# Patient Record
Sex: Female | Born: 1955 | Race: White | Hispanic: No | Marital: Married | Smoking: Former smoker
Health system: Southern US, Community
[De-identification: ages and names within clinical notes are randomized; demographics above are authoritative.]

## PROBLEM LIST (undated history)

## (undated) ENCOUNTER — Emergency Department (HOSPITAL_COMMUNITY): Disposition: A | Discharge status: Home / Self Care | Payer: BLUE CROSS/BLUE SHIELD

## (undated) DIAGNOSIS — E785 Hyperlipidemia, unspecified: Secondary | ICD-10-CM

## (undated) DIAGNOSIS — Z9889 Other specified postprocedural states: Secondary | ICD-10-CM

## (undated) DIAGNOSIS — I739 Peripheral vascular disease, unspecified: Secondary | ICD-10-CM

## (undated) DIAGNOSIS — T7840XA Allergy, unspecified, initial encounter: Secondary | ICD-10-CM

## (undated) DIAGNOSIS — K219 Gastro-esophageal reflux disease without esophagitis: Secondary | ICD-10-CM

## (undated) DIAGNOSIS — Z954 Presence of other heart-valve replacement: Secondary | ICD-10-CM

## (undated) DIAGNOSIS — Z8489 Family history of other specified conditions: Secondary | ICD-10-CM

## (undated) DIAGNOSIS — Z8701 Personal history of pneumonia (recurrent): Secondary | ICD-10-CM

## (undated) DIAGNOSIS — I350 Nonrheumatic aortic (valve) stenosis: Secondary | ICD-10-CM

## (undated) DIAGNOSIS — M1990 Unspecified osteoarthritis, unspecified site: Secondary | ICD-10-CM

## (undated) DIAGNOSIS — M6281 Muscle weakness (generalized): Secondary | ICD-10-CM

## (undated) DIAGNOSIS — I509 Heart failure, unspecified: Secondary | ICD-10-CM

## (undated) DIAGNOSIS — I10 Essential (primary) hypertension: Secondary | ICD-10-CM

## (undated) DIAGNOSIS — F419 Anxiety disorder, unspecified: Secondary | ICD-10-CM

## (undated) DIAGNOSIS — G4730 Sleep apnea, unspecified: Secondary | ICD-10-CM

## (undated) DIAGNOSIS — G4700 Insomnia, unspecified: Secondary | ICD-10-CM

## (undated) DIAGNOSIS — I351 Nonrheumatic aortic (valve) insufficiency: Secondary | ICD-10-CM

## (undated) DIAGNOSIS — Q230 Congenital stenosis of aortic valve: Secondary | ICD-10-CM

## (undated) DIAGNOSIS — I82409 Acute embolism and thrombosis of unspecified deep veins of unspecified lower extremity: Secondary | ICD-10-CM

## (undated) DIAGNOSIS — K559 Vascular disorder of intestine, unspecified: Secondary | ICD-10-CM

## (undated) HISTORY — DX: Muscle weakness (generalized): M62.81

## (undated) HISTORY — DX: Hyperlipidemia, unspecified: E78.5

## (undated) HISTORY — DX: Insomnia, unspecified: G47.00

## (undated) HISTORY — DX: Peripheral vascular disease, unspecified: I73.9

## (undated) HISTORY — DX: Essential (primary) hypertension: I10

## (undated) HISTORY — PX: CHOLECYSTECTOMY: SHX55

## (undated) HISTORY — DX: Gastro-esophageal reflux disease without esophagitis: K21.9

## (undated) HISTORY — DX: Acute embolism and thrombosis of unspecified deep veins of unspecified lower extremity: I82.409

## (undated) HISTORY — DX: Congenital stenosis of aortic valve: Q23.0

## (undated) HISTORY — DX: Nonrheumatic aortic (valve) stenosis: I35.0

## (undated) HISTORY — PX: CERVICAL FUSION: SHX112

## (undated) HISTORY — DX: Nonrheumatic aortic (valve) insufficiency: I35.1

## (undated) HISTORY — PX: ROTATOR CUFF REPAIR: SHX139

## (undated) HISTORY — DX: Heart failure, unspecified: I50.9

## (undated) HISTORY — DX: Allergy, unspecified, initial encounter: T78.40XA

---
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
## 2006-05-30 ENCOUNTER — Ambulatory Visit: Payer: Self-pay | Admitting: Vascular Surgery

## 2006-11-27 ENCOUNTER — Ambulatory Visit: Payer: Self-pay | Admitting: Vascular Surgery

## 2007-12-10 ENCOUNTER — Ambulatory Visit: Payer: Self-pay | Admitting: Vascular Surgery

## 2007-12-24 HISTORY — PX: ILIAC ARTERY STENT: SHX1786

## 2008-01-13 ENCOUNTER — Ambulatory Visit: Payer: Self-pay | Admitting: Vascular Surgery

## 2008-01-13 ENCOUNTER — Ambulatory Visit (HOSPITAL_COMMUNITY): Admission: RE | Admit: 2008-01-13 | Discharge: 2008-01-13 | Payer: Self-pay | Admitting: Vascular Surgery

## 2008-02-04 ENCOUNTER — Ambulatory Visit: Payer: Self-pay | Admitting: Vascular Surgery

## 2008-08-18 ENCOUNTER — Ambulatory Visit: Payer: Self-pay | Admitting: Vascular Surgery

## 2009-02-18 ENCOUNTER — Ambulatory Visit: Payer: Self-pay | Admitting: Vascular Surgery

## 2009-06-04 ENCOUNTER — Ambulatory Visit: Payer: Self-pay | Admitting: Cardiology

## 2009-06-07 ENCOUNTER — Encounter: Payer: Self-pay | Admitting: Cardiology

## 2009-09-09 ENCOUNTER — Ambulatory Visit: Payer: Self-pay | Admitting: Vascular Surgery

## 2009-10-20 ENCOUNTER — Ambulatory Visit (HOSPITAL_COMMUNITY): Admission: RE | Admit: 2009-10-20 | Discharge: 2009-10-20 | Payer: Self-pay | Admitting: General Surgery

## 2009-11-18 ENCOUNTER — Encounter (HOSPITAL_COMMUNITY): Admission: RE | Admit: 2009-11-18 | Discharge: 2009-12-18 | Payer: Self-pay | Admitting: General Surgery

## 2009-12-08 ENCOUNTER — Telehealth (INDEPENDENT_AMBULATORY_CARE_PROVIDER_SITE_OTHER): Payer: Self-pay

## 2009-12-08 ENCOUNTER — Encounter (INDEPENDENT_AMBULATORY_CARE_PROVIDER_SITE_OTHER): Payer: Self-pay

## 2009-12-09 ENCOUNTER — Telehealth (INDEPENDENT_AMBULATORY_CARE_PROVIDER_SITE_OTHER): Payer: Self-pay

## 2009-12-10 ENCOUNTER — Ambulatory Visit: Payer: Self-pay | Admitting: Internal Medicine

## 2009-12-10 ENCOUNTER — Ambulatory Visit (HOSPITAL_COMMUNITY): Admission: RE | Admit: 2009-12-10 | Discharge: 2009-12-10 | Payer: Self-pay | Admitting: Internal Medicine

## 2009-12-28 ENCOUNTER — Ambulatory Visit: Payer: Self-pay | Admitting: Internal Medicine

## 2009-12-28 ENCOUNTER — Encounter (INDEPENDENT_AMBULATORY_CARE_PROVIDER_SITE_OTHER): Payer: Self-pay

## 2009-12-28 DIAGNOSIS — K5900 Constipation, unspecified: Secondary | ICD-10-CM | POA: Insufficient documentation

## 2009-12-28 DIAGNOSIS — R112 Nausea with vomiting, unspecified: Secondary | ICD-10-CM | POA: Insufficient documentation

## 2009-12-28 DIAGNOSIS — R197 Diarrhea, unspecified: Secondary | ICD-10-CM | POA: Insufficient documentation

## 2009-12-29 ENCOUNTER — Encounter: Payer: Self-pay | Admitting: Internal Medicine

## 2009-12-29 ENCOUNTER — Telehealth (INDEPENDENT_AMBULATORY_CARE_PROVIDER_SITE_OTHER): Payer: Self-pay

## 2009-12-30 ENCOUNTER — Encounter: Payer: Self-pay | Admitting: Internal Medicine

## 2010-01-07 ENCOUNTER — Telehealth (INDEPENDENT_AMBULATORY_CARE_PROVIDER_SITE_OTHER): Payer: Self-pay

## 2010-01-14 LAB — CONVERTED CEMR LAB
ALT: 10 units/L (ref 0–35)
AST: 13 units/L (ref 0–37)
Albumin: 3.7 g/dL (ref 3.5–5.2)
Alkaline Phosphatase: 41 units/L (ref 39–117)
Basophils Relative: 1 % (ref 0–1)
HCT: 35.7 % — ABNORMAL LOW (ref 36.0–46.0)
Hemoglobin: 11.4 g/dL — ABNORMAL LOW (ref 12.0–15.0)
Lymphs Abs: 2.3 10*3/uL (ref 0.7–4.0)
MCHC: 31.9 g/dL (ref 30.0–36.0)
MCV: 85.6 fL (ref 78.0–100.0)
Monocytes Absolute: 0.7 10*3/uL (ref 0.1–1.0)
Neutrophils Relative %: 52 % (ref 43–77)
Platelets: 313 10*3/uL (ref 150–400)

## 2010-05-24 NOTE — Letter (Signed)
Summary: Out of Work Note  [facility]  [address]   [place], [place] [address]   Phone: [phone]  Fax: [phone]    12/28/2009  TO: WHOM IT MAY CONCERN  RE: Michelle Patton [address] [place] 18-Feb-1956       The above named individual had a procedure done on 8/19/2011and is currently under my care and will be out of work:    FROM: 12/28/2009   THROUGH: 12/29/2009    MAY RETURN ON:12/30/2009     If you have any further questions or need additional information, please call.     Sincerely,     [facility] R. Roetta Sessions, M.D.    Jonette Eva, M.D. Lorenza Burton, FNP-BC    Tana Coast, PA-C Phone: [phone]    Fax: [phone]

## 2010-05-24 NOTE — Miscellaneous (Signed)
Summary: Orders Update  Clinical Lists Changes  Problems: Added new problem of NAUSEA (ICD-787.02) Orders: Added new Test order of T-Hepatic Function [phone]) - Signed Added new Test order of T-CBC w/Diff [phone]) - Signed Added new Test order of T-Lipase [phone]) - Signed

## 2010-05-24 NOTE — Progress Notes (Signed)
Summary: instructions for procedure  ---- Converted from flag ---- ---- 12/09/2009 11:03 AM, Jonathon Bellows MD, Caleen Essex wrote: no solid food after 8 pm - night before; may have all the clear liquids she wants until 8 am , morning of procedure; then nothing until EGD  ---- 12/09/2009 9:52 AM, Hendricks Limes LPN wrote: this pt is scheduled for friday for egd at 130pm. pt stated she goes to bed around 830pm.If shes NPO from her normal bedtime untill procedure, that is 17 hours. Does she need to be NPO that long and  How many hours does she need to be NPO prior to egd?  thanks ------------------------------

## 2010-05-24 NOTE — Letter (Signed)
Summary: RADIOLOGY REPORT U/S GALLBLADDER  RADIOLOGY REPORT U/S GALLBLADDER   Imported By: Rexene Alberts 12/29/2009 11:30:24  _____________________________________________________________________  External Attachment:    Type:   Image     Comment:   External Document  Appended Document: RADIOLOGY REPORT U/S GALLBLADDER noted; if nausea persists; need lipase lfts and cbc done now  Appended Document: RADIOLOGY REPORT U/S GALLBLADDER Pt is still having nausea. She will go for the labs. Lab order being faxed to [facility]

## 2010-05-24 NOTE — Progress Notes (Signed)
Summary: aciphex refill  Phone Note Call from Patient Call back at Home Phone [phone]   Caller: Patient Summary of Call: pt wants refill on aciphex sent to [facility] Initial call taken by: Hendricks Limes LPN,  January 07, 2010 11:26 AM     Appended Document: aciphex refill    Prescriptions: ACIPHEX 20 MG TBEC (RABEPRAZOLE SODIUM) once daily  #30 x 11   Entered and Authorized by:   Leanna Battles. Dixon Boos   Signed by:   Leanna Battles Dixon Boos on 01/07/2010   Method used:   Electronically to        [facility] (retail)       [address]       [place], [place]  [address]       Ph: [phone]       Fax: [phone]   RxID:   [id number]

## 2010-05-24 NOTE — Miscellaneous (Signed)
Summary: HIDA scan, H&P, op note and labs from aph  Clinical Lists Changes NM Hepatobiliary Liver Function - STATUS: Final  IMAGE                                     Perform Date: 28Jul11 11:41  Ordered By: Carmelia Bake,          Ordered Date: 28Jul11 10:05  Facility: [facility]                               Department: NM  Service Report Text  [facility] Accession Number: [id number]      Clinical Data:  History given of previous laparoscopic   cholecystectomy.  Evaluation for possible biliary leakage.    NUCLEAR MEDICINE HEPATOBILIARY IMAGING    Technique:  Sequential images of the abdomen were obtained out to   60 minutes following intravenous administration of   radiopharmaceutical.    Radiopharmaceutical:  5.4 mCi Tc-43m Choletec    Comparison: None.    Findings: There is prompt visualization of hepatic activity.  There   was no accumulation of activity within the gallbladder fossa.   There is no evidence of biliary leakage.  Bile ducts were   demonstrated promptly.  Activity is seen within the intestine   indicating patency of the common bile duct.    IMPRESSION:   Status post cholecystectomy.  No evidence of biliary leak or   biliary obstruction.    Read By:  Crawford Givens,  M.D.   Released By:  Crawford Givens,  M.D.  Additional Information  HL7 RESULT STATUS : F  External image : [id number]  External IF Update Timestamp : 2009-11-18:12:19:51.000000     NAME:  Michelle Patton, Michelle Patton                 ACCOUNT NO.:  [id number]      MEDICAL RECORD NO.:  [id number]          PATIENT TYPE:  AMB      LOCATION:  DAY                           FACILITY:  [facility]      PHYSICIAN:  Dalia Heading, M.D.  DATE OF BIRTH:  [birth year]      DATE OF ADMISSION:   DATE OF DISCHARGE:  LH                                 HISTORY      CHIEF COMPLAINT:  Cholecystitis, cholelithiasis.      HISTORY OF PRESENT ILLNESS:  The patient is a 55 year old white female   who is referred for evaluation and  treatment of biliary colic secondary   to cholelithiasis.  She has been having intermittent right upper   quadrant abdominal pain with radiation to the flank, nausea, and   indigestion for the past few months.  It is made worse with fatty foods.   No fever, chills, or jaundice have been noted.      PAST MEDICAL HISTORY:  Hypertension.      PAST SURGICAL HISTORY:  CABG in 1968, knee repair in 1995, rotator cuff   repair bilaterally, cervical fusion in 2001.      CURRENT MEDICATIONS:  Crestor, hormone-replacement therapy, benazepril 1   tablet p.o. q.h.s.      ALLERGIES:  PENICILLIN.      REVIEW OF SYSTEMS:  The patient denies smoking.  She drinks alcohol   socially.      She denies any recent chest pain, MI, CVA, or diabetes mellitus.      PHYSICAL EXAMINATION:  The patient is a well-developed, well-nourished   white female in no acute distress.   HEENT: Examination reveals no scleral icterus.   LUNGS:  Clear to auscultation with equal breath sounds bilaterally.   HEART:  Examination reveals a regular rate and rhythm without S3, S4, or   murmurs.   ABDOMEN:  Soft and nondistended.  She is slightly tender in the right   upper quadrant to palpation.  No hepatosplenomegaly, masses, or hernias   are identified.      Ultrasound of the gallbladder reveals cholelithiasis with a normal   common bile duct.      IMPRESSION:  Cholecystitis, cholelithiasis.      PLAN:  The patient will be scheduled for a laparoscopic cholecystectomy   on October 20, 2009.  The risks and benefits of the procedure including   bleeding, infection, hepatobiliary, and the possibility of an open   procedure were fully explained to the patient, who gave informed   consent.               Dalia Heading, M.D.            MAJ/MEDQ  D:  10/19/2009  T:  10/19/2009  Job:  [id number]      cc:   [facility]   Fax: [phone]      Samuel Jester   Fax: [phone]      Electronically Signed by Franky Macho  M.D. on 10/20/2009 09:39:36 AM      NAME:  Michelle Patton                 ACCOUNT NO.:  [id number]      MEDICAL RECORD NO.:  [id number]          PATIENT TYPE:  AMB      LOCATION:  DAY                           FACILITY:  [facility]      PHYSICIAN:  Dalia Heading, M.D.  DATE OF BIRTH:  1955/11/14      DATE OF PROCEDURE:  10/20/2009   DATE OF DISCHARGE:                                 OPERATIVE REPORT     PREOPERATIVE DIAGNOSIS:  Cholecystitis, cholelithiasis.      POSTOPERATIVE DIAGNOSIS:  Cholecystitis, cholelithiasis.      PROCEDURE:  Laparoscopic cholecystectomy.      SURGEON:  Dalia Heading, MD      ANESTHESIA:  General endotracheal.      INDICATIONS:  The patient is a 55 year old white female who presents   with biliary colic secondary to cholelithiasis.  The risks and benefits   of the procedure including bleeding, infection, hepatobiliary, the   possibly of an open procedure were fully explained to the patient, gave   informed consent.      PROCEDURE NOTE:  The patient was placed in supine position.  After   induction of general endotracheal anesthesia, the  abdomen was prepped   and draped using the usual sterile technique with a DuraPrep.  Surgical   site confirmation was performed.      A supraumbilical incision was made down to the fascia.  A Veress needle   was introduced into the abdominal cavity and confirmation of placement   was done using the saline drop test.  The abdomen was then insufflated   to 16 mmHg pressure.  An 11-mm trocar was introduced into the abdominal   cavity under direct visualization without difficulty.  The patient was   placed in reversed Trendelenburg position.  Additional 11-mm trocar was   placed in the epigastric region and 5-mm trocars were placed in the   right upper quadrant, right flank regions.  Liver was inspected and   noted to be within normal limits.  The gallbladder was retracted   superior and laterally.  Dissection was begun  around the infundibulum of   the gallbladder.  The cystic duct was first identified.  The juncture to   the infundibulum fully identified.  Endoclips placed proximally and   distally on the cystic duct and cystic duct was divided.  This was   likewise done the cystic artery.  Gallbladder was then freed away from   the gallbladder fossa using Bovie electrocautery.  The gallbladder   delivered through the epigastric trocar site using EndoCatch bag.  The   gallbladder fossa was inspected and no abnormal bleeding or bile leakage   was noted.  Surgicel was placed in the gallbladder fossa.  All fluid and   air were then evacuated from the abdominal cavity prior to removal of   the trocars.      All wounds were irrigated with normal saline.  All wounds were injected   with 0.5% Sensorcaine.  The supraumbilical fascia was reapproximated   using an 0 Vicryl interrupted suture.  All skin incisions were closed   using staples.  Betadine ointment and dry sterile dressings were   applied.      All tape and needle counts correct at the end of the procedure.  The   patient was extubated in the operating room and went back to recovery   room awake in stable condition.      COMPLICATIONS:  None.      SPECIMEN:  Gallbladder.      BLOOD LOSS:  Minimal.               Dalia Heading, M.D.            MAJ/MEDQ  D:  10/20/2009  T:  10/20/2009  Job:  [id number]      cc:   [facility]   Fax: [phone]      Electronically Signed by Franky Macho M.D. on 10/26/2009 08:13:57 PM    L-Hepatic Function Panel (HFP / LFT) - STATUS: Final                                            Perform Date: 28Jun11 14:47  Ordered By: Carmelia Bake,          Ordered Date: [phone] 14:48                                       Last Updated  Date: 28Jun11 16:25  Facility: [facility]                               Department: GENL  Accession #: [id number]                   USN:       [id number]  Findings   Result Name                              Result     Abnl   Normal Range     Units      Perf. Loc.  Bilirubin, Total                               0.4               0.3-1.2          mg/dL  Bilirubin, Direct                              0.1               0.0-0.3          mg/dL  Indirect Bilirubin                            0.3               0.3-0.9          mg/dL  Alkaline Phosphatase                    38         l      39-117           U/L  SGOT (AST)                                 19                0-37             U/L  SGPT (ALT)                                 15                0-35             U/L  Total  Protein                                6.7               6.0-8.3          g/dL  Albumin-Blood                              4.1               3.5-5.2          g/dL  Additional Information  HL7  RESULT STATUS : F  External IF Update Timestamp : 2009-10-19:16:21:00.000000   L-BMP/BMET (Basic Metabolic Panel) - STATUS: Final                                            Perform Date: 28Jun11 14:47  Ordered By: Carmelia Bake,          Ordered Date: [phone] 14:48                                       Last Updated Date: 28Jun11 16:25  Facility: [facility]                               Department: GENL  Accession #: [id number]                   USN:       [id number]  Findings  Result Name                              Result     Abnl   Normal Range     Units      Perf. Loc.  Sodium (NA)                              138               135-145          mEq/L  Potassium (K)                            4.0               3.5-5.1          mEq/L  Chloride                                    104               96-112           mEq/L  CO2                                           27                19-32            mEq/L  Glucose                                     86                70-99            mg/dL  BUN  11                6-23             mg/dL   Creatinine                                 1.02              0.4-1.2          mg/dL  GFR, Est Non African American    57         l      >60              mL/min  GFR, Est African American         >60               >60              mL/min    Oversized comment, see footnote  1  Calcium                                       9.7               8.4-10.5         mg/dL  Footnotes  1. The eGFR has been calculated     using the MDRD equation.     This calculation has not been     validated in all clinical     situations.     eGFR's persistently     <60 mL/min signify     possible Chronic Kidney Disease.  Additional Information  HL7 RESULT STATUS : F  External IF Update Timestamp : 2009-10-19:16:21:00.000000    L-CBC-NO Differential - STATUS: Final                                            Perform Date: 28Jun11 14:47  Ordered By: Carmelia Bake,          Ordered Date: [phone] 14:48                                       Last Updated Date: 28Jun11 15:56  Facility: [facility]                               Department: GENL  Accession #: [id number]                   USN:       [id number]  Findings  Result Name                              Result     Abnl   Normal Range     Units      Perf. Loc.  WBC  9.8               4.0-10.5         K/uL  RBC                                           3.90              3.87-5.11        MIL/uL  Hemoglobin (HGB)                       12.0              12.0-15.0        g/dL  Hematocrit (HCT)                         35.5       l      36.0-46.0        %  MCV                                           90.9              78.0-100.0       fL  MCH -                                         30.8              26.0-34.0        pg  MCHC                                        33.9              30.0-36.0        g/dL  RDW                                         12.6              11.5-15.5        %  Platelet Count (PLT)                     299               150-400          K/uL  Additional Information  HL7 RESULT STATUS : F  External IF Update Timestamp : 2009-10-19:15:52:00.000000

## 2010-05-24 NOTE — Assessment & Plan Note (Signed)
Summary: fu on egd on 12-10-09/jbb   Visit Type:  Follow-up Visit Primary Care Provider:  Aram Beecham butler  Chief Complaint:  follow up from egd, still has burning, and belching and constipation.  History of Present Illness: 55 year old lady with progressive weight gain and reflux symptoms;  recent EGD by me demonstrated a Schatzki's ring which  was dilated. Failed other proton pump inhibitors in the past. Recently on  AcipHex 20 mg orally daily. Reflux some better; she continues to belch and had intermittent nausea and vomiting; She says she goes to the "cookout" and gets 4 vanilla milkshakes and eats them over 24 hours and tolerates them OK. Weigth 30 pounds up in several months.  May go up to a week or so wo a BM; states MiraLax 17 g orally daily does not help with bowel function.   7 years since  last had a colonoscopy. Dysphagia symptoms have improved. Got gallbladder out earlier this year - helped with biliary symptoms but not current problems.  Preventive Screening-Counseling & Management  Alcohol-Tobacco     Smoking Status: quit  Current Medications (verified): 1)  Aciphex 20 Mg Tbec (Rabeprazole Sodium) .... Once Daily 2)  Promethazine Hcl 25 Mg Tabs (Promethazine Hcl) .... As Needed 3)  Zocor 40 Mg Tabs (Simvastatin) .... Once Daily 4)  Ambien 10 Mg Tabs (Zolpidem Tartrate) .... At Bedtime 5)  Estratest .... Once Daily 6)  Benazepril Hcl 20 Mg Tabs (Benazepril Hcl) .... Once Daily  Allergies (verified): 1)  ! Pcn  Past History:  Past Medical History: Hyperlipidemia Hypertension schatchki's ring gerd dyspepsia hiatal hernia  Past Surgical History: open heart surgery knee spinal fusion rotator cuff x 2 endometrial ablasion gallbladder egd 12/10/2009  Family History: Father: alive- heart dz,MI Mother: alive- htn Siblings: 1 sister No FH of Colon Cancer:  Social History: Marital Status: no Children: no Occupation: [employer] Patient is a former smoker.    Alcohol Use - yes on occ. Smoking Status:  quit  Vital Signs:  Patient profile:   55 year old female Height:      66 inches Weight:      220 pounds BMI:     35.64 Temp:     97.5 degrees F oral Pulse rate:   76 / minute BP sitting:   134 / 92  (left arm) Cuff size:   regular  Vitals Entered By: Hendricks Limes LPN (December 28, 2009 4:37 PM)  Physical Exam  General:  alert conversant in no acute distress Abdomen:  obese positive bowel sounds soft nontender without mass or organomegaly  Impression & Recommendations: Impression: A 55 year old lady with significant weight gain multiple GI symptoms in a setting of progressive weight gain. She did have mild erosive  reflux esophagitis on EGD. Dysphagia has resolved status post dilation of a Schatzki's ring.  Cholecystectomy did help her biliary symptoms; need to sort out what symptoms, if any , due to constipation   Recommendations:  Golytely purge later today; telephone f/u as to how she feels post purge; further recommendations to follow.  Appended Document: Orders Update    Clinical Lists Changes  Problems: Added new problem of NAUSEA AND VOMITING (ICD-787.01) Added new problem of DIARRHEA (ICD-787.91) Added new problem of CONSTIPATION (ICD-564.00) Removed problem of NAUSEA (ICD-787.02) Orders: Added new Service order of Est. Patient Level IV (96295) - Signed

## 2010-05-24 NOTE — Progress Notes (Signed)
Summary: dysphagia  Phone Note Call from Patient Call back at [phone]   Caller: Patient Summary of Call: pt called- She had gallbladder surgery on 11/19/2009 by Dr. Lovell Sheehan. She is now complaining of dysphagia, sore throat, belching, burning in her esophagus and it feels like her stomach is on fire. pt has only had these symptoms since removal of gallbladder. She is taking Dexilant and it only seems to help some. pt has ov on Sep 6th. pt wants to be seen sooner or have egd done.  RMR spoke with Dr. Charm Barges about this pt. please advise. Initial call taken by: Hendricks Limes LPN,  December 08, 2009 9:37 AM     Appended Document: dysphagia as above patient needs upper endoscopic evaluation. To expedite her evaluation, lets go ahead and triage her to set up a diagnostic EGD plus minus dilation within the next week.  Appended Document: dysphagia pt scheduled for egd/poss ed on 12/10/09. pt aware

## 2010-05-24 NOTE — Progress Notes (Signed)
Summary: phone note/ results from Suprep for purge  Phone Note Call from Patient   Caller: Patient Summary of Call: Pt called and said she did 1/2 of the Suprep last night as instructed. Said she had nausea with vomiting and diarrhea until midnight, and her stools were running clear. Said she is sure she is cleaned out good. She is still nauseated this AM, but was supposed to do the remaining half of the Suprep starting at 9:30 this AM. I told her to hold off uintil we hear back from  Dr. Jena Gauss, who is at the hospital doing procedures. Initial call taken by: Cloria Spring LPN,  December 29, 2009 9:45 AM     Appended Document: phone note/ results from Suprep for purge Verbal from Dr. Jena Gauss, OK not to complete the Suprep, pt seems to be cleaned out per her information. I called pt to let her know that. She states she still has some nausea this AM, is afraid to eat just yet. I advised to just do clear liquids and gradually add some soft foods. She said she has gained about 30 lbs in the last 6 months from having the daily shakes. Soledad Gerlach is getting [place] report from [facility]).  Appended Document: phone note/ results from Suprep for purge need prior US ressults; Is nausea better now that she's purged and over prep?   Appended Document: phone note/ results from Suprep for purge [place] in EMR now. Pt still had a little nausea....see the other append.

## 2010-07-11 LAB — SURGICAL PCR SCREEN
MRSA, PCR: NEGATIVE
Staphylococcus aureus: NEGATIVE

## 2010-07-11 LAB — BASIC METABOLIC PANEL WITH GFR
BUN: 11 mg/dL (ref 6–23)
CO2: 27 meq/L (ref 19–32)
Calcium: 9.7 mg/dL (ref 8.4–10.5)
Chloride: 104 meq/L (ref 96–112)
Creatinine, Ser: 1.02 mg/dL (ref 0.4–1.2)
GFR calc non Af Amer: 57 mL/min — ABNORMAL LOW
Glucose, Bld: 86 mg/dL (ref 70–99)
Potassium: 4 meq/L (ref 3.5–5.1)
Sodium: 138 meq/L (ref 135–145)

## 2010-07-11 LAB — CBC
HCT: 35.5 % — ABNORMAL LOW (ref 36.0–46.0)
Hemoglobin: 12 g/dL (ref 12.0–15.0)
MCH: 30.8 pg (ref 26.0–34.0)
MCHC: 33.9 g/dL (ref 30.0–36.0)
MCV: 90.9 fL (ref 78.0–100.0)
Platelets: 299 K/uL (ref 150–400)
RBC: 3.9 MIL/uL (ref 3.87–5.11)
RDW: 12.6 % (ref 11.5–15.5)
WBC: 9.8 K/uL (ref 4.0–10.5)

## 2010-07-11 LAB — HEPATIC FUNCTION PANEL
ALT: 15 U/L (ref 0–35)
Albumin: 4.1 g/dL (ref 3.5–5.2)
Alkaline Phosphatase: 38 U/L — ABNORMAL LOW (ref 39–117)
Bilirubin, Direct: 0.1 mg/dL (ref 0.0–0.3)
Indirect Bilirubin: 0.3 mg/dL (ref 0.3–0.9)
Total Bilirubin: 0.4 mg/dL (ref 0.3–1.2)
Total Protein: 6.7 g/dL (ref 6.0–8.3)

## 2010-09-06 NOTE — Assessment & Plan Note (Signed)
OFFICE VISIT   Michelle Patton, Michelle Patton  DOB:  1955-12-24                                       09/09/2009  ZOXWR#:[id number]   I saw the patient in the office today for continued followup of her  peripheral vascular disease.  She had previously presented with left  lower extremity claudication.  She underwent PTA and stenting of the  left external iliac artery stenosis back in September of 2009.  Her  symptoms resolved and she had been doing well.  I last saw her in  October of 2009 and then she was lost to followup.  She states that her  symptoms in the left leg completely resolved after her stent and she was  not having any problems until approximately 2 weeks ago she began  developing some cramps in her legs and feet at night which was more  significant on the left side.  She did not describe any symptoms of  claudication in the thigh or calf.  She has had no history of nonhealing  wounds.  She wished to have her vascular status evaluated given her  previous stenting.   Of note, she has had some recent chest pain recently and apparently was  in the hospital where she underwent an extensive workup which was  unremarkable but she does describe occasional chest pressure and  indigestion and also occasional pain in the left arm.  She was seen by  Dr. Myrtis Ser in the hospital.  In addition, she has a history of  hypertension and hypercholesterolemia.  She had previous open heart  surgery in 1968.  She is unaware of any history of congestive heart  failure, diabetes or COPD.   SOCIAL HISTORY:  She is single.  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  She has had a long history of tobacco use but  quit in February of this year.   REVIEW OF SYSTEMS:  CARDIOVASCULAR:  She continues to have some  occasional chest pressure.  She also has occasional palpitations.  She  has had no significant orthopnea or dyspnea on exertion.  She has had no  history  of stroke, TIAs or amaurosis fugax.  She has had no history of  DVT or phlebitis.  PULMONARY:  She has an occasional productive cough.  She has had no  asthma or wheezing.  NEUROLOGICAL:  She has had no dizziness, blackouts, headaches or  seizures.   PHYSICAL EXAMINATION:  General:  This is a pleasant 55 year old woman  who appears her stated age.  Vital signs:  Blood pressure is 120/80,  temperature is 98, heart rate is 85.  HEENT:  Unremarkable.  Lungs:  Are  clear bilaterally to auscultation without rales or wheezing.  Cardiovascular:  I do not detect any carotid bruits.  She has a regular  rate and rhythm.  She has normal femoral pulses and palpable popliteal  and pedal pulses bilaterally.  Both feet are warm and well-perfused.  There is no evidence of atheroembolic disease.  She has no significant  lower extremity swelling.  Abdomen:  Soft and nontender with normal  pitched bowel sounds.  Musculoskeletal:  There are no major deformities  or cyanosis.  Neurologic:  She has no focal weakness or paresthesias.   I did independently interpret her arterial Doppler study today which  shows ABIs of 100% bilaterally with triphasic Doppler signals noted in  the posterior tibial and anterior tibial positions bilaterally.   I have reassured her that I do not think her leg symptoms are related to  her peripheral vascular disease.  She has palpable pulses and normal  Doppler signals with normal ABIs.  I have asked to see her back in 1  year with ABIs and a duplex of her left iliac artery stent.  I have  encouraged her to stay off the cigarettes and also to do as much walking  as possible.  I have also encouraged her to call follow up with Dr. Myrtis Ser  with respect to her continued chest discomfort at times.  She also has  some problems with indigestion and if her cardiac workup is completely  unremarkable perhaps a GI evaluation would be helpful.  I will see her  back in 1 year.  She knows to  call sooner if she has problems.     Di Kindle. Edilia Bo, M.D.  Electronically Signed   CSD/MEDQ  D:  09/09/2009  T:  09/10/2009  Job:  [id number]   cc:   Ewing Schlein, MD, [facility]

## 2010-09-06 NOTE — Assessment & Plan Note (Signed)
OFFICE VISIT   Michelle Patton, Michelle Patton  DOB:  September 28, 1955                                       11/27/2006  ZOXWR#:[id number]   I saw the patient in the office today for continued followup of her  peripheral vascular disease.  I had originally seen her in consultation  in August of 2007 with left lower extremity claudication.  Based on her  exam at that time, I thought she had evidence of iliac artery occlusive  disease on the left.  We discussed the importance of tobacco cessation  and a structured walking program, and she thought her symptoms were  tolerable.  We did not pursue arteriography.  She comes in today for a  followup visit.  Her main complaint is pain in her left calf, which  occurs at night.  She has stable claudication involving the left hip,  thigh, and calf, which occurs at approximately half a block.  This has  not changed over the last few years.  She denies any history of rest  pain in her foot.  She has had no history of non-healing wounds.   REVIEW OF SYSTEMS:  She has had no chest pain, chest pressure,  palpitations, or arrhythmias.  She has occasional heartburn.  She does admit to some dyspnea on exertion.   SOCIAL HISTORY:  She does continue to smoke a pack per day of  cigarettes.   PHYSICAL EXAMINATION:  Blood pressure is 138/81, heart rate is 75.  I do  not detect any carotid bruits.  Lungs are clear bilaterally to  auscultation.  On cardiac exam, she has a systolic murmur.  Abdomen is  soft and nontender.  She has a palpable right femoral pulse with a  slightly diminished left femoral pulse.  She has palpable right  popliteal dorsalis pedis and posterior tibial pulse.  She has a  diminished, but palpable, dorsalis pedis and posterior tibial pulse on  the left.  Both of these appear adequately perfused.   Doppler study in our office today shows an ABI of 100% on the right and  88% on the left, which is actually improved compared to the  study a year  ago.   I explained to her I did not think her calf pain was related to her  peripheral vascular disease.  Typically rest pain occurs in the foot.  It sounds like she is having some cramps in her calf at night, and we  discussed potentially using quinine.  Her peripheral vascular disease  remains stable, and we have again discussed the importance of tobacco  cessation.  I plan on seeing her back in 1 year with followup ABI.  She  knows to call sooner if she has problems.   Di Kindle. Edilia Bo, M.D.  Electronically Signed   CSD/MEDQ  D:  11/27/2006  T:  11/28/2006  Job:  [id number]

## 2010-09-06 NOTE — Assessment & Plan Note (Signed)
OFFICE VISIT   [name], [name]  DOB:  [birth year]                                       02/04/2008  [id number]#:[id number]   I saw the patient in the office today for followup after her recent left  external iliac artery angioplasty.  This a pleasant 55 year old woman I  have been following with left lower extremity claudication.  Symptoms  progressed and she wished to pursue arteriography and possible  angioplasty.  She underwent a PTA and stent of the left external iliac  artery using a [id number] stent.  This was a post-dilatation with an 8 mm x 2  cm balloon.  She comes in for routine followup visit.  She states her  claudication symptoms of the left leg have resolved.  She has been  ambulating without any problems.   I plan to leave her on Plavix for 6 months.  Unfortunately, she does  continue to smoke.   PHYSICAL EXAMINATION:  Blood pressure is 122/82, heart rate is 85.  She  has palpable femoral pulses.  She has palpable pedal pulses.   Doppler study in our office today shows biphasic Doppler signals in both  feet with ABIs 100% bilaterally.   I plan to leave her on Plavix for 6 months.  I have encouraged her to do  as much ambulating as possible.  We also discussed the importance of  tobacco cessation.  I plan on seeing her back in 6 months.  She knows to  call sooner if she has problems.   Michelle Patton. Michelle Patton, M.D.  Electronically Signed   CSD/MEDQ  D:  02/04/2008  T:  02/05/2008  Job:  [id number]

## 2010-09-06 NOTE — Assessment & Plan Note (Signed)
OFFICE VISIT   KIRSTI, MCALPINE  DOB:  Sep 01, 1955                                       12/10/2007  YQMVH#:[id number]   I saw the patient in the office today for continued followup of her left  lower extremity claudication.  I last saw her a year ago.  Since I saw  her a year ago her symptoms in the left leg have gradually progressed.  She experiences significant claudication symptoms involving the calf and  thigh and hip on the left side.  She has had no symptoms on the right  side.  She does describe some pain on the top of her foot at night  although I am not convinced that this is classic rest pain.  She has had  no history of nonhealing ulcers.  Her medical history has not changed  since I saw her last.   SOCIAL HISTORY:  She does continue to smoke a pack per day of  cigarettes.   REVIEW OF SYSTEMS:  She has had no recent chest pain, chest pressure,  palpitations or arrhythmias.  She has had no bronchitis, asthma or  wheezing.   PHYSICAL EXAMINATION:  General:  This is a pleasant 55 year old woman  who appears her stated age.  Vital signs:  Blood pressure is 136/90,  heart rate is 90.  Lungs:  Are clear bilaterally to auscultation.  Cardiac:  She has a regular rate and rhythm with a systolic murmur which  I believe is transmitted to her carotids.  Abdomen:  Soft and nontender.  She has palpable femoral pulses.  She has palpable dorsalis pedis and  posterior tibial pulses bilaterally.   She did have a Doppler study in our office today which showed an ABI of  100% on the right with biphasic Doppler signals in the dorsalis pedis  and posterior tibial position.  On the left side she is 90% with  biphasic posterior tibial and dorsalis pedis signals.  Of note, when she  did exercise in the office today her pressure on the left did drop  slightly.   I suspect she likely has some left iliac artery occlusive disease which  explains her claudication  symptoms.  I explained I did not think  ischemia could explain the symptoms she is having in her foot at night  as her resting ABI is 90% and she has palpable pulses.  However, this  has become more and more of a problem for her so I have therefore  recommended that we proceed with arteriography and also potential  angioplasty of the left iliac artery stenosis if this is identified.  We  have discussed the indications for arteriography and the potential  complications including but not limited to bleeding, arterial injury,  dye reaction and kidney failure.  In addition we have discussed the  potential complications of iliac angioplasty including the risk of  thrombosis, bleeding or dissection.  All of her questions were answered  and she is agreeable to proceed.  Her procedure has been scheduled for  12/23/2007.   Di Kindle. Edilia Bo, M.D.  Electronically Signed   CSD/MEDQ  D:  12/10/2007  T:  12/11/2007  Job:  [id number]

## 2010-09-06 NOTE — Op Note (Signed)
NAMECHEREESE, CILENTO                 ACCOUNT NO.:  [id number]   MEDICAL RECORD NO.:  [id number]          PATIENT TYPE:  AMB   LOCATION:  SDS                          FACILITY:  [facility]   PHYSICIAN:  Di Kindle. Edilia Bo, M.D.DATE OF BIRTH:  1955-12-16   DATE OF PROCEDURE:  01/13/2008  DATE OF DISCHARGE:  01/13/2008                               OPERATIVE REPORT   PREOPERATIVE DIAGNOSIS:  Left iliac artery stenosis.   POSTOPERATIVE DIAGNOSIS:  Left external iliac artery stenosis.   PROCEDURE:  1. Ultrasound-guided access to the left common femoral artery.  2. Aortogram with bilateral runoff.  3. Percutaneous transluminal angioplasty and stent of the left      external iliac artery with a PG 397 stent.  4. Post-stent dilatation with an 8 mm x 2 cm balloon.   INDICATIONS:  This is a 55 year old woman who I have been following with  left lower extremity claudication.  Ultimately, her symptoms became  disabling enough that she wished to proceed with arteriography and  possible revascularization.  She was brought in for an elective  arteriogram and possible left iliac stent.  The procedure and potential  complications had been discussed with the patient preoperatively.  All  of her questions were answered and she was agreeable to proceed.   TECHNIQUE:  The patient was taken to the PV lab at [facility] and sedated with  2 mg of Versed and 50 mcg of fentanyl.  Both groins were prepped and  draped in the usual sterile fashion.  Under ultrasound guidance and  after the skin was anesthetized with 1% lidocaine, the left common  femoral artery was cannulated and a guidewire introduced into the  infrarenal aorta under fluoroscopic control.  A 5-French sheath was  introduced over the wire.  Pigtail catheter was positioned at the L1  vertebral body and flush aortogram obtained.  The catheter was then  repositioned above the aortic bifurcation and oblique iliac projections  were obtained.  There was a  80-90% left external iliac artery stenosis,  which was identified.  Bilateral lower extremity runoff films had been  obtained and next the 5-French sheath was exchanged for a long 6-French  sheath.  The patient then received 3000 units of IV heparin.  The road  map was obtained with injection through the sheath on the left.  A PG  397 stent was positioned across the stenosis and deployed to 8  atmospheres for 30 seconds.  There was some mild residual stenosis at  the proximal stent.  I, therefore, did a post-stent dilatation with an 8  mm x 2 cm balloon within the stent.  At the completion, the sheath was  advanced through the stent using the dilator and then the sheath was  retracted to obtain a pullback pressure and there was no significant  gradient noted.  There were no immediate complications noted.  The  patient was transferred to recovery room in satisfactory condition.   FINDINGS:  There were single renal arteries bilaterally with no  significant renal artery stenosis identified.  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   Next, on the right side, the infrarenal aorta was widely patent with no  significant stenosis noted.  On the right side, the common iliac,  external iliac hypogastric, common femoral, superficial femoral, deep  femoral popliteal, and tibial vessels were all patent.  No significant  stenosis was identified.  On the left side, common femoral and  hypogastric arteries were patent.  There was an 80-90% external iliac  artery stenosis on the left, which was successfully ballooned and  stented.  The common femoral, superficial femoral, deep femoral,  popliteal and tibial vessels were all patent on the left.   FINDINGS:  1. 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   2. Large collaterals through the marginal artery of [place] from the      inferior mesenteric artery.  3. Successful ballooning stenting of left external iliac artery      stenosis.      Di Kindle. Edilia Bo, M.D.  Electronically Signed     CSD/MEDQ  D:  01/13/2008  T:  01/13/2008  Job:  [id number]

## 2010-09-21 ENCOUNTER — Other Ambulatory Visit: Payer: Self-pay

## 2011-01-23 LAB — POCT I-STAT, CHEM 8
Chloride: 107
Sodium: 139

## 2012-04-18 ENCOUNTER — Encounter: Payer: Self-pay | Admitting: Vascular Surgery

## 2012-05-09 ENCOUNTER — Telehealth: Payer: Self-pay | Admitting: Vascular Surgery

## 2012-05-09 ENCOUNTER — Telehealth: Payer: Self-pay

## 2012-05-09 NOTE — Telephone Encounter (Signed)
Pt. Called to get appt. For follow-up.  Stated she has been without insurance and hasn't been able to follow-up sooner.  C/o a sharp pain in left calf.  States the pain comes and goes, and occurs several times/night.  States this has been ongoing for several months.  States able to get up at night and "walk it out".   Reports that recently her "leg almost gave-out."  States that the discomfort in her calf is very similar to the symptoms she had prior to her stent placement in 2009.  Denies any swelling in the left leg.  States that the "calf feels like a rock at times."  Also reports the varicose veins in her left leg are getting worse.  Advised will call pt. to reschedule appt., if possible , sooner than 2/19.  Pt. Knows to call sooner, if symptoms worsen.

## 2012-05-09 NOTE — Telephone Encounter (Signed)
Left message for patient that is an appt. opens up prior to 06/12/12 we will call her.  Placed patient on wait list.

## 2012-05-31 ENCOUNTER — Other Ambulatory Visit: Payer: Self-pay | Admitting: *Deleted

## 2012-05-31 DIAGNOSIS — I739 Peripheral vascular disease, unspecified: Secondary | ICD-10-CM

## 2012-05-31 DIAGNOSIS — Z48812 Encounter for surgical aftercare following surgery on the circulatory system: Secondary | ICD-10-CM

## 2012-06-10 ENCOUNTER — Encounter: Payer: Self-pay | Admitting: Vascular Surgery

## 2012-06-11 ENCOUNTER — Encounter: Payer: Self-pay | Admitting: Vascular Surgery

## 2012-06-12 ENCOUNTER — Ambulatory Visit: Payer: Self-pay | Admitting: Vascular Surgery

## 2012-06-12 ENCOUNTER — Other Ambulatory Visit (INDEPENDENT_AMBULATORY_CARE_PROVIDER_SITE_OTHER): Payer: 59 | Admitting: *Deleted

## 2012-06-12 ENCOUNTER — Ambulatory Visit (INDEPENDENT_AMBULATORY_CARE_PROVIDER_SITE_OTHER): Payer: 59 | Admitting: Vascular Surgery

## 2012-06-12 ENCOUNTER — Encounter: Payer: Self-pay | Admitting: Vascular Surgery

## 2012-06-12 ENCOUNTER — Other Ambulatory Visit: Payer: Self-pay | Admitting: *Deleted

## 2012-06-12 ENCOUNTER — Ambulatory Visit: Payer: Self-pay | Admitting: Neurosurgery

## 2012-06-12 ENCOUNTER — Encounter (INDEPENDENT_AMBULATORY_CARE_PROVIDER_SITE_OTHER): Payer: 59 | Admitting: *Deleted

## 2012-06-12 VITALS — BP 150/98 | HR 95 | Ht 66.0 in | Wt 232.0 lb

## 2012-06-12 DIAGNOSIS — R0989 Other specified symptoms and signs involving the circulatory and respiratory systems: Secondary | ICD-10-CM

## 2012-06-12 DIAGNOSIS — I739 Peripheral vascular disease, unspecified: Secondary | ICD-10-CM

## 2012-06-12 DIAGNOSIS — Z48812 Encounter for surgical aftercare following surgery on the circulatory system: Secondary | ICD-10-CM

## 2012-06-12 NOTE — Progress Notes (Signed)
[facility]  Patient name: Michelle Patton MRN: [id number] DOB: August 09, 1955 Sex: female  REASON FOR VISIT: Left leg pain.  HPI: Michelle Patton is a 57 y.o. female who underwent previous left external iliac artery stenting in September of 2009. She was last seen in our office in May 2011 and had no symptoms with normal ABIs but laterally. She was lost to follow up after that. She states that for the last 2-1/2 years she has had left calf claudication is brought on by brisk walking. This occurs at one third of a mile. She also has some left hip pain but this occurs at rest and is likely related to arthritis. She has cramps in her legs at night but no rest pain. She denies any history of nonhealing ulcers.  She denies any previous history of stroke, TIAs, expressive or receptive aphasia, or amaurosis fugax.  Quit tobacco in 2011. Does have a family history of premature cardiovascular disease. Her other risk factors for peripheral vascular disease include hypertension and hypercholesterolemia.  Past Medical History  Diagnosis Date  . Hypertension   . Hyperlipidemia   . GERD (gastroesophageal reflux disease)   . Supravalvular aortic stenosis   . DVT (deep venous thrombosis)     Family History  Problem Relation Age of Onset  . Hypertension Mother   . Hypertension Father   . Heart disease Father     before age 39  . Other Father     varicose veins    SOCIAL HISTORY: History  Substance Use Topics  . Smoking status: Former Smoker    Quit date: 05/25/2009  . Smokeless tobacco: Never Used  . Alcohol Use: 2.4 oz/week    4 Glasses of wine per week     Comment: 4 drinks per week    Allergies  Allergen Reactions  . Penicillins Hives    Current Outpatient Prescriptions  Medication Sig Dispense Refill  . aspirin 81 MG tablet Take 81 mg by mouth daily.      . benazepril (LOTENSIN) 20 MG tablet Take 20 mg by mouth daily.      . Calcium Carbonate-Vitamin D  (CALCIUM + D PO) Take by mouth.      . estrogen-methylTESTOSTERone (ESTRATEST) 1.25-2.5 MG per tablet Take 1 tablet by mouth daily.      . Flaxseed, Linseed, (FLAXSEED OIL PO) Take by mouth.      . Multiple Vitamins-Minerals (MULTIVITAMIN WITH MINERALS) tablet Take 1 tablet by mouth daily.      . RABEprazole (ACIPHEX) 20 MG tablet Take 1 tablet by mouth daily.      [name] omeprazole (PRILOSEC) 20 MG capsule Take 20 mg by mouth 2 (two) times daily.      . pravastatin (PRAVACHOL) 40 MG tablet Take 40 mg by mouth daily.       No current facility-administered medications for this visit.    REVIEW OF SYSTEMS: Arly.Keller ] denotes positive finding; [  ] denotes negative finding  CARDIOVASCULAR:  [ ]  chest pain   [ ]  chest pressure   [ ]  palpitations   [ ]  orthopnea   [ ]  dyspnea on exertion   Arly.Keller ] claudication  Left calf [ ]  rest pain   Arly.Keller ] DVT   [ ]  phlebitis PULMONARY:   [ ]  productive cough   [ ]  asthma   [ ]  wheezing NEUROLOGIC:   [ ]  weakness  [ ]  paresthesias  [ ]  aphasia  [ ]  amaurosis  [ ]   dizziness HEMATOLOGIC:   [ ]  bleeding problems   [ ]  clotting disorders MUSCULOSKELETAL:  [ ]  joint pain   [ ]  joint swelling [ ]  leg swelling GASTROINTESTINAL: [ ]   blood in stool  [ ]   hematemesis GENITOURINARY:  [ ]   dysuria  [ ]   hematuria PSYCHIATRIC:  [ ]  history of major depression INTEGUMENTARY:  [ ]  rashes  [ ]  ulcers CONSTITUTIONAL:  [ ]  fever   [ ]  chills  PHYSICAL EXAM: Filed Vitals:   06/12/12 1045  BP: 150/98  Pulse: 95  Height: 5\' 6"  (1.676 m)  Weight: 232 lb (105.235 kg)  SpO2: 100%   Body mass index is 37.46 kg/(m^2). GENERAL: The patient is a well-nourished female, in no acute distress. The vital signs are documented above. CARDIOVASCULAR: There is a regular rate and rhythm. She has bilateral carotid bruits. She has palpable femoral pulses palpable popliteal pulses and palpable posterior tibial pulses bilaterally. She has a palpable left dorsalis pedis pulse. I cannot palpate a right  dorsalis pedis pulse. She has no significant lower extremity swelling. PULMONARY: There is good air exchange bilaterally without wheezing or rales. ABDOMEN: Soft and non-tender with normal pitched bowel sounds.  MUSCULOSKELETAL: There are no major deformities or cyanosis. NEUROLOGIC: No focal weakness or paresthesias are detected. SKIN: There are no ulcers or rashes noted. PSYCHIATRIC: The patient has a normal affect.  DATA:  I have independently interpreted her arterial Doppler study today which shows triphasic waveforms throughout her iliac arteries bilaterally. Shehas ABIs of 100% bilaterally. She does have elevated velocities in the proximal left external iliac artery with peak systolic velocity of 281 cm/s.  MEDICAL ISSUES:  Carotid artery bruit This patient has bilateral carotid artery bruits. She is asymptomatic. She comes in for her follow up visit after her arteriogram we will obtain a carotid duplex scan. I reviewed our records from our office and I do not see that she has had a previous carotid duplex scan. He is on aspirin.   Peripheral vascular disease, unspecified Although the patient has palpable pedal pulses at rest, I suspect that she has developed some stenosis in the proximal left external iliac artery which is explaining her symptoms in the left lower extremity. Her peak systolic velocity in the proximal left external iliac artery is 281 cm/s. I have recommended we proceed with arteriography.I have reviewed with the patient the indications for arteriography. In addition, I have reviewed the potential complications of arteriography including but not limited to: Bleeding, arterial injury, arterial thrombosis, dye action, renal insufficiency, or other unpredictable medical problems. I have explained to the patient that if we find disease amenable to angioplasty we could potentially address this at the same time. I have discussed the potential complications of angioplasty and  stenting, including but not limited to: Bleeding, arterial thrombosis, arterial injury, dissection, or the need for surgical intervention.     Michelle Patton S [facility] [place] Beeper: [phone]

## 2012-06-12 NOTE — Assessment & Plan Note (Signed)
Although the patient has palpable pedal pulses at rest, I suspect that she has developed some stenosis in the proximal left external iliac artery which is explaining her symptoms in the left lower extremity. Her peak systolic velocity in the proximal left external iliac artery is 281 cm/s. I have recommended we proceed with arteriography.I have reviewed with the patient the indications for arteriography. In addition, I have reviewed the potential complications of arteriography including but not limited to: Bleeding, arterial injury, arterial thrombosis, dye action, renal insufficiency, or other unpredictable medical problems. I have explained to the patient that if we find disease amenable to angioplasty we could potentially address this at the same time. I have discussed the potential complications of angioplasty and stenting, including but not limited to: Bleeding, arterial thrombosis, arterial injury, dissection, or the need for surgical intervention.

## 2012-06-12 NOTE — Assessment & Plan Note (Signed)
This patient has bilateral carotid artery bruits. She is asymptomatic. She comes in for her follow up visit after her arteriogram we will obtain a carotid duplex scan. I reviewed our records from our office and I do not see that she has had a previous carotid duplex scan. He is on aspirin.

## 2012-06-17 ENCOUNTER — Other Ambulatory Visit: Payer: Self-pay

## 2012-06-19 ENCOUNTER — Encounter (HOSPITAL_COMMUNITY): Payer: Self-pay | Admitting: Pharmacist

## 2012-06-24 ENCOUNTER — Telehealth: Payer: Self-pay | Admitting: Vascular Surgery

## 2012-06-24 ENCOUNTER — Other Ambulatory Visit: Payer: Self-pay | Admitting: *Deleted

## 2012-06-24 ENCOUNTER — Encounter (HOSPITAL_COMMUNITY)
Admission: RE | Disposition: A | Discharge status: Home / Self Care | Payer: Self-pay | Source: Ambulatory Visit | Attending: Vascular Surgery

## 2012-06-24 ENCOUNTER — Ambulatory Visit (HOSPITAL_COMMUNITY)
Admission: RE | Admit: 2012-06-24 | Discharge: 2012-06-24 | Disposition: A | Discharge status: Home / Self Care | Payer: 59 | Source: Ambulatory Visit | Attending: Vascular Surgery | Admitting: Vascular Surgery

## 2012-06-24 DIAGNOSIS — I774 Celiac artery compression syndrome: Secondary | ICD-10-CM | POA: Insufficient documentation

## 2012-06-24 DIAGNOSIS — M79609 Pain in unspecified limb: Secondary | ICD-10-CM

## 2012-06-24 DIAGNOSIS — I739 Peripheral vascular disease, unspecified: Secondary | ICD-10-CM

## 2012-06-24 DIAGNOSIS — I708 Atherosclerosis of other arteries: Secondary | ICD-10-CM | POA: Insufficient documentation

## 2012-06-24 HISTORY — PX: ABDOMINAL AORTAGRAM: SHX5706

## 2012-06-24 HISTORY — PX: ABDOMINAL AORTAGRAM: SHX5454

## 2012-06-24 LAB — POCT I-STAT, CHEM 8
BUN: 14 mg/dL (ref 6–23)
Calcium, Ion: 1.21 mmol/L (ref 1.12–1.23)
Chloride: 105 mEq/L (ref 96–112)
Creatinine, Ser: 1.1 mg/dL (ref 0.50–1.10)
Glucose, Bld: 92 mg/dL (ref 70–99)
HCT: 36 % (ref 36.0–46.0)
Hemoglobin: 12.2 g/dL (ref 12.0–15.0)
Potassium: 3.4 mEq/L — ABNORMAL LOW (ref 3.5–5.1)
Sodium: 142 mEq/L (ref 135–145)
TCO2: 27 mmol/L (ref 0–100)

## 2012-06-24 SURGERY — ABDOMINAL AORTAGRAM
Anesthesia: LOCAL

## 2012-06-24 MED ORDER — LIDOCAINE HCL (PF) 1 % IJ SOLN
INTRAMUSCULAR | Status: AC
  Filled 2012-06-24: qty 30

## 2012-06-24 MED ORDER — FENTANYL CITRATE 0.05 MG/ML IJ SOLN
INTRAMUSCULAR | Status: AC
  Filled 2012-06-24: qty 2

## 2012-06-24 MED ORDER — SODIUM CHLORIDE 0.9 % IV SOLN: 1.0000 mL/kg/h | INTRAVENOUS | Status: DC

## 2012-06-24 MED ORDER — SODIUM CHLORIDE 0.9 % IV SOLN
INTRAVENOUS | Status: DC
  Administered 2012-06-24: 06:00:00 via INTRAVENOUS

## 2012-06-24 MED ORDER — ACETAMINOPHEN 325 MG PO TABS: 650.0000 mg | ORAL_TABLET | ORAL | Status: DC | PRN

## 2012-06-24 MED ORDER — MIDAZOLAM HCL 2 MG/2ML IJ SOLN
INTRAMUSCULAR | Status: AC
  Filled 2012-06-24: qty 2

## 2012-06-24 MED ORDER — ONDANSETRON HCL 4 MG/2ML IJ SOLN: 4.0000 mg | Freq: Four times a day (QID) | INTRAMUSCULAR | Status: DC | PRN

## 2012-06-24 MED ORDER — HEPARIN (PORCINE) IN NACL 2-0.9 UNIT/ML-% IJ SOLN
INTRAMUSCULAR | Status: AC
  Filled 2012-06-24: qty 1000

## 2012-06-24 NOTE — Telephone Encounter (Addendum)
Message copied by Shari Prows on Mon Jun 24, 2012  3:12 PM ------      Message from: Melene Plan      Created: Mon Jun 24, 2012 10:19 AM      Regarding: FW: charge and f/u                   ----- Message -----         From: Chuck Hint, MD         Sent: 06/24/2012   8:18 AM           To: Reuel Derby, Melene Plan, RN      Subject: charge and f/u                                           PROCEDURE:       1. Ultrasound-guided access to the left common femoral artery      2. Aortogram with bilateral iliac arteriogram and bilateral lower extremity runoff      3. Perclose left common femoral artery.            SURGEON: Di Kindle. Edilia Bo, MD, FACS            She has a follow up appointment on 07/10/2012 which time she will get a carotid duplex scan. AFTER THAT SHE WILL NEED A 6 MONTH FOLLOW UP WITH ABIs and  a duplex of her left external iliac artery stent. Thank you.CD ------  I scheduled a follow up appt w/ the pt for 12/25/12 at 3pm. I also mailed her an appt letter.awt

## 2012-06-24 NOTE — Progress Notes (Signed)
PER DR DICKSON OK TO D/C AFTER 2HRS BEDREST

## 2012-06-24 NOTE — Op Note (Signed)
PATIENT: Michelle Patton   MRN: [id number] DOB: 1956/04/24    DATE OF PROCEDURE: 06/24/2012  INDICATIONS: [name] is a 57 y.o. female left leg pain status post left external iliac artery stent  PROCEDURE:  1. Ultrasound-guided access to the left common femoral artery 2. Aortogram with bilateral iliac arteriogram and bilateral lower extremity runoff 3. Perclose left common femoral artery.  SURGEON: Di Kindle. Edilia Bo, MD, FACS  ANESTHESIA: local with sedation   EBL: minimal  TECHNIQUE: The patient was brought to the peripheral vascular lab and received 2 mg of Versed and 50 mcg of fentanyl. Both groins were prepped and draped in usual sterile fashion. After the skin was infiltrated with 1% lidocaine, and under ultrasound guidance, the left common femoral artery was cannulated and a guidewire introduced into the infrarenal aorta under fluoroscopic control. A 5 French sheath was introduced over the wire. The pigtail catheter was positioned at the L1 vertebral body and flush aortogram obtained. Catheter was then advanced in a lateral projection was obtained. The catheter was then brought down above the aortic bifurcation and oblique iliac projections were obtained. Next bilateral lower extremity runoff films were obtained. At the completion the 5 French sheath was exchanged for a Perclose device. The artery was closed without complication noted. There appeared to be good hemostasis. Patient was transferred to short stay in stable condition.  FINDINGS:  1. There are single renal arteries bilaterally with no renal artery stenosis noted. 2. There is a 20% stenosis of the proximal celiac axis, a 90% stenosis of the proximal superior mesenteric artery, the IMA is widely patent and provides collaterals. 3. The infrarenal aorta, bilateral common iliac arteries, bilateral external iliac arteries, and bilateral hypogastric arteries are widely patent. The previously stented left external iliac artery is  widely patent with minimal stenosis of less than 20% noted. 4. There is no significant infrainguinal arterial occlusive disease bilaterally. The common femoral, deep femoral, superficial femoral, popliteal, anterior tibial, posterior tibial, and peroneal arteries are patent bilaterally.  Waverly Ferrari, MD, FACS [facility]  DATE OF DICTATION:   06/24/2012

## 2012-06-24 NOTE — H&P (View-Only) (Signed)
[facility] [place]  Patient name: Michelle Patton MRN: [id number] DOB: 10/02/1955 Sex: female  REASON FOR VISIT: Left leg pain.  HPI: Michelle Patton is a 56 y.o. female who underwent previous left external iliac artery stenting in September of 2009. She was last seen in our office in May 2011 and had no symptoms with normal ABIs but laterally. She was lost to follow up after that. She states that for the last 2-1/2 years she has had left calf claudication is brought on by brisk walking. This occurs at one third of a mile. She also has some left hip pain but this occurs at rest and is likely related to arthritis. She has cramps in her legs at night but no rest pain. She denies any history of nonhealing ulcers.  She denies any previous history of stroke, TIAs, expressive or receptive aphasia, or amaurosis fugax.  Quit tobacco in 2011. Does have a family history of premature cardiovascular disease. Her other risk factors for peripheral vascular disease include hypertension and hypercholesterolemia.  Past Medical History  Diagnosis Date  . Hypertension   . Hyperlipidemia   . GERD (gastroesophageal reflux disease)   . Supravalvular aortic stenosis   . DVT (deep venous thrombosis)     Family History  Problem Relation Age of Onset  . Hypertension Mother   . Hypertension Father   . Heart disease Father     before age 60  . Other Father     varicose veins    SOCIAL HISTORY: History  Substance Use Topics  . Smoking status: Former Smoker    Quit date: 05/25/2009  . Smokeless tobacco: Never Used  . Alcohol Use: 2.4 oz/week    4 Glasses of wine per week     Comment: 4 drinks per week    Allergies  Allergen Reactions  . Penicillins Hives    Current Outpatient Prescriptions  Medication Sig Dispense Refill  . aspirin 81 MG tablet Take 81 mg by mouth daily.      . benazepril (LOTENSIN) 20 MG tablet Take 20 mg by mouth daily.      . Calcium Carbonate-Vitamin D  (CALCIUM + D PO) Take by mouth.      . estrogen-methylTESTOSTERone (ESTRATEST) 1.25-2.5 MG per tablet Take 1 tablet by mouth daily.      . Flaxseed, Linseed, (FLAXSEED OIL PO) Take by mouth.      . Multiple Vitamins-Minerals (MULTIVITAMIN WITH MINERALS) tablet Take 1 tablet by mouth daily.      . RABEprazole (ACIPHEX) 20 MG tablet Take 1 tablet by mouth daily.      . omeprazole (PRILOSEC) 20 MG capsule Take 20 mg by mouth 2 (two) times daily.      . pravastatin (PRAVACHOL) 40 MG tablet Take 40 mg by mouth daily.       No current facility-administered medications for this visit.    REVIEW OF SYSTEMS: [X ] denotes positive finding; [  ] denotes negative finding  CARDIOVASCULAR:  [ ] chest pain   [ ] chest pressure   [ ] palpitations   [ ] orthopnea   [ ] dyspnea on exertion   [X ] claudication  Left calf [ ] rest pain   [X ] DVT   [ ] phlebitis PULMONARY:   [ ] productive cough   [ ] asthma   [ ] wheezing NEUROLOGIC:   [ ] weakness  [ ] paresthesias  [ ] aphasia  [ ] amaurosis  [ ]   dizziness HEMATOLOGIC:   [ ] bleeding problems   [ ] clotting disorders MUSCULOSKELETAL:  [ ] joint pain   [ ] joint swelling [ ] leg swelling GASTROINTESTINAL: [ ]  blood in stool  [ ]  hematemesis GENITOURINARY:  [ ]  dysuria  [ ]  hematuria PSYCHIATRIC:  [ ] history of major depression INTEGUMENTARY:  [ ] rashes  [ ] ulcers CONSTITUTIONAL:  [ ] fever   [ ] chills  PHYSICAL EXAM: Filed Vitals:   06/12/12 1045  BP: 150/98  Pulse: 95  Height: 5' 6" (1.676 m)  Weight: 232 lb (105.235 kg)  SpO2: 100%   Body mass index is 37.46 kg/(m^2). GENERAL: The patient is a well-nourished female, in no acute distress. The vital signs are documented above. CARDIOVASCULAR: There is a regular rate and rhythm. She has bilateral carotid bruits. She has palpable femoral pulses palpable popliteal pulses and palpable posterior tibial pulses bilaterally. She has a palpable left dorsalis pedis pulse. I cannot palpate a right  dorsalis pedis pulse. She has no significant lower extremity swelling. PULMONARY: There is good air exchange bilaterally without wheezing or rales. ABDOMEN: Soft and non-tender with normal pitched bowel sounds.  MUSCULOSKELETAL: There are no major deformities or cyanosis. NEUROLOGIC: No focal weakness or paresthesias are detected. SKIN: There are no ulcers or rashes noted. PSYCHIATRIC: The patient has a normal affect.  DATA:  I have independently interpreted her arterial Doppler study today which shows triphasic waveforms throughout her iliac arteries bilaterally. Shehas ABIs of 100% bilaterally. She does have elevated velocities in the proximal left external iliac artery with peak systolic velocity of 281 cm/s.  MEDICAL ISSUES:  Carotid artery bruit This patient has bilateral carotid artery bruits. She is asymptomatic. She comes in for her follow up visit after her arteriogram we will obtain a carotid duplex scan. I reviewed our records from our office and I do not see that she has had a previous carotid duplex scan. He is on aspirin.   Peripheral vascular disease, unspecified Although the patient has palpable pedal pulses at rest, I suspect that she has developed some stenosis in the proximal left external iliac artery which is explaining her symptoms in the left lower extremity. Her peak systolic velocity in the proximal left external iliac artery is 281 cm/s. I have recommended we proceed with arteriography.I have reviewed with the patient the indications for arteriography. In addition, I have reviewed the potential complications of arteriography including but not limited to: Bleeding, arterial injury, arterial thrombosis, dye action, renal insufficiency, or other unpredictable medical problems. I have explained to the patient that if we find disease amenable to angioplasty we could potentially address this at the same time. I have discussed the potential complications of angioplasty and  stenting, including but not limited to: Bleeding, arterial thrombosis, arterial injury, dissection, or the need for surgical intervention.     Tudor Chandley S [facility] [place] [facility]per: [phone]    

## 2012-06-24 NOTE — Progress Notes (Signed)
UP AND WALKED AND TOL WELL AND LEFT GROIN STABLE; NO BLEEDING OR HEMATOMA 

## 2012-06-24 NOTE — Interval H&P Note (Signed)
History and Physical Interval Note:  06/24/2012 7:26 AM  Michelle Patton  has presented today for surgery, with the diagnosis of PVD  The various methods of treatment have been discussed with the patient and family. After consideration of risks, benefits and other options for treatment, the patient has consented to  Procedure(s): ABDOMINAL AORTAGRAM (N/A) as a surgical intervention .  The patient's history has been reviewed, patient examined, no change in status, stable for surgery.  I have reviewed the patient's chart and labs.  Questions were answered to the patient's satisfaction.     Michelle Patton S

## 2012-06-25 ENCOUNTER — Telehealth: Payer: Self-pay

## 2012-06-25 NOTE — Telephone Encounter (Signed)
Per recommendation of Dr. Edilia Bo, pt. Needs to stay off work x 2 day post Aortogram of 06/24/12.  Pt. Made aware per phone

## 2012-06-25 NOTE — Telephone Encounter (Signed)
Message copied by Phillips Odor on Tue Jun 25, 2012 10:08 AM ------      Message from: Chuck Hint      Created: Tue Jun 25, 2012  9:29 AM      Regarding: RE: clarification       2 days       Thanks      Thayer Ohm      ----- Message -----         From: Erenest Blank, RN         Sent: 06/24/2012   5:11 PM           To: Chuck Hint, MD      Subject: clarification                                            S/p Aortogram, bilat iliac arteriogram w/ runoff today.  States that the discharge paperwork said return to work in 2 days, and your instructions said return to work after 1 day.  She is asking for clarification.        ------

## 2012-07-09 ENCOUNTER — Encounter: Payer: Self-pay | Admitting: Vascular Surgery

## 2012-07-10 ENCOUNTER — Ambulatory Visit (INDEPENDENT_AMBULATORY_CARE_PROVIDER_SITE_OTHER): Payer: 59 | Admitting: Vascular Surgery

## 2012-07-10 ENCOUNTER — Encounter: Payer: Self-pay | Admitting: Vascular Surgery

## 2012-07-10 ENCOUNTER — Other Ambulatory Visit (INDEPENDENT_AMBULATORY_CARE_PROVIDER_SITE_OTHER): Payer: 59 | Admitting: *Deleted

## 2012-07-10 DIAGNOSIS — I6529 Occlusion and stenosis of unspecified carotid artery: Secondary | ICD-10-CM

## 2012-07-10 NOTE — Progress Notes (Signed)
[facility] [place]  Patient name: Michelle Patton MRN: [id number] DOB: 05/13/1955 Sex: female  REASON FOR VISIT: follow up after arteriogram in follow up of carotid bruit.  HPI: Michelle Patton is a 57 y.o. female who had undergone previous left external iliac artery stent placement and had developed some elevated velocities near the stent. She underwent an arteriogram which showed no evidence of significant narrowing within the stent which was widely patent. Likewise she had no significant infrainguinal arterial occlusive disease. In addition she had carotid bruits detected on her last visit and therefore was set up for a carotid duplex scan. She is asymptomatic from that standpoint with no history of stroke, TIAs, expressive or receptive aphasia, or amaurosis fugax.   REVIEW OF SYSTEMS: Arly.Keller ] denotes positive finding; [  ] denotes negative finding  CARDIOVASCULAR:  [ ]  chest pain   [ ]  dyspnea on exertion    CONSTITUTIONAL:  [ ]  fever   [ ]  chills  PHYSICAL EXAM: Filed Vitals:   07/10/12 1623 07/10/12 1626  BP: 154/78 161/85  Pulse: 92 93  Resp: 16   Height: 5\' 6"  (1.676 m)   Weight: 233 lb (105.688 kg)   SpO2: 99% 100%   Body mass index is 37.63 kg/(m^2).  Carotid duplex scan shows less than 40% right carotid stenosis with no significant stenosis on the left. Both vertebral arteries are patent with antegrade flow.  MEDICAL ISSUES: This patient had no evidence of significant carotid disease and I do not think routine follow up duplex scans are indicated. We'll see her back for next routine scan of her stent in 6 months. She knows to call sooner if she has problems.  Chrisanna Mishra S [facility] [place] [facility]per: [phone]

## 2012-09-25 ENCOUNTER — Encounter: Payer: Self-pay | Admitting: Neurology

## 2012-09-26 ENCOUNTER — Encounter: Payer: Self-pay | Admitting: *Deleted

## 2012-09-26 ENCOUNTER — Other Ambulatory Visit: Payer: Self-pay | Admitting: Neurology

## 2012-09-26 ENCOUNTER — Ambulatory Visit (INDEPENDENT_AMBULATORY_CARE_PROVIDER_SITE_OTHER): Payer: 59 | Admitting: Neurology

## 2012-09-26 ENCOUNTER — Encounter: Payer: Self-pay | Admitting: Neurology

## 2012-09-26 VITALS — BP 103/58 | HR 67 | Ht 66.5 in | Wt 210.0 lb

## 2012-09-26 DIAGNOSIS — R252 Cramp and spasm: Secondary | ICD-10-CM | POA: Insufficient documentation

## 2012-09-26 MED ORDER — CLONAZEPAM 0.5 MG PO TABS: 0.5000 mg | ORAL_TABLET | Freq: Every day | ORAL | Status: DC

## 2012-09-26 NOTE — Progress Notes (Signed)
History of present illness:  [name] is a 57 years old right-handed Caucasian female, accompanied by her husband, referred by her primary care physician Dr. Charm Barges for evaluation of bilateral lower extremity muscle achy pain  She had a past medical history of hyperlipidemia, has been treated with different statin over the past 10 years, this including Lipitor, Crestor, Zocor, but over the past 10 years, she never had a period of time without any statin treatment, she also had a history of open heart surgery in 1968, she had a super valvular   aortic stenosis, she had aortic valve repair, is not taking any chronic anticoagulation treatment, she also had a history of left femoral artery stenosis, she presented with vascular claudication of her left lower extremity, she underwent angioplasty, and stent placement in 2010 by Dr. Durwin Nora, which has helped her left lower extremity symptoms,  She complains more than 10 years history of bilateral lower extremity muscle achy pain, mainly involving bilateral feet, anterior shin muscle, there was no clear trigger event, most bothersome time is at night time, gradually worsened over the past 10 years, with more frequency, more prolonged episode, currently she was awaken 4-5 times each night because of muscle cramping, it was so painful, she has to get out of the bed, walking to the kitchen, drinking some water, juice is helpful sometimes, episode can last 10-45 minutes, she has difficulty sleeping because of that  She is taking ambien, also Flexeril every night prn for symptomatic control  She complains of subjective bilateral lower extremity weakness, her legs felt heavy, but denies difficulty walking, she has chronic low back pain.  She denies family history of muscle disease, no bilateral upper extremity involvement, no sensory loss.   Review of Systems  Out of a complete 14 system review, the patient complains of only the following symptoms, and all other  reviewed systems are negative.   Constitutional:   N/A Cardiovascular:  N/A Ear/Nose/Throat:  N/A Skin: N/A Eyes: N/A Respiratory: N/A Gastroitestinal: N/A    Hematology/Lymphatic:  N/A Endocrine:  N/A Musculoskeletal: cramps Allergy/Immunology: N/A Neurological: N/A Psychiatric:    N/A  PHYSICAL EXAMINATOINS:  Generalized: In no acute distress  Neck: Supple, no carotid bruits   Cardiac: Regular rate rhythm  Pulmonary: Clear to auscultation bilaterally  Musculoskeletal: No deformity  Neurological examination  Mentation: Alert oriented to time, place, history taking, and causual conversation  Cranial nerve II-XII: Pupils were equal round reactive to light extraocular movements were full, visual field were full on confrontational test. facial sensation and strength were normal. hearing was intact to finger rubbing bilaterally. Uvula tongue midline.  head turning and shoulder shrug and were normal and symmetric.Tongue protrusion into cheek strength was normal.  Motor: normal tone, bulk and strength.  Sensory: Intact to fine touch, pinprick, preserved vibratory sensation, and proprioception at toes.  Coordination: Normal finger to nose, heel-to-shin bilaterally there was no truncal ataxia  Gait: Rising up from seated position without assistance, normal stance, without trunk ataxia, moderate stride, good arm swing, smooth turning, able to perform tiptoe, and heel walking without difficulty.   Romberg signs: Negative  Deep tendon reflexes: Brachioradialis 2/2, biceps 2/2, triceps 2/2, patellar 2/2, Achilles 2/2, plantar responses were flexor bilaterally.  Assessment and plan:   57 years old Caucasian female, with past medical history of peripheral artery disease, aortic stenosis, status post aortic valve repair, hyperlipidemia, on chronic statin treatment, she presented with frequent bilateral lower extremity muscle cramping, normal neurological examination, including muscle  strength.  1. Differentiation diagnosis of her complains of muscle cramping including electrolyte imbalance, thyroid dysfunction, statin-induced muscle cramping, intrinsic muscle disease, bilateral lumbar radiculopathy 2. Laboratory evaluation 3 EMG nerve conduction study 4 clonazepam 0.5 mg once every night

## 2012-09-27 LAB — COMPREHENSIVE METABOLIC PANEL
ALT: 15 IU/L (ref 0–32)
Albumin/Globulin Ratio: 1.8 (ref 1.1–2.5)
Albumin: 4.4 g/dL (ref 3.5–5.5)
BUN: 14 mg/dL (ref 6–24)
Calcium: 9.7 mg/dL (ref 8.7–10.2)
GFR calc Af Amer: 66 mL/min/{1.73_m2} (ref >59)
GFR calc non Af Amer: 57 mL/min/{1.73_m2} — ABNORMAL LOW (ref >59)
Glucose: 94 mg/dL (ref 65–99)
Total Bilirubin: 0.2 mg/dL (ref 0.0–1.2)
Total Protein: 6.9 g/dL (ref 6.0–8.5)

## 2012-09-27 LAB — LIPID PANEL
Cholesterol, Total: 181 mg/dL (ref 100–199)
HDL: 64 mg/dL (ref >39)
LDL Calculated: 88 mg/dL (ref 0–99)
VLDL Cholesterol Cal: 29 mg/dL (ref 5–40)

## 2012-09-27 LAB — TSH: TSH: 4.36 u[IU]/mL (ref 0.450–4.500)

## 2012-09-27 LAB — CK: Total CK: 305 U/L — ABNORMAL HIGH (ref 24–173)

## 2012-09-27 LAB — FOLATE: Folate: >19.9 ng/mL (ref >3.0)

## 2012-09-27 LAB — VITAMIN B12: Vitamin B-12: 869 pg/mL (ref 211–946)

## 2012-09-27 NOTE — Progress Notes (Signed)
Quick Note:  Noted mild elevated CPK will discuss on EMg/NCS. ______

## 2012-10-08 ENCOUNTER — Encounter: Payer: 59 | Admitting: Neurology

## 2012-10-10 ENCOUNTER — Ambulatory Visit (INDEPENDENT_AMBULATORY_CARE_PROVIDER_SITE_OTHER): Payer: 59 | Admitting: Neurology

## 2012-10-10 ENCOUNTER — Encounter (INDEPENDENT_AMBULATORY_CARE_PROVIDER_SITE_OTHER): Payer: 59

## 2012-10-10 DIAGNOSIS — Z0289 Encounter for other administrative examinations: Secondary | ICD-10-CM

## 2012-10-10 DIAGNOSIS — R252 Cramp and spasm: Secondary | ICD-10-CM

## 2012-10-10 DIAGNOSIS — I739 Peripheral vascular disease, unspecified: Secondary | ICD-10-CM

## 2012-10-10 NOTE — Procedures (Signed)
History of present illness: 57 years old Caucasian female, with a long-standing statin treatment, presenting with frequent muscle cramping, especially nighttime.  Nerve conduction studies: Bilateral peroneal sensory responses were normal. Bilateral tibial, and peroneal motor responses were normal. Bilateral tibial H. reflexes were normal and symmetric.  Electromyography: Selected needle examination was performed at right lower extremity muscles, and right lumbosacral paraspinal muscles.  Needle examination of right tibialis anterior, tibialis posterior, medial gastrocnemius, peroneal longus, vastus lateralis, biceps femoris short head was normal.  There was no spontaneous activity at right lumbosacral paraspinal muscles, right L4, 5, S1.   IN CONCLUSION: This is a normal study. There is no electrodiagnostic evidence of large fiber peripheral neuropathy, right lower extremity neuropathy, or right lumbosacral radiculopathy.

## 2012-12-11 ENCOUNTER — Ambulatory Visit: Payer: 59 | Admitting: Neurology

## 2012-12-25 ENCOUNTER — Ambulatory Visit: Payer: 59 | Admitting: Vascular Surgery

## 2013-01-23 ENCOUNTER — Other Ambulatory Visit: Payer: Self-pay | Admitting: *Deleted

## 2013-01-23 DIAGNOSIS — I739 Peripheral vascular disease, unspecified: Secondary | ICD-10-CM

## 2013-01-23 DIAGNOSIS — Z48812 Encounter for surgical aftercare following surgery on the circulatory system: Secondary | ICD-10-CM

## 2013-02-12 ENCOUNTER — Ambulatory Visit: Payer: 59 | Admitting: Vascular Surgery

## 2013-02-12 ENCOUNTER — Inpatient Hospital Stay (HOSPITAL_COMMUNITY)
Admission: RE | Admit: 2013-02-12 | Discharge: 2013-02-12 | Disposition: A | Discharge status: Home / Self Care | Payer: 59 | Source: Ambulatory Visit | Attending: Vascular Surgery | Admitting: Vascular Surgery

## 2013-02-12 ENCOUNTER — Encounter (HOSPITAL_COMMUNITY): Payer: 59

## 2013-02-12 DIAGNOSIS — I739 Peripheral vascular disease, unspecified: Secondary | ICD-10-CM

## 2013-02-12 DIAGNOSIS — Z48812 Encounter for surgical aftercare following surgery on the circulatory system: Secondary | ICD-10-CM

## 2013-09-17 ENCOUNTER — Emergency Department (HOSPITAL_COMMUNITY)
Admission: EM | Admit: 2013-09-17 | Discharge: 2013-09-17 | Disposition: A | Discharge status: Home / Self Care | Payer: BC Managed Care – PPO | Attending: Emergency Medicine | Admitting: Emergency Medicine

## 2013-09-17 ENCOUNTER — Encounter (HOSPITAL_COMMUNITY): Payer: Self-pay | Admitting: Emergency Medicine

## 2013-09-17 DIAGNOSIS — I10 Essential (primary) hypertension: Secondary | ICD-10-CM | POA: Insufficient documentation

## 2013-09-17 DIAGNOSIS — Z86718 Personal history of other venous thrombosis and embolism: Secondary | ICD-10-CM | POA: Insufficient documentation

## 2013-09-17 DIAGNOSIS — R197 Diarrhea, unspecified: Secondary | ICD-10-CM | POA: Insufficient documentation

## 2013-09-17 DIAGNOSIS — I2510 Atherosclerotic heart disease of native coronary artery without angina pectoris: Secondary | ICD-10-CM | POA: Insufficient documentation

## 2013-09-17 DIAGNOSIS — I739 Peripheral vascular disease, unspecified: Secondary | ICD-10-CM | POA: Insufficient documentation

## 2013-09-17 DIAGNOSIS — I359 Nonrheumatic aortic valve disorder, unspecified: Secondary | ICD-10-CM | POA: Insufficient documentation

## 2013-09-17 DIAGNOSIS — E785 Hyperlipidemia, unspecified: Secondary | ICD-10-CM | POA: Insufficient documentation

## 2013-09-17 DIAGNOSIS — Z87891 Personal history of nicotine dependence: Secondary | ICD-10-CM | POA: Insufficient documentation

## 2013-09-17 DIAGNOSIS — K219 Gastro-esophageal reflux disease without esophagitis: Secondary | ICD-10-CM | POA: Insufficient documentation

## 2013-09-17 DIAGNOSIS — R112 Nausea with vomiting, unspecified: Secondary | ICD-10-CM | POA: Insufficient documentation

## 2013-09-17 DIAGNOSIS — G4700 Insomnia, unspecified: Secondary | ICD-10-CM | POA: Insufficient documentation

## 2013-09-17 DIAGNOSIS — Z79899 Other long term (current) drug therapy: Secondary | ICD-10-CM | POA: Insufficient documentation

## 2013-09-17 DIAGNOSIS — Z954 Presence of other heart-valve replacement: Secondary | ICD-10-CM | POA: Insufficient documentation

## 2013-09-17 DIAGNOSIS — Z7982 Long term (current) use of aspirin: Secondary | ICD-10-CM | POA: Insufficient documentation

## 2013-09-17 DIAGNOSIS — Z880 Allergy status to penicillin: Secondary | ICD-10-CM | POA: Insufficient documentation

## 2013-09-17 LAB — CBC WITH DIFFERENTIAL/PLATELET
Basophils Absolute: 0 10*3/uL (ref 0.0–0.1)
Basophils Relative: 0 % (ref 0–1)
Eosinophils Absolute: 0 10*3/uL (ref 0.0–0.7)
Eosinophils Relative: 0 % (ref 0–5)
HCT: 39.3 % (ref 36.0–46.0)
HEMOGLOBIN: 13 g/dL (ref 12.0–15.0)
LYMPHS ABS: 0.2 10*3/uL — AB (ref 0.7–4.0)
Lymphocytes Relative: 2 % — ABNORMAL LOW (ref 12–46)
MCH: 28.3 pg (ref 26.0–34.0)
MCHC: 33.1 g/dL (ref 30.0–36.0)
MCV: 85.4 fL (ref 78.0–100.0)
MONOS PCT: 4 % (ref 3–12)
Monocytes Absolute: 0.4 10*3/uL (ref 0.1–1.0)
NEUTROS ABS: 9.4 10*3/uL — AB (ref 1.7–7.7)
NEUTROS PCT: 94 % — AB (ref 43–77)
Platelets: 256 10*3/uL (ref 150–400)
RBC: 4.6 MIL/uL (ref 3.87–5.11)
RDW: 13.4 % (ref 11.5–15.5)
WBC: 9.9 10*3/uL (ref 4.0–10.5)

## 2013-09-17 LAB — COMPREHENSIVE METABOLIC PANEL
ALT: 17 U/L (ref 0–35)
AST: 21 U/L (ref 0–37)
Albumin: 3.6 g/dL (ref 3.5–5.2)
Alkaline Phosphatase: 48 U/L (ref 39–117)
BUN: 24 mg/dL — ABNORMAL HIGH (ref 6–23)
CALCIUM: 8.5 mg/dL (ref 8.4–10.5)
CO2: 25 meq/L (ref 19–32)
CREATININE: 0.96 mg/dL (ref 0.50–1.10)
Chloride: 100 mEq/L (ref 96–112)
GFR, EST AFRICAN AMERICAN: 75 mL/min — AB (ref >90)
GFR, EST NON AFRICAN AMERICAN: 65 mL/min — AB (ref >90)
GLUCOSE: 155 mg/dL — AB (ref 70–99)
Potassium: 3.6 mEq/L — ABNORMAL LOW (ref 3.7–5.3)
Sodium: 139 mEq/L (ref 137–147)
Total Bilirubin: 0.6 mg/dL (ref 0.3–1.2)
Total Protein: 7 g/dL (ref 6.0–8.3)

## 2013-09-17 MED ORDER — SODIUM CHLORIDE 0.9 % IV BOLUS (SEPSIS)
1000.0000 mL | Freq: Once | INTRAVENOUS | Status: AC
  Administered 2013-09-17: 1000 mL via INTRAVENOUS

## 2013-09-17 MED ORDER — ONDANSETRON HCL 4 MG/2ML IJ SOLN
INTRAMUSCULAR | Status: AC
  Administered 2013-09-17: 4 mg
  Filled 2013-09-17: qty 2

## 2013-09-17 MED ORDER — ONDANSETRON 4 MG PO TBDP: 4.0000 mg | ORAL_TABLET | Freq: Three times a day (TID) | ORAL | Status: DC | PRN

## 2013-09-17 MED ORDER — PROMETHAZINE HCL 25 MG/ML IJ SOLN
25.0000 mg | Freq: Once | INTRAMUSCULAR | Status: AC
  Administered 2013-09-17: 25 mg via INTRAVENOUS
  Filled 2013-09-17: qty 1

## 2013-09-17 MED ORDER — PROMETHAZINE HCL 25 MG PO TABS: 25.0000 mg | ORAL_TABLET | Freq: Four times a day (QID) | ORAL | Status: DC | PRN

## 2013-09-17 MED ORDER — FENTANYL CITRATE 0.05 MG/ML IJ SOLN
50.0000 ug | Freq: Once | INTRAMUSCULAR | Status: AC
  Administered 2013-09-17: 50 ug via INTRAVENOUS
  Filled 2013-09-17: qty 2

## 2013-09-17 MED ORDER — ONDANSETRON HCL 4 MG/2ML IJ SOLN
INTRAMUSCULAR | Status: AC
  Filled 2013-09-17: qty 2

## 2013-09-17 MED ORDER — ONDANSETRON HCL 4 MG/2ML IJ SOLN
4.0000 mg | Freq: Once | INTRAMUSCULAR | Status: AC
  Administered 2013-09-17: 4 mg via INTRAVENOUS

## 2013-09-17 MED ORDER — PROMETHAZINE HCL 25 MG RE SUPP: 25.0000 mg | Freq: Four times a day (QID) | RECTAL | Status: DC | PRN

## 2013-09-17 NOTE — ED Notes (Signed)
Patient c/o abdominal pain, nausea, vomiting, diarrhea, hot & cold chills, and headache since yesterday.

## 2013-09-17 NOTE — Discharge Instructions (Signed)
Your tests are all normal - I suspect that you have gastroenteritis - this is usually a virus infection of your colon and stomach that give you nausea, vomiting and diarrhea - the treatment is hydration and time - please use the medicine called zofran for nausea - if this doesn't work, try phenergan (either oral or rectal suppository).  Drink clear fluids such as gatorade, juices or water.  The symptoms typically last 1-2 days in total and it is contagious to others so good hand hygiene is vital.  Please call your doctor for a followup appointment within 24-48 hours. When you talk to your doctor please let them know that you were seen in the emergency department and have them acquire all of your records so that they can discuss the findings with you and formulate a treatment plan to fully care for your new and ongoing problems.

## 2013-09-17 NOTE — ED Provider Notes (Signed)
CSN: [id number]     Arrival date & time 09/17/13  0029 History   First MD Initiated Contact with Patient 09/17/13 0308     Chief Complaint  Patient presents with  . Abdominal Pain     (Consider location/radiation/quality/duration/timing/severity/associated sxs/prior Treatment) HPI Comments: 58 year old female with a history of hypertension and hyperlipidemia who has also had aortic stenosis with a valve repair back in 1960s. She presents with a complaint of nausea vomiting and diarrhea. She states that she returned home from work at 4:00 PM at which time she started to feel nauseated and began to vomit. Her vomiting was nonbloody, nonbilious, multiple episodes. She has also had multiple episodes of watery diarrhea which does seem to have slowed down. This was also nonbloody. She does report having subjective chills as well. Symptoms are persistent, nothing makes this better or worse until she arrived and received IV fluids and Zofran which did seem to help.  Patient is a 58 y.o. female presenting with abdominal pain. The history is provided by the patient.  Abdominal Pain   Past Medical History  Diagnosis Date  . Hypertension   . Hyperlipidemia   . GERD (gastroesophageal reflux disease)   . Supravalvular aortic stenosis   . DVT (deep venous thrombosis)   . Insomnia, unspecified   . Muscle weakness (generalized)   . CAD (coronary artery disease)   . Peripheral artery disease    Past Surgical History  Procedure Laterality Date  . Iliac artery stent Left 12/2007  . Cardiac valve surgery  1968  . Cholecystectomy    . Cervical fusion    . Rotator cuff repair Bilateral   . Abdominal aortagram  06/24/12   Family History  Problem Relation Age of Onset  . Hypertension Mother   . Hypertension Father   . Heart disease Father     before age 62  . Other Father     varicose veins   History  Substance Use Topics  . Smoking status: Former Smoker    Quit date: 05/25/2009  . Smokeless  tobacco: Never Used  . Alcohol Use: 2.4 oz/week    4 Glasses of wine per week     Comment: 4 drinks per week   OB History   Grav Para Term Preterm Abortions TAB SAB Ect Mult Living                 Review of Systems  Gastrointestinal: Positive for abdominal pain.  All other systems reviewed and are negative.     Allergies  Penicillins and Sulfa antibiotics  Home Medications   Prior to Admission medications   Medication Sig Start Date End Date Taking? Authorizing Provider  aspirin 81 MG tablet Take 81 mg by mouth at bedtime.     Historical Provider, MD  benazepril (LOTENSIN) 20 MG tablet Take 40 mg by mouth at bedtime.     Historical Provider, MD  calcium-vitamin D (OSCAL WITH D) 500-200 MG-UNIT per tablet Take 1 tablet by mouth at bedtime.    Historical Provider, MD  clonazePAM (KLONOPIN) 0.5 MG tablet Take 1 tablet (0.5 mg total) by mouth at bedtime. 09/26/12   Marcial Pacas, MD  cyclobenzaprine (FLEXERIL) 10 MG tablet Take 10 mg by mouth daily.    Historical Provider, MD  Flaxseed, Linseed, (FLAXSEED OIL PO) Take 1 tablet by mouth at bedtime.     Historical Provider, MD  Multiple Vitamins-Minerals (MULTIVITAMIN WITH MINERALS) tablet Take 1 tablet by mouth at bedtime.  Historical Provider, MD  RABEprazole (ACIPHEX) 20 MG tablet Take 1 tablet by mouth at bedtime.  06/08/12   Historical Provider, MD  simvastatin (ZOCOR) 10 MG tablet Take 10 mg by mouth at bedtime.    Historical Provider, MD  zolpidem (AMBIEN) 10 MG tablet  07/05/12   Historical Provider, MD   BP 103/60  Pulse 100  Temp(Src) 98.4 F (36.9 C) (Oral)  Resp 20  Ht 5\' 6"  (1.676 m)  Wt 202 lb (91.627 kg)  BMI 32.62 kg/m2  SpO2 94% Physical Exam  Nursing note and vitals reviewed. Constitutional: She appears well-developed and well-nourished. No distress.  HENT:  Head: Normocephalic and atraumatic.  Mouth/Throat: No oropharyngeal exudate.  Dry mucous membranes  Eyes: Conjunctivae and EOM are normal. Pupils are  equal, round, and reactive to light. Right eye exhibits no discharge. Left eye exhibits no discharge. No scleral icterus.  Neck: Normal range of motion. Neck supple. No JVD present. No thyromegaly present.  Cardiovascular: Regular rhythm and intact distal pulses.  Exam reveals no gallop and no friction rub.   Murmur heard. Tachycardic, strong pulses, no JVD, loud systolic murmur  Pulmonary/Chest: Effort normal and breath sounds normal. No respiratory distress. She has no wheezes. She has no rales.  Abdominal: Soft. Bowel sounds are normal. She exhibits no distension and no mass. There is tenderness ( Mild epigastric tenderness, no right upper quadrant tenderness).  Musculoskeletal: Normal range of motion. She exhibits no edema and no tenderness.  Lymphadenopathy:    She has no cervical adenopathy.  Neurological: She is alert. Coordination normal.  Skin: Skin is warm and dry. No rash noted. No erythema.  Psychiatric: She has a normal mood and affect. Her behavior is normal.    ED Course  Procedures (including critical care time) Labs Review Labs Reviewed  COMPREHENSIVE METABOLIC PANEL - Abnormal; Notable for the following:    Potassium 3.6 (*)    Glucose, Bld 155 (*)    BUN 24 (*)    GFR calc non Af Amer 65 (*)    GFR calc Af Amer 75 (*)    All other components within normal limits  CBC WITH DIFFERENTIAL - Abnormal; Notable for the following:    Neutrophils Relative % 94 (*)    Neutro Abs 9.4 (*)    Lymphocytes Relative 2 (*)    Lymphs Abs 0.2 (*)    All other components within normal limits    Imaging Review No results found.    MDM   Final diagnoses:  Nausea vomiting and diarrhea    The patient appears to be dehydrated with dry mucous membranes and tachycardia. This is consistent with her history of nausea vomiting and diarrhea this afternoon. Her vital signs are unremarkable except for her pulse which is reasonable given her dehydrated status. IV fluids, antiemetics,  pain medication, basic labs to rule out electrolyte abnormalities. The patient is in agreement with the plan.  Multiple doses of anti emetics and fluids boluses X 2.5 with improvement but pt still has ongoing nausea - she has normal lab w/u and VS have improved - now with no tachycardia - no fever, no leukocytosis and no hypotension.  She will be d/c home with  meds as below to f/u as outpt.  Meds given in ED:  Medications  ondansetron (ZOFRAN) 4 MG/2ML injection (4 mg  Given 09/17/13 0247)  promethazine (PHENERGAN) injection 25 mg (25 mg Intravenous Given 09/17/13 0324)  fentaNYL (SUBLIMAZE) injection 50 mcg (50 mcg Intravenous Given 09/17/13  6659)  sodium chloride 0.9 % bolus 1,000 mL (0 mLs Intravenous Stopped 09/17/13 0358)  ondansetron (ZOFRAN) injection 4 mg (4 mg Intravenous Given 09/17/13 0506)  sodium chloride 0.9 % bolus 1,000 mL (0 mLs Intravenous Stopped 09/17/13 0645)  promethazine (PHENERGAN) injection 25 mg (25 mg Intravenous Given 09/17/13 0534)    New Prescriptions   ONDANSETRON (ZOFRAN ODT) 4 MG DISINTEGRATING TABLET    Take 1 tablet (4 mg total) by mouth every 8 (eight) hours as needed for nausea.   PROMETHAZINE (PHENERGAN) 25 MG SUPPOSITORY    Place 1 suppository (25 mg total) rectally every 6 (six) hours as needed for nausea or vomiting.   PROMETHAZINE (PHENERGAN) 25 MG TABLET    Take 1 tablet (25 mg total) by mouth every 6 (six) hours as needed for nausea or vomiting.        Johnna Acosta, MD 09/17/13 [phone]

## 2014-04-02 ENCOUNTER — Encounter (HOSPITAL_COMMUNITY): Payer: Self-pay | Admitting: Vascular Surgery

## 2014-05-29 ENCOUNTER — Ambulatory Visit (INDEPENDENT_AMBULATORY_CARE_PROVIDER_SITE_OTHER): Payer: BLUE CROSS/BLUE SHIELD | Admitting: Cardiology

## 2014-05-29 ENCOUNTER — Encounter: Payer: Self-pay | Admitting: Cardiology

## 2014-05-29 VITALS — BP 101/69 | HR 82 | Ht 66.0 in | Wt 201.0 lb

## 2014-05-29 DIAGNOSIS — I350 Nonrheumatic aortic (valve) stenosis: Secondary | ICD-10-CM

## 2014-05-29 DIAGNOSIS — R002 Palpitations: Secondary | ICD-10-CM

## 2014-05-29 DIAGNOSIS — R079 Chest pain, unspecified: Secondary | ICD-10-CM

## 2014-05-29 DIAGNOSIS — I10 Essential (primary) hypertension: Secondary | ICD-10-CM

## 2014-05-29 NOTE — Progress Notes (Addendum)
Clinical Summary [name] is a 59 y.o.female seen today as a new patient for the following medical problems  1. Aortic stenosis - she reports hx in 1968 at age of 70 of supravavlular aortic stenosis, corrective surgery with patching at that time.  - denies any chest pain, no syncope, no presyncope - last cardio visit 10 years ago.   2. Elevated bp - elevated 158/93 during health fair at work.  - notes increased stress primarily at work.  - benazepril increased to 40mg  daily by pcp  3. Chest pain  - occurs daily. Can occur at rest or activity. Sharp pain midchest, 2-3/10. + headaches, throbbing feeling in chest. Last few minutes. Can feel a thumping feeling. Can feel SOB. No positonal. Tends to occur at work, often triggered by stress. - drinks coffee 2-3 cups, drinks tea/green tea daily (stopped), no sodas, no energy, red wine nightly. - can have some DOE at times.   4. PAD - followed by vascular - hx of left external iliac stent    Past Medical History  Diagnosis Date  . Hypertension   . Hyperlipidemia   . GERD (gastroesophageal reflux disease)   . Supravalvular aortic stenosis   . DVT (deep venous thrombosis)   . Insomnia, unspecified   . Muscle weakness (generalized)   . CAD (coronary artery disease)   . Peripheral artery disease      Allergies  Allergen Reactions  . Penicillins Hives    Have taken Keflex before without any adverse reaction.   . Sulfa Antibiotics     Unknown reaction     Current Outpatient Prescriptions  Medication Sig Dispense Refill  . aspirin 81 MG tablet Take 81 mg by mouth at bedtime.     . benazepril (LOTENSIN) 20 MG tablet Take 40 mg by mouth at bedtime.     . calcium-vitamin D (OSCAL WITH D) 500-200 MG-UNIT per tablet Take 1 tablet by mouth at bedtime.    . clonazePAM (KLONOPIN) 0.5 MG tablet Take 1 tablet (0.5 mg total) by mouth at bedtime. 30 tablet 5  . cyclobenzaprine (FLEXERIL) 10 MG tablet Take 10 mg by mouth daily.      . Flaxseed, Linseed, (FLAXSEED OIL PO) Take 1 tablet by mouth at bedtime.     . Multiple Vitamins-Minerals (MULTIVITAMIN WITH MINERALS) tablet Take 1 tablet by mouth at bedtime.     . ondansetron (ZOFRAN ODT) 4 MG disintegrating tablet Take 1 tablet (4 mg total) by mouth every 8 (eight) hours as needed for nausea. 10 tablet 0  . promethazine (PHENERGAN) 25 MG suppository Place 1 suppository (25 mg total) rectally every 6 (six) hours as needed for nausea or vomiting. 12 each 0  . promethazine (PHENERGAN) 25 MG tablet Take 1 tablet (25 mg total) by mouth every 6 (six) hours as needed for nausea or vomiting. 12 tablet 0  . RABEprazole (ACIPHEX) 20 MG tablet Take 1 tablet by mouth at bedtime.     . simvastatin (ZOCOR) 10 MG tablet Take 10 mg by mouth at bedtime.    [name] zolpidem (AMBIEN) 10 MG tablet      No current facility-administered medications for this visit.     Past Surgical History  Procedure Laterality Date  . Iliac artery stent Left 12/2007  . Cardiac valve surgery  1968  . Cholecystectomy    . Cervical fusion    . Rotator cuff repair Bilateral   . Abdominal aortagram  06/24/12  . Abdominal aortagram N/A  06/24/2012    Procedure: ABDOMINAL Maxcine Ham;  Surgeon: Angelia Mould, MD;  Location: [facility] CATH LAB;  Service: Cardiovascular;  Laterality: N/A;     Allergies  Allergen Reactions  . Penicillins Hives    Have taken Keflex before without any adverse reaction.   . Sulfa Antibiotics     Unknown reaction      Family History  Problem Relation Age of Onset  . Hypertension Mother   . Hypertension Father   . Heart disease Father     before age 22  . Other Father     varicose veins     Social History [name] reports that she quit smoking about 5 years ago. She has never used smokeless tobacco. [name] reports that she drinks about 2.4 oz of alcohol per week.   Review of Systems CONSTITUTIONAL: No weight loss, fever, chills, weakness or fatigue.  HEENT: Eyes:  No visual loss, blurred vision, double vision or yellow sclerae.No hearing loss, sneezing, congestion, runny nose or sore throat.  SKIN: No rash or itching.  CARDIOVASCULAR: per HPI RESPIRATORY: No shortness of breath, cough or sputum.  GASTROINTESTINAL: No anorexia, nausea, vomiting or diarrhea. No abdominal pain or blood.  GENITOURINARY: No burning on urination, no polyuria NEUROLOGICAL: No headache, dizziness, syncope, paralysis, ataxia, numbness or tingling in the extremities. No change in bowel or bladder control.  MUSCULOSKELETAL: No muscle, back pain, joint pain or stiffness.  LYMPHATICS: No enlarged nodes. No history of splenectomy.  PSYCHIATRIC: No history of depression or anxiety.  ENDOCRINOLOGIC: No reports of sweating, cold or heat intolerance. No polyuria or polydipsia.  [name]   Physical Examination p 82 bp 101/69 Wt 201 lbs BMI 32 Gen: resting comfortably, no acute distress HEENT: no scleral icterus, pupils equal round and reactive, no palptable cervical adenopathy,  CV: RRR, 3/6 systolic murmur RUSB, no JVD, no carotid bruits Resp: Clear to auscultation bilaterally GI: abdomen is soft, non-tender, non-distended, normal bowel sounds, no hepatosplenomegaly MSK: extremities are warm, no edema.  Skin: warm, no rash Neuro:  no focal deficits Psych: appropriate affect     Assessment and Plan   1. Aortic stenosis - she reports history of supravalvular AS with corrective surgery at age 64 - loud systolic murmur, will obtain echo.  2. HTN - at goal, continue current meds  3. PAD - continue to follow with vascular  4. Chest pain/palpitations - symptoms consistent with symptomatic palpitations, typically occur at work and brought on by stress. Fairly high caffeine intake - discussed home monitor, she is in favor of cutting back caffeine and following symptoms at this time, is persist then consider monitor at that time.   F/u 1 month  Arnoldo Lenis,  M.D.    07/20/14 Addendum  TEE completed, evidence of severe AS. She has history of supravalvular aortic stenosis with patch repair roughly 50 years ago, several views taken of the aortic root and proximal ascending aorta without evidence of supravalvular obstruction, appears to be valvular stenosis. She reports 6 months of worsening SOB. Example walking 1 block from her work parking lot to building causes noticeable dyspnea. She was raking leaves recently and had to take several breaks due to SOB. Will refer her for left and right heart cath, will also ask for aortogram to further verify lack of supravalvular obstruction. Will need valve study as well at time of cath.  Pending results likely refer to CT surgery.   [name] MD

## 2014-05-29 NOTE — Patient Instructions (Signed)
Your physician recommends that you schedule a follow-up appointment in: 1 month with Dr. Harl Bowie  Your physician has requested that you have an echocardiogram. Echocardiography is a painless test that uses sound waves to create images of your heart. It provides your doctor with information about the size and shape of your heart and how well your heart's chambers and valves are working. This procedure takes approximately one hour. There are no restrictions for this procedure.  Your physician recommends that you continue on your current medications as directed. Please refer to the Current Medication list given to you today.  [place] TEST FROM [facility] 2011  Thank you for choosing [facility]!!

## 2014-06-10 ENCOUNTER — Other Ambulatory Visit: Payer: Self-pay

## 2014-06-10 ENCOUNTER — Other Ambulatory Visit (INDEPENDENT_AMBULATORY_CARE_PROVIDER_SITE_OTHER): Payer: BLUE CROSS/BLUE SHIELD

## 2014-06-10 DIAGNOSIS — R002 Palpitations: Secondary | ICD-10-CM | POA: Diagnosis not present

## 2014-06-10 DIAGNOSIS — I350 Nonrheumatic aortic (valve) stenosis: Secondary | ICD-10-CM

## 2014-06-10 DIAGNOSIS — R079 Chest pain, unspecified: Secondary | ICD-10-CM | POA: Diagnosis not present

## 2014-06-15 ENCOUNTER — Telehealth: Payer: Self-pay | Admitting: *Deleted

## 2014-06-15 NOTE — Telephone Encounter (Signed)
-----   Message from Arnoldo Lenis, MD sent at 06/15/2014  2:09 PM EST ----- Please let patient know that her echo shows that her aortic valve does not appear to be opening very well, we need to do a TEE to better visualize her aortic valve and also the area that was previously surgically repaired. Please arrange a TEE for one of my hospital days.   [name] MD

## 2014-06-15 NOTE — Telephone Encounter (Signed)
Will forward to schedulers and advise pt when scheduled with day and time.

## 2014-06-16 ENCOUNTER — Telehealth: Payer: Self-pay | Admitting: *Deleted

## 2014-06-16 ENCOUNTER — Encounter: Payer: Self-pay | Admitting: *Deleted

## 2014-06-16 NOTE — Telephone Encounter (Signed)
Pt made aware and is upset due to her starting work after next week after being on short term disability. Pt asked if she could call back after she thought about this. TEE is scheduled for 07/07/2014 at 9:30 with pt arriving at 8:30am. Will forward to Dr. Harl Bowie. Pt is on ASA 81 mg does she need to be anticoagulated prior to TEE?

## 2014-06-16 NOTE — Telephone Encounter (Signed)
-----   Message from Arnoldo Lenis, MD sent at 06/15/2014  2:09 PM EST ----- Please let patient know that her echo shows that her aortic valve does not appear to be opening very well, we need to do a TEE to better visualize her aortic valve and also the area that was previously surgically repaired. Please arrange a TEE for one of my hospital days.   Michelle Abts MD

## 2014-06-16 NOTE — Telephone Encounter (Signed)
No need for anticoag, she is to have just a TEE, does not need a cardioversion. She can see me in clinic if she wishes to discuss in further detail. Currently its the best way to sort out whats going on with her aortic valve   Michelle Abts MD

## 2014-06-17 ENCOUNTER — Encounter: Payer: Self-pay | Admitting: *Deleted

## 2014-06-17 NOTE — Telephone Encounter (Signed)
Pt will return to work as scheduled next week. Pt was concerned over taking off work so soon after starting back. Will FYI Dr. Harl Bowie

## 2014-06-17 NOTE — Telephone Encounter (Signed)
F/u with pt and explained in detail TEE. Also mailed pt letter of instruction. Pt was offered appt with Dr. Harl Bowie to discuss further, pt declined. Stated she was concerned because she was surprised as she was expecting [place] to tell her she was ok to return to work after echo. Pt thanked [place] for following up with her. Will forward to Dr. Harl Bowie as Juluis Rainier

## 2014-06-17 NOTE — Telephone Encounter (Signed)
Her ability to work is more based on any symptoms she may be having. From what we see there is no absolute reason she cannot go back to work if she is feeling up to it from a heart standpoint.  [name] MD

## 2014-07-06 ENCOUNTER — Other Ambulatory Visit: Payer: Self-pay | Admitting: Cardiology

## 2014-07-06 DIAGNOSIS — I350 Nonrheumatic aortic (valve) stenosis: Secondary | ICD-10-CM

## 2014-07-07 ENCOUNTER — Encounter (HOSPITAL_COMMUNITY)
Admission: RE | Disposition: A | Discharge status: Home / Self Care | Payer: Self-pay | Source: Ambulatory Visit | Attending: Cardiology

## 2014-07-07 ENCOUNTER — Ambulatory Visit (HOSPITAL_COMMUNITY)
Admission: RE | Admit: 2014-07-07 | Discharge: 2014-07-07 | Disposition: A | Discharge status: Home / Self Care | Payer: BLUE CROSS/BLUE SHIELD | Source: Ambulatory Visit | Attending: Cardiology | Admitting: Cardiology

## 2014-07-07 ENCOUNTER — Telehealth: Payer: Self-pay | Admitting: Cardiology

## 2014-07-07 ENCOUNTER — Encounter (HOSPITAL_COMMUNITY): Payer: Self-pay | Admitting: Anesthesiology

## 2014-07-07 ENCOUNTER — Encounter (HOSPITAL_COMMUNITY): Payer: Self-pay | Admitting: *Deleted

## 2014-07-07 DIAGNOSIS — E785 Hyperlipidemia, unspecified: Secondary | ICD-10-CM | POA: Diagnosis not present

## 2014-07-07 DIAGNOSIS — I10 Essential (primary) hypertension: Secondary | ICD-10-CM | POA: Insufficient documentation

## 2014-07-07 DIAGNOSIS — I739 Peripheral vascular disease, unspecified: Secondary | ICD-10-CM | POA: Diagnosis not present

## 2014-07-07 DIAGNOSIS — Z9049 Acquired absence of other specified parts of digestive tract: Secondary | ICD-10-CM | POA: Diagnosis not present

## 2014-07-07 DIAGNOSIS — G4700 Insomnia, unspecified: Secondary | ICD-10-CM | POA: Diagnosis not present

## 2014-07-07 DIAGNOSIS — Z882 Allergy status to sulfonamides status: Secondary | ICD-10-CM | POA: Diagnosis not present

## 2014-07-07 DIAGNOSIS — K219 Gastro-esophageal reflux disease without esophagitis: Secondary | ICD-10-CM | POA: Diagnosis not present

## 2014-07-07 DIAGNOSIS — Z538 Procedure and treatment not carried out for other reasons: Secondary | ICD-10-CM | POA: Diagnosis not present

## 2014-07-07 DIAGNOSIS — Z87891 Personal history of nicotine dependence: Secondary | ICD-10-CM | POA: Diagnosis not present

## 2014-07-07 DIAGNOSIS — Z7982 Long term (current) use of aspirin: Secondary | ICD-10-CM | POA: Diagnosis not present

## 2014-07-07 DIAGNOSIS — Z880 Allergy status to penicillin: Secondary | ICD-10-CM | POA: Diagnosis not present

## 2014-07-07 DIAGNOSIS — Z86718 Personal history of other venous thrombosis and embolism: Secondary | ICD-10-CM | POA: Insufficient documentation

## 2014-07-07 DIAGNOSIS — I2510 Atherosclerotic heart disease of native coronary artery without angina pectoris: Secondary | ICD-10-CM | POA: Insufficient documentation

## 2014-07-07 DIAGNOSIS — I350 Nonrheumatic aortic (valve) stenosis: Secondary | ICD-10-CM | POA: Diagnosis present

## 2014-07-07 HISTORY — PX: TEE WITHOUT CARDIOVERSION: SHX5443

## 2014-07-07 SURGERY — ECHOCARDIOGRAM, TRANSESOPHAGEAL
Anesthesia: Monitor Anesthesia Care

## 2014-07-07 MED ORDER — FENTANYL CITRATE 0.05 MG/ML IJ SOLN
INTRAMUSCULAR | Status: AC
  Filled 2014-07-07: qty 2

## 2014-07-07 MED ORDER — MIDAZOLAM HCL 5 MG/5ML IJ SOLN
INTRAMUSCULAR | Status: AC
  Filled 2014-07-07: qty 10

## 2014-07-07 MED ORDER — LIDOCAINE VISCOUS 2 % MT SOLN
OROMUCOSAL | Status: DC | PRN
  Administered 2014-07-07: 5 mL via OROMUCOSAL

## 2014-07-07 MED ORDER — MIDAZOLAM HCL 5 MG/5ML IJ SOLN
INTRAMUSCULAR | Status: DC | PRN
  Administered 2014-07-07 (×3): 0.5 mg via INTRAVENOUS
  Administered 2014-07-07 (×3): 1 mg via INTRAVENOUS
  Administered 2014-07-07: 0.5 mg via INTRAVENOUS

## 2014-07-07 MED ORDER — SODIUM CHLORIDE BACTERIOSTATIC 0.9 % IJ SOLN
INTRAMUSCULAR | Status: AC
  Filled 2014-07-07: qty 30

## 2014-07-07 MED ORDER — FENTANYL CITRATE 0.05 MG/ML IJ SOLN
INTRAMUSCULAR | Status: AC
  Filled 2014-07-07: qty 4

## 2014-07-07 MED ORDER — FENTANYL CITRATE 0.05 MG/ML IJ SOLN
INTRAMUSCULAR | Status: DC | PRN
  Administered 2014-07-07: 50 ug via INTRAVENOUS
  Administered 2014-07-07: 25 ug via INTRAVENOUS
  Administered 2014-07-07 (×2): 50 ug via INTRAVENOUS
  Administered 2014-07-07 (×2): 25 ug via INTRAVENOUS

## 2014-07-07 MED ORDER — SODIUM CHLORIDE 0.9 % IV SOLN
INTRAVENOUS | Status: DC
  Administered 2014-07-07: 09:00:00 via INTRAVENOUS

## 2014-07-07 MED ORDER — LIDOCAINE VISCOUS 2 % MT SOLN
OROMUCOSAL | Status: DC
Start: 2014-07-07 — End: 2014-07-07
  Filled 2014-07-07: qty 15

## 2014-07-07 MED ORDER — BUTAMBEN-TETRACAINE-BENZOCAINE 2-2-14 % EX AERO
INHALATION_SPRAY | CUTANEOUS | Status: DC | PRN
  Administered 2014-07-07: 2 via TOPICAL

## 2014-07-07 NOTE — H&P (Signed)
Procedure H&P  Please see recent clinic note below for full medical history. 59 yo female hx of supravalvular aortic stenosis s/p prior surgery with recent TTE showing high gradient across the aortic valve. From TTE pictures unable to discern if valvular or supravavular, will plan for TEE to further evaluate.    [name] MD     Clinical Summary Michelle Patton is a 59 y.o.female seen today as a new patient for the following medical problems  1. Aortic stenosis - she reports hx in 1968 at age of 59 of supravavlular aortic stenosis, corrective surgery with patching at that time.  - denies any chest pain, no syncope, no presyncope - last cardio visit 10 years ago.   2. Elevated bp - elevated 158/93 during health fair at work.  - notes increased stress primarily at work.  - benazepril increased to 40mg  daily by pcp  3. Chest pain  - occurs daily. Can occur at rest or activity. Sharp pain midchest, 2-3/10. + headaches, throbbing feeling in chest. Last few minutes. Can feel a thumping feeling. Can feel SOB. No positonal. Tends to occur at work, often triggered by stress. - drinks coffee 2-3 cups, drinks tea/green tea daily (stopped), no sodas, no energy, red wine nightly. - can have some DOE at times.   4. PAD - followed by vascular - hx of left external iliac stent    Past Medical History  Diagnosis Date  . Hypertension   . Hyperlipidemia   . GERD (gastroesophageal reflux disease)   . Supravalvular aortic stenosis   . DVT (deep venous thrombosis)   . Insomnia, unspecified   . Muscle weakness (generalized)   . CAD (coronary artery disease)   . Peripheral artery disease      Allergies  Allergen Reactions  . Penicillins Hives    Have taken Keflex before without any adverse reaction.   . Sulfa Antibiotics     Unknown reaction     Current Outpatient Prescriptions  Medication Sig Dispense Refill  . aspirin 81 MG  tablet Take 81 mg by mouth at bedtime.     . benazepril (LOTENSIN) 20 MG tablet Take 40 mg by mouth at bedtime.     . calcium-vitamin D (OSCAL WITH D) 500-200 MG-UNIT per tablet Take 1 tablet by mouth at bedtime.    . clonazePAM (KLONOPIN) 0.5 MG tablet Take 1 tablet (0.5 mg total) by mouth at bedtime. 30 tablet 5  . cyclobenzaprine (FLEXERIL) 10 MG tablet Take 10 mg by mouth daily.    . Flaxseed, Linseed, (FLAXSEED OIL PO) Take 1 tablet by mouth at bedtime.     . Multiple Vitamins-Minerals (MULTIVITAMIN WITH MINERALS) tablet Take 1 tablet by mouth at bedtime.     . ondansetron (ZOFRAN ODT) 4 MG disintegrating tablet Take 1 tablet (4 mg total) by mouth every 8 (eight) hours as needed for nausea. 10 tablet 0  . promethazine (PHENERGAN) 25 MG suppository Place 1 suppository (25 mg total) rectally every 6 (six) hours as needed for nausea or vomiting. 12 each 0  . promethazine (PHENERGAN) 25 MG tablet Take 1 tablet (25 mg total) by mouth every 6 (six) hours as needed for nausea or vomiting. 12 tablet 0  . RABEprazole (ACIPHEX) 20 MG tablet Take 1 tablet by mouth at bedtime.     . simvastatin (ZOCOR) 10 MG tablet Take 10 mg by mouth at bedtime.    [name] zolpidem (AMBIEN) 10 MG tablet      No current facility-administered medications  for this visit.     Past Surgical History  Procedure Laterality Date  . Iliac artery stent Left 12/2007  . Cardiac valve surgery  1968  . Cholecystectomy    . Cervical fusion    . Rotator cuff repair Bilateral   . Abdominal aortagram  06/24/12  . Abdominal aortagram N/A 06/24/2012    Procedure: ABDOMINAL Maxcine Ham; Surgeon: Angelia Mould, MD; Location: [facility] CATH LAB; Service: Cardiovascular; Laterality: N/A;     Allergies  Allergen Reactions  . Penicillins Hives    Have taken Keflex before without any adverse reaction.   . Sulfa Antibiotics      Unknown reaction      Family History  Problem Relation Age of Onset  . Hypertension Mother   . Hypertension Father   . Heart disease Father     before age 54  . Other Father     varicose veins     Social History Michelle Patton reports that she quit smoking about 5 years ago. She has never used smokeless tobacco. Michelle Patton reports that she drinks about 2.4 oz of alcohol per week.   Review of Systems CONSTITUTIONAL: No weight loss, fever, chills, weakness or fatigue.  HEENT: Eyes: No visual loss, blurred vision, double vision or yellow sclerae.No hearing loss, sneezing, congestion, runny nose or sore throat.  SKIN: No rash or itching.  CARDIOVASCULAR: per HPI RESPIRATORY: No shortness of breath, cough or sputum.  GASTROINTESTINAL: No anorexia, nausea, vomiting or diarrhea. No abdominal pain or blood.  GENITOURINARY: No burning on urination, no polyuria NEUROLOGICAL: No headache, dizziness, syncope, paralysis, ataxia, numbness or tingling in the extremities. No change in bowel or bladder control.  MUSCULOSKELETAL: No muscle, back pain, joint pain or stiffness.  LYMPHATICS: No enlarged nodes. No history of splenectomy.  PSYCHIATRIC: No history of depression or anxiety.  ENDOCRINOLOGIC: No reports of sweating, cold or heat intolerance. No polyuria or polydipsia.  [name]   Physical Examination p 82 bp 101/69 Wt 201 lbs BMI 32 Gen: resting comfortably, no acute distress HEENT: no scleral icterus, pupils equal round and reactive, no palptable cervical adenopathy,  CV: RRR, 3/6 systolic murmur RUSB, no JVD, no carotid bruits Resp: Clear to auscultation bilaterally GI: abdomen is soft, non-tender, non-distended, normal bowel sounds, no hepatosplenomegaly MSK: extremities are warm, no edema.  Skin: warm, no rash Neuro: no focal deficits Psych: appropriate affect     Assessment and Plan   1. Aortic stenosis - she reports history of  supravalvular AS with corrective surgery at age 59 - loud systolic murmur, will obtain echo.  2. HTN - at goal, continue current meds  3. PAD - continue to follow with vascular  4. Chest pain/palpitations - symptoms consistent with symptomatic palpitations, typically occur at work and brought on by stress. Fairly high caffeine intake - discussed home monitor, she is in favor of cutting back caffeine and following symptoms at this time, is persist then consider monitor at that time.   F/u 1 month  Arnoldo Lenis, M.D.

## 2014-07-07 NOTE — Discharge Instructions (Signed)
Transesophageal Echocardiogram Transesophageal echocardiography (TEE) is a picture test of your heart using sound waves. The pictures taken can give very detailed pictures of your heart. This can help your doctor see if there are problems with your heart. TEE can check:  If your heart has blood clots in it.  How well your heart valves are working.  If you have an infection on the inside of your heart.  Some of the major arteries of your heart.  If your heart valve is working after a Office manager.  Your heart before a procedure that uses a shock to your heart to get the rhythm back to normal. BEFORE THE PROCEDURE  Do not eat or drink for 6 hours before the procedure or as told by your doctor.  Make plans to have someone drive you home after the procedure. Do not drive yourself home.  An IV tube will be put in your arm. PROCEDURE  You will be given a medicine to help you relax (sedative). It will be given through the IV tube.  A numbing medicine will be sprayed or gargled in the back of your throat to help numb it.  The tip of the probe is placed into the back of your mouth. You will be asked to swallow. This helps to pass the probe into your esophagus.  Once the tip of the probe is in the right place, your doctor can take pictures of your heart.  You may feel pressure at the back of your throat. AFTER THE PROCEDURE  You will be taken to a recovery area so the sedative can wear off.  Your throat may be sore and scratchy. This will go away slowly over time.  You will go home when you are fully awake and able to swallow liquids.  You should have someone stay with you for the next 24 hours.  Do not drive or operate machinery for the next 24 hours.

## 2014-07-07 NOTE — Telephone Encounter (Signed)
Please let her know that i notified our scheduler earlier this morning and she will be in touch to get the procedure scheduled. We have to coordinate it with anethesiology's schedule also.  Can push back her f/u appt to late April   J Staci Dack MD

## 2014-07-07 NOTE — Procedures (Signed)
Procedure: TEE Physician: Dr Carlyle Dolly MD Indication: Aortic stenosis  The patient was brought to the endoscopy suite after appropriate consent was obtained. The oropharnyx was anesthesized with 2% viscous lidocaine spray and cetacaine spray. A bite block was placed and the patient was placed in the lateral decubitus position. A total of 5mg  of versed and 221mcg of fentanyl were given over the procedure to achieve moderate sedation. However, despite high doses of sedatives we were unable to get her comfortable to the point to allow [place] to pass the probe. Unable to intubate the probe due to patient resistance, discomfort, and movement. The procedure was cancelled and will be rescheduled with the assistance of anesthesia.   [name] MD

## 2014-07-07 NOTE — Telephone Encounter (Signed)
Pt made aware, rescheduled OV for May 9th earliest available in [place].

## 2014-07-07 NOTE — Telephone Encounter (Signed)
Patient was not able to have TEE done this morning she would like to know what is the next step as well as if upcoming appt is needed  Also would like to know if she will be billed for today

## 2014-07-07 NOTE — Telephone Encounter (Signed)
PT wanting to know when we will reschedule TEE with anesthesia. Will forward to Dr. Harl Bowie. We need to reschedule pts 07/13/14 appt. Pt would like to be seen in [facility] office.

## 2014-07-08 ENCOUNTER — Telehealth: Payer: Self-pay | Admitting: *Deleted

## 2014-07-08 NOTE — Telephone Encounter (Signed)
Pt wanting to schedule TEE for next Friday when she is already scheduled to be off work and not have to take another day off. Pt will need anesthesiologist per Dr. Harl Bowie. Pt is awaiting FMLA papers to be filed and asked to speak to scheduler. Transferred pt. Will forward to schedulers to see if TEE can be scheduled next Friday

## 2014-07-09 ENCOUNTER — Telehealth: Payer: Self-pay | Admitting: *Deleted

## 2014-07-09 ENCOUNTER — Encounter: Payer: Self-pay | Admitting: *Deleted

## 2014-07-09 ENCOUNTER — Encounter (HOSPITAL_COMMUNITY): Payer: Self-pay | Admitting: Cardiology

## 2014-07-09 NOTE — Telephone Encounter (Signed)
Pt made aware and mailed instruction letter with date and time.

## 2014-07-09 NOTE — Telephone Encounter (Signed)
-----   Message from Priscille Loveless sent at 07/08/2014  4:41 PM EDT ----- Regarding: FW: TEE I scheduled this appointment for 07/20/14 @ 9:00am and Lexine Baton is calling patient with date and time.Marina Gravel, Coralyn Mark  ----- Message -----    From: Arnoldo Lenis, MD    Sent: 07/07/2014  10:47 AM      To: Priscille Loveless Subject: RE: TEE                                        Coralyn Mark, can you reschedule this procedure with anesthesia. We were unable to get her sedated enough and will require there assistance   [name] MD ----- Message -----    From: Priscille Loveless    Sent: 06/16/2014  10:14 AM      To: Arnoldo Lenis, MD Subject: TEE                                            To be done Tuesday March 15th @ 0930 @ [facility].[name]  Thanks, [employer]

## 2014-07-10 ENCOUNTER — Other Ambulatory Visit (HOSPITAL_COMMUNITY): Payer: BLUE CROSS/BLUE SHIELD

## 2014-07-13 ENCOUNTER — Ambulatory Visit: Payer: Self-pay | Admitting: Cardiology

## 2014-07-16 ENCOUNTER — Other Ambulatory Visit (HOSPITAL_COMMUNITY): Payer: BLUE CROSS/BLUE SHIELD

## 2014-07-17 ENCOUNTER — Other Ambulatory Visit: Payer: Self-pay | Admitting: Cardiology

## 2014-07-17 DIAGNOSIS — I350 Nonrheumatic aortic (valve) stenosis: Secondary | ICD-10-CM

## 2014-07-20 ENCOUNTER — Encounter (HOSPITAL_COMMUNITY): Payer: Self-pay | Admitting: *Deleted

## 2014-07-20 ENCOUNTER — Ambulatory Visit (HOSPITAL_COMMUNITY)
Admission: RE | Admit: 2014-07-20 | Discharge: 2014-07-20 | Disposition: A | Discharge status: Home / Self Care | Payer: BLUE CROSS/BLUE SHIELD | Source: Ambulatory Visit | Attending: Cardiology | Admitting: Cardiology

## 2014-07-20 ENCOUNTER — Encounter (HOSPITAL_COMMUNITY)
Admission: RE | Disposition: A | Discharge status: Home / Self Care | Payer: Self-pay | Source: Ambulatory Visit | Attending: Cardiology

## 2014-07-20 ENCOUNTER — Other Ambulatory Visit (HOSPITAL_COMMUNITY): Payer: BLUE CROSS/BLUE SHIELD

## 2014-07-20 ENCOUNTER — Ambulatory Visit (HOSPITAL_COMMUNITY): Payer: BLUE CROSS/BLUE SHIELD | Admitting: Anesthesiology

## 2014-07-20 DIAGNOSIS — I350 Nonrheumatic aortic (valve) stenosis: Secondary | ICD-10-CM

## 2014-07-20 DIAGNOSIS — I351 Nonrheumatic aortic (valve) insufficiency: Secondary | ICD-10-CM

## 2014-07-20 DIAGNOSIS — Z87891 Personal history of nicotine dependence: Secondary | ICD-10-CM | POA: Diagnosis not present

## 2014-07-20 DIAGNOSIS — I739 Peripheral vascular disease, unspecified: Secondary | ICD-10-CM | POA: Insufficient documentation

## 2014-07-20 DIAGNOSIS — Z7982 Long term (current) use of aspirin: Secondary | ICD-10-CM | POA: Insufficient documentation

## 2014-07-20 DIAGNOSIS — I10 Essential (primary) hypertension: Secondary | ICD-10-CM | POA: Diagnosis not present

## 2014-07-20 DIAGNOSIS — Z79899 Other long term (current) drug therapy: Secondary | ICD-10-CM | POA: Diagnosis not present

## 2014-07-20 DIAGNOSIS — R079 Chest pain, unspecified: Secondary | ICD-10-CM | POA: Diagnosis not present

## 2014-07-20 DIAGNOSIS — R002 Palpitations: Secondary | ICD-10-CM | POA: Diagnosis not present

## 2014-07-20 HISTORY — DX: Nonrheumatic aortic (valve) insufficiency: I35.1

## 2014-07-20 HISTORY — DX: Nonrheumatic aortic (valve) stenosis: I35.0

## 2014-07-20 HISTORY — PX: TEE WITHOUT CARDIOVERSION: SHX5443

## 2014-07-20 LAB — POCT I-STAT 4, (NA,K, GLUC, HGB,HCT)
Glucose, Bld: 85 mg/dL (ref 70–99)
HCT: 41 % (ref 36.0–46.0)
Hemoglobin: 13.9 g/dL (ref 12.0–15.0)
Potassium: 4 mmol/L (ref 3.5–5.1)
SODIUM: 143 mmol/L (ref 135–145)

## 2014-07-20 SURGERY — ECHOCARDIOGRAM, TRANSESOPHAGEAL
Anesthesia: Monitor Anesthesia Care

## 2014-07-20 MED ORDER — LIDOCAINE VISCOUS 2 % MT SOLN
OROMUCOSAL | Status: AC
  Filled 2014-07-20: qty 15

## 2014-07-20 MED ORDER — FENTANYL CITRATE 0.05 MG/ML IJ SOLN
25.0000 ug | INTRAMUSCULAR | Status: AC
  Administered 2014-07-20 (×2): 25 ug via INTRAVENOUS

## 2014-07-20 MED ORDER — LACTATED RINGERS IV SOLN
INTRAVENOUS | Status: DC
  Administered 2014-07-20: 09:00:00 via INTRAVENOUS

## 2014-07-20 MED ORDER — ONDANSETRON HCL 4 MG/2ML IJ SOLN: 4.0000 mg | Freq: Once | INTRAMUSCULAR | Status: DC | PRN

## 2014-07-20 MED ORDER — PROPOFOL INFUSION 10 MG/ML OPTIME
INTRAVENOUS | Status: DC | PRN
  Administered 2014-07-20: 150 ug/kg/min via INTRAVENOUS

## 2014-07-20 MED ORDER — FENTANYL CITRATE 0.05 MG/ML IJ SOLN: 25.0000 ug | INTRAMUSCULAR | Status: DC | PRN

## 2014-07-20 MED ORDER — MIDAZOLAM HCL 2 MG/2ML IJ SOLN
1.0000 mg | INTRAMUSCULAR | Status: DC | PRN
  Administered 2014-07-20: 2 mg via INTRAVENOUS

## 2014-07-20 MED ORDER — MIDAZOLAM HCL 2 MG/2ML IJ SOLN
INTRAMUSCULAR | Status: AC
  Filled 2014-07-20: qty 2

## 2014-07-20 MED ORDER — FENTANYL CITRATE 0.05 MG/ML IJ SOLN
INTRAMUSCULAR | Status: AC
  Filled 2014-07-20: qty 2

## 2014-07-20 MED ORDER — ONDANSETRON HCL 4 MG/2ML IJ SOLN
4.0000 mg | Freq: Once | INTRAMUSCULAR | Status: AC
  Administered 2014-07-20: 4 mg via INTRAVENOUS

## 2014-07-20 MED ORDER — ONDANSETRON HCL 4 MG/2ML IJ SOLN
INTRAMUSCULAR | Status: AC
  Filled 2014-07-20: qty 2

## 2014-07-20 MED ORDER — PROPOFOL 10 MG/ML IV BOLUS
INTRAVENOUS | Status: AC
Start: 2014-07-20 — End: 2014-07-20
  Filled 2014-07-20: qty 20

## 2014-07-20 MED ORDER — MIDAZOLAM HCL 5 MG/5ML IJ SOLN
INTRAMUSCULAR | Status: DC | PRN
  Administered 2014-07-20: 1 mg via INTRAVENOUS

## 2014-07-20 MED ORDER — BUTAMBEN-TETRACAINE-BENZOCAINE 2-2-14 % EX AERO
INHALATION_SPRAY | CUTANEOUS | Status: AC
  Filled 2014-07-20: qty 56

## 2014-07-20 MED ORDER — LIDOCAINE VISCOUS 2 % MT SOLN
15.0000 mL | Freq: Once | OROMUCOSAL | Status: AC
  Administered 2014-07-20: 15 mL via OROMUCOSAL

## 2014-07-20 MED ORDER — LIDOCAINE HCL (PF) 1 % IJ SOLN
INTRAMUSCULAR | Status: AC
  Filled 2014-07-20: qty 5

## 2014-07-20 NOTE — Transfer of Care (Signed)
Immediate Anesthesia Transfer of Care Note  Patient: Michelle Patton  Procedure(s) Performed: Procedure(s): TRANSESOPHAGEAL ECHOCARDIOGRAM (TEE) WITH PROPOFOL (N/A)  Patient Location: PACU  Anesthesia Type:MAC  Level of Consciousness: awake  Airway & Oxygen Therapy: Patient Spontanous Breathing and Patient connected to face mask oxygen  Post-op Assessment: Report given to RN  Post vital signs: Reviewed and stable  Last Vitals:  Filed Vitals:   07/20/14 0910  BP: 108/54  Pulse:   Temp:   Resp: 37    Complications: No apparent anesthesia complications

## 2014-07-20 NOTE — Discharge Instructions (Signed)
°  Resume medications and diet Dr. Nelly Laurence office will notify you of follow up appointment     Transesophageal Echocardiogram Transesophageal echocardiography (TEE) is a picture test of your heart using sound waves. The pictures taken can give very detailed pictures of your heart. This can help your doctor see if there are problems with your heart. TEE can check:  If your heart has blood clots in it.  How well your heart valves are working.  If you have an infection on the inside of your heart.  Some of the major arteries of your heart.  If your heart valve is working after a Office manager.  Your heart before a procedure that uses a shock to your heart to get the rhythm back to normal. BEFORE THE PROCEDURE  Do not eat or drink for 6 hours before the procedure or as told by your doctor.  Make plans to have someone drive you home after the procedure. Do not drive yourself home.  An IV tube will be put in your arm. PROCEDURE  You will be given a medicine to help you relax (sedative). It will be given through the IV tube.  A numbing medicine will be sprayed or gargled in the back of your throat to help numb it.  The tip of the probe is placed into the back of your mouth. You will be asked to swallow. This helps to pass the probe into your esophagus.  Once the tip of the probe is in the right place, your doctor can take pictures of your heart.  You may feel pressure at the back of your throat. AFTER THE PROCEDURE  You will be taken to a recovery area so the sedative can wear off.  Your throat may be sore and scratchy. This will go away slowly over time.  You will go home when you are fully awake and able to swallow liquids.  You should have someone stay with you for the next 24 hours.  Do not drive or operate machinery for the next 24 hours.

## 2014-07-20 NOTE — Anesthesia Postprocedure Evaluation (Signed)
  Anesthesia Post-op Note  Patient: Michelle Patton  Procedure(s) Performed: Procedure(s): TRANSESOPHAGEAL ECHOCARDIOGRAM (TEE) WITH PROPOFOL (N/A)  Patient Location: PACU  Anesthesia Type:MAC  Level of Consciousness: awake and alert   Airway and Oxygen Therapy: Patient Spontanous Breathing  Post-op Pain: none  Post-op Assessment: Post-op Vital signs reviewed, Patient's Cardiovascular Status Stable, Respiratory Function Stable, Patent Airway and No signs of Nausea or vomiting  Post-op Vital Signs: Reviewed and stable  Last Vitals:  Filed Vitals:   07/20/14 0910  BP: 108/54  Pulse:   Temp:   Resp: 37    Complications: No apparent anesthesia complications

## 2014-07-20 NOTE — Progress Notes (Signed)
  Echocardiogram Echocardiogram Transesophageal has been performed.  Samuel Germany 07/20/2014, 10:32 AM

## 2014-07-20 NOTE — Anesthesia Preprocedure Evaluation (Signed)
Anesthesia Evaluation  Patient identified by MRN, date of birth, ID band Patient awake    Reviewed: Allergy & Precautions, NPO status , Patient's Chart, lab work & pertinent test results  Airway Mallampati: II  TM Distance: >3 FB     Dental  (+) Teeth Intact   Pulmonary Current Smoker,  breath sounds clear to auscultation        Cardiovascular hypertension, Pt. on medications + CAD and + Peripheral Vascular Disease CABG: iliac stent. + Valvular Problems/Murmurs AS Rhythm:Regular Rate:Normal     Neuro/Psych    GI/Hepatic GERD-  Medicated and Controlled,  Endo/Other    Renal/GU      Musculoskeletal   Abdominal   Peds  Hematology   Anesthesia Other Findings   Reproductive/Obstetrics                             Anesthesia Physical Anesthesia Plan  ASA: III  Anesthesia Plan: MAC   Post-op Pain Management:    Induction: Intravenous  Airway Management Planned: Simple Face Mask  Additional Equipment:   Intra-op Plan:   Post-operative Plan:   Informed Consent: I have reviewed the patients History and Physical, chart, labs and discussed the procedure including the risks, benefits and alternatives for the proposed anesthesia with the patient or authorized representative who has indicated his/her understanding and acceptance.     Plan Discussed with:   Anesthesia Plan Comments:         Anesthesia Quick Evaluation

## 2014-07-20 NOTE — Procedures (Signed)
Procedure: TEE Physican: Dr Carlyle Dolly MD Indication: Aortic stenosis   The patient was brought into the procedural suite after appropriate consent was obtained. The posterior oropharynx was anesthesized with 2% viscous lidocaine and cetacaine spray. Sedation was achieved with the assistance of anethesiology, please refer to there note for details. The patient has cardiopulomonary monitoring throughout the procedure.  The TEE probe was intubated into the esophagus without difficulty and several images were obtained. At the end of the procedure the TEE probe was removed and the patient was monitored until sedation wore off.  Please refer to TEE report for full findings. In general she has normal LV systolic function, apparent severe AS with peak velocity of 5 m/s, full report to include further gradients and areas.   [name] MD

## 2014-07-20 NOTE — H&P (Signed)
Procedure H&P  Please see recent clinic note below for full medical history. 59 yo female hx of supravalvular aortic stenosis s/p prior surgery with recent TTE showing high gradient across the aortic valve. Unclear from TTE images if gradient is valvular or supravalvular. Will plan for TEE today.  Michelle Abts MD    Clinical Summary [name] is a 59 y.o.female seen today as a new patient for the following medical problems  1. Aortic stenosis - she reports hx in 1968 at age of 71 of supravavlular aortic stenosis, corrective surgery with patching at that time.  - denies any chest pain, no syncope, no presyncope - last cardio visit 10 years ago.   2. Elevated bp - elevated 158/93 during health fair at work.  - notes increased stress primarily at work.  - benazepril increased to 40mg  daily by pcp  3. Chest pain  - occurs daily. Can occur at rest or activity. Sharp pain midchest, 2-3/10. + headaches, throbbing feeling in chest. Last few minutes. Can feel a thumping feeling. Can feel SOB. No positonal. Tends to occur at work, often triggered by stress. - drinks coffee 2-3 cups, drinks tea/green tea daily (stopped), no sodas, no energy, red wine nightly. - can have some DOE at times.   4. PAD - followed by vascular - hx of left external iliac stent    Past Medical History  Diagnosis Date  . Hypertension   . Hyperlipidemia   . GERD (gastroesophageal reflux disease)   . Supravalvular aortic stenosis   . DVT (deep venous thrombosis)   . Insomnia, unspecified   . Muscle weakness (generalized)   . CAD (coronary artery disease)   . Peripheral artery disease      Allergies  Allergen Reactions  . Penicillins Hives    Have taken Keflex before without any adverse reaction.   . Sulfa Antibiotics     Unknown reaction     Current Outpatient Prescriptions  Medication Sig Dispense Refill  . aspirin 81 MG tablet Take 81 mg  by mouth at bedtime.     . benazepril (LOTENSIN) 20 MG tablet Take 40 mg by mouth at bedtime.     . calcium-vitamin D (OSCAL WITH D) 500-200 MG-UNIT per tablet Take 1 tablet by mouth at bedtime.    . clonazePAM (KLONOPIN) 0.5 MG tablet Take 1 tablet (0.5 mg total) by mouth at bedtime. 30 tablet 5  . cyclobenzaprine (FLEXERIL) 10 MG tablet Take 10 mg by mouth daily.    . Flaxseed, Linseed, (FLAXSEED OIL PO) Take 1 tablet by mouth at bedtime.     . Multiple Vitamins-Minerals (MULTIVITAMIN WITH MINERALS) tablet Take 1 tablet by mouth at bedtime.     . ondansetron (ZOFRAN ODT) 4 MG disintegrating tablet Take 1 tablet (4 mg total) by mouth every 8 (eight) hours as needed for nausea. 10 tablet 0  . promethazine (PHENERGAN) 25 MG suppository Place 1 suppository (25 mg total) rectally every 6 (six) hours as needed for nausea or vomiting. 12 each 0  . promethazine (PHENERGAN) 25 MG tablet Take 1 tablet (25 mg total) by mouth every 6 (six) hours as needed for nausea or vomiting. 12 tablet 0  . RABEprazole (ACIPHEX) 20 MG tablet Take 1 tablet by mouth at bedtime.     . simvastatin (ZOCOR) 10 MG tablet Take 10 mg by mouth at bedtime.    [name] zolpidem (AMBIEN) 10 MG tablet      No current facility-administered medications for this visit.  Past Surgical History  Procedure Laterality Date  . Iliac artery stent Left 12/2007  . Cardiac valve surgery  1968  . Cholecystectomy    . Cervical fusion    . Rotator cuff repair Bilateral   . Abdominal aortagram  06/24/12  . Abdominal aortagram N/A 06/24/2012    Procedure: ABDOMINAL Maxcine Ham; Surgeon: Angelia Mould, MD; Location: [facility] CATH LAB; Service: Cardiovascular; Laterality: N/A;     Allergies  Allergen Reactions  . Penicillins Hives    Have taken Keflex before without any adverse reaction.   . Sulfa Antibiotics     Unknown  reaction      Family History  Problem Relation Age of Onset  . Hypertension Mother   . Hypertension Father   . Heart disease Father     before age 21  . Other Father     varicose veins     Social History [name] reports that she quit smoking about 5 years ago. She has never used smokeless tobacco. [name] reports that she drinks about 2.4 oz of alcohol per week.   Review of Systems CONSTITUTIONAL: No weight loss, fever, chills, weakness or fatigue.  HEENT: Eyes: No visual loss, blurred vision, double vision or yellow sclerae.No hearing loss, sneezing, congestion, runny nose or sore throat.  SKIN: No rash or itching.  CARDIOVASCULAR: per HPI RESPIRATORY: No shortness of breath, cough or sputum.  GASTROINTESTINAL: No anorexia, nausea, vomiting or diarrhea. No abdominal pain or blood.  GENITOURINARY: No burning on urination, no polyuria NEUROLOGICAL: No headache, dizziness, syncope, paralysis, ataxia, numbness or tingling in the extremities. No change in bowel or bladder control.  MUSCULOSKELETAL: No muscle, back pain, joint pain or stiffness.  LYMPHATICS: No enlarged nodes. No history of splenectomy.  PSYCHIATRIC: No history of depression or anxiety.  ENDOCRINOLOGIC: No reports of sweating, cold or heat intolerance. No polyuria or polydipsia.  [name]   Physical Examination p 82 bp 101/69 Wt 201 lbs BMI 32 Gen: resting comfortably, no acute distress HEENT: no scleral icterus, pupils equal round and reactive, no palptable cervical adenopathy,  CV: RRR, 3/6 systolic murmur RUSB, no JVD, no carotid bruits Resp: Clear to auscultation bilaterally GI: abdomen is soft, non-tender, non-distended, normal bowel sounds, no hepatosplenomegaly MSK: extremities are warm, no edema.  Skin: warm, no rash Neuro: no focal deficits Psych: appropriate affect     Assessment and Plan   1. Aortic stenosis - she reports history of supravalvular AS  with corrective surgery at age 34 - loud systolic murmur, will obtain echo.  2. HTN - at goal, continue current meds  3. PAD - continue to follow with vascular  4. Chest pain/palpitations - symptoms consistent with symptomatic palpitations, typically occur at work and brought on by stress. Fairly high caffeine intake - discussed home monitor, she is in favor of cutting back caffeine and following symptoms at this time, is persist then consider monitor at that time.   F/u 1 month  Arnoldo Lenis, M.D.

## 2014-07-21 ENCOUNTER — Encounter (HOSPITAL_COMMUNITY): Payer: Self-pay | Admitting: Cardiology

## 2014-07-22 ENCOUNTER — Encounter: Payer: Self-pay | Admitting: *Deleted

## 2014-07-22 ENCOUNTER — Telehealth: Payer: Self-pay | Admitting: *Deleted

## 2014-07-22 DIAGNOSIS — Z01811 Encounter for preprocedural respiratory examination: Secondary | ICD-10-CM

## 2014-07-22 NOTE — Telephone Encounter (Signed)
Pt made aware, scheduled heart cath for 08/24/14. Will forward to Dr. Melina Copa

## 2014-07-22 NOTE — Telephone Encounter (Signed)
-----   Message from Arnoldo Lenis, MD sent at 07/20/2014  1:11 PM EDT ----- Please set patient up for 1. Left heart cath with coronary angioraphy 2. Aortogram during left heart cath 3. Right heart cath  Diagnosis is aortic stenosis. Nikki I cc'd you as well, I know you had been working on some issues we ran into with this patient.   [name] MD

## 2014-07-22 NOTE — Telephone Encounter (Signed)
-----   Message from Arnoldo Lenis, MD sent at 07/20/2014  2:25 PM EDT ----- Please let patient know that TEE reviewed in detail. Measurements and calculations support aortic stenosis as we suspected from the initial data when we talked earlier today. We will work on arranging her heart cath  [name] MD

## 2014-07-22 NOTE — Telephone Encounter (Signed)
Per pt and ok'd by Dr. Harl Bowie pt scheduled for 08/24/14 with Dr. Ellyn Hack 1st case at 7:30am pt will arrive at 5:30 am. Will f/u with pt with instructions and date/time. Will forward to Dr. Harl Bowie as Juluis Rainier

## 2014-07-29 ENCOUNTER — Encounter: Payer: Self-pay | Admitting: *Deleted

## 2014-07-29 ENCOUNTER — Telehealth: Payer: Self-pay | Admitting: Cardiology

## 2014-07-29 ENCOUNTER — Telehealth: Payer: Self-pay | Admitting: *Deleted

## 2014-07-29 NOTE — Telephone Encounter (Signed)
Checking percert verification for heart cath set for 07-31-14  Patient has 2 BCBS

## 2014-07-29 NOTE — Telephone Encounter (Signed)
Pt calls to has left work due to CP is at Dr. Marisa Hua office now. With symptoms I rescheduled Cath for this Friday 07/31/14 with Dr. Angelena Form 7:30 AM. Pt made aware that with current symptoms she did not need to wait until May 2nd. Pt only wanted Dr. Angelena Form to perform cath. Will forward to Dr. Harl Bowie as Juluis Rainier. Pt agreed with plan and will call back after she leaves Dr. Rosanne Ashing office with update. FMLA notes printed to be sent by appropriate staff.

## 2014-07-30 ENCOUNTER — Other Ambulatory Visit: Payer: Self-pay | Admitting: Cardiology

## 2014-07-30 DIAGNOSIS — I350 Nonrheumatic aortic (valve) stenosis: Secondary | ICD-10-CM

## 2014-07-30 NOTE — Telephone Encounter (Signed)
Pt has 2 [employer] of [place].  No precert required.

## 2014-07-31 ENCOUNTER — Encounter (HOSPITAL_COMMUNITY): Payer: Self-pay | Admitting: Cardiovascular Disease

## 2014-07-31 ENCOUNTER — Encounter (HOSPITAL_COMMUNITY)
Admission: RE | Disposition: A | Discharge status: Home / Self Care | Payer: Self-pay | Source: Ambulatory Visit | Attending: Cardiology

## 2014-07-31 ENCOUNTER — Telehealth: Payer: Self-pay | Admitting: Cardiology

## 2014-07-31 ENCOUNTER — Ambulatory Visit (HOSPITAL_COMMUNITY)
Admission: RE | Admit: 2014-07-31 | Discharge: 2014-07-31 | Disposition: A | Discharge status: Home / Self Care | Payer: BLUE CROSS/BLUE SHIELD | Source: Ambulatory Visit | Attending: Cardiovascular Disease | Admitting: Cardiovascular Disease

## 2014-07-31 DIAGNOSIS — Z9049 Acquired absence of other specified parts of digestive tract: Secondary | ICD-10-CM | POA: Diagnosis not present

## 2014-07-31 DIAGNOSIS — I2510 Atherosclerotic heart disease of native coronary artery without angina pectoris: Secondary | ICD-10-CM | POA: Diagnosis not present

## 2014-07-31 DIAGNOSIS — K219 Gastro-esophageal reflux disease without esophagitis: Secondary | ICD-10-CM | POA: Insufficient documentation

## 2014-07-31 DIAGNOSIS — I739 Peripheral vascular disease, unspecified: Secondary | ICD-10-CM | POA: Insufficient documentation

## 2014-07-31 DIAGNOSIS — Z7982 Long term (current) use of aspirin: Secondary | ICD-10-CM | POA: Insufficient documentation

## 2014-07-31 DIAGNOSIS — Z87891 Personal history of nicotine dependence: Secondary | ICD-10-CM | POA: Insufficient documentation

## 2014-07-31 DIAGNOSIS — Z79899 Other long term (current) drug therapy: Secondary | ICD-10-CM | POA: Insufficient documentation

## 2014-07-31 DIAGNOSIS — I10 Essential (primary) hypertension: Secondary | ICD-10-CM | POA: Diagnosis not present

## 2014-07-31 DIAGNOSIS — E785 Hyperlipidemia, unspecified: Secondary | ICD-10-CM | POA: Insufficient documentation

## 2014-07-31 DIAGNOSIS — Z86718 Personal history of other venous thrombosis and embolism: Secondary | ICD-10-CM | POA: Diagnosis not present

## 2014-07-31 DIAGNOSIS — I350 Nonrheumatic aortic (valve) stenosis: Secondary | ICD-10-CM | POA: Insufficient documentation

## 2014-07-31 DIAGNOSIS — Z95820 Peripheral vascular angioplasty status with implants and grafts: Secondary | ICD-10-CM | POA: Insufficient documentation

## 2014-07-31 HISTORY — PX: LEFT AND RIGHT HEART CATHETERIZATION WITH CORONARY ANGIOGRAM: SHX5449

## 2014-07-31 LAB — POCT I-STAT 3, ART BLOOD GAS (G3+)
ACID-BASE DEFICIT: 3 mmol/L — AB (ref 0.0–2.0)
Bicarbonate: 21.5 mEq/L (ref 20.0–24.0)
O2 SAT: 96 %
PO2 ART: 80 mmHg (ref 80.0–100.0)
TCO2: 23 mmol/L (ref 0–100)
pCO2 arterial: 34.6 mmHg — ABNORMAL LOW (ref 35.0–45.0)
pH, Arterial: 7.402 (ref 7.350–7.450)

## 2014-07-31 LAB — BASIC METABOLIC PANEL
ANION GAP: 8 (ref 5–15)
BUN: 15 mg/dL (ref 6–23)
CALCIUM: 10.7 mg/dL — AB (ref 8.4–10.5)
CO2: 28 mmol/L (ref 19–32)
CREATININE: 1.12 mg/dL — AB (ref 0.50–1.10)
Chloride: 105 mmol/L (ref 96–112)
GFR calc Af Amer: 62 mL/min — ABNORMAL LOW (ref >90)
GFR calc non Af Amer: 53 mL/min — ABNORMAL LOW (ref >90)
GLUCOSE: 90 mg/dL (ref 70–99)
Potassium: 3.8 mmol/L (ref 3.5–5.1)
Sodium: 141 mmol/L (ref 135–145)

## 2014-07-31 LAB — PROTIME-INR
INR: 1.08 (ref 0.00–1.49)
Prothrombin Time: 14.1 seconds (ref 11.6–15.2)

## 2014-07-31 LAB — CBC
HCT: 40.1 % (ref 36.0–46.0)
Hemoglobin: 13.7 g/dL (ref 12.0–15.0)
MCH: 30.4 pg (ref 26.0–34.0)
MCHC: 34.2 g/dL (ref 30.0–36.0)
MCV: 88.9 fL (ref 78.0–100.0)
PLATELETS: 269 10*3/uL (ref 150–400)
RBC: 4.51 MIL/uL (ref 3.87–5.11)
RDW: 12.9 % (ref 11.5–15.5)
WBC: 8.5 10*3/uL (ref 4.0–10.5)

## 2014-07-31 LAB — POCT I-STAT 3, VENOUS BLOOD GAS (G3P V)
Acid-base deficit: 3 mmol/L — ABNORMAL HIGH (ref 0.0–2.0)
BICARBONATE: 22.4 meq/L (ref 20.0–24.0)
O2 SAT: 66 %
PO2 VEN: 36 mmHg (ref 30.0–45.0)
TCO2: 24 mmol/L (ref 0–100)
pCO2, Ven: 40.9 mmHg — ABNORMAL LOW (ref 45.0–50.0)
pH, Ven: 7.347 — ABNORMAL HIGH (ref 7.250–7.300)

## 2014-07-31 SURGERY — LEFT AND RIGHT HEART CATHETERIZATION WITH CORONARY ANGIOGRAM
Anesthesia: LOCAL

## 2014-07-31 MED ORDER — SODIUM CHLORIDE 0.9 % IV SOLN: INTRAVENOUS | Status: AC

## 2014-07-31 MED ORDER — ASPIRIN 81 MG PO CHEW
CHEWABLE_TABLET | ORAL | Status: AC
  Filled 2014-07-31: qty 1

## 2014-07-31 MED ORDER — NITROGLYCERIN 1 MG/10 ML FOR IR/CATH LAB
INTRA_ARTERIAL | Status: AC
  Filled 2014-07-31: qty 10

## 2014-07-31 MED ORDER — SODIUM CHLORIDE 0.9 % IJ SOLN: 3.0000 mL | INTRAMUSCULAR | Status: DC | PRN

## 2014-07-31 MED ORDER — HEPARIN SODIUM (PORCINE) 1000 UNIT/ML IJ SOLN
INTRAMUSCULAR | Status: AC
  Filled 2014-07-31: qty 1

## 2014-07-31 MED ORDER — SODIUM CHLORIDE 0.9 % IJ SOLN: 3.0000 mL | Freq: Two times a day (BID) | INTRAMUSCULAR | Status: DC

## 2014-07-31 MED ORDER — MIDAZOLAM HCL 2 MG/2ML IJ SOLN
INTRAMUSCULAR | Status: AC
Start: 2014-07-31 — End: 2014-07-31
  Filled 2014-07-31: qty 2

## 2014-07-31 MED ORDER — ASPIRIN 81 MG PO CHEW
81.0000 mg | CHEWABLE_TABLET | ORAL | Status: AC
  Administered 2014-07-31: 81 mg via ORAL

## 2014-07-31 MED ORDER — SODIUM CHLORIDE 0.9 % IV SOLN
INTRAVENOUS | Status: DC
  Administered 2014-07-31: 06:00:00 via INTRAVENOUS

## 2014-07-31 MED ORDER — SODIUM CHLORIDE 0.9 % IV SOLN: 250.0000 mL | INTRAVENOUS | Status: DC | PRN

## 2014-07-31 MED ORDER — FENTANYL CITRATE 0.05 MG/ML IJ SOLN
INTRAMUSCULAR | Status: AC
  Filled 2014-07-31: qty 2

## 2014-07-31 MED ORDER — MIDAZOLAM HCL 2 MG/2ML IJ SOLN
INTRAMUSCULAR | Status: AC
  Filled 2014-07-31: qty 2

## 2014-07-31 MED ORDER — LIDOCAINE HCL (PF) 1 % IJ SOLN
INTRAMUSCULAR | Status: AC
  Filled 2014-07-31: qty 30

## 2014-07-31 MED ORDER — HEPARIN (PORCINE) IN NACL 2-0.9 UNIT/ML-% IJ SOLN
INTRAMUSCULAR | Status: AC
  Filled 2014-07-31: qty 1000

## 2014-07-31 NOTE — Progress Notes (Signed)
Site area: Environmental health practitioner Prior to Removal:  Level 0 Pressure Applied For: 15 minutes Manual:  yes  Patient Status During Pull:  stable Post Pull Site:  Level  0 Post Pull Instructions Given:  yes Post Pull Pulses Present: yes Dressing Applied:  tegadem Bedrest begins @ 0930 Comments:  0 complications

## 2014-07-31 NOTE — CV Procedure (Signed)
Cardiac Catheterization Operative Report  Michelle Patton [id number] 4/8/20168:54 AM Octavio Graves, DO  Procedure Performed:  1. Left Heart Catheterization 2. Selective Coronary Angiography 3. Right Heart Catheterization 4. Aortic root angiogram  Operator: Lauree Chandler, MD  Indication:  59 yo female with history of supravalvular aortic stenosis as a child with repair at age 45, HTN and now with chest pain. She has been seen by Dr. Harl Bowie and had a TEE suggesting severe valvular AS. She is referred for right and left heart cath today.                             Procedure Details: The risks, benefits, complications, treatment options, and expected outcomes were discussed with the patient. The patient and/or family concurred with the proposed plan, giving informed consent. The patient was brought to the cath lab after IV hydration was begun and oral premedication was given. The patient was further sedated with Versed and Fentanyl. The right antecubital area had an IV present. This area was prepped and draped. A 5 French sheath was placed in the right antecubital vein. A balloon tipped catheter was used to perform a right heart catheterization. The right wrist was prepped and draped in a sterile fashion. 1% lidocaine used for local anesthesia. I then placed a 5 French sheath in the right radial artery. The left main was engaged with a JL3.5 catheter. The RCA was difficult to engage. I was able to get an image of the RCA with the [facility] catheter. A pigtail catheter was used to perform a left ventricular angiogram and an aortic root angiogram. There were no immediate complications. The patient was taken to the recovery area in stable condition.   Hemodynamic Findings: Ao:   107/55             LV: 190/12/15 RA:  1         RV:  41/2/6 PA:   26/6 (mean 15)     PCWP:  7 Fick Cardiac Output: 4.76 L/min Fick Cardiac Index: 2.38 L/min/m2 Central Aortic Saturation: 96% Pulmonary Artery  Saturation: 66%  Aortic valve data:  Peak to peak gradient 83 mm Hg Mean gradient 56 mmHg AVA 0.52 cm2  Angiographic Findings:  Left main: Normal caliber vessel with no obstructive disease.   Left Anterior Descending Artery: Large caliber vessel that courses to the apex. Moderate caliber diagonal branch. No obstructive disease.   Circumflex Artery: Large caliber vessel with moderate caliber obtuse marginal branch. No obstructive disease.   Right Coronary Artery: Large dominant vessel with no obstructive disease.   Left Ventricular Angiogram: LVEF=60%  Aortic root angiogram: The aortic root is not enlarged. There is supravalvular density.   Aortic valve calcification noted on plain fluoroscopy.   Impression: 1. No angiographic evidence of CAD 2. Normal LV systolic function 3. Severe gradient across the aortic valve into the aorta with density noted in the aortic root. Cannot exclude recurrent supravalvular stenosis along with aortic valve stenosis. 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  4. Normal filling pressures  Recommendations: I have reviewed the case in detail this am with my imaging partner. The TEE is also reviewed. It is difficult to tell if this is primarily aortic valve stenosis or if there is also involvement of the aortic root which is also clearly abnormal post repair as a child. Will need cardiac MRI/MRA of the thoracic aorta  to better define.  Will allow her to go home today after bedrest.        Complications:  None; patient tolerated the procedure well.

## 2014-07-31 NOTE — Telephone Encounter (Signed)
Just got home from having her cath done.. Was told to contact our office about having another test/procedure done.  Said that Dr that did her cath would sending report and orders for it.  Please contact patient

## 2014-07-31 NOTE — Interval H&P Note (Signed)
History and Physical Interval Note:  07/31/2014 7:41 AM  Michelle Patton  has presented today for cardiac cath with the diagnosis of severe aortic stenosis.  The various methods of treatment have been discussed with the patient and family. After consideration of risks, benefits and other options for treatment, the patient has consented to  Procedure(s): LEFT AND RIGHT HEART CATHETERIZATION WITH CORONARY ANGIOGRAM (N/A) as a surgical intervention .  The patient's history has been reviewed, patient examined, no change in status, stable for surgery.  I have reviewed the patient's chart and labs.  Questions were answered to the patient's satisfaction.    No PCI will be performed today.    MCALHANY,CHRISTOPHER

## 2014-07-31 NOTE — Discharge Instructions (Signed)
Radial Site Care °Refer to this sheet in the next few weeks. These instructions provide you with information on caring for yourself after your procedure. Your caregiver may also give you more specific instructions. Your treatment has been planned according to current medical practices, but problems sometimes occur. Call your caregiver if you have any problems or questions after your procedure. °HOME CARE INSTRUCTIONS °· You may shower the day after the procedure. Remove the bandage (dressing) and gently wash the site with plain soap and water. Gently pat the site dry. °· Do not apply powder or lotion to the site. °· Do not submerge the affected site in water for 3 to 5 days. °· Inspect the site at least twice daily. °· Do not flex or bend the affected arm for 24 hours. °· No lifting over 5 pounds (2.3 kg) for 5 days after your procedure. °· Do not drive home if you are discharged the same day of the procedure. Have someone else drive you. °· You may drive 24 hours after the procedure unless otherwise instructed by your caregiver. °· Do not operate machinery or power tools for 24 hours. °· A responsible adult should be with you for the first 24 hours after you arrive home. °What to expect: °· Any bruising will usually fade within 1 to 2 weeks. °· Blood that collects in the tissue (hematoma) may be painful to the touch. It should usually decrease in size and tenderness within 1 to 2 weeks. °SEEK IMMEDIATE MEDICAL CARE IF: °· You have unusual pain at the radial site. °· You have redness, warmth, swelling, or pain at the radial site. °· You have drainage (other than a small amount of blood on the dressing). °· You have chills. °· You have a fever or persistent symptoms for more than 72 hours. °· You have a fever and your symptoms suddenly get worse. °· Your arm becomes pale, cool, tingly, or numb. °· You have heavy bleeding from the site. Hold pressure on the site. °Document Released: 05/13/2010 Document Revised:  07/03/2011 Document Reviewed: 05/13/2010 °ExitCare® Patient Information ©2015 [employer]. This information is not intended to replace advice given to you by your health care provider. Make sure you discuss any questions you have with your health care provider. ° °

## 2014-07-31 NOTE — H&P (View-Only) (Signed)
Procedure H&P  Please see recent clinic note below for full medical history. 59 yo female hx of supravalvular aortic stenosis s/p prior surgery with recent TTE showing high gradient across the aortic valve. Unclear from TTE images if gradient is valvular or supravalvular. Will plan for TEE today.  [name] MD    Clinical Summary [name] is a 59 y.o.female seen today as a new patient for the following medical problems  1. Aortic stenosis - she reports hx in 1968 at age of 59 of supravavlular aortic stenosis, corrective surgery with patching at that time.  - denies any chest pain, no syncope, no presyncope - last cardio visit 10 years ago.   2. Elevated bp - elevated 158/93 during health fair at work.  - notes increased stress primarily at work.  - benazepril increased to 40mg  daily by pcp  3. Chest pain  - occurs daily. Can occur at rest or activity. Sharp pain midchest, 2-3/10. + headaches, throbbing feeling in chest. Last few minutes. Can feel a thumping feeling. Can feel SOB. No positonal. Tends to occur at work, often triggered by stress. - drinks coffee 2-3 cups, drinks tea/green tea daily (stopped), no sodas, no energy, red wine nightly. - can have some DOE at times.   4. PAD - followed by vascular - hx of left external iliac stent    Past Medical History  Diagnosis Date  . Hypertension   . Hyperlipidemia   . GERD (gastroesophageal reflux disease)   . Supravalvular aortic stenosis   . DVT (deep venous thrombosis)   . Insomnia, unspecified   . Muscle weakness (generalized)   . CAD (coronary artery disease)   . Peripheral artery disease      Allergies  Allergen Reactions  . Penicillins Hives    Have taken Keflex before without any adverse reaction.   . Sulfa Antibiotics     Unknown reaction     Current Outpatient Prescriptions  Medication Sig Dispense Refill  . aspirin 81 MG tablet Take 81 mg  by mouth at bedtime.     . benazepril (LOTENSIN) 20 MG tablet Take 40 mg by mouth at bedtime.     . calcium-vitamin D (OSCAL WITH D) 500-200 MG-UNIT per tablet Take 1 tablet by mouth at bedtime.    . clonazePAM (KLONOPIN) 0.5 MG tablet Take 1 tablet (0.5 mg total) by mouth at bedtime. 30 tablet 5  . cyclobenzaprine (FLEXERIL) 10 MG tablet Take 10 mg by mouth daily.    . Flaxseed, Linseed, (FLAXSEED OIL PO) Take 1 tablet by mouth at bedtime.     . Multiple Vitamins-Minerals (MULTIVITAMIN WITH MINERALS) tablet Take 1 tablet by mouth at bedtime.     . ondansetron (ZOFRAN ODT) 4 MG disintegrating tablet Take 1 tablet (4 mg total) by mouth every 8 (eight) hours as needed for nausea. 10 tablet 0  . promethazine (PHENERGAN) 25 MG suppository Place 1 suppository (25 mg total) rectally every 6 (six) hours as needed for nausea or vomiting. 12 each 0  . promethazine (PHENERGAN) 25 MG tablet Take 1 tablet (25 mg total) by mouth every 6 (six) hours as needed for nausea or vomiting. 12 tablet 0  . RABEprazole (ACIPHEX) 20 MG tablet Take 1 tablet by mouth at bedtime.     . simvastatin (ZOCOR) 10 MG tablet Take 10 mg by mouth at bedtime.    [name] zolpidem (AMBIEN) 10 MG tablet      No current facility-administered medications for this visit.  Past Surgical History  Procedure Laterality Date  . Iliac artery stent Left 12/2007  . Cardiac valve surgery  1968  . Cholecystectomy    . Cervical fusion    . Rotator cuff repair Bilateral   . Abdominal aortagram  06/24/12  . Abdominal aortagram N/A 06/24/2012    Procedure: ABDOMINAL Maxcine Ham; Surgeon: Angelia Mould, MD; Location: [facility] CATH LAB; Service: Cardiovascular; Laterality: N/A;     Allergies  Allergen Reactions  . Penicillins Hives    Have taken Keflex before without any adverse reaction.   . Sulfa Antibiotics     Unknown  reaction      Family History  Problem Relation Age of Onset  . Hypertension Mother   . Hypertension Father   . Heart disease Father     before age 11  . Other Father     varicose veins     Social History [name] reports that she quit smoking about 5 years ago. She has never used smokeless tobacco. [name] reports that she drinks about 2.4 oz of alcohol per week.   Review of Systems CONSTITUTIONAL: No weight loss, fever, chills, weakness or fatigue.  HEENT: Eyes: No visual loss, blurred vision, double vision or yellow sclerae.No hearing loss, sneezing, congestion, runny nose or sore throat.  SKIN: No rash or itching.  CARDIOVASCULAR: per HPI RESPIRATORY: No shortness of breath, cough or sputum.  GASTROINTESTINAL: No anorexia, nausea, vomiting or diarrhea. No abdominal pain or blood.  GENITOURINARY: No burning on urination, no polyuria NEUROLOGICAL: No headache, dizziness, syncope, paralysis, ataxia, numbness or tingling in the extremities. No change in bowel or bladder control.  MUSCULOSKELETAL: No muscle, back pain, joint pain or stiffness.  LYMPHATICS: No enlarged nodes. No history of splenectomy.  PSYCHIATRIC: No history of depression or anxiety.  ENDOCRINOLOGIC: No reports of sweating, cold or heat intolerance. No polyuria or polydipsia.  [name]   Physical Examination p 82 bp 101/69 Wt 201 lbs BMI 32 Gen: resting comfortably, no acute distress HEENT: no scleral icterus, pupils equal round and reactive, no palptable cervical adenopathy,  CV: RRR, 3/6 systolic murmur RUSB, no JVD, no carotid bruits Resp: Clear to auscultation bilaterally GI: abdomen is soft, non-tender, non-distended, normal bowel sounds, no hepatosplenomegaly MSK: extremities are warm, no edema.  Skin: warm, no rash Neuro: no focal deficits Psych: appropriate affect     Assessment and Plan   1. Aortic stenosis - she reports history of supravalvular AS  with corrective surgery at age 59 - loud systolic murmur, will obtain echo.  2. HTN - at goal, continue current meds  3. PAD - continue to follow with vascular  4. Chest pain/palpitations - symptoms consistent with symptomatic palpitations, typically occur at work and brought on by stress. Fairly high caffeine intake - discussed home monitor, she is in favor of cutting back caffeine and following symptoms at this time, is persist then consider monitor at that time.   F/u 1 month  Arnoldo Lenis, M.D.

## 2014-08-03 ENCOUNTER — Telehealth: Payer: Self-pay | Admitting: Cardiology

## 2014-08-03 NOTE — Telephone Encounter (Signed)
Per Dr. Harl Bowie, patient does not need any pre med treatments for any dental work.

## 2014-08-03 NOTE — Telephone Encounter (Signed)
Please call her cell number with the testing information.

## 2014-08-04 ENCOUNTER — Telehealth: Payer: Self-pay | Admitting: *Deleted

## 2014-08-04 DIAGNOSIS — I350 Nonrheumatic aortic (valve) stenosis: Secondary | ICD-10-CM

## 2014-08-04 NOTE — Telephone Encounter (Signed)
Orders in for Cardiac MRI and thoracic and abdominal MRA. Forwarded to [facility]. Will FYI Dr. Harl Bowie and pt when this is scheduled. Pt made aware of results and plan.

## 2014-08-04 NOTE — Telephone Encounter (Signed)
-----   Message from Arnoldo Lenis, MD sent at 07/31/2014 12:47 PM EDT ----- Please let patient know that I discussed her cath with Dr Angelena Form and one of our imaging specialists at [facility]. They have recommended a cardiac MRI as well as an MRI of the aorta. Once these studies are complete we will very likely need to refer her to a cardiac surgeon to consider fixing her valve.   Please order 1) Cardiac MRI, indication aortic stenosis 2) MRA of thoracic and abdominal aorta for aortic stenosis. Please put in comments history of supravalvular aortic stenosis, suspected bicuspid aortic valve.    [name] MD

## 2014-08-05 ENCOUNTER — Telehealth: Payer: Self-pay | Admitting: Cardiology

## 2014-08-05 NOTE — Telephone Encounter (Signed)
Called patient at her home. She will not be available until approximately one hour. Will call patient tomorrow to follow up with scheduling her test.

## 2014-08-05 NOTE — Telephone Encounter (Signed)
Received telephone call from [facility]. She will forward to scheduler. Our office will be notified as soon as we receive percerts and appointment times.

## 2014-08-05 NOTE — Telephone Encounter (Signed)
[name] needs to have Cardiac MRI , MRA Chest, MRA abdomen. Called [facility] [phone] North line to schedule Checking percert for these procedures.

## 2014-08-06 NOTE — Telephone Encounter (Signed)
Auth # [id number] exp 10/04/14 for all 3 tests (cardiac MRI, Chest MRA and MRA abdomen)  Will forward to [facility] for scheduling

## 2014-08-10 ENCOUNTER — Telehealth: Payer: Self-pay | Admitting: *Deleted

## 2014-08-10 ENCOUNTER — Encounter: Payer: Self-pay | Admitting: Cardiology

## 2014-08-10 NOTE — Telephone Encounter (Signed)
-----   Message from Chanda Busing sent at 08/10/2014  9:06 AM EDT ----- Regarding: FW: Testing Do you want to call the patient about her dates ?    ----- Message -----    From: Corinna Lines    Sent: 08/10/2014   8:59 AM      To: Chanda Busing Subject: Testing                                         Good Morning Vicky  The tests are scheduled for 08-27-14 at 8, 9 and 10 in the morning.  I have called and left a voice mail for the patient and also mailed a letter to her today.  Thanks [facility]  ----- Message -----    From: Chanda Busing    Sent: 08/10/2014   7:32 AM      To: Corinna Lines Subject: RE: TEST TO BE SCHEDULED                       [place]. Thank you so much.   ----- Message -----    From: Corinna Lines    Sent: 08/07/2014   4:05 PM      To: Chanda Busing Subject: RE: TEST TO BE SCHEDULED                        Hello Vicky  No I have not gotten a date yet will let you know on Monday hopefully.  [facility]  ----- Message -----    From: Chanda Busing    Sent: 08/07/2014   3:34 PM      To: Corinna Lines Subject: TEST TO BE SCHEDULED                           Hi [facility], Just checking to see if you by chance have a date for Mrs. Harper's Test. She called today so I am trying to do a follow up. Thank you.

## 2014-08-10 NOTE — Telephone Encounter (Signed)
Pt made aware of 08/27/14 MRI/MRA @ 8, 9, and 10 AM. Pt told she would be receiving at letter, pt will call with any questions

## 2014-08-12 ENCOUNTER — Telehealth: Payer: Self-pay | Admitting: *Deleted

## 2014-08-12 NOTE — Telephone Encounter (Signed)
Does this pt need to be seen 08/31/14? It was originally f/u from TEE.

## 2014-08-13 NOTE — Telephone Encounter (Signed)
No need to be seen 08/31/14. We are awaiting MRIs, once those are available we will refer her to CT surgery Dr Roxy Manns. She does not need to follow up with me before then  [name] MD

## 2014-08-13 NOTE — Telephone Encounter (Signed)
Pt made aware, cx appt with Dr. Harl Bowie. Will f/u after testing on 08/27/14

## 2014-08-21 ENCOUNTER — Telehealth: Payer: Self-pay | Admitting: Cardiology

## 2014-08-21 NOTE — Telephone Encounter (Addendum)
Patient called concerning her echo from February.  She states that she paid full price for her echo and doesn't understand since she has 2 blue cross coverages.  From what I can tell her [place] wasn't added until 07/29/14.  The [employer] that was filed was prior approved and [employer] [place] luckily would not need precert.  Is there a way that we can re-file this for her and she be refunded?  She is requesting a call back please.

## 2014-08-27 ENCOUNTER — Ambulatory Visit (HOSPITAL_COMMUNITY): Admission: RE | Admit: 2014-08-27 | Payer: BLUE CROSS/BLUE SHIELD | Source: Ambulatory Visit

## 2014-08-27 ENCOUNTER — Ambulatory Visit (HOSPITAL_COMMUNITY)
Admission: RE | Admit: 2014-08-27 | Discharge: 2014-08-27 | Disposition: A | Discharge status: Home / Self Care | Payer: BLUE CROSS/BLUE SHIELD | Source: Ambulatory Visit | Attending: Cardiology | Admitting: Cardiology

## 2014-08-27 DIAGNOSIS — Z48812 Encounter for surgical aftercare following surgery on the circulatory system: Secondary | ICD-10-CM | POA: Diagnosis present

## 2014-08-27 DIAGNOSIS — I350 Nonrheumatic aortic (valve) stenosis: Secondary | ICD-10-CM | POA: Diagnosis not present

## 2014-08-27 MED ORDER — GADOBENATE DIMEGLUMINE 529 MG/ML IV SOLN
30.0000 mL | Freq: Once | INTRAVENOUS | Status: AC | PRN
Start: 2014-08-27 — End: 2014-08-27

## 2014-08-27 MED ORDER — GADOBENATE DIMEGLUMINE 529 MG/ML IV SOLN
30.0000 mL | Freq: Once | INTRAVENOUS | Status: AC | PRN
  Administered 2014-08-27: 30 mL via INTRAVENOUS

## 2014-08-31 ENCOUNTER — Ambulatory Visit: Payer: BLUE CROSS/BLUE SHIELD | Admitting: Cardiology

## 2014-08-31 ENCOUNTER — Telehealth: Payer: Self-pay | Admitting: *Deleted

## 2014-08-31 DIAGNOSIS — R9389 Abnormal findings on diagnostic imaging of other specified body structures: Secondary | ICD-10-CM

## 2014-08-31 NOTE — Telephone Encounter (Signed)
Notes Recorded by Laurine Blazer, LPN on 06/28/3576 at 9:78 PM Patient notified. Referral order placed to Dr. Roxy Manns.

## 2014-08-31 NOTE — Telephone Encounter (Signed)
-----   Message from Arnoldo Lenis, MD sent at 08/28/2014 12:17 PM EDT ----- Please let patient know that MRI shows that her aortic valve and just above it are affecting the blood flow out of her heart. Please refer her to Dr Roxy Manns of cardiothoracic surgery to discuss possible surgery   Michelle Abts MD

## 2014-08-31 NOTE — Telephone Encounter (Signed)
Notes Recorded by Laurine Blazer, LPN on 07/30/8410 at 82:08 PM Left message to return call.

## 2014-09-03 ENCOUNTER — Encounter: Payer: Self-pay | Admitting: Thoracic Surgery (Cardiothoracic Vascular Surgery)

## 2014-09-03 ENCOUNTER — Telehealth: Payer: Self-pay | Admitting: Cardiology

## 2014-09-03 ENCOUNTER — Institutional Professional Consult (permissible substitution) (INDEPENDENT_AMBULATORY_CARE_PROVIDER_SITE_OTHER): Payer: BLUE CROSS/BLUE SHIELD | Admitting: Thoracic Surgery (Cardiothoracic Vascular Surgery)

## 2014-09-03 VITALS — BP 111/64 | HR 81 | Resp 16 | Ht 66.5 in | Wt 196.0 lb

## 2014-09-03 DIAGNOSIS — I351 Nonrheumatic aortic (valve) insufficiency: Secondary | ICD-10-CM | POA: Diagnosis not present

## 2014-09-03 DIAGNOSIS — Q230 Congenital stenosis of aortic valve: Secondary | ICD-10-CM

## 2014-09-03 DIAGNOSIS — I350 Nonrheumatic aortic (valve) stenosis: Secondary | ICD-10-CM | POA: Diagnosis not present

## 2014-09-03 DIAGNOSIS — E785 Hyperlipidemia, unspecified: Secondary | ICD-10-CM | POA: Insufficient documentation

## 2014-09-03 DIAGNOSIS — K219 Gastro-esophageal reflux disease without esophagitis: Secondary | ICD-10-CM | POA: Insufficient documentation

## 2014-09-03 DIAGNOSIS — I10 Essential (primary) hypertension: Secondary | ICD-10-CM | POA: Insufficient documentation

## 2014-09-03 NOTE — Patient Instructions (Signed)
Avoid all strenuous physical activity and do not donate blood or blood products until after your surgery has been done.   Endocarditis is a potentially serious infection of heart valves or inside lining of the heart.  It occurs more commonly in patients with diseased heart valves (such as patient's with aortic or mitral valve disease) and in patients who have undergone heart valve repair or replacement.  Certain surgical and dental procedures may put you at risk, such as dental cleaning, other dental procedures, or any surgery involving the respiratory, urinary, gastrointestinal tract, gallbladder or prostate gland.   To minimize your chances for develooping endocarditis, maintain good oral health and seek prompt medical attention for any infections involving the mouth, teeth, gums, skin or urinary tract.  Always notify your doctor or dentist about your underlying heart valve condition before having any invasive procedures. You will need to take antibiotics before certain procedures.

## 2014-09-03 NOTE — Telephone Encounter (Signed)
[name] called requesting to speak to [facility]. She needs to have today's visit with Dr. Roxy Manns faxed to [facility]  [phone]. She also requested to have FMLA updated to cover today's visit.

## 2014-09-03 NOTE — Progress Notes (Signed)
[address]       [place] [address]             [phone]     CARDIOTHORACIC SURGERY CONSULTATION REPORT  Referring Provider is Branch, Alphonse Guild, MD PCP is Octavio Graves, DO  Chief Complaint  Patient presents with  . 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  The patient states that she underwent open-heart surgery at age 23 for supravalvar aortic stenosis. At the time she lived in [place]. She was told that she had Teflon patch repair of her aorta and that her aortic valve did not need repair. To her recollection there was no mention of any other associated congenital heart defects including no ventricular septal defect nor coarctation of the aorta.  She was not allowed to participate in sports during childhood but she otherwise has lived a normal and relatively healthy life ever since. He has been followed periodically with serial echocardiograms, most recently in 2011. She states that she has never been told that there was any sign of developing valve related problems until recently. Over the past 6 months the patient has developed progressive symptoms of exertional shortness of breath and chest discomfort.  She underwent a follow-up transthoracic echocardiogram 06/10/2014 that revealed high velocity jet across the aortic valve measuring greater than 5 m/s suggestive of the presence of severe aortic stenosis or possible recurrence of supravalvar aortic stenosis. Left ventricular size and systolic function remained normal with ejection fraction  estimated 55-60%. The patient subsequently underwent transesophageal echocardiogram on 07/20/2014 which demonstrated the presence of severe aortic stenosis with a tricuspid aortic valve that had moderate thickening and sclerosis involving all 3 leaflets of the valve. Peak velocity across aortic valve measured 5.5 m/s corresponding to a mean transvalvular gradient of 64 mmHg. By planimetry the aortic valve area was measured 0.9 cm. The aortic root appeared normal in size and there was no definite sign of residual supravalvular obstruction, although views were somewhat limited. There was moderate aortic insufficiency.  Left and right heart catheterization was performed 07/31/2014. This confirmed the presence of severe aortic stenosis with peak-to-peak and mean transvalvular gradients measured 83 and 56 mmHg, respectively by catheterization. Aortic valve area was calculated at 0.52 cm.  Pulmonary artery pressures were normal and there was no significant coronary artery disease. The right coronary artery was never directly engaged during catheterization because of unusual angulation from the aortic root, but the vessel did not appear to have significant coronary artery disease.  A cardiac gated MRI was performed 08/27/2014 that revealed a trileaflet aortic valve that was abnormal in appearance with significant restriction to leaflet mobility. There was also concern about possible residual membrane or web in the aortic root consistent with residual supravalvular aortic stenosis.  The patient was referred for surgical consultation.  The patient is married and lives with her husband in [place]. 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 This does not require any sort of strenuous physical activity. She has been physically active and functional independent all of her life up until recently. The patient  states that she has gone down steadily over the past 6 months with progressive symptoms of  exertional shortness of breath and chest discomfort.  She states that she initially noted that she would get short of breath and fatigued with more strenuous activity such as walking at a brisk pain or going up a flight of stairs. Symptoms have progressed substantially such that the patient now gets short of breath with moderate activity, and this has caused her to have to cut back her daily activities significantly. 3 or 4 months ago she began to experience associated symptoms of exertional chest pain which she describes as a sharp achy pain that radiates across the left chest. Symptoms are always associated with stress or physical exertion and shortness of breath. Symptoms are usually relieved fairly quickly with resting, although she states that last week she had a prolonged episode of shortness of breath and chest discomfort after she had been vacuuming in her house. She has had some occasional mild dizzy spells without syncope. She denies any history of resting shortness of breath, PND, orthopnea. She had some mild lower extremity edema that has resolved since her blood pressure medication was changed recently.  Past Medical History  Diagnosis Date  . Hypertension   . Hyperlipidemia   . GERD (gastroesophageal reflux disease)   . DVT (deep venous thrombosis)   . Insomnia, unspecified   . Muscle weakness (generalized)   . Peripheral artery disease   . Aortic stenosis, severe 07/20/2014  . Aortic regurgitation 07/20/2014  . Supravalvular aortic stenosis, congenital - s/p repair during childhood     Past Surgical History  Procedure Laterality Date  . Iliac artery stent Left 12/2007  . Cardiac valve surgery  1968  . Cholecystectomy    . Cervical fusion    . Rotator cuff repair Bilateral   . Abdominal aortagram  06/24/12  . Abdominal aortagram N/A 06/24/2012    Procedure: ABDOMINAL Maxcine Ham;  Surgeon: Angelia Mould, MD;  Location: [facility] CATH LAB;  Service: Cardiovascular;  Laterality: N/A;    . Tee without cardioversion N/A 07/07/2014    Procedure: TRANSESOPHAGEAL ECHOCARDIOGRAM (TEE);  Surgeon: Arnoldo Lenis, MD;  Location: [facility] ENDO SUITE;  Service: Cardiology;  Laterality: N/A;  . Tee without cardioversion N/A 07/20/2014    Procedure: TRANSESOPHAGEAL ECHOCARDIOGRAM (TEE) WITH PROPOFOL;  Surgeon: Arnoldo Lenis, MD;  Location: [facility] ORS;  Service: Endoscopy;  Laterality: N/A;  . Left and right heart catheterization with coronary angiogram N/A 07/31/2014    Procedure: LEFT AND RIGHT HEART CATHETERIZATION WITH CORONARY ANGIOGRAM;  Surgeon: Burnell Blanks, MD;  Location: [facility] CATH LAB;  Service: Cardiovascular;  Laterality: N/A;    Family History  Problem Relation Age of Onset  . Hypertension Mother   . Hypertension Father   . Heart disease Father     before age 6  . Other Father     varicose veins    History   Social History  . Marital Status: Married    Spouse Name: N/A  . Number of Children: 0  . Years of Education: some coll   Occupational History  . call center     [employer]   Social History Main Topics  . Smoking status: Current Some Day Smoker -- 0.75 packs/day    Types: Cigarettes    Last Attempt to Quit: 05/25/2009  . Smokeless tobacco: Never Used     Comment: started back about 6 months ago   . Alcohol Use: 2.4 oz/week  4 Glasses of wine per week     Comment: 4 drinks per week  . Drug Use: No  . Sexual Activity: Not on file   Other Topics Concern  . Not on file   Social History Narrative   Patient is single and lives at home with her partner. Patient works at [employer] center. Some college education. Patient drinks coffee and tea.    Current Outpatient Prescriptions  Medication Sig Dispense Refill  . aspirin 81 MG tablet Take 81 mg by mouth at bedtime.     . calcium-vitamin D (OSCAL WITH D) 500-200 MG-UNIT per tablet Take 1 tablet by mouth at bedtime.    . clonazePAM (KLONOPIN) 0.5 MG tablet Take 1 tablet (0.5 mg total) by mouth at bedtime.  30 tablet 5  . cyclobenzaprine (FLEXERIL) 10 MG tablet Take 10 mg by mouth as needed for muscle spasms.     [name] estradiol (ESTRACE) 1 MG tablet Take 1 mg by mouth at bedtime.    . Flaxseed, Linseed, (FLAXSEED OIL PO) Take 1 tablet by mouth at bedtime.     [name] ibuprofen (ADVIL,MOTRIN) 200 MG tablet Take 400 mg by mouth every 6 (six) hours as needed (pain).    [name] lisinopril (PRINIVIL,ZESTRIL) 20 MG tablet Take 20 mg by mouth daily.    . medroxyPROGESTERone (PROVERA) 2.5 MG tablet Take 2.5 mg by mouth at bedtime.    . Multiple Vitamins-Minerals (MULTIVITAMIN WITH MINERALS) tablet Take 1 tablet by mouth at bedtime.     . RABEprazole (ACIPHEX) 20 MG tablet Take 1 tablet by mouth at bedtime.     [name] zolpidem (AMBIEN) 10 MG tablet Take 10 mg by mouth as needed for sleep.      No current facility-administered medications for this visit.    Allergies  Allergen Reactions  . Penicillins Hives    Have taken Keflex before without any adverse reaction.   . Sulfa Antibiotics     Unknown reaction      Review of Systems:   General:  decreased appetite, decreased energy, no weight gain, 10 lb weight loss, no fever  Cardiac:  + chest pain with exertion, no chest pain at rest, + SOB with exertion, no resting SOB, no PND, no orthopnea, no palpitations, no arrhythmia, no atrial fibrillation, + LE edema, + dizzy spells, no syncope  Respiratory:  + shortness of breath, no home oxygen, intermittent productive cough, no dry cough, no bronchitis, no wheezing, no hemoptysis, no asthma, no pain with inspiration or cough, no sleep apnea, no CPAP at night  GI:   no difficulty swallowing, no reflux, no frequent heartburn, no hiatal hernia, no abdominal pain, no constipation, no diarrhea, no hematochezia, no hematemesis, no melena  GU:   no dysuria,  no frequency, no urinary tract infection, no hematuria, no kidney stones, no kidney disease  Vascular:  no pain suggestive of claudication, no pain in feet, + leg cramps, no  varicose veins, no DVT, no non-healing foot ulcer  Neuro:   no stroke, no TIA's, no seizures, no headaches, no temporary blindness one eye,  no slurred speech, no peripheral neuropathy, no chronic pain, no instability of gait, no memory/cognitive dysfunction  Musculoskeletal: no arthritis, no joint swelling, no myalgias, no difficulty walking, normal mobility   Skin:   no rash, no itching, no skin infections, no pressure sores or ulcerations  Psych:   no anxiety, no depression, no nervousness, + unusual recent stress  Eyes:   no blurry vision, no floaters,  no recent vision changes, + wears glasses or contacts  ENT:   no hearing loss, no loose or painful teeth, no dentures, last saw dentist last month  Hematologic:  no easy bruising, no abnormal bleeding, no clotting disorder, no frequent epistaxis  Endocrine:  no diabetes, does not check CBG's at home     Physical Exam:   BP 111/64 mmHg  Pulse 81  Resp 16  Ht 5' 6.5" (1.689 m)  Wt 196 lb (88.905 kg)  BMI 31.16 kg/m2  SpO2 98%  General:  mildy obese but o/w  well-appearing  HEENT:  Unremarkable   Neck:   no JVD, no bruits, no adenopathy   Chest:   clear to auscultation, symmetrical breath sounds, no wheezes, no rhonchi   CV:   RRR, grade IV/VI crescendo/decrescendo systolic murmur best RUSB, no diastolic murmur  Abdomen:  soft, non-tender, no masses   Extremities:  warm, well-perfused, pulses diminished but palpable, no LE edema  Rectal/GU  Deferred  Neuro:   Grossly non-focal and symmetrical throughout  Skin:   Clean and dry, no rashes, no breakdown   Diagnostic Tests:  Transthoracic Echocardiography  Patient:  Michelle Patton, Michelle Patton MR #:    [id number] Study Date: 06/10/2014 Gender:   F Age:    18 Height:   167.6 cm Weight:   91.2 kg BSA:    2.09 m^2 Pt. Status: Room:  SONOGRAPHER [facility] ATTENDING  Kerry Hough, M.D. Berna Spare, M.D. REFERRING  Kerry Hough,  M.D. PERFORMING  [facility], [place]  cc:  ------------------------------------------------------------------- LV EF: 55% -  60%  ------------------------------------------------------------------- History:  PMH:  Chest pain. Palpitations and murmur. Coronary artery disease. Aortic valve disease. Risk factors: Current tobacco use. Hypertension. Dyslipidemia.  ------------------------------------------------------------------- Study Conclusions  - Left ventricle: The cavity size was normal. Wall thickness was increased in a pattern of mild LVH. Systolic function was normal. The estimated ejection fraction was in the range of 55% to 60%. Wall motion was normal; there were no regional wall motion abnormalities. - Aortic valve: There is thickening of the aortic valve leaflets. I can not be sure of the anatomy of the aortic valve. There is mild AI. There is a very high velocity jet in the area of the valve.. I suspect that it is not from the valve itself. - Aorta: Doppler in the ascending aorta is very abnormal. There is a high velocity jet greater than 57m/sec in the ascendding aorta. There is history of supra-aortic stenosis that was treated surgically (with a patch?) at age 33 in 26. I suspect that there is now some type of recurrent severe stenosis above the valve. The valve itselt is abnormal, but I suspect the stenosis is above the valve. Patient will need TEE or CT or MRI to furhter assess this anatomy. Prior echo is not available for my review. - Mitral valve: Mildly calcified annulus. - Left atrium: The atrium was mildly dilated. - Right ventricle: The cavity size was normal. Systolic function was mildly reduced. - Right atrium: The atrium was mildly dilated.  ------------------------------------------------------------------- Labs, prior tests, procedures, and surgery: Echocardiography (February 2011).  The aortic valve showed  mild regurgitation. Aortic valve: peak gradient of 63 mm Hg.  Valve surgery (1968).   Aortic valve repair. Supravalvular aortic stenosis corrective surgery with patching. Transthoracic echocardiography. M-mode, complete 2D, spectral Doppler, and color Doppler. Birthdate: Patient birthdate: 09-May-1955. Age: Patient is 59 yr old. Sex: Gender: female. BMI: 32.4 kg/m^2. Blood pressure:   101/69 Patient status:  Outpatient. Study date: Study date: 06/10/2014. Study time: 08:09 AM.  -------------------------------------------------------------------  ------------------------------------------------------------------- Left ventricle: The cavity size was normal. Wall thickness was increased in a pattern of mild LVH. Systolic function was normal. The estimated ejection fraction was in the range of 55% to 60%. Wall motion was normal; there were no regional wall motion abnormalities.  ------------------------------------------------------------------- Aortic valve: There is thickening of the aortic valve leaflets. I can not be sure of the anatomy of the aortic valve. There is mild AI. There is a very high velocity jet in the area of the valve.. I suspect that it is not from the valve itself. Doppler:   Valve area (VTI): 0.44 cm^2. Indexed valve area (VTI): 0.21 cm^2/m^2. Valve area (Vmax): 0.5 cm^2. Indexed valve area (Vmax): 0.24 cm^2/m^2.  Peak gradient (S): 137 mm Hg.  ------------------------------------------------------------------- Aorta: Doppler in the ascending aorta is very abnormal. There is a high velocity jet greater than 62m/sec in the ascendding aorta. There is history of supra-aortic stenosis that was treated surgically (with a patch?) at age 40 in 44. I suspect that there is now some type of recurrent severe stenosis above the valve. The valve itselt is abnormal, but I suspect the stenosis is above the valve. Patient will need TEE or CT or MRI to  furhter assess this anatomy.  ------------------------------------------------------------------- Mitral valve:  Mildly calcified annulus. Doppler: There was no significant regurgitation.  Peak gradient (D): 3 mm Hg.  ------------------------------------------------------------------- Left atrium: The atrium was mildly dilated.  ------------------------------------------------------------------- Right ventricle: The cavity size was normal. Systolic function was mildly reduced.  ------------------------------------------------------------------- Pulmonic valve:  The valve appears to be grossly normal. Doppler: There was no significant regurgitation.  ------------------------------------------------------------------- Tricuspid valve:  Structurally normal valve.  Leaflet separation was normal. Doppler: Transvalvular velocity was within the normal range. There was no regurgitation.  ------------------------------------------------------------------- Right atrium: The atrium was mildly dilated.  ------------------------------------------------------------------- Pericardium: There was no pericardial effusion.  ------------------------------------------------------------------- Post procedure conclusions Ascending Aorta:  - Doppler in the ascending aorta is very abnormal. There is a high velocity jet greater than 86m/sec in the ascendding aorta. There is history of supra-aortic stenosis that was treated surgically (with a patch?) at age 7 in 57. I suspect that there is now some type of recurrent severe stenosis above the valve. The valve itselt is abnormal, but I suspect the stenosis is above the valve. Patient will need TEE or CT or MRI to furhter assess this anatomy.  ------------------------------------------------------------------- Measurements  Left ventricle              Value     Reference LV ID, ED, PLAX chordal           49  mm    43 - 52 LV ID, ES, PLAX chordal          37  mm    23 - 38 LV fx shortening, PLAX chordal  (L)   24  %    >=29 LV PW thickness, ED            16.4 mm    --------- IVS/LV PW ratio, ED            0.58      <=1.3 Stroke volume, 2D             66  ml    --------- Stroke volume/bsa, 2D           32  ml/m^2  --------- LV ejection fraction, 1-p A4C  53  %    --------- LV end-diastolic volume, 2-p       110  ml    --------- LV end-systolic volume, 2-p        50  ml    --------- LV ejection fraction, 2-p         54  %    --------- Stroke volume, 2-p            60  ml    --------- LV end-diastolic volume/bsa, 2-p     53  ml/m^2  --------- LV end-systolic volume/bsa, 2-p      24  ml/m^2  --------- Stroke volume/bsa, 2-p          28.5 ml/m^2  --------- LV e&', lateral              7.93 cm/s   --------- LV E/e&', lateral             11.73     --------- LV e&', medial               7.16 cm/s   --------- LV E/e&', medial              12.99     --------- LV e&', average              7.55 cm/s   --------- LV E/e&', average             12.33     ---------  Ventricular septum            Value     Reference IVS thickness, ED             9.56 mm    ---------  LVOT                   Value     Reference LVOT ID, S                20  mm    --------- LVOT area                 3.14 cm^2   --------- LVOT ID                  20  mm    --------- LVOT peak velocity, S           92.3 cm/s   --------- LVOT mean velocity, S            64.5 cm/s   --------- LVOT VTI, S                20.9 cm    --------- Stroke volume (SV), LVOT DP        65.7 ml    --------- Stroke index (SV/bsa), LVOT DP      31.4 ml/m^2  ---------  Aortic valve               Value     Reference Aortic peak gradient, S          137  mm Hg  --------- Aortic valve area, VTI          0.44 cm^2   --------- Aortic valve area/bsa, VTI        0.21 cm^2/m^2 --------- Aortic valve area, peak velocity     0.5  cm^2   --------- Aortic valve area/bsa, peak        0.24 cm^2/m^2 --------- velocity Aortic regurg pressure half-time  547  ms    ---------  Aorta                   Value     Reference Aortic root ID, ED            35  mm    ---------  Left atrium                Value     Reference LA ID, A-P, ES              39  mm    --------- LA ID/bsa, A-P              1.86 cm/m^2  <=2.2 LA volume, S               82.6 ml    --------- LA volume/bsa, S             39.5 ml/m^2  --------- LA volume, ES, 1-p A4C          86  ml    --------- LA volume/bsa, ES, 1-p A4C        41.1 ml/m^2  --------- LA volume, ES, 1-p A2C          74.1 ml    --------- LA volume/bsa, ES, 1-p A2C        35.4 ml/m^2  ---------  Mitral valve               Value     Reference Mitral E-wave peak velocity        93  cm/s   --------- Mitral A-wave peak velocity        90.6 cm/s   --------- Mitral deceleration time         190  ms    150 - 230 Mitral peak gradient, D          3   mm Hg  --------- Mitral E/A ratio, peak          1       ---------  Systemic veins               Value     Reference Estimated CVP               3   mm Hg  ---------  Right ventricle              Value     Reference RV s&', lateral, S             8.8  cm/s   ---------  Legend: (L) and (H) mark values outside specified reference range.  ------------------------------------------------------------------- Prepared and Electronically Authenticated by  Dola Argyle, MD 2016-02-18T11:06:39    Transesophageal Echocardiography  Patient:  Taylour, Lietzke MR #:    [id number] Study Date: 07/20/2014 Gender:   F Age:    26 Height:   167.6 cm Weight:   91.4 kg BSA:    2.1 m^2 Pt. Status: Room:    AP[facility]  ATTENDING  Kate Sable, MD ADMITTING  Kerry Hough, M.D. Berna Spare, M.D. REFERRING  Kerry Hough, M.D. PERFORMING  [facility], [place] SONOGRAPHER Titus Mould, RCS  cc:  ------------------------------------------------------------------- LV EF: 60% -  65%  ------------------------------------------------------------------- Indications:   Aortic stenosis 424.1.  ------------------------------------------------------------------- Study Conclusions  - Left ventricle: The cavity size was normal. Wall thickness was normal. Systolic function was normal. The estimated ejection fraction was in the range of 60%  to 65%. Wall motion was normal; there were no regional wall motion abnormalities. Doppler parameters are consistent with abnormal left ventricular relaxation (grade 1 diastolic dysfunction). - Aortic valve: Mildly calcified annulus. Trileaflet; moderately thickened leaflets. There appears to be partial prolapse of the aortic leaflets. There was severe stenosis. There was mild to moderate regurgitation. The AI VC is 0.3 cm. Peak velocity (S): 550.85 cm/s. Mean gradient (S): 64 mm Hg. Valve area (VTI): 0.75 cm^2. Valve  area (Vmax): 0.66 cm^2. AVA by planimetry 0.9 cm squared. - Aortic root: The aortic root was normal in size. No evidence of supravalvular obstruction from available views. - Left atrium: No evidence of thrombus in the atrial cavity or appendage. No evidence of thrombus in the appendage. - Right atrium: No evidence of thrombus in the atrial cavity or appendage. - Atrial septum: No defect or patent foramen ovale was identified.  Impressions:  - Severe aortic stenosis. Patient with history of supravalvular aortic stenosis with patch repair roughly 50 years ago. Several views of the aortic root and proximal ascending aorta taken without evidence of supravalvular obstruction, appears to be valvular stenosis.  Diagnostic transesophageal echocardiography. 2D and color Doppler. Birthdate: Patient birthdate: Dec 23, 1955. Age: Patient is 59 yr old. Sex: Gender: female.  BMI: 32.5 kg/m^2. Blood pressure: 128/86 Patient status: Outpatient. Study date: Study date: 07/20/2014. Study time: 09:15 AM. Location: PACU.  -------------------------------------------------------------------  ------------------------------------------------------------------- Left ventricle: The cavity size was normal. Wall thickness was normal. Systolic function was normal. The estimated ejection fraction was in the range of 60% to 65%. Wall motion was normal; there were no regional wall motion abnormalities. Doppler parameters are consistent with abnormal left ventricular relaxation (grade 1 diastolic dysfunction).  ------------------------------------------------------------------- Aortic valve:  Mildly calcified annulus. Trileaflet; moderately thickened leaflets. There appears to be partial prolapse of the aortic leaflets. AVA by planimetry 0.9 cm squared. Doppler: There was severe stenosis.  There was mild to moderate regurgitation. The AI VC is 0.3 cm.  VTI ratio of LVOT to  aortic valve: 0.24. Valve area (VTI): 0.75 cm^2. Indexed valve area (VTI): 0.36 cm^2/m^2. Valve area (Vmax): 0.66 cm^2. Indexed valve area (Vmax): 0.31 cm^2/m^2.  Mean gradient (S): 64 mm Hg. Peak gradient (S): 121 mm Hg.  ------------------------------------------------------------------- Aorta: Aortic root: The aortic root was normal in size. Sinotubular junction 24 mm. Proximal ascending aorta 24 mm. The visualized portions of the descending aorta are normal. No evidence of supravalvular obstruction from available views.  ------------------------------------------------------------------- Mitral valve:  Normal thickness leaflets . Doppler:  There was no evidence for stenosis.  There was no significant regurgitation.  Mean gradient (D): 1 mm Hg.  ------------------------------------------------------------------- Left atrium: The atrium was normal in size. No evidence of thrombus in the atrial cavity or appendage. No evidence of thrombus in the appendage. The appendage was of normal size. Emptying velocity was normal at 74 cm/s.  ------------------------------------------------------------------- Atrial septum: No defect or patent foramen ovale was identified.  ------------------------------------------------------------------- Right ventricle: The cavity size was normal. Wall thickness was normal. Systolic function was normal.  ------------------------------------------------------------------- Pulmonic valve:  Not well visualized. Doppler:  There was no evidence for stenosis.  There was no significant regurgitation.  ------------------------------------------------------------------- Tricuspid valve:  Normal thickness leaflets. Doppler:  There was no evidence for stenosis.  There was trivial regurgitation.  ------------------------------------------------------------------- Right atrium: There is a prominent eustachian valve noted. The atrium was  normal in size. No evidence of thrombus in the atrial cavity or appendage.  ------------------------------------------------------------------- Pericardium: There was no pericardial effusion.  -------------------------------------------------------------------  Measurements  LVOT                   Value LVOT ID, S                20   mm LVOT area                 3.14  cm^2 LVOT peak velocity, S           115.7 cm/s LVOT VTI, S                31.43 cm LVOT peak gradient, S           5   mm Hg  Aortic valve               Value Aortic valve peak velocity, S       550.85 cm/s Aortic valve mean velocity, S       373.79 cm/s Aortic valve VTI, S            131.61 cm Aortic mean gradient, S          64   mm Hg Aortic peak gradient, S          121  mm Hg VTI ratio, LVOT/AV            0.24 Aortic valve area, VTI          0.75  cm^2 Aortic valve area/bsa, VTI        0.36  cm^2/m^2 Aortic valve area, peak velocity     0.66  cm^2 Aortic valve area/bsa, peak velocity   0.31  cm^2/m^2  Aorta                   Value Aortic root ID              27   mm  Mitral valve               Value Mitral mean velocity, D          52.08 cm/s Mitral mean gradient, D          1   mm Hg  Legend: (L) and (H) mark values outside specified reference range.  ------------------------------------------------------------------- Prepared and Electronically Authenticated by  Kerry Hough, M.D. 2016-03-28T13:06:15    Cardiac Catheterization Operative Report  Lonia Mad [id number] 4/8/20168:54 AM Octavio Graves, DO  Procedure Performed:  1. Left Heart Catheterization 2. Selective Coronary  Angiography 3. Right Heart Catheterization 4. 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 She has been seen by Dr. Harl Bowie and had a TEE suggesting severe valvular AS. She is referred for right and left heart cath today.   Procedure Details: The risks, benefits, complications, treatment options, and expected outcomes were discussed with the patient. The patient and/or family concurred with the proposed plan, giving informed consent. The patient was brought to the cath lab after IV hydration was begun and oral premedication was given. The patient was further sedated with Versed and Fentanyl. The right antecubital area had an IV present. This area was prepped and draped. A 5 French sheath was placed in the right antecubital vein. A balloon tipped catheter was used to perform a right heart catheterization. The right wrist was prepped and draped in a sterile fashion. 1% lidocaine used for  local anesthesia. I then placed a 5 French sheath in the right radial artery. The left main was engaged with a JL3.5 catheter. The RCA was difficult to engage. I was able to get an image of the RCA with the [facility] catheter. A pigtail catheter was used to perform a left ventricular angiogram and an aortic root angiogram. There were no immediate complications. The patient was taken to the recovery area in stable condition.   Hemodynamic Findings: Ao: 107/55  LV: 190/12/15 RA: 1  RV: 41/2/6 PA: 26/6 (mean 15)  PCWP: 7 Fick Cardiac Output: 4.76 L/min Fick Cardiac Index: 2.38 L/min/m2 Central Aortic Saturation: 96% Pulmonary Artery Saturation: 66%  Aortic valve data:  Peak to peak gradient 83 mm Hg Mean gradient 56 mmHg AVA 0.52 cm2  Angiographic Findings:  Left main: Normal caliber vessel with no obstructive  disease.   Left Anterior Descending Artery: Large caliber vessel that courses to the apex. Moderate caliber diagonal branch. No obstructive disease.   Circumflex Artery: Large caliber vessel with moderate caliber obtuse marginal branch. No obstructive disease.   Right Coronary Artery: Large dominant vessel with no obstructive disease.   Left Ventricular Angiogram: LVEF=60%  Aortic root angiogram: The aortic root is not enlarged. There is supravalvular density.   Aortic valve calcification noted on plain fluoroscopy.   Impression: 1. No angiographic evidence of CAD 2. Normal LV systolic function 3. Severe gradient across the aortic valve into the aorta with density noted in the aortic root. Cannot exclude recurrent supravalvular stenosis along with aortic valve stenosis. 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  4. Normal filling pressures  Recommendations: I have reviewed the case in detail this am with my imaging partner. The TEE is also reviewed. It is difficult to tell if this is primarily aortic valve stenosis or if there is also involvement of the aortic root which is also clearly abnormal post repair as a child. Will need cardiac MRI/MRA of the thoracic aorta to better define.  Will allow her to go home today after bedrest.    Complications: None; patient tolerated the procedure well.       CARDIAC MRI  TECHNIQUE: The patient was scanned on a 1.5 Tesla GE magnet. A dedicated cardiac coil was used. Functional imaging was done using Fiesta sequences. 2,3, and 4 chamber views were done to assess for RWMA's. Modified Simpson's rule using a short axis stack was used to calculate an ejection fraction on a dedicated work Conservation officer, nature. The patient received 30 cc of Multihance. MR angiography was done. After 10 minutes inversion recovery sequences were used to assess for infiltration and scar  tissue.  CONTRAST: 30 cc Multihance  FINDINGS: No gross abnormalities on limited images of the lung fields.  The left ventricle was normal in size with mild to moderate LV hypertrophy (concentric). EF 59% with normal wall motion. Normal right ventricular size and systolic function. Normal atrial sizes. No significant mitral regurgitation. The aortic valve was trileaflet but misshapen and thickened. One leaflet appears to have some prolapse. There was restriction to aortic valve opening with a degree of stenosis. Just superior to the aortic valve, I am concerned about possible residual membrane or web creating additional obstruction. This is difficult to visualize.  On delayed enhancement imaging, there was no myocardial late gadolinium enhancement.  On MR angiography, the thoracic aorta was normal in caliber with normal arch vessel origins. The pulmonary veins drain normally to the left atrium.  MEASUREMENTS: LV EDV 185 cc  LV SV 110 cc  LV EF 59%  IMPRESSION: 1. Normal LV size with mild to moderate LV hypertrophy, EF 59%.  2. Normal RV size and systolic function.  3. Misshapen and thickened but trileaflet aortic valve. One leaflet has some degree of prolapse. There was restriction to aortic valve opening with a degree of stenosis. Just superior to the aortic valve, I am concerned about possible residual membrane or web creating additional obstruction. This is difficult to visualize.  Dalton Mclean   Electronically Signed  By: Loralie Champagne M.D.  On: 08/27/2014 19:26    Impression:  Patient has stage D severe symptomatic aortic stenosis with normal left ventricular systolic function. She reportedly underwent patch repair of her aortic root for supravalvular aortic stenosis at age 21.  I have personally reviewed the patient's recent echocardiograms, catheterization, and cardiac MRI. The patient's aortic valve is tricuspid but all 3 leaflets are  abnormal with significant thickening and restricted leaflet mobility. There is mild prolapse of one of the leaflets. There is severe aortic stenosis with peak velocity across the aortic valve measured greater than 5 m/s. There is mild to moderate aortic insufficiency. There may be some degree of residual supravalvular aortic stenosis with a partial residual membrane that can be visualized on some images of the patient's cardiac MRI. 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 I agree that the patient needs aortic valve replacement, and under the circumstances she probably will need aortic root replacement.   Plan:  The patient and her husband were counseled at length regarding treatment alternatives for management of severe aortic stenosis including continued medical therapy versus proceeding with aortic valve replacement in the near future.  The natural history of aortic stenosis was reviewed, as was long term prognosis with medical therapy alone.  Surgical options were discussed at length including conventional surgical aortic valve replacement using either a mechanical prosthesis or a bioprosthetic tissue valve through either a conventional median sternotomy or using minimally invasive techniques.  Other alternatives including the Ross autograft procedure, homograft aortic root replacement, stentless bioprosthetic tissue valve replacement, valve repair, and transcatheter aortic valve replacement were discussed.  Discussion was held comparing the relative risks of mechanical valve replacement with need for lifelong anticoagulation versus use of a bioprosthetic tissue valve and the associated potential for late structural valve deterioration in failure.  The implications regarding the patient's  previous surgery in the distant past and the likelihood that aortic root replacement will be necessary at the time of surgery has been explained in detail.  I feel that cardiac gated CT angiogram might be helpful to further characterize the anatomy of the aortic root, with particular reference to the origin of the coronary arteries.  We plan to proceed with cardiac gated CT angiogram as soon as practical. The patient is contemplating going to [facility] for a second opinion. She will call and schedule a follow-up appointment with [place] in the future as desired. I have suggested that the patient should avoid strenuous physical exertion of any type and be careful not to allow herself to get dehydrated as the hot weather of summer approaches.  All of their questions been addressed.    I spent in excess of 90 minutes during the conduct of this office consultation  and >50% of this time involved direct face-to-face encounter with the patient for counseling and/or coordination of their care.   Valentina Gu. Roxy Manns, MD 09/03/2014 12:04 PM

## 2014-09-07 ENCOUNTER — Other Ambulatory Visit: Payer: Self-pay | Admitting: *Deleted

## 2014-09-07 DIAGNOSIS — I350 Nonrheumatic aortic (valve) stenosis: Secondary | ICD-10-CM

## 2014-09-09 ENCOUNTER — Emergency Department (HOSPITAL_COMMUNITY): Payer: BLUE CROSS/BLUE SHIELD

## 2014-09-09 ENCOUNTER — Telehealth: Payer: Self-pay | Admitting: Cardiology

## 2014-09-09 ENCOUNTER — Emergency Department (HOSPITAL_COMMUNITY)
Admission: EM | Admit: 2014-09-09 | Discharge: 2014-09-09 | Disposition: A | Discharge status: Home / Self Care | Payer: BLUE CROSS/BLUE SHIELD | Attending: Emergency Medicine | Admitting: Emergency Medicine

## 2014-09-09 ENCOUNTER — Encounter (HOSPITAL_COMMUNITY): Payer: Self-pay | Admitting: Family Medicine

## 2014-09-09 DIAGNOSIS — R011 Cardiac murmur, unspecified: Secondary | ICD-10-CM | POA: Diagnosis not present

## 2014-09-09 DIAGNOSIS — R0602 Shortness of breath: Secondary | ICD-10-CM | POA: Diagnosis not present

## 2014-09-09 DIAGNOSIS — K219 Gastro-esophageal reflux disease without esophagitis: Secondary | ICD-10-CM | POA: Diagnosis present

## 2014-09-09 DIAGNOSIS — Z880 Allergy status to penicillin: Secondary | ICD-10-CM | POA: Insufficient documentation

## 2014-09-09 DIAGNOSIS — R079 Chest pain, unspecified: Secondary | ICD-10-CM | POA: Insufficient documentation

## 2014-09-09 DIAGNOSIS — Z79899 Other long term (current) drug therapy: Secondary | ICD-10-CM | POA: Insufficient documentation

## 2014-09-09 DIAGNOSIS — Z8774 Personal history of (corrected) congenital malformations of heart and circulatory system: Secondary | ICD-10-CM | POA: Diagnosis not present

## 2014-09-09 DIAGNOSIS — I350 Nonrheumatic aortic (valve) stenosis: Secondary | ICD-10-CM

## 2014-09-09 DIAGNOSIS — R42 Dizziness and giddiness: Secondary | ICD-10-CM | POA: Insufficient documentation

## 2014-09-09 DIAGNOSIS — G4700 Insomnia, unspecified: Secondary | ICD-10-CM | POA: Insufficient documentation

## 2014-09-09 DIAGNOSIS — Z720 Tobacco use: Secondary | ICD-10-CM | POA: Diagnosis not present

## 2014-09-09 DIAGNOSIS — R110 Nausea: Secondary | ICD-10-CM | POA: Insufficient documentation

## 2014-09-09 DIAGNOSIS — Z86718 Personal history of other venous thrombosis and embolism: Secondary | ICD-10-CM | POA: Diagnosis not present

## 2014-09-09 DIAGNOSIS — I10 Essential (primary) hypertension: Secondary | ICD-10-CM | POA: Diagnosis not present

## 2014-09-09 DIAGNOSIS — Q230 Congenital stenosis of aortic valve: Secondary | ICD-10-CM

## 2014-09-09 DIAGNOSIS — Z7982 Long term (current) use of aspirin: Secondary | ICD-10-CM | POA: Diagnosis not present

## 2014-09-09 DIAGNOSIS — I351 Nonrheumatic aortic (valve) insufficiency: Secondary | ICD-10-CM | POA: Diagnosis present

## 2014-09-09 DIAGNOSIS — E785 Hyperlipidemia, unspecified: Secondary | ICD-10-CM | POA: Diagnosis present

## 2014-09-09 LAB — BASIC METABOLIC PANEL
ANION GAP: 8 (ref 5–15)
BUN: 10 mg/dL (ref 6–20)
CALCIUM: 9.5 mg/dL (ref 8.9–10.3)
CO2: 27 mmol/L (ref 22–32)
Chloride: 106 mmol/L (ref 101–111)
Creatinine, Ser: 1.1 mg/dL — ABNORMAL HIGH (ref 0.44–1.00)
GFR calc non Af Amer: 55 mL/min — ABNORMAL LOW (ref >60)
Glucose, Bld: 99 mg/dL (ref 65–99)
Potassium: 3.7 mmol/L (ref 3.5–5.1)
SODIUM: 141 mmol/L (ref 135–145)

## 2014-09-09 LAB — I-STAT TROPONIN, ED: Troponin i, poc: 0.02 ng/mL (ref 0.00–0.08)

## 2014-09-09 LAB — CBC
HCT: 42.7 % (ref 36.0–46.0)
Hemoglobin: 14.4 g/dL (ref 12.0–15.0)
MCH: 30.2 pg (ref 26.0–34.0)
MCHC: 33.7 g/dL (ref 30.0–36.0)
MCV: 89.5 fL (ref 78.0–100.0)
PLATELETS: 217 10*3/uL (ref 150–400)
RBC: 4.77 MIL/uL (ref 3.87–5.11)
RDW: 12.2 % (ref 11.5–15.5)
WBC: 7.9 10*3/uL (ref 4.0–10.5)

## 2014-09-09 LAB — BRAIN NATRIURETIC PEPTIDE: B Natriuretic Peptide: 228.5 pg/mL — ABNORMAL HIGH (ref 0.0–100.0)

## 2014-09-09 NOTE — Telephone Encounter (Signed)
ER notes reviewed, unclear cause of her symptoms. Her valve may cause some chest pain, SOB, or dizziness but would not cause nausea or feeling flushed. BP of 140/100 is still within a fairly normal range, and more than likely was increased because she was feeling bad as opposed to be what caused her to feel bad. Would suggest f/u with pcp if symptoms continue and continue to progress with CT surgery for surgery planning   [name] MD

## 2014-09-09 NOTE — Telephone Encounter (Signed)
[name] called from the nurses office at her work stating that her BP was 140/100. Patient is feeling dizzy, flushed with nausea. The nurse is wanting Her to go to the ER at [facility] for evaluation. Will send to  [place].

## 2014-09-09 NOTE — ED Notes (Signed)
MD at bedside. 

## 2014-09-09 NOTE — ED Notes (Signed)
Pt here for chest pain, dizziness, SOB and not feeling well. sts fatigue. Pt scheduled for surgery in a few weeks.

## 2014-09-09 NOTE — ED Notes (Signed)
Cards at bedside

## 2014-09-09 NOTE — ED Provider Notes (Signed)
CSN: [id number]     Arrival date & time 09/09/14  0849 History   First MD Initiated Contact with Patient 09/09/14 0930     Chief Complaint  Patient presents with  . Chest Pain  . Dizziness     (Consider location/radiation/quality/duration/timing/severity/associated sxs/prior Treatment) Patient is a 59 y.o. female presenting with dizziness. The history is provided by the patient. No language interpreter was used.  Dizziness Quality:  Lightheadedness Severity:  Moderate Onset quality:  Sudden Timing:  Intermittent Progression:  Waxing and waning Chronicity:  New Context comment:  At rest Relieved by:  Nothing Worsened by:  Movement Ineffective treatments:  Being still Associated symptoms: chest pain, nausea and shortness of breath   Associated symptoms: no blood in stool, no diarrhea, no headaches, no hearing loss, no palpitations, no syncope, no tinnitus, no vision changes, no vomiting and no weakness   Chest pain:    Quality: sharp     Severity:  Moderate   Timing:  Intermittent   Progression:  Waxing and waning   Chronicity:  Chronic Shortness of breath:    Severity:  Moderate   Onset quality:  Unable to specify   Timing:  Intermittent Risk factors comment:  Known severe aortic stenosis   Past Medical History  Diagnosis Date  . Hypertension   . Hyperlipidemia   . GERD (gastroesophageal reflux disease)   . DVT (deep venous thrombosis)   . Insomnia, unspecified   . Muscle weakness (generalized)   . Peripheral artery disease   . Aortic stenosis, severe 07/20/2014  . Aortic regurgitation 07/20/2014  . Supravalvular aortic stenosis, congenital - s/p repair during childhood    Past Surgical History  Procedure Laterality Date  . Iliac artery stent Left 12/2007  . Cardiac valve surgery  1968  . Cholecystectomy    . Cervical fusion    . Rotator cuff repair Bilateral   . Abdominal aortagram  06/24/12  . Abdominal aortagram N/A 06/24/2012    Procedure: ABDOMINAL Maxcine Ham;   Surgeon: Angelia Mould, MD;  Location: [facility] CATH LAB;  Service: Cardiovascular;  Laterality: N/A;  . Tee without cardioversion N/A 07/07/2014    Procedure: TRANSESOPHAGEAL ECHOCARDIOGRAM (TEE);  Surgeon: Arnoldo Lenis, MD;  Location: [facility] ENDO SUITE;  Service: Cardiology;  Laterality: N/A;  . Tee without cardioversion N/A 07/20/2014    Procedure: TRANSESOPHAGEAL ECHOCARDIOGRAM (TEE) WITH PROPOFOL;  Surgeon: Arnoldo Lenis, MD;  Location: [facility] ORS;  Service: Endoscopy;  Laterality: N/A;  . Left and right heart catheterization with coronary angiogram N/A 07/31/2014    Procedure: LEFT AND RIGHT HEART CATHETERIZATION WITH CORONARY ANGIOGRAM;  Surgeon: Burnell Blanks, MD;  Location: [facility] CATH LAB;  Service: Cardiovascular;  Laterality: N/A;   Family History  Problem Relation Age of Onset  . Hypertension Mother   . Hypertension Father   . Heart disease Father     before age 30  . Other Father     varicose veins   History  Substance Use Topics  . Smoking status: Current Some Day Smoker -- 0.75 packs/day    Types: Cigarettes    Last Attempt to Quit: 05/25/2009  . Smokeless tobacco: Never Used     Comment: started back about 6 months ago   . Alcohol Use: 2.4 oz/week    4 Glasses of wine per week     Comment: 4 drinks per week   OB History    No data available     Review of Systems  Constitutional: Negative for fever,  chills, diaphoresis, activity change, appetite change and fatigue.  HENT: Negative for congestion, facial swelling, hearing loss, rhinorrhea, sore throat and tinnitus.   Eyes: Negative for photophobia and discharge.  Respiratory: Positive for shortness of breath. Negative for cough and chest tightness.   Cardiovascular: Positive for chest pain. Negative for palpitations, leg swelling and syncope.  Gastrointestinal: Positive for nausea. Negative for vomiting, abdominal pain, diarrhea and blood in stool.  Endocrine: Negative for polydipsia and polyuria.   Genitourinary: Negative for dysuria, frequency, difficulty urinating and pelvic pain.  Musculoskeletal: Negative for back pain, arthralgias, neck pain and neck stiffness.  Skin: Negative for color change and wound.  Allergic/Immunologic: Negative for immunocompromised state.  Neurological: Positive for dizziness. Negative for facial asymmetry, weakness, numbness and headaches.  Hematological: Does not bruise/bleed easily.  Psychiatric/Behavioral: Negative for confusion and agitation.      Allergies  Penicillins and Sulfa antibiotics  Home Medications   Prior to Admission medications   Medication Sig Start Date End Date Taking? Authorizing Provider  ALPRAZolam (XANAX) 0.25 MG tablet Take 0.25 mg by mouth daily as needed. 06/09/14  Yes Historical Provider, MD  aspirin 81 MG tablet Take 81 mg by mouth at bedtime.    Yes Historical Provider, MD  calcium-vitamin D (OSCAL WITH D) 500-200 MG-UNIT per tablet Take 1 tablet by mouth at bedtime.   Yes Historical Provider, MD  clonazePAM (KLONOPIN) 0.5 MG tablet Take 1 tablet (0.5 mg total) by mouth at bedtime. 09/26/12  Yes Marcial Pacas, MD  CRANBERRY PO Take 1 tablet by mouth daily.   Yes Historical Provider, MD  cyclobenzaprine (FLEXERIL) 10 MG tablet Take 10 mg by mouth daily as needed for muscle spasms.    Yes Historical Provider, MD  estradiol (ESTRACE) 1 MG tablet Take 1 mg by mouth at bedtime.   Yes Historical Provider, MD  Flaxseed, Linseed, (FLAXSEED OIL PO) Take 1 tablet by mouth at bedtime.    Yes Historical Provider, MD  ibuprofen (ADVIL,MOTRIN) 200 MG tablet Take 400 mg by mouth 4 (four) times daily as needed (pain).    Yes Historical Provider, MD  lisinopril (PRINIVIL,ZESTRIL) 20 MG tablet Take 20 mg by mouth daily.   Yes Historical Provider, MD  MAGNESIUM PO Take 1 tablet by mouth daily.   Yes Historical Provider, MD  medroxyPROGESTERone (PROVERA) 2.5 MG tablet Take 2.5 mg by mouth at bedtime.   Yes Historical Provider, MD  Multiple  Vitamins-Minerals (MULTIVITAMIN WITH MINERALS) tablet Take 1 tablet by mouth at bedtime.    Yes Historical Provider, MD  POTASSIUM PO Take 1 capsule by mouth daily.   Yes Historical Provider, MD  RABEprazole (ACIPHEX) 20 MG tablet Take 1 tablet by mouth at bedtime.  06/08/12  Yes Historical Provider, MD  zolpidem (AMBIEN) 10 MG tablet Take 5 mg by mouth at bedtime as needed for sleep. Takes half 07/05/12  Yes Historical Provider, MD   BP 142/52 mmHg  Pulse 76  Temp(Src) 98.2 F (36.8 C) (Oral)  Resp 11  SpO2 100% Physical Exam  Constitutional: She is oriented to person, place, and time. She appears well-developed and well-nourished. No distress.  HENT:  Head: Normocephalic and atraumatic.  Mouth/Throat: No oropharyngeal exudate.  Eyes: Pupils are equal, round, and reactive to light.  Neck: Normal range of motion. Neck supple.  Cardiovascular: Normal rate and regular rhythm.  Exam reveals no gallop and no friction rub.   Murmur heard.  Systolic murmur is present with a grade of 4/6  Pulmonary/Chest: Effort normal and  breath sounds normal. No respiratory distress. She has no wheezes. She has no rales.  Abdominal: Soft. Bowel sounds are normal. She exhibits no distension and no mass. There is no tenderness. There is no rebound and no guarding.  Musculoskeletal: Normal range of motion. She exhibits no edema or tenderness.  Neurological: She is alert and oriented to person, place, and time.  Skin: Skin is warm and dry.  Psychiatric: She has a normal mood and affect.    ED Course  Procedures (including critical care time) Labs Review Labs Reviewed  BASIC METABOLIC PANEL - Abnormal; Notable for the following:    Creatinine, Ser 1.10 (*)    GFR calc non Af Amer 55 (*)    All other components within normal limits  BRAIN NATRIURETIC PEPTIDE - Abnormal; Notable for the following:    B Natriuretic Peptide 228.5 (*)    All other components within normal limits  CBC  I-STAT TROPOININ, ED     Imaging Review Dg Chest 2 View  09/09/2014   CLINICAL DATA:  Hypertension, shortness of breath and chest pain.  EXAM: CHEST  2 VIEW  COMPARISON:  CT chest and chest radiograph 06/06/2009.  FINDINGS: Trachea is midline. Heart size is at the upper limits of normal. Linear subsegmental atelectasis or scarring in the lingula. Calcified granuloma in the right upper lobe. Lungs are hyperinflated but otherwise clear. No pleural fluid.  IMPRESSION: No acute findings.   Electronically Signed   By: Lorin Picket M.D.   On: 09/09/2014 10:47     EKG Interpretation   Date/Time:  Wednesday Sep 09 2014 09:08:53 EDT Ventricular Rate:  67 PR Interval:  152 QRS Duration: 100 QT Interval:  456 QTC Calculation: 481 R Axis:   89 Text Interpretation:  Normal sinus rhythm Possible Left atrial enlargement  Nonspecific ST and T wave abnormality Prolonged QT Abnormal ECG TWI in I,  II, aVL resolved. Prolonged QT new Confirmed by DOCHERTY  MD, [place] [phone])  on 09/09/2014 9:49:08 AM      MDM   Final diagnoses:  Supravalvular aortic stenosis, congenital - s/p repair during childhood    Pt is a 59 y.o. female with Pmhx as above who presents with lightheadedness, flushed sensation, nausea and generalized malaise today. She has known severe aortic stenosis for which she has seen CT surgery who is recommending aortic valve replacement and possibly aortic root replacement.  She states that she for some time has been having sharp chest pains which are worse with exertion but also sometimes occur at rest.  She also has dyspnea on exertion.  She's had no recent lower extremity edema.  She has had no syncopal episodes.  She also reports that her blood pressure is much more elevated today than her baseline, which is normally around 793 systolic.  On physical exam, vital signs are stable.  She appears anxious but in no acute distress.  She has a loud blowing systolic murmur.  Lungs fields are clear.  No lower extremity  edema. .   Dr. Acie Fredrickson with cardiology has seen patient.  Recommends continued follow-up with CT surgery who recommends plan for upcoming aortic valve/root replacement.  No changes in medications recommended at this time.  Patient is to stay out of work for next several days.  She will have a CT chest tomorrow  NATTALY YEBRA evaluation in the Emergency Department is complete. It has been determined that no acute conditions requiring further emergency intervention are present at this time. The patient/guardian  have been advised of the diagnosis and plan. We have discussed signs and symptoms that warrant return to the ED, such as changes or worsening in symptoms, worsening chest pain, worsening shortness of breath, syncope.       Ernestina Patches, MD 09/09/14 [phone]

## 2014-09-09 NOTE — Discharge Instructions (Signed)
Aortic Valve Replacement  Aortic valve replacement is a procedure to replace an aortic valve that cannot be repaired. An artificial (prosthetic) valve is used to do this. Three types of prosthetic valves are available:   Mechanical valves made entirely from man-made materials.   Donor valves made from human donors. These are only used in special situations.   Biological valves made from animal tissues.  The type of prosthetic valve used will be determined based on various factors, including your age, your lifestyle, and other medical conditions you have.  LET [facility] CARE PROVIDER KNOW ABOUT:  Any allergies you have.  All medicines you are taking, including vitamins, herbs, eye drops, creams, and over-the-counter medicines.  Previous problems you or members of your family have had with the use of anesthetics.  Any blood disorders you have.  Previous surgeries you have had.  Medical conditions you have. RISKS AND COMPLICATIONS Generally, this is a safe procedure. However, as with any procedure, problems can occur. Possible problems include:   Blood clotting caused by the new valve. Replacement with a mechanical valve requires lifelong treatment with medicine to prevent blood clots.   Infection in the new valve.   Valve failure.   Bleeding.   Reaction to anesthetics.  BEFORE THE PROCEDURE  Ask your health care provider about:  Changing or stopping your regular medicines. This is especially important if you are taking diabetes medicines or blood thinners.  Taking medicines such as aspirin and ibuprofen. These medicines can thin your blood. Do not take these medicines before your procedure if your health care provider asks you not to.  Do not eat or drink anything after midnight on the night before the procedure or as directed by your health care provider. PROCEDURE  The surgeon may use either an open technique or a minimally invasive technique for this surgery:    Traditional open surgery  You will be given a medicine that makes you fall asleep (general anesthetic).  You will then be placed on a heart-lung bypass machine. This machine provides oxygen to your blood while the heart is undergoing surgery.  During surgery, the surgeon will make a large cut (incision) in the chest.  The heart will be cooled to slow or stop the heartbeat.  The damaged aortic valve will be removed and replaced with a prosthetic heart valve.  The incision will then be closed with staples or stitches. Minimally invasive surgery This is done through a smaller incision. This still requires general anesthetic and the heart-lung bypass machine. Your heart will be cooled to slow or stop the heartbeat, allowing the damaged valve to be removed and replaced with the new valve. The smaller incision will then be closed. If your condition allows for this procedure, there is often less blood loss, less pain, and faster recovery compared to traditional open surgery.  AFTER THE PROCEDURE You will be monitored closely in a recovery area. From there, you will likely go to an intensive care unit.  Document Released: 08/30/2004 Document Revised: 08/25/2013 Document Reviewed: 09/17/2012 [facility] Patient Information 2015 [place], [place]. This information is not intended to replace advice given to you by your health care provider. Make sure you discuss any questions you have with your health care provider.  Aortic Stenosis Aortic stenosis is a narrowing of the aortic valve. The aortic valve is a gatelike structure that is located between the lower left chamber of the heart (left ventricle) and the blood vessel that leads away from the heart (aorta).  When the aortic valve is narrowed, it does not open all the way. This makes it hard for the heart to pump blood into the aorta and causes the heart to work harder. The extra work can weaken the heart over time and lead to heart failure. CAUSES  Causes  of aortic stenosis include:  Calcium deposits on the aortic valve that have made the valve stiff. This condition generally affects those over the age of 84. It is the most common cause of aortic stenosis.  A birth defect.  Rheumatic fever. This is a problem that may occur after a strep throat infection that was not treated adequately. Rheumatic fever can cause permanent damage to heart valves. SIGNS AND SYMPTOMS  People with aortic stenosis usually have no symptoms until the condition becomes severe. It may take 10-20 years for mild or moderate aortic stenosis to become severe. Symptoms may include:   Shortness of breath, especially with physical activity.   Feeling weak and tired (fatigued) or getting tired easily.  Chest discomfort (angina). This may occur with minimal activity if the aortic stenosis is severe.  An irregular or faster-than-normal heartbeat.  Dizziness or fainting that happens with exertion or after taking certain heart medicines (such as nitroglycerin). DIAGNOSIS  Aortic stenosis is usually diagnosed with a physical exam and with a type of imaging test called echocardiography. During echocardiography, sound waves are used to evaluate how blood flows through the heart. If your health care provider suspects aortic stenosis but the test does not clearly show it, a procedure called cardiac catheterization may be done to diagnose the condition. Tests may also be done to evaluate heart function. They may include:  Electrocardiography. During this test, the electrical impulses of the heart are recorded while you are lying down and sticky patches are placed on your chest, arms, and legs.  Stress tests. There is more than one type of stress test. If a stress test is needed, ask your health care provider about which type is best for you.  Blood tests. TREATMENT  Treatment depends on how severe the aortic stenosis is, your symptoms, and the problems it is causing.    Observation. If the aortic stenosis is mild, no treatment may be needed. However, you will need to have the condition checked regularly to make sure it is not getting worse or causing serious problems.  Surgery. Surgery to repair or replace the aortic valve is the most common treatment for aortic stenosis. Several types of surgeries are available. The most common are open-heart surgery and transcutaneous aortic valve replacement (TAVR). TAVR does not require that the chest be opened. It is usually performed on elderly patients and those who are not able to have open-heart surgery.  Medicines. Medicines may be given to keep symptoms from getting worse. Medicines cannot reverse aortic stenosis. HOME CARE INSTRUCTIONS   You may need to avoid certain types of physical activity. If your aortic stenosis is mild, you may need to avoid only strenuous activity. The more severe your aortic stenosis, the more activities you will need to avoid. Talk with your health care provider about the types of activity you should avoid.  Take medicines only as directed by your health care provider.  If you are a woman with aortic valve stenosis and want to get pregnant, talk to your health care provider before you become pregnant.  If you are a woman with aortic valve stenosis and are pregnant, keep all follow-up visits with all recommended health care providers.  Keep all follow-up visits for tests, exams, and treatments as directed by your health care provider. SEEK IMMEDIATE MEDICAL CARE IF:  You develop chest pain or tightness.   You develop shortness of breath or difficulty breathing.   You develop light-headedness or faint.   It feels like your heartbeat is irregular or faster than normal.  You have a fever. Document Released: 01/07/2003 Document Revised: 08/25/2013 Document Reviewed: 04/04/2012 [facility] Patient Information 2015 [place], [place]. This information is not intended to replace advice  given to you by your health care provider. Make sure you discuss any questions you have with your health care provider.

## 2014-09-09 NOTE — Consult Note (Signed)
CARDIOLOGY CONSULT NOTE   Patient ID: Michelle Patton MRN: [id number] DOB/AGE: [birth year] 59 y.o.  Admit date: 09/09/2014  Primary Physician   Octavio Graves, DO Primary Cardiologist   Dr. Harl Bowie  Reason for Consultation  Severe AS with dizziness  HPI: Michelle Patton is a 59 y.o. 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   The patient states that she underwent open-heart surgery at age 39 for supravalvar aortic stenosis. At the time she lived in [place]. She was told that she had Teflon patch repair of her aorta and that her aortic valve did not need repair. To her recollection there was no mention of any other associated congenital heart defects including no ventricular septal defect nor coarctation of the aorta. She was not allowed to participate in sports during childhood but she otherwise has lived a normal and relatively healthy life ever since. He has been followed periodically with serial echocardiograms, most recently in 2011. She states that she has never been told that there was any sign of developing valve related problems until recently. Over the past 6 months the patient has developed progressive symptoms of exertional shortness of breath and chest discomfort. She underwent a follow-up transthoracic echocardiogram 06/10/2014 that revealed high velocity jet across the aortic valve measuring greater than 5 m/s suggestive of the presence of severe aortic stenosis or possible recurrence of supravalvar aortic stenosis. Left ventricular size and systolic function remained normal with ejection fraction estimated 55-60%. The patient subsequently underwent transesophageal echocardiogram on 07/20/2014 which demonstrated the presence of severe aortic stenosis with a tricuspid aortic valve that had  moderate thickening and sclerosis involving all 3 leaflets of the valve. Peak velocity across aortic valve measured 5.5 m/s corresponding to a mean transvalvular gradient of 64 mmHg. By planimetry the aortic valve area was measured 0.9 cm. The aortic root appeared normal in size and there was no definite sign of residual supravalvular obstruction, although views were somewhat limited. There was moderate aortic insufficiency. Left and right heart catheterization was performed 07/31/2014. This confirmed the presence of severe aortic stenosis with peak-to-peak and mean transvalvular gradients measured 83 and 56 mmHg, respectively by catheterization. Aortic valve area was calculated at 0.52 cm. Pulmonary artery pressures were normal and there was no significant coronary artery disease. The right coronary artery was never directly engaged during catheterization because of unusual angulation from the aortic root, but the vessel did not appear to have significant coronary artery disease. A cardiac gated MRI was performed 08/27/2014 that revealed a trileaflet aortic valve that was abnormal in appearance with significant restriction to leaflet mobility. There was also concern about possible residual membrane or web in the aortic root consistent with residual supravalvular aortic stenosis. The patient was referred for surgical consultation and seen by Dr Roxy Manns on 09/03/14.  The patient is married and lives with her husband in [place]. 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 This does not require any sort of strenuous physical activity. She has been physically active and functional independent all of her life up until recently. The patient states that she has gone down steadily over the past 6 months with progressive symptoms of exertional shortness of breath and chest discomfort. She states that she initially noted that she would get short of breath and fatigued with  more strenuous activity  such as walking at a brisk pain or going up a flight of stairs. Symptoms have progressed substantially such that the patient now gets short of breath with moderate activity, and this has caused her to have to cut back her daily activities significantly. 3 or 4 months ago she began to experience associated symptoms of exertional chest pain which she describes as a sharp achy pain that radiates across the left chest. Symptoms are always associated with stress or physical exertion and shortness of breath. Symptoms are usually relieved fairly quickly with resting, although she states that last week she had a prolonged episode of shortness of breath and chest discomfort after she had been vacuuming in her house. She has had some occasional mild dizzy spells without syncope. She denies any history of resting shortness of breath, PND, orthopnea.   Dr. Roxy Manns saw her on 09/03/14 and discussed her options for surgery. He felt that she had stage D severe symptomatic aortic stenosis with normal left ventricular systolic function. There is severe aortic stenosis with peak velocity across the aortic valve measured greater than 5 m/s. There is mild to moderate aortic insufficiency. There may be some degree of residual supravalvular aortic stenosis with a partial residual membrane that can be visualized on some images of the patient's cardiac MRI. 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 He felt that the patient needs aortic valve replacement, and under the circumstances she probably will need aortic root replacement. He ordered a CTA that is scheduled for tomorrow at 11am at [facility]. She is also looking to get a second opinion at [place] while driving she became nauseated, flushing, dizziness and just generally does  not feeling well. She has had some chest pain and SOB that has not been different than her previous symptoms related to her symptomatic AS. Her BP was noted to be a little elevated at 140/100. She called Dr. Nelly Laurence office and the nurse told her to present to the ED. She is currently feeling much better although she remains fatigued. Her BP is now well controlled. Her tele reveals NSR. All of her lab work looks okay. Her BNP is mildly elevated ~200 but CXR clear and no s/s CHF.     Past Medical History  Diagnosis Date  . Hypertension   . Hyperlipidemia   . GERD (gastroesophageal reflux disease)   . DVT (deep venous thrombosis)   . Insomnia, unspecified   . Muscle weakness (generalized)   . Peripheral artery disease   . Aortic stenosis, severe 07/20/2014  . Aortic regurgitation 07/20/2014  . Supravalvular aortic stenosis, congenital - s/p repair during childhood      Past Surgical History  Procedure Laterality Date  . Iliac artery stent Left 12/2007  . Cardiac valve surgery  1968  . Cholecystectomy    . Cervical fusion    . Rotator cuff repair Bilateral   . Abdominal aortagram  06/24/12  . Abdominal aortagram N/A 06/24/2012    Procedure: ABDOMINAL Maxcine Ham;  Surgeon: Angelia Mould, MD;  Location: [facility] CATH LAB;  Service: Cardiovascular;  Laterality: N/A;  . Tee without cardioversion N/A 07/07/2014    Procedure: TRANSESOPHAGEAL ECHOCARDIOGRAM (TEE);  Surgeon: Arnoldo Lenis, MD;  Location: [facility] ENDO SUITE;  Service: Cardiology;  Laterality: N/A;  . Tee without  cardioversion N/A 07/20/2014    Procedure: TRANSESOPHAGEAL ECHOCARDIOGRAM (TEE) WITH PROPOFOL;  Surgeon: Arnoldo Lenis, MD;  Location: [facility] ORS;  Service: Endoscopy;  Laterality: N/A;  . Left and right heart catheterization with coronary angiogram N/A 07/31/2014    Procedure: LEFT AND RIGHT HEART CATHETERIZATION WITH CORONARY ANGIOGRAM;  Surgeon: Burnell Blanks, MD;  Location: [facility] CATH LAB;  Service: Cardiovascular;   Laterality: N/A;    Allergies  Allergen Reactions  . Penicillins Hives    Have taken Keflex before without any adverse reaction.   . Sulfa Antibiotics     Unknown reaction    I have reviewed the patient's current medications     Prior to Admission medications   Medication Sig Start Date End Date Taking? Authorizing Provider  ALPRAZolam (XANAX) 0.25 MG tablet Take 0.25 mg by mouth daily as needed. 06/09/14  Yes Historical Provider, MD  aspirin 81 MG tablet Take 81 mg by mouth at bedtime.    Yes Historical Provider, MD  calcium-vitamin D (OSCAL WITH D) 500-200 MG-UNIT per tablet Take 1 tablet by mouth at bedtime.   Yes Historical Provider, MD  clonazePAM (KLONOPIN) 0.5 MG tablet Take 1 tablet (0.5 mg total) by mouth at bedtime. 09/26/12  Yes Marcial Pacas, MD  CRANBERRY PO Take 1 tablet by mouth daily.   Yes Historical Provider, MD  cyclobenzaprine (FLEXERIL) 10 MG tablet Take 10 mg by mouth daily as needed for muscle spasms.    Yes Historical Provider, MD  estradiol (ESTRACE) 1 MG tablet Take 1 mg by mouth at bedtime.   Yes Historical Provider, MD  Flaxseed, Linseed, (FLAXSEED OIL PO) Take 1 tablet by mouth at bedtime.    Yes Historical Provider, MD  ibuprofen (ADVIL,MOTRIN) 200 MG tablet Take 400 mg by mouth 4 (four) times daily as needed (pain).    Yes Historical Provider, MD  lisinopril (PRINIVIL,ZESTRIL) 20 MG tablet Take 20 mg by mouth daily.   Yes Historical Provider, MD  MAGNESIUM PO Take 1 tablet by mouth daily.   Yes Historical Provider, MD  medroxyPROGESTERone (PROVERA) 2.5 MG tablet Take 2.5 mg by mouth at bedtime.   Yes Historical Provider, MD  Multiple Vitamins-Minerals (MULTIVITAMIN WITH MINERALS) tablet Take 1 tablet by mouth at bedtime.    Yes Historical Provider, MD  POTASSIUM PO Take 1 capsule by mouth daily.   Yes Historical Provider, MD  RABEprazole (ACIPHEX) 20 MG tablet Take 1 tablet by mouth at bedtime.  06/08/12  Yes Historical Provider, MD  zolpidem (AMBIEN) 10 MG  tablet Take 5 mg by mouth at bedtime as needed for sleep. Takes half 07/05/12  Yes Historical Provider, MD     History   Social History  . Marital Status: Married    Spouse Name: N/A  . Number of Children: 0  . Years of Education: some coll   Occupational History  . call center     [employer]   Social History Main Topics  . Smoking status: Current Some Day Smoker -- 0.75 packs/day    Types: Cigarettes    Last Attempt to Quit: 05/25/2009  . Smokeless tobacco: Never Used     Comment: started back about 6 months ago   . Alcohol Use: 2.4 oz/week    4 Glasses of wine per week     Comment: 4 drinks per week  . Drug Use: No  . Sexual Activity: Not on file   Other Topics Concern  . Not on file   Social History Narrative  Patient is single and lives at home with her partner. Patient works at [employer] center. Some college education. Patient drinks coffee and tea.    Family Status  Relation Status Death Age  . Mother Alive   . Father Alive    Family History  Problem Relation Age of Onset  . Hypertension Mother   . Hypertension Father   . Heart disease Father     before age 26  . Other Father     varicose veins     ROS:  Full 14 point review of systems complete and found to be negative unless listed above.  Physical Exam: Blood pressure 139/60, pulse 62, temperature 98.2 F (36.8 C), temperature source Oral, resp. rate 16, SpO2 98 %.  General: Well developed, well nourished, female in no acute distress Head: Eyes PERRLA, No xanthomas.   Normocephalic and atraumatic, oropharynx without edema or exudate.   Lungs: CTAB Heart: HRRR S1 S2, no rub/gallop, Heart irregular rate and rhythm with S1, S2  murmur. pulses are 2+ extrem.   Neck: No carotid bruits. No lymphadenopathy. No JVD. Abdomen: Bowel sounds present, abdomen soft and non-tender without masses or hernias noted. Msk:  No spine or cva tenderness. No weakness, no joint deformities or effusions. Extremities: No clubbing or  cyanosis.  No edema.  Neuro: Alert and oriented X 3. No focal deficits noted. Psych:  Good affect, responds appropriately Skin: No rashes or lesions noted.  Labs:   Lab Results  Component Value Date   WBC 7.9 09/09/2014   HGB 14.4 09/09/2014   HCT 42.7 09/09/2014   MCV 89.5 09/09/2014   PLT 217 09/09/2014   No results for input(s): INR in the last 72 hours.   Recent Labs Lab 09/09/14 0932  NA 141  K 3.7  CL 106  CO2 27  BUN 10  CREATININE 1.10*  CALCIUM 9.5  GLUCOSE 99    Recent Labs  09/09/14 0949  TROPIPOC 0.02    Radiology:  Dg Chest 2 View  09/09/2014   CLINICAL DATA:  Hypertension, shortness of breath and chest pain.  EXAM: CHEST  2 VIEW  COMPARISON:  CT chest and chest radiograph 06/06/2009.  FINDINGS: Trachea is midline. Heart size is at the upper limits of normal. Linear subsegmental atelectasis or scarring in the lingula. Calcified granuloma in the right upper lobe. Lungs are hyperinflated but otherwise clear. No pleural fluid.  IMPRESSION: No acute findings.   Electronically Signed   By: Lorin Picket M.D.   On: 09/09/2014 10:47    ASSESSMENT AND PLAN:    Active Problems:   Aortic stenosis, severe   Aortic regurgitation   Essential hypertension   Hyperlipidemia   GERD (gastroesophageal reflux disease)   Michelle Patton is a 59 y.o. 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  Dr. Roxy Manns saw her on 09/03/14 and discussed her options for surgery. He felt that she had stage D severe symptomatic aortic stenosis with normal left ventricular systolic function. There is severe aortic stenosis with peak velocity across the aortic valve measured greater than 5 m/s. There is mild to moderate aortic insufficiency. There may be some degree of residual supravalvular aortic stenosis with a partial  residual membrane that can be visualized on some images of the patient's cardiac MRI. 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 He felt that the patient needs aortic valve replacement, and under the circumstances she probably will need aortic root replacement. He ordered a CTA to further characterize the anatomy of the aortic root, with particular reference to the origin of the coronary arteries that is scheduled for tomorrow at 11am at [facility].   Today while driving she became nauseated, flushing, dizziness and just generally does not feeling well. She has had some chest pain and SOB that has not been different than her previous symptoms related to her symptomatic AS. Her BP was noted to be a little elevated at 140/100. She called Dr. Nelly Laurence office and the nurse told her to present to the ED. She is currently feeling much better although she remains fatigued. Her BP is now well controlled. Her tele reveals NSR. All of her lab work looks okay. Her BNP is mildly elevated ~200 but CXR clear and no s/s CHF.   PLAN- These sx could be related to her AS. No acute problems currently. We feel she is stable to send home today. No changes in medications. She will plan to stay hydrated and rest. Continue to progress with CT surgery for surgery planning. CT scan tomorrow. Encouraged close follow up and surgery as soon as possible.    SignedCrista Luria 09/09/2014 12:02 PM  Pager [phone]  Co-Sign MD  Attending Note:   The patient was seen and examined.  Agree with assessment and plan as noted above.  Changes made to the above note as needed.  I agree that the symptoms do not sound necessarily due to her AS but they may be.  She is not in any acute distress at this time. She is to have  her CT scan tomorrow.   I've encouraged her to go ahead and plan on having the surgery with Dr. Roxy Manns soon instead of waiting for a consult at [facility] - especially if her symptoms contineu I've encouraged her to stay hydrated.  Call us for further questions  Keep appt with Dr Harl Bowie    Thayer Headings, Brooke Bonito., MD, [facility] 09/09/2014, 12:27 PM [address]  [place] Pager [phone]

## 2014-09-10 ENCOUNTER — Ambulatory Visit (HOSPITAL_COMMUNITY)
Admission: RE | Admit: 2014-09-10 | Discharge: 2014-09-10 | Disposition: A | Discharge status: Home / Self Care | Payer: BLUE CROSS/BLUE SHIELD | Source: Ambulatory Visit | Attending: Thoracic Surgery (Cardiothoracic Vascular Surgery) | Admitting: Thoracic Surgery (Cardiothoracic Vascular Surgery)

## 2014-09-10 ENCOUNTER — Ambulatory Visit (HOSPITAL_COMMUNITY): Admission: RE | Admit: 2014-09-10 | Payer: BLUE CROSS/BLUE SHIELD | Source: Ambulatory Visit

## 2014-09-10 DIAGNOSIS — I350 Nonrheumatic aortic (valve) stenosis: Secondary | ICD-10-CM | POA: Insufficient documentation

## 2014-09-10 MED ORDER — METOPROLOL TARTRATE 1 MG/ML IV SOLN
INTRAVENOUS | Status: AC
  Filled 2014-09-10: qty 5

## 2014-09-10 MED ORDER — METOPROLOL TARTRATE 1 MG/ML IV SOLN
5.0000 mg | Freq: Once | INTRAVENOUS | Status: AC
  Administered 2014-09-10: 5 mg via INTRAVENOUS

## 2014-09-10 MED ORDER — NITROGLYCERIN 0.4 MG SL SUBL: 0.4000 mg | SUBLINGUAL_TABLET | SUBLINGUAL | Status: DC | PRN

## 2014-09-10 MED ORDER — NITROGLYCERIN 0.4 MG SL SUBL
SUBLINGUAL_TABLET | SUBLINGUAL | Status: AC
  Administered 2014-09-10: 0.4 mg
  Filled 2014-09-10: qty 1

## 2014-09-11 ENCOUNTER — Other Ambulatory Visit: Payer: Self-pay | Admitting: *Deleted

## 2014-09-11 DIAGNOSIS — I350 Nonrheumatic aortic (valve) stenosis: Secondary | ICD-10-CM

## 2014-09-16 ENCOUNTER — Encounter (HOSPITAL_COMMUNITY): Payer: Self-pay

## 2014-09-16 ENCOUNTER — Telehealth: Payer: Self-pay | Admitting: Cardiology

## 2014-09-16 ENCOUNTER — Ambulatory Visit (HOSPITAL_COMMUNITY)
Admission: RE | Admit: 2014-09-16 | Discharge: 2014-09-16 | Disposition: A | Discharge status: Home / Self Care | Payer: BLUE CROSS/BLUE SHIELD | Source: Ambulatory Visit | Attending: Thoracic Surgery (Cardiothoracic Vascular Surgery) | Admitting: Thoracic Surgery (Cardiothoracic Vascular Surgery)

## 2014-09-16 DIAGNOSIS — I350 Nonrheumatic aortic (valve) stenosis: Secondary | ICD-10-CM | POA: Diagnosis not present

## 2014-09-16 DIAGNOSIS — M47894 Other spondylosis, thoracic region: Secondary | ICD-10-CM | POA: Diagnosis not present

## 2014-09-16 DIAGNOSIS — R918 Other nonspecific abnormal finding of lung field: Secondary | ICD-10-CM | POA: Diagnosis not present

## 2014-09-16 MED ORDER — IOHEXOL 350 MG/ML SOLN
80.0000 mL | Freq: Once | INTRAVENOUS | Status: AC | PRN
  Administered 2014-09-16: 80 mL via INTRAVENOUS

## 2014-09-16 MED ORDER — METOPROLOL TARTRATE 1 MG/ML IV SOLN
INTRAVENOUS | Status: AC
  Administered 2014-09-16: 2.5 mg via INTRAVENOUS
  Filled 2014-09-16: qty 5

## 2014-09-16 MED ORDER — METOPROLOL TARTRATE 1 MG/ML IV SOLN
2.5000 mg | Freq: Once | INTRAVENOUS | Status: AC
  Administered 2014-09-16: 2.5 mg via INTRAVENOUS
  Filled 2014-09-16: qty 5

## 2014-09-16 NOTE — Telephone Encounter (Signed)
Patient walked into office to follow up on message that was left this morning. Patient did see her PCP today and her BP was 149/78. Nurse confirmed that she is taking lisinopril 20 mg daily for her BP. Nurse advised patient that she should continue to monitor her BP and let our office know if it continues to run a little high.

## 2014-09-16 NOTE — Telephone Encounter (Signed)
Michelle Patton called stating that her BP was elevated. States that she was told by a nurse to see cardiologist today. Dr. Harl Bowie is out of the  Office until tomorrow. Offered her an appointment tomorrow with Dr. Harl Bowie. States that she is going to see Dr. Melina Copa for a bug bite And she will check with her.

## 2014-09-22 ENCOUNTER — Encounter: Payer: Self-pay | Admitting: Thoracic Surgery (Cardiothoracic Vascular Surgery)

## 2014-09-22 ENCOUNTER — Ambulatory Visit (INDEPENDENT_AMBULATORY_CARE_PROVIDER_SITE_OTHER): Payer: BLUE CROSS/BLUE SHIELD | Admitting: Thoracic Surgery (Cardiothoracic Vascular Surgery)

## 2014-09-22 VITALS — BP 129/79 | HR 80 | Resp 20 | Ht 66.5 in | Wt 195.0 lb

## 2014-09-22 DIAGNOSIS — I351 Nonrheumatic aortic (valve) insufficiency: Secondary | ICD-10-CM

## 2014-09-22 DIAGNOSIS — Q230 Congenital stenosis of aortic valve: Secondary | ICD-10-CM

## 2014-09-22 DIAGNOSIS — I350 Nonrheumatic aortic (valve) stenosis: Secondary | ICD-10-CM | POA: Diagnosis not present

## 2014-09-22 NOTE — Patient Instructions (Signed)
Patient has been instructed to stop taking aspirin  Patient should continue taking all other medications without change through the day before surgery.  Patient should have nothing to eat or drink after midnight the night before surgery.  On the morning of surgery patient should take only Aciphex with a sip of water.

## 2014-09-22 NOTE — Progress Notes (Signed)
[address]       [place] [address]             [phone]     CARDIOTHORACIC SURGERY OFFICE NOTE  Referring Provider is Branch, Alphonse Guild, MD PCP is Octavio Graves, DO   HPI:  Patient returns for follow-up of stage D severe symptomatic aortic stenosis with history of previous patch repair of supravalvular aortic stenosis in the remote past. She was originally seen in consultation on 09/03/2014. Since then she underwent cardiac gated CT angiogram of the heart to further characterize the anatomy of the aortic root including the origin of the left main and right coronary arteries. She returns to the office today to discuss the results of this test and potentially make plans for surgery. Over the past 2 weeks she was seen transiently in the emergency department on 09/09/2014 with an acute exacerbation of chest tightness and lightheadedness. She was evaluated briefly in the emergency department and discharged home without any complicating events.  She otherwise reports no new problems or complaints.  She specifically denies any fevers chills or productive cough. She denies any prolonged episodes of chest pain or chest tightness on relieved by rest. She has apparently been under increased stress at work and she admits that she has also been exerting herself physically more strenuously then she probably should be. She has not had any severe dizzy spells nor syncope. She denies resting shortness of breath.   Current Outpatient Prescriptions  Medication Sig Dispense Refill  . aspirin 81 MG tablet Take 81 mg by mouth at bedtime.     . calcium-vitamin D (OSCAL WITH D) 500-200 MG-UNIT per tablet Take 1 tablet by mouth at bedtime.    . clonazePAM (KLONOPIN) 0.5 MG tablet Take 1 tablet (0.5 mg total) by mouth at bedtime. 30 tablet 5  . CRANBERRY PO Take 1 tablet by mouth daily.    . cyclobenzaprine (FLEXERIL) 10 MG tablet Take 10 mg by mouth daily as needed for muscle spasms.      [name] estradiol (ESTRACE) 1 MG tablet Take 1 mg by mouth at bedtime.    . Flaxseed, Linseed, (FLAXSEED OIL PO) Take 1 tablet by mouth at bedtime.     [name] ibuprofen (ADVIL,MOTRIN) 200 MG tablet Take 400 mg by mouth 4 (four) times daily as needed (pain).     [name] lisinopril (PRINIVIL,ZESTRIL) 20 MG tablet Take 20 mg by mouth daily.    [name] MAGNESIUM PO Take 1 tablet by mouth daily.    . medroxyPROGESTERone (PROVERA) 2.5 MG tablet Take 2.5 mg by mouth at bedtime.    . Multiple Vitamins-Minerals (MULTIVITAMIN WITH MINERALS) tablet Take 1 tablet by mouth at bedtime.     [name] POTASSIUM PO Take 1 capsule by mouth daily.    . RABEprazole (ACIPHEX) 20 MG tablet Take 1 tablet by mouth at bedtime.     [name] zolpidem (AMBIEN) 10 MG tablet Take 5 mg by mouth at bedtime as needed for sleep. Takes half     No current facility-administered medications for this visit.      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 Echocardiogram showed elevated gradients across the aortic valve consistent with severe aortic stenosis and no definite sign of residual supravalvular obstruction. Cardiac MRI shows possible residual membrane or web in the aortic root suspicious for residual supravalvular aortic stenosis.  TECHNIQUE: The patient was scanned on a Philips 256 scanner. A 120 kV retrospective scan was triggered in the descending thoracic aorta at 111 HU's. Gantry  rotation speed was 270 msecs and collimation was .9 mm. 2.5 of iv Metoprolol and no nitro were given. The 3D data set was reconstructed in 5% intervals of the R-R cycle. Systolic and diastolic phases were analyzed on a dedicated work station using MPR, MIP and VRT modes. The patient received 80 cc of contrast.  FINDINGS: Aortic Valve: Trileaflet, severely thickened, with moderate calcifications predominantly of the non-coronary and left leaflet, no subvalvular calcifications. There is severely restricted leaflet opening in systole.  There is no membrane seen above the aortic valve and no re-stenosis is seen above the valve. A surgical patch is seen from the ascending aorta (at the level of pulmonary artery) extending into the coronary sinuses.  There is an unusual appearance of the right and left coronary cusps, they both appears narrowed with reduced communication between the cusp and aorta itself. Right cusp has very limited communication with the aorta with a small orifice measuring 3.5 x 3.5 mm. This narrowing is most probably a result of patch/scar strictures. This is possibly limiting blood flow into the RCA.  Aorta: Normal caliber, minimal calcifications, no coarctation or dissection.  Sinotubular Junction (repaired): 22 x 21 mm  Ascending Thoracic Aorta: 25 x 25 mm  Aortic Arch: 22 x 21 mm  Descending Thoracic Aorta: 20 x 20 mm  Sinus of Valsalva Measurements:  Non-coronary: 25 mm  Right -coronary: 24 mm  Left -coronary: 26 mm  Coronary Arteries: Normal origin. Right dominance. Minimal non-obstructive CAD.  Other findings:  Normal pulmonary vein drainage (2 on the right and 2 on the left).  No other congenital anomalies were found such as ASD, VSD or aortic coarctation.  There are no signs of prior sternotomy, surgical wires are seen. There is a good distance between anterior portions of the heart and no adhesions to the sternum  are seen.  IMPRESSION: 1. Trileaflet, severely thickened, moderately calcified aortic valve with significantly limited leaflet opening. No supravalvular membrane of supravalvular re-stenosis is seen.  2. Right and left coronary cusp are narrowed with limited communication between the cusps and aorta itself with possible limited blood supply to RCA. This narrowing is most probably a result of patch/scar strictures.  3. Normal coronary origin, right dominance, minimal non-obstructive CAD.  4. No other congenital abnormalities were identified.  [name]   Electronically Signed  By: [name]  On: 09/17/2014 14:30      Addendum by Provider Default, MD on Fri Sep 18, 2014 7:09 PM    ADDENDUM REPORT: 09/18/2014 19:06  ADDENDUM: Coronary Artery Height above Annulus:  Left Main: 9 mm  Right Coronary: 15 mm  Virtual Basal Annulus Measurements:  Maximum/Minimum Diameter: 26 x 20 mm  Perimeter: 89 mm  Area: 411 mm2  [name]   Electronically Signed  By: Houston Siren  Nelson  On: 09/18/2014 19:06    OVER-READ INTERPRETATION CT CHEST  The following report is an over-read performed by radiologist Dr. Norlene Duel [facility] on 09/16/2014. This over-read does not include interpretation of cardiac or coronary anatomy or pathology. The coronary calcium score/coronary CTA interpretation by the cardiologist is attached.  COMPARISON: None.  FINDINGS: Mediastinum: The trachea is patent and appears midline. The visualized portions of the esophagus are unremarkable. There is a a right paratracheal lymph node which measures 9 mm, image 20 of series 514.  Lungs/Pleura: There is no pleural effusion identified. Dependent changes are noted within the posterior lung bases. Scar versus platelike atelectasis noted within the lingula. Within the left upper lobe there is a 4 mm nodule, image number 6 of series  512. 6 mm nodule within the right upper lobe is identified, image number 2 of series 512.  Upper Abdomen: There is no suspicious liver abnormality. The visualized portions of the spleen are normal.  Musculoskeletal: Mild spondylosis noted within the thoracic spine.  IMPRESSION: 1. No acute pulmonary abnormalities noted. 2. Small nodules are identified in the upper lobes. The largest measures 6 mm. If the patient is at high risk for bronchogenic carcinoma, follow-up chest CT at 6-12 months is recommended. If the patient is at low risk for bronchogenic carcinoma, follow-up chest CT at 12 months is recommended. This recommendation follows the consensus statement: Guidelines for Management of Small Pulmonary Nodules Detected on CT Scans: A Statement from the [employer] as published in Radiology 2005;237:395-400.  Electronically Signed: By: Kerby Moors M.D. On: 09/16/2014 10:16   Impression:  Patient has stage D severe symptomatic aortic stenosis with normal left ventricular systolic function. She reportedly underwent patch repair of her aortic root for supravalvular aortic stenosis at age 67. I have personally reviewed the patient's recent echocardiograms, catheterization, cardiac MRI and cardiac-gated CT angiogram of the heart. The patient's aortic valve is tricuspid but all 3 leaflets are abnormal with significant thickening and restricted leaflet mobility. There is mild prolapse of one of the leaflets. There is severe aortic stenosis with peak velocity across the aortic valve measured greater than 5 m/s. There is mild to moderate aortic insufficiency. I do not think there is significant residual supravalvular aortic stenosis. The original patch repair appears to be a single patch repair involving only the non-coronary sinus of Valsalva. The patient has a left dominant coronary circulation with no significant coronary artery disease, although the origin of the right coronary  artery is severely angulated and appears to be immediately below the patch used for the patient's original surgery.  This angulation could be associated with some hemodynamic compromise of flow into the right coronary artery, although this is probably unlikely. I agree that the patient needs aortic valve replacement, and under the circumstances she probably will need aortic root replacement.  Transcatheter aortic valve replacement might technically be feasible and associated with considerably less operative risk, but long-term durability might be very problematic in this relatively young patient.    Plan:  The patient and her husband were again counseled at length regarding treatment alternatives for management of severe aortic stenosis including continued medical therapy versus proceeding with aortic valve replacement in the near future.  The natural history of aortic stenosis was reviewed, as was long term prognosis with medical therapy alone.  Surgical options were discussed at length including conventional surgical aortic valve or root replacement using either a mechanical prosthesis or a bioprosthetic tissue valve.  Other alternatives  including human allograft (homograft) aortic root replacement, stentless porcine aortic root replacement, and transcatheter aortic valve replacement were discussed.  Discussion was held comparing the relative risks of mechanical valve replacement with need for lifelong anticoagulation versus use of a bioprosthetic tissue valve and the associated potential for late structural valve deterioration and failure.  This discussion was placed in the context of the patient's particular circumstances, and as a result the patient specifically requests that their valve be replaced using a tissue valve.  The patient understands and accepts all potential associated risks of surgery including but not limited to risk of death, stroke, myocardial infarction, congestive heart failure,  respiratory failure, renal failure, pneumonia, bleeding requiring blood transfusion and or reexploration, arrhythmia, heart block or bradycardia requiring permanent pacemaker, aortic dissection or other major vascular complication, pleural effusions or other delayed complications related to continued congestive heart failure, and other late complications related to valve replacement including structural valve deterioration and failure, thrombosis, endocarditis, or paravalvular leak.  We plan to proceed with surgery on Wednesday, 10/07/2014. The patient has been instructed to stop smoking completely immediately. She has also been instructed to stop taking aspirin between now and the time of surgery. All of her questions have been addressed.   I spent in excess of 30 minutes during the conduct of this office consultation and >50% of this time involved direct face-to-face encounter with the patient for counseling and/or coordination of their care.  Valentina Gu. Roxy Manns, MD 09/22/2014 3:22 PM

## 2014-09-23 ENCOUNTER — Other Ambulatory Visit: Payer: Self-pay | Admitting: *Deleted

## 2014-09-23 DIAGNOSIS — I350 Nonrheumatic aortic (valve) stenosis: Secondary | ICD-10-CM

## 2014-09-24 DIAGNOSIS — Z736 Limitation of activities due to disability: Secondary | ICD-10-CM

## 2014-10-03 NOTE — Pre-Procedure Instructions (Signed)
SONNIE BIAS  10/03/2014      Your procedure is scheduled on June 15  Report to [facility] Admitting at 6:30 A.M.  Call this number if you have problems the morning of surgery:  [phone]   Remember:  Do not eat food or drink liquids after midnight.  Take these medicines the morning of surgery with A SIP OF WATER None   STOP Aspirin, Calcium, Vitamin D, Cranberry, Flaxseed, Ibuprofen, Magnesium,  Multiple Vitamin starting today   STOP/ Do not take Aspirin, Aleve, Naproxen, Advil, Ibuprofen, Motrin, Vitamins, Herbs, or Supplements starting today   Do not wear jewelry, make-up or nail polish.  Do not wear lotions, powders, or perfumes.  You may wear deodorant.  Do not shave 48 hours prior to surgery.  Men may shave face and neck.  Do not bring valuables to the hospital.  [facility] is not responsible for any belongings or valuables.  Contacts, dentures or bridgework may not be worn into surgery.  Leave your suitcase in the car.  After surgery it may be brought to your room.  For patients admitted to the hospital, discharge time will be determined by your treatment team.  Patients discharged the day of surgery will not be allowed to drive home.   [facility] - Preparing for Surgery  Before surgery, you can play an important role.  Because skin is not sterile, your skin needs to be as free of germs as possible.  You can reduce the number of germs on you skin by washing with CHG (chlorahexidine gluconate) soap before surgery.  CHG is an antiseptic cleaner which kills germs and bonds with the skin to continue killing germs even after washing.  Please DO NOT use if you have an allergy to CHG or antibacterial soaps.  If your skin becomes reddened/irritated stop using the CHG and inform your nurse when you arrive at Short Stay.  Do not shave (including legs and underarms) for at least 48 hours prior to the first CHG shower.  You may shave your face.  Please follow  these instructions carefully:   1.  Shower with CHG Soap the night before surgery and the morning of Surgery.  2.  If you choose to wash your hair, wash your hair first as usual with your normal shampoo.  3.  After you shampoo, rinse your hair and body thoroughly to remove the shampoo.  4.  Use CHG as you would any other liquid soap.  You can apply CHG directly to the skin and wash gently with scrungie or a clean washcloth.  5.  Apply the CHG Soap to your body ONLY FROM THE NECK DOWN.  Do not use on open wounds or open sores.  Avoid contact with your eyes, ears, mouth and genitals (private parts).  Wash genitals (private parts) with your normal soap.  6.  Wash thoroughly, paying special attention to the area where your surgery will be performed.  7.  Thoroughly rinse your body with warm water from the neck down.  8.  DO NOT shower/wash with your normal soap after using and rinsing off the CHG Soap.  9.  Pat yourself dry with a clean towel.            10.  Wear clean pajamas.            11.  Place clean sheets on your bed the night of your first shower and do not sleep with pets.  Day  of Surgery  Do not apply any lotions the morning of surgery.  Please wear clean clothes to the hospital/surgery center.  Please read over the following fact sheets that you were given. Pain Booklet, Coughing and Deep Breathing, Blood Transfusion Information, Open Heart Packet and Surgical Site Infection Prevention

## 2014-10-05 ENCOUNTER — Encounter (HOSPITAL_COMMUNITY): Payer: Self-pay

## 2014-10-05 ENCOUNTER — Ambulatory Visit (HOSPITAL_COMMUNITY)
Admission: RE | Admit: 2014-10-05 | Discharge: 2014-10-05 | Disposition: A | Discharge status: Home / Self Care | Payer: BLUE CROSS/BLUE SHIELD | Source: Ambulatory Visit | Attending: Thoracic Surgery (Cardiothoracic Vascular Surgery) | Admitting: Thoracic Surgery (Cardiothoracic Vascular Surgery)

## 2014-10-05 ENCOUNTER — Encounter (HOSPITAL_COMMUNITY)
Admission: RE | Admit: 2014-10-05 | Discharge: 2014-10-05 | Disposition: A | Discharge status: Home / Self Care | Payer: BLUE CROSS/BLUE SHIELD | Source: Ambulatory Visit | Attending: Thoracic Surgery (Cardiothoracic Vascular Surgery) | Admitting: Thoracic Surgery (Cardiothoracic Vascular Surgery)

## 2014-10-05 VITALS — BP 103/63 | HR 60 | Temp 97.8°F | Resp 20 | Ht 66.0 in | Wt 198.8 lb

## 2014-10-05 DIAGNOSIS — R9431 Abnormal electrocardiogram [ECG] [EKG]: Secondary | ICD-10-CM

## 2014-10-05 DIAGNOSIS — K219 Gastro-esophageal reflux disease without esophagitis: Secondary | ICD-10-CM

## 2014-10-05 DIAGNOSIS — E785 Hyperlipidemia, unspecified: Secondary | ICD-10-CM | POA: Insufficient documentation

## 2014-10-05 DIAGNOSIS — R42 Dizziness and giddiness: Secondary | ICD-10-CM

## 2014-10-05 DIAGNOSIS — Z0183 Encounter for blood typing: Secondary | ICD-10-CM | POA: Insufficient documentation

## 2014-10-05 DIAGNOSIS — Z01818 Encounter for other preprocedural examination: Secondary | ICD-10-CM | POA: Insufficient documentation

## 2014-10-05 DIAGNOSIS — G4733 Obstructive sleep apnea (adult) (pediatric): Secondary | ICD-10-CM | POA: Insufficient documentation

## 2014-10-05 DIAGNOSIS — I350 Nonrheumatic aortic (valve) stenosis: Secondary | ICD-10-CM

## 2014-10-05 DIAGNOSIS — Z01812 Encounter for preprocedural laboratory examination: Secondary | ICD-10-CM

## 2014-10-05 DIAGNOSIS — I10 Essential (primary) hypertension: Secondary | ICD-10-CM

## 2014-10-05 HISTORY — DX: Personal history of pneumonia (recurrent): Z87.01

## 2014-10-05 HISTORY — DX: Sleep apnea, unspecified: G47.30

## 2014-10-05 HISTORY — DX: Family history of other specified conditions: Z84.89

## 2014-10-05 LAB — COMPREHENSIVE METABOLIC PANEL
ALT: 21 U/L (ref 14–54)
ANION GAP: 9 (ref 5–15)
AST: 24 U/L (ref 15–41)
Albumin: 4.2 g/dL (ref 3.5–5.0)
Alkaline Phosphatase: 48 U/L (ref 38–126)
BUN: 11 mg/dL (ref 6–20)
CALCIUM: 9.7 mg/dL (ref 8.9–10.3)
CHLORIDE: 109 mmol/L (ref 101–111)
CO2: 21 mmol/L — ABNORMAL LOW (ref 22–32)
CREATININE: 0.99 mg/dL (ref 0.44–1.00)
GFR calc Af Amer: >60 mL/min (ref >60)
GLUCOSE: 100 mg/dL — AB (ref 65–99)
Potassium: 3.9 mmol/L (ref 3.5–5.1)
Sodium: 139 mmol/L (ref 135–145)
Total Bilirubin: 0.6 mg/dL (ref 0.3–1.2)
Total Protein: 7 g/dL (ref 6.5–8.1)

## 2014-10-05 LAB — BLOOD GAS, ARTERIAL
Acid-base deficit: 1.9 mmol/L (ref 0.0–2.0)
Bicarbonate: 21.6 mEq/L (ref 20.0–24.0)
Drawn by: 421801
FIO2: 0.21 %
O2 SAT: 98.7 %
Patient temperature: 98.6
TCO2: 22.6 mmol/L (ref 0–100)
pCO2 arterial: 31.9 mmHg — ABNORMAL LOW (ref 35.0–45.0)
pH, Arterial: 7.446 (ref 7.350–7.450)
pO2, Arterial: 103 mmHg — ABNORMAL HIGH (ref 80.0–100.0)

## 2014-10-05 LAB — URINALYSIS, ROUTINE W REFLEX MICROSCOPIC
Bilirubin Urine: NEGATIVE
Glucose, UA: NEGATIVE mg/dL
Hgb urine dipstick: NEGATIVE
KETONES UR: NEGATIVE mg/dL
LEUKOCYTES UA: NEGATIVE
Nitrite: NEGATIVE
PROTEIN: NEGATIVE mg/dL
Specific Gravity, Urine: 1.009 (ref 1.005–1.030)
Urobilinogen, UA: 0.2 mg/dL (ref 0.0–1.0)
pH: 7 (ref 5.0–8.0)

## 2014-10-05 LAB — PULMONARY FUNCTION TEST
DL/VA % pred: 65 %
DL/VA: 3.28 ml/min/mmHg/L
DLCO cor % pred: 74 %
DLCO cor: 20.11 ml/min/mmHg
DLCO unc % pred: 76 %
DLCO unc: 20.7 ml/min/mmHg
FEF 25-75 Post: 2.77 L/sec
FEF 25-75 Pre: 2.34 L/sec
FEF2575-%CHANGE-POST: 18 %
FEF2575-%PRED-POST: 107 %
FEF2575-%Pred-Pre: 91 %
FEV1-%CHANGE-POST: 3 %
FEV1-%PRED-POST: 108 %
FEV1-%Pred-Pre: 105 %
FEV1-POST: 3.06 L
FEV1-PRE: 2.95 L
FEV1FVC-%Change-Post: 3 %
FEV1FVC-%PRED-PRE: 95 %
FEV6-%Change-Post: 2 %
FEV6-%Pred-Post: 111 %
FEV6-%Pred-Pre: 108 %
FEV6-POST: 3.89 L
FEV6-Pre: 3.8 L
FEV6FVC-%Change-Post: 1 %
FEV6FVC-%Pred-Post: 101 %
FEV6FVC-%Pred-Pre: 99 %
FVC-%Change-Post: 0 %
FVC-%Pred-Post: 109 %
FVC-%Pred-Pre: 108 %
FVC-Post: 3.95 L
FVC-Pre: 3.93 L
POST FEV1/FVC RATIO: 78 %
PRE FEV6/FVC RATIO: 97 %
Post FEV6/FVC ratio: 98 %
Pre FEV1/FVC ratio: 75 %
RV % PRED: 148 %
RV: 3.02 L
TLC % PRED: 132 %
TLC: 7.07 L

## 2014-10-05 LAB — CBC
HCT: 42 % (ref 36.0–46.0)
Hemoglobin: 14.5 g/dL (ref 12.0–15.0)
MCH: 30.3 pg (ref 26.0–34.0)
MCHC: 34.5 g/dL (ref 30.0–36.0)
MCV: 87.7 fL (ref 78.0–100.0)
Platelets: 166 10*3/uL (ref 150–400)
RBC: 4.79 MIL/uL (ref 3.87–5.11)
RDW: 12 % (ref 11.5–15.5)
WBC: 8.3 10*3/uL (ref 4.0–10.5)

## 2014-10-05 LAB — SURGICAL PCR SCREEN
MRSA, PCR: NEGATIVE
Staphylococcus aureus: POSITIVE — AB

## 2014-10-05 LAB — PROTIME-INR
INR: 1.11 (ref 0.00–1.49)
Prothrombin Time: 14.5 seconds (ref 11.6–15.2)

## 2014-10-05 LAB — APTT: aPTT: 31 seconds (ref 24–37)

## 2014-10-05 LAB — ABO/RH: ABO/RH(D): A POS

## 2014-10-05 MED ORDER — ALBUTEROL SULFATE (2.5 MG/3ML) 0.083% IN NEBU
2.5000 mg | INHALATION_SOLUTION | Freq: Once | RESPIRATORY_TRACT | Status: AC
  Administered 2014-10-05: 2.5 mg via RESPIRATORY_TRACT

## 2014-10-05 NOTE — Progress Notes (Signed)
Patient reports during first TEE attempt the procedure had to be cancelled d/t not being able to get her sedated. Ebony Hail, [place] notified. Requested patient notify anestheologist DOS of same

## 2014-10-05 NOTE — Progress Notes (Signed)
Anesthesia Chart Review:  Patient is a 59 year old female scheduled for redo AVR, possible ascending aortic root replacement, possible CABG, TEE on 10/07/14 by Dr. Roxy Manns. OR room is booked for approximately 12 hours.   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 For anesthesia concerns, she stated that sedation with midazolam 5 mg and fentanyl 225 mg was ineffective for TEE on 07/07/14.  She later underwent TEE with anesthesia on 07/20/14 using midazolam and propofol.   PCP is Dr. Octavio Graves.  Cardiologist is Dr. Harl Bowie.    07/20/14 TEE: Normal LV systolic function, EF 37-85%. Normal wall motion with no regional wall motion abnormalities. Grade 1 diastolic dysfunction. Severe aortic stenosis. Patient with history of supravalvular aortic stenosis with patch repair roughly 50 years ago. Severalviews of the aortic root and proximal ascending aorta takenwithout evidence of supravalvular obstruction, appears to besevere valvular stenosis. Mild tomoderate regurgitation. The AI VC is 0.3 cm. Peak velocity (S):550.85 cm/s. Mean gradient (S): 64 mm Hg. Valve area (VTI): 0.75cm^2. Valve area (Vmax): 0.66 cm^2. AVA by planimetry 0.9 cmsquared.  07/31/14 Cardiac Cath: Hemodynamic Findings: Ao: 107/55  LV: 190/12/15 RA: 1  RV: 41/2/6 PA: 26/6 (mean 15)  PCWP: 7 Fick Cardiac Output: 4.76 L/min Fick Cardiac Index: 2.38 L/min/m2 Central Aortic Saturation: 96% Pulmonary Artery Saturation: 66% Aortic valve data:  Peak to peak gradient 83 mm Hg Mean gradient 56 mmHg AVA 0.52 cm2 Angiographic Findings: Left main: Normal caliber vessel with no obstructive disease.  Left Anterior Descending Artery: Large caliber vessel that courses to the apex. Moderate caliber diagonal branch. No obstructive disease.  Circumflex Artery: Large  caliber vessel with moderate caliber obtuse marginal branch. No obstructive disease.  Right Coronary Artery: Large dominant vessel with no obstructive disease.  Left Ventricular Angiogram: LVEF=60% Aortic root angiogram: The aortic root is not enlarged. There is supravalvular density. Aortic valve calcification noted on plain fluoroscopy.  Impression: 1. No angiographic evidence of CAD 2. Normal LV systolic function 3. Severe gradient across the aortic valve into the aorta with density noted in the aortic root. Cannot exclude recurrent supravalvular stenosis along with aortic valve stenosis. 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  4. Normal filling pressures. Recommendations: I have reviewed the case in detail this am with my imaging partner. The TEE is also reviewed. It is difficult to tell if this is primarily aortic valve stenosis or if there is also involvement of the aortic root which is also clearly abnormal post repair as a child. Will need cardiac MRI/MRA of the thoracic aorta to better define. (Dr. Lauree Chandler)  08/27/14 Cardiac MRI: IMPRESSION: 1. Normal LV size with mild to moderate LV hypertrophy, EF 59%. 2. Normal RV size and systolic function. 3. Misshapen and thickened but trileaflet aortic valve. One leaflet has some degree of prolapse. There was restriction to aortic valve opening with a degree of stenosis. Just superior to the aortic valve, I am concerned about possible residual membrane or web creating additional obstruction. This is difficult to visualize.  09/16/14 Cardiac Gated CTA Heart: IMPRESSION: 1. Trileaflet, severely thickened, moderately calcified aortic valve with significantly limited leaflet opening. No supravalvular membrane of supravalvular re-stenosis is seen. 2. Right and left coronary cusp are narrowed with limited communication between the cusps and aorta itself with possible limited blood supply to  RCA. This narrowing is  most probably a result of patch/scar strictures. 3. Normal coronary origin, right dominance, minimal non-obstructive CAD. 4. No other congenital abnormalities were identified.  10/05/14 EKG: NSR, anterior infarct (age undetermined).  Preoperative CXR and PFTs noted. Pre-CABG doppler results are still pending.  Preoperative labs noted.    Patient reported dizzy spells X 1 week.  TCTS already notified. If no acute changes then I would anticipate that she could proceed as planned.   George Hugh [facility] Short Stay Center/Anesthesiology Phone [phone] 10/05/2014 5:38 PM

## 2014-10-05 NOTE — H&P (Signed)
[address]       [place], [address]             [phone]          CARDIOTHORACIC SURGERY HISTORY AND PHYSICAL EXAM  Referring Provider is Branch, Alphonse Guild, MD PCP is Octavio Graves, DO  Chief Complaint  Patient presents with  . 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 The patient states that she underwent open-heart surgery at age 29 for supravalvar aortic stenosis. At the time she lived in [place]. She was told that she had Teflon patch repair of her aorta and that her aortic valve did not need repair. To her recollection there was no mention of any other associated congenital heart defects including no ventricular septal defect nor coarctation of the aorta. She was not allowed to participate in sports during childhood but she otherwise has lived a normal and relatively healthy life ever since. He has been followed periodically with serial echocardiograms, most recently in 2011. She states that she has never been told that there was any sign of developing valve related problems until recently. Over the past 6 months the patient has developed progressive symptoms of exertional shortness of breath and chest discomfort. She underwent a follow-up transthoracic echocardiogram 06/10/2014 that revealed high velocity jet across the aortic valve measuring greater than 5 m/s suggestive of the presence of severe aortic stenosis or possible recurrence of supravalvar aortic stenosis. Left ventricular size and systolic function remained normal with  ejection fraction estimated 55-60%. The patient subsequently underwent transesophageal echocardiogram on 07/20/2014 which demonstrated the presence of severe aortic stenosis with a tricuspid aortic valve that had moderate thickening and sclerosis involving all 3 leaflets of the valve. Peak velocity across aortic valve measured 5.5 m/s corresponding to a mean transvalvular gradient of 64 mmHg. By planimetry the aortic valve area was measured 0.9 cm. The aortic root appeared normal in size and there was no definite sign of residual supravalvular obstruction, although views were somewhat limited. There was moderate aortic insufficiency. Left and right heart catheterization was performed 07/31/2014. This confirmed the presence of severe aortic stenosis with peak-to-peak and mean transvalvular gradients measured 83 and 56 mmHg, respectively by catheterization. Aortic valve area was calculated at 0.52 cm. Pulmonary artery pressures were normal and there was no significant coronary artery disease. The right coronary artery was never directly engaged during catheterization because of unusual angulation from the aortic root, but the vessel did not appear to have significant coronary artery disease. A cardiac gated MRI was performed 08/27/2014 that revealed a trileaflet aortic valve that was abnormal in appearance with significant restriction to leaflet mobility. There was also concern about possible residual membrane or web in the aortic root consistent with residual supravalvular aortic stenosis. The patient was referred for surgical consultation.  The patient is married and lives with her husband in [place]. 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 This does not require any sort of strenuous physical activity. She has been physically active and functional independent all of her life up until recently. The patient  states that she has gone down steadily over the past 6 months with  progressive symptoms of exertional shortness of breath and chest discomfort. She states that she initially noted that she would get short of breath and fatigued with more strenuous activity such as walking at a brisk pain or going up a flight of stairs. Symptoms have progressed substantially such that the patient now gets short of breath with moderate activity, and this has caused her to have to cut back her daily activities significantly. 3 or 4 months ago she began to experience associated symptoms of exertional chest pain which she describes as a sharp achy pain that radiates across the left chest. Symptoms are always associated with stress or physical exertion and shortness of breath. Symptoms are usually relieved fairly quickly with resting, although she states that last week she had a prolonged episode of shortness of breath and chest discomfort after she had been vacuuming in her house. She has had some occasional mild dizzy spells without syncope. She denies any history of resting shortness of breath, PND, orthopnea. She had some mild lower extremity edema that has resolved since her blood pressure medication was changed recently.  Patient returns for follow-up of stage D severe symptomatic aortic stenosis with history of previous patch repair of supravalvular aortic stenosis in the remote past. She was originally seen in consultation on 09/03/2014. Since then she underwent cardiac gated CT angiogram of the heart to further characterize the anatomy of the aortic root including the origin of the left main and right coronary arteries. She returns to the office today to discuss the results of this test and potentially make plans for surgery. Over the past 2 weeks she was seen transiently in the emergency department on 09/09/2014 with an acute exacerbation of chest tightness and lightheadedness. She was evaluated briefly in the emergency department and discharged home without any complicating events. She  otherwise reports no new problems or complaints. She specifically denies any fevers chills or productive cough. She denies any prolonged episodes of chest pain or chest tightness on relieved by rest. She has apparently been under increased stress at work and she admits that she has also been exerting herself physically more strenuously then she probably should be. She has not had any severe dizzy spells nor syncope. She denies resting shortness of breath.           Past Medical History  Diagnosis Date  . Hypertension   . Hyperlipidemia   . GERD (gastroesophageal reflux disease)   . DVT (deep venous thrombosis)   . Insomnia, unspecified   . Muscle weakness (generalized)   . Peripheral artery disease   . Aortic stenosis, severe 07/20/2014  . Aortic regurgitation 07/20/2014  . Supravalvular aortic stenosis, congenital - s/p repair during childhood     Past Surgical History  Procedure Laterality Date  . Iliac artery stent Left 12/2007  . Cardiac valve surgery  1968  . Cholecystectomy    . Cervical fusion    . Rotator cuff repair Bilateral   . Abdominal aortagram  06/24/12  . Abdominal aortagram N/A 06/24/2012    Procedure: ABDOMINAL Maxcine Ham;  Surgeon: Angelia Mould, MD;  Location: [facility] CATH LAB;  Service: Cardiovascular;  Laterality: N/A;  . Tee without cardioversion N/A 07/07/2014    Procedure: TRANSESOPHAGEAL ECHOCARDIOGRAM (TEE);  Surgeon: Arnoldo Lenis, MD;  Location: [facility] ENDO SUITE;  Service: Cardiology;  Laterality: N/A;  . Tee without cardioversion N/A 07/20/2014    Procedure: TRANSESOPHAGEAL ECHOCARDIOGRAM (  TEE) WITH PROPOFOL;  Surgeon: Arnoldo Lenis, MD;  Location: [facility] ORS;  Service: Endoscopy;  Laterality: N/A;  . Left and right heart catheterization with coronary angiogram N/A 07/31/2014    Procedure: LEFT AND RIGHT HEART CATHETERIZATION WITH CORONARY ANGIOGRAM;  Surgeon: Burnell Blanks, MD;  Location: [facility] CATH LAB;  Service: Cardiovascular;  Laterality: N/A;      Family History  Problem Relation Age of Onset  . Hypertension Mother   . Hypertension Father   . Heart disease Father     before age 48  . Other Father     varicose veins    Social History History  Substance Use Topics  . Smoking status: Current Some Day Smoker -- 0.75 packs/day    Types: Cigarettes    Last Attempt to Quit: 05/25/2009  . Smokeless tobacco: Never Used     Comment: started back about 6 months ago   . Alcohol Use: 2.4 oz/week    4 Glasses of wine per week     Comment: 4 drinks per week    Prior to Admission medications   Medication Sig Start Date End Date Taking? Authorizing Provider  aspirin 81 MG tablet Take 81 mg by mouth at bedtime.     Historical Provider, MD  calcium-vitamin D (OSCAL WITH D) 500-200 MG-UNIT per tablet Take 1 tablet by mouth at bedtime.    Historical Provider, MD  clonazePAM (KLONOPIN) 0.5 MG tablet Take 1 tablet (0.5 mg total) by mouth at bedtime. 09/26/12   Marcial Pacas, MD  CRANBERRY PO Take 1 tablet by mouth daily.    Historical Provider, MD  cyclobenzaprine (FLEXERIL) 10 MG tablet Take 10 mg by mouth daily as needed for muscle spasms.     Historical Provider, MD  estradiol (ESTRACE) 1 MG tablet Take 1 mg by mouth at bedtime.    Historical Provider, MD  Flaxseed, Linseed, (FLAXSEED OIL PO) Take 1 tablet by mouth at bedtime.     Historical Provider, MD  ibuprofen (ADVIL,MOTRIN) 200 MG tablet Take 400 mg by mouth 4 (four) times daily as needed (pain).     Historical Provider, MD  lisinopril (PRINIVIL,ZESTRIL) 20 MG tablet Take 20 mg by mouth daily.    Historical Provider, MD  MAGNESIUM PO Take 1 tablet by mouth daily.    Historical Provider, MD  medroxyPROGESTERone (PROVERA) 2.5 MG tablet Take 2.5 mg by mouth at bedtime.    Historical Provider, MD  Multiple Vitamins-Minerals (MULTIVITAMIN WITH MINERALS) tablet Take 1 tablet by mouth at bedtime.     Historical Provider, MD  POTASSIUM PO Take 1 capsule by mouth daily.    Historical Provider,  MD  RABEprazole (ACIPHEX) 20 MG tablet Take 1 tablet by mouth at bedtime.  06/08/12   Historical Provider, MD  zolpidem (AMBIEN) 10 MG tablet Take 5 mg by mouth at bedtime as needed for sleep. Takes half 07/05/12   Historical Provider, MD    Allergies  Allergen Reactions  . Penicillins Hives    Have taken Keflex before without any adverse reaction.   . Sulfa Antibiotics     Unknown reaction     Review of Systems:  General:decreased appetite, decreased energy, no weight gain, 10 lb weight loss, no fever Cardiac:+ chest pain with exertion, no chest pain at rest, + SOB with exertion, no resting SOB, no PND, no orthopnea, no palpitations, no arrhythmia, no atrial fibrillation, + LE edema, + dizzy spells, no syncope Respiratory:+ shortness of breath, no home oxygen, intermittent productive cough,  no dry cough, no bronchitis, no wheezing, no hemoptysis, no asthma, no pain with inspiration or cough, no sleep apnea, no CPAP at night GI:no difficulty swallowing, no reflux, no frequent heartburn, no hiatal hernia, no abdominal pain, no constipation, no diarrhea, no hematochezia, no hematemesis, no melena GU:no dysuria, no frequency, no urinary tract infection, no hematuria, no kidney stones, no kidney disease Vascular:no pain suggestive of claudication, no pain in feet, + leg cramps, no varicose veins, no DVT, no non-healing foot ulcer Neuro:no stroke, no TIA's, no seizures, no headaches, no temporary blindness one eye, no slurred speech, no peripheral neuropathy, no chronic pain, no instability of gait, no memory/cognitive dysfunction Musculoskeletal:no arthritis, no joint swelling, no myalgias, no difficulty walking, normal mobility   Skin:no rash, no itching, no skin infections, no pressure sores or ulcerations Psych:no anxiety, no depression, no nervousness, + unusual recent stress Eyes:no blurry vision, no floaters, no recent vision changes, + wears glasses or contacts ENT:no hearing loss, no loose or painful teeth, no dentures, last saw dentist last month Hematologic:no easy bruising, no abnormal bleeding, no clotting disorder, no frequent epistaxis Endocrine:no diabetes, does not check CBG's at home   Physical Exam:  BP 111/64 mmHg  Pulse 81  Resp 16  Ht 5' 6.5" (1.689 m)  Wt 196 lb (88.905 kg)  BMI 31.16 kg/m2  SpO2 98% General:mildy obese but o/w well-appearing HEENT:Unremarkable  Neck:no JVD, no bruits, no adenopathy  Chest:clear to auscultation, symmetrical breath sounds, no wheezes, no rhonchi  CV:RRR, grade IV/VI crescendo/decrescendo systolic murmur best RUSB, no diastolic murmur Abdomen:soft, non-tender, no masses  Extremities:warm, well-perfused, pulses diminished but palpable, no LE edema Rectal/GUDeferred Neuro:Grossly non-focal and symmetrical throughout Skin:Clean and dry, no rashes, no breakdown     Diagnostic Tests:  Transthoracic Echocardiography  Patient:  Cylee, Dattilo MR #:    [id number] Study Date: 06/10/2014 Gender:   F Age:    27 Height:   167.6 cm Weight:   91.2  kg BSA:    2.09 m^2 Pt. Status: Room:  SONOGRAPHER [facility] ATTENDING  Kerry Hough, M.D. Berna Spare, M.D. REFERRING  Kerry Hough, M.D. PERFORMING  [facility], [place]  cc:  ------------------------------------------------------------------- LV EF: 55% -  60%  ------------------------------------------------------------------- History:  PMH:  Chest pain. Palpitations and murmur. Coronary artery disease. Aortic valve disease. Risk factors: Current tobacco use. Hypertension. Dyslipidemia.  ------------------------------------------------------------------- Study Conclusions  - Left ventricle: The cavity size was normal. Wall thickness was increased in a pattern of mild LVH. Systolic function was normal. The estimated ejection fraction was in the range of 55% to 60%. Wall motion was normal; there were no regional wall motion abnormalities. - Aortic valve: There is thickening of the aortic valve leaflets. I can not be sure of the anatomy of the aortic valve. There is mild AI. There is a very high velocity jet in the area of the valve.. I suspect that it is not from the valve itself. - Aorta: Doppler in the ascending aorta is very abnormal. There is a high velocity jet greater than 23m/sec in the ascendding aorta. There is history of supra-aortic stenosis that was treated surgically (with a patch?) at age 46 in 43. I suspect that there is now some type of recurrent severe stenosis above the valve. The valve itselt is abnormal, but I suspect the stenosis is above the valve. Patient will need TEE or CT or MRI to furhter assess this anatomy. Prior echo is not available for my review. - Mitral valve:  Mildly calcified annulus. - Left atrium: The atrium was mildly dilated. - Right ventricle: The cavity size was normal. Systolic function was mildly reduced. - Right atrium: The atrium was mildly  dilated.  ------------------------------------------------------------------- Labs, prior tests, procedures, and surgery: Echocardiography (February 2011).  The aortic valve showed mild regurgitation. Aortic valve: peak gradient of 63 mm Hg.  Valve surgery (1968).   Aortic valve repair. Supravalvular aortic stenosis corrective surgery with patching. Transthoracic echocardiography. M-mode, complete 2D, spectral Doppler, and color Doppler. Birthdate: Patient birthdate: 1956/02/08. Age: Patient is 59 yr old. Sex: Gender: female. BMI: 32.4 kg/m^2. Blood pressure:   101/69 Patient status: Outpatient. Study date: Study date: 06/10/2014. Study time: 08:09 AM.  -------------------------------------------------------------------  ------------------------------------------------------------------- Left ventricle: The cavity size was normal. Wall thickness was increased in a pattern of mild LVH. Systolic function was normal. The estimated ejection fraction was in the range of 55% to 60%. Wall motion was normal; there were no regional wall motion abnormalities.  ------------------------------------------------------------------- Aortic valve: There is thickening of the aortic valve leaflets. I can not be sure of the anatomy of the aortic valve. There is mild AI. There is a very high velocity jet in the area of the valve.. I suspect that it is not from the valve itself. Doppler:   Valve area (VTI): 0.44 cm^2. Indexed valve area (VTI): 0.21 cm^2/m^2. Valve area (Vmax): 0.5 cm^2. Indexed valve area (Vmax): 0.24 cm^2/m^2.  Peak gradient (S): 137 mm Hg.  ------------------------------------------------------------------- Aorta: Doppler in the ascending aorta is very abnormal. There is a high velocity jet greater than 65m/sec in the ascendding aorta. There is history of supra-aortic stenosis that was treated surgically (with a patch?) at age 18 in 55. I suspect that  there is now some type of recurrent severe stenosis above the valve. The valve itselt is abnormal, but I suspect the stenosis is above the valve. Patient will need TEE or CT or MRI to furhter assess this anatomy.  ------------------------------------------------------------------- Mitral valve:  Mildly calcified annulus. Doppler: There was no significant regurgitation.  Peak gradient (D): 3 mm Hg.  ------------------------------------------------------------------- Left atrium: The atrium was mildly dilated.  ------------------------------------------------------------------- Right ventricle: The cavity size was normal. Systolic function was mildly reduced.  ------------------------------------------------------------------- Pulmonic valve:  The valve appears to be grossly normal. Doppler: There was no significant regurgitation.  ------------------------------------------------------------------- Tricuspid valve:  Structurally normal valve.  Leaflet separation was normal. Doppler: Transvalvular velocity was within the normal range. There was no regurgitation.  ------------------------------------------------------------------- Right atrium: The atrium was mildly dilated.  ------------------------------------------------------------------- Pericardium: There was no pericardial effusion.  ------------------------------------------------------------------- Post procedure conclusions Ascending Aorta:  - Doppler in the ascending aorta is very abnormal. There is a high velocity jet greater than 53m/sec in the ascendding aorta. There is history of supra-aortic stenosis that was treated surgically (with a patch?) at age 60 in 44. I suspect that there is now some type of recurrent severe stenosis above the valve. The valve itselt is abnormal, but I suspect the stenosis is above the valve. Patient will need TEE or CT or MRI to furhter assess  this anatomy.  ------------------------------------------------------------------- Measurements  Left ventricle              Value     Reference LV ID, ED, PLAX chordal          49  mm    43 - 52 LV ID, ES, PLAX chordal          37  mm    23 - 38  LV fx shortening, PLAX chordal  (L)   24  %    >=29 LV PW thickness, ED            16.4 mm    --------- IVS/LV PW ratio, ED            0.58      <=1.3 Stroke volume, 2D             66  ml    --------- Stroke volume/bsa, 2D           32  ml/m^2  --------- LV ejection fraction, 1-p A4C       53  %    --------- LV end-diastolic volume, 2-p       110  ml    --------- LV end-systolic volume, 2-p        50  ml    --------- LV ejection fraction, 2-p         54  %    --------- Stroke volume, 2-p            60  ml    --------- LV end-diastolic volume/bsa, 2-p     53  ml/m^2  --------- LV end-systolic volume/bsa, 2-p      24  ml/m^2  --------- Stroke volume/bsa, 2-p          28.5 ml/m^2  --------- LV e&', lateral              7.93 cm/s   --------- LV E/e&', lateral             11.73     --------- LV e&', medial               7.16 cm/s   --------- LV E/e&', medial              12.99     --------- LV e&', average              7.55 cm/s   --------- LV E/e&', average             12.33     ---------  Ventricular septum            Value     Reference IVS thickness, ED             9.56 mm    ---------  LVOT                   Value     Reference LVOT ID, S                20  mm    --------- LVOT area                 3.14  cm^2   --------- LVOT ID                  20  mm    --------- LVOT peak velocity, S           92.3 cm/s   --------- LVOT mean velocity, S           64.5 cm/s   --------- LVOT VTI, S                20.9 cm    --------- Stroke volume (SV), LVOT DP        65.7 ml    --------- Stroke index (SV/bsa), LVOT DP      31.4 ml/m^2  ---------  Aortic valve               Value     Reference Aortic peak gradient, S          137  mm Hg  --------- Aortic valve area, VTI          0.44 cm^2   --------- Aortic valve area/bsa, VTI        0.21 cm^2/m^2 --------- Aortic valve area, peak velocity     0.5  cm^2   --------- Aortic valve area/bsa, peak        0.24 cm^2/m^2 --------- velocity Aortic regurg pressure half-time     547  ms    ---------  Aorta                   Value     Reference Aortic root ID, ED            35  mm    ---------  Left atrium                Value     Reference LA ID, A-P, ES              39  mm    --------- LA ID/bsa, A-P              1.86 cm/m^2  <=2.2 LA volume, S               82.6 ml    --------- LA volume/bsa, S             39.5 ml/m^2  --------- LA volume, ES, 1-p A4C          86  ml    --------- LA volume/bsa, ES, 1-p A4C        41.1 ml/m^2  --------- LA volume, ES, 1-p A2C          74.1 ml    --------- LA volume/bsa, ES, 1-p A2C        35.4 ml/m^2  ---------  Mitral valve               Value     Reference Mitral E-wave peak velocity        93  cm/s   --------- Mitral A-wave peak velocity        90.6 cm/s   --------- Mitral deceleration time         190   ms    150 - 230 Mitral peak gradient, D          3   mm Hg  --------- Mitral E/A ratio, peak          1       ---------  Systemic veins              Value     Reference Estimated CVP               3   mm Hg  ---------  Right ventricle              Value     Reference RV s&', lateral, S             8.8  cm/s   ---------  Legend: (L) and (H) mark values outside specified reference range.  ------------------------------------------------------------------- Prepared and Electronically Authenticated by  Dola Argyle, MD 2016-02-18T11:06:39    Transesophageal Echocardiography  Patient:  Niamya, Vittitow MR #:    [id number] Study Date: 07/20/2014 Gender:  F Age:    66 Height:   167.6 cm Weight:   91.4 kg BSA:    2.1 m^2 Pt. Status: Room:    APPO  ATTENDING  Kate Sable, MD ADMITTING  Kerry Hough, M.D. Berna Spare, M.D. REFERRING  Kerry Hough, M.D. PERFORMING  [facility], [place] SONOGRAPHER Titus Mould, RCS  cc:  ------------------------------------------------------------------- LV EF: 60% -  65%  ------------------------------------------------------------------- Indications:   Aortic stenosis 424.1.  ------------------------------------------------------------------- Study Conclusions  - Left ventricle: The cavity size was normal. Wall thickness was normal. Systolic function was normal. The estimated ejection fraction was in the range of 60% to 65%. Wall motion was normal; there were no regional wall motion abnormalities. Doppler parameters are consistent with abnormal left ventricular relaxation (grade 1 diastolic dysfunction). - Aortic valve: Mildly calcified annulus. Trileaflet; moderately thickened leaflets. There appears to be partial prolapse of the aortic  leaflets. There was severe stenosis. There was mild to moderate regurgitation. The AI VC is 0.3 cm. Peak velocity (S): 550.85 cm/s. Mean gradient (S): 64 mm Hg. Valve area (VTI): 0.75 cm^2. Valve area (Vmax): 0.66 cm^2. AVA by planimetry 0.9 cm squared. - Aortic root: The aortic root was normal in size. No evidence of supravalvular obstruction from available views. - Left atrium: No evidence of thrombus in the atrial cavity or appendage. No evidence of thrombus in the appendage. - Right atrium: No evidence of thrombus in the atrial cavity or appendage. - Atrial septum: No defect or patent foramen ovale was identified.  Impressions:  - Severe aortic stenosis. Patient with history of supravalvular aortic stenosis with patch repair roughly 50 years ago. Several views of the aortic root and proximal ascending aorta taken without evidence of supravalvular obstruction, appears to be valvular stenosis.  Diagnostic transesophageal echocardiography. 2D and color Doppler. Birthdate: Patient birthdate: 02/02/1956. Age: Patient is 59 yr old. Sex: Gender: female.  BMI: 32.5 kg/m^2. Blood pressure: 128/86 Patient status: Outpatient. Study date: Study date: 07/20/2014. Study time: 09:15 AM. Location: PACU.  -------------------------------------------------------------------  ------------------------------------------------------------------- Left ventricle: The cavity size was normal. Wall thickness was normal. Systolic function was normal. The estimated ejection fraction was in the range of 60% to 65%. Wall motion was normal; there were no regional wall motion abnormalities. Doppler parameters are consistent with abnormal left ventricular relaxation (grade 1 diastolic dysfunction).  ------------------------------------------------------------------- Aortic valve:  Mildly calcified annulus. Trileaflet; moderately thickened leaflets. There appears to  be partial prolapse of the aortic leaflets. AVA by planimetry 0.9 cm squared. Doppler: There was severe stenosis.  There was mild to moderate regurgitation. The AI VC is 0.3 cm.  VTI ratio of LVOT to aortic valve: 0.24. Valve area (VTI): 0.75 cm^2. Indexed valve area (VTI): 0.36 cm^2/m^2. Valve area (Vmax): 0.66 cm^2. Indexed valve area (Vmax): 0.31 cm^2/m^2.  Mean gradient (S): 64 mm Hg. Peak gradient (S): 121 mm Hg.  ------------------------------------------------------------------- Aorta: Aortic root: The aortic root was normal in size. Sinotubular junction 24 mm. Proximal ascending aorta 24 mm. The visualized portions of the descending aorta are normal. No evidence of supravalvular obstruction from available views.  ------------------------------------------------------------------- Mitral valve:  Normal thickness leaflets . Doppler:  There was no evidence for stenosis.  There was no significant regurgitation.  Mean gradient (D): 1 mm Hg.  ------------------------------------------------------------------- Left atrium: The atrium was normal in size. No evidence of thrombus in the atrial cavity or appendage. No evidence of thrombus in the appendage. The appendage was of normal size. Emptying velocity was normal at 74 cm/s.  -------------------------------------------------------------------  Atrial septum: No defect or patent foramen ovale was identified.  ------------------------------------------------------------------- Right ventricle: The cavity size was normal. Wall thickness was normal. Systolic function was normal.  ------------------------------------------------------------------- Pulmonic valve:  Not well visualized. Doppler:  There was no evidence for stenosis.  There was no significant regurgitation.  ------------------------------------------------------------------- Tricuspid valve:  Normal thickness leaflets. Doppler:  There was no  evidence for stenosis.  There was trivial regurgitation.  ------------------------------------------------------------------- Right atrium: There is a prominent eustachian valve noted. The atrium was normal in size. No evidence of thrombus in the atrial cavity or appendage.  ------------------------------------------------------------------- Pericardium: There was no pericardial effusion.  ------------------------------------------------------------------- Measurements  LVOT                   Value LVOT ID, S                20   mm LVOT area                 3.14  cm^2 LVOT peak velocity, S           115.7 cm/s LVOT VTI, S                31.43 cm LVOT peak gradient, S           5   mm Hg  Aortic valve               Value Aortic valve peak velocity, S       550.85 cm/s Aortic valve mean velocity, S       373.79 cm/s Aortic valve VTI, S            131.61 cm Aortic mean gradient, S          64   mm Hg Aortic peak gradient, S          121  mm Hg VTI ratio, LVOT/AV            0.24 Aortic valve area, VTI          0.75  cm^2 Aortic valve area/bsa, VTI        0.36  cm^2/m^2 Aortic valve area, peak velocity     0.66  cm^2 Aortic valve area/bsa, peak velocity   0.31  cm^2/m^2  Aorta                   Value Aortic root ID              27   mm  Mitral valve               Value Mitral mean velocity, D          52.08 cm/s Mitral mean gradient, D          1   mm Hg  Legend: (L) and (H) mark values outside specified reference range.  ------------------------------------------------------------------- Prepared and Electronically Authenticated by  Kerry Hough,  M.D. 2016-03-28T13:06:15    Cardiac Catheterization Operative Report  Lonia Mad [id number] 4/8/20168:54 AM Octavio Graves, DO  Procedure Performed:  1. Left Heart Catheterization 2. Selective Coronary Angiography 3. Right Heart Catheterization 4. 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 She has been seen by Dr. Harl Bowie and had a TEE suggesting severe valvular AS. She is referred for right and left heart cath today.  Procedure Details: The risks, benefits, complications, treatment options, and expected outcomes were discussed with the patient. The patient and/or family concurred with the proposed plan, giving informed consent. The patient was brought to the cath lab after IV hydration was begun and oral premedication was given. The patient was further sedated with Versed and Fentanyl. The right antecubital area had an IV present. This area was prepped and draped. A 5 French sheath was placed in the right antecubital vein. A balloon tipped catheter was used to perform a right heart catheterization. The right wrist was prepped and draped in a sterile fashion. 1% lidocaine used for local anesthesia. I then placed a 5 French sheath in the right radial artery. The left main was engaged with a JL3.5 catheter. The RCA was difficult to engage. I was able to get an image of the RCA with the [facility] catheter. A pigtail catheter was used to perform a left ventricular angiogram and an aortic root angiogram. There were no immediate complications. The patient was taken to the recovery area in stable condition.   Hemodynamic Findings: Ao: 107/55  LV: 190/12/15 RA: 1  RV: 41/2/6 PA: 26/6 (mean 15)  PCWP: 7 Fick Cardiac Output: 4.76 L/min Fick Cardiac Index: 2.38 L/min/m2 Central Aortic  Saturation: 96% Pulmonary Artery Saturation: 66%  Aortic valve data:  Peak to peak gradient 83 mm Hg Mean gradient 56 mmHg AVA 0.52 cm2  Angiographic Findings:  Left main: Normal caliber vessel with no obstructive disease.   Left Anterior Descending Artery: Large caliber vessel that courses to the apex. Moderate caliber diagonal branch. No obstructive disease.   Circumflex Artery: Large caliber vessel with moderate caliber obtuse marginal branch. No obstructive disease.   Right Coronary Artery: Large dominant vessel with no obstructive disease.   Left Ventricular Angiogram: LVEF=60%  Aortic root angiogram: The aortic root is not enlarged. There is supravalvular density.   Aortic valve calcification noted on plain fluoroscopy.   Impression: 1. No angiographic evidence of CAD 2. Normal LV systolic function 3. Severe gradient across the aortic valve into the aorta with density noted in the aortic root. Cannot exclude recurrent supravalvular stenosis along with aortic valve stenosis. 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  4. Normal filling pressures  Recommendations: I have reviewed the case in detail this am with my imaging partner. The TEE is also reviewed. It is difficult to tell if this is primarily aortic valve stenosis or if there is also involvement of the aortic root which is also clearly abnormal post repair as a child. Will need cardiac MRI/MRA of the thoracic aorta to better define.  Will allow her to go home today after bedrest.    Complications: None; patient tolerated the procedure well.       CARDIAC MRI  TECHNIQUE: The patient was scanned on a 1.5 Tesla GE magnet. A dedicated cardiac coil was used. Functional imaging was done using Fiesta sequences. 2,3, and 4 chamber views were done to assess for RWMA's. Modified Simpson's rule using a short axis stack was used to calculate an ejection  fraction on a dedicated work Conservation officer, nature. The patient received 30 cc of Multihance. MR angiography was done. After 10 minutes inversion recovery sequences were used to assess for infiltration and scar tissue.  CONTRAST: 30 cc Multihance  FINDINGS: No gross abnormalities on limited images of the lung fields.  The left ventricle was normal in size with mild to moderate LV hypertrophy (concentric). EF  59% with normal wall motion. Normal right ventricular size and systolic function. Normal atrial sizes. No significant mitral regurgitation. The aortic valve was trileaflet but misshapen and thickened. One leaflet appears to have some prolapse. There was restriction to aortic valve opening with a degree of stenosis. Just superior to the aortic valve, I am concerned about possible residual membrane or web creating additional obstruction. This is difficult to visualize.  On delayed enhancement imaging, there was no myocardial late gadolinium enhancement.  On MR angiography, the thoracic aorta was normal in caliber with normal arch vessel origins. The pulmonary veins drain normally to the left atrium.  MEASUREMENTS: LV EDV 185 cc  LV SV 110 cc  LV EF 59%  IMPRESSION: 1. Normal LV size with mild to moderate LV hypertrophy, EF 59%.  2. Normal RV size and systolic function.  3. Misshapen and thickened but trileaflet aortic valve. One leaflet has some degree of prolapse. There was restriction to aortic valve opening with a degree of stenosis. Just superior to the aortic valve, I am concerned about possible residual membrane or web creating additional obstruction. This is difficult to visualize.  Dalton Mclean   Electronically Signed  By: Loralie Champagne M.D.  On: 08/27/2014 19:26   CARDIAC GATED CT ANGIOGRAM HEART  CLINICAL DATA: 59 year old female with h/o congenital supravalvular aortic stenosis repaired at age of 63 with a Teflon  patch repair of her aorta, her aortic valve was not repaired. Echocardiogram showed elevated gradients across the aortic valve consistent with severe aortic stenosis and no definite sign of residual supravalvular obstruction. Cardiac MRI shows possible residual membrane or web in the aortic root suspicious for residual supravalvular aortic stenosis.  TECHNIQUE: The patient was scanned on a Philips 256 scanner. A 120 kV retrospective scan was triggered in the descending thoracic aorta at 111 HU's. Gantry rotation speed was 270 msecs and collimation was .9 mm. 2.5 of iv Metoprolol and no nitro were given. The 3D data set was reconstructed in 5% intervals of the R-R cycle. Systolic and diastolic phases were analyzed on a dedicated work station using MPR, MIP and VRT modes. The patient received 80 cc of contrast.  FINDINGS: Aortic Valve: Trileaflet, severely thickened, with moderate calcifications predominantly of the non-coronary and left leaflet, no subvalvular calcifications. There is severely restricted leaflet opening in systole.  There is no membrane seen above the aortic valve and no re-stenosis is seen above the valve. A surgical patch is seen from the ascending aorta (at the level of pulmonary artery) extending into the coronary sinuses.  There is an unusual appearance of the right and left coronary cusps, they both appears narrowed with reduced communication between the cusp and aorta itself. Right cusp has very limited communication with the aorta with a small orifice measuring 3.5 x 3.5 mm. This narrowing is most probably a result of patch/scar strictures. This is possibly limiting blood flow into the RCA.  Aorta: Normal caliber, minimal calcifications, no coarctation or dissection.  Sinotubular Junction (repaired): 22 x 21 mm  Ascending Thoracic Aorta: 25 x 25 mm  Aortic Arch: 22 x 21 mm  Descending Thoracic Aorta: 20 x 20 mm  Sinus of Valsalva  Measurements:  Non-coronary: 25 mm  Right -coronary: 24 mm  Left -coronary: 26 mm  Coronary Arteries: Normal origin. Right dominance. Minimal non-obstructive CAD.  Other findings:  Normal pulmonary vein drainage (2 on the right and 2 on the left).  No other congenital anomalies were found such as ASD, VSD or  aortic coarctation.  There are no signs of prior sternotomy, surgical wires are seen. There is a good distance between anterior portions of the heart and no adhesions to the sternum are seen.  IMPRESSION: 1. Trileaflet, severely thickened, moderately calcified aortic valve with significantly limited leaflet opening. No supravalvular membrane of supravalvular re-stenosis is seen.  2. Right and left coronary cusp are narrowed with limited communication between the cusps and aorta itself with possible limited blood supply to RCA. This narrowing is most probably a result of patch/scar strictures.  3. Normal coronary origin, right dominance, minimal non-obstructive CAD.  4. No other congenital abnormalities were identified.  [name]   Electronically Signed  By: [name]  On: 09/17/2014 14:30      Addendum by Provider Default, MD on Fri Sep 18, 2014 7:09 PM    ADDENDUM REPORT: 09/18/2014 19:06  ADDENDUM: Coronary Artery Height above Annulus:  Left Main: 9 mm  Right Coronary: 15 mm  Virtual Basal Annulus Measurements:  Maximum/Minimum Diameter: 26 x 20 mm  Perimeter: 89 mm  Area: 411 mm2  [name]   Electronically Signed  By: [name]  On: 09/18/2014 19:06    OVER-READ INTERPRETATION CT CHEST  The following report is an over-read performed by radiologist Dr. Norlene Duel [facility] on 09/16/2014. This over-read does not include interpretation of cardiac or coronary anatomy or pathology. The coronary calcium score/coronary CTA interpretation by  the cardiologist is attached.  COMPARISON: None.  FINDINGS: Mediastinum: The trachea is patent and appears midline. The visualized portions of the esophagus are unremarkable. There is a a right paratracheal lymph node which measures 9 mm, image 20 of series 514.  Lungs/Pleura: There is no pleural effusion identified. Dependent changes are noted within the posterior lung bases. Scar versus platelike atelectasis noted within the lingula. Within the left upper lobe there is a 4 mm nodule, image number 6 of series 512. 6 mm nodule within the right upper lobe is identified, image number 2 of series 512.  Upper Abdomen: There is no suspicious liver abnormality. The visualized portions of the spleen are normal.  Musculoskeletal: Mild spondylosis noted within the thoracic spine.  IMPRESSION: 1. No acute pulmonary abnormalities noted. 2. Small nodules are identified in the upper lobes. The largest measures 6 mm. If the patient is at high risk for bronchogenic carcinoma, follow-up chest CT at 6-12 months is recommended. If the patient is at low risk for bronchogenic carcinoma, follow-up chest CT at 12 months is recommended. This recommendation follows the consensus statement: Guidelines for Management of Small Pulmonary Nodules Detected on CT Scans: A Statement from the [place] as published in Radiology 2005;237:395-400.  Electronically Signed: By: Kerby Moors M.D. On: 09/16/2014 10:16         Impression:  Patient has stage D severe symptomatic aortic stenosis with normal left ventricular systolic function. She reportedly underwent patch repair of her aortic root for supravalvular aortic stenosis at age 13. I have personally reviewed the patient's recent echocardiograms, catheterization, cardiac MRI and cardiac-gated CT angiogram of the heart. The patient's aortic valve is tricuspid but all 3 leaflets are abnormal with significant thickening and restricted  leaflet mobility. There is mild prolapse of one of the leaflets. There is severe aortic stenosis with peak velocity across the aortic valve measured greater than 5 m/s. There is mild to moderate aortic insufficiency. I do not think there is significant residual supravalvular aortic stenosis. The original patch repair appears to be a single patch  repair involving only the non-coronary sinus of Valsalva. 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 This angulation could be associated with some hemodynamic compromise of flow into the right coronary artery, although this is probably unlikely. I agree that the patient needs aortic valve replacement, and under the circumstances she probably will need aortic root replacement. Transcatheter aortic valve replacement might technically be feasible and associated with considerably less operative risk, but long-term durability might be very problematic in this relatively young patient.    Plan:  The patient and her husband were again counseled at length regarding treatment alternatives for management of severe aortic stenosis including continued medical therapy versus proceeding with aortic valve replacement in the near future. The natural history of aortic stenosis was reviewed, as was long term prognosis with medical therapy alone. Surgical options were discussed at length including conventional surgical aortic valve or root replacement using either a mechanical prosthesis or a bioprosthetic tissue valve. Other alternatives including human allograft (homograft) aortic root replacement, stentless porcine aortic root replacement, and transcatheter aortic valve replacement were discussed. Discussion was held comparing the relative risks of mechanical valve  replacement with need for lifelong anticoagulation versus use of a bioprosthetic tissue valve and the associated potential for late structural valve deterioration and failure. This discussion was placed in the context of the patient's particular circumstances, and as a result the patient specifically requests that their valve be replaced using a tissue valve. The patient understands and accepts all potential associated risks of surgery including but not limited to risk of death, stroke, myocardial infarction, congestive heart failure, respiratory failure, renal failure, pneumonia, bleeding requiring blood transfusion and or reexploration, arrhythmia, heart block or bradycardia requiring permanent pacemaker, aortic dissection or other major vascular complication, pleural effusions or other delayed complications related to continued congestive heart failure, and other late complications related to valve replacement including structural valve deterioration and failure, thrombosis, endocarditis, or paravalvular leak. We plan to proceed with surgery on Wednesday, 10/07/2014. The patient has been instructed to stop smoking completely immediately. She has also been instructed to stop taking aspirin between now and the time of surgery. All of her questions have been addressed.    Valentina Gu. Roxy Manns, MD 09/22/2014 3:22 PM

## 2014-10-05 NOTE — Progress Notes (Signed)
Patient reports that she has been having dizzy spells for approximately 1 week and that when she goes the nurse at work they report that her heart is skipping a beat. Will repeat EKG. Thurmond Butts, RN at [employer]

## 2014-10-05 NOTE — Progress Notes (Signed)
Pre-op Cardiac Surgery  Carotid Findings:  Bilateral:  1-39% ICA stenosis.  Vertebral artery flow is antegrade.      Upper Extremity Right Left  Brachial Pressures 127 138  Radial Waveforms Tri Tri  Ulnar Waveforms Tri Tri  Palmar Arch (Allen's Test) Decreases >50% with radial compression, obliterates with ulnar compression Obliterates with radial compression, normal with ulnar compression     Landry Mellow, RDMS, RVT  10/05/2014

## 2014-10-06 LAB — HEMOGLOBIN A1C
Hgb A1c MFr Bld: 5.5 % (ref 4.8–5.6)
Mean Plasma Glucose: 111 mg/dL

## 2014-10-06 MED ORDER — SODIUM CHLORIDE 0.9 % IV SOLN
INTRAVENOUS | Status: DC
  Filled 2014-10-06: qty 30

## 2014-10-06 MED ORDER — PHENYLEPHRINE HCL 10 MG/ML IJ SOLN
30.0000 ug/min | INTRAVENOUS | Status: AC
  Administered 2014-10-07: 25 ug/min via INTRAVENOUS
  Filled 2014-10-06: qty 2

## 2014-10-06 MED ORDER — AMINOCAPROIC ACID 250 MG/ML IV SOLN
INTRAVENOUS | Status: AC
  Administered 2014-10-07: 68.9 mL/h via INTRAVENOUS
  Filled 2014-10-06: qty 40

## 2014-10-06 MED ORDER — VANCOMYCIN HCL 1000 MG IV SOLR
INTRAVENOUS | Status: AC
  Administered 2014-10-07: 1000 mL
  Filled 2014-10-06: qty 1000

## 2014-10-06 MED ORDER — LEVOFLOXACIN IN D5W 500 MG/100ML IV SOLN
500.0000 mg | INTRAVENOUS | Status: AC
  Administered 2014-10-07: 500 mg via INTRAVENOUS
  Filled 2014-10-06: qty 100

## 2014-10-06 MED ORDER — CHLORHEXIDINE GLUCONATE 4 % EX LIQD: 30.0000 mL | CUTANEOUS | Status: DC

## 2014-10-06 MED ORDER — DOPAMINE-DEXTROSE 3.2-5 MG/ML-% IV SOLN
0.0000 ug/kg/min | INTRAVENOUS | Status: DC
  Filled 2014-10-06: qty 250

## 2014-10-06 MED ORDER — DEXMEDETOMIDINE HCL IN NACL 400 MCG/100ML IV SOLN
0.1000 ug/kg/h | INTRAVENOUS | Status: AC
  Administered 2014-10-07: .2 ug/kg/h via INTRAVENOUS
  Filled 2014-10-06: qty 100

## 2014-10-06 MED ORDER — EPINEPHRINE HCL 1 MG/ML IJ SOLN
0.0000 ug/min | INTRAVENOUS | Status: DC
  Filled 2014-10-06: qty 4

## 2014-10-06 MED ORDER — SODIUM CHLORIDE 0.9 % IV SOLN
INTRAVENOUS | Status: AC
  Administered 2014-10-07: .8 [IU]/h via INTRAVENOUS
  Filled 2014-10-06: qty 2.5

## 2014-10-06 MED ORDER — PLASMA-LYTE 148 IV SOLN
INTRAVENOUS | Status: AC
  Administered 2014-10-07: 500 mL
  Filled 2014-10-06: qty 2.5

## 2014-10-06 MED ORDER — POTASSIUM CHLORIDE 2 MEQ/ML IV SOLN
80.0000 meq | INTRAVENOUS | Status: DC
  Filled 2014-10-06: qty 40

## 2014-10-06 MED ORDER — NITROGLYCERIN IN D5W 200-5 MCG/ML-% IV SOLN
2.0000 ug/min | INTRAVENOUS | Status: AC
  Administered 2014-10-07: 10 ug/min via INTRAVENOUS
  Filled 2014-10-06: qty 250

## 2014-10-06 MED ORDER — METOPROLOL TARTRATE 12.5 MG HALF TABLET
12.5000 mg | ORAL_TABLET | Freq: Once | ORAL | Status: AC
  Administered 2014-10-07: 12.5 mg via ORAL
  Filled 2014-10-06: qty 1

## 2014-10-06 MED ORDER — VANCOMYCIN HCL 10 G IV SOLR
1250.0000 mg | INTRAVENOUS | Status: AC
  Administered 2014-10-07: 1250 mg via INTRAVENOUS
  Filled 2014-10-06: qty 1250

## 2014-10-06 MED ORDER — MAGNESIUM SULFATE 50 % IJ SOLN
40.0000 meq | INTRAMUSCULAR | Status: DC
  Filled 2014-10-06: qty 10

## 2014-10-07 ENCOUNTER — Encounter (HOSPITAL_COMMUNITY)
Admission: RE | Disposition: A | Discharge status: Home / Self Care | Payer: BLUE CROSS/BLUE SHIELD | Source: Ambulatory Visit | Attending: Thoracic Surgery (Cardiothoracic Vascular Surgery)

## 2014-10-07 ENCOUNTER — Inpatient Hospital Stay (HOSPITAL_COMMUNITY): Payer: BLUE CROSS/BLUE SHIELD | Admitting: Vascular Surgery

## 2014-10-07 ENCOUNTER — Inpatient Hospital Stay (HOSPITAL_COMMUNITY): Payer: BLUE CROSS/BLUE SHIELD

## 2014-10-07 ENCOUNTER — Inpatient Hospital Stay (HOSPITAL_COMMUNITY): Payer: BLUE CROSS/BLUE SHIELD | Admitting: Anesthesiology

## 2014-10-07 ENCOUNTER — Encounter (HOSPITAL_COMMUNITY): Payer: Self-pay | Admitting: *Deleted

## 2014-10-07 ENCOUNTER — Inpatient Hospital Stay (HOSPITAL_COMMUNITY)
Admission: RE | Admit: 2014-10-07 | Discharge: 2014-10-12 | DRG: 253 | Disposition: A | Discharge status: Home / Self Care | Payer: BLUE CROSS/BLUE SHIELD | Source: Ambulatory Visit | Attending: Thoracic Surgery (Cardiothoracic Vascular Surgery) | Admitting: Thoracic Surgery (Cardiothoracic Vascular Surgery)

## 2014-10-07 DIAGNOSIS — I739 Peripheral vascular disease, unspecified: Secondary | ICD-10-CM | POA: Diagnosis present

## 2014-10-07 DIAGNOSIS — Z01812 Encounter for preprocedural laboratory examination: Secondary | ICD-10-CM

## 2014-10-07 DIAGNOSIS — E8770 Fluid overload, unspecified: Secondary | ICD-10-CM | POA: Diagnosis not present

## 2014-10-07 DIAGNOSIS — I10 Essential (primary) hypertension: Secondary | ICD-10-CM | POA: Diagnosis present

## 2014-10-07 DIAGNOSIS — I352 Nonrheumatic aortic (valve) stenosis with insufficiency: Secondary | ICD-10-CM | POA: Diagnosis present

## 2014-10-07 DIAGNOSIS — D62 Acute posthemorrhagic anemia: Secondary | ICD-10-CM | POA: Diagnosis not present

## 2014-10-07 DIAGNOSIS — F17210 Nicotine dependence, cigarettes, uncomplicated: Secondary | ICD-10-CM | POA: Diagnosis present

## 2014-10-07 DIAGNOSIS — J9811 Atelectasis: Secondary | ICD-10-CM | POA: Diagnosis not present

## 2014-10-07 DIAGNOSIS — Z881 Allergy status to other antibiotic agents status: Secondary | ICD-10-CM

## 2014-10-07 DIAGNOSIS — I350 Nonrheumatic aortic (valve) stenosis: Secondary | ICD-10-CM

## 2014-10-07 DIAGNOSIS — Z959 Presence of cardiac and vascular implant and graft, unspecified: Secondary | ICD-10-CM | POA: Diagnosis not present

## 2014-10-07 DIAGNOSIS — Z6831 Body mass index (BMI) 31.0-31.9, adult: Secondary | ICD-10-CM | POA: Diagnosis not present

## 2014-10-07 DIAGNOSIS — Z01810 Encounter for preprocedural cardiovascular examination: Secondary | ICD-10-CM

## 2014-10-07 DIAGNOSIS — Z882 Allergy status to sulfonamides status: Secondary | ICD-10-CM | POA: Diagnosis not present

## 2014-10-07 DIAGNOSIS — Q230 Congenital stenosis of aortic valve: Secondary | ICD-10-CM

## 2014-10-07 DIAGNOSIS — I351 Nonrheumatic aortic (valve) insufficiency: Secondary | ICD-10-CM | POA: Diagnosis present

## 2014-10-07 DIAGNOSIS — Z793 Long term (current) use of hormonal contraceptives: Secondary | ICD-10-CM | POA: Diagnosis not present

## 2014-10-07 DIAGNOSIS — E669 Obesity, unspecified: Secondary | ICD-10-CM | POA: Diagnosis present

## 2014-10-07 DIAGNOSIS — K219 Gastro-esophageal reflux disease without esophagitis: Secondary | ICD-10-CM | POA: Diagnosis present

## 2014-10-07 DIAGNOSIS — Z8249 Family history of ischemic heart disease and other diseases of the circulatory system: Secondary | ICD-10-CM | POA: Diagnosis not present

## 2014-10-07 DIAGNOSIS — E785 Hyperlipidemia, unspecified: Secondary | ICD-10-CM | POA: Diagnosis present

## 2014-10-07 DIAGNOSIS — Z7982 Long term (current) use of aspirin: Secondary | ICD-10-CM | POA: Diagnosis not present

## 2014-10-07 DIAGNOSIS — Q254 Other congenital malformations of aorta: Secondary | ICD-10-CM | POA: Diagnosis not present

## 2014-10-07 DIAGNOSIS — G4730 Sleep apnea, unspecified: Secondary | ICD-10-CM | POA: Diagnosis present

## 2014-10-07 DIAGNOSIS — Z954 Presence of other heart-valve replacement: Secondary | ICD-10-CM

## 2014-10-07 HISTORY — DX: Presence of other heart-valve replacement: Z95.4

## 2014-10-07 HISTORY — PX: ASCENDING AORTIC ROOT REPLACEMENT: SHX5729

## 2014-10-07 HISTORY — PX: TEE WITHOUT CARDIOVERSION: SHX5443

## 2014-10-07 HISTORY — PX: AORTIC VALVE REPLACEMENT: SHX41

## 2014-10-07 LAB — POCT I-STAT, CHEM 8
BUN: 15 mg/dL (ref 6–20)
BUN: 14 mg/dL (ref 6–20)
BUN: 12 mg/dL (ref 6–20)
BUN: 13 mg/dL (ref 6–20)
BUN: 15 mg/dL (ref 6–20)
BUN: 16 mg/dL (ref 6–20)
BUN: 18 mg/dL (ref 6–20)
CHLORIDE: 107 mmol/L (ref 101–111)
CHLORIDE: 104 mmol/L (ref 101–111)
CHLORIDE: 102 mmol/L (ref 101–111)
CREATININE: 0.9 mg/dL (ref 0.44–1.00)
CREATININE: 0.9 mg/dL (ref 0.44–1.00)
Calcium, Ion: 1.24 mmol/L — ABNORMAL HIGH (ref 1.12–1.23)
Calcium, Ion: 1.23 mmol/L (ref 1.12–1.23)
Calcium, Ion: 1 mmol/L — ABNORMAL LOW (ref 1.12–1.23)
Calcium, Ion: 1.01 mmol/L — ABNORMAL LOW (ref 1.12–1.23)
Calcium, Ion: 1.04 mmol/L — ABNORMAL LOW (ref 1.12–1.23)
Calcium, Ion: 1.07 mmol/L — ABNORMAL LOW (ref 1.12–1.23)
Calcium, Ion: 1.14 mmol/L (ref 1.12–1.23)
Chloride: 107 mmol/L (ref 101–111)
Chloride: 100 mmol/L — ABNORMAL LOW (ref 101–111)
Chloride: 104 mmol/L (ref 101–111)
Chloride: 107 mmol/L (ref 101–111)
Creatinine, Ser: 0.8 mg/dL (ref 0.44–1.00)
Creatinine, Ser: 0.8 mg/dL (ref 0.44–1.00)
Creatinine, Ser: 0.7 mg/dL (ref 0.44–1.00)
Creatinine, Ser: 0.9 mg/dL (ref 0.44–1.00)
Creatinine, Ser: 1 mg/dL (ref 0.44–1.00)
GLUCOSE: 102 mg/dL — AB (ref 65–99)
GLUCOSE: 89 mg/dL (ref 65–99)
GLUCOSE: 150 mg/dL — AB (ref 65–99)
Glucose, Bld: 107 mg/dL — ABNORMAL HIGH (ref 65–99)
Glucose, Bld: 235 mg/dL — ABNORMAL HIGH (ref 65–99)
Glucose, Bld: 219 mg/dL — ABNORMAL HIGH (ref 65–99)
Glucose, Bld: 232 mg/dL — ABNORMAL HIGH (ref 65–99)
HCT: 37 % (ref 36.0–46.0)
HCT: 29 % — ABNORMAL LOW (ref 36.0–46.0)
HCT: 35 % — ABNORMAL LOW (ref 36.0–46.0)
HEMATOCRIT: 36 % (ref 36.0–46.0)
HEMATOCRIT: 28 % — AB (ref 36.0–46.0)
HEMATOCRIT: 26 % — AB (ref 36.0–46.0)
HEMATOCRIT: 31 % — AB (ref 36.0–46.0)
HEMOGLOBIN: 9.5 g/dL — AB (ref 12.0–15.0)
HEMOGLOBIN: 10.5 g/dL — AB (ref 12.0–15.0)
Hemoglobin: 12.6 g/dL (ref 12.0–15.0)
Hemoglobin: 12.2 g/dL (ref 12.0–15.0)
Hemoglobin: 8.8 g/dL — ABNORMAL LOW (ref 12.0–15.0)
Hemoglobin: 9.9 g/dL — ABNORMAL LOW (ref 12.0–15.0)
Hemoglobin: 11.9 g/dL — ABNORMAL LOW (ref 12.0–15.0)
POTASSIUM: 4.1 mmol/L (ref 3.5–5.1)
POTASSIUM: 4.2 mmol/L (ref 3.5–5.1)
POTASSIUM: 5.5 mmol/L — AB (ref 3.5–5.1)
Potassium: 4.1 mmol/L (ref 3.5–5.1)
Potassium: 4.9 mmol/L (ref 3.5–5.1)
Potassium: 4.1 mmol/L (ref 3.5–5.1)
Potassium: 4 mmol/L (ref 3.5–5.1)
SODIUM: 141 mmol/L (ref 135–145)
SODIUM: 129 mmol/L — AB (ref 135–145)
SODIUM: 133 mmol/L — AB (ref 135–145)
SODIUM: 137 mmol/L (ref 135–145)
Sodium: 140 mmol/L (ref 135–145)
Sodium: 140 mmol/L (ref 135–145)
Sodium: 144 mmol/L (ref 135–145)
TCO2: 23 mmol/L (ref 0–100)
TCO2: 22 mmol/L (ref 0–100)
TCO2: 21 mmol/L (ref 0–100)
TCO2: 20 mmol/L (ref 0–100)
TCO2: 20 mmol/L (ref 0–100)
TCO2: 19 mmol/L (ref 0–100)
TCO2: 20 mmol/L (ref 0–100)

## 2014-10-07 LAB — CBC
HEMATOCRIT: 35.4 % — AB (ref 36.0–46.0)
HEMATOCRIT: 33.1 % — AB (ref 36.0–46.0)
HEMOGLOBIN: 11.5 g/dL — AB (ref 12.0–15.0)
Hemoglobin: 12.2 g/dL (ref 12.0–15.0)
MCH: 31 pg (ref 26.0–34.0)
MCH: 30.7 pg (ref 26.0–34.0)
MCHC: 34.5 g/dL (ref 30.0–36.0)
MCHC: 34.7 g/dL (ref 30.0–36.0)
MCV: 89.8 fL (ref 78.0–100.0)
MCV: 88.5 fL (ref 78.0–100.0)
PLATELETS: 135 10*3/uL — AB (ref 150–400)
Platelets: 106 10*3/uL — ABNORMAL LOW (ref 150–400)
RBC: 3.94 MIL/uL (ref 3.87–5.11)
RBC: 3.74 MIL/uL — ABNORMAL LOW (ref 3.87–5.11)
RDW: 12.2 % (ref 11.5–15.5)
RDW: 12.3 % (ref 11.5–15.5)
WBC: 21.1 10*3/uL — AB (ref 4.0–10.5)
WBC: 15.3 10*3/uL — ABNORMAL HIGH (ref 4.0–10.5)

## 2014-10-07 LAB — POCT I-STAT 3, ART BLOOD GAS (G3+)
ACID-BASE DEFICIT: 5 mmol/L — AB (ref 0.0–2.0)
ACID-BASE DEFICIT: 2 mmol/L (ref 0.0–2.0)
BICARBONATE: 23.9 meq/L (ref 20.0–24.0)
Bicarbonate: 22.4 mEq/L (ref 20.0–24.0)
O2 Saturation: 96 %
O2 Saturation: 97 %
PO2 ART: 96 mmHg (ref 80.0–100.0)
Patient temperature: 36.2
TCO2: 24 mmol/L (ref 0–100)
TCO2: 25 mmol/L (ref 0–100)
pCO2 arterial: 49.9 mmHg — ABNORMAL HIGH (ref 35.0–45.0)
pCO2 arterial: 44.6 mmHg (ref 35.0–45.0)
pH, Arterial: 7.257 — ABNORMAL LOW (ref 7.350–7.450)
pH, Arterial: 7.333 — ABNORMAL LOW (ref 7.350–7.450)
pO2, Arterial: 94 mmHg (ref 80.0–100.0)

## 2014-10-07 LAB — CREATININE, SERUM
CREATININE: 0.92 mg/dL (ref 0.44–1.00)
GFR calc Af Amer: >60 mL/min (ref >60)
GFR calc non Af Amer: >60 mL/min (ref >60)

## 2014-10-07 LAB — PROTIME-INR
INR: 1.65 — AB (ref 0.00–1.49)
Prothrombin Time: 19.5 seconds — ABNORMAL HIGH (ref 11.6–15.2)

## 2014-10-07 LAB — GLUCOSE, CAPILLARY
GLUCOSE-CAPILLARY: 129 mg/dL — AB (ref 65–99)
GLUCOSE-CAPILLARY: 107 mg/dL — AB (ref 65–99)
Glucose-Capillary: 65 mg/dL (ref 65–99)
Glucose-Capillary: 109 mg/dL — ABNORMAL HIGH (ref 65–99)
Glucose-Capillary: 126 mg/dL — ABNORMAL HIGH (ref 65–99)

## 2014-10-07 LAB — POCT I-STAT 4, (NA,K, GLUC, HGB,HCT)
Glucose, Bld: 72 mg/dL (ref 65–99)
HCT: 36 % (ref 36.0–46.0)
HEMOGLOBIN: 12.2 g/dL (ref 12.0–15.0)
Potassium: 3.6 mmol/L (ref 3.5–5.1)
SODIUM: 143 mmol/L (ref 135–145)

## 2014-10-07 LAB — PLATELET COUNT: Platelets: 153 10*3/uL (ref 150–400)

## 2014-10-07 LAB — APTT: APTT: 36 s (ref 24–37)

## 2014-10-07 LAB — HEMOGLOBIN AND HEMATOCRIT, BLOOD
HCT: 29.8 % — ABNORMAL LOW (ref 36.0–46.0)
HEMOGLOBIN: 10.2 g/dL — AB (ref 12.0–15.0)

## 2014-10-07 LAB — MAGNESIUM: MAGNESIUM: 2.9 mg/dL — AB (ref 1.7–2.4)

## 2014-10-07 SURGERY — REDO AORTIC VALVE REPLACEMENT (AVR)
Anesthesia: General | Site: Chest

## 2014-10-07 MED ORDER — PROPOFOL 10 MG/ML IV BOLUS
INTRAVENOUS | Status: AC
  Filled 2014-10-07: qty 20

## 2014-10-07 MED ORDER — EPHEDRINE SULFATE 50 MG/ML IJ SOLN
INTRAMUSCULAR | Status: AC
  Filled 2014-10-07: qty 1

## 2014-10-07 MED ORDER — FENTANYL CITRATE (PF) 250 MCG/5ML IJ SOLN
INTRAMUSCULAR | Status: AC
  Filled 2014-10-07: qty 5

## 2014-10-07 MED ORDER — FAMOTIDINE IN NACL 20-0.9 MG/50ML-% IV SOLN
20.0000 mg | Freq: Two times a day (BID) | INTRAVENOUS | Status: DC
  Administered 2014-10-07: 20 mg via INTRAVENOUS

## 2014-10-07 MED ORDER — ROCURONIUM BROMIDE 50 MG/5ML IV SOLN
INTRAVENOUS | Status: AC
  Filled 2014-10-07: qty 1

## 2014-10-07 MED ORDER — NITROGLYCERIN IN D5W 200-5 MCG/ML-% IV SOLN
0.0000 ug/min | INTRAVENOUS | Status: DC
Start: 2014-10-07 — End: 2014-10-08

## 2014-10-07 MED ORDER — ARTIFICIAL TEARS OP OINT
TOPICAL_OINTMENT | OPHTHALMIC | Status: AC
  Filled 2014-10-07: qty 3.5

## 2014-10-07 MED ORDER — CHLORHEXIDINE GLUCONATE 0.12 % MT SOLN
15.0000 mL | Freq: Two times a day (BID) | OROMUCOSAL | Status: DC
  Administered 2014-10-07 – 2014-10-11 (×6): 15 mL via OROMUCOSAL
  Filled 2014-10-07 (×12): qty 15

## 2014-10-07 MED ORDER — SODIUM CHLORIDE 0.9 % IV SOLN
INTRAVENOUS | Status: DC
  Administered 2014-10-07: 15:00:00 via INTRAVENOUS

## 2014-10-07 MED ORDER — INSULIN REGULAR BOLUS VIA INFUSION
0.0000 [IU] | Freq: Three times a day (TID) | INTRAVENOUS | Status: DC
  Filled 2014-10-07: qty 10

## 2014-10-07 MED ORDER — ALBUMIN HUMAN 5 % IV SOLN
250.0000 mL | INTRAVENOUS | Status: AC | PRN
  Administered 2014-10-07 (×3): 250 mL via INTRAVENOUS
  Filled 2014-10-07: qty 250

## 2014-10-07 MED ORDER — DOCUSATE SODIUM 100 MG PO CAPS
200.0000 mg | ORAL_CAPSULE | Freq: Every day | ORAL | Status: DC
  Administered 2014-10-08 – 2014-10-09 (×2): 200 mg via ORAL
  Filled 2014-10-07 (×4): qty 2

## 2014-10-07 MED ORDER — VANCOMYCIN HCL IN DEXTROSE 1-5 GM/200ML-% IV SOLN
1000.0000 mg | Freq: Once | INTRAVENOUS | Status: AC
  Administered 2014-10-07: 1000 mg via INTRAVENOUS
  Filled 2014-10-07: qty 200

## 2014-10-07 MED ORDER — MAGNESIUM SULFATE 4 GM/100ML IV SOLN
4.0000 g | Freq: Once | INTRAVENOUS | Status: AC
  Administered 2014-10-07: 4 g via INTRAVENOUS
  Filled 2014-10-07: qty 100

## 2014-10-07 MED ORDER — SODIUM CHLORIDE 0.9 % IJ SOLN
3.0000 mL | INTRAMUSCULAR | Status: DC | PRN
Start: 2014-10-08 — End: 2014-10-09

## 2014-10-07 MED ORDER — MORPHINE SULFATE 2 MG/ML IJ SOLN: 1.0000 mg | INTRAMUSCULAR | Status: DC | PRN

## 2014-10-07 MED ORDER — MIDAZOLAM HCL 5 MG/5ML IJ SOLN
INTRAMUSCULAR | Status: DC | PRN
  Administered 2014-10-07: 1 mg via INTRAVENOUS
  Administered 2014-10-07: 4 mg via INTRAVENOUS
  Administered 2014-10-07: 3 mg via INTRAVENOUS

## 2014-10-07 MED ORDER — DEXTROSE 50 % IV SOLN
INTRAVENOUS | Status: AC
  Filled 2014-10-07: qty 50

## 2014-10-07 MED ORDER — METOPROLOL TARTRATE 1 MG/ML IV SOLN: 2.5000 mg | INTRAVENOUS | Status: DC | PRN

## 2014-10-07 MED ORDER — HEPARIN SODIUM (PORCINE) 1000 UNIT/ML IJ SOLN
INTRAMUSCULAR | Status: AC
  Filled 2014-10-07: qty 1

## 2014-10-07 MED ORDER — ROCURONIUM BROMIDE 100 MG/10ML IV SOLN
INTRAVENOUS | Status: DC | PRN
Start: 2014-10-07 — End: 2014-10-07
  Administered 2014-10-07 (×2): 50 mg via INTRAVENOUS

## 2014-10-07 MED ORDER — AMIODARONE IV BOLUS ONLY 150 MG/100ML
INTRAVENOUS | Status: DC | PRN
  Administered 2014-10-07: 150 mg via INTRAVENOUS

## 2014-10-07 MED ORDER — SODIUM CHLORIDE 0.9 % IJ SOLN
OROMUCOSAL | Status: DC | PRN
  Administered 2014-10-07 (×3): 1 mL via TOPICAL

## 2014-10-07 MED ORDER — MUPIROCIN 2 % EX OINT
1.0000 "application " | TOPICAL_OINTMENT | Freq: Two times a day (BID) | CUTANEOUS | Status: AC
  Administered 2014-10-07 – 2014-10-10 (×6): 1 via TOPICAL

## 2014-10-07 MED ORDER — DEXMEDETOMIDINE HCL IN NACL 200 MCG/50ML IV SOLN
0.0000 ug/kg/h | INTRAVENOUS | Status: DC
  Administered 2014-10-07: 0.4 ug/kg/h via INTRAVENOUS
  Filled 2014-10-07: qty 50

## 2014-10-07 MED ORDER — ASPIRIN 81 MG PO CHEW: 324.0000 mg | CHEWABLE_TABLET | Freq: Every day | ORAL | Status: DC

## 2014-10-07 MED ORDER — AMIODARONE IV BOLUS ONLY 150 MG/100ML
150.0000 mg | Freq: Once | INTRAVENOUS | Status: DC
  Filled 2014-10-07: qty 100

## 2014-10-07 MED ORDER — LIDOCAINE HCL (CARDIAC) 20 MG/ML IV SOLN
INTRAVENOUS | Status: DC | PRN
  Administered 2014-10-07: 100 mg via INTRAVENOUS

## 2014-10-07 MED ORDER — PROPOFOL 10 MG/ML IV BOLUS
INTRAVENOUS | Status: DC | PRN
Start: 2014-10-07 — End: 2014-10-07
  Administered 2014-10-07: 50 mg via INTRAVENOUS

## 2014-10-07 MED ORDER — SODIUM CHLORIDE 0.9 % IJ SOLN
INTRAMUSCULAR | Status: AC
  Filled 2014-10-07: qty 20

## 2014-10-07 MED ORDER — ROCURONIUM BROMIDE 50 MG/5ML IV SOLN
INTRAVENOUS | Status: AC
  Filled 2014-10-07: qty 2

## 2014-10-07 MED ORDER — PHENYLEPHRINE 40 MCG/ML (10ML) SYRINGE FOR IV PUSH (FOR BLOOD PRESSURE SUPPORT)
PREFILLED_SYRINGE | INTRAVENOUS | Status: AC
  Filled 2014-10-07: qty 30

## 2014-10-07 MED ORDER — LACTATED RINGERS IV SOLN: INTRAVENOUS | Status: DC

## 2014-10-07 MED ORDER — LACTATED RINGERS IV SOLN
INTRAVENOUS | Status: DC | PRN
  Administered 2014-10-07: 08:00:00 via INTRAVENOUS

## 2014-10-07 MED ORDER — SODIUM CHLORIDE 0.45 % IV SOLN
INTRAVENOUS | Status: DC | PRN
  Administered 2014-10-07: 15:00:00 via INTRAVENOUS

## 2014-10-07 MED ORDER — MIDAZOLAM HCL 10 MG/2ML IJ SOLN
INTRAMUSCULAR | Status: AC
  Filled 2014-10-07: qty 2

## 2014-10-07 MED ORDER — ACETAMINOPHEN 160 MG/5ML PO SOLN: 650.0000 mg | Freq: Once | ORAL | Status: DC

## 2014-10-07 MED ORDER — LACTATED RINGERS IV SOLN
INTRAVENOUS | Status: DC
  Administered 2014-10-07: 15:00:00 via INTRAVENOUS

## 2014-10-07 MED ORDER — BISACODYL 10 MG RE SUPP: 10.0000 mg | Freq: Every day | RECTAL | Status: DC

## 2014-10-07 MED ORDER — SODIUM BICARBONATE 8.4 % IV SOLN
50.0000 meq | Freq: Once | INTRAVENOUS | Status: AC
  Administered 2014-10-07: 50 meq via INTRAVENOUS
  Filled 2014-10-07: qty 50

## 2014-10-07 MED ORDER — METOPROLOL TARTRATE 12.5 MG HALF TABLET
12.5000 mg | ORAL_TABLET | Freq: Two times a day (BID) | ORAL | Status: DC
  Administered 2014-10-08 – 2014-10-09 (×2): 12.5 mg via ORAL
  Filled 2014-10-07 (×7): qty 1

## 2014-10-07 MED ORDER — VECURONIUM BROMIDE 10 MG IV SOLR
INTRAVENOUS | Status: DC | PRN
  Administered 2014-10-07 (×3): 5 mg via INTRAVENOUS

## 2014-10-07 MED ORDER — SODIUM CHLORIDE 0.9 % IV SOLN
INTRAVENOUS | Status: DC | PRN
  Administered 2014-10-07: 14:00:00 via INTRAVENOUS

## 2014-10-07 MED ORDER — BISACODYL 5 MG PO TBEC
10.0000 mg | DELAYED_RELEASE_TABLET | Freq: Every day | ORAL | Status: DC
  Administered 2014-10-08 – 2014-10-09 (×2): 10 mg via ORAL
  Filled 2014-10-07 (×2): qty 2

## 2014-10-07 MED ORDER — PHENYLEPHRINE HCL 10 MG/ML IJ SOLN
INTRAMUSCULAR | Status: AC
  Filled 2014-10-07: qty 1

## 2014-10-07 MED ORDER — PROTAMINE SULFATE 10 MG/ML IV SOLN
INTRAVENOUS | Status: AC
  Filled 2014-10-07: qty 25

## 2014-10-07 MED ORDER — TRAMADOL HCL 50 MG PO TABS
50.0000 mg | ORAL_TABLET | ORAL | Status: DC | PRN
  Administered 2014-10-08 – 2014-10-09 (×2): 100 mg via ORAL
  Filled 2014-10-07 (×2): qty 2

## 2014-10-07 MED ORDER — FENTANYL CITRATE (PF) 100 MCG/2ML IJ SOLN
INTRAMUSCULAR | Status: DC | PRN
  Administered 2014-10-07 (×2): 250 ug via INTRAVENOUS
  Administered 2014-10-07: 150 ug via INTRAVENOUS
  Administered 2014-10-07: 50 ug via INTRAVENOUS
  Administered 2014-10-07: 500 ug via INTRAVENOUS
  Administered 2014-10-07: 250 ug via INTRAVENOUS
  Administered 2014-10-07: 50 ug via INTRAVENOUS

## 2014-10-07 MED ORDER — LEVOFLOXACIN IN D5W 750 MG/150ML IV SOLN
750.0000 mg | INTRAVENOUS | Status: AC
  Administered 2014-10-08: 750 mg via INTRAVENOUS
  Filled 2014-10-07: qty 150

## 2014-10-07 MED ORDER — SODIUM CHLORIDE 0.9 % IV SOLN
5.0000 g | Freq: Once | INTRAVENOUS | Status: DC
  Filled 2014-10-07: qty 20

## 2014-10-07 MED ORDER — 0.9 % SODIUM CHLORIDE (POUR BTL) OPTIME
TOPICAL | Status: DC | PRN
  Administered 2014-10-07: 1000 mL

## 2014-10-07 MED ORDER — LACTATED RINGERS IV SOLN
INTRAVENOUS | Status: DC | PRN
  Administered 2014-10-07 (×2): via INTRAVENOUS

## 2014-10-07 MED ORDER — ACETAMINOPHEN 650 MG RE SUPP: 650.0000 mg | Freq: Once | RECTAL | Status: DC

## 2014-10-07 MED ORDER — PANTOPRAZOLE SODIUM 40 MG PO TBEC
40.0000 mg | DELAYED_RELEASE_TABLET | Freq: Every day | ORAL | Status: DC
  Administered 2014-10-09 – 2014-10-12 (×4): 40 mg via ORAL
  Filled 2014-10-07 (×4): qty 1

## 2014-10-07 MED ORDER — ACETAMINOPHEN 160 MG/5ML PO SOLN
1000.0000 mg | Freq: Four times a day (QID) | ORAL | Status: DC
  Administered 2014-10-08: 1000 mg

## 2014-10-07 MED ORDER — DEXTROSE 50 % IV SOLN
14.0000 mL | Freq: Once | INTRAVENOUS | Status: AC
  Administered 2014-10-07: 14 mL via INTRAVENOUS
  Filled 2014-10-07: qty 50

## 2014-10-07 MED ORDER — DEXTROSE 5 % IV SOLN
0.0000 ug/min | INTRAVENOUS | Status: DC
  Filled 2014-10-07: qty 2

## 2014-10-07 MED ORDER — POTASSIUM CHLORIDE 10 MEQ/50ML IV SOLN
10.0000 meq | Freq: Once | INTRAVENOUS | Status: AC
  Administered 2014-10-07: 10 meq via INTRAVENOUS

## 2014-10-07 MED ORDER — MIDAZOLAM HCL 2 MG/2ML IJ SOLN: 2.0000 mg | INTRAMUSCULAR | Status: DC | PRN

## 2014-10-07 MED ORDER — ASPIRIN EC 325 MG PO TBEC
325.0000 mg | DELAYED_RELEASE_TABLET | Freq: Every day | ORAL | Status: DC
  Administered 2014-10-09 – 2014-10-12 (×4): 325 mg via ORAL
  Filled 2014-10-07 (×5): qty 1

## 2014-10-07 MED ORDER — ONDANSETRON HCL 4 MG/2ML IJ SOLN
4.0000 mg | Freq: Four times a day (QID) | INTRAMUSCULAR | Status: DC | PRN
Start: 2014-10-07 — End: 2014-10-12
  Administered 2014-10-08 – 2014-10-12 (×7): 4 mg via INTRAVENOUS
  Filled 2014-10-07 (×7): qty 2

## 2014-10-07 MED ORDER — LACTATED RINGERS IV SOLN: 500.0000 mL | Freq: Once | INTRAVENOUS | Status: AC | PRN

## 2014-10-07 MED ORDER — METOPROLOL TARTRATE 25 MG/10 ML ORAL SUSPENSION
12.5000 mg | Freq: Two times a day (BID) | ORAL | Status: DC
Start: 2014-10-07 — End: 2014-10-08
  Filled 2014-10-07 (×3): qty 5

## 2014-10-07 MED ORDER — MORPHINE SULFATE 2 MG/ML IJ SOLN
2.0000 mg | INTRAMUSCULAR | Status: DC | PRN
  Administered 2014-10-07 – 2014-10-08 (×4): 2 mg via INTRAVENOUS
  Filled 2014-10-07 (×4): qty 1

## 2014-10-07 MED ORDER — SODIUM CHLORIDE 0.9 % IJ SOLN
3.0000 mL | Freq: Two times a day (BID) | INTRAMUSCULAR | Status: DC
  Administered 2014-10-08 (×2): 3 mL via INTRAVENOUS

## 2014-10-07 MED ORDER — ALBUMIN HUMAN 5 % IV SOLN
INTRAVENOUS | Status: DC | PRN
Start: 2014-10-07 — End: 2014-10-07
  Administered 2014-10-07 (×3): via INTRAVENOUS

## 2014-10-07 MED ORDER — SODIUM CHLORIDE 0.9 % IV SOLN: 250.0000 mL | INTRAVENOUS | Status: DC

## 2014-10-07 MED ORDER — SODIUM CHLORIDE 0.9 % IV SOLN: INTRAVENOUS | Status: DC

## 2014-10-07 MED ORDER — DOPAMINE-DEXTROSE 3.2-5 MG/ML-% IV SOLN: 1.0000 ug/kg/min | INTRAVENOUS | Status: DC

## 2014-10-07 MED ORDER — OXYCODONE HCL 5 MG PO TABS
5.0000 mg | ORAL_TABLET | ORAL | Status: DC | PRN
  Administered 2014-10-08 (×3): 10 mg via ORAL
  Administered 2014-10-11 (×3): 5 mg via ORAL
  Filled 2014-10-07 (×2): qty 2
  Filled 2014-10-07 (×2): qty 1
  Filled 2014-10-07: qty 2
  Filled 2014-10-07: qty 1

## 2014-10-07 MED ORDER — SODIUM CHLORIDE 0.9 % IV SOLN
INTRAVENOUS | Status: DC
  Filled 2014-10-07: qty 2.5

## 2014-10-07 MED ORDER — CETYLPYRIDINIUM CHLORIDE 0.05 % MT LIQD
7.0000 mL | Freq: Four times a day (QID) | OROMUCOSAL | Status: DC
  Administered 2014-10-08 – 2014-10-09 (×7): 7 mL via OROMUCOSAL

## 2014-10-07 MED ORDER — ACETAMINOPHEN 500 MG PO TABS
1000.0000 mg | ORAL_TABLET | Freq: Four times a day (QID) | ORAL | Status: DC
  Administered 2014-10-08 – 2014-10-12 (×8): 1000 mg via ORAL
  Filled 2014-10-07 (×19): qty 2

## 2014-10-07 MED ORDER — HEMOSTATIC AGENTS (NO CHARGE) OPTIME
TOPICAL | Status: DC | PRN
  Administered 2014-10-07: 1 via TOPICAL

## 2014-10-07 MED ORDER — POTASSIUM CHLORIDE 10 MEQ/50ML IV SOLN
10.0000 meq | INTRAVENOUS | Status: AC
  Administered 2014-10-07 (×3): 10 meq via INTRAVENOUS

## 2014-10-07 MED ORDER — SUCCINYLCHOLINE CHLORIDE 20 MG/ML IJ SOLN
INTRAMUSCULAR | Status: AC
  Filled 2014-10-07: qty 1

## 2014-10-07 MED ORDER — PROTAMINE SULFATE 10 MG/ML IV SOLN
INTRAVENOUS | Status: DC | PRN
  Administered 2014-10-07 (×5): 50 mg via INTRAVENOUS

## 2014-10-07 MED ORDER — HEPARIN SODIUM (PORCINE) 1000 UNIT/ML IJ SOLN
INTRAMUSCULAR | Status: DC | PRN
  Administered 2014-10-07: 26000 [IU] via INTRAVENOUS

## 2014-10-07 MED ORDER — PROTAMINE SULFATE 10 MG/ML IV SOLN
INTRAVENOUS | Status: AC
  Filled 2014-10-07: qty 5

## 2014-10-07 MED ORDER — LIDOCAINE HCL (CARDIAC) 20 MG/ML IV SOLN
INTRAVENOUS | Status: AC
  Filled 2014-10-07: qty 5

## 2014-10-07 MED ORDER — SODIUM CHLORIDE 0.9 % IR SOLN
Status: DC | PRN
  Administered 2014-10-07: 3000 mL

## 2014-10-07 MED FILL — Lidocaine HCl IV Inj 20 MG/ML: INTRAVENOUS | Qty: 10 | Status: AC

## 2014-10-07 MED FILL — Sodium Bicarbonate IV Soln 8.4%: INTRAVENOUS | Qty: 100 | Status: AC

## 2014-10-07 MED FILL — Electrolyte-R (PH 7.4) Solution: INTRAVENOUS | Qty: 4000 | Status: AC

## 2014-10-07 MED FILL — Heparin Sodium (Porcine) Inj 1000 Unit/ML: INTRAMUSCULAR | Qty: 10 | Status: AC

## 2014-10-07 MED FILL — Mannitol IV Soln 20%: INTRAVENOUS | Qty: 500 | Status: AC

## 2014-10-07 MED FILL — Sodium Chloride IV Soln 0.9%: INTRAVENOUS | Qty: 2000 | Status: AC

## 2014-10-07 SURGICAL SUPPLY — 160 items
ADAPTER CARDIO PERF ANTE/RETRO (ADAPTER) ×3 IMPLANT
ADH SRG 12 PREFL SYR 3 SPRDR (MISCELLANEOUS)
ADPR PRFSN 84XANTGRD RTRGD (ADAPTER) ×2
APL SRG 7X2 LUM MLBL SLNT (VASCULAR PRODUCTS) ×4
APPLICATOR TIP BIOGLUE STANDRD (MISCELLANEOUS) IMPLANT
APPLICATOR TIP COSEAL (VASCULAR PRODUCTS) ×2 IMPLANT
ATTRACTOMAT 16X20 MAGNETIC DRP (DRAPES) ×3 IMPLANT
BAG DECANTER FOR FLEXI CONT (MISCELLANEOUS) ×6 IMPLANT
BANDAGE ELASTIC 4 VELCRO ST LF (GAUZE/BANDAGES/DRESSINGS) ×3 IMPLANT
BANDAGE ELASTIC 6 VELCRO ST LF (GAUZE/BANDAGES/DRESSINGS) ×3 IMPLANT
BASKET HEART (ORDER IN 25'S) (MISCELLANEOUS) ×1
BASKET HEART (ORDER IN 25S) (MISCELLANEOUS) ×2 IMPLANT
BLADE CORE FAN STRYKER (BLADE) ×6 IMPLANT
BLADE MIC 41X13 (BLADE) ×1 IMPLANT
BLADE OSCILLATING /SAGITTAL (BLADE) ×3 IMPLANT
BLADE SAW SAG 29X58X.64 (BLADE) IMPLANT
BLADE STERNUM SYSTEM 6 (BLADE) ×3 IMPLANT
BLADE SURG 11 STRL SS (BLADE) ×3 IMPLANT
BLADE SURG 15 STRL LF DISP TIS (BLADE) ×2 IMPLANT
BLADE SURG 15 STRL SS (BLADE) ×3
BLADE SURG ROTATE 9660 (MISCELLANEOUS) IMPLANT
BNDG GAUZE ELAST 4 BULKY (GAUZE/BANDAGES/DRESSINGS) ×3 IMPLANT
CANISTER SUCTION 2500CC (MISCELLANEOUS) ×3 IMPLANT
CANNULA AORTIC ROOT 20012 (MISCELLANEOUS) IMPLANT
CANNULA EZ GLIDE AORTIC 21FR (CANNULA) ×5 IMPLANT
CANNULA FEM VENOUS REMOTE 22FR (CANNULA) ×1 IMPLANT
CANNULA GUNDRY RCSP 15FR (MISCELLANEOUS) ×3 IMPLANT
CANNULA SUMP PERICARDIAL (CANNULA) ×1 IMPLANT
CANNULA VENNOUS METAL TIP 20FR (CANNULA) ×1 IMPLANT
CARDIOBLATE CARDIAC ABLATION (MISCELLANEOUS)
CATH CPB KIT OWEN (MISCELLANEOUS) ×3 IMPLANT
CATH HEART VENT LEFT (CATHETERS) ×2 IMPLANT
CATH ROBINSON RED A/P 18FR (CATHETERS) ×6 IMPLANT
CATH THORACIC 36FR (CATHETERS) ×3 IMPLANT
CATH THORACIC 36FR RT ANG (CATHETERS) ×2 IMPLANT
CLIP FOGARTY SPRING 6M (CLIP) IMPLANT
CLIP TI MEDIUM 24 (CLIP) IMPLANT
CLIP TI WIDE RED SMALL 24 (CLIP) IMPLANT
CONN 1/2X1/2X1/2  BEN (MISCELLANEOUS)
CONN 1/2X1/2X1/2 BEN (MISCELLANEOUS) ×2 IMPLANT
CONN 3/8X1/2 ST GISH (MISCELLANEOUS) ×4 IMPLANT
CONN ST 1/4X3/8  BEN (MISCELLANEOUS) ×2
CONN ST 1/4X3/8 BEN (MISCELLANEOUS) IMPLANT
CONN Y 3/8X3/8X3/8  BEN (MISCELLANEOUS)
CONN Y 3/8X3/8X3/8 BEN (MISCELLANEOUS) IMPLANT
CONNECTOR 1/2X3/8X1/2 3 WAY (MISCELLANEOUS) ×1
CONNECTOR 1/2X3/8X1/2 3WAY (MISCELLANEOUS) IMPLANT
CONT SPEC 4OZ CLIKSEAL STRL BL (MISCELLANEOUS) ×2 IMPLANT
COVER PROBE W GEL 5X96 (DRAPES) ×1 IMPLANT
COVER SURGICAL LIGHT HANDLE (MISCELLANEOUS) ×5 IMPLANT
CRADLE DONUT ADULT HEAD (MISCELLANEOUS) ×3 IMPLANT
DEVICE CARDIOBLATE CARDIAC ABL (MISCELLANEOUS) IMPLANT
DRAIN CHANNEL 32F RND 10.7 FF (WOUND CARE) ×8 IMPLANT
DRAPE CARDIOVASCULAR INCISE (DRAPES) ×3
DRAPE INCISE IOBAN 66X45 STRL (DRAPES) ×3 IMPLANT
DRAPE SLUSH/WARMER DISC (DRAPES) ×3 IMPLANT
DRAPE SRG 135X102X78XABS (DRAPES) ×2 IMPLANT
DRSG AQUACEL AG ADV 3.5X14 (GAUZE/BANDAGES/DRESSINGS) ×1 IMPLANT
DRSG COVADERM 4X14 (GAUZE/BANDAGES/DRESSINGS) ×2 IMPLANT
ELECT REM PT RETURN 9FT ADLT (ELECTROSURGICAL) ×6
ELECTRODE REM PT RTRN 9FT ADLT (ELECTROSURGICAL) ×4 IMPLANT
GAUZE SPONGE 4X4 12PLY STRL (GAUZE/BANDAGES/DRESSINGS) ×6 IMPLANT
GLOVE BIO SURGEON STRL SZ 6 (GLOVE) IMPLANT
GLOVE BIO SURGEON STRL SZ 6.5 (GLOVE) IMPLANT
GLOVE BIO SURGEON STRL SZ7 (GLOVE) IMPLANT
GLOVE BIO SURGEON STRL SZ7.5 (GLOVE) IMPLANT
GLOVE BIOGEL M 6.5 STRL (GLOVE) ×3 IMPLANT
GLOVE BIOGEL PI IND STRL 6 (GLOVE) IMPLANT
GLOVE BIOGEL PI IND STRL 6.5 (GLOVE) IMPLANT
GLOVE BIOGEL PI IND STRL 7.0 (GLOVE) IMPLANT
GLOVE BIOGEL PI INDICATOR 6 (GLOVE) ×2
GLOVE BIOGEL PI INDICATOR 6.5 (GLOVE) ×3
GLOVE BIOGEL PI INDICATOR 7.0 (GLOVE) ×3
GLOVE ORTHO TXT STRL SZ7.5 (GLOVE) ×9 IMPLANT
GOWN STRL REUS W/ TWL LRG LVL3 (GOWN DISPOSABLE) ×8 IMPLANT
GOWN STRL REUS W/TWL LRG LVL3 (GOWN DISPOSABLE) ×24
HEMOSTAT POWDER SURGIFOAM 1G (HEMOSTASIS) ×9 IMPLANT
INSERT FOGARTY XLG (MISCELLANEOUS) ×3 IMPLANT
KIT BASIN OR (CUSTOM PROCEDURE TRAY) ×3 IMPLANT
KIT DILATOR VASC 18G NDL (KITS) ×1 IMPLANT
KIT ROOM TURNOVER OR (KITS) ×3 IMPLANT
KIT SUCTION CATH 14FR (SUCTIONS) ×11 IMPLANT
KIT VASOVIEW W/TROCAR VH 2000 (KITS) ×2 IMPLANT
LEAD PACING MYOCARDI (MISCELLANEOUS) ×3 IMPLANT
LOOP VESSEL SUPERMAXI WHITE (MISCELLANEOUS) ×3 IMPLANT
MARKER GRAFT CORONARY BYPASS (MISCELLANEOUS) ×6 IMPLANT
NDL SUT 4 .5 CRC FRENCH EYE (NEEDLE) IMPLANT
NEEDLE FRENCH EYE (NEEDLE) ×3
NS IRRIG 1000ML POUR BTL (IV SOLUTION) ×15 IMPLANT
PACK OPEN HEART (CUSTOM PROCEDURE TRAY) ×3 IMPLANT
PAD ARMBOARD 7.5X6 YLW CONV (MISCELLANEOUS) ×6 IMPLANT
PAD DEFIB R2 (MISCELLANEOUS) IMPLANT
PAD ELECT DEFIB RADIOL ZOLL (MISCELLANEOUS) ×5 IMPLANT
PENCIL BUTTON HOLSTER BLD 10FT (ELECTRODE) ×3 IMPLANT
PUNCH AORTIC ROTATE 4.0MM (MISCELLANEOUS) IMPLANT
PUNCH AORTIC ROTATE 4.5MM 8IN (MISCELLANEOUS) IMPLANT
PUNCH AORTIC ROTATE 5MM 8IN (MISCELLANEOUS) IMPLANT
SEALANT SURG COSEAL 8ML (VASCULAR PRODUCTS) ×1 IMPLANT
SET BERKELEY SUCTION TUBING (SUCTIONS) ×1 IMPLANT
SET IRRIG TUBING LAPAROSCOPIC (IRRIGATION / IRRIGATOR) ×3 IMPLANT
SOLUTION ANTI FOG 6CC (MISCELLANEOUS) IMPLANT
SPONGE GAUZE 4X4 12PLY STER LF (GAUZE/BANDAGES/DRESSINGS) ×1 IMPLANT
SPONGE LAP 18X18 X RAY DECT (DISPOSABLE) IMPLANT
SPONGE LAP 4X18 X RAY DECT (DISPOSABLE) IMPLANT
SUCKER INTRACARDIAC WEIGHTED (SUCKER) ×3 IMPLANT
SUT BONE WAX W31G (SUTURE) ×3 IMPLANT
SUT ETHIBON 2 0 V 52N 30 (SUTURE) IMPLANT
SUT ETHIBON EXCEL 2-0 V-5 (SUTURE) IMPLANT
SUT ETHIBOND 2 0 SH (SUTURE) ×9 IMPLANT
SUT ETHIBOND 2 0 SH 36X2 (SUTURE) ×4 IMPLANT
SUT ETHIBOND 2 0 V4 (SUTURE) IMPLANT
SUT ETHIBOND 2 0V4 GREEN (SUTURE) IMPLANT
SUT ETHIBOND 4 0 RB 1 (SUTURE) ×4 IMPLANT
SUT ETHIBOND V-5 VALVE (SUTURE) IMPLANT
SUT ETHIBOND X763 2 0 SH 1 (SUTURE) ×10 IMPLANT
SUT MNCRL AB 3-0 PS2 18 (SUTURE) ×6 IMPLANT
SUT MNCRL AB 4-0 PS2 18 (SUTURE) IMPLANT
SUT PDS AB 1 CTX 36 (SUTURE) ×6 IMPLANT
SUT PROLENE 2 0 SH DA (SUTURE) IMPLANT
SUT PROLENE 3 0 SH DA (SUTURE) ×3 IMPLANT
SUT PROLENE 3 0 SH1 36 (SUTURE) ×6 IMPLANT
SUT PROLENE 4 0 RB 1 (SUTURE) ×33
SUT PROLENE 4 0 SH DA (SUTURE) IMPLANT
SUT PROLENE 4-0 RB1 .5 CRCL 36 (SUTURE) ×4 IMPLANT
SUT PROLENE 5 0 C 1 36 (SUTURE) ×4 IMPLANT
SUT PROLENE 6 0 C 1 30 (SUTURE) ×1 IMPLANT
SUT PROLENE 7.0 RB 3 (SUTURE) ×9 IMPLANT
SUT PROLENE 8 0 BV175 6 (SUTURE) IMPLANT
SUT PROLENE BLUE 7 0 (SUTURE) ×3 IMPLANT
SUT PROLENE POLY MONO (SUTURE) IMPLANT
SUT SILK  1 MH (SUTURE) ×6
SUT SILK 1 MH (SUTURE) ×6 IMPLANT
SUT SILK 2 0 SH CR/8 (SUTURE) IMPLANT
SUT SILK 3 0 SH CR/8 (SUTURE) IMPLANT
SUT STEEL 6MS V (SUTURE) IMPLANT
SUT STEEL STERNAL CCS#1 18IN (SUTURE) IMPLANT
SUT STEEL SZ 6 DBL 3X14 BALL (SUTURE) IMPLANT
SUT VIC AB 1 CTX 36 (SUTURE)
SUT VIC AB 1 CTX36XBRD ANBCTR (SUTURE) IMPLANT
SUT VIC AB 2-0 CT1 27 (SUTURE)
SUT VIC AB 2-0 CT1 TAPERPNT 27 (SUTURE) IMPLANT
SUT VIC AB 2-0 CTX 27 (SUTURE) ×3 IMPLANT
SUT VIC AB 3-0 SH 27 (SUTURE)
SUT VIC AB 3-0 SH 27X BRD (SUTURE) IMPLANT
SUT VIC AB 3-0 X1 27 (SUTURE) IMPLANT
SUT VICRYL 4-0 PS2 18IN ABS (SUTURE) IMPLANT
SUTURE E-PAK OPEN HEART (SUTURE) ×3 IMPLANT
SYR 10ML KIT SKIN ADHESIVE (MISCELLANEOUS) IMPLANT
SYSTEM SAHARA CHEST DRAIN ATS (WOUND CARE) ×4 IMPLANT
TAPE CLOTH SURG 4X10 WHT LF (GAUZE/BANDAGES/DRESSINGS) ×1 IMPLANT
TOWEL OR 17X24 6PK STRL BLUE (TOWEL DISPOSABLE) ×6 IMPLANT
TOWEL OR 17X26 10 PK STRL BLUE (TOWEL DISPOSABLE) ×6 IMPLANT
TRAY FOLEY IC TEMP SENS 14FR (CATHETERS) ×2 IMPLANT
TRAY FOLEY IC TEMP SENS 16FR (CATHETERS) ×3 IMPLANT
TUBING INSUFFLATION (TUBING) ×2 IMPLANT
UNDERPAD 30X30 INCONTINENT (UNDERPADS AND DIAPERS) ×3 IMPLANT
VALVE AORTIC SZ 21 (Prosthesis & Implant Heart) ×1 IMPLANT
VENT LEFT HEART 12002 (CATHETERS) ×3
WATER STERILE IRR 1000ML POUR (IV SOLUTION) ×6 IMPLANT
YANKAUER SUCT BULB TIP NO VENT (SUCTIONS) ×1 IMPLANT

## 2014-10-07 NOTE — OR Nursing (Signed)
SICU Calls:  90 minutes call @ 1331, 45 minutes call @ 1355, and 20 minutes call @ 1425

## 2014-10-07 NOTE — Progress Notes (Signed)
Patient ID: Michelle Patton, female   DOB: 1956/01/11, 59 y.o.   MRN: [id number]   SICU Evening Rounds:   Hemodynamically stable  CI = 2.2  Has started to wake up on vent.  Urine output good  CT output low  CBC    Component Value Date/Time   WBC 21.1* 10/07/2014 1520   RBC 3.94 10/07/2014 1520   HGB 12.2 10/07/2014 1523   HCT 36.0 10/07/2014 1523   PLT 135* 10/07/2014 1520   MCV 89.8 10/07/2014 1520   MCH 31.0 10/07/2014 1520   MCHC 34.5 10/07/2014 1520   RDW 12.2 10/07/2014 1520   LYMPHSABS 0.2* 09/17/2013 0301   MONOABS 0.4 09/17/2013 0301   EOSABS 0.0 09/17/2013 0301   BASOSABS 0.0 09/17/2013 0301     BMET    Component Value Date/Time   NA 143 10/07/2014 1523   NA 139 09/26/2012 0954   K 3.6 10/07/2014 1523   CL 104 10/07/2014 1351   CO2 21* 10/05/2014 1441   GLUCOSE 72 10/07/2014 1523   GLUCOSE 94 09/26/2012 0954   BUN 16 10/07/2014 1351   BUN 14 09/26/2012 0954   CREATININE 1.00 10/07/2014 1351   CALCIUM 9.7 10/05/2014 1441   GFRNONAA >60 10/05/2014 1441   GFRAA >60 10/05/2014 1441     A/P:  Stable postop course. Continue current plans

## 2014-10-07 NOTE — Anesthesia Procedure Notes (Addendum)
Procedure Name: Intubation Date/Time: 10/07/2014 8:37 AM Performed by: Neldon Newport Pre-anesthesia Checklist: Patient being monitored, Suction available, Emergency Drugs available, Patient identified and Timeout performed Patient Re-evaluated:Patient Re-evaluated prior to inductionOxygen Delivery Method: Circle system utilized Preoxygenation: Pre-oxygenation with 100% oxygen Intubation Type: IV induction Ventilation: Mask ventilation without difficulty Laryngoscope Size: Mac and 2 Grade View: Grade I Tube type: Subglottic suction tube Tube size: 8.0 mm Number of attempts: 1 Placement Confirmation: positive ETCO2,  ETT inserted through vocal cords under direct vision and breath sounds checked- equal and bilateral Secured at: 22 cm Tube secured with: Tape Dental Injury: Teeth and Oropharynx as per pre-operative assessment

## 2014-10-07 NOTE — Anesthesia Preprocedure Evaluation (Addendum)
Anesthesia Evaluation  Patient identified by MRN, date of birth, ID band Patient awake    Reviewed: Allergy & Precautions, NPO status , Patient's Chart, lab work & pertinent test results  Airway Mallampati: II  TM Distance: >3 FB Neck ROM: Full    Dental   Pulmonary sleep apnea , Current Smoker,  breath sounds clear to auscultation        Cardiovascular hypertension, Pt. on medications and Pt. on home beta blockers + Peripheral Vascular Disease + Valvular Problems/Murmurs AS Rhythm:Regular Rate:Normal     Neuro/Psych negative neurological ROS     GI/Hepatic Neg liver ROS, GERD-  ,  Endo/Other  Morbid obesity  Renal/GU negative Renal ROS     Musculoskeletal   Abdominal   Peds  Hematology negative hematology ROS (+)   Anesthesia Other Findings   Reproductive/Obstetrics                            Anesthesia Physical Anesthesia Plan  ASA: IV  Anesthesia Plan: General   Post-op Pain Management:    Induction: Intravenous  Airway Management Planned: Oral ETT  Additional Equipment: Arterial line, CVP, PA Cath, Ultrasound Guidance Line Placement and TEE  Intra-op Plan:   Post-operative Plan: Post-operative intubation/ventilation  Informed Consent: I have reviewed the patients History and Physical, chart, labs and discussed the procedure including the risks, benefits and alternatives for the proposed anesthesia with the patient or authorized representative who has indicated his/her understanding and acceptance.   Dental advisory given  Plan Discussed with: CRNA  Anesthesia Plan Comments:        Anesthesia Quick Evaluation

## 2014-10-07 NOTE — Interval H&P Note (Signed)
History and Physical Interval Note:  10/07/2014 6:58 AM  Michelle Patton  has presented today for surgery, with the diagnosis of AS  The various methods of treatment have been discussed with the patient and family. After consideration of risks, benefits and other options for treatment, the patient has consented to  Procedure(s) with comments: REDO AORTIC VALVE REPLACEMENT (AVR) (N/A) POSSIBLE ASCENDING AORTIC ROOT REPLACEMENT (N/A) - POSSIBLE HOMOGRAFT POSSIBLE CORONARY ARTERY BYPASS GRAFTING (CABG) (N/A) TRANSESOPHAGEAL ECHOCARDIOGRAM (TEE) (N/A) as a surgical intervention .  The patient's history has been reviewed, patient examined, no change in status, stable for surgery.  I have reviewed the patient's chart and labs.  Questions were answered to the patient's satisfaction.     Rexene Alberts

## 2014-10-07 NOTE — Brief Op Note (Addendum)
10/07/2014      [address]       [place] [address]             [phone]   10/07/2014  1:24 PM  PATIENT:  Michelle Patton  59 y.o. 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  Aortic Stenosis:  Yes. Smallest Aortic Valve Area: 0.9cm2; Highest Mean Gradient: 57mmHg.  Etiology (Choose at least one and up to  5 etiologies):  Congenital (other than bicuspid) and Degenerative - Calcified   Aortic Valve  Procedure Performed:  Replacement: Yes.  Autograft. Implant Model Number:995, Size:21, Unique Device Identifier:[id number]  Repair/Reconstruction: No.   Aortic Annular Enlargement: Yes.  COMPLICATIONS: None  BASELINE WEIGHT: 89 kg  PATIENT DISPOSITION:   TO SICU IN STABLE CONDITION  Rexene Alberts 10/07/2014 2:31 PM

## 2014-10-07 NOTE — Op Note (Signed)
CARDIOTHORACIC SURGERY OPERATIVE NOTE  Date of Procedure:  10/07/2014  Preoperative Diagnosis:   Severe Aortic Stenosis   S/P Patch Repair for Congenital Supravalvular Aortic Stenosis  Postoperative Diagnosis: Same   Procedure:    Redo Sternotomy for Aortic Root Replacement  Enlargement of hypoplastic aortic root  Medtronic Freestyle stentless porcine aortic root graft (size 21 mm, model # 502, serial # [id number])   Reimplantation of left main and right coronary arteries   Surgeon: Valentina Gu. 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 The patient states that she underwent open-heart surgery at age 55 for supravalvar aortic stenosis. At the time she lived in [place]. She was told that she had Teflon patch repair of her aorta and that her aortic valve did not need repair. To her recollection there was no mention of any other associated congenital heart defects including no ventricular septal defect nor coarctation of the aorta. She was not allowed to participate in sports during childhood but she otherwise has lived a normal and relatively healthy life ever since. He has been followed periodically with serial echocardiograms, most recently in 2011. She states that she has never been told that there was any  sign of developing valve related problems until recently. Over the past 6 months the patient has developed progressive symptoms of exertional shortness of breath and chest discomfort. She underwent a follow-up transthoracic echocardiogram 06/10/2014 that revealed high velocity jet across the aortic valve measuring greater than 5 m/s suggestive of the presence of severe aortic stenosis or possible recurrence of supravalvar aortic stenosis. Left ventricular size and systolic function remained normal with ejection fraction estimated 55-60%. The patient subsequently underwent transesophageal echocardiogram on 07/20/2014 which demonstrated the presence of severe aortic stenosis with a tricuspid aortic valve that had moderate thickening and sclerosis involving all 3 leaflets of the valve. Peak velocity across aortic valve measured 5.5 m/s corresponding to a mean transvalvular gradient of 64 mmHg. By planimetry the aortic valve area was measured 0.9 cm. The aortic root appeared normal in size and there was no definite sign of residual supravalvular obstruction, although views were somewhat limited. There was moderate aortic insufficiency. Left and right heart catheterization was performed 07/31/2014. This confirmed the presence of severe aortic stenosis with peak-to-peak and mean transvalvular gradients measured 83 and 56 mmHg, respectively by catheterization. Aortic valve area was calculated at 0.52 cm. Pulmonary artery pressures were normal and there was no significant coronary artery disease. The right coronary artery was never directly engaged during catheterization because of unusual angulation from the aortic root, but the vessel did not appear to have significant coronary artery disease. A cardiac gated MRI was performed 08/27/2014 that revealed a trileaflet aortic valve that was abnormal in appearance with significant restriction to leaflet mobility. There was also concern about possible residual membrane or  web in the aortic root consistent  with residual supravalvular aortic stenosis. The patient was referred for surgical consultation. The patient has been seen in consultation and counseled at length regarding the indications, risks and potential benefits of surgery.  All questions have been answered, and the patient provides full informed consent for the operation as described.    DETAILS OF THE OPERATIVE PROCEDURE  Preparation:  The patient is brought to the operating room on the above mentioned date and central monitoring was established by the anesthesia team including placement of Swan-Ganz catheter and radial arterial line. The patient is placed in the supine position on the operating table.  Intravenous antibiotics are administered. General endotracheal anesthesia is induced uneventfully. A Foley catheter is placed.  Baseline transesophageal echocardiogram was performed.  Findings were notable for severe aortic stenosis and moderate aortic insufficiency.  There was normal LV systolic function and mild to moderate LV hypertrophy.  The patient's chest, abdomen, both groins, and both lower extremities are prepared and draped in a sterile manner. A time out procedure is performed.   Surgical Approach:  A redo median sternotomy incision was performed.  The sternum is divided with an oscillating saw. Sternal reentry was uneventful. Dissection is performed to free up the inferior wall of the sternum from the underlying structures of the mediastinum. Dissection is continued until both the left and right pleural spaces were entered. A retractor was placed. Sharp dissection and electrocautery is utilized to dissect the structures of the anterior mediastinum. The ascending aorta is identified and notably small caliber but otherwise normal in appearance.   Extracorporeal Cardiopulmonary Bypass and Myocardial Protection:  The right common femoral vein is cannulated percutaneously using the Seldinger  technique. A guidewire is advanced through the right atrium into the superior vena cava using TEE guidance. The patient is heparinized systemically. The right common femoral vein is cannulated using a 15 French long femoral venous cannula with TEE guidance to position the tip of the cannula in the superior vena cava.  The ascending aorta was cannulated for cardiopulmonary bypass.  Adequate heparinization is verified.  The entire pre-bypass portion of the operation was notable for stable hemodynamics.  Cardiopulmonary bypass is begun. Dissection is continued to separate the right atrium from adjacent structures. The ascending aorta is dissected away from the pulmonary artery.  A retrograde cardioplegia cannula is placed through the right atrium into the coronary sinus.  The operative field was continuously flooded with carbon dioxide gas.  A cardioplegia cannula is placed in the ascending aorta.  A left ventricular vent is placed through the right superior pulmonary vein.  The patient is cooled to 32C systemic temperature.  The aortic cross clamp is applied and cold blood cardioplegia is delivered initially in an antegrade fashion through the aortic root.   Supplemental cardioplegia is given retrograde through the coronary sinus catheter.  Iced saline slush is applied for topical hypothermia.  The initial cardioplegic arrest is rapid with early diastolic arrest.  Repeat doses of cardioplegia are administered intermittently throughout the entire cross clamp portion of the operation through the coronary sinus catheter in order to maintain completely flat electrocardiogram.  Myocardial protection was felt to be excellent.   Aortic Root Replacement:  The proximal aorta and aortic root is dissected circumferentially around its external surface. The old graft utilized to patch the aortic root is identified along the right lateral surface of the aorta, extending down into the non-coronary sinus of Valsalva. The  aorta is transected at the level of the sinotubular junction. The aortic  root is examined.  The aortic root itself is hypoplastic. The patch repair from previous surgery extends partway down the non-coronary sinus of Valsalva but ends well above the aortic annulus. The left main coronary ostium is in normal anatomical position. There remains a shelf of aortic tissue extending across the aortic root immediately above the ostium of the right coronary artery. The aortic valve itself is tricuspid. The non-coronary leaflet of the aortic valve is congenitally malformed, severely thickened and fibrotic.  The other 2 leaflets are moderately thickened. The aortic valve leaflets are all excised sharply. The non-coronary sinus of Valsalva tissue is excised. The left main and the right coronary arteries are each mobilized on separate buttons of aortic tissue. The congenital membrane across the aortic root above the right coronary artery was excised. The aortic annulus was decalcified. Decalcification was straightforward and did not require extensive debridement. The aortic annulus is notably quite small, too small to accept a 19 mm stented bioprosthetic tissue valve sizer. The aortic annulus measures 20-21 mm in diameter.  After further debridement to enlarge the root it will accept a 21 mm stentless porcine aortic root graft sizer. The aortic root is irrigated with copious saline solution.  A Medtronic Freestyle stentless porcine aortic root graft (size 21 mm, model # 995, serial # [id number]) was rinsed in saline and prepared for implantation per manufacturer protocol. The proximal suture line was constructed using interrupted simple 4-0 Ethibond sutures. The left main and right coronary arteries were each reimplanted onto their corresponding sinus of Valsalva portion of the aortic root graft using running 5-0 proline suture.  The distal end of the aortic root graft and the corresponding proximal end of the ascending thoracic  aorta were trimmed and beveled to an appropriate length. The distal suture line was constructed using running 4-0 proline suture.   Procedure Completion:  One final dose of warm retrograde "hot shot" cardioplegia was administered retrograde through the coronary sinus catheter while all air was evacuated through the aortic root.  The aortic cross clamp was removed after a total cross clamp time of 123 minutes.  Epicardial pacing wires are fixed to the right ventricular outflow tract and to the right atrial appendage. The patient is rewarmed to 37C temperature. The aortic and left ventricular vents are removed.  With initial attempt to wean from cardia point bypass there is a fairly large pocket of air in the left ventricular chamber. The patient subsequently develops acute EKG changes followed by ventricular fibrillation. Cardiopulmonary bypass is resumed. A left ventricular vent is replaced. Aortic root vent is replaced. The patient was rested on cardiopulmonary bypass and all air evacuated.  The patient is weaned and disconnected from cardiopulmonary bypass.  The patient's rhythm at separation from bypass was sinus.  The patient was weaned from cardioplegic bypass without any inotropic support. Total cardiopulmonary bypass time for the operation was 186 minutes.  Followup transesophageal echocardiogram performed after separation from bypass revealed a normal functioning aortic valve with no aortic insufficiency.  Left ventricular function was unchanged from preoperatively.  The aortic and venous cannula were removed uneventfully. Protamine was administered to reverse the anticoagulation. The mediastinum and pleural space were inspected for hemostasis and irrigated with saline solution. The mediastinum and both pleural spaces were drained using 4 chest tubes placed through separate stab incisions inferiorly.  The soft tissues anterior to the aorta were reapproximated loosely. The sternum is closed with  double strength sternal wire. The soft tissues anterior to the sternum were  closed in multiple layers and the skin is closed with a running subcuticular skin closure.  The post-bypass portion of the operation was notable for stable rhythm and hemodynamics.  No blood products were administered during the operation.   Disposition:  The patient tolerated the procedure well and is transported to the surgical intensive care in stable condition. There are no intraoperative complications. All sponge instrument and needle counts are verified correct at completion of the operation.    Valentina Gu. Roxy Manns MD 10/07/2014 2:36 PM

## 2014-10-07 NOTE — Progress Notes (Signed)
Echocardiogram Echocardiogram Transesophageal has been performed.  Michelle Patton 10/07/2014, 9:40 AM

## 2014-10-07 NOTE — Progress Notes (Signed)
RT did a recruitment maneuver for 2 minutes.  Pt's BP increased to 175/58 at the end of recruitment but then went back down. Placed patient back in SIMV and changed rate to 16 per Physician's orders

## 2014-10-07 NOTE — Anesthesia Postprocedure Evaluation (Signed)
  Anesthesia Post-op Note  Patient: Michelle Patton  Procedure(s) Performed: Procedure(s): REDO AORTIC VALVE REPLACEMENT (AVR) (N/A) TRANSESOPHAGEAL ECHOCARDIOGRAM (TEE) (N/A) ASCENDING AORTIC ROOT REPLACEMENT (N/A) REDO STERNOTOMY (N/A)  Patient Location: ICU  Anesthesia Type:General  Level of Consciousness: Patient remains intubated per anesthesia plan  Airway and Oxygen Therapy: Patient remains intubated per anesthesia plan  Post-op Pain: none  Post-op Assessment: Post-op Vital signs reviewed              Post-op Vital Signs: Reviewed  Last Vitals:  Filed Vitals:   10/07/14 1600  BP: 119/61  Pulse: 90  Temp: 36.4 C  Resp: 16    Complications: No apparent anesthesia complications

## 2014-10-07 NOTE — Transfer of Care (Signed)
Immediate Anesthesia Transfer of Care Note  Patient: Michelle Patton  Procedure(s) Performed: Procedure(s): REDO AORTIC VALVE REPLACEMENT (AVR) (N/A) TRANSESOPHAGEAL ECHOCARDIOGRAM (TEE) (N/A) ASCENDING AORTIC ROOT REPLACEMENT (N/A) REDO STERNOTOMY (N/A)  Patient Location: SICU  Anesthesia Type:General  Level of Consciousness: Patient remains intubated per anesthesia plan  Airway & Oxygen Therapy: Patient placed on Ventilator (see vital sign flow sheet for setting)  Post-op Assessment: Report given to RN  Post vital signs: Reviewed and stable  Last Vitals:  Filed Vitals:   10/07/14 0653  BP: 113/52  Pulse: 69  Temp: 79.7 C    Complications: No apparent anesthesia complications

## 2014-10-08 ENCOUNTER — Inpatient Hospital Stay (HOSPITAL_COMMUNITY): Payer: BLUE CROSS/BLUE SHIELD

## 2014-10-08 LAB — BASIC METABOLIC PANEL
Anion gap: 9 (ref 5–15)
BUN: 17 mg/dL (ref 6–20)
CO2: 24 mmol/L (ref 22–32)
Calcium: 7.5 mg/dL — ABNORMAL LOW (ref 8.9–10.3)
Chloride: 110 mmol/L (ref 101–111)
Creatinine, Ser: 0.91 mg/dL (ref 0.44–1.00)
GFR calc Af Amer: >60 mL/min (ref >60)
Glucose, Bld: 139 mg/dL — ABNORMAL HIGH (ref 65–99)
Potassium: 3.7 mmol/L (ref 3.5–5.1)
Sodium: 143 mmol/L (ref 135–145)

## 2014-10-08 LAB — GLUCOSE, CAPILLARY
GLUCOSE-CAPILLARY: 106 mg/dL — AB (ref 65–99)
GLUCOSE-CAPILLARY: 109 mg/dL — AB (ref 65–99)
GLUCOSE-CAPILLARY: 114 mg/dL — AB (ref 65–99)
GLUCOSE-CAPILLARY: 130 mg/dL — AB (ref 65–99)
GLUCOSE-CAPILLARY: 144 mg/dL — AB (ref 65–99)
GLUCOSE-CAPILLARY: 152 mg/dL — AB (ref 65–99)
Glucose-Capillary: 136 mg/dL — ABNORMAL HIGH (ref 65–99)
Glucose-Capillary: 97 mg/dL (ref 65–99)
Glucose-Capillary: 106 mg/dL — ABNORMAL HIGH (ref 65–99)
Glucose-Capillary: 130 mg/dL — ABNORMAL HIGH (ref 65–99)
Glucose-Capillary: 132 mg/dL — ABNORMAL HIGH (ref 65–99)
Glucose-Capillary: 118 mg/dL — ABNORMAL HIGH (ref 65–99)

## 2014-10-08 LAB — POCT I-STAT, CHEM 8
BUN: 20 mg/dL (ref 6–20)
CALCIUM ION: 1.14 mmol/L (ref 1.12–1.23)
Chloride: 105 mmol/L (ref 101–111)
Creatinine, Ser: 1 mg/dL (ref 0.44–1.00)
Glucose, Bld: 119 mg/dL — ABNORMAL HIGH (ref 65–99)
HEMATOCRIT: 30 % — AB (ref 36.0–46.0)
HEMOGLOBIN: 10.2 g/dL — AB (ref 12.0–15.0)
Potassium: 4.2 mmol/L (ref 3.5–5.1)
Sodium: 139 mmol/L (ref 135–145)
TCO2: 24 mmol/L (ref 0–100)

## 2014-10-08 LAB — CBC
HCT: 30 % — ABNORMAL LOW (ref 36.0–46.0)
HEMATOCRIT: 29.6 % — AB (ref 36.0–46.0)
HEMOGLOBIN: 10.1 g/dL — AB (ref 12.0–15.0)
Hemoglobin: 10.4 g/dL — ABNORMAL LOW (ref 12.0–15.0)
MCH: 30.4 pg (ref 26.0–34.0)
MCH: 31.6 pg (ref 26.0–34.0)
MCHC: 34.1 g/dL (ref 30.0–36.0)
MCHC: 34.7 g/dL (ref 30.0–36.0)
MCV: 89.2 fL (ref 78.0–100.0)
MCV: 91.2 fL (ref 78.0–100.0)
PLATELETS: 106 10*3/uL — AB (ref 150–400)
Platelets: 117 10*3/uL — ABNORMAL LOW (ref 150–400)
RBC: 3.32 MIL/uL — AB (ref 3.87–5.11)
RBC: 3.29 MIL/uL — ABNORMAL LOW (ref 3.87–5.11)
RDW: 12.3 % (ref 11.5–15.5)
RDW: 12.6 % (ref 11.5–15.5)
WBC: 14.3 10*3/uL — ABNORMAL HIGH (ref 4.0–10.5)
WBC: 15.3 10*3/uL — AB (ref 4.0–10.5)

## 2014-10-08 LAB — POCT I-STAT 3, ART BLOOD GAS (G3+)
ACID-BASE DEFICIT: 3 mmol/L — AB (ref 0.0–2.0)
Acid-base deficit: 3 mmol/L — ABNORMAL HIGH (ref 0.0–2.0)
Bicarbonate: 23.2 mEq/L (ref 20.0–24.0)
Bicarbonate: 22.4 mEq/L (ref 20.0–24.0)
O2 Saturation: 97 %
O2 Saturation: 98 %
PO2 ART: 106 mmHg — AB (ref 80.0–100.0)
Patient temperature: 36.9
TCO2: 24 mmol/L (ref 0–100)
TCO2: 24 mmol/L (ref 0–100)
pCO2 arterial: 42.9 mmHg (ref 35.0–45.0)
pCO2 arterial: 43.3 mmHg (ref 35.0–45.0)
pH, Arterial: 7.34 — ABNORMAL LOW (ref 7.350–7.450)
pH, Arterial: 7.322 — ABNORMAL LOW (ref 7.350–7.450)
pO2, Arterial: 92 mmHg (ref 80.0–100.0)

## 2014-10-08 LAB — CREATININE, SERUM
Creatinine, Ser: 1 mg/dL (ref 0.44–1.00)
GFR calc Af Amer: >60 mL/min (ref >60)
GFR calc non Af Amer: >60 mL/min (ref >60)

## 2014-10-08 LAB — BLOOD PRODUCT ORDER (VERBAL) VERIFICATION

## 2014-10-08 LAB — MAGNESIUM
Magnesium: 2.2 mg/dL (ref 1.7–2.4)
Magnesium: 2.1 mg/dL (ref 1.7–2.4)

## 2014-10-08 MED ORDER — POTASSIUM CHLORIDE 10 MEQ/50ML IV SOLN
10.0000 meq | INTRAVENOUS | Status: AC
  Administered 2014-10-08 (×2): 10 meq via INTRAVENOUS
  Filled 2014-10-08 (×2): qty 50

## 2014-10-08 MED ORDER — ENOXAPARIN SODIUM 30 MG/0.3ML ~~LOC~~ SOLN
30.0000 mg | SUBCUTANEOUS | Status: DC
  Administered 2014-10-09 – 2014-10-11 (×3): 30 mg via SUBCUTANEOUS
  Filled 2014-10-08 (×4): qty 0.3

## 2014-10-08 MED ORDER — MIDAZOLAM HCL 2 MG/2ML IJ SOLN
2.0000 mg | Freq: Once | INTRAMUSCULAR | Status: DC
  Filled 2014-10-08: qty 2

## 2014-10-08 MED ORDER — POTASSIUM CHLORIDE 10 MEQ/50ML IV SOLN
10.0000 meq | INTRAVENOUS | Status: DC
  Administered 2014-10-08 (×3): 10 meq via INTRAVENOUS
  Filled 2014-10-08: qty 50

## 2014-10-08 MED ORDER — KETOROLAC TROMETHAMINE 15 MG/ML IJ SOLN
15.0000 mg | Freq: Four times a day (QID) | INTRAMUSCULAR | Status: AC
  Administered 2014-10-08 – 2014-10-09 (×5): 15 mg via INTRAVENOUS
  Filled 2014-10-08 (×5): qty 1

## 2014-10-08 MED ORDER — INSULIN ASPART 100 UNIT/ML ~~LOC~~ SOLN
0.0000 [IU] | SUBCUTANEOUS | Status: DC
  Administered 2014-10-08: 2 [IU] via SUBCUTANEOUS

## 2014-10-08 MED ORDER — CHLORHEXIDINE GLUCONATE CLOTH 2 % EX PADS
6.0000 | MEDICATED_PAD | Freq: Every day | CUTANEOUS | Status: DC
  Administered 2014-10-09 – 2014-10-12 (×4): 6 via TOPICAL

## 2014-10-08 MED ORDER — FUROSEMIDE 10 MG/ML IJ SOLN
20.0000 mg | Freq: Four times a day (QID) | INTRAMUSCULAR | Status: AC
  Administered 2014-10-08 (×3): 20 mg via INTRAVENOUS
  Filled 2014-10-08 (×3): qty 2

## 2014-10-08 MED ORDER — LACTATED RINGERS IV SOLN: INTRAVENOUS | Status: DC

## 2014-10-08 MED ORDER — MORPHINE SULFATE 2 MG/ML IJ SOLN
2.0000 mg | INTRAMUSCULAR | Status: DC | PRN
  Administered 2014-10-08 (×2): 2 mg via INTRAVENOUS
  Filled 2014-10-08 (×2): qty 1

## 2014-10-08 MED FILL — Potassium Chloride Inj 2 mEq/ML: INTRAVENOUS | Qty: 40 | Status: AC

## 2014-10-08 MED FILL — Magnesium Sulfate Inj 50%: INTRAMUSCULAR | Qty: 10 | Status: AC

## 2014-10-08 MED FILL — Heparin Sodium (Porcine) Inj 1000 Unit/ML: INTRAMUSCULAR | Qty: 30 | Status: AC

## 2014-10-08 NOTE — Progress Notes (Signed)
      [address]       [place] [address]             [phone]      POD # 1   Resting comfortably  BP 110/65 mmHg  Pulse 75  Temp(Src) 99 F (37.2 C) (Oral)  Resp 13  Ht 5\' 6"  (1.676 m)  Wt 209 lb 3.5 oz (94.9 kg)  BMI 33.78 kg/m2  SpO2 96%   Intake/Output Summary (Last 24 hours) at 10/08/14 1912 Last data filed at 10/08/14 1800  Gross per 24 hour  Intake 903.25 ml  Output   4230 ml  Net -3326.75 ml    Diuresing well  Continue current care  Glennis Montenegro C. Roxan Hockey, MD [facility] [phone]

## 2014-10-08 NOTE — Progress Notes (Signed)
NIF -20cmH20, and VC 0.9L (>10cc/kg).

## 2014-10-08 NOTE — Procedures (Signed)
Extubation Procedure Note  Patient Details:   Name: Michelle Patton DOB: March 01, 1956 MRN: [id number]   Airway Documentation:     Evaluation  O2 sats: stable throughout Complications: No apparent complications Patient did tolerate procedure well. Bilateral Breath Sounds: Diminished Suctioning: Airway Yes   Extubated pt to 4lpm [name]. Cuff leak present. No stridor detected. Pt able say her name. VS WNL. No complications noted.   [place] 10/08/2014, 1:59 AM

## 2014-10-08 NOTE — Progress Notes (Signed)
      [address]       [place] [address]             [phone]        CARDIOTHORACIC SURGERY PROGRESS NOTE   R1 Day Post-Op Procedure(s) (LRB): REDO AORTIC VALVE REPLACEMENT (AVR) (N/A) TRANSESOPHAGEAL ECHOCARDIOGRAM (TEE) (N/A) ASCENDING AORTIC ROOT REPLACEMENT (N/A) REDO STERNOTOMY (N/A)  Subjective: Feels sore in chest.  No other specific complaints.  Objective: Vital signs: BP Readings from Last 1 Encounters:  10/08/14 102/60   Pulse Readings from Last 1 Encounters:  10/08/14 90   Resp Readings from Last 1 Encounters:  10/08/14 13   Temp Readings from Last 1 Encounters:  10/08/14 98.2 F (36.8 C)     Hemodynamics: PAP: (24-48)/(11-25) 36/18 mmHg CO:  [3.5 L/min-7.1 L/min] 7.1 L/min CI:  [1.8 L/min/m2-3.6 L/min/m2] 3.6 L/min/m2  Physical Exam:  Rhythm:   sinus  Breath sounds: clear  Heart sounds:  RRR  Incisions:  Dressing dry, intact  Abdomen:  Soft, non-distended, non-tender  Extremities:  Warm, well-perfused  Chest tubes:  Low volume thin serosanguinous output, no air leak    Intake/Output from previous day: 06/15 0701 - 06/16 0700 In: 8338.2 [I.V.:3399.2; Blood:550; NG/GT:60; IV Piggyback:1990] Out: 5053 [Urine:4980; Blood:1400; Chest Tube:610] Intake/Output this shift:    Lab Results:  CBC: Recent Labs  10/07/14 2100 10/08/14 0420  WBC 15.3* 14.3*  HGB 11.5* 10.1*  HCT 33.1* 29.6*  PLT 106* 117*    BMET:  Recent Labs  10/05/14 1441  10/07/14 2057 10/07/14 2100 10/08/14 0420  NA 139  < > 144  --  143  K 3.9  < > 4.0  --  3.7  CL 109  < > 107  --  110  CO2 21*  --   --   --  24  GLUCOSE 100*  < > 150*  --  139*  BUN 11  < > 18  --  17  CREATININE 0.99  < > 0.90 0.92 0.91  CALCIUM 9.7  --   --   --  7.5*  < > = values in this interval not displayed.   PT/INR:   Recent Labs  10/07/14 1520  LABPROT 19.5*  INR 1.65*    CBG (last 3)   Recent Labs  10/08/14 0100 10/08/14 0146 10/08/14 0323    GLUCAP 97 106* 130*    ABG    Component Value Date/Time   PHART 7.322* 10/08/2014 0302   PCO2ART 43.3 10/08/2014 0302   PO2ART 106.0* 10/08/2014 0302   HCO3 22.4 10/08/2014 0302   TCO2 24 10/08/2014 0302   ACIDBASEDEF 3.0* 10/08/2014 0302   O2SAT 98.0 10/08/2014 0302    CXR: Clear w/ mild bibasilar atelectasis  Assessment/Plan: S/P Procedure(s) (LRB): REDO AORTIC VALVE REPLACEMENT (AVR) (N/A) TRANSESOPHAGEAL ECHOCARDIOGRAM (TEE) (N/A) ASCENDING AORTIC ROOT REPLACEMENT (N/A) REDO STERNOTOMY (N/A)  Overall doing quite well POD1 Maintaining NSR w/ stable hemodynamics, no drips Expected post op acute blood loss anemia, mild Expected post op volume excess, mild Expected post op atelectasis, mild   Mobilize  Diuresis  D/C lines  Add low dose toradol for pain   Rexene Alberts 10/08/2014 8:05 AM

## 2014-10-09 ENCOUNTER — Inpatient Hospital Stay (HOSPITAL_COMMUNITY): Payer: BLUE CROSS/BLUE SHIELD

## 2014-10-09 ENCOUNTER — Encounter (HOSPITAL_COMMUNITY): Payer: Self-pay | Admitting: Thoracic Surgery (Cardiothoracic Vascular Surgery)

## 2014-10-09 LAB — CBC
HEMATOCRIT: 28.4 % — AB (ref 36.0–46.0)
HEMOGLOBIN: 9.6 g/dL — AB (ref 12.0–15.0)
MCH: 31 pg (ref 26.0–34.0)
MCHC: 33.8 g/dL (ref 30.0–36.0)
MCV: 91.6 fL (ref 78.0–100.0)
Platelets: 100 10*3/uL — ABNORMAL LOW (ref 150–400)
RBC: 3.1 MIL/uL — ABNORMAL LOW (ref 3.87–5.11)
RDW: 12.7 % (ref 11.5–15.5)
WBC: 12.5 10*3/uL — ABNORMAL HIGH (ref 4.0–10.5)

## 2014-10-09 LAB — BASIC METABOLIC PANEL
ANION GAP: 6 (ref 5–15)
BUN: 17 mg/dL (ref 6–20)
CHLORIDE: 102 mmol/L (ref 101–111)
CO2: 29 mmol/L (ref 22–32)
Calcium: 8.1 mg/dL — ABNORMAL LOW (ref 8.9–10.3)
Creatinine, Ser: 0.96 mg/dL (ref 0.44–1.00)
Glucose, Bld: 112 mg/dL — ABNORMAL HIGH (ref 65–99)
Potassium: 3.8 mmol/L (ref 3.5–5.1)
Sodium: 137 mmol/L (ref 135–145)

## 2014-10-09 LAB — GLUCOSE, CAPILLARY
GLUCOSE-CAPILLARY: 100 mg/dL — AB (ref 65–99)
Glucose-Capillary: 115 mg/dL — ABNORMAL HIGH (ref 65–99)

## 2014-10-09 MED ORDER — SODIUM CHLORIDE 0.9 % IJ SOLN: 3.0000 mL | INTRAMUSCULAR | Status: DC | PRN

## 2014-10-09 MED ORDER — CLONAZEPAM 0.5 MG PO TABS
0.5000 mg | ORAL_TABLET | Freq: Every day | ORAL | Status: DC
  Administered 2014-10-09 – 2014-10-11 (×3): 0.5 mg via ORAL
  Filled 2014-10-09 (×3): qty 1

## 2014-10-09 MED ORDER — LISINOPRIL 10 MG PO TABS
10.0000 mg | ORAL_TABLET | Freq: Every day | ORAL | Status: DC
  Administered 2014-10-09 – 2014-10-12 (×4): 10 mg via ORAL
  Filled 2014-10-09 (×4): qty 1

## 2014-10-09 MED ORDER — SODIUM CHLORIDE 0.9 % IV SOLN: 250.0000 mL | INTRAVENOUS | Status: DC | PRN

## 2014-10-09 MED ORDER — POTASSIUM CHLORIDE 10 MEQ/50ML IV SOLN
10.0000 meq | INTRAVENOUS | Status: AC
  Administered 2014-10-09 (×3): 10 meq via INTRAVENOUS
  Filled 2014-10-09 (×3): qty 50

## 2014-10-09 MED ORDER — SODIUM CHLORIDE 0.9 % IJ SOLN
3.0000 mL | Freq: Two times a day (BID) | INTRAMUSCULAR | Status: DC
  Administered 2014-10-09: 10 mL via INTRAVENOUS
  Administered 2014-10-09 – 2014-10-11 (×5): 3 mL via INTRAVENOUS

## 2014-10-09 MED ORDER — POTASSIUM CHLORIDE CRYS ER 20 MEQ PO TBCR
20.0000 meq | EXTENDED_RELEASE_TABLET | Freq: Two times a day (BID) | ORAL | Status: DC
  Administered 2014-10-10 (×2): 20 meq via ORAL
  Filled 2014-10-09 (×4): qty 1

## 2014-10-09 MED ORDER — MOVING RIGHT ALONG BOOK
Freq: Once | Status: AC
  Administered 2014-10-09: 07:00:00
  Filled 2014-10-09: qty 1

## 2014-10-09 MED ORDER — FUROSEMIDE 40 MG PO TABS
40.0000 mg | ORAL_TABLET | Freq: Two times a day (BID) | ORAL | Status: DC
  Administered 2014-10-09 – 2014-10-10 (×3): 40 mg via ORAL
  Filled 2014-10-09 (×7): qty 1

## 2014-10-09 NOTE — Discharge Instructions (Signed)
Aortic Valve Replacement, Care After °Refer to this sheet in the next few weeks. These instructions provide you with information on caring for yourself after your procedure. Your health care provider may also give you specific instructions. Your treatment has been planned according to current medical practices, but problems sometimes occur. Call your health care provider if you have any problems or questions after your procedure. °HOME CARE INSTRUCTIONS  °· Take medicines only as directed by your health care provider. °· If your health care provider has prescribed elastic stockings, wear them as directed. °· Take frequent naps or rest often throughout the day. °· Avoid lifting over 10 lbs (4.5 kg) or pushing or pulling things with your arms for 6-8 weeks or as directed by your health care provider. °· Avoid driving or airplane travel for 4-6 weeks after surgery or as directed by your health care provider. If you are riding in a car for an extended period, stop every 1-2 hours to stretch your legs. Keep a record of your medicines and medical history with you when traveling. °· Do not drive or operate heavy machinery while taking pain medicine. (narcotics). °· Do not cross your legs. °· Do not use any tobacco products including cigarettes, chewing tobacco, or electronic cigarettes. If you need help quitting, ask your health care provider. °· Do not take baths, swim, or use a hot tub until your health care provider approves. Take showers once your health care provider approves. Pat incisions dry. Do not rub incisions with a washcloth or towel. °· Avoid climbing stairs and using the handrail to pull yourself up for the first 2-3 weeks after surgery. °· Return to work as directed by your health care provider. °· Drink enough fluid to keep your urine clear or pale yellow. °· Do not strain to have a bowel movement. Eat high-fiber foods if you become constipated. You may also take a medicine to help you have a bowel  movement (laxative) as directed by your health care provider. °· Resume sexual activity as directed by your health care provider. Men should not use medicines for erectile dysfunction until their doctor says it is okay. °· If you had a certain type of heart condition in the past, you may need to take antibiotic medicine before having dental work or surgery. Let your dentist and health care providers know if you had one or more of the following: °¨ Previous endocarditis. °¨ An artificial (prosthetic) heart valve. °¨ Congenital heart disease. °SEEK MEDICAL CARE IF: °· You develop a skin rash.   °· You experience sudden changes in your weight. °· You have a fever. °SEEK IMMEDIATE MEDICAL CARE IF:  °· You develop chest pain that is not coming from your incision. °· You have drainage (pus), redness, swelling, or pain at your incision site.   °· You develop shortness of breath or have difficulty breathing.   °· You have increased bleeding from your incision site.   °· You develop light-headedness.   °MAKE SURE YOU:  °· Understand these directions. °· Will watch your condition. °· Will get help right away if you are not doing well or get worse. °Document Released: 10/27/2004 Document Revised: 08/25/2013 Document Reviewed: 01/23/2012 °[employer]® Patient Information ©2015 [employer]. This information is not intended to replace advice given to you by your health care provider. Make sure you discuss any questions you have with your health care provider. ° °

## 2014-10-09 NOTE — Progress Notes (Signed)
1430 Came to see pt to walk. Pt hesitant to answer, frowning. Tried to get pt to agree to walk but declined.  Checked  sats on RA at 92%. Encouraged pt to walk with staff later. Discussed with RN. Graylon Good RN BSN 10/09/2014 2:34 PM

## 2014-10-09 NOTE — Progress Notes (Addendum)
      [address]       [place] [address]             [phone]        CARDIOTHORACIC SURGERY PROGRESS NOTE   R2 Days Post-Op Procedure(s) (LRB): REDO AORTIC VALVE REPLACEMENT (AVR) (N/A) TRANSESOPHAGEAL ECHOCARDIOGRAM (TEE) (N/A) ASCENDING AORTIC ROOT REPLACEMENT (N/A) REDO STERNOTOMY (N/A)  Subjective: Looks good.  Very mild soreness in chest.  No SOB.  Poor appetite.  Didn't sleep much last night.  No specific complaints  Objective: Vital signs: BP Readings from Last 1 Encounters:  10/09/14 144/62   Pulse Readings from Last 1 Encounters:  10/09/14 75   Resp Readings from Last 1 Encounters:  10/09/14 16   Temp Readings from Last 1 Encounters:  10/09/14 98.2 F (36.8 C) Oral    Hemodynamics: PAP: (37-51)/(13-22) 51/22 mmHg  Physical Exam:  Rhythm:   sinus  Breath sounds: clear  Heart sounds:  RRR  Incisions:  Dressing dry, intact  Abdomen:  Soft, non-distended, non-tender  Extremities:  Warm, well-perfused    Intake/Output from previous day: 06/16 0701 - 06/17 0700 In: 543.8 [P.O.:60; I.V.:183.8; IV Piggyback:300] Out: 9702 [Urine:3065; Chest Tube:290] Intake/Output this shift:    Lab Results:  CBC: Recent Labs  10/08/14 1715 10/08/14 1718 10/09/14 0440  WBC 15.3*  --  12.5*  HGB 10.4* 10.2* 9.6*  HCT 30.0* 30.0* 28.4*  PLT 106*  --  100*    BMET:  Recent Labs  10/08/14 0420  10/08/14 1718 10/09/14 0440  NA 143  --  139 137  K 3.7  --  4.2 3.8  CL 110  --  105 102  CO2 24  --   --  29  GLUCOSE 139*  --  119* 112*  BUN 17  --  20 17  CREATININE 0.91  < > 1.00 0.96  CALCIUM 7.5*  --   --  8.1*  < > = values in this interval not displayed.   PT/INR:   Recent Labs  10/07/14 1520  LABPROT 19.5*  INR 1.65*    CBG (last 3)   Recent Labs  10/08/14 2030 10/08/14 2359 10/09/14 0349  GLUCAP 109* 115* 100*    ABG    Component Value Date/Time   PHART 7.322* 10/08/2014 0302   PCO2ART 43.3 10/08/2014 0302    PO2ART 106.0* 10/08/2014 0302   HCO3 22.4 10/08/2014 0302   TCO2 24 10/08/2014 1718   ACIDBASEDEF 3.0* 10/08/2014 0302   O2SAT 98.0 10/08/2014 0302    CXR: Clear w/ mild bibasilar atelectasis  Assessment/Plan: S/P Procedure(s) (LRB): REDO AORTIC VALVE REPLACEMENT (AVR) (N/A) TRANSESOPHAGEAL ECHOCARDIOGRAM (TEE) (N/A) ASCENDING AORTIC ROOT REPLACEMENT (N/A) REDO STERNOTOMY (N/A)  Doing well POD2 Maintaining NSR w/ stable BP Expected post op acute blood loss anemia, mild, stable Expected post op volume excess, mild, diuresing Expected post op atelectasis, mild   Mobilize  Diuresis  Continue routine care  Restart ACE-I at reduced dose  Lovenox for DVT prophylaxis  Transfer step down   Rexene Alberts 10/09/2014 7:03 AM

## 2014-10-09 NOTE — Discharge Summary (Signed)
Physician Discharge Summary  Patient ID: Michelle Patton MRN: [id number] DOB/AGE: 10/01/1955 59 y.o.  Admit date: 10/07/2014 Discharge date: 10/11/2014  Admission Diagnoses:  Patient Active Problem List   Diagnosis Date Noted  . 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 Supravalvular aortic stenosis, congenital - s/p repair during childhood   . Essential hypertension   . Hyperlipidemia   . GERD (gastroesophageal reflux disease)   . Aortic stenosis, severe 07/20/2014  . Aortic regurgitation 07/20/2014  . Leg cramps 09/26/2012  . Occlusion and stenosis of carotid artery without mention of cerebral infarction 07/10/2012  . Peripheral vascular disease, unspecified 06/12/2012  . Carotid artery bruit 06/12/2012  . CONSTIPATION 12/28/2009  . NAUSEA AND VOMITING 12/28/2009  . DIARRHEA 12/28/2009   Discharge Diagnoses:   Patient Active Problem List   Diagnosis Date Noted  . 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 Supravalvular aortic stenosis, congenital - s/p repair during childhood   . Essential hypertension   . Hyperlipidemia   . GERD (gastroesophageal reflux disease)   . Aortic stenosis, severe 07/20/2014  . Aortic regurgitation 07/20/2014  . Leg cramps 09/26/2012  . Occlusion and stenosis of carotid artery without mention of cerebral infarction 07/10/2012  . Peripheral vascular disease, unspecified 06/12/2012  . Carotid artery bruit 06/12/2012  . CONSTIPATION 12/28/2009  . NAUSEA AND VOMITING 12/28/2009  . DIARRHEA 12/28/2009   Discharged Condition: good  History of Present Illness:  [name] is a 59 year old female with history of congenital supravalvular aortic stenosis S/P surgical repair in 1968 with past medical history otherwise notable for history of hypertension, hyperlipidemia, and peripheral vascular disease who has been referred for surgical consultation to discuss  treatment options for management of severe aortic stenosis. The patient states that she underwent open-heart surgery at age 59 for supravalvar aortic stenosis. At the time she lived in [place]. She was told that she had Teflon patch repair of her aorta and that her aortic valve did not need repair. To her recollection there was no mention of any other associated congenital heart defects including no ventricular septal defect nor coarctation of the aorta. She was not allowed to participate in sports during childhood but she otherwise has lived a normal and relatively healthy life ever since. He has been followed periodically with serial echocardiograms, most recently in 2011. She states that she has never been told that there was any sign of developing valve related problems until recently. Over the past 6 months the patient has developed progressive symptoms of exertional shortness of breath and chest discomfort. She underwent a follow-up transthoracic echocardiogram 06/10/2014 that revealed high velocity jet across the aortic valve measuring greater than 5 m/s suggestive of the presence of severe aortic stenosis or possible recurrence of supravalvar aortic stenosis. Left ventricular size and systolic function remained normal with ejection fraction estimated 55-60%. The patient subsequently underwent transesophageal echocardiogram on 07/20/2014 which demonstrated the presence of severe aortic stenosis with a tricuspid aortic valve that had moderate thickening and sclerosis involving all 3 leaflets of the valve. Peak velocity across aortic valve measured 5.5 m/s corresponding to a mean transvalvular gradient of 64 mmHg. By planimetry the aortic valve area was measured 0.9 cm. The aortic root appeared normal in size and there was no definite sign of residual supravalvular obstruction, although views were somewhat limited. There was moderate aortic insufficiency. Left and right heart catheterization  was performed 07/31/2014.  This confirmed the presence of severe aortic stenosis with peak-to-peak and mean transvalvular gradients measured 83 and 56 mmHg, respectively by catheterization. Aortic valve area was calculated at 0.52 cm. Pulmonary artery pressures were normal and there was no significant coronary artery disease. The right coronary artery was never directly engaged during catheterization because of unusual angulation from the aortic root, but the vessel did not appear to have significant coronary artery disease. A cardiac gated MRI was performed 08/27/2014 that revealed a trileaflet aortic valve that was abnormal in appearance with significant restriction to leaflet mobility. There was also concern about possible residual membrane or web in the aortic root consistent with residual supravalvular aortic stenosis. The patient was referred for surgical consultation.  During evaluation the patient stated she has gone down steadily over the past 6 months with progressive symptoms of exertional shortness of breath and chest discomfort. She states that she initially noted that she would get short of breath and fatigued with more strenuous activity such as walking at a brisk pain or going up a flight of stairs. Symptoms have progressed substantially such that the patient now gets short of breath with moderate activity, and this has caused her to have to cut back her daily activities significantly. 3 or 4 months ago she began to experience associated symptoms of exertional chest pain which she describes as a sharp achy pain that radiates across the left chest. Symptoms are always associated with stress or physical exertion and shortness of breath. Symptoms are usually relieved fairly quickly with resting, although she states that last week she had a prolonged episode of shortness of breath and chest discomfort after she had been vacuuming in her house. She has had some occasional mild dizzy spells without  syncope. She denies any history of resting shortness of breath, PND, orthopnea. She had some mild lower extremity edema that has resolved since her blood pressure medication was changed recently.  After review of her imaging studies it was felt she should undergo surgical intervention.  The risks and benefits of the procedure were explained to the patient and she was agreeable to proceed.  Prior to surgery she underwent CTA to assess for Aortic Root dilatation and this was found to be WNL.  She did get hospitalized on 2 separate occasions for exacerbations of chest tightness.  Hospital Course:   She presented to [facility] on 10/07/2014.  She underwent Aortic Valve Replacement with Root Replacement.  This was done utilizing a 21 mm Freestyle bioprosthetic porcine valve/aortic root graft.  She tolerated the procedure well and was taken to the SICU in stable condition.  She was extubated the evening of surgery. Furing her stay in the SICU the patient progressed without much difficulty.  She remained hemodynamically stable without the use of drips.  She was maintaining NSR.  Her chest tubes and arterial lines were removed without difficulty.  She was ambulating in the SICU and was transferred to the step down unit in stable condition.    The patient continued to make progress.  She continues to maintain NSR and her pacing wires were removed without difficulty.  She continues to be hemodynamically stable and is tolerating an ACE for BP control.  She continues to ambulate without difficulty.  She is tolerating heart healthy diet.  She is felt surgically stable for discharge today.       Significant Diagnostic Studies: cardiac graphics:   Echocardiogram:   - Left ventricle: The cavity size was normal. Wall thickness  was normal. Systolic function was normal. The estimated ejection fraction was in the range of 60% to 65%. Wall motion was normal; there were no regional wall motion abnormalities.  Doppler parameters are consistent with abnormal left ventricular relaxation (grade 1 diastolic dysfunction). - Aortic valve: Mildly calcified annulus. Trileaflet; moderately thickened leaflets. There appears to be partial prolapse of the aortic leaflets. There was severe stenosis. There was mild to moderate regurgitation. The AI VC is 0.3 cm. Peak velocity (S): 550.85 cm/s. Mean gradient (S): 64 mm Hg. Valve area (VTI): 0.75 cm^2. Valve area (Vmax): 0.66 cm^2. AVA by planimetry 0.9 cm squared. - Aortic root: The aortic root was normal in size. No evidence of supravalvular obstruction from available views. - Left atrium: No evidence of thrombus in the atrial cavity or appendage. No evidence of thrombus in the appendage. - Right atrium: No evidence of thrombus in the atrial cavity or appendage. - Atrial septum: No defect or patent foramen ovale was identified.  Impressions:  - Severe aortic stenosis. Patient with history of supravalvular aortic stenosis with patch repair roughly 50 years ago. Several views of the aortic root and proximal ascending aorta taken without evidence of supravalvular obstruction, appears to be valvular stenosis.  Treatments: surgery:    Redo Sternotomy for Aortic Root Replacement Enlargement of hypoplastic aortic root Medtronic Freestyle stentless porcine aortic root graft (size 21 mm, model # 841, serial # [id number])  Reimplantation of left main and right coronary arteries  Disposition: 01-Home or Self Care   Discharge medications:   Medication List    STOP taking these medications        cyclobenzaprine 10 MG tablet  Commonly known as:  FLEXERIL     ibuprofen 200 MG tablet  Commonly known as:  ADVIL,MOTRIN      TAKE these medications        aspirin 325 MG EC tablet  Take 1 tablet (325 mg total) by mouth daily.     CALCIUM 600+D3 600-800 MG-UNIT Tabs  Generic drug:  Calcium  Carb-Cholecalciferol  Take 1 tablet by mouth at bedtime.     clonazePAM 0.5 MG tablet  Commonly known as:  KLONOPIN  Take 1 tablet (0.5 mg total) by mouth at bedtime.     CRANBERRY PLUS VITAMIN C 4200-20-3 MG-MG-UNIT Caps  Generic drug:  Cranberry-Vitamin C-Vitamin E  Take 1 capsule by mouth at bedtime.     estradiol 1 MG tablet  Commonly known as:  ESTRACE  Take 1 mg by mouth at bedtime.     FLAXSEED OIL PO  Take 1 tablet by mouth at bedtime.     lisinopril 20 MG tablet  Commonly known as:  PRINIVIL,ZESTRIL  Take 20 mg by mouth at bedtime.     Magnesium 250 MG Tabs  Take 250 mg by mouth at bedtime.     medroxyPROGESTERone 2.5 MG tablet  Commonly known as:  PROVERA  Take 2.5 mg by mouth at bedtime.     metoprolol tartrate 25 MG tablet  Commonly known as:  LOPRESSOR  Take 1 tablet (25 mg total) by mouth 2 (two) times daily.     multivitamin with minerals tablet  Take 1 tablet by mouth at bedtime.     oxyCODONE 5 MG immediate release tablet  Commonly known as:  Oxy IR/ROXICODONE  Take 1-2 tablets (5-10 mg total) by mouth every 3 (three) hours as needed for severe pain.     Potassium 99 MG Tabs  Take 99 mg by mouth at  bedtime. OTC     RABEprazole 20 MG tablet  Commonly known as:  ACIPHEX  Take 20 mg by mouth at bedtime.     Vitamin D3 5000 UNITS Tabs  Take 5,000 Units by mouth at bedtime.     zolpidem 10 MG tablet  Commonly known as:  AMBIEN  Take 5 mg by mouth at bedtime as needed for sleep.       The patient has been discharged on:   1.Beta Blocker:  Yes [ x  ]                              No   [   ]                              If No, reason:  2.Ace Inhibitor/ARB: Yes [ x  ]                                     No  [    ]                                     If No, reason:  3.Statin:   Yes [   ]                  No  [ x  ]                  If No, reason: NO CAD  4.Shela CommonsVelta Addison  [  X ]                  No   [   ]                  If No,  reason:         Discharge Instructions    Amb Referral to Cardiac Rehabilitation    Complete by:  As directed   Congestive Heart Failure: If diagnosis is Heart Failure, patient MUST meet each of the CMS criteria: 1. Left Ventricular Ejection Fraction </= 35% 2. NYHA class II-IV symptoms despite being on optimal heart failure therapy for at least 6 weeks. 3. Stable = have not had a recent (<6 weeks) or planned (<6 months) major cardiovascular hospitalization or procedure  Program Details: - Physician supervised classes - 1-3 classes per week over a 12-18 week period, generally for a total of 36 sessions  Physician Certification: I certify that the above Cardiac Rehabilitation treatment is medically necessary and is medically approved by me for treatment of this patient. The patient is willing and cooperative, able to ambulate and medically stable to participate in exercise rehabilitation. The participant's progress and Individualized Treatment Plan will be reviewed by the Medical Director, Cardiac Rehab staff and as indicated by the Referring/Ordering Physician.  Diagnosis:  Valve Replacement/Repair           Follow-up Information    Follow up with Rexene Alberts, MD On 11/09/2014.   Specialty:  Cardiothoracic Surgery   Why:  Appointment is at 4:30   Contact information:   [address] [place] [address] [phone]       Follow up with  [facility] On 11/09/2014.   Why:  Please get CXR at 4:00   Contact information:   [facility]       Follow up with Carlyle Dolly, MD.   Specialty:  Cardiology   Why:  Call for a follow up for 2 weeks   Contact information:   [place] [address] [phone]       Signed: Nani Skillern PA-C 10/11/2014, 11:11 AM

## 2014-10-10 ENCOUNTER — Inpatient Hospital Stay (HOSPITAL_COMMUNITY): Payer: BLUE CROSS/BLUE SHIELD

## 2014-10-10 LAB — BASIC METABOLIC PANEL
Anion gap: 8 (ref 5–15)
BUN: 17 mg/dL (ref 6–20)
CALCIUM: 8.4 mg/dL — AB (ref 8.9–10.3)
CHLORIDE: 101 mmol/L (ref 101–111)
CO2: 28 mmol/L (ref 22–32)
CREATININE: 0.94 mg/dL (ref 0.44–1.00)
GFR calc Af Amer: >60 mL/min (ref >60)
GFR calc non Af Amer: >60 mL/min (ref >60)
Glucose, Bld: 99 mg/dL (ref 65–99)
POTASSIUM: 3.6 mmol/L (ref 3.5–5.1)
Sodium: 137 mmol/L (ref 135–145)

## 2014-10-10 LAB — CBC
HEMATOCRIT: 28.8 % — AB (ref 36.0–46.0)
Hemoglobin: 9.7 g/dL — ABNORMAL LOW (ref 12.0–15.0)
MCH: 29.8 pg (ref 26.0–34.0)
MCHC: 33.7 g/dL (ref 30.0–36.0)
MCV: 88.6 fL (ref 78.0–100.0)
Platelets: 156 10*3/uL (ref 150–400)
RBC: 3.25 MIL/uL — ABNORMAL LOW (ref 3.87–5.11)
RDW: 12 % (ref 11.5–15.5)
WBC: 12.6 10*3/uL — ABNORMAL HIGH (ref 4.0–10.5)

## 2014-10-10 MED ORDER — POTASSIUM CHLORIDE CRYS ER 20 MEQ PO TBCR
20.0000 meq | EXTENDED_RELEASE_TABLET | Freq: Once | ORAL | Status: AC
  Administered 2014-10-10: 20 meq via ORAL
  Filled 2014-10-10: qty 1

## 2014-10-10 MED ORDER — METOPROLOL TARTRATE 25 MG PO TABS
25.0000 mg | ORAL_TABLET | Freq: Two times a day (BID) | ORAL | Status: DC
  Administered 2014-10-10 – 2014-10-12 (×5): 25 mg via ORAL
  Filled 2014-10-10 (×6): qty 1

## 2014-10-10 NOTE — Progress Notes (Signed)
CARDIAC REHAB PHASE I   PRE:  Rate/Rhythm: 84 SR  BP:  Supine: 150/76  Sitting:   Standing:    SaO2: 90%RA  MODE:  Ambulation: 300 ft   POST:  Rate/Rhythm: 83  BP:  Supine: 167/80  Sitting:   Standing:    SaO2: 92%RA 0850-0911 Pt walked 300 ft on RA with hand held asst and tolerated well. Needed encouragement to walk. Encouraged walks with staff. Discussed CRP 2 and pt gave permission to refer to [facility]. Discussed sternal precautions.  To bed after walk.    Graylon Good, RN BSN  10/10/2014 9:35 AM

## 2014-10-10 NOTE — Progress Notes (Addendum)
      [address]       [place] [address]             [phone]        3 Days Post-Op Procedure(s) (LRB): REDO AORTIC VALVE REPLACEMENT (AVR) (N/A) TRANSESOPHAGEAL ECHOCARDIOGRAM (TEE) (N/A) ASCENDING AORTIC ROOT REPLACEMENT (N/A) REDO STERNOTOMY (N/A)  Subjective: She has a little bit of nausea this am. She states she is not taking narcotics and its from the Tylenol.  Objective: Vital signs in last 24 hours: Temp:  [97.5 F (36.4 C)-98.9 F (37.2 C)] 98.2 F (36.8 C) (06/18 0439) Pulse Rate:  [62-88] 83 (06/18 0439) Cardiac Rhythm:  [-] Normal sinus rhythm (06/17 2020) Resp:  [11-24] 18 (06/18 0439) BP: (137-175)/(50-77) 153/73 mmHg (06/18 0439) SpO2:  [85 %-98 %] 85 % (06/18 0439) Weight:  [202 lb 6.4 oz (91.808 kg)] 202 lb 6.4 oz (91.808 kg) (06/18 0439)  Pre op weight 89 kg Current Weight  10/10/14 202 lb 6.4 oz (91.808 kg)      Intake/Output from previous day: 06/17 0701 - 06/18 0700 In: 300 [P.O.:200; IV Piggyback:100] Out: 700 [Urine:700]   Physical Exam:  Cardiovascular: RRR, no murmur Pulmonary: Diminished at bases bilaterally; no rales, wheezes, or rhonchi. Abdomen: Soft, non tender, bowel sounds present. Extremities: Trace bilateral lower extremity edema. Wound: Aquacel intact. To be removed today.  Lab Results: CBC: Recent Labs  10/09/14 0440 10/10/14 0353  WBC 12.5* 12.6*  HGB 9.6* 9.7*  HCT 28.4* 28.8*  PLT 100* 156   BMET:  Recent Labs  10/09/14 0440 10/10/14 0353  NA 137 137  K 3.8 3.6  CL 102 101  CO2 29 28  GLUCOSE 112* 99  BUN 17 17  CREATININE 0.96 0.94  CALCIUM 8.1* 8.4*    PT/INR:  Lab Results  Component Value Date   INR 1.65* 10/07/2014   INR 1.11 10/05/2014   INR 1.08 07/31/2014   ABG:  INR: Will add last result for INR, ABG once components are confirmed Will add last 4 CBG results once components are confirmed  Assessment/Plan:  1. CV - SR in the 80-90's. On Lopressor 12.5 mg bid,  Lisinopril 10 mg daily. Hypertensive. Will increase Lopressor to 25 bid. May need to increase Lisinopril if BP not better controlled. 2.  Pulmonary - On room air. CXR appears to show no pneumothorax, low lung volumes, bilateral pleural effusions R>L, and atelectasis. Encourage incentive spirometer and flutter valve 3. Volume Overload - On Lasix 40 mg bid. Will decrease to daily in am 4.  Acute blood loss anemia - H and H stable at 9.7 and 28.8 5. Thrombocytopenia resolved as platelets up to 156,000 6. Supplement potassium 7. Remove EPW in am  ZIMMERMAN,DONIELLE MPA-C 10/10/2014,7:48 AM  patient examined and medical record reviewed,agree with above note. Tharon Aquas Trigt III 10/10/2014

## 2014-10-10 NOTE — Progress Notes (Signed)
10/10/2014 6:37 AM Pt has refused pain medicines the whole night and refuses to use oxygen through the canula.  She said before they made her feel sick. Michelle Patton

## 2014-10-10 NOTE — Progress Notes (Signed)
Pt has little motivation. She is refusing pain med as she is afraid that it will cause nausea. She has eaten little as every thing smells bad to her. I have encouraged her to increase her activity. She wants to lie in bed under covers, but I was finally able to convince her to get up to chair for lunch. She does not want any more diuretic today and she says that she will refuse it. I have explained the importance of being up, out of bed, using her IS and flutter valve and taking her medications as ordered in order to prevent post-op complications.

## 2014-10-10 NOTE — Progress Notes (Signed)
10/10/2014 12:05 AM Pt says she voided earlier this evening but no one witnessed it.  A bladder scan was done to make sure.  Bladder scan showed 92 mL in bladder.  Will continue to monitor the patient. Lupita Dawn, RN

## 2014-10-11 MED ORDER — ENOXAPARIN SODIUM 40 MG/0.4ML ~~LOC~~ SOLN
40.0000 mg | SUBCUTANEOUS | Status: DC
  Filled 2014-10-11: qty 0.4

## 2014-10-11 MED ORDER — FUROSEMIDE 40 MG PO TABS
40.0000 mg | ORAL_TABLET | Freq: Every day | ORAL | Status: DC
  Administered 2014-10-11: 40 mg via ORAL
  Filled 2014-10-11: qty 1

## 2014-10-11 MED ORDER — ENOXAPARIN SODIUM 30 MG/0.3ML ~~LOC~~ SOLN: 30.0000 mg | SUBCUTANEOUS | Status: DC

## 2014-10-11 MED ORDER — POTASSIUM CHLORIDE CRYS ER 20 MEQ PO TBCR
20.0000 meq | EXTENDED_RELEASE_TABLET | Freq: Every day | ORAL | Status: DC
  Administered 2014-10-11: 20 meq via ORAL
  Filled 2014-10-11 (×2): qty 1

## 2014-10-11 NOTE — Progress Notes (Addendum)
      [address]       [place] [address]             [phone]        4 Days Post-Op Procedure(s) (LRB): REDO AORTIC VALVE REPLACEMENT (AVR) (N/A) TRANSESOPHAGEAL ECHOCARDIOGRAM (TEE) (N/A) ASCENDING AORTIC ROOT REPLACEMENT (N/A) REDO STERNOTOMY (N/A)  Subjective: She is refusing Lasix as she "has peed enough". She asked that stool softeners be stopped.  Objective: Vital signs in last 24 hours: Temp:  [98.7 F (37.1 C)-99.1 F (37.3 C)] 98.8 F (37.1 C) (06/19 0352) Pulse Rate:  [67-82] 67 (06/19 0352) Cardiac Rhythm:  [-] Normal sinus rhythm (06/19 0758) Resp:  [18] 18 (06/19 0352) BP: (134-152)/(54-74) 145/68 mmHg (06/19 0352) SpO2:  [95 %-97 %] 97 % (06/19 0352) Weight:  [196 lb 12.8 oz (89.268 kg)] 196 lb 12.8 oz (89.268 kg) (06/19 0352)  Pre op weight 89 kg Current Weight  10/11/14 196 lb 12.8 oz (89.268 kg)      Intake/Output from previous day: 06/18 0701 - 06/19 0700 In: 56 [P.O.:580] Out: 2153 [Urine:2150; Stool:3]   Physical Exam:  Cardiovascular: RRR, no murmur Pulmonary: Diminished at bases bilaterally; no rales, wheezes, or rhonchi. Abdomen: Soft, non tender, bowel sounds present. Extremities: Trace bilateral lower extremity edema. Wound: Clean and dry. No signs of infection.  Lab Results: CBC:  Recent Labs  10/09/14 0440 10/10/14 0353  WBC 12.5* 12.6*  HGB 9.6* 9.7*  HCT 28.4* 28.8*  PLT 100* 156   BMET:   Recent Labs  10/09/14 0440 10/10/14 0353  NA 137 137  K 3.8 3.6  CL 102 101  CO2 29 28  GLUCOSE 112* 99  BUN 17 17  CREATININE 0.96 0.94  CALCIUM 8.1* 8.4*    PT/INR:  Lab Results  Component Value Date   INR 1.65* 10/07/2014   INR 1.11 10/05/2014   INR 1.08 07/31/2014   ABG:  INR: Will add last result for INR, ABG once components are confirmed Will add last 4 CBG results once components are confirmed  Assessment/Plan:  1. CV - SR in the 70's. On Lopressor 25 mg bid, Lisinopril 10 mg  daily.  2.  Pulmonary - On room air.  Encourage incentive spirometer and flutter valve 3. Volume Overload - Previously given Lasix 40 mg bid. Now on Lasix 40 mg daily, although patient has been refusing. 4.  Acute blood loss anemia - H and H stable at 9.7 and 28.8 5. Thrombocytopenia resolved as platelets up to 156,000 6. Remove EPW  7. Stop stool softeners 8. Possible discharge HOME in am  ZIMMERMAN,DONIELLE MPA-C 10/11/2014,8:31 AM  Incision clean No murmur, nsr patient examined and medical record reviewed,agree with above note. Tharon Aquas Trigt III 10/11/2014

## 2014-10-11 NOTE — Progress Notes (Signed)
Pt agreed during the night to try some pain medicine and she is in better spirits today, more interactive, although she is still refusing Lasix- "point blank no more pee pills". I have explained the benefits of this medication. She has no peripheral edema. She does have diminished breath sounds in the RLL. No shortness of breath. IS and flutter valve encouraged.

## 2014-10-11 NOTE — Progress Notes (Signed)
Epicardial pacing wires removed per protocol. Pt tolerated well. VS and rhythm stable. Pt on bedrest for one hour with monitoring of VS.

## 2014-10-11 NOTE — Progress Notes (Signed)
Pt ambulated with standby, 350 feet in hall. She tolerated the walk well. Back to bed for Epicardial wire removal.

## 2014-10-12 MED ORDER — OXYCODONE HCL 5 MG PO TABS: 5.0000 mg | ORAL_TABLET | ORAL | Status: DC | PRN

## 2014-10-12 MED ORDER — METOPROLOL TARTRATE 25 MG PO TABS: 25.0000 mg | ORAL_TABLET | Freq: Two times a day (BID) | ORAL | Status: DC

## 2014-10-12 MED ORDER — ASPIRIN 325 MG PO TBEC: 325.0000 mg | DELAYED_RELEASE_TABLET | Freq: Every day | ORAL | Status: DC

## 2014-10-12 NOTE — Progress Notes (Signed)
Utilization review completed.  

## 2014-10-12 NOTE — Progress Notes (Signed)
Discharge Med Rec Note: Carnelia, Oscar  The following medications were initiated, discontinued, or adjusted during Ms. Harper's stay for bioprosthetic aortic valve replacement/aortic root graft on 10/07/2014.  Initiated: Metoprolol 25 mg PO BID. This medication was initiated for better blood pressure control during Ms. Harper's postoperative stay.   Oxycodone 5 mg PO prn for severe pain. Ms. Jodi Mourning was discharged with a prescription for this medication for as needed use of severe postoperative pain.   Adjusted: Aspirin 81 mg PO daily increased to 325 mg daily. In the setting of a bioprosthetic AVR and aortic root graft, this medication was increased for a reduction in the risk of thromboembolic events including stroke.  Discontinued: Ibuprofen 400 mg PO QID PRN pain. Ms. Jodi Mourning reported taking ibuprofen for pain prior to admission. In the setting of concomitant aspirin use increasing the risk for bleeding and potential cardiovascular risk, she was advised NOT to take this medication any further. She was informed to take Tylenol for aches and pains after her opiate prescription runs out.   Meds were reconciled in Epic and patient was counseled on date of discharge.  Ruta Hinds. Velva Harman, PharmD, [place] Clinical Pharmacist - Resident Pager: [phone] Pharmacy: [phone] 10/12/2014 8:55 AM

## 2014-10-12 NOTE — Progress Notes (Signed)
Pt anxious to d/c. Ed completed/reviewed. Voiced understanding. Already signed up for [facility] in [place] but might need to wait till she can drive to start. [place], ACSM 9:23 AM 10/12/2014

## 2014-10-12 NOTE — Progress Notes (Signed)
10/12/2014 10:48 AM D/c avs form, medications already taken today and those due this evening given and explained to patient. Follow up appointments and when to call MD reviewed. Incision site care, activity restrictions and when to call MD reviewed. RX reviewed. D/c iv. D/c tele. D/c home per orders. CTS d/c per telephone order [facility]. Benzoin and steri strips applied per orders. Pt. Tolerated well.  Kacee Koren, Arville Lime

## 2014-10-12 NOTE — Progress Notes (Signed)
      [address]       [place] [address]             [phone]      5 Days Post-Op Procedure(s) (LRB): REDO AORTIC VALVE REPLACEMENT (AVR) (N/A) TRANSESOPHAGEAL ECHOCARDIOGRAM (TEE) (N/A) ASCENDING AORTIC ROOT REPLACEMENT (N/A) REDO STERNOTOMY (N/A) Subjective: Doing well  Objective: Vital signs in last 24 hours: Temp:  [97.9 F (36.6 C)-98.8 F (37.1 C)] 97.9 F (36.6 C) (06/19 2015) Pulse Rate:  [62-75] 65 (06/19 2015) Cardiac Rhythm:  [-] Normal sinus rhythm (06/19 2000) Resp:  [18] 18 (06/19 2015) BP: (117-144)/(51-76) 129/51 mmHg (06/19 2015) SpO2:  [95 %-98 %] 95 % (06/19 2015)  Hemodynamic parameters for last 24 hours:    Intake/Output from previous day: 06/19 0701 - 06/20 0700 In: 720 [P.O.:720] Out: 1900 [Urine:1900] Intake/Output this shift:    General appearance: alert, cooperative and no distress Heart: regular rate and rhythm and soft systolic murmur Lungs: mildly dim in bases Abdomen: benign Extremities: no edema Wound: incis healing well  Lab Results:  Recent Labs  10/10/14 0353  WBC 12.6*  HGB 9.7*  HCT 28.8*  PLT 156   BMET:  Recent Labs  10/10/14 0353  NA 137  K 3.6  CL 101  CO2 28  GLUCOSE 99  BUN 17  CREATININE 0.94  CALCIUM 8.4*    PT/INR: No results for input(s): LABPROT, INR in the last 72 hours. ABG    Component Value Date/Time   PHART 7.322* 10/08/2014 0302   HCO3 22.4 10/08/2014 0302   TCO2 24 10/08/2014 1718   ACIDBASEDEF 3.0* 10/08/2014 0302   O2SAT 98.0 10/08/2014 0302   CBG (last 3)  No results for input(s): GLUCAP in the last 72 hours.  Meds Scheduled Meds: . acetaminophen (TYLENOL) oral liquid 160 mg/5 mL  650 mg Per Tube Once  . acetaminophen  1,000 mg Oral 4 times per day  . antiseptic oral rinse  7 mL Mouth Rinse QID  . aspirin EC  325 mg Oral Daily  . chlorhexidine  15 mL Mouth Rinse BID  . Chlorhexidine Gluconate Cloth  6 each Topical Q0600  . clonazePAM  0.5 mg Oral QHS   . enoxaparin (LOVENOX) injection  40 mg Subcutaneous Q24H  . furosemide  40 mg Oral Daily  . lisinopril  10 mg Oral Daily  . metoprolol tartrate  25 mg Oral BID  . pantoprazole  40 mg Oral Daily  . potassium chloride  20 mEq Oral Daily  . sodium chloride  3 mL Intravenous Q12H   Continuous Infusions:  PRN Meds:.sodium chloride, metoprolol, ondansetron (ZOFRAN) IV, oxyCODONE, sodium chloride, traMADol  Xrays No results found.  Assessment/Plan: S/P Procedure(s) (LRB): REDO AORTIC VALVE REPLACEMENT (AVR) (N/A) TRANSESOPHAGEAL ECHOCARDIOGRAM (TEE) (N/A) ASCENDING AORTIC ROOT REPLACEMENT (N/A) REDO STERNOTOMY (N/A) Plan for discharge: see discharge orders   LOS: 5 days    Michelle Patton 10/12/2014

## 2014-10-13 LAB — TYPE AND SCREEN
ABO/RH(D): A POS
Antibody Screen: NEGATIVE
UNIT DIVISION: 0
Unit division: 0

## 2014-10-14 ENCOUNTER — Telehealth: Payer: Self-pay

## 2014-10-14 MED ORDER — PROMETHAZINE HCL 12.5 MG PO TABS: 12.5000 mg | ORAL_TABLET | Freq: Four times a day (QID) | ORAL | Status: DC | PRN

## 2014-10-14 NOTE — Telephone Encounter (Signed)
RX for Promethazine 12.5 mg 1 tab every 6 hours prn for nausea called to [facility] in [place]

## 2014-10-19 ENCOUNTER — Other Ambulatory Visit: Payer: Self-pay

## 2014-10-19 DIAGNOSIS — G8918 Other acute postprocedural pain: Secondary | ICD-10-CM

## 2014-10-19 MED ORDER — TRAMADOL HCL 50 MG PO TABS: 50.0000 mg | ORAL_TABLET | Freq: Four times a day (QID) | ORAL | Status: DC | PRN

## 2014-10-19 NOTE — Telephone Encounter (Signed)
RX for Tramadol fax to [facility]

## 2014-10-20 ENCOUNTER — Other Ambulatory Visit: Payer: Self-pay

## 2014-10-20 DIAGNOSIS — G8918 Other acute postprocedural pain: Secondary | ICD-10-CM

## 2014-10-20 MED ORDER — TRAMADOL HCL 50 MG PO TABS: 50.0000 mg | ORAL_TABLET | Freq: Four times a day (QID) | ORAL | Status: DC | PRN

## 2014-10-20 NOTE — Telephone Encounter (Signed)
RE FAXed Tramadol RX to [facility] at [phone]

## 2014-10-28 ENCOUNTER — Ambulatory Visit (HOSPITAL_COMMUNITY)
Admission: RE | Admit: 2014-10-28 | Discharge: 2014-10-28 | Disposition: A | Discharge status: Home / Self Care | Payer: BLUE CROSS/BLUE SHIELD | Source: Ambulatory Visit | Attending: Cardiology | Admitting: Cardiology

## 2014-10-28 ENCOUNTER — Ambulatory Visit (INDEPENDENT_AMBULATORY_CARE_PROVIDER_SITE_OTHER): Payer: BLUE CROSS/BLUE SHIELD | Admitting: Cardiology

## 2014-10-28 VITALS — BP 118/73 | HR 61 | Ht 66.0 in | Wt 195.0 lb

## 2014-10-28 DIAGNOSIS — R05 Cough: Secondary | ICD-10-CM | POA: Insufficient documentation

## 2014-10-28 DIAGNOSIS — R059 Cough, unspecified: Secondary | ICD-10-CM

## 2014-10-28 DIAGNOSIS — R0781 Pleurodynia: Secondary | ICD-10-CM

## 2014-10-28 DIAGNOSIS — I350 Nonrheumatic aortic (valve) stenosis: Secondary | ICD-10-CM | POA: Diagnosis not present

## 2014-10-28 DIAGNOSIS — R079 Chest pain, unspecified: Secondary | ICD-10-CM | POA: Diagnosis not present

## 2014-10-28 DIAGNOSIS — I351 Nonrheumatic aortic (valve) insufficiency: Secondary | ICD-10-CM | POA: Diagnosis not present

## 2014-10-28 DIAGNOSIS — Z952 Presence of prosthetic heart valve: Secondary | ICD-10-CM | POA: Diagnosis not present

## 2014-10-28 MED ORDER — AZITHROMYCIN 250 MG PO TABS: ORAL_TABLET | ORAL | Status: DC

## 2014-10-28 NOTE — Progress Notes (Signed)
Clinical Summary [name] is a 59 y.o.female seen today for follow up of the following medical problems.   1. Aortic stenosis - she reports hx in 1968 at age of 16 of supravavlular aortic stenosis, corrective surgery with patching at that time.  - recently found to have developed severe aortic valvular stenosis with possible recurrence of supravalvular stenosis as well based on imaging. 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  - Medtronic Freestyle stentless porcine valve/aortic root graft (size 21 mm, model # 995, serial # [id number])  - since discharge denies any significant SOB/DOE or LE edema.    2. PAD - followed by vascular - hx of left external iliac stent  3. Cough - yellowish sputum, mild subjective fevers. Ongoing 7-10 days.  4. Left rib pain - worst with movement. Worst in AM, tends to loosen up in the day. Better with prn tramadol. Ongoing 7 days.  Past Medical History  Diagnosis Date  . Hypertension   . Hyperlipidemia   . GERD (gastroesophageal reflux disease)   . DVT (deep venous thrombosis)   . Insomnia, unspecified   . Muscle weakness (generalized)   . Peripheral artery disease   . Aortic stenosis, severe 07/20/2014  . Aortic regurgitation 07/20/2014  . Supravalvular aortic stenosis, congenital - s/p repair during childhood   . Family history of adverse reaction to anesthesia     Reports father deliurm in his 71's with CABG  . History of pneumonia   . Sleep apnea     diagnosed multiple [facility] at [facility]  . 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 Penicillins Hives    Have taken Keflex before without any adverse reaction.   . Sulfa  Antibiotics Nausea And Vomiting     Current Outpatient Prescriptions  Medication Sig Dispense Refill  . aspirin EC 325 MG EC tablet Take 1 tablet (325 mg total) by mouth daily. 30 tablet 0  . Calcium Carb-Cholecalciferol (CALCIUM 600+D3) 600-800 MG-UNIT TABS Take 1 tablet by mouth at bedtime.    . Cholecalciferol (VITAMIN D3) 5000 UNITS TABS Take 5,000 Units by mouth at bedtime.    . clonazePAM (KLONOPIN) 0.5 MG tablet Take 1 tablet (0.5 mg total) by mouth at bedtime. 30 tablet 5  . Cranberry-Vitamin C-Vitamin E (CRANBERRY PLUS VITAMIN C) 4200-20-3 MG-MG-UNIT CAPS Take 1 capsule by mouth at bedtime.    [name] estradiol (ESTRACE) 1 MG tablet Take 1 mg by mouth at bedtime.    . Flaxseed, Linseed, (FLAXSEED OIL PO) Take 1 tablet by mouth at bedtime.     [name] lisinopril (PRINIVIL,ZESTRIL) 20 MG tablet Take 20 mg by mouth at bedtime.     . Magnesium 250 MG TABS Take 250 mg by mouth at bedtime.    . medroxyPROGESTERone (PROVERA) 2.5 MG tablet Take 2.5 mg by mouth at bedtime.    . metoprolol tartrate (LOPRESSOR) 25 MG tablet Take 1 tablet (25 mg total) by mouth 2 (two) times daily. 60 tablet 1  . Multiple Vitamins-Minerals (MULTIVITAMIN WITH MINERALS) tablet Take 1 tablet by mouth at bedtime.     [name] oxyCODONE (OXY IR/ROXICODONE) 5 MG immediate release tablet Take 1-2 tablets (5-10 mg total)  by mouth every 3 (three) hours as needed for severe pain. 50 tablet 0  . Potassium 99 MG TABS Take 99 mg by mouth at bedtime. OTC    . promethazine (PHENERGAN) 12.5 MG tablet Take 1 tablet (12.5 mg total) by mouth every 6 (six) hours as needed for nausea. 30 tablet 0  . RABEprazole (ACIPHEX) 20 MG tablet Take 20 mg by mouth at bedtime.     . traMADol (ULTRAM) 50 MG tablet Take 1 tablet (50 mg total) by mouth every 6 (six) hours as needed for severe pain. 40 tablet 0  . zolpidem (AMBIEN) 10 MG tablet Take 5 mg by mouth at bedtime as needed for sleep.      No current facility-administered medications for this visit.      Past Surgical History  Procedure Laterality Date  . Iliac artery stent Left 12/2007  . Cardiac valve surgery  1968  . Cholecystectomy    . Cervical fusion    . Rotator cuff repair Bilateral   . Abdominal aortagram  06/24/12  . Abdominal aortagram N/A 06/24/2012    Procedure: ABDOMINAL Maxcine Ham;  Surgeon: Angelia Mould, MD;  Location: [facility] CATH LAB;  Service: Cardiovascular;  Laterality: N/A;  . Tee without cardioversion N/A 07/07/2014    Procedure: TRANSESOPHAGEAL ECHOCARDIOGRAM (TEE);  Surgeon: Arnoldo Lenis, MD;  Location: [facility] ENDO SUITE;  Service: Cardiology;  Laterality: N/A;  . Tee without cardioversion N/A 07/20/2014    Procedure: TRANSESOPHAGEAL ECHOCARDIOGRAM (TEE) WITH PROPOFOL;  Surgeon: Arnoldo Lenis, MD;  Location: [facility] ORS;  Service: Endoscopy;  Laterality: N/A;  . Left and right heart catheterization with coronary angiogram N/A 07/31/2014    Procedure: LEFT AND RIGHT HEART CATHETERIZATION WITH CORONARY ANGIOGRAM;  Surgeon: Burnell Blanks, MD;  Location: [facility] CATH LAB;  Service: Cardiovascular;  Laterality: N/A;  . Aortic valve replacement N/A 10/07/2014    Procedure: REDO AORTIC VALVE REPLACEMENT (AVR);  Surgeon: Rexene Alberts, MD;  Location: [place];  Service: Open Heart Surgery;  Laterality: N/A;  . Tee without cardioversion N/A 10/07/2014    Procedure: TRANSESOPHAGEAL ECHOCARDIOGRAM (TEE);  Surgeon: Rexene Alberts, MD;  Location: [place];  Service: Open Heart Surgery;  Laterality: N/A;  . Ascending aortic root replacement N/A 10/07/2014    Procedure: ASCENDING AORTIC ROOT REPLACEMENT;  Surgeon: Rexene Alberts, MD;  Location: [place];  Service: Open Heart Surgery;  Laterality: N/A;     Allergies  Allergen Reactions  . Penicillins Hives    Have taken Keflex before without any adverse reaction.   . Sulfa Antibiotics Nausea And Vomiting      Family History  Problem Relation Age of Onset  . Hypertension Mother   . Hypertension Father   . Heart disease  Father     before age 68  . Other Father     varicose veins     Social History [name] reports that she has been smoking Cigarettes.  She has been smoking about 0.75 packs per day. She has never used smokeless tobacco. [name] reports that she drinks about 2.4 oz of alcohol per week.   Review of Systems CONSTITUTIONAL: No weight loss, fever, chills, weakness or fatigue.  HEENT: Eyes: No visual loss, blurred vision, double vision or yellow sclerae.No hearing loss, sneezing, congestion, runny nose or sore throat.  SKIN: No rash or itching.  CARDIOVASCULAR: per HPI RESPIRATORY: No shortness of breath, cough or sputum.  GASTROINTESTINAL: No anorexia, nausea, vomiting or diarrhea. No abdominal pain or  blood.  GENITOURINARY: No burning on urination, no polyuria NEUROLOGICAL: No headache, dizziness, syncope, paralysis, ataxia, numbness or tingling in the extremities. No change in bowel or bladder control.  MUSCULOSKELETAL: left rib pain  LYMPHATICS: No enlarged nodes. No history of splenectomy.  PSYCHIATRIC: No history of depression or anxiety.  ENDOCRINOLOGIC: No reports of sweating, cold or heat intolerance. No polyuria or polydipsia.  [name]   Physical Examination Filed Vitals:   10/28/14 1501  BP: 118/73  Pulse: 61   Filed Vitals:   10/28/14 1501  Height: 5\' 6"  (1.676 m)  Weight: 195 lb (88.451 kg)    Gen: resting comfortably, no acute distress HEENT: no scleral icterus, pupils equal round and reactive, no palptable cervical adenopathy,  CV: RRR, 2/6 systolic murmur RUSB, no JVD, no carotid bruits Resp: Clear to auscultation bilaterally GI: abdomen is soft, non-tender, non-distended, normal bowel sounds, no hepatosplenomegaly MSK: extremities are warm, no edema.  Skin: warm, no rash Neuro:  no focal deficits Psych: appropriate affect     Assessment and Plan  1. Aortic stenosis - s/p tissue AVR and aortic root graft placement - recovering well, will be starting  cardaic rehab soon - continue to follow clincally.   2. PAD - continue to follow with vascular  3. Cough - symptoms suggestive of bacterial URI, will treat with 5 day course of azithromycin  4. Left rib pain - likely related to recent CABG, potential inflammation - will obtain CXR - continue prn tramadol  Arnoldo Lenis, M.D.

## 2014-10-28 NOTE — Patient Instructions (Signed)
   Begin Z-pack - sent to pharmacy today. Continue all other medications.   Chest x-ray  Office will contact with results via phone or letter.   Follow up in  3 months

## 2014-10-29 ENCOUNTER — Encounter: Payer: Self-pay | Admitting: Cardiology

## 2014-10-30 ENCOUNTER — Telehealth: Payer: Self-pay | Admitting: *Deleted

## 2014-10-30 NOTE — Telephone Encounter (Signed)
-----   Message from Arnoldo Lenis, MD sent at 10/29/2014 10:59 AM EDT ----- Overall chest xray looks ok, changes to be expected from her recent surgery. Continue to follow her rib pain symptoms on antibiotic and prn tramadol  [name] MD

## 2014-10-30 NOTE — Telephone Encounter (Signed)
Notes Recorded by Laurine Blazer, LPN on 08/28/8976 at 4:78 PM Patient notified and verbalized understanding. Result fwd to pmd.

## 2014-11-02 ENCOUNTER — Other Ambulatory Visit: Payer: Self-pay | Admitting: *Deleted

## 2014-11-02 DIAGNOSIS — G8918 Other acute postprocedural pain: Secondary | ICD-10-CM

## 2014-11-02 MED ORDER — TRAMADOL HCL 50 MG PO TABS: 50.0000 mg | ORAL_TABLET | Freq: Four times a day (QID) | ORAL | Status: DC | PRN

## 2014-11-02 NOTE — Telephone Encounter (Signed)
[name] has called for a refill for Tramadol s/p cardiac surgery 10/07/14. A new signed script was faxed to her pharmacy and she is aware.

## 2014-11-03 ENCOUNTER — Encounter (HOSPITAL_COMMUNITY): Payer: BLUE CROSS/BLUE SHIELD

## 2014-11-04 ENCOUNTER — Encounter (HOSPITAL_COMMUNITY)
Admission: RE | Admit: 2014-11-04 | Discharge: 2014-11-04 | Disposition: A | Discharge status: Home / Self Care | Payer: BLUE CROSS/BLUE SHIELD | Source: Ambulatory Visit | Attending: Cardiology | Admitting: Cardiology

## 2014-11-04 VITALS — BP 98/58 | HR 66 | Ht 65.0 in | Wt 192.4 lb

## 2014-11-04 DIAGNOSIS — Z952 Presence of prosthetic heart valve: Secondary | ICD-10-CM

## 2014-11-04 DIAGNOSIS — I350 Nonrheumatic aortic (valve) stenosis: Secondary | ICD-10-CM | POA: Insufficient documentation

## 2014-11-04 DIAGNOSIS — Q230 Congenital stenosis of aortic valve: Secondary | ICD-10-CM

## 2014-11-04 NOTE — Progress Notes (Signed)
Cardiac/Pulmonary Rehab Medication Review by a Pharmacist  Does the patient  feel that his/her medications are working for him/her?  yes  Has the patient been experiencing any side effects to the medications prescribed?  yes  Does the patient measure his/her own blood pressure or blood glucose at home?  yes   Does the patient have any problems obtaining medications due to transportation or finances?   no  Understanding of regimen: good Understanding of indications: good Potential of compliance: good  Questions asked to Determine Patient Understanding of Medication Regimen:  1. What is the name of the medication?  2. What is the medication used for?  3. When should it be taken?  4. How much should be taken?  5. How will you take it?  6. What side effects should you report?  Understanding Defined as: Excellent: All questions above are correct Good: Questions 1-4 are correct Fair: Questions 1-2 are correct  Poor: 1 or none of the above questions are correct   Pharmacist comments: Pt states she has some trouble sleeping at night.  Trying to minimize taking Ambien but can't sleep without it.  Pt doing OK with Klonopin which she takes for leg cramps.  She states she feels a bit tired and sluggish and relates that to the Lopressor.  We discussed asking the MD to talk about using Toprol once daily at next appointment.  Hart Robinsons A 11/04/2014 3:38 PM

## 2014-11-04 NOTE — Progress Notes (Signed)
Patient arrived at 2:30pm for 1st visit/orientation/education. Patient was referred by Dr. Harl Bowie post Aortic Valve Replacement Z95.4 due to Aortic stenosis (congenital) Q23.0. During orientation/education advised patient on arrival and appointment times what to wear, what to do before, during and after exercise. Reviewed attendance and class policy. Talked about inclement weather and class consultation policy. Pt is scheduled to return to Cardiac Rehab on 11/16/14 at 8:15. Pt was advised to come to class 5 minutes before class starts. He was also given instructions on meeting with the dietician and attending the Family Structure classes. Pt is eager to get started. Patient was able to complete 6 minute walk test. Patient was measured for the equipment. Discussed equipment safety. Pre body measurements were taken. Patient finished 1st visit at 4:45pm

## 2014-11-06 ENCOUNTER — Other Ambulatory Visit: Payer: Self-pay | Admitting: Thoracic Surgery (Cardiothoracic Vascular Surgery)

## 2014-11-06 DIAGNOSIS — Z952 Presence of prosthetic heart valve: Secondary | ICD-10-CM

## 2014-11-09 ENCOUNTER — Ambulatory Visit: Payer: Self-pay | Admitting: Thoracic Surgery (Cardiothoracic Vascular Surgery)

## 2014-11-11 ENCOUNTER — Ambulatory Visit (INDEPENDENT_AMBULATORY_CARE_PROVIDER_SITE_OTHER): Payer: Self-pay | Admitting: Thoracic Surgery (Cardiothoracic Vascular Surgery)

## 2014-11-11 ENCOUNTER — Encounter: Payer: Self-pay | Admitting: Thoracic Surgery (Cardiothoracic Vascular Surgery)

## 2014-11-11 ENCOUNTER — Ambulatory Visit
Admission: RE | Admit: 2014-11-11 | Discharge: 2014-11-11 | Disposition: A | Discharge status: Home / Self Care | Payer: BLUE CROSS/BLUE SHIELD | Source: Ambulatory Visit | Attending: Thoracic Surgery (Cardiothoracic Vascular Surgery) | Admitting: Thoracic Surgery (Cardiothoracic Vascular Surgery)

## 2014-11-11 VITALS — BP 97/66 | HR 61 | Resp 20 | Ht 65.0 in | Wt 192.0 lb

## 2014-11-11 DIAGNOSIS — Z952 Presence of prosthetic heart valve: Secondary | ICD-10-CM

## 2014-11-11 DIAGNOSIS — G8918 Other acute postprocedural pain: Secondary | ICD-10-CM

## 2014-11-11 DIAGNOSIS — I350 Nonrheumatic aortic (valve) stenosis: Secondary | ICD-10-CM

## 2014-11-11 DIAGNOSIS — Q230 Congenital stenosis of aortic valve: Secondary | ICD-10-CM

## 2014-11-11 DIAGNOSIS — Z954 Presence of other heart-valve replacement: Secondary | ICD-10-CM

## 2014-11-11 DIAGNOSIS — I351 Nonrheumatic aortic (valve) insufficiency: Secondary | ICD-10-CM

## 2014-11-11 MED ORDER — TRAMADOL HCL 50 MG PO TABS: 50.0000 mg | ORAL_TABLET | Freq: Two times a day (BID) | ORAL | Status: DC | PRN

## 2014-11-11 NOTE — Progress Notes (Signed)
[address]       [place] [address]             [phone]     CARDIOTHORACIC SURGERY OFFICE NOTE  Referring Provider is Branch, Alphonse Guild, MD PCP is Octavio Graves, DO   HPI:  Patient returns for routine follow-up status post redo sternotomy for aortic root replacement using a Medtronic free style stentless porcine aortic root graft on 10/07/2014 for severe aortic stenosis status post patch repair for congenital supravalvular aortic stenosis in the remote past. The patient's early postoperative recovery was entirely uncomplicated and she was discharged from the hospital on the fifth postoperative day. Since hospital discharge the patient has continued to do very well clinically.  She has been seen in follow-up by Dr. Harl Bowie on 10/28/2014 and she returns to our office for routine follow-up today. The patient complains of continued soreness across her chest, although she admits that this has gradually improved since hospital discharge. She is using tramadol 2 or 3 times a day as needed for pain. She has not used any over-the-counter pain relievers. She otherwise admits that she is doing remarkably well. She states that her breathing is already much better than it was prior to surgery. She no longer "hits the wall" when she is trying to do something physical. She is walking frequently and denies any exertional shortness of breath. Her appetite is gradually improved.  She has no other specific complaints.   Current Outpatient Prescriptions  Medication Sig Dispense Refill  . aspirin EC 325 MG EC tablet Take 1 tablet (325 mg total) by mouth daily. 30 tablet 0  . Calcium Carb-Cholecalciferol (CALCIUM 600+D3) 600-800 MG-UNIT TABS Take 1 tablet by mouth at bedtime.    . Cholecalciferol (VITAMIN D3) 5000 UNITS TABS Take 5,000 Units by mouth at bedtime.    . clonazePAM (KLONOPIN) 0.5 MG tablet Take 1 tablet (0.5 mg total) by mouth at bedtime. (Patient taking differently:  Take 0.5 mg by mouth 2 (two) times daily. ) 30 tablet 5  . Cranberry-Vitamin C-Vitamin E (CRANBERRY PLUS VITAMIN C) 4200-20-3 MG-MG-UNIT CAPS Take 1 capsule by mouth at bedtime.    [name] estradiol (ESTRACE) 1 MG tablet Take 1 mg by mouth at bedtime.    [name] lisinopril (PRINIVIL,ZESTRIL) 20 MG tablet Take 20 mg by mouth at bedtime.     . Magnesium 250 MG TABS Take 250 mg by mouth at bedtime.    . medroxyPROGESTERone (PROVERA) 2.5 MG tablet Take 2.5 mg by mouth at bedtime.    . metoprolol tartrate (LOPRESSOR) 25 MG tablet Take 1 tablet (25 mg total) by mouth 2 (two) times daily. 60 tablet 1  . Multiple Vitamins-Minerals (MULTIVITAMIN WITH MINERALS) tablet Take 1 tablet by mouth at bedtime.     . Potassium 99 MG TABS Take 99 mg by mouth at bedtime. OTC    . RABEprazole (ACIPHEX) 20 MG tablet Take 20 mg by mouth at bedtime.     . traMADol (ULTRAM) 50 MG tablet Take 1 tablet (50 mg total) by mouth every 12 (twelve) hours as needed for severe pain. 40 tablet 0  . zolpidem (AMBIEN) 10 MG tablet Take 5 mg by mouth at bedtime as needed for sleep.      No current facility-administered medications for this visit.      Physical Exam:   BP 97/66 mmHg  Pulse 61  Resp 20  Ht 5\' 5"  (1.651 m)  Wt 192 lb (87.091 kg)  BMI 31.95 kg/m2  SpO2 98%  General:   well-appearing  Chest:   clear  CV:   Regular rate and rhythm without murmur  Incisions:  Healing nicely, sternum is stable  Abdomen:  Soft and nontender  Extremities:  Warm and well-perfused  Diagnostic Tests:  CHEST 2 VIEW  COMPARISON: PA and lateral chest x-ray of October 28, 2014.  FINDINGS: The right lung is clear. The the pleural effusions have resolved. The left lung exhibits only minimal remaining subsegmental atelectasis. The cardiac silhouette is top-normal in size. The pulmonary vascularity is normal. The mediastinum is normal in width. There are 8 intact sternal wires. The retrosternal soft tissues are normal. There is mild multilevel  degenerative disc space narrowing.  IMPRESSION: Continued improvement in the appearance of the chest with interval clearing of the right lung and decreased subsegmental atelectasis on the left. The pleural effusions have resolved.   Electronically Signed  By: David Martinique M.D.  On: 11/11/2014 11:28   Impression:  Patient is doing very well approximately one month following redo aortic root replacement   Plan:  I have encouraged patient to continue to gradually increase her physical activity as tolerated with her primary limitation remaining that she refrain from any sort of heavy lifting or strenuous use of her arms or shoulders for least another 2 months. This means that she should not be riding a motorcycle.  I have encouraged her to participate in outpatient cardiac rehabilitation program. Once she is no longer using oral narcotic pain relievers she may resume driving an automobile. We have not recommended any changes in her medications at this time.  The patient will return in 3 months for routine follow-up.   Valentina Gu. Roxy Manns, MD 11/11/2014 12:55 PM

## 2014-11-11 NOTE — Patient Instructions (Signed)
The patient should continue to avoid any heavy lifting or strenuous use of arms or shoulders for at least a total of three months from the time of surgery.  The patient may return to driving an automobile as long as they are no longer requiring oral narcotic pain relievers during the daytime.  It would be wise to start driving only short distances during the daylight and gradually increase from there as they feel comfortable.  The patient is encouraged to enroll and participate in the outpatient cardiac rehab program beginning as soon as practical.  The patient should continue all previous medications without changes at this time

## 2014-11-13 ENCOUNTER — Ambulatory Visit: Payer: Self-pay | Admitting: Thoracic Surgery (Cardiothoracic Vascular Surgery)

## 2014-11-16 ENCOUNTER — Encounter (HOSPITAL_COMMUNITY): Payer: BLUE CROSS/BLUE SHIELD

## 2014-11-18 ENCOUNTER — Encounter (HOSPITAL_COMMUNITY)
Admission: RE | Admit: 2014-11-18 | Discharge: 2014-11-18 | Disposition: A | Discharge status: Home / Self Care | Payer: BLUE CROSS/BLUE SHIELD | Source: Ambulatory Visit | Attending: Cardiology | Admitting: Cardiology

## 2014-11-18 ENCOUNTER — Other Ambulatory Visit: Payer: Self-pay

## 2014-11-18 DIAGNOSIS — I350 Nonrheumatic aortic (valve) stenosis: Secondary | ICD-10-CM | POA: Diagnosis not present

## 2014-11-18 MED ORDER — METOPROLOL TARTRATE 25 MG PO TABS: 25.0000 mg | ORAL_TABLET | Freq: Two times a day (BID) | ORAL | Status: DC

## 2014-11-20 ENCOUNTER — Encounter (HOSPITAL_COMMUNITY)
Admission: RE | Admit: 2014-11-20 | Discharge: 2014-11-20 | Disposition: A | Discharge status: Home / Self Care | Payer: BLUE CROSS/BLUE SHIELD | Source: Ambulatory Visit | Attending: Cardiology | Admitting: Cardiology

## 2014-11-20 DIAGNOSIS — I350 Nonrheumatic aortic (valve) stenosis: Secondary | ICD-10-CM | POA: Diagnosis not present

## 2014-11-20 NOTE — Progress Notes (Signed)
Cardiac Rehabilitation Program Outcomes Report   Orientation:  11/04/14 Graduate Date:  tbd Discharge Date:  tbd # of sessions completed: 3  Cardiologist: Branch Family MD:  Memory Argue Time:  0815  A.  Exercise Program:  Tolerates exercise at 2.00 mets for 15 minutes.  Pre walk test results: 2.98 mets  B.  Mental Health:  Significant family stress. Patient states she has no support. Patient encouraged in Rehab classes.  C.  Education/Instruction/Skills  Accurately checks own pulse.  Rest:  58  Exercise:  81  Uses Perceived Exertion Scale and/or Dyspnea Scale  D.  Nutrition/Weight Control/Body Composition:  Adherence to prescribed nutrition program: fair    E.  Blood Lipids    Lab Results  Component Value Date   CHOL 181 09/26/2012   HDL 64 09/26/2012   LDLCALC 88 09/26/2012   TRIG 146 09/26/2012   CHOLHDL 2.8 09/26/2012    F.  Lifestyle Changes:  Not smoking:  Quit 10/04/14  G.  Symptoms noted with exercise:  Asymptomatic  Report Completed By:  Stevphen [place] RN   Comments:  This is the patients first week progress note for [facility].

## 2014-11-23 ENCOUNTER — Encounter (HOSPITAL_COMMUNITY)
Admission: RE | Admit: 2014-11-23 | Discharge: 2014-11-23 | Disposition: A | Discharge status: Home / Self Care | Payer: BLUE CROSS/BLUE SHIELD | Source: Ambulatory Visit | Attending: Cardiology | Admitting: Cardiology

## 2014-11-23 ENCOUNTER — Telehealth: Payer: Self-pay | Admitting: *Deleted

## 2014-11-23 DIAGNOSIS — Z952 Presence of prosthetic heart valve: Secondary | ICD-10-CM | POA: Diagnosis not present

## 2014-11-23 DIAGNOSIS — I350 Nonrheumatic aortic (valve) stenosis: Secondary | ICD-10-CM | POA: Insufficient documentation

## 2014-11-23 NOTE — Telephone Encounter (Signed)
Pt c/o swelling in hands and feet after waking in morning, lasting for an hour X 1 week. Pt says "I just don't feel well". Pt is due to return to work September 8th and wants to know if she needs f/u appt. Before returning to work. Will forward to Dr. Harl Bowie

## 2014-11-23 NOTE — Telephone Encounter (Signed)
Pt denies CP/SOB/weight gain. Pt is scheduled for cardiac rehab at [facility] Wednesday and Friday AM would you want to see her in [place]?

## 2014-11-23 NOTE — Telephone Encounter (Signed)
Scheduled pt for 8/3 at [facility]. Pt has rehab at [facility] so will be able to make 1140 office visit. Will forward to Dr. Harl Bowie as Juluis Rainier

## 2014-11-23 NOTE — Telephone Encounter (Signed)
Please add her on to my [facility] schedule for next week. Is she having any chest pain or SOB? Any weight gain? Swelling can occur in the legs due to hear tissues but would not involve the hands.    [name] MD

## 2014-11-23 NOTE — Telephone Encounter (Signed)
Im off Fri, Wed would work. There is an 1140, I'm not sure what time her rehab is  [name] MD

## 2014-11-25 ENCOUNTER — Encounter (HOSPITAL_COMMUNITY)
Admission: RE | Admit: 2014-11-25 | Discharge: 2014-11-25 | Disposition: A | Discharge status: Home / Self Care | Payer: BLUE CROSS/BLUE SHIELD | Source: Ambulatory Visit | Attending: Cardiology | Admitting: Cardiology

## 2014-11-25 ENCOUNTER — Telehealth: Payer: Self-pay | Admitting: Cardiology

## 2014-11-25 ENCOUNTER — Other Ambulatory Visit: Payer: Self-pay | Admitting: *Deleted

## 2014-11-25 ENCOUNTER — Encounter: Payer: Self-pay | Admitting: Cardiology

## 2014-11-25 ENCOUNTER — Ambulatory Visit (INDEPENDENT_AMBULATORY_CARE_PROVIDER_SITE_OTHER): Payer: BLUE CROSS/BLUE SHIELD | Admitting: Cardiology

## 2014-11-25 VITALS — BP 110/64 | HR 67 | Ht 66.0 in | Wt 195.6 lb

## 2014-11-25 DIAGNOSIS — R05 Cough: Secondary | ICD-10-CM | POA: Diagnosis not present

## 2014-11-25 DIAGNOSIS — I350 Nonrheumatic aortic (valve) stenosis: Secondary | ICD-10-CM | POA: Diagnosis not present

## 2014-11-25 DIAGNOSIS — R059 Cough, unspecified: Secondary | ICD-10-CM

## 2014-11-25 MED ORDER — FUROSEMIDE 20 MG PO TABS: ORAL_TABLET | ORAL | Status: DC

## 2014-11-25 MED ORDER — LOSARTAN POTASSIUM 50 MG PO TABS: 50.0000 mg | ORAL_TABLET | Freq: Every day | ORAL | Status: DC

## 2014-11-25 NOTE — Patient Instructions (Signed)
Your physician recommends that you schedule a follow-up appointment in: In [place] office in October     STOP Lisinopril   START Losartan 50 mg daily   Take Lasix 20 mg daily as needed for swelling     Thank you for choosing [facility] !

## 2014-11-25 NOTE — Progress Notes (Signed)
Patient ID: Michelle Patton, female   DOB: 1956/03/18, 59 y.o.   MRN: [id number]     Clinical Summary [name] is a 59 y.o.female seen today for follow up of the following medical problems. This is an add on visit for recent troubles with swelling,for more detailed medical history please refer to older clinic notes.   1. Aortic stenosis - she reports hx in 1968 at age of 41 of supravavlular aortic stenosis, corrective surgery with patching at that time.  - recently found to have developed severe aortic valvular stenosis with possible recurrence of supravalvular stenosis as well based on imaging. 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  - Medtronic Freestyle stentless porcine valve/aortic root graft (size 21 mm, model # 995, serial # [id number])  - denies any recent SOB or DOE. She has just started cardiac rehab.  - notes some swelling in her feet and hands, primarily first thing in the morning. Not significantly painful. Tends to resolve later in the day.   2. Cough - continued cough despite course of azithromycin last visit. CXRs have shown no infection, does have some lingering atelectasis that is improving.       Past Medical History  Diagnosis Date  . Hypertension   . Hyperlipidemia   . GERD (gastroesophageal reflux disease)   . DVT (deep venous thrombosis)   . Insomnia, unspecified   . Muscle weakness (generalized)   . Peripheral artery disease   . Aortic stenosis, severe 07/20/2014  . Aortic regurgitation 07/20/2014  . Supravalvular aortic stenosis, congenital - s/p repair during childhood   . Family history of adverse reaction to anesthesia     Reports father deliurm in his 58's with CABG  . History of pneumonia   . Sleep apnea     diagnosed multiple years ago at [facility]  . 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 Penicillins Hives    Have taken Keflex before without any adverse reaction.   . Sulfa Antibiotics Nausea And Vomiting     Current Outpatient Prescriptions  Medication Sig Dispense Refill  . aspirin EC 325 MG EC tablet Take 1 tablet (325 mg total) by mouth daily. 30 tablet 0  . Calcium Carb-Cholecalciferol (CALCIUM 600+D3) 600-800 MG-UNIT TABS Take 1 tablet by mouth at bedtime.    . Cholecalciferol (VITAMIN D3) 5000 UNITS TABS Take 5,000 Units by mouth at bedtime.    . clonazePAM (KLONOPIN) 0.5 MG tablet Take 1 tablet (0.5 mg total) by mouth at bedtime. (Patient taking differently: Take 0.5 mg by mouth 2 (two) times daily. ) 30 tablet 5  . Cranberry-Vitamin C-Vitamin E (CRANBERRY PLUS VITAMIN C) 4200-20-3 MG-MG-UNIT CAPS Take 1 capsule by mouth at bedtime.    [name] estradiol (ESTRACE) 1 MG tablet Take 1 mg by mouth at bedtime.    [name] lisinopril (PRINIVIL,ZESTRIL) 20 MG tablet Take 20 mg by mouth at bedtime.     . Magnesium 250 MG TABS Take 250 mg by mouth at bedtime.    . medroxyPROGESTERone (PROVERA) 2.5 MG tablet Take 2.5 mg by mouth at bedtime.    . metoprolol tartrate (LOPRESSOR) 25 MG tablet Take 1 tablet (25 mg total) by mouth 2 (  two) times daily. 90 tablet 3  . Multiple Vitamins-Minerals (MULTIVITAMIN WITH MINERALS) tablet Take 1 tablet by mouth at bedtime.     . Potassium 99 MG TABS Take 99 mg by mouth at bedtime. OTC    . RABEprazole (ACIPHEX) 20 MG tablet Take 20 mg by mouth at bedtime.     . traMADol (ULTRAM) 50 MG tablet Take 1 tablet (50 mg total) by mouth every 12 (twelve) hours as needed for severe pain. 40 tablet 0  . zolpidem (AMBIEN) 10 MG tablet Take 5 mg by mouth at bedtime as needed for sleep.      No current facility-administered medications for this visit.     Past Surgical History  Procedure Laterality  Date  . Iliac artery stent Left 12/2007  . Cardiac valve surgery  1968  . Cholecystectomy    . Cervical fusion    . Rotator cuff repair Bilateral   . Abdominal aortagram  06/24/12  . Abdominal aortagram N/A 06/24/2012    Procedure: ABDOMINAL Maxcine Ham;  Surgeon: Angelia Mould, MD;  Location: [facility] CATH LAB;  Service: Cardiovascular;  Laterality: N/A;  . Tee without cardioversion N/A 07/07/2014    Procedure: TRANSESOPHAGEAL ECHOCARDIOGRAM (TEE);  Surgeon: Arnoldo Lenis, MD;  [facility] ENDO SUITE;  Service: Cardiology;  Laterality: N/A;  . Tee without cardioversion N/A 07/20/2014    Procedure: TRANSESOPHAGEAL ECHOCARDIOGRAM (TEE) WITH PROPOFOL;  Surgeon: Arnoldo Lenis, MD;  Location: [facility] ORS;  Service: Endoscopy;  Laterality: N/A;  . Left and right heart catheterization with coronary angiogram N/A 07/31/2014    Procedure: LEFT AND RIGHT HEART CATHETERIZATION WITH CORONARY ANGIOGRAM;  Surgeon: Burnell Blanks, MD;  Location: [facility] CATH LAB;  Service: Cardiovascular;  Laterality: N/A;  . Aortic valve replacement N/A 10/07/2014    Procedure: REDO AORTIC VALVE REPLACEMENT (AVR);  Surgeon: Rexene Alberts, MD;  Location: [facility];  Service: Open Heart Surgery;  Laterality: N/A;  . Tee without cardioversion N/A 10/07/2014    Procedure: TRANSESOPHAGEAL ECHOCARDIOGRAM (TEE);  Surgeon: Rexene Alberts, MD;  Location: [facility];  Service: Open Heart Surgery;  Laterality: N/A;  . Ascending aortic root replacement N/A 10/07/2014    Procedure: ASCENDING AORTIC ROOT REPLACEMENT;  Surgeon: Rexene Alberts, MD;  Location: [facility];  Service: Open Heart Surgery;  Laterality: N/A;     Allergies  Allergen Reactions  . Penicillins Hives    Have taken Keflex before without any adverse reaction.   . Sulfa Antibiotics Nausea And Vomiting      Family History  Problem Relation Age of Onset  . Hypertension Mother   . Hypertension Father   . Heart disease Father     before age 33  . Other Father      varicose veins     Social History [name] reports that she has been smoking Cigarettes.  She has been smoking about 0.75 packs per day. She has never used smokeless tobacco. [name] reports that she drinks about 2.4 oz of alcohol per week.   Review of Systems CONSTITUTIONAL: No weight loss, fever, chills, weakness or fatigue.  HEENT: Eyes: No visual loss, blurred vision, double vision or yellow sclerae.No hearing loss, sneezing, congestion, runny nose or sore throat.  SKIN: No rash or itching.  CARDIOVASCULAR: per HPI RESPIRATORY: No shortness of breath, cough or sputum.  GASTROINTESTINAL: No anorexia, nausea, vomiting or diarrhea. No abdominal pain or blood.  GENITOURINARY: No burning on urination, no polyuria NEUROLOGICAL: No headache, dizziness, syncope, paralysis, ataxia,  numbness or tingling in the extremities. No change in bowel or bladder control.  MUSCULOSKELETAL: No muscle, back pain, joint pain or stiffness.  LYMPHATICS: No enlarged nodes. No history of splenectomy.  PSYCHIATRIC: No history of depression or anxiety.  ENDOCRINOLOGIC: No reports of sweating, cold or heat intolerance. No polyuria or polydipsia.  [name]   Physical Examination Filed Vitals:   11/25/14 1134  BP: 110/64  Pulse: 67   Filed Vitals:   11/25/14 1134  Height: 5\' 6"  (1.676 m)  Weight: 195 lb 9.6 oz (88.724 kg)    Gen: resting comfortably, no acute distress HEENT: no scleral icterus, pupils equal round and reactive, no palptable cervical adenopathy,  CV: RRR, no m/r/g, no JVD Resp: Clear to auscultation bilaterally GI: abdomen is soft, non-tender, non-distended, normal bowel sounds, no hepatosplenomegaly MSK: extremities are warm, no edema.  Skin: warm, no rash Neuro:  no focal deficits Psych: appropriate affect   Diagnostic Studies 11/11/14 CXR FINDINGS: The right lung is clear. The the pleural effusions have resolved. The left lung exhibits only minimal remaining  subsegmental atelectasis. The cardiac silhouette is top-normal in size. The pulmonary vascularity is normal. The mediastinum is normal in width. There are 8 intact sternal wires. The retrosternal soft tissues are normal. There is mild multilevel degenerative disc space narrowing.  IMPRESSION: Continued improvement in the appearance of the chest with interval clearing of the right lung and decreased subsegmental atelectasis on the left. The pleural effusions have resolved.    Assessment and Plan  1. Aortic stenosis - s/p tissue AVR and aortic root graft placement - recovering well - fairly nonspecific swelling of hands and feet at times, no significant SOB or DOE. Unlikely to be cardiac given it includes the hands. We will try prn lasix to see if helps at all.    2.Cough - continued cough, we will try changing her ACE to ARB.     Arnoldo Lenis, M.D.

## 2014-11-25 NOTE — Telephone Encounter (Signed)
Patient states her meds were sent in to [facility] and need to be sent to different pharmacy. / tg

## 2014-11-27 ENCOUNTER — Encounter (HOSPITAL_COMMUNITY)
Admission: RE | Admit: 2014-11-27 | Discharge: 2014-11-27 | Disposition: A | Discharge status: Home / Self Care | Payer: BLUE CROSS/BLUE SHIELD | Source: Ambulatory Visit | Attending: Cardiology | Admitting: Cardiology

## 2014-11-27 DIAGNOSIS — I350 Nonrheumatic aortic (valve) stenosis: Secondary | ICD-10-CM | POA: Diagnosis not present

## 2014-11-30 ENCOUNTER — Encounter (HOSPITAL_COMMUNITY)
Admission: RE | Admit: 2014-11-30 | Discharge: 2014-11-30 | Disposition: A | Discharge status: Home / Self Care | Payer: BLUE CROSS/BLUE SHIELD | Source: Ambulatory Visit | Attending: Cardiology | Admitting: Cardiology

## 2014-11-30 DIAGNOSIS — I350 Nonrheumatic aortic (valve) stenosis: Secondary | ICD-10-CM | POA: Diagnosis not present

## 2014-12-02 ENCOUNTER — Encounter (HOSPITAL_COMMUNITY)
Admission: RE | Admit: 2014-12-02 | Discharge: 2014-12-02 | Disposition: A | Discharge status: Home / Self Care | Payer: BLUE CROSS/BLUE SHIELD | Source: Ambulatory Visit | Attending: Cardiology | Admitting: Cardiology

## 2014-12-02 DIAGNOSIS — I350 Nonrheumatic aortic (valve) stenosis: Secondary | ICD-10-CM | POA: Diagnosis not present

## 2014-12-03 NOTE — Progress Notes (Signed)
Patient was given individual home exercise plan. Handout was reviewed and discussed. Patient verbalized an understanding. 

## 2014-12-04 ENCOUNTER — Encounter (HOSPITAL_COMMUNITY)
Admission: RE | Admit: 2014-12-04 | Discharge: 2014-12-04 | Disposition: A | Discharge status: Home / Self Care | Payer: BLUE CROSS/BLUE SHIELD | Source: Ambulatory Visit | Attending: Cardiology | Admitting: Cardiology

## 2014-12-04 DIAGNOSIS — I350 Nonrheumatic aortic (valve) stenosis: Secondary | ICD-10-CM | POA: Diagnosis not present

## 2014-12-07 ENCOUNTER — Encounter (HOSPITAL_COMMUNITY)
Admission: RE | Admit: 2014-12-07 | Discharge: 2014-12-07 | Disposition: A | Discharge status: Home / Self Care | Payer: BLUE CROSS/BLUE SHIELD | Source: Ambulatory Visit | Attending: Cardiology | Admitting: Cardiology

## 2014-12-07 DIAGNOSIS — I350 Nonrheumatic aortic (valve) stenosis: Secondary | ICD-10-CM | POA: Diagnosis not present

## 2014-12-09 ENCOUNTER — Encounter (HOSPITAL_COMMUNITY)
Admission: RE | Admit: 2014-12-09 | Discharge: 2014-12-09 | Disposition: A | Discharge status: Home / Self Care | Payer: BLUE CROSS/BLUE SHIELD | Source: Ambulatory Visit | Attending: Cardiology | Admitting: Cardiology

## 2014-12-09 DIAGNOSIS — I350 Nonrheumatic aortic (valve) stenosis: Secondary | ICD-10-CM | POA: Diagnosis not present

## 2014-12-11 ENCOUNTER — Encounter (HOSPITAL_COMMUNITY)
Admission: RE | Admit: 2014-12-11 | Discharge: 2014-12-11 | Disposition: A | Discharge status: Home / Self Care | Payer: BLUE CROSS/BLUE SHIELD | Source: Ambulatory Visit | Attending: Cardiology | Admitting: Cardiology

## 2014-12-11 DIAGNOSIS — I350 Nonrheumatic aortic (valve) stenosis: Secondary | ICD-10-CM | POA: Diagnosis not present

## 2014-12-14 ENCOUNTER — Encounter (HOSPITAL_COMMUNITY)
Admission: RE | Admit: 2014-12-14 | Discharge: 2014-12-14 | Disposition: A | Discharge status: Home / Self Care | Payer: BLUE CROSS/BLUE SHIELD | Source: Ambulatory Visit | Attending: Cardiology | Admitting: Cardiology

## 2014-12-14 DIAGNOSIS — I350 Nonrheumatic aortic (valve) stenosis: Secondary | ICD-10-CM | POA: Diagnosis not present

## 2014-12-16 ENCOUNTER — Encounter (HOSPITAL_COMMUNITY): Payer: BLUE CROSS/BLUE SHIELD

## 2014-12-18 ENCOUNTER — Encounter (HOSPITAL_COMMUNITY)
Admission: RE | Admit: 2014-12-18 | Discharge: 2014-12-18 | Disposition: A | Discharge status: Home / Self Care | Payer: BLUE CROSS/BLUE SHIELD | Source: Ambulatory Visit | Attending: Cardiology | Admitting: Cardiology

## 2014-12-18 DIAGNOSIS — I350 Nonrheumatic aortic (valve) stenosis: Secondary | ICD-10-CM | POA: Diagnosis not present

## 2014-12-21 ENCOUNTER — Encounter (HOSPITAL_COMMUNITY)
Admission: RE | Admit: 2014-12-21 | Discharge: 2014-12-21 | Disposition: A | Discharge status: Home / Self Care | Payer: BLUE CROSS/BLUE SHIELD | Source: Ambulatory Visit | Attending: Cardiology | Admitting: Cardiology

## 2014-12-21 DIAGNOSIS — I350 Nonrheumatic aortic (valve) stenosis: Secondary | ICD-10-CM | POA: Diagnosis not present

## 2014-12-22 ENCOUNTER — Encounter (HOSPITAL_COMMUNITY): Payer: Self-pay | Admitting: Emergency Medicine

## 2014-12-22 ENCOUNTER — Emergency Department (HOSPITAL_COMMUNITY)
Admission: EM | Admit: 2014-12-22 | Discharge: 2014-12-22 | Disposition: A | Discharge status: Home / Self Care | Payer: BLUE CROSS/BLUE SHIELD | Attending: Emergency Medicine | Admitting: Emergency Medicine

## 2014-12-22 ENCOUNTER — Telehealth: Payer: Self-pay | Admitting: *Deleted

## 2014-12-22 ENCOUNTER — Emergency Department (HOSPITAL_COMMUNITY): Payer: BLUE CROSS/BLUE SHIELD

## 2014-12-22 DIAGNOSIS — Z720 Tobacco use: Secondary | ICD-10-CM | POA: Insufficient documentation

## 2014-12-22 DIAGNOSIS — Z9889 Other specified postprocedural states: Secondary | ICD-10-CM | POA: Diagnosis not present

## 2014-12-22 DIAGNOSIS — M7989 Other specified soft tissue disorders: Secondary | ICD-10-CM | POA: Diagnosis not present

## 2014-12-22 DIAGNOSIS — Z880 Allergy status to penicillin: Secondary | ICD-10-CM | POA: Diagnosis not present

## 2014-12-22 DIAGNOSIS — Z86718 Personal history of other venous thrombosis and embolism: Secondary | ICD-10-CM | POA: Diagnosis not present

## 2014-12-22 DIAGNOSIS — R079 Chest pain, unspecified: Secondary | ICD-10-CM | POA: Diagnosis present

## 2014-12-22 DIAGNOSIS — J029 Acute pharyngitis, unspecified: Secondary | ICD-10-CM | POA: Insufficient documentation

## 2014-12-22 DIAGNOSIS — Z7982 Long term (current) use of aspirin: Secondary | ICD-10-CM | POA: Diagnosis not present

## 2014-12-22 DIAGNOSIS — Z8639 Personal history of other endocrine, nutritional and metabolic disease: Secondary | ICD-10-CM | POA: Diagnosis not present

## 2014-12-22 DIAGNOSIS — I159 Secondary hypertension, unspecified: Secondary | ICD-10-CM | POA: Insufficient documentation

## 2014-12-22 DIAGNOSIS — Z79899 Other long term (current) drug therapy: Secondary | ICD-10-CM | POA: Insufficient documentation

## 2014-12-22 DIAGNOSIS — R0789 Other chest pain: Secondary | ICD-10-CM | POA: Insufficient documentation

## 2014-12-22 DIAGNOSIS — K219 Gastro-esophageal reflux disease without esophagitis: Secondary | ICD-10-CM | POA: Diagnosis not present

## 2014-12-22 DIAGNOSIS — Z8701 Personal history of pneumonia (recurrent): Secondary | ICD-10-CM | POA: Insufficient documentation

## 2014-12-22 DIAGNOSIS — G4700 Insomnia, unspecified: Secondary | ICD-10-CM | POA: Diagnosis not present

## 2014-12-22 LAB — CBC WITH DIFFERENTIAL/PLATELET
Basophils Absolute: 0.1 10*3/uL (ref 0.0–0.1)
Basophils Relative: 1 % (ref 0–1)
Eosinophils Absolute: 0.4 10*3/uL (ref 0.0–0.7)
Eosinophils Relative: 3 % (ref 0–5)
HEMATOCRIT: 40.2 % (ref 36.0–46.0)
HEMOGLOBIN: 13.2 g/dL (ref 12.0–15.0)
LYMPHS ABS: 1.7 10*3/uL (ref 0.7–4.0)
Lymphocytes Relative: 16 % (ref 12–46)
MCH: 28 pg (ref 26.0–34.0)
MCHC: 32.8 g/dL (ref 30.0–36.0)
MCV: 85.4 fL (ref 78.0–100.0)
MONOS PCT: 6 % (ref 3–12)
Monocytes Absolute: 0.7 10*3/uL (ref 0.1–1.0)
NEUTROS ABS: 7.9 10*3/uL — AB (ref 1.7–7.7)
NEUTROS PCT: 74 % (ref 43–77)
Platelets: 320 10*3/uL (ref 150–400)
RBC: 4.71 MIL/uL (ref 3.87–5.11)
RDW: 13 % (ref 11.5–15.5)
WBC: 10.7 10*3/uL — ABNORMAL HIGH (ref 4.0–10.5)

## 2014-12-22 LAB — COMPREHENSIVE METABOLIC PANEL
ALBUMIN: 4.3 g/dL (ref 3.5–5.0)
ALK PHOS: 58 U/L (ref 38–126)
ALT: 18 U/L (ref 14–54)
AST: 20 U/L (ref 15–41)
Anion gap: 7 (ref 5–15)
BILIRUBIN TOTAL: 0.6 mg/dL (ref 0.3–1.2)
BUN: 19 mg/dL (ref 6–20)
CHLORIDE: 106 mmol/L (ref 101–111)
CO2: 25 mmol/L (ref 22–32)
Calcium: 9.1 mg/dL (ref 8.9–10.3)
Creatinine, Ser: 1.04 mg/dL — ABNORMAL HIGH (ref 0.44–1.00)
GFR calc Af Amer: >60 mL/min (ref >60)
GFR, EST NON AFRICAN AMERICAN: 58 mL/min — AB (ref >60)
Glucose, Bld: 111 mg/dL — ABNORMAL HIGH (ref 65–99)
POTASSIUM: 3.8 mmol/L (ref 3.5–5.1)
Sodium: 138 mmol/L (ref 135–145)
Total Protein: 7.8 g/dL (ref 6.5–8.1)

## 2014-12-22 LAB — TROPONIN I

## 2014-12-22 NOTE — Discharge Instructions (Signed)

## 2014-12-22 NOTE — ED Notes (Signed)
MD at bedside. 

## 2014-12-22 NOTE — ED Notes (Signed)
Xray at beside

## 2014-12-22 NOTE — ED Notes (Signed)
Pt was called by her employer is am and was fired from her job.  Chest pain started following telephone call.  At time of call, pt rated pain 7/10 and currently pain is 4/10.  Pain is ot mid chest and radiates to neck.  Pt has history of open heart surgery in June 2016.

## 2014-12-22 NOTE — ED Provider Notes (Signed)
CSN: [id number]     Arrival date & time 12/22/14  1135 History  This chart was scribed for Daleen Bo, MD by Irene Pap, ED Scribe. This patient was seen in room APA06/APA06 and patient care was started at 12:23 PM.   Chief Complaint  Patient presents with  . Chest Pain   The history is provided by the patient. No language interpreter was used.  HPI Comments: Michelle Patton is a 59 y.o. Female with hx of HTN, DVT, peripheral artery disease, aortic stenosis and regurgitation, congenital supravalvular aortic stenosis repair, and open heart surgery in June 2016 who presents to the Emergency Department complaining of gradually improving, radiating, sudden, chest pain onset 2 hours ago. Pt states that she was called at 70 AM and was told her employment was terminated; states that this was a very unexpected phone call. She states that she sat down and began to have chest pressure, currently 2/10, 6/10 at the worst. Per triage note, she states that the pain radiates to neck. Says her BP was 94/91, called Dr. Nelly Laurence office and her BP jumped to 171/101. She reports associated throat tightness. Pt states that she has noticed swelling in her bilateral lower legs. She denies SOB, diaphoresis, fever, abdominal pain, nausea, or vomiting. Reports taking her daily medication today. States that she is on disability. Denies being on blood thinners, but takes aspirin. Denies hx of recent heart failure. Pt states that she does cardiac rehab MWF.   Past Medical History  Diagnosis Date  . Hypertension   . Hyperlipidemia   . GERD (gastroesophageal reflux disease)   . DVT (deep venous thrombosis)   . Insomnia, unspecified   . Muscle weakness (generalized)   . Peripheral artery disease   . Aortic stenosis, severe 07/20/2014  . Aortic regurgitation 07/20/2014  . Supravalvular aortic stenosis, congenital - s/p repair during childhood   . Family history of adverse reaction to anesthesia     Reports father deliurm  in his 82's with CABG  . History of pneumonia   . Sleep apnea     diagnosed multiple years ago at [facility]  . 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 Iliac artery stent Left 12/2007  . Cardiac valve surgery  1968  . Cholecystectomy    . Cervical fusion    . Rotator cuff repair Bilateral   . Abdominal aortagram  06/24/12  . Abdominal aortagram N/A 06/24/2012    Procedure: ABDOMINAL Maxcine Ham;  Surgeon: Angelia Mould, MD;  Location: [facility] CATH LAB;  Service: Cardiovascular;  Laterality: N/A;  . Tee without cardioversion N/A 07/07/2014    Procedure: TRANSESOPHAGEAL ECHOCARDIOGRAM (TEE);  Surgeon: Arnoldo Lenis, MD;  [facility] ENDO [facility];  Service: Cardiology;  Laterality: N/A;  . Tee without cardioversion N/A 07/20/2014    Procedure: TRANSESOPHAGEAL ECHOCARDIOGRAM (TEE) WITH PROPOFOL;  Surgeon: Arnoldo Lenis, MD;  Location: [facility] ORS;  Service: Endoscopy;  Laterality: N/A;  . Left and right heart catheterization with coronary angiogram N/A 07/31/2014    Procedure: LEFT AND RIGHT HEART CATHETERIZATION WITH CORONARY ANGIOGRAM;  Surgeon: Burnell Blanks, MD;  Location: [facility] CATH LAB;  Service: Cardiovascular;  Laterality: N/A;  . Aortic valve replacement N/A 10/07/2014    Procedure: REDO AORTIC VALVE REPLACEMENT (AVR);  Surgeon: Braulio Conte  Keturah Barre, MD;  Location: [facility];  Service: Open Heart Surgery;  Laterality: N/A;  . Tee without cardioversion N/A 10/07/2014    Procedure: TRANSESOPHAGEAL ECHOCARDIOGRAM (TEE);  Surgeon: Rexene Alberts, MD;  Location: [facility];  Service: Open Heart Surgery;  Laterality: N/A;  . Ascending aortic root replacement N/A 10/07/2014    Procedure: ASCENDING AORTIC ROOT REPLACEMENT;  Surgeon: Rexene Alberts, MD;  Location: [facility];  Service:  Open Heart Surgery;  Laterality: N/A;   Family History  Problem Relation Age of Onset  . Hypertension Mother   . Hypertension Father   . Heart disease Father     before age 19  . Other Father     varicose veins   Social History  Substance Use Topics  . Smoking status: Current Some Day Smoker -- 0.75 packs/day    Types: Cigarettes    Last Attempt to Quit: 05/25/2009  . Smokeless tobacco: Never Used     Comment: started back about 6 months ago   . Alcohol Use: 2.4 oz/week    4 Glasses of wine per week     Comment: 4 drinks per week   OB History    No data available     Review of Systems  Constitutional: Negative for fever and diaphoresis.  HENT: Positive for sore throat.   Respiratory: Positive for chest tightness. Negative for shortness of breath.   Cardiovascular: Positive for chest pain and leg swelling.  All other systems reviewed and are negative.  Allergies  Penicillins and Sulfa antibiotics  Home Medications   Prior to Admission medications   Medication Sig Start Date End Date Taking? Authorizing Provider  aspirin EC 325 MG EC tablet Take 1 tablet (325 mg total) by mouth daily. 10/12/14   Wayne E Gold, PA-C  Calcium Carb-Cholecalciferol (CALCIUM 600+D3) 600-800 MG-UNIT TABS Take 1 tablet by mouth at bedtime.    Historical Provider, MD  Cholecalciferol (VITAMIN D3) 5000 UNITS TABS Take 5,000 Units by mouth at bedtime.    Historical Provider, MD  clonazePAM (KLONOPIN) 0.5 MG tablet Take 1 tablet (0.5 mg total) by mouth at bedtime. Patient taking differently: Take 0.5 mg by mouth 2 (two) times daily.  09/26/12   Marcial Pacas, MD  Cranberry-Vitamin C-Vitamin E (CRANBERRY PLUS VITAMIN C) 4200-20-3 MG-MG-UNIT CAPS Take 1 capsule by mouth at bedtime.    Historical Provider, MD  estradiol (ESTRACE) 1 MG tablet Take 1 mg by mouth at bedtime.    Historical Provider, MD  furosemide (LASIX) 20 MG tablet Take 20 mg daily as needed for swelling 11/25/14   Arnoldo Lenis, MD   losartan (COZAAR) 50 MG tablet Take 1 tablet (50 mg total) by mouth daily. 11/25/14   Arnoldo Lenis, MD  Magnesium 250 MG TABS Take 250 mg by mouth at bedtime.    Historical Provider, MD  medroxyPROGESTERone (PROVERA) 2.5 MG tablet Take 2.5 mg by mouth at bedtime.    Historical Provider, MD  metoprolol tartrate (LOPRESSOR) 25 MG tablet Take 1 tablet (25 mg total) by mouth 2 (two) times daily. 11/18/14   Arnoldo Lenis, MD  Multiple Vitamins-Minerals (MULTIVITAMIN WITH MINERALS) tablet Take 1 tablet by mouth at bedtime.     Historical Provider, MD  Potassium 99 MG TABS Take 99 mg by mouth at bedtime. OTC    Historical Provider, MD  RABEprazole (ACIPHEX) 20 MG tablet Take 20 mg by mouth at bedtime.  06/08/12   Historical Provider, MD  traMADol (ULTRAM) 50 MG tablet  Take 1 tablet (50 mg total) by mouth every 12 (twelve) hours as needed for severe pain. 11/11/14   Rexene Alberts, MD  zolpidem (AMBIEN) 10 MG tablet Take 5 mg by mouth at bedtime as needed for sleep.  07/05/12   Historical Provider, MD   BP 156/91 mmHg  Pulse 74  Temp(Src) 98.7 F (37.1 C) (Oral)  Resp 19  Ht 5\' 6"  (1.676 m)  Wt 202 lb (91.627 kg)  BMI 32.62 kg/m2  SpO2 100%  Physical Exam  Constitutional: She is oriented to person, place, and time. She appears well-developed and well-nourished.  HENT:  Head: Normocephalic and atraumatic.  Eyes: Conjunctivae and EOM are normal. Pupils are equal, round, and reactive to light.  Neck: Normal range of motion and phonation normal. Neck supple.  Cardiovascular: Normal rate and regular rhythm.   Pulmonary/Chest: Effort normal and breath sounds normal. She exhibits no tenderness.  Chest wall tenderness is mild  Abdominal: Soft. She exhibits no distension. There is no tenderness. There is no guarding.  Musculoskeletal: Normal range of motion.  Neurological: She is alert and oriented to person, place, and time. She exhibits normal muscle tone.  Skin: Skin is warm and dry.   Psychiatric: She has a normal mood and affect. Her behavior is normal. Judgment and thought content normal.  Nursing note and vitals reviewed.   ED Course  Procedures (including critical care time) DIAGNOSTIC STUDIES: Oxygen Saturation is 100% on RA, normal by my interpretation.    COORDINATION OF CARE: 12:30 PM-Discussed treatment plan which include labs and EKG with pt at bedside and pt agreed to plan.   Medications - No data to display  No data found.   12:52 PM Reevaluation with update and discussion. After initial assessment and treatment, an updated evaluation reveals blood pressure 154/78. She is comfortable and states that her discomfort has improved. Findings discussed with the patient, all questions were answered.Daleen Bo L    Labs Review Labs Reviewed  CBC WITH DIFFERENTIAL/PLATELET - Abnormal; Notable for the following:    WBC 10.7 (*)    Neutro Abs 7.9 (*)    All other components within normal limits  COMPREHENSIVE METABOLIC PANEL - Abnormal; Notable for the following:    Glucose, Bld 111 (*)    Creatinine, Ser 1.04 (*)    GFR calc non Af Amer 58 (*)    All other components within normal limits  TROPONIN I    Imaging Review Dg Chest Portable 1 View  12/22/2014   CLINICAL DATA:  Central chest pain, neck pain since this morning. Recent aortic root and valve repair.  EXAM: PORTABLE CHEST - 1 VIEW  COMPARISON:  11/11/2014  FINDINGS: Prior median sternotomy. Mild cardiomegaly. Lungs are clear. No effusions or edema. No acute bony abnormality.  IMPRESSION: Mild cardiomegaly.  No active disease.   Electronically Signed   By: Rolm Baptise M.D.   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 Patient had cardiac  catheterization April 2016, with normal coronary arteries. She is at very low risk for acute coronary syndrome. Initial testing is negative. There is no indication for repeat testing or further evaluation in the ED or hospital.  Nursing Notes Reviewed/ Care Coordinated Applicable Imaging Reviewed Interpretation of Laboratory Data incorporated into ED treatment  The patient appears reasonably screened and/or stabilized for discharge and I doubt any other medical condition or other [facility] requiring further screening, evaluation, or treatment in the ED at this time prior to discharge.  Plan: Home Medications- usual; Home Treatments- rest; return here if the recommended treatment, does not improve the symptoms; Recommended follow up- PCP 1 week   I personally performed the services described in this documentation, which was scribed in my presence. The recorded information has been reviewed and is accurate.     Daleen Bo, MD 12/23/14 [phone]

## 2014-12-22 NOTE — Telephone Encounter (Signed)
Patient called the office stating that she was having active chest pain. Patient described the chest as pressure that feels heavy and its also in her throat. Patient rated the chest pain #6 on a scale of 1-10 (10 being the greatest). Patient said her BP was 191/94 & HR 79. No c/o dizziness or sob. Patient informed nurse that she was fired from her job this morning. Nurse advised patient to go to the ED for an evaluation. Patient verbalized understanding of plan.

## 2014-12-22 NOTE — ED Notes (Signed)
Urine sample collected

## 2014-12-23 ENCOUNTER — Encounter (HOSPITAL_COMMUNITY)
Admission: RE | Admit: 2014-12-23 | Discharge: 2014-12-23 | Disposition: A | Discharge status: Home / Self Care | Payer: BLUE CROSS/BLUE SHIELD | Source: Ambulatory Visit | Attending: Cardiology | Admitting: Cardiology

## 2014-12-23 DIAGNOSIS — I350 Nonrheumatic aortic (valve) stenosis: Secondary | ICD-10-CM | POA: Diagnosis not present

## 2014-12-25 ENCOUNTER — Encounter (HOSPITAL_COMMUNITY)
Admission: RE | Admit: 2014-12-25 | Discharge: 2014-12-25 | Disposition: A | Discharge status: Home / Self Care | Payer: BLUE CROSS/BLUE SHIELD | Source: Ambulatory Visit | Attending: Cardiology | Admitting: Cardiology

## 2014-12-25 DIAGNOSIS — I350 Nonrheumatic aortic (valve) stenosis: Secondary | ICD-10-CM | POA: Diagnosis not present

## 2014-12-25 DIAGNOSIS — Z952 Presence of prosthetic heart valve: Secondary | ICD-10-CM | POA: Insufficient documentation

## 2014-12-28 ENCOUNTER — Encounter (HOSPITAL_COMMUNITY): Payer: BLUE CROSS/BLUE SHIELD

## 2014-12-30 ENCOUNTER — Encounter (HOSPITAL_COMMUNITY)
Admission: RE | Admit: 2014-12-30 | Discharge: 2014-12-30 | Disposition: A | Discharge status: Home / Self Care | Payer: BLUE CROSS/BLUE SHIELD | Source: Ambulatory Visit | Attending: Cardiology | Admitting: Cardiology

## 2014-12-30 DIAGNOSIS — I350 Nonrheumatic aortic (valve) stenosis: Secondary | ICD-10-CM | POA: Diagnosis not present

## 2014-12-30 NOTE — Progress Notes (Signed)
Cardiac Rehabilitation Program Outcomes Report   Orientation:  11/04/14 Graduate Date:  tbd Discharge Date:  tbd # of sessions completed: 18  Cardiologist: Jefm Bryant MD:  Memory Argue Time:  0815  A.  Exercise Program:  Tolerates exercise @ 3.62 METS for 15 minutes  B.  Mental Health:  Good mental attitude and Significant family stress  C.  Education/Instruction/Skills  Accurately checks own pulse.  Rest:  65  Exercise:  93  Uses Perceived Exertion Scale and/or Dyspnea Scale  D.  Nutrition/Weight Control/Body Composition:  Adherence to prescribed nutrition program: fair    E.  Blood Lipids    Lab Results  Component Value Date   CHOL 181 09/26/2012   HDL 64 09/26/2012   LDLCALC 88 09/26/2012   TRIG 146 09/26/2012   CHOLHDL 2.8 09/26/2012    F.  Lifestyle Changes:  Making positive lifestyle changes  G.  Symptoms noted with exercise:  Asymptomatic  Report Completed By:  Stevphen Rochester RN   Comments:  This is patients halfway progress note.  Patient started out negative about program but is more engaged now in class and at home with exercise.

## 2015-01-01 ENCOUNTER — Encounter (HOSPITAL_COMMUNITY)
Admission: RE | Admit: 2015-01-01 | Discharge: 2015-01-01 | Disposition: A | Discharge status: Home / Self Care | Payer: BLUE CROSS/BLUE SHIELD | Source: Ambulatory Visit | Attending: Cardiology | Admitting: Cardiology

## 2015-01-01 DIAGNOSIS — I350 Nonrheumatic aortic (valve) stenosis: Secondary | ICD-10-CM | POA: Diagnosis not present

## 2015-01-04 ENCOUNTER — Encounter (HOSPITAL_COMMUNITY)
Admission: RE | Admit: 2015-01-04 | Discharge: 2015-01-04 | Disposition: A | Discharge status: Home / Self Care | Payer: BLUE CROSS/BLUE SHIELD | Source: Ambulatory Visit | Attending: Cardiology | Admitting: Cardiology

## 2015-01-04 DIAGNOSIS — I350 Nonrheumatic aortic (valve) stenosis: Secondary | ICD-10-CM | POA: Diagnosis not present

## 2015-01-06 ENCOUNTER — Encounter (HOSPITAL_COMMUNITY)
Admission: RE | Admit: 2015-01-06 | Discharge: 2015-01-06 | Disposition: A | Discharge status: Home / Self Care | Payer: BLUE CROSS/BLUE SHIELD | Source: Ambulatory Visit | Attending: Cardiology | Admitting: Cardiology

## 2015-01-06 DIAGNOSIS — I350 Nonrheumatic aortic (valve) stenosis: Secondary | ICD-10-CM | POA: Diagnosis not present

## 2015-01-08 ENCOUNTER — Encounter (HOSPITAL_COMMUNITY): Payer: BLUE CROSS/BLUE SHIELD

## 2015-01-11 ENCOUNTER — Encounter (HOSPITAL_COMMUNITY)
Admission: RE | Admit: 2015-01-11 | Discharge: 2015-01-11 | Disposition: A | Discharge status: Home / Self Care | Payer: BLUE CROSS/BLUE SHIELD | Source: Ambulatory Visit | Attending: Cardiology | Admitting: Cardiology

## 2015-01-11 DIAGNOSIS — I350 Nonrheumatic aortic (valve) stenosis: Secondary | ICD-10-CM | POA: Diagnosis not present

## 2015-01-13 ENCOUNTER — Other Ambulatory Visit: Payer: Self-pay | Admitting: *Deleted

## 2015-01-13 ENCOUNTER — Encounter (HOSPITAL_COMMUNITY)
Admission: RE | Admit: 2015-01-13 | Discharge: 2015-01-13 | Disposition: A | Discharge status: Home / Self Care | Payer: BLUE CROSS/BLUE SHIELD | Source: Ambulatory Visit | Attending: Cardiology | Admitting: Cardiology

## 2015-01-13 DIAGNOSIS — I350 Nonrheumatic aortic (valve) stenosis: Secondary | ICD-10-CM | POA: Diagnosis not present

## 2015-01-13 MED ORDER — LOSARTAN POTASSIUM 50 MG PO TABS: 50.0000 mg | ORAL_TABLET | Freq: Every day | ORAL | Status: DC

## 2015-01-13 MED ORDER — METOPROLOL TARTRATE 25 MG PO TABS: 25.0000 mg | ORAL_TABLET | Freq: Two times a day (BID) | ORAL | Status: DC

## 2015-01-13 NOTE — Telephone Encounter (Signed)
Pt requesting 90 day supply of Losartan and Metoprolol sent to [facility] since insurance is running out end of this month and pt is no longer employed. Pt husband does have insurance and will update [place] with info when added to policy. Medication sent to pharmacy.

## 2015-01-15 ENCOUNTER — Encounter (HOSPITAL_COMMUNITY)
Admission: RE | Admit: 2015-01-15 | Discharge: 2015-01-15 | Disposition: A | Discharge status: Home / Self Care | Payer: BLUE CROSS/BLUE SHIELD | Source: Ambulatory Visit | Attending: Cardiology | Admitting: Cardiology

## 2015-01-15 DIAGNOSIS — I350 Nonrheumatic aortic (valve) stenosis: Secondary | ICD-10-CM | POA: Diagnosis not present

## 2015-01-18 ENCOUNTER — Encounter (HOSPITAL_COMMUNITY)
Admission: RE | Admit: 2015-01-18 | Discharge: 2015-01-18 | Disposition: A | Discharge status: Home / Self Care | Payer: BLUE CROSS/BLUE SHIELD | Source: Ambulatory Visit | Attending: Cardiology | Admitting: Cardiology

## 2015-01-18 DIAGNOSIS — I350 Nonrheumatic aortic (valve) stenosis: Secondary | ICD-10-CM | POA: Diagnosis not present

## 2015-01-20 ENCOUNTER — Encounter (HOSPITAL_COMMUNITY)
Admission: RE | Admit: 2015-01-20 | Discharge: 2015-01-20 | Disposition: A | Discharge status: Home / Self Care | Payer: BLUE CROSS/BLUE SHIELD | Source: Ambulatory Visit | Attending: Cardiology | Admitting: Cardiology

## 2015-01-20 DIAGNOSIS — I350 Nonrheumatic aortic (valve) stenosis: Secondary | ICD-10-CM | POA: Diagnosis not present

## 2015-01-22 ENCOUNTER — Encounter (HOSPITAL_COMMUNITY)
Admission: RE | Admit: 2015-01-22 | Discharge: 2015-01-22 | Disposition: A | Discharge status: Home / Self Care | Payer: BLUE CROSS/BLUE SHIELD | Source: Ambulatory Visit | Attending: Cardiology | Admitting: Cardiology

## 2015-01-22 DIAGNOSIS — I350 Nonrheumatic aortic (valve) stenosis: Secondary | ICD-10-CM | POA: Diagnosis not present

## 2015-01-25 ENCOUNTER — Encounter (HOSPITAL_COMMUNITY)
Admission: RE | Admit: 2015-01-25 | Discharge: 2015-01-25 | Disposition: A | Discharge status: Home / Self Care | Payer: BLUE CROSS/BLUE SHIELD | Source: Ambulatory Visit | Attending: Cardiology | Admitting: Cardiology

## 2015-01-25 DIAGNOSIS — Z952 Presence of prosthetic heart valve: Secondary | ICD-10-CM | POA: Diagnosis not present

## 2015-01-25 DIAGNOSIS — I350 Nonrheumatic aortic (valve) stenosis: Secondary | ICD-10-CM | POA: Insufficient documentation

## 2015-01-27 ENCOUNTER — Ambulatory Visit (INDEPENDENT_AMBULATORY_CARE_PROVIDER_SITE_OTHER): Payer: BLUE CROSS/BLUE SHIELD | Admitting: Cardiology

## 2015-01-27 ENCOUNTER — Encounter: Payer: Self-pay | Admitting: Cardiology

## 2015-01-27 ENCOUNTER — Encounter (HOSPITAL_COMMUNITY)
Admission: RE | Admit: 2015-01-27 | Discharge: 2015-01-27 | Disposition: A | Discharge status: Home / Self Care | Payer: BLUE CROSS/BLUE SHIELD | Source: Ambulatory Visit | Attending: Cardiology | Admitting: Cardiology

## 2015-01-27 VITALS — BP 123/77 | HR 57 | Ht 66.0 in | Wt 208.0 lb

## 2015-01-27 DIAGNOSIS — I350 Nonrheumatic aortic (valve) stenosis: Secondary | ICD-10-CM

## 2015-01-27 DIAGNOSIS — I10 Essential (primary) hypertension: Secondary | ICD-10-CM | POA: Diagnosis not present

## 2015-01-27 NOTE — Patient Instructions (Signed)
Your physician recommends that you schedule a follow-up appointment in: 3 months with Dr. Harl Bowie  Your physician has recommended you make the following change in your medication:   [place]  Thank you for choosing [facility]!!

## 2015-01-27 NOTE — Progress Notes (Signed)
Patient ID: Michelle Patton, female   DOB: 1955-09-17, 59 y.o.   MRN: [id number]     Clinical Summary [name] is a 59 y.o.female seen today for follow up of the following medical problems.   1. Aortic stenosis - she reports hx in 1968 at age of 27 of supravavlular aortic stenosis, corrective surgery with patching at that time.  - recently found to have developed severe aortic valvular stenosis with possible recurrence of supravalvular stenosis as well based on imaging. 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  - Medtronic Freestyle stentless porcine valve/aortic root graft (size 21 mm, model # 995, serial # [id number])   - rehab 3 times a week, walking 2-3 miles daily without any significant symptoms.   2. PAD - followed by vascular, history of left external iliac stent.   3. Chest pain - seen in ER 12/22/14 with chest pain. Symptoms started after being fired from her job. Negative workup in ER, discharged home.   4. HTN - we recently changed her ACE-I to losartan due to cough. Since being on losartan she has noted significant insomnia she associates with losartan. She stopped taking it on her own about 2 weeks ago. - bp log off of losartan shows bps 110s/60s  Past Medical History  Diagnosis Date  . Hypertension   . Hyperlipidemia   . GERD (gastroesophageal reflux disease)   . DVT (deep venous thrombosis)   . Insomnia, unspecified   . Muscle weakness (generalized)   . Peripheral artery disease   . Aortic stenosis, severe 07/20/2014  . Aortic regurgitation 07/20/2014  . Supravalvular aortic stenosis, congenital - s/p repair during childhood   . Family history of adverse reaction to anesthesia     Reports father deliurm in his 8's with CABG  . History of pneumonia   . Sleep apnea     diagnosed multiple years ago at [facility]  . 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 Penicillins Hives    Have taken Keflex before without any adverse reaction.   . Sulfa Antibiotics Nausea And Vomiting     Current Outpatient Prescriptions  Medication Sig Dispense Refill  . aspirin EC 325 MG EC tablet Take 1 tablet (325 mg total) by mouth daily. 30 tablet 0  . Calcium Carb-Cholecalciferol (CALCIUM 600+D3) 600-800 MG-UNIT TABS Take 1 tablet by mouth at bedtime.    . Cholecalciferol (VITAMIN D3) 5000 UNITS TABS Take 5,000 Units by mouth at bedtime.    . clonazePAM (KLONOPIN) 0.5 MG tablet Take 1 tablet (0.5 mg total) by mouth at bedtime. (Patient taking differently: Take 0.5 mg by mouth 2 (two) times daily. ) 30 tablet 5  . Cranberry-Vitamin C-Vitamin E (CRANBERRY PLUS VITAMIN C) 4200-20-3 MG-MG-UNIT CAPS Take 1 capsule by mouth at bedtime.    [name] estradiol (ESTRACE) 1 MG tablet Take 1 mg by mouth at bedtime.    . furosemide (LASIX) 20 MG tablet Take 20 mg daily as needed for swelling 30 tablet 0  . losartan (COZAAR) 50 MG tablet Take 1 tablet (50 mg total) by mouth daily. 90 tablet 3  . Magnesium 250 MG TABS Take 250 mg by mouth at bedtime.    [name]  medroxyPROGESTERone (PROVERA) 2.5 MG tablet Take 2.5 mg by mouth at bedtime.    . metoprolol tartrate (LOPRESSOR) 25 MG tablet Take 1 tablet (25 mg total) by mouth 2 (two) times daily. 180 tablet 3  . Multiple Vitamins-Minerals (MULTIVITAMIN WITH MINERALS) tablet Take 1 tablet by mouth at bedtime.     . Potassium 99 MG TABS Take 99 mg by mouth at bedtime. OTC    . RABEprazole (ACIPHEX) 20 MG tablet Take 20 mg by mouth at bedtime.     . traMADol (ULTRAM) 50 MG tablet Take 1 tablet (50 mg total) by mouth every 12 (twelve) hours as needed for severe pain. 40 tablet 0  . zolpidem (AMBIEN) 10 MG tablet Take 5 mg by mouth at bedtime  as needed for sleep.      No current facility-administered medications for this visit.     Past Surgical History  Procedure Laterality Date  . Iliac artery stent Left 12/2007  . Cardiac valve surgery  1968  . Cholecystectomy    . Cervical fusion    . Rotator cuff repair Bilateral   . Abdominal aortagram  06/24/12  . Abdominal aortagram N/A 06/24/2012    Procedure: ABDOMINAL Maxcine Ham;  Surgeon: Angelia Mould, MD;  Location: [facility] CATH LAB;  Service: Cardiovascular;  Laterality: N/A;  . Tee without cardioversion N/A 07/07/2014    Procedure: TRANSESOPHAGEAL ECHOCARDIOGRAM (TEE);  Surgeon: Arnoldo Lenis, MD;  Location: [facility] ENDO SUITE;  Service: Cardiology;  Laterality: N/A;  . Tee without cardioversion N/A 07/20/2014    Procedure: TRANSESOPHAGEAL ECHOCARDIOGRAM (TEE) WITH PROPOFOL;  Surgeon: Arnoldo Lenis, MD;  Location: [facility] ORS;  Service: Endoscopy;  Laterality: N/A;  . Left and right heart catheterization with coronary angiogram N/A 07/31/2014    Procedure: LEFT AND RIGHT HEART CATHETERIZATION WITH CORONARY ANGIOGRAM;  Surgeon: Burnell Blanks, MD;  Location: [facility] CATH LAB;  Service: Cardiovascular;  Laterality: N/A;  . Aortic valve replacement N/A 10/07/2014    Procedure: REDO AORTIC VALVE REPLACEMENT (AVR);  Surgeon: Rexene Alberts, MD;  Location: [place];  Service: Open Heart Surgery;  Laterality: N/A;  . Tee without cardioversion N/A 10/07/2014    Procedure: TRANSESOPHAGEAL ECHOCARDIOGRAM (TEE);  Surgeon: Rexene Alberts, MD;  Location: [place];  Service: Open Heart Surgery;  Laterality: N/A;  . Ascending aortic root replacement N/A 10/07/2014    Procedure: ASCENDING AORTIC ROOT REPLACEMENT;  Surgeon: Rexene Alberts, MD;  Location: [place];  Service: Open Heart Surgery;  Laterality: N/A;     Allergies  Allergen Reactions  . Penicillins Hives    Have taken Keflex before without any adverse reaction.   . Sulfa Antibiotics Nausea And Vomiting      Family History  Problem  Relation Age of Onset  . Hypertension Mother   . Hypertension Father   . Heart disease Father     before age 32  . Other Father     varicose veins     Social History [name] reports that she has been smoking Cigarettes.  She has been smoking about 0.75 packs per day. She has never used smokeless tobacco. [name] reports that she drinks about 2.4 oz of alcohol per week.   Review of Systems CONSTITUTIONAL: No weight loss, fever, chills, weakness or fatigue.  HEENT: Eyes: No visual loss, blurred vision, double vision or yellow sclerae.No hearing loss, sneezing, congestion, runny nose or sore throat.  SKIN: No rash or itching.  CARDIOVASCULAR: per HPI RESPIRATORY: No shortness of breath,  cough or sputum.  GASTROINTESTINAL: No anorexia, nausea, vomiting or diarrhea. No abdominal pain or blood.  GENITOURINARY: No burning on urination, no polyuria NEUROLOGICAL: No headache, dizziness, syncope, paralysis, ataxia, numbness or tingling in the extremities. No change in bowel or bladder control.  MUSCULOSKELETAL: No muscle, back pain, joint pain or stiffness.  LYMPHATICS: No enlarged nodes. No history of splenectomy.  PSYCHIATRIC: No history of depression or anxiety.  ENDOCRINOLOGIC: No reports of sweating, cold or heat intolerance. No polyuria or polydipsia.  [name]   Physical Examination Filed Vitals:   01/27/15 1003  BP: 123/77  Pulse: 57   Filed Vitals:   01/27/15 1003  Height: 5\' 6"  (1.676 m)  Weight: 208 lb (94.348 kg)    Gen: resting comfortably, no acute distress HEENT: no scleral icterus, pupils equal round and reactive, no palptable cervical adenopathy,  CV: RRR, 2/6 systolic murmur RUSB, no JVD Resp: Clear to auscultation bilaterally GI: abdomen is soft, non-tender, non-distended, normal bowel sounds, no hepatosplenomegaly MSK: extremities are warm, no edema.  Skin: warm, no rash Neuro:  no focal deficits Psych: appropriate affect      Assessment and Plan  1.  Aortic stenosis - s/p tissue AVR and aortic root graft placement - recovering well, remains very physically active at rehab and on her own without symptoms - continue to follow clinically  2. HTN - she stopped losartan on her own, felt it was causing insomnia. Off of it her bp's remain at goal, will not restart at this time and continue to follow her symptoms.     F/u 3 months  Arnoldo Lenis, M.D.

## 2015-01-29 ENCOUNTER — Encounter (HOSPITAL_COMMUNITY)
Admission: RE | Admit: 2015-01-29 | Discharge: 2015-01-29 | Disposition: A | Discharge status: Home / Self Care | Payer: BLUE CROSS/BLUE SHIELD | Source: Ambulatory Visit | Attending: Cardiology | Admitting: Cardiology

## 2015-01-29 DIAGNOSIS — I350 Nonrheumatic aortic (valve) stenosis: Secondary | ICD-10-CM | POA: Diagnosis not present

## 2015-02-01 ENCOUNTER — Encounter (HOSPITAL_COMMUNITY): Payer: BLUE CROSS/BLUE SHIELD

## 2015-02-03 ENCOUNTER — Encounter (HOSPITAL_COMMUNITY)
Admission: RE | Admit: 2015-02-03 | Discharge: 2015-02-03 | Disposition: A | Discharge status: Home / Self Care | Payer: BLUE CROSS/BLUE SHIELD | Source: Ambulatory Visit | Attending: Cardiology | Admitting: Cardiology

## 2015-02-03 DIAGNOSIS — I350 Nonrheumatic aortic (valve) stenosis: Secondary | ICD-10-CM | POA: Diagnosis not present

## 2015-02-05 ENCOUNTER — Encounter (HOSPITAL_COMMUNITY)
Admission: RE | Admit: 2015-02-05 | Discharge: 2015-02-05 | Disposition: A | Discharge status: Home / Self Care | Payer: BLUE CROSS/BLUE SHIELD | Source: Ambulatory Visit | Attending: Cardiology | Admitting: Cardiology

## 2015-02-05 DIAGNOSIS — I350 Nonrheumatic aortic (valve) stenosis: Secondary | ICD-10-CM | POA: Diagnosis not present

## 2015-02-08 ENCOUNTER — Encounter (HOSPITAL_COMMUNITY)
Admission: RE | Admit: 2015-02-08 | Discharge: 2015-02-08 | Disposition: A | Discharge status: Home / Self Care | Payer: BLUE CROSS/BLUE SHIELD | Source: Ambulatory Visit | Attending: Cardiology | Admitting: Cardiology

## 2015-02-08 ENCOUNTER — Ambulatory Visit (INDEPENDENT_AMBULATORY_CARE_PROVIDER_SITE_OTHER): Payer: BLUE CROSS/BLUE SHIELD | Admitting: Thoracic Surgery (Cardiothoracic Vascular Surgery)

## 2015-02-08 ENCOUNTER — Encounter: Payer: Self-pay | Admitting: Thoracic Surgery (Cardiothoracic Vascular Surgery)

## 2015-02-08 VITALS — BP 156/80 | HR 75 | Resp 16 | Ht 66.0 in | Wt 209.0 lb

## 2015-02-08 DIAGNOSIS — Z954 Presence of other heart-valve replacement: Secondary | ICD-10-CM

## 2015-02-08 DIAGNOSIS — I350 Nonrheumatic aortic (valve) stenosis: Secondary | ICD-10-CM | POA: Diagnosis not present

## 2015-02-08 NOTE — Patient Instructions (Signed)
Continue to monitor your blood pressure on a daily basis and report elevated blood pressure measurements to Dr. Bryna Colander may resume unrestricted physical activity without any particular limitations at this time.  Endocarditis is a potentially serious infection of heart valves or inside lining of the heart.  It occurs more commonly in patients with diseased heart valves (such as patient's with aortic or mitral valve disease) and in patients who have undergone heart valve repair or replacement.  Certain surgical and dental procedures may put you at risk, such as dental cleaning, other dental procedures, or any surgery involving the respiratory, urinary, gastrointestinal tract, gallbladder or prostate gland.   To minimize your chances for develooping endocarditis, maintain good oral health and seek prompt medical attention for any infections involving the mouth, teeth, gums, skin or urinary tract.    Always notify your doctor or dentist about your underlying heart valve condition before having any invasive procedures. You will need to take antibiotics before certain procedures, including all routine dental cleanings or other dental procedures.  Your cardiologist or dentist should prescribe these antibiotics for you to be taken ahead of time.

## 2015-02-08 NOTE — Progress Notes (Signed)
[name]       [place] [address]             [phone]     CARDIOTHORACIC SURGERY OFFICE NOTE  Referring Provider is Branch, Alphonse Guild, MD PCP is Octavio Graves, DO   HPI:  Patient returns to the office for routine follow-up status post redo median sternotomy for aortic root replacement using Medtronic free style stentless porcine aortic root graft on 10/07/2014 for severe aortic stenosis status post patch repair for congenital supravalvular aortic stenosis in the remote past. The patient's postoperative recovery was uncomplicated and she was last seen here in our office on 11/11/2014. Since then she has been followed carefully by Dr. Harl Bowie. Unfortunately the patient has had a very difficult few months for reasons unrelated to her health. She lost her job and she has had a death in her family. She has been under a great deal of stress. She has continued to participate in the cardiac rehabilitation program and she reports gradual slow improvement in her exercise tolerance. She has felt intermittently overwhelmed with a variety of stressors at home. She no longer has any pain in her chest. Her exercise tolerance has been slow to improve and she is a bit discouraged and wonders whether or not she will ever be able to return to her previous "energetic self". She is walking 2-3 miles a day without significant difficulty.  She stopped taking losartan because of weight gain and insomnia.   Current Outpatient Prescriptions  Medication Sig Dispense Refill  . aspirin EC 325 MG EC tablet Take 1 tablet (325 mg total) by mouth daily. 30 tablet 0  . Calcium Carb-Cholecalciferol (CALCIUM 600+D3) 600-800 MG-UNIT TABS Take 1 tablet by mouth at bedtime.    . Cholecalciferol (VITAMIN D3) 5000 UNITS TABS Take 5,000 Units by mouth at bedtime.    . clonazePAM (KLONOPIN) 0.5 MG tablet Take 1 tablet (0.5 mg total) by mouth at bedtime. (Patient taking differently: Take 0.5 mg by mouth  daily. ) 30 tablet 5  . Cranberry-Vitamin C-Vitamin E (CRANBERRY PLUS VITAMIN C) 4200-20-3 MG-MG-UNIT CAPS Take 1 capsule by mouth at bedtime.    [name] estradiol (ESTRACE) 1 MG tablet Take 1 mg by mouth at bedtime.    . furosemide (LASIX) 20 MG tablet Take 20 mg daily as needed for swelling 30 tablet 0  . Magnesium 250 MG TABS Take 250 mg by mouth at bedtime.    . medroxyPROGESTERone (PROVERA) 2.5 MG tablet Take 2.5 mg by mouth at bedtime.    . metoprolol tartrate (LOPRESSOR) 25 MG tablet Take 1 tablet (25 mg total) by mouth 2 (two) times daily. 180 tablet 3  . Multiple Vitamins-Minerals (MULTIVITAMIN WITH MINERALS) tablet Take 1 tablet by mouth at bedtime.     . Potassium 99 MG TABS Take 99 mg by mouth at bedtime. OTC    . RABEprazole (ACIPHEX) 20 MG tablet Take 20 mg by mouth at bedtime.      No current facility-administered medications for this visit.      Physical Exam:   BP 156/80 mmHg  Pulse 75  Resp 16  Ht 5\' 6"  (1.676 m)  Wt 209 lb (94.802 kg)  BMI 33.75 kg/m2  SpO2 98%  General:  Obese but well appearing  Chest:   Clear to auscultation  CV:   Regular rate and rhythm with soft systolic murmur  Incisions:  Well healed, sternum is stable  Abdomen:  Soft and nontender  Extremities:  Warm and well-perfused  Diagnostic Tests:  n/a   Impression:  Patient is doing very well from a cardiac standpoint more than 3 months following redo aortic root replacement using a stentless porcine aortic root graft.  She recently stopped taking losartan because of associated side effects. Her blood pressure is running slightly high in our office today.  The patient may be suffering significantly from depression related to her numerous ongoing stressors at home.  Plan:  I've encouraged the patient to stick with her program of regular exercise and to hope for continued gradual improvement in her exercise tolerance. I've suggested that she should keep a close eye on her blood pressure and touch  base with Dr. Harl Bowie if it remains elevated to consider restarting an alternative agent for hypertension.  We have not recommended any changes to her current medications at this time. I have released the patient to unrestricted physical activity.  I have encouraged the patient to seek professional assistance if she continues to have problems with psychological adjustment to her numerous ongoing problems at home.  The patient will return for routine follow-up next June, approximately 1 year following her original surgery.  I spent in excess of 15 minutes during the conduct of this office consultation and >50% of this time involved direct face-to-face encounter with the patient for counseling and/or coordination of their care.    Michelle Patton. Roxy Manns, MD 02/08/2015 4:49 PM

## 2015-02-10 ENCOUNTER — Encounter (HOSPITAL_COMMUNITY)
Admission: RE | Admit: 2015-02-10 | Discharge: 2015-02-10 | Disposition: A | Discharge status: Home / Self Care | Payer: BLUE CROSS/BLUE SHIELD | Source: Ambulatory Visit | Attending: Cardiology | Admitting: Cardiology

## 2015-02-10 DIAGNOSIS — I350 Nonrheumatic aortic (valve) stenosis: Secondary | ICD-10-CM | POA: Diagnosis not present

## 2015-02-12 ENCOUNTER — Encounter (HOSPITAL_COMMUNITY)
Admission: RE | Admit: 2015-02-12 | Discharge: 2015-02-12 | Disposition: A | Discharge status: Home / Self Care | Payer: BLUE CROSS/BLUE SHIELD | Source: Ambulatory Visit | Attending: Cardiology | Admitting: Cardiology

## 2015-02-12 DIAGNOSIS — I350 Nonrheumatic aortic (valve) stenosis: Secondary | ICD-10-CM | POA: Diagnosis not present

## 2015-02-15 ENCOUNTER — Ambulatory Visit: Payer: BLUE CROSS/BLUE SHIELD | Admitting: Thoracic Surgery (Cardiothoracic Vascular Surgery)

## 2015-02-15 ENCOUNTER — Encounter (HOSPITAL_COMMUNITY): Payer: BLUE CROSS/BLUE SHIELD

## 2015-02-17 ENCOUNTER — Encounter (HOSPITAL_COMMUNITY): Payer: BLUE CROSS/BLUE SHIELD

## 2015-02-19 ENCOUNTER — Encounter (HOSPITAL_COMMUNITY)
Admission: RE | Admit: 2015-02-19 | Discharge: 2015-02-19 | Disposition: A | Discharge status: Home / Self Care | Payer: BLUE CROSS/BLUE SHIELD | Source: Ambulatory Visit | Attending: Cardiology | Admitting: Cardiology

## 2015-02-19 ENCOUNTER — Encounter (HOSPITAL_COMMUNITY): Payer: BLUE CROSS/BLUE SHIELD

## 2015-02-19 DIAGNOSIS — I350 Nonrheumatic aortic (valve) stenosis: Secondary | ICD-10-CM | POA: Diagnosis not present

## 2015-02-22 ENCOUNTER — Encounter (HOSPITAL_COMMUNITY): Payer: BLUE CROSS/BLUE SHIELD

## 2015-03-05 NOTE — Progress Notes (Signed)
Cardiac Rehabilitation Program Outcomes Report   Orientation:  11/04/14 Graduate Date:  02/19/15 Discharge Date:  02/19/15 # of sessions completed: 36  Cardiologist: Harl Bowie Family MD:  Memory Argue Time:  0815  A.  Exercise Program:  Tolerates exercise @ 4.50 METS for 15 minutes, Walk Test Results:  Post: 3.47, Improved functional capacity  20.5 %, Improved  muscular strength  25.8 % and Improved  flexibility 2.143 %  B.  Mental Health:  Significant job stress, Significant family stress, Quality of Life (QOL)  improvements:  Overall  0 %, Health/Functioning 0 %, Socioeconomics 0 %, Psych/Spiritual 0 %, Family 0 %   and PHQ-9: 12. patient stated she did not want counseling. Exit PHQ9 highter than entrance due to lost of job and family member since started the program. patient refused to do the Quality of life questionarre.   C.  Education/Instruction/Skills  Accurately checks own pulse.  Rest:  80  Exercise:  89, Knows [facility] for exercise and Attended 13 education classes  Uses Perceived Exertion Scale and/or Dyspnea Scale  D.  Nutrition/Weight Control/Body Composition:  Adherence to prescribed nutrition program: poor and Patient has gained 6.8 kg   E.  Blood Lipids    Lab Results  Component Value Date   CHOL 181 09/26/2012   HDL 64 09/26/2012   LDLCALC 88 09/26/2012   TRIG 146 09/26/2012   CHOLHDL 2.8 09/26/2012    F.  Lifestyle Changes:  Continues to smoke and Needs physician encouragement on making lifestyle changes  G.  Symptoms noted with exercise:  Asymptomatic  Report Completed By:  Stevphen Rochester RN   Comments:  This is the patients graduation note for [facility].  Patient plans to exercise at home.

## 2015-03-05 NOTE — Progress Notes (Signed)
Patient is discharged from [place] and Pulmonary program today, 02/19/15 with 36 sessions.  She achieved LTG of 30 minutes of aerobic exercise at max met level of 4.50.  All patient vitals are WNL.  Patient has not met with dietician.  Discharge instructions have been reviewed in detail and patient expressed an understanding of material given.  Patient plans to exercise at home. Cardiac Rehab will make 1 month, 6 month and 1 year call backs.  Patient had no complaints of any abnormal S/S or pain on their exit visit.  Patient finished post walk test.

## 2015-04-29 ENCOUNTER — Ambulatory Visit: Payer: BLUE CROSS/BLUE SHIELD | Admitting: Cardiology

## 2015-05-28 ENCOUNTER — Ambulatory Visit (INDEPENDENT_AMBULATORY_CARE_PROVIDER_SITE_OTHER): Payer: BLUE CROSS/BLUE SHIELD | Admitting: Cardiology

## 2015-05-28 ENCOUNTER — Encounter: Payer: Self-pay | Admitting: Cardiology

## 2015-05-28 VITALS — BP 136/82 | HR 73 | Ht 66.0 in | Wt 213.8 lb

## 2015-05-28 DIAGNOSIS — R079 Chest pain, unspecified: Secondary | ICD-10-CM

## 2015-05-28 DIAGNOSIS — G4733 Obstructive sleep apnea (adult) (pediatric): Secondary | ICD-10-CM | POA: Diagnosis not present

## 2015-05-28 DIAGNOSIS — I10 Essential (primary) hypertension: Secondary | ICD-10-CM

## 2015-05-28 DIAGNOSIS — I739 Peripheral vascular disease, unspecified: Secondary | ICD-10-CM

## 2015-05-28 DIAGNOSIS — R5383 Other fatigue: Secondary | ICD-10-CM

## 2015-05-28 DIAGNOSIS — I350 Nonrheumatic aortic (valve) stenosis: Secondary | ICD-10-CM

## 2015-05-28 NOTE — Progress Notes (Signed)
Patient ID: Michelle Patton, female   DOB: [birth year], 60 y.o.   MRN: [id number]     Clinical Summary Michelle Patton is a 60 y.o.female seen today for follow up of the following medical problems.   1. Aortic stenosis -  hx in 1968 at age of 12 of supravavlular aortic stenosis, corrective surgery with patching at that time.  - recently found to have developed severe aortic valvular stenosis with possible recurrence of supravalvular stenosis as well based on imaging. 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  - Medtronic Freestyle stentless porcine valve/aortic root graft (size 21 mm, model # 995, serial # [id number])  - since last visit denies any significant SOB or DOE, no LE edema.    2. PAD - followed by vascular, history of left external iliac stent.  - no recent leg pain   3. HTN - compliant with meds. Had cough on ACE-I.   4. Fatigue - notes some generalized fatigue - denies any sleeping troubles. In bed around 9 to 930pm, waking up around 530AM.  - reports OSA sleep study around 7-10 years ago. Reports test was abnormal. Has not used CPAP machine for several years due to it being uncomfortable.   5. Chest pain - stabbing pain midchest and under left breast, 5/10. Mainly notices at rest, not with exertion. Not positional. No other associated symptoms. Lasts for just a few minutes typically. No relation to food. Same pain she had prior to surgery.  - does not seem to be stress related. No significant DOE.     SH: just started new job working at [employer].  Past Medical History  Diagnosis Date  . Hypertension   . Hyperlipidemia   . GERD (gastroesophageal reflux disease)   . DVT (deep venous thrombosis) ([place])   . Insomnia, unspecified   . Muscle weakness (generalized)   . Peripheral artery disease ([place])   . Aortic stenosis, severe 07/20/2014  . Aortic regurgitation  07/20/2014  . Supravalvular aortic stenosis, congenital - s/p repair during childhood   . Family history of adverse reaction to anesthesia     Reports father deliurm in his 63's with CABG  . History of pneumonia   . Sleep apnea     diagnosed multiple years ago at [facility]  . 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 Penicillins Hives    Have taken Keflex before without any adverse reaction.   . Sulfa Antibiotics Nausea And Vomiting     Current Outpatient Prescriptions  Medication Sig Dispense Refill  . aspirin EC 325 MG EC tablet Take 1 tablet (325 mg total) by mouth daily. 30 tablet 0  . Calcium Carb-Cholecalciferol (CALCIUM 600+D3) 600-800 MG-UNIT TABS Take 1 tablet by mouth at bedtime.    . Cholecalciferol (VITAMIN D3) 5000 UNITS TABS Take 5,000 Units by mouth at bedtime.    . clonazePAM (KLONOPIN) 0.5 MG tablet Take 1 tablet (0.5 mg total) by mouth at bedtime. (Patient taking differently: Take 0.5 mg by mouth daily. ) 30 tablet 5  . Cranberry-Vitamin C-Vitamin E (CRANBERRY PLUS VITAMIN C) 4200-20-3 MG-MG-UNIT CAPS Take 1 capsule by mouth at bedtime.    [name] estradiol (ESTRACE) 1 MG  tablet Take 1 mg by mouth at bedtime.    . furosemide (LASIX) 20 MG tablet Take 20 mg daily as needed for swelling 30 tablet 0  . Magnesium 250 MG TABS Take 250 mg by mouth at bedtime.    . medroxyPROGESTERone (PROVERA) 2.5 MG tablet Take 2.5 mg by mouth at bedtime.    . metoprolol tartrate (LOPRESSOR) 25 MG tablet Take 1 tablet (25 mg total) by mouth 2 (two) times daily. 180 tablet 3  . Multiple Vitamins-Minerals (MULTIVITAMIN WITH MINERALS) tablet Take 1 tablet by mouth at bedtime.     . Potassium 99 MG TABS Take 99 mg by mouth at bedtime. OTC    . RABEprazole (ACIPHEX) 20 MG tablet Take 20 mg  by mouth at bedtime.      No current facility-administered medications for this visit.     Past Surgical History  Procedure Laterality Date  . Iliac artery stent Left 12/2007  . Cardiac valve surgery  1968  . Cholecystectomy    . Cervical fusion    . Rotator cuff repair Bilateral   . Abdominal aortagram  06/24/12  . Abdominal aortagram N/A 06/24/2012    Procedure: ABDOMINAL Maxcine Ham;  Surgeon: Angelia Mould, MD;  Location: [facility] CATH LAB;  Service: Cardiovascular;  Laterality: N/A;  . Tee without cardioversion N/A 07/07/2014    Procedure: TRANSESOPHAGEAL ECHOCARDIOGRAM (TEE);  Surgeon: Arnoldo Lenis, MD;  Location: [facility] ENDO SU[facility];  Service: Cardiology;  Laterality: N/A;  . Tee without cardioversion N/A 07/20/2014    Procedure: TRANSESOPHAGEAL ECHOCARDIOGRAM (TEE) WITH PROPOFOL;  Surgeon: Arnoldo Lenis, MD;  Location: [facility] ORS;  Service: Endoscopy;  Laterality: N/A;  . Left and right heart catheterization with coronary angiogram N/A 07/31/2014    Procedure: LEFT AND RIGHT HEART CATHETERIZATION WITH CORONARY ANGIOGRAM;  Surgeon: Burnell Blanks, MD;  Location: [facility] CATH LAB;  Service: Cardiovascular;  Laterality: N/A;  . Aortic valve replacement N/A 10/07/2014    Procedure: REDO AORTIC VALVE REPLACEMENT (AVR);  Surgeon: Rexene Alberts, MD;  Location: [place];  Service: Open Heart Surgery;  Laterality: N/A;  . Tee without cardioversion N/A 10/07/2014    Procedure: TRANSESOPHAGEAL ECHOCARDIOGRAM (TEE);  Surgeon: Rexene Alberts, MD;  Location: [place];  Service: Open Heart Surgery;  Laterality: N/A;  . Ascending aortic root replacement N/A 10/07/2014    Procedure: ASCENDING AORTIC ROOT REPLACEMENT;  Surgeon: Rexene Alberts, MD;  Location: [place];  Service: Open Heart Surgery;  Laterality: N/A;     Allergies  Allergen Reactions  . Penicillins Hives    Have taken Keflex before without any adverse reaction.   . Sulfa Antibiotics Nausea And Vomiting      Family History  Problem  Relation Age of Onset  . Hypertension Mother   . Hypertension Father   . Heart disease Father     before age 57  . Other Father     varicose veins     Social History Michelle Patton reports that she quit smoking about 8 months ago. Her smoking use included Cigarettes. She smoked 0.75 packs per day. She has never used smokeless tobacco. Michelle Patton reports that she drinks about 2.4 oz of alcohol per week.   Review of Systems CONSTITUTIONAL: + fatigue.  HEENT: Eyes: No visual loss, blurred vision, double vision or yellow sclerae.No hearing loss, sneezing, congestion, runny nose or sore throat.  SKIN: No rash or itching.  CARDIOVASCULAR: per hpi RESPIRATORY: No shortness of breath, cough or sputum.  GASTROINTESTINAL: No  anorexia, nausea, vomiting or diarrhea. No abdominal pain or blood.  GENITOURINARY: No burning on urination, no polyuria NEUROLOGICAL: No headache, dizziness, syncope, paralysis, ataxia, numbness or tingling in the extremities. No change in bowel or bladder control.  MUSCULOSKELETAL: No muscle, back pain, joint pain or stiffness.  LYMPHATICS: No enlarged nodes. No history of splenectomy.  PSYCHIATRIC: No history of depression or anxiety.  ENDOCRINOLOGIC: No reports of sweating, cold or heat intolerance. No polyuria or polydipsia.  [name]   Physical Examination Filed Vitals:   05/28/15 1624  BP: 136/82  Pulse: 73   Filed Vitals:   05/28/15 1624  Height: 5\' 6"  (1.676 m)  Weight: 213 lb 12.8 oz (96.979 kg)    Gen: resting comfortably, no acute distress HEENT: no scleral icterus, pupils equal round and reactive, no palptable cervical adenopathy,  CV: RRR, 2/6 systolic murmur RUSB, no jvd Resp: Clear to auscultation bilaterally GI: abdomen is soft, non-tender, non-distended, normal bowel sounds, no hepatosplenomegaly MSK: extremities are warm, no edema.  Skin: warm, no rash Neuro:  no focal deficits Psych: appropriate affect     Assessment and Plan   1. Aortic  stenosis - s/p tissue AVR and aortic root graft placement - doing well, we will repeat her echo as she has not had one since her surgery.   2. HTN -at goal, continue current meds  3. PAD - no current symptoms, continue to monitor  4.  Chest pain - long history of intermittent chest pain. Negative cath prior to her recent AVR. She will try zantac bid and follow symptoms  5. Fatigue - likely due to her lack of compliance with CPAP and her OSA. Several years since she has worn CPAP, we will refer to Dr Luan Pulling to help manage her OSA.   F/u 6 months      Arnoldo Lenis, M.D.

## 2015-05-28 NOTE — Patient Instructions (Signed)
Your physician wants you to follow-up in: 6 months with Dr. Bryna Colander will receive a reminder letter in the mail two months in advance. If you don't receive a letter, please call our office to schedule the follow-up appointment.  Your physician has recommended you make the following change in your medication:   DECREASE ASPIRIN 81 MG DAILY  START OTC ZANTAC 150 MG TWICE DAILY  Your physician has requested that you have an echocardiogram. Echocardiography is a painless test that uses sound waves to create images of your heart. It provides your doctor with information about the size and shape of your heart and how well your heart's chambers and valves are working. This procedure takes approximately one hour. There are no restrictions for this procedure.  You have been referred to DR. HAWKINS   Thank you for choosing [facility]!!

## 2015-06-01 ENCOUNTER — Other Ambulatory Visit: Payer: Self-pay | Admitting: *Deleted

## 2015-06-01 DIAGNOSIS — G4730 Sleep apnea, unspecified: Secondary | ICD-10-CM

## 2015-07-22 ENCOUNTER — Other Ambulatory Visit: Payer: BLUE CROSS/BLUE SHIELD

## 2015-10-11 ENCOUNTER — Encounter: Payer: BLUE CROSS/BLUE SHIELD | Admitting: Thoracic Surgery (Cardiothoracic Vascular Surgery)

## 2015-10-25 ENCOUNTER — Emergency Department (HOSPITAL_COMMUNITY)
Admission: EM | Admit: 2015-10-25 | Discharge: 2015-10-25 | Disposition: A | Discharge status: Home / Self Care | Payer: No Typology Code available for payment source | Attending: Emergency Medicine | Admitting: Emergency Medicine

## 2015-10-25 ENCOUNTER — Encounter (HOSPITAL_COMMUNITY): Payer: Self-pay | Admitting: Emergency Medicine

## 2015-10-25 ENCOUNTER — Emergency Department (HOSPITAL_COMMUNITY): Payer: No Typology Code available for payment source

## 2015-10-25 DIAGNOSIS — Y92410 Unspecified street and highway as the place of occurrence of the external cause: Secondary | ICD-10-CM | POA: Insufficient documentation

## 2015-10-25 DIAGNOSIS — Z7982 Long term (current) use of aspirin: Secondary | ICD-10-CM | POA: Insufficient documentation

## 2015-10-25 DIAGNOSIS — Z79899 Other long term (current) drug therapy: Secondary | ICD-10-CM | POA: Insufficient documentation

## 2015-10-25 DIAGNOSIS — Y999 Unspecified external cause status: Secondary | ICD-10-CM | POA: Diagnosis not present

## 2015-10-25 DIAGNOSIS — F17210 Nicotine dependence, cigarettes, uncomplicated: Secondary | ICD-10-CM | POA: Insufficient documentation

## 2015-10-25 DIAGNOSIS — I10 Essential (primary) hypertension: Secondary | ICD-10-CM | POA: Insufficient documentation

## 2015-10-25 DIAGNOSIS — R079 Chest pain, unspecified: Secondary | ICD-10-CM | POA: Diagnosis present

## 2015-10-25 DIAGNOSIS — Y9389 Activity, other specified: Secondary | ICD-10-CM | POA: Insufficient documentation

## 2015-10-25 DIAGNOSIS — R51 Headache: Secondary | ICD-10-CM | POA: Diagnosis not present

## 2015-10-25 DIAGNOSIS — E785 Hyperlipidemia, unspecified: Secondary | ICD-10-CM | POA: Diagnosis not present

## 2015-10-25 LAB — I-STAT CHEM 8, ED
BUN: 17 mg/dL (ref 6–20)
Calcium, Ion: 1.15 mmol/L (ref 1.13–1.30)
Chloride: 104 mmol/L (ref 101–111)
Creatinine, Ser: 1.1 mg/dL — ABNORMAL HIGH (ref 0.44–1.00)
Glucose, Bld: 101 mg/dL — ABNORMAL HIGH (ref 65–99)
HCT: 42 % (ref 36.0–46.0)
Hemoglobin: 14.3 g/dL (ref 12.0–15.0)
Potassium: 3.6 mmol/L (ref 3.5–5.1)
Sodium: 141 mmol/L (ref 135–145)
TCO2: 24 mmol/L (ref 0–100)

## 2015-10-25 MED ORDER — IOPAMIDOL (ISOVUE-300) INJECTION 61%
75.0000 mL | Freq: Once | INTRAVENOUS | Status: AC | PRN
  Administered 2015-10-25: 75 mL via INTRAVENOUS

## 2015-10-25 MED ORDER — HYDROCODONE-ACETAMINOPHEN 5-325 MG PO TABS: 1.0000 | ORAL_TABLET | Freq: Four times a day (QID) | ORAL | Status: DC | PRN

## 2015-10-25 MED ORDER — ONDANSETRON HCL 4 MG/2ML IJ SOLN
4.0000 mg | Freq: Once | INTRAMUSCULAR | Status: AC
  Administered 2015-10-25: 4 mg via INTRAVENOUS
  Filled 2015-10-25: qty 2

## 2015-10-25 MED ORDER — HYDROMORPHONE HCL 1 MG/ML IJ SOLN
1.0000 mg | Freq: Once | INTRAMUSCULAR | Status: AC
  Administered 2015-10-25: 1 mg via INTRAVENOUS
  Filled 2015-10-25: qty 1

## 2015-10-25 NOTE — Discharge Instructions (Signed)
Follow up with your family md next week if not improving °

## 2015-10-25 NOTE — ED Notes (Signed)
Patient states pain to chest area is worse with movement and palpation.
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
## 2015-11-11 ENCOUNTER — Ambulatory Visit (INDEPENDENT_AMBULATORY_CARE_PROVIDER_SITE_OTHER): Payer: 59 | Admitting: Cardiology

## 2015-11-11 ENCOUNTER — Encounter: Payer: Self-pay | Admitting: Cardiology

## 2015-11-11 VITALS — BP 112/68 | HR 76 | Ht 67.0 in | Wt 214.0 lb

## 2015-11-11 DIAGNOSIS — I739 Peripheral vascular disease, unspecified: Secondary | ICD-10-CM | POA: Diagnosis not present

## 2015-11-11 DIAGNOSIS — R079 Chest pain, unspecified: Secondary | ICD-10-CM

## 2015-11-11 DIAGNOSIS — I350 Nonrheumatic aortic (valve) stenosis: Secondary | ICD-10-CM | POA: Diagnosis not present

## 2015-11-11 DIAGNOSIS — I10 Essential (primary) hypertension: Secondary | ICD-10-CM | POA: Diagnosis not present

## 2015-11-11 NOTE — Patient Instructions (Signed)
Medication Instructions:  Your physician recommends that you continue on your current medications as directed. Please refer to the Current Medication list given to you today.   Labwork: NONE  Testing/Procedures: Your physician has requested that you have an echocardiogram. Echocardiography is a painless test that uses sound waves to create images of your heart. It provides your doctor with information about the size and shape of your heart and how well your heart's chambers and valves are working. This procedure takes approximately one hour. There are no restrictions for this procedure.    Follow-Up: Your physician recommends that you schedule a follow-up appointment in: TO BE DETERMINED    Any Other Special Instructions Will Be Listed Below (If Applicable).     If you need a refill on your cardiac medications before your next appointment, please call your pharmacy.  3
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
## 2015-11-14 IMAGING — CR DG CHEST 2V
2 series · 2 of 2 positions shown · non-contrast
Comparison: September 09, 2014

CLINICAL DATA: Aortic stenosis.  Preoperative evaluation.

EXAM:
CHEST  2 VIEW

[w chest pa]
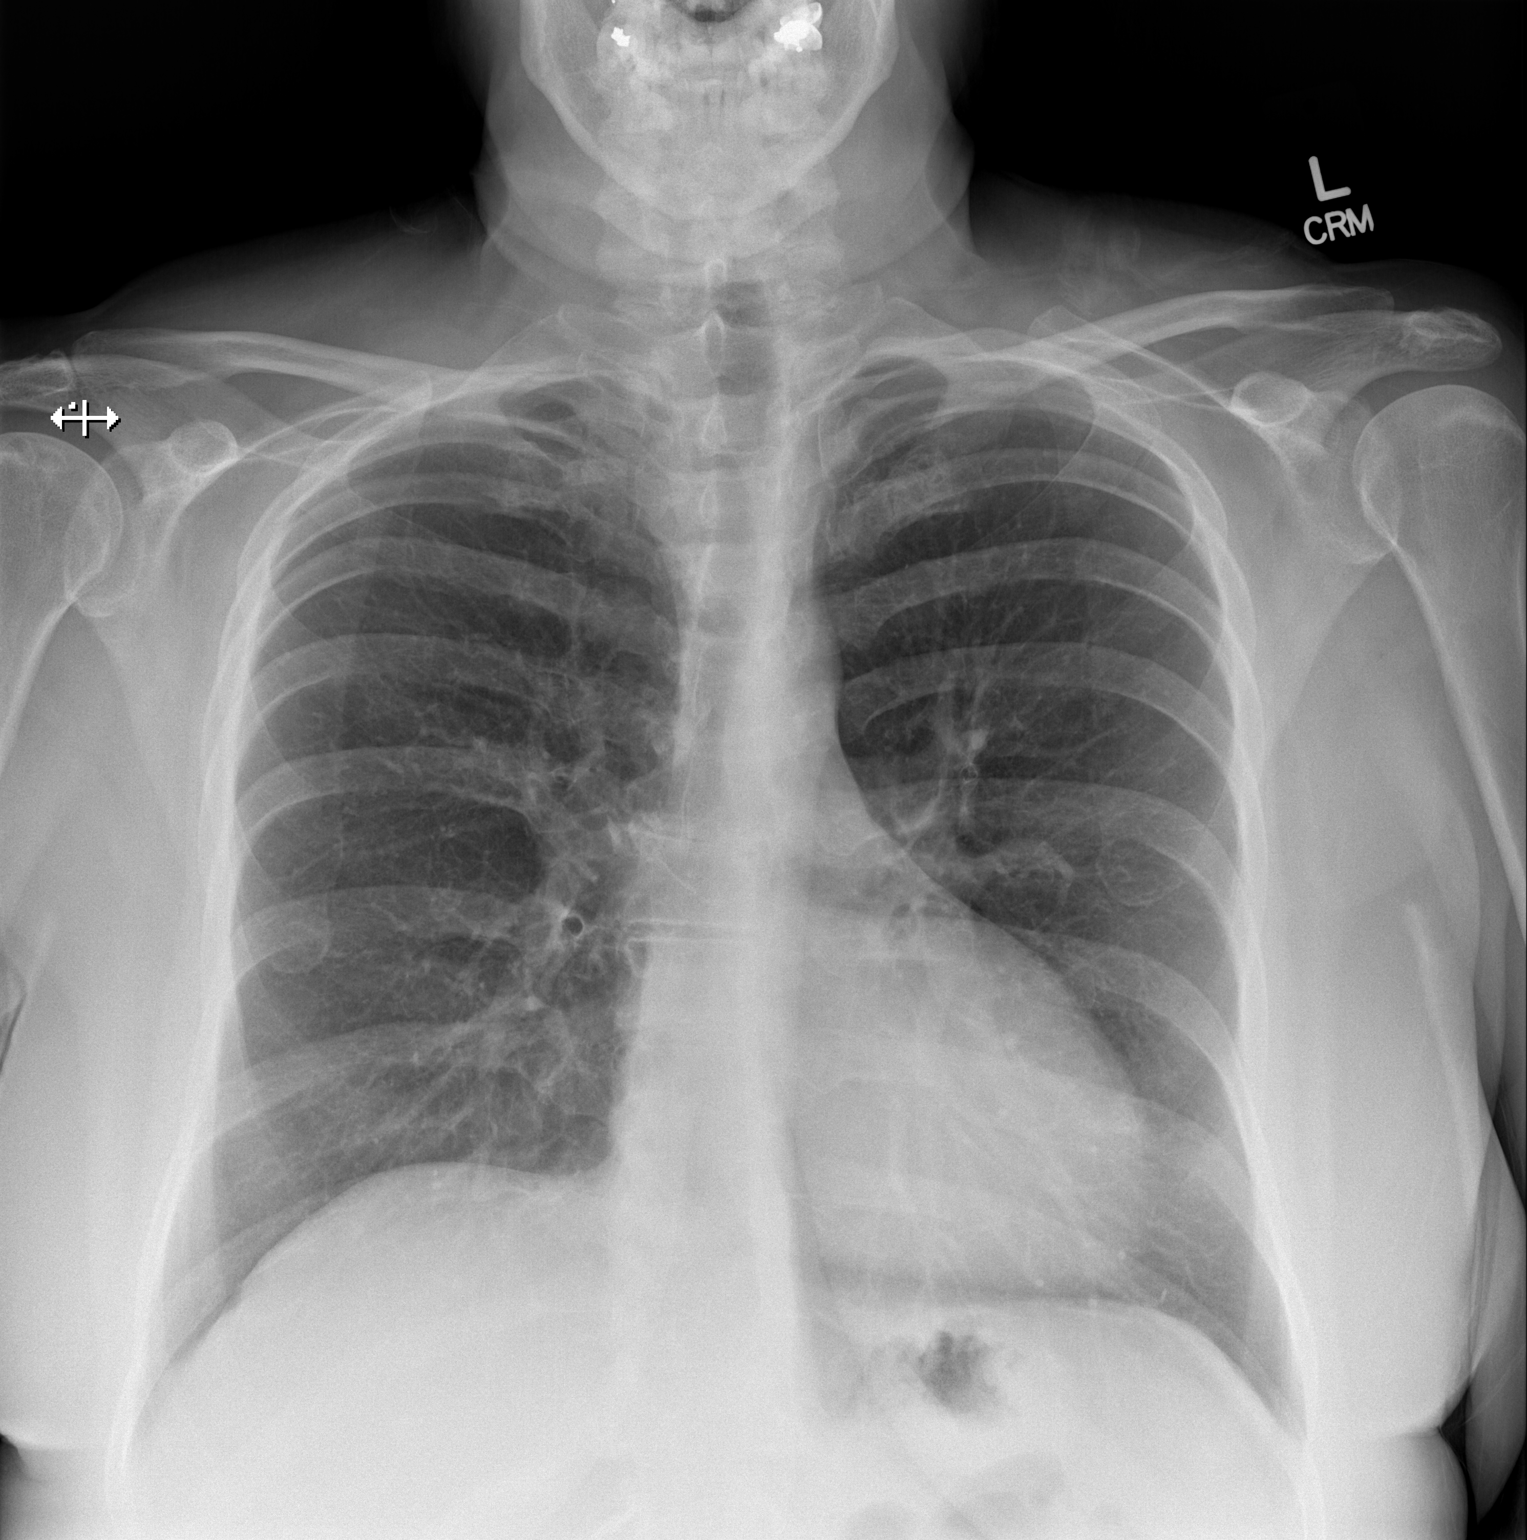

[w chest lat]
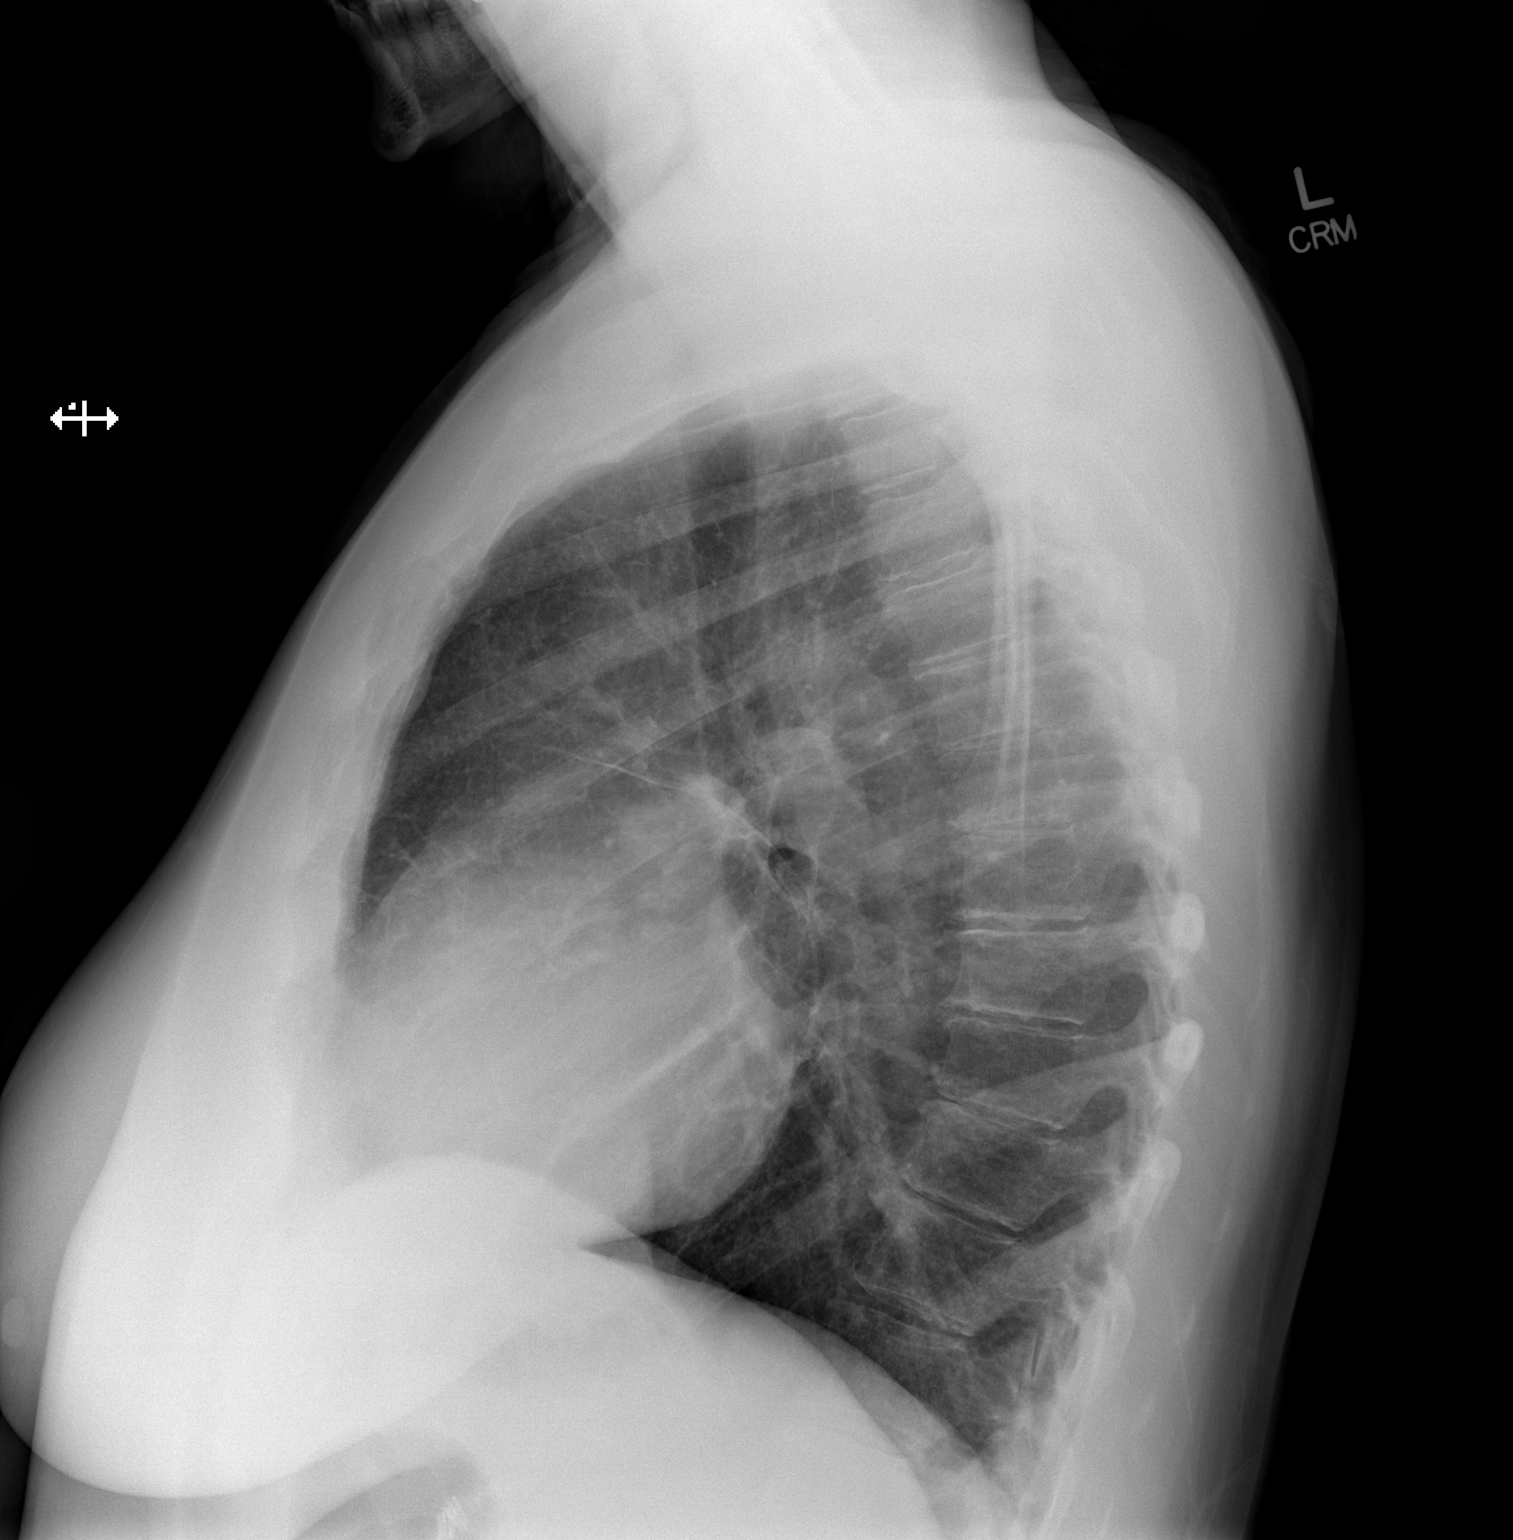

[2 of 2 positions shown; findings below may reference images not displayed]

FINDINGS: Lungs are clear. The heart size and pulmonary vascularity are
normal. No adenopathy. No bone lesions. There is slight upper
thoracic levoscoliosis.
IMPRESSION: No edema or consolidation.

## 2015-11-17 IMAGING — CR DG CHEST 1V PORT
1 series · 1 of 1 positions shown · non-contrast
Comparison: None.

CLINICAL DATA: Atelectasis.

EXAM:
PORTABLE CHEST - 1 VIEW

[AP]
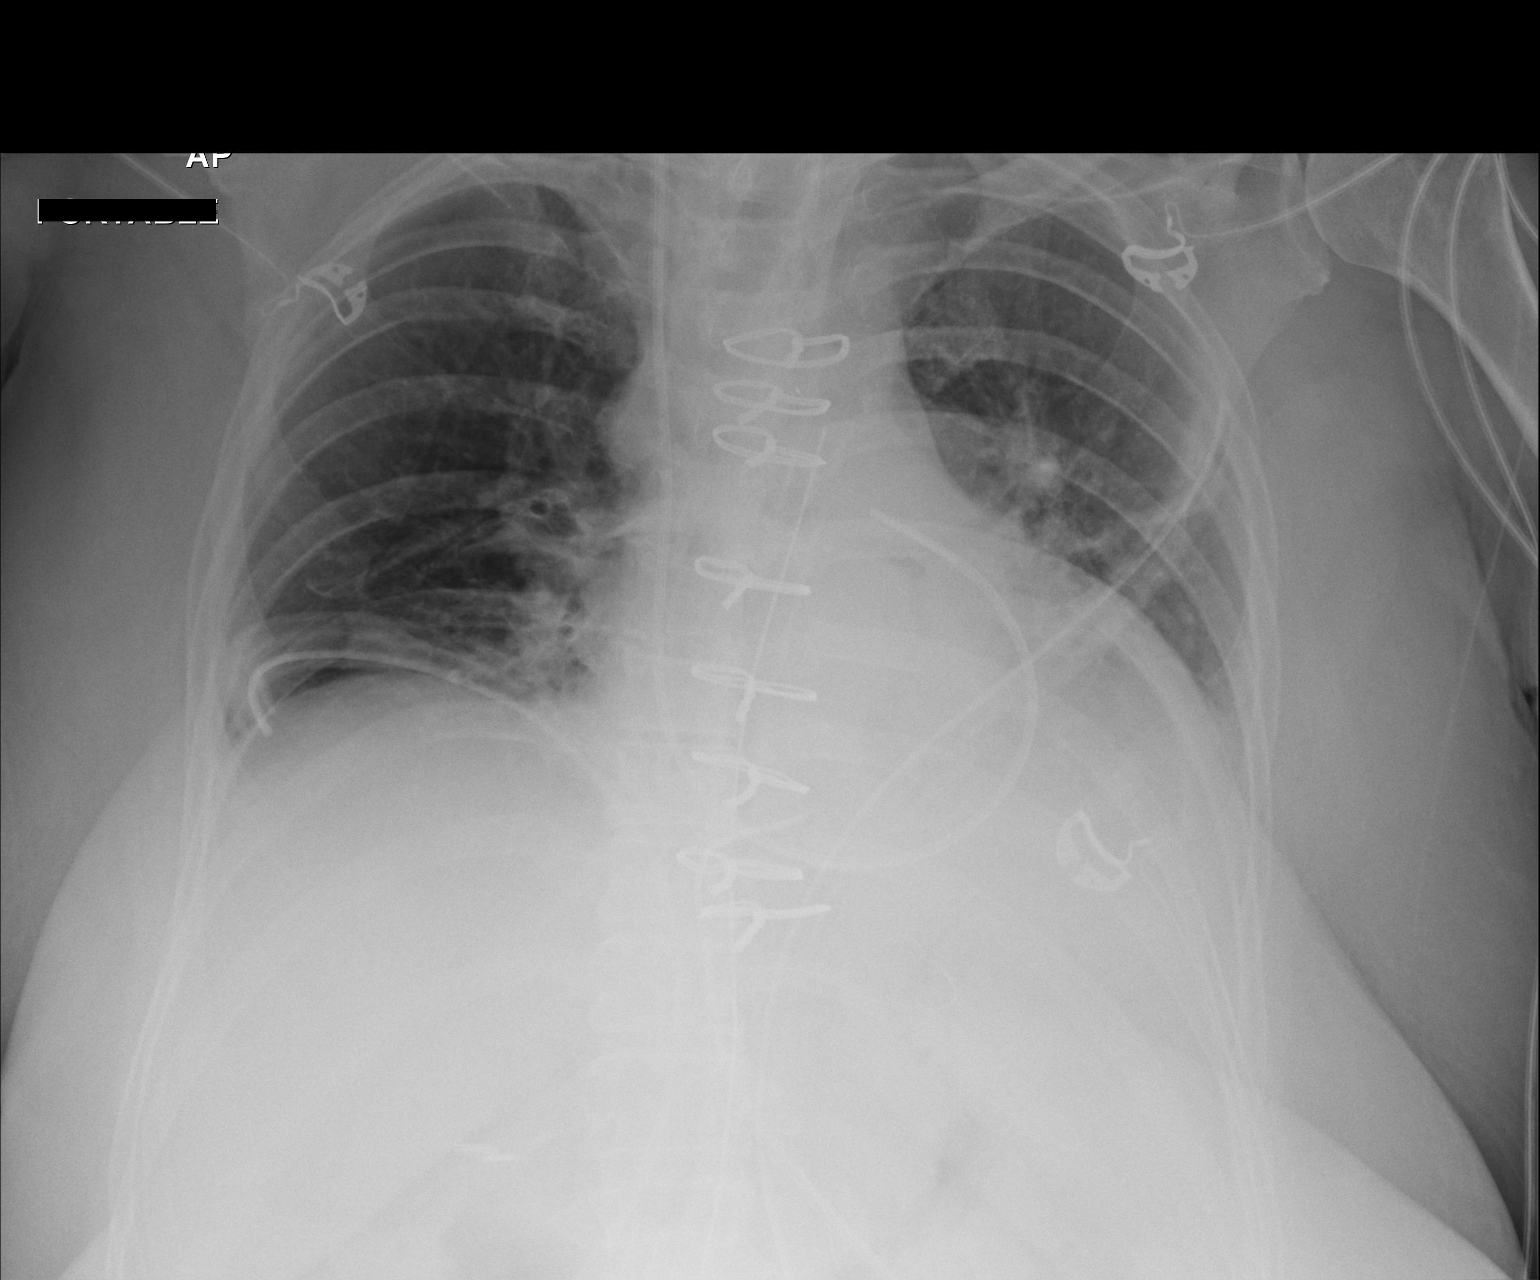

[1 of 1 positions shown; findings below may reference images not displayed]

FINDINGS: Interim extubation and removal of NG tube. Swan-Ganz catheter,
mediastinal drainage catheter, right chest tube in stable position.
Prior median sternotomy. Stable cardiomegaly. Normal pulmonary
vascularity. Low lung volumes with basilar atelectasis. Small left
pleural effusion cannot be excluded. No pneumothorax.
IMPRESSION: 1. Interim extubation removal of NG tube. Remaining lines and tubes
in stable position.
2. Low lung volumes with basilar atelectasis. Small left pleural
effusion cannot be excluded.
3. Stable cardiomegaly.  Prior median sternotomy.

## 2015-11-18 IMAGING — CR DG CHEST 1V PORT
1 series · 1 of 1 positions shown · non-contrast
Comparison: 10/08/2014.

CLINICAL DATA: Aortic valve replacement.

EXAM:
PORTABLE CHEST - 1 VIEW

[AP]
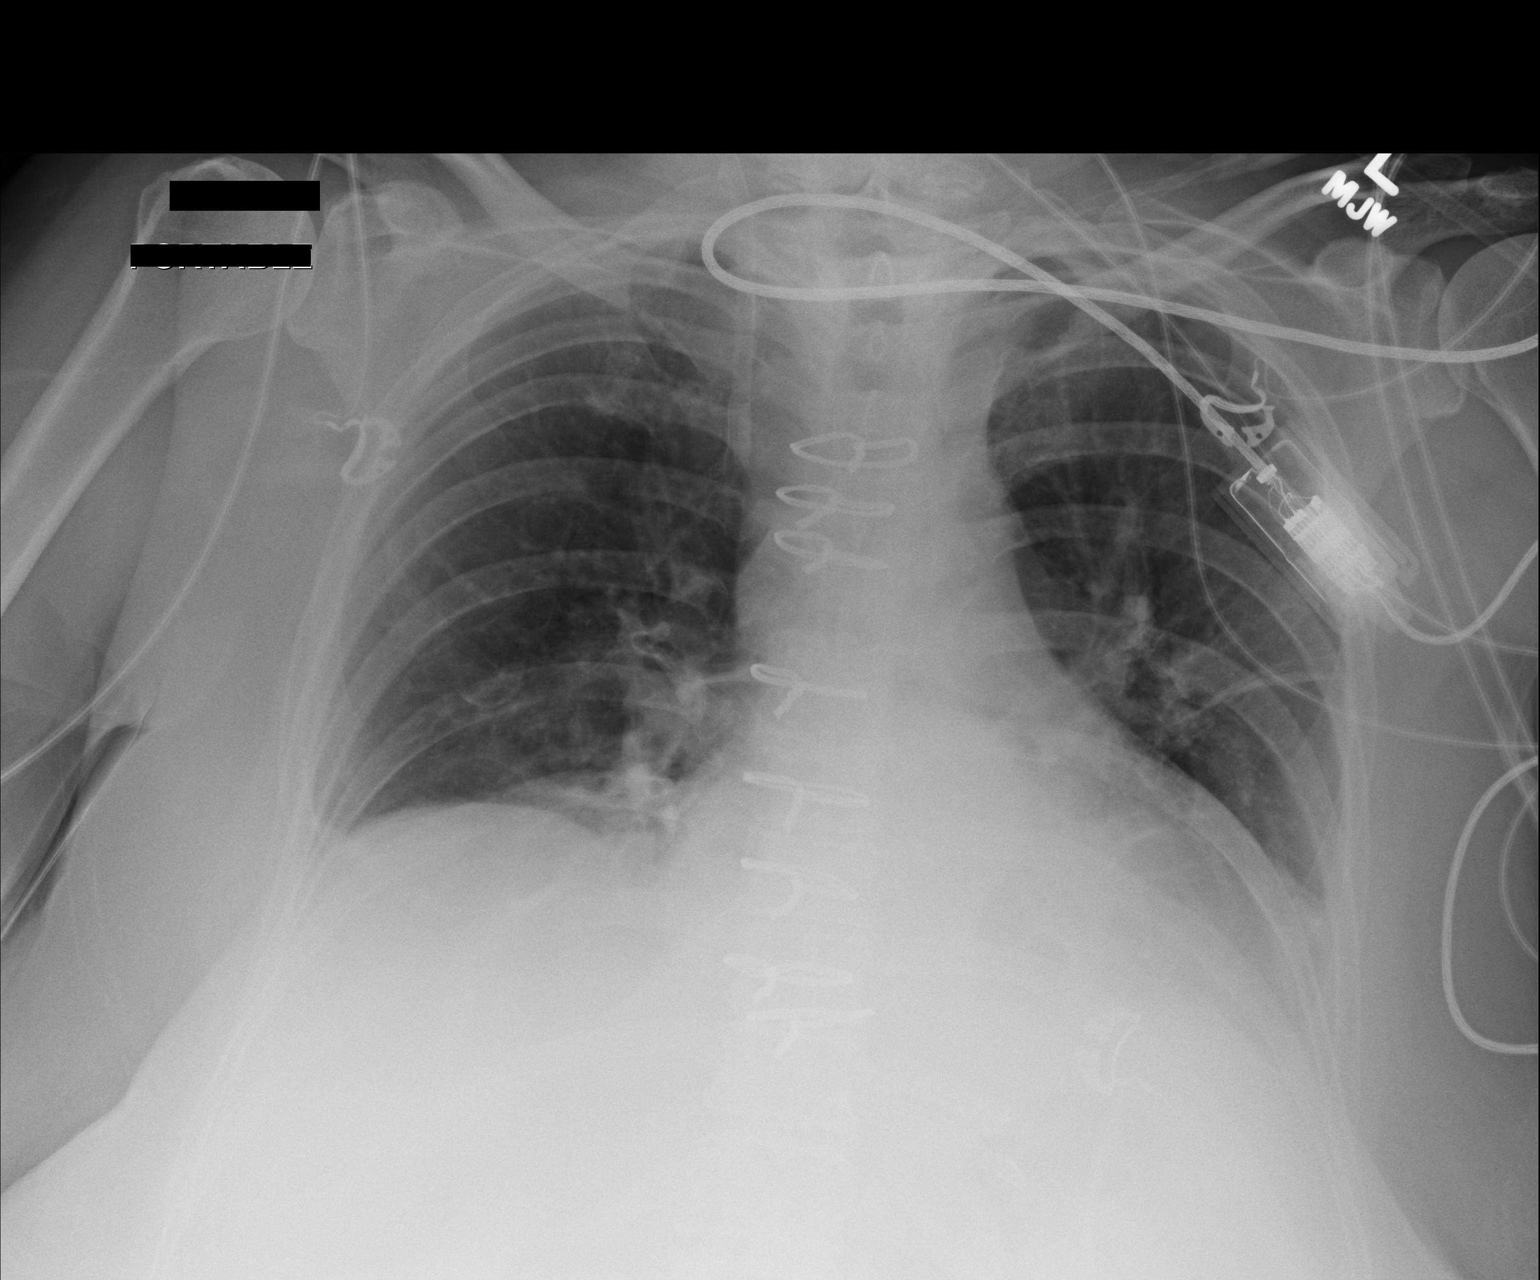

[1 of 1 positions shown; findings below may reference images not displayed]

FINDINGS: Interim removal of bilateral chest tubes, mediastinal drainage
catheter, Swan-Ganz catheter. Right IJ sheath in good anatomic
position. Prior median sternotomy. Heart size stable. Low lung
volumes with basilar atelectasis. Small pleural effusions cannot be
excluded. Tiny left apical pneumothorax cannot be excluded.
IMPRESSION: 1. Interim removal of bilateral chest tubes, mediastinal drainage
catheter, Swan-Ganz catheter. Right IJ sheath in good anatomic
position. Tiny left apical pneumothorax cannot be excluded.
2. Low lung volumes with bibasilar atelectasis. Small pleural
effusions cannot be excluded.
3. Prior median sternotomy. Heart size is stable. Critical
Value/emergent results were called by telephone at the time of
interpretation on 10/09/2014 at [DATE] to nurse Ouchaou, who verbally
acknowledged these results.

## 2015-12-06 ENCOUNTER — Ambulatory Visit (INDEPENDENT_AMBULATORY_CARE_PROVIDER_SITE_OTHER): Payer: 59 | Admitting: Thoracic Surgery (Cardiothoracic Vascular Surgery)

## 2015-12-06 ENCOUNTER — Encounter: Payer: Self-pay | Admitting: Thoracic Surgery (Cardiothoracic Vascular Surgery)

## 2015-12-06 VITALS — BP 124/85 | HR 76 | Resp 15 | Ht 67.0 in | Wt 214.0 lb

## 2015-12-06 DIAGNOSIS — Z954 Presence of other heart-valve replacement: Secondary | ICD-10-CM | POA: Diagnosis not present

## 2015-12-06 NOTE — Patient Instructions (Signed)
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
## 2015-12-09 ENCOUNTER — Ambulatory Visit (INDEPENDENT_AMBULATORY_CARE_PROVIDER_SITE_OTHER): Payer: 59

## 2015-12-09 ENCOUNTER — Other Ambulatory Visit: Payer: Self-pay

## 2015-12-09 DIAGNOSIS — I350 Nonrheumatic aortic (valve) stenosis: Secondary | ICD-10-CM

## 2015-12-09 LAB — ECHOCARDIOGRAM COMPLETE
AV Area VTI: 1.83 cm2
AV Peak grad: 19 mmHg
AV peak Index: 0.88
AV pk vel: 216 cm/s
Ao pk vel: 0.64 m/s
CHL CUP MV DEC (S): 366
CHL CUP STROKE VOLUME: 46 mL
EERAT: 6.19
EWDT: 366 ms
FS: 45 % — AB (ref 28–44)
IVS/LV PW RATIO, ED: 1.03
LA ID, A-P, ES: 34 mm
LA vol A4C: 34.3 ml
LA vol index: 17.1 mL/m2
LA vol: 35.5 mL
LADIAMINDEX: 1.63 cm/m2
LEFT ATRIUM END SYS DIAM: 34 mm
LV E/e'average: 6.19
LV SIMPSON'S DISK: 61
LV TDI E'LATERAL: 11.7
LV TDI E'MEDIAL: 6.09
LV dias vol index: 37 mL/m2
LV dias vol: 76 mL (ref 46–106)
LV e' LATERAL: 11.7 cm/s
LV sys vol index: 14 mL/m2
LVEEMED: 6.19
LVOT SV: 82 mL
LVOT VTI: 28.7 cm
LVOT area: 2.84 cm2
LVOT peak grad rest: 8 mmHg
LVOTD: 19 mm
LVOTPV: 139 cm/s
LVSYSVOL: 30 mL (ref 14–42)
MVPG: 2 mmHg
MVPKAVEL: 95.4 m/s
MVPKEVEL: 72.4 m/s
PW: 10.6 mm — AB (ref 0.6–1.1)
TAPSE: 12 mm

## 2015-12-13 ENCOUNTER — Encounter: Payer: BLUE CROSS/BLUE SHIELD | Admitting: Thoracic Surgery (Cardiothoracic Vascular Surgery)

## 2015-12-15 ENCOUNTER — Telehealth: Payer: Self-pay | Admitting: *Deleted

## 2015-12-15 NOTE — Telephone Encounter (Signed)
-----   Message from Arnoldo Lenis, MD sent at 12/10/2015 12:28 PM EDT ----- Echo looks good, her valve looks and is working well. Staci please update patient, I will forward to her CT surgeon Dr Roxy Manns.   J BrancH MD

## 2015-12-15 NOTE — Telephone Encounter (Signed)
Pt aware, routed to pcp 

## 2015-12-21 DIAGNOSIS — Z736 Limitation of activities due to disability: Secondary | ICD-10-CM

## 2016-03-01 ENCOUNTER — Other Ambulatory Visit: Payer: Self-pay | Admitting: Cardiology

## 2016-03-01 MED ORDER — METOPROLOL TARTRATE 25 MG PO TABS: 25.0000 mg | ORAL_TABLET | Freq: Two times a day (BID) | ORAL | 3 refills | Status: DC

## 2016-03-01 NOTE — Telephone Encounter (Signed)
Refill:   metoprolol tartrate (LOPRESSOR) 25 MG  [facility]

## 2016-03-01 NOTE — Telephone Encounter (Signed)
Medication sent to pharmacy  

## 2016-08-10 ENCOUNTER — Encounter: Payer: Self-pay | Admitting: Gastroenterology

## 2016-08-10 ENCOUNTER — Ambulatory Visit (INDEPENDENT_AMBULATORY_CARE_PROVIDER_SITE_OTHER): Payer: 59 | Admitting: Gastroenterology

## 2016-08-10 ENCOUNTER — Other Ambulatory Visit: Payer: Self-pay

## 2016-08-10 VITALS — BP 111/71 | HR 60 | Temp 98.4°F | Ht 66.0 in | Wt 226.4 lb

## 2016-08-10 DIAGNOSIS — R1310 Dysphagia, unspecified: Secondary | ICD-10-CM

## 2016-08-10 DIAGNOSIS — R10A1 Flank pain, right side: Secondary | ICD-10-CM | POA: Insufficient documentation

## 2016-08-10 DIAGNOSIS — R109 Unspecified abdominal pain: Secondary | ICD-10-CM

## 2016-08-10 DIAGNOSIS — R197 Diarrhea, unspecified: Secondary | ICD-10-CM

## 2016-08-10 DIAGNOSIS — R1013 Epigastric pain: Secondary | ICD-10-CM

## 2016-08-10 DIAGNOSIS — K2100 Gastro-esophageal reflux disease with esophagitis, without bleeding: Secondary | ICD-10-CM

## 2016-08-10 DIAGNOSIS — K219 Gastro-esophageal reflux disease without esophagitis: Secondary | ICD-10-CM

## 2016-08-10 DIAGNOSIS — R1319 Other dysphagia: Secondary | ICD-10-CM

## 2016-08-10 LAB — CBC WITH DIFFERENTIAL/PLATELET
BASOS ABS: 79 {cells}/uL (ref 0–200)
Basophils Relative: 1 %
EOS ABS: 158 {cells}/uL (ref 15–500)
Eosinophils Relative: 2 %
HEMATOCRIT: 42.7 % (ref 35.0–45.0)
Hemoglobin: 14.1 g/dL (ref 11.7–15.5)
LYMPHS ABS: 2449 {cells}/uL (ref 850–3900)
LYMPHS PCT: 31 %
MCH: 29.9 pg (ref 27.0–33.0)
MCHC: 33 g/dL (ref 32.0–36.0)
MCV: 90.7 fL (ref 80.0–100.0)
MONO ABS: 711 {cells}/uL (ref 200–950)
MPV: 9.4 fL (ref 7.5–12.5)
Monocytes Relative: 9 %
Neutro Abs: 4503 cells/uL (ref 1500–7800)
Neutrophils Relative %: 57 %
Platelets: 234 10*3/uL (ref 140–400)
RBC: 4.71 MIL/uL (ref 3.80–5.10)
RDW: 12.9 % (ref 11.0–15.0)
WBC: 7.9 10*3/uL (ref 3.8–10.8)

## 2016-08-10 MED ORDER — NA SULFATE-K SULFATE-MG SULF 17.5-3.13-1.6 GM/177ML PO SOLN: 1.0000 | ORAL | 0 refills | Status: DC

## 2016-08-10 NOTE — Progress Notes (Signed)
Primary Care Physician:  Octavio Graves, DO  Primary Gastroenterologist:  Garfield Cornea, MD   Chief Complaint  Patient presents with  . Gastroesophageal Reflux  . Diarrhea    HPI:  Michelle Patton is a 61 y.o. female here at the request of PCP for further evaluation of GERD and diarrhea.   Patient has history of ERE, noncritical appearing Schatki's ring, small hh in 2011. Was put on Aciphex at that time. Lost her insurance coverage due to heart surgery in 2016 and was no longer able to get her Aciphex. Since her heart surgery she has gained 30 pounds. Complains of severe nausea, drinks a gallon of water a day but carbonated lemon or ginger water. Complains of epigastric pain, esophageal dysphagia, left sided abd pain.  Has had diarrhea since off aciphex. No solid stools. No melena, brbpr. No NSAIDS/ASA. 4-5 explosive BMS daily, with urgency. No nocturnal BMs. If stomach empty then severe burning and nausea.   Tried omeprazole, ranitidine, prevalite with failure.   Recently through [facility] she was able to get aciphex, 90 days for $60. Back on for 2 weeks. Fecal urgency somewhat better. Stomach pain improved. 50% better on aciphex. Diarrhea a lot better.   Last TCS in [place] more than 12 years ago.       Current Outpatient Prescriptions  Medication Sig Dispense Refill  . aspirin 81 MG tablet Take 81 mg by mouth daily.    . Calcium Carb-Cholecalciferol (CALCIUM 600+D3) 600-800 MG-UNIT TABS Take 1 tablet by mouth every morning.     . Cholecalciferol (VITAMIN D3) 5000 UNITS TABS Take 5,000 Units by mouth every morning.     . clonazePAM (KLONOPIN) 0.5 MG tablet Take 1 tablet (0.5 mg total) by mouth at bedtime. 30 tablet 5  . Cranberry-Vitamin C-Vitamin E (CRANBERRY PLUS VITAMIN C) 4200-20-3 MG-MG-UNIT CAPS Take 1 capsule by mouth every morning.     [name] estradiol (ESTRACE) 1 MG tablet Take 1 mg by mouth every morning.     . Magnesium 250 MG TABS Take 250 mg by mouth every morning.     .  medroxyPROGESTERone (PROVERA) 2.5 MG tablet Take 2.5 mg by mouth every morning.     . metoprolol tartrate (LOPRESSOR) 25 MG tablet Take 1 tablet (25 mg total) by mouth 2 (two) times daily. 180 tablet 3  . Multiple Vitamins-Minerals (MULTIVITAMIN WITH MINERALS) tablet Take 1 tablet by mouth every morning.     . Potassium 99 MG TABS Take 99 mg by mouth every morning. OTC    . RABEprazole (ACIPHEX) 20 MG tablet Take 20 mg by mouth daily.    [name] zolpidem (AMBIEN) 10 MG tablet Take 10 mg by mouth at bedtime as needed for sleep.      No current facility-administered medications for this visit.     Allergies as of 08/10/2016 - Review Complete 08/10/2016  Allergen Reaction Noted  . Penicillins Hives   . Sulfa antibiotics Nausea And Vomiting 06/19/2012    Past Medical History:  Diagnosis Date  . Aortic regurgitation 07/20/2014  . Aortic stenosis, severe 07/20/2014  . DVT (deep venous thrombosis) ([place])   . Family history of adverse reaction to anesthesia    Reports father deliurm in his 35's with CABG  . GERD (gastroesophageal reflux disease)   . History of pneumonia   . Hyperlipidemia   . Hypertension   . Insomnia, unspecified   . Muscle weakness (generalized)   . Peripheral artery disease ([place])   . 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 Sleep apnea    diagnosed multiple years ago at [facility]  . Supravalvular aortic stenosis, congenital - s/p repair during childhood     Past Surgical History:  Procedure Laterality Date  . ABDOMINAL AORTAGRAM  06/24/12  . ABDOMINAL AORTAGRAM N/A 06/24/2012   Procedure: ABDOMINAL Maxcine Ham;  Surgeon: Angelia Mould, MD;  Location: [facility] CATH LAB;  Service: Cardiovascular;  Laterality: N/A;  . AORTIC VALVE REPLACEMENT N/A 10/07/2014   Procedure: REDO AORTIC VALVE REPLACEMENT  (AVR);  Surgeon: Rexene Alberts, MD;  Location: [place];  Service: Open Heart Surgery;  Laterality: N/A;  . ASCENDING AORTIC ROOT REPLACEMENT N/A 10/07/2014   Procedure: ASCENDING AORTIC ROOT REPLACEMENT;  Surgeon: Rexene Alberts, MD;  Location: [place];  Service: Open Heart Surgery;  Laterality: N/A;  . CARDIAC VALVE SURGERY  1968  . CERVICAL FUSION    . CHOLECYSTECTOMY    . ILIAC ARTERY STENT Left 12/2007  . LEFT AND RIGHT HEART CATHETERIZATION WITH CORONARY ANGIOGRAM N/A 07/31/2014   Procedure: LEFT AND RIGHT HEART CATHETERIZATION WITH CORONARY ANGIOGRAM;  Surgeon: Burnell Blanks, MD;  Location: [facility] CATH LAB;  Service: Cardiovascular;  Laterality: N/A;  . ROTATOR CUFF REPAIR Bilateral   . TEE WITHOUT CARDIOVERSION N/A 07/07/2014   Procedure: TRANSESOPHAGEAL ECHOCARDIOGRAM (TEE);  Surgeon: Arnoldo Lenis, MD;  Location: [facility];  Service: Cardiology;  Laterality: N/A;  . TEE WITHOUT CARDIOVERSION N/A 07/20/2014   Procedure: TRANSESOPHAGEAL ECHOCARDIOGRAM (TEE) WITH PROPOFOL;  Surgeon: Arnoldo Lenis, MD;  Location: [facility];  Service: Endoscopy;  Laterality: N/A;  . TEE WITHOUT CARDIOVERSION N/A 10/07/2014   Procedure: TRANSESOPHAGEAL ECHOCARDIOGRAM (TEE);  Surgeon: Rexene Alberts, MD;  Location: [place];  Service: Open Heart Surgery;  Laterality: N/A;    Family History  Problem Relation Age of Onset  . Hypertension Mother   . Hypertension Father   . Heart disease Father     before age 59  . Other Father     varicose veins  . Colon cancer Neg Hx   . Celiac disease Neg Hx   . Inflammatory bowel disease Neg Hx     Social History   Social History  . Marital status: Married    Spouse name: N/A  . Number of children: 0  . Years of education: some coll   Occupational History  . call center [employer]   Social History Main Topics  . Smoking status: Current Every Day Smoker    Packs/day: 0.75    Types: Cigarettes    Last attempt to quit: 09/23/2014  . Smokeless  tobacco: Never Used  . Alcohol use 2.4 oz/week    4 Glasses of wine per week     Comment: 4 drinks per week  . Drug use: No  . Sexual activity: Not on file   Other Topics Concern  . Not on file   Social History Narrative   Patient is single and lives at home with her partner. Patient works at [employer] center. Some college education. Patient drinks coffee and tea.      ROS:  General: Negative for anorexia, weight loss, fever, chills, fatigue, weakness. Eyes: Negative for vision changes.  ENT: Negative for hoarseness, difficulty swallowing , nasal congestion. CV: Negative for chest pain, angina, palpitations, dyspnea on exertion,  peripheral edema.  Respiratory: Negative for dyspnea at rest, dyspnea on exertion, cough, sputum, wheezing.  GI: See history of present illness. GU:  Negative for dysuria, hematuria, urinary incontinence, urinary frequency, nocturnal urination.  MS: Negative for joint pain, low back pain.  Derm: Negative for rash or itching.  Neuro: Negative for weakness, abnormal sensation, seizure, frequent headaches, memory loss, confusion.  Psych: Negative for anxiety, depression, suicidal ideation, hallucinations.  Endo: Negative for unusual weight change.  Heme: Negative for bruising or bleeding. Allergy: Negative for rash or hives.    Physical Examination:  BP 111/71   Pulse 60   Temp 98.4 F (36.9 C) (Oral)   Ht 5\' 6"  (1.676 m)   Wt 226 lb 6.4 oz (102.7 kg)   BMI 36.54 kg/m    General: Well-nourished, well-developed in no acute distress.  Head: Normocephalic, atraumatic.   Eyes: Conjunctiva pink, no icterus. Mouth: Oropharyngeal mucosa moist and pink , no lesions erythema or exudate. Neck: Supple without thyromegaly, masses, or lymphadenopathy.  Lungs: Clear to auscultation bilaterally.  Heart: Regular rate and rhythm, no murmurs rubs or gallops.  Abdomen: Bowel sounds are normal, epig and left sided abd tenderness, nondistended, no hepatosplenomegaly  or masses, no abdominal bruits or    hernia , no rebound or guarding.   Rectal: not performed Extremities: No lower extremity edema. No clubbing or deformities.  Neuro: Alert and oriented x 4 , grossly normal neurologically.  Skin: Warm and dry, no rash or jaundice.   Psych: Alert and cooperative, normal mood and affect. Imaging Studies: No results found.  Impression/Plan:  61 y/o female with heartburn, nausea, epigastric pain in setting of coming off PPI. 50% improved with Aciphex. She has h/o ERE in 2011. She has some solid food dysphagia with prior h/o Schatzki's ring.  She also complains of 2 year h/o diarrhea since coming off Aciphex. Improved now back on Aciphex but not resolved. No solid stools but frequency improved. Remote colonoscopy. Will check labs including celiac. Plan for EGD/ED/colonoscopy with deep sedation in OR in near future.  I have discussed the risks, alternatives, benefits with regards to but not limited to the risk of reaction to medication, bleeding, infection, perforation and the patient is agreeable to proceed. Written consent to be obtained.

## 2016-08-10 NOTE — Patient Instructions (Signed)
1. Please have your blood work done. We will contact you with results. 2. Colonoscopy and upper endoscopy as scheduled. Please see separate instructions.

## 2016-08-11 ENCOUNTER — Telehealth: Payer: Self-pay

## 2016-08-11 LAB — COMPREHENSIVE METABOLIC PANEL
ALBUMIN: 4.1 g/dL (ref 3.6–5.1)
ALK PHOS: 54 U/L (ref 33–130)
ALT: 15 U/L (ref 6–29)
AST: 15 U/L (ref 10–35)
BUN: 17 mg/dL (ref 7–25)
CALCIUM: 9.5 mg/dL (ref 8.6–10.4)
CO2: 26 mmol/L (ref 20–31)
Chloride: 104 mmol/L (ref 98–110)
Creat: 1.07 mg/dL — ABNORMAL HIGH (ref 0.50–1.05)
GLUCOSE: 94 mg/dL (ref 65–99)
POTASSIUM: 4.1 mmol/L (ref 3.5–5.3)
Sodium: 140 mmol/L (ref 135–146)
Total Bilirubin: 0.3 mg/dL (ref 0.2–1.2)
Total Protein: 6.7 g/dL (ref 6.1–8.1)

## 2016-08-11 LAB — TSH: TSH: 1.87 m[IU]/L

## 2016-08-11 LAB — TISSUE TRANSGLUTAMINASE, IGA: Tissue Transglutaminase Ab, IgA: 1 U/mL (ref <4)

## 2016-08-11 LAB — IGA: IgA: 232 mg/dL (ref 81–463)

## 2016-08-11 NOTE — Telephone Encounter (Signed)
Submitted PA info for TCS/EGD/+/-ED via [facility] website. Notification/prior authorization number could not be assigned at this time.

## 2016-08-11 NOTE — Telephone Encounter (Signed)
Tried to call pt to inform of pre-op appt 09/22/16 at 3:00pm. LMOVM and informed. Letter also mailed.

## 2016-08-14 NOTE — Progress Notes (Signed)
Stable creatinine. Other labs unremarkable including Hgb, celiac, thyroid.  Procedures as planned.

## 2016-08-14 NOTE — Progress Notes (Signed)
cc'ed to pcp °

## 2016-08-21 NOTE — Telephone Encounter (Signed)
Service Ref# [id number].

## 2016-09-07 ENCOUNTER — Other Ambulatory Visit: Payer: Self-pay

## 2016-09-07 ENCOUNTER — Telehealth: Payer: Self-pay

## 2016-09-07 MED ORDER — PEG 3350-KCL-NA BICARB-NACL 420 G PO SOLR: 4000.0000 mL | ORAL | 0 refills | Status: DC

## 2016-09-07 NOTE — Telephone Encounter (Signed)
Pt called office. Suprep was going to cost $90 and she request Tri-Lyte prep. Rx for Tri-Lyte sent to pharmacy and new instructions mailed to pt. TCS is 09/27/16.

## 2016-09-19 ENCOUNTER — Telehealth: Payer: Self-pay | Admitting: General Practice

## 2016-09-19 NOTE — Telephone Encounter (Signed)
I spoke with the patient and she stated she will call [facility] and appeal their decision of denying her claim.

## 2016-09-19 NOTE — Telephone Encounter (Signed)
Pt said she was returning a call from this morning. I told her that was from our office manager, but she was on the other line and offered to transfer the call to VM. She said no and she would call back.

## 2016-09-19 NOTE — Patient Instructions (Signed)
Michelle Patton  09/19/2016     @PREFPERIOPPHARMACY @   Your procedure is scheduled on 09/27/2016.  Report to [facility] at 11:30 A.M.  Call this number if you have problems the morning of surgery:  [phone]   Remember:  Do not eat food or drink liquids after midnight.  Take these medicines the morning of surgery with A SIP OF WATER Klonopin, Estrace, Provera, Metoprolol, Aciphex   Do not wear jewelry, make-up or nail polish.  Do not wear lotions, powders, or perfumes, or deoderant.  Do not shave 48 hours prior to surgery.  Men may shave face and neck.  Do not bring valuables to the hospital.  [facility] is not responsible for any belongings or valuables.  Contacts, dentures or bridgework may not be worn into surgery.  Leave your suitcase in the car.  After surgery it may be brought to your room.  For patients admitted to the hospital, discharge time will be determined by your treatment team.  Patients discharged the day of surgery will not be allowed to drive home.    Please read over the following fact sheets that you were given. Anesthesia Post-op Instructions     PATIENT INSTRUCTIONS POST-ANESTHESIA  IMMEDIATELY FOLLOWING SURGERY:  Do not drive or operate machinery for the first twenty four hours after surgery.  Do not make any important decisions for twenty four hours after surgery or while taking narcotic pain medications or sedatives.  If you develop intractable nausea and vomiting or a severe headache please notify your doctor immediately.  FOLLOW-UP:  Please make an appointment with your surgeon as instructed. You do not need to follow up with anesthesia unless specifically instructed to do so.  WOUND CARE INSTRUCTIONS (if applicable):  Keep a dry clean dressing on the anesthesia/puncture wound site if there is drainage.  Once the wound has quit draining you may leave it open to air.  Generally you should leave the bandage intact for twenty four hours unless  there is drainage.  If the epidural site drains for more than 36-48 hours please call the anesthesia department.  QUESTIONS?:  Please feel free to call your physician or the hospital operator if you have any questions, and they will be happy to assist you.      Esophagogastroduodenoscopy Esophagogastroduodenoscopy (EGD) is a procedure to examine the lining of the esophagus, stomach, and first part of the small intestine (duodenum). This procedure is done to check for problems such as inflammation, bleeding, ulcers, or growths. During this procedure, a long, flexible, lighted tube with a camera attached (endoscope) is inserted down the throat. Tell a health care provider about:  Any allergies you have.  All medicines you are taking, including vitamins, herbs, eye drops, creams, and over-the-counter medicines.  Any problems you or family members have had with anesthetic medicines.  Any blood disorders you have.  Any surgeries you have had.  Any medical conditions you have.  Whether you are pregnant or may be pregnant. What are the risks? Generally, this is a safe procedure. However, problems may occur, including:  Infection.  Bleeding.  A tear (perforation) in the esophagus, stomach, or duodenum.  Trouble breathing.  Excessive sweating.  Spasms of the larynx.  A slowed heartbeat.  Low blood pressure. What happens before the procedure?  Follow instructions from your health care provider about eating or drinking restrictions.  Ask your health care provider about:  Changing or stopping your regular medicines. This is especially important if you  are taking diabetes medicines or blood thinners.  Taking medicines such as aspirin and ibuprofen. These medicines can thin your blood. Do not take these medicines before your procedure if your health care provider instructs you not to.  Plan to have someone take you home after the procedure.  If you wear dentures, be ready to  remove them before the procedure. What happens during the procedure?  To reduce your risk of infection, your health care team will wash or sanitize their hands.  An IV tube will be put in a vein in your hand or arm. You will get medicines and fluids through this tube.  You will be given one or more of the following:  A medicine to help you relax (sedative).  A medicine to numb the area (local anesthetic). This medicine may be sprayed into your throat. It will make you feel more comfortable and keep you from gagging or coughing during the procedure.  A medicine for pain.  A mouth guard may be placed in your mouth to protect your teeth and to keep you from biting on the endoscope.  You will be asked to lie on your left side.  The endoscope will be lowered down your throat into your esophagus, stomach, and duodenum.  Air will be put into the endoscope. This will help your health care provider see better.  The lining of your esophagus, stomach, and duodenum will be examined.  Your health care provider may:  Take a tissue sample so it can be looked at in a lab (biopsy).  Remove growths.  Remove objects (foreign bodies) that are stuck.  Treat any bleeding with medicines or other devices that stop tissue from bleeding.  Widen (dilate) or stretch narrowed areas of your esophagus and stomach.  The endoscope will be taken out. The procedure may vary among health care providers and hospitals. What happens after the procedure?  Your blood pressure, heart rate, breathing rate, and blood oxygen level will be monitored often until the medicines you were given have worn off.  Do not eat or drink anything until the numbing medicine has worn off and your gag reflex has returned. This information is not intended to replace advice given to you by your health care provider. Make sure you discuss any questions you have with your health care provider. Document Released: 08/11/2004 Document  Revised: 09/16/2015 Document Reviewed: 03/04/2015 Elsevier Interactive Patient Education  2017 [employer] Esophageal Dilatation Esophageal dilatation is a procedure to open a blocked or narrowed part of the esophagus. The esophagus is the long tube in your throat that carries food and liquid from your mouth to your stomach. The procedure is also called esophageal dilation. You may need this procedure if you have a buildup of scar tissue in your esophagus that makes it difficult, painful, or even impossible to swallow. This can be caused by gastroesophageal reflux disease (GERD). In rare cases, people need this procedure because they have cancer of the esophagus or a problem with the way food moves through the esophagus. Sometimes you may need to have another dilatation to enlarge the opening of the esophagus gradually. Tell a health care provider about:  Any allergies you have.  All medicines you are taking, including vitamins, herbs, eye drops, creams, and over-the-counter medicines.  Any problems you or family members have had with anesthetic medicines.  Any blood disorders you have.  Any surgeries you have had.  Any medical conditions you have.  Any antibiotic medicines you are required  to take before dental procedures. What are the risks? Generally, this is a safe procedure. However, problems can occur and include:  Bleeding from a tear in the lining of the esophagus.  A hole (perforation) in the esophagus. What happens before the procedure?  Do not eat or drink anything after midnight on the night before the procedure or as directed by your health care provider.  Ask your health care provider about changing or stopping your regular medicines. This is especially important if you are taking diabetes medicines or blood thinners.  Plan to have someone take you home after the procedure. What happens during the procedure?  You will be given a medicine that makes you relaxed and  sleepy (sedative).  A medicine may be sprayed or gargled to numb the back of the throat.  Your health care provider can use various instruments to do an esophageal dilatation. During the procedure, the instrument used will be placed in your mouth and passed down into your esophagus. Options include:  Simple dilators. This instrument is carefully placed in the esophagus to stretch it.  Guided wire bougies. In this method, a flexible tube (endoscope) is used to insert a wire into the esophagus. The dilator is passed over this wire to enlarge the esophagus. Then the wire is removed.  Balloon dilators. An endoscope with a small balloon at the end is passed down into the esophagus. Inflating the balloon gently stretches the esophagus and opens it up. What happens after the procedure?  Your blood pressure, heart rate, breathing rate, and blood oxygen level will be monitored often until the medicines you were given have worn off.  Your throat may feel slightly sore and will probably still feel numb. This will improve slowly over time.  You will not be allowed to eat or drink until the throat numbness has resolved.  If this is a same-day procedure, you may be allowed to go home once you have been able to drink, urinate, and sit on the edge of the bed without nausea or dizziness.  If this is a same-day procedure, you should have a friend or family member with you for the next 24 hours after the procedure. This information is not intended to replace advice given to you by your health care provider. Make sure you discuss any questions you have with your health care provider. Document Released: 06/01/2005 Document Revised: 09/16/2015 Document Reviewed: 08/20/2013 Elsevier Interactive Patient Education  2017 [place]. Colonoscopy, Adult A colonoscopy is an exam to look at the entire large intestine. During the exam, a lubricated, bendable tube is inserted into the anus and then passed into the  rectum, colon, and other parts of the large intestine. A colonoscopy is often done as a part of normal colorectal screening or in response to certain symptoms, such as anemia, persistent diarrhea, abdominal pain, and blood in the stool. The exam can help screen for and diagnose medical problems, including:  Tumors.  Polyps.  Inflammation.  Areas of bleeding. Tell a health care provider about:  Any allergies you have.  All medicines you are taking, including vitamins, herbs, eye drops, creams, and over-the-counter medicines.  Any problems you or family members have had with anesthetic medicines.  Any blood disorders you have.  Any surgeries you have had.  Any medical conditions you have.  Any problems you have had passing stool. What are the risks? Generally, this is a safe procedure. However, problems may occur, including:  Bleeding.  A tear in the  intestine.  A reaction to medicines given during the exam.  Infection (rare). What happens before the procedure? Eating and drinking restrictions  Follow instructions from your health care provider about eating and drinking, which may include:  A few days before the procedure - follow a low-fiber diet. Avoid nuts, seeds, dried fruit, raw fruits, and vegetables.  1-3 days before the procedure - follow a clear liquid diet. Drink only clear liquids, such as clear broth or bouillon, black coffee or tea, clear juice, clear soft drinks or sports drinks, gelatin dessert, and popsicles. Avoid any liquids that contain red or purple dye.  On the day of the procedure - do not eat or drink anything during the 2 hours before the procedure, or within the time period that your health care provider recommends. Bowel prep  If you were prescribed an oral bowel prep to clean out your colon:  Take it as told by your health care provider. Starting the day before your procedure, you will need to drink a large amount of medicated liquid. The liquid  will cause you to have multiple loose stools until your stool is almost clear or light green.  If your skin or anus gets irritated from diarrhea, you may use these to relieve the irritation:  Medicated wipes, such as adult wet wipes with aloe and vitamin E.  A skin soothing-product like petroleum jelly.  If you vomit while drinking the bowel prep, take a break for up to 60 minutes and then begin the bowel prep again. If vomiting continues and you cannot take the bowel prep without vomiting, call your health care provider. General instructions   Ask your health care provider about changing or stopping your regular medicines. This is especially important if you are taking diabetes medicines or blood thinners.  Plan to have someone take you home from the hospital or clinic. What happens during the procedure?  An IV tube may be inserted into one of your veins.  You will be given medicine to help you relax (sedative).  To reduce your risk of infection:  Your health care team will wash or sanitize their hands.  Your anal area will be washed with soap.  You will be asked to lie on your side with your knees bent.  Your health care provider will lubricate a long, thin, flexible tube. The tube will have a camera and a light on the end.  The tube will be inserted into your anus.  The tube will be gently eased through your rectum and colon.  Air will be delivered into your colon to keep it open. You may feel some pressure or cramping.  The camera will be used to take images during the procedure.  A small tissue sample may be removed from your body to be examined under a microscope (biopsy). If any potential problems are found, the tissue will be sent to a lab for testing.  If small polyps are found, your health care provider may remove them and have them checked for cancer cells.  The tube that was inserted into your anus will be slowly removed. The procedure may vary among health care  providers and hospitals. What happens after the procedure?  Your blood pressure, heart rate, breathing rate, and blood oxygen level will be monitored until the medicines you were given have worn off.  Do not drive for 24 hours after the exam.  You may have a small amount of blood in your stool.  You may pass gas  and have mild abdominal cramping or bloating due to the air that was used to inflate your colon during the exam.  It is up to you to get the results of your procedure. Ask your health care provider, or the department performing the procedure, when your results will be ready. This information is not intended to replace advice given to you by your health care provider. Make sure you discuss any questions you have with your health care provider. Document Released: 04/07/2000 Document Revised: 02/09/2016 Document Reviewed: 06/22/2015 Elsevier Interactive Patient Education  2017 [employer].

## 2016-09-19 NOTE — Telephone Encounter (Signed)
I called the patient to discuss her billing question and was told by her husband that he will have her call me back.   See e-mail for response from our billing department.

## 2016-09-22 ENCOUNTER — Encounter (HOSPITAL_COMMUNITY): Payer: Self-pay

## 2016-09-22 ENCOUNTER — Encounter (HOSPITAL_COMMUNITY)
Admission: RE | Admit: 2016-09-22 | Discharge: 2016-09-22 | Disposition: A | Discharge status: Home / Self Care | Payer: 59 | Source: Ambulatory Visit | Attending: Internal Medicine | Admitting: Internal Medicine

## 2016-09-22 ENCOUNTER — Telehealth: Payer: Self-pay | Admitting: Cardiology

## 2016-09-22 ENCOUNTER — Other Ambulatory Visit: Payer: Self-pay

## 2016-09-22 ENCOUNTER — Telehealth: Payer: Self-pay

## 2016-09-22 DIAGNOSIS — R197 Diarrhea, unspecified: Secondary | ICD-10-CM | POA: Diagnosis not present

## 2016-09-22 DIAGNOSIS — R1013 Epigastric pain: Secondary | ICD-10-CM | POA: Diagnosis not present

## 2016-09-22 DIAGNOSIS — R109 Unspecified abdominal pain: Secondary | ICD-10-CM | POA: Diagnosis not present

## 2016-09-22 DIAGNOSIS — Z01818 Encounter for other preprocedural examination: Secondary | ICD-10-CM | POA: Insufficient documentation

## 2016-09-22 DIAGNOSIS — Z01810 Encounter for preprocedural cardiovascular examination: Secondary | ICD-10-CM | POA: Insufficient documentation

## 2016-09-22 DIAGNOSIS — K219 Gastro-esophageal reflux disease without esophagitis: Secondary | ICD-10-CM | POA: Insufficient documentation

## 2016-09-22 DIAGNOSIS — R9431 Abnormal electrocardiogram [ECG] [EKG]: Secondary | ICD-10-CM | POA: Insufficient documentation

## 2016-09-22 DIAGNOSIS — R1310 Dysphagia, unspecified: Secondary | ICD-10-CM | POA: Diagnosis not present

## 2016-09-22 HISTORY — DX: Anxiety disorder, unspecified: F41.9

## 2016-09-22 LAB — BASIC METABOLIC PANEL
ANION GAP: 7 (ref 5–15)
BUN: 14 mg/dL (ref 6–20)
CALCIUM: 8.9 mg/dL (ref 8.9–10.3)
CO2: 26 mmol/L (ref 22–32)
CREATININE: 0.89 mg/dL (ref 0.44–1.00)
Chloride: 107 mmol/L (ref 101–111)
GLUCOSE: 103 mg/dL — AB (ref 65–99)
Potassium: 3.7 mmol/L (ref 3.5–5.1)
Sodium: 140 mmol/L (ref 135–145)

## 2016-09-22 LAB — CBC WITH DIFFERENTIAL/PLATELET
BASOS ABS: 0.1 10*3/uL (ref 0.0–0.1)
Basophils Relative: 1 %
EOS PCT: 3 %
Eosinophils Absolute: 0.2 10*3/uL (ref 0.0–0.7)
HCT: 40.2 % (ref 36.0–46.0)
Hemoglobin: 13.7 g/dL (ref 12.0–15.0)
LYMPHS PCT: 31 %
Lymphs Abs: 2.2 10*3/uL (ref 0.7–4.0)
MCH: 30.8 pg (ref 26.0–34.0)
MCHC: 34.1 g/dL (ref 30.0–36.0)
MCV: 90.3 fL (ref 78.0–100.0)
Monocytes Absolute: 0.7 10*3/uL (ref 0.1–1.0)
Monocytes Relative: 9 %
Neutro Abs: 4.1 10*3/uL (ref 1.7–7.7)
Neutrophils Relative %: 56 %
PLATELETS: 245 10*3/uL (ref 150–400)
RBC: 4.45 MIL/uL (ref 3.87–5.11)
RDW: 12.5 % (ref 11.5–15.5)
WBC: 7.2 10*3/uL (ref 4.0–10.5)

## 2016-09-22 NOTE — Telephone Encounter (Signed)
Patient is having EGD/TCS and possible esophageal dilation on 09/27/16. Does she need to premedicate w/ anitbiotic prior to? Please call Kim in Day [facility]. / tg

## 2016-09-22 NOTE — Telephone Encounter (Signed)
No abx needed   [name] MD

## 2016-09-22 NOTE — Telephone Encounter (Signed)
To Dr. Branch  

## 2016-09-22 NOTE — Telephone Encounter (Signed)
Called pt to verify she is aware of her pre-op appt today at 3:00pm. She was aware.

## 2016-09-25 NOTE — Telephone Encounter (Signed)
PT NOTIFIED  

## 2016-09-27 ENCOUNTER — Ambulatory Visit (HOSPITAL_COMMUNITY): Payer: 59 | Admitting: Anesthesiology

## 2016-09-27 ENCOUNTER — Encounter (HOSPITAL_COMMUNITY): Payer: Self-pay | Admitting: *Deleted

## 2016-09-27 ENCOUNTER — Ambulatory Visit (HOSPITAL_COMMUNITY)
Admission: RE | Admit: 2016-09-27 | Discharge: 2016-09-27 | Disposition: A | Discharge status: Home / Self Care | Payer: 59 | Source: Ambulatory Visit | Attending: Internal Medicine | Admitting: Internal Medicine

## 2016-09-27 ENCOUNTER — Encounter (HOSPITAL_COMMUNITY)
Admission: RE | Disposition: A | Discharge status: Home / Self Care | Payer: Self-pay | Source: Ambulatory Visit | Attending: Internal Medicine

## 2016-09-27 DIAGNOSIS — K210 Gastro-esophageal reflux disease with esophagitis: Secondary | ICD-10-CM | POA: Diagnosis not present

## 2016-09-27 DIAGNOSIS — K640 First degree hemorrhoids: Secondary | ICD-10-CM | POA: Diagnosis not present

## 2016-09-27 DIAGNOSIS — R197 Diarrhea, unspecified: Secondary | ICD-10-CM

## 2016-09-27 DIAGNOSIS — Z86718 Personal history of other venous thrombosis and embolism: Secondary | ICD-10-CM | POA: Insufficient documentation

## 2016-09-27 DIAGNOSIS — G4700 Insomnia, unspecified: Secondary | ICD-10-CM | POA: Diagnosis not present

## 2016-09-27 DIAGNOSIS — K621 Rectal polyp: Secondary | ICD-10-CM | POA: Insufficient documentation

## 2016-09-27 DIAGNOSIS — Z981 Arthrodesis status: Secondary | ICD-10-CM | POA: Insufficient documentation

## 2016-09-27 DIAGNOSIS — R109 Unspecified abdominal pain: Secondary | ICD-10-CM

## 2016-09-27 DIAGNOSIS — I739 Peripheral vascular disease, unspecified: Secondary | ICD-10-CM | POA: Insufficient documentation

## 2016-09-27 DIAGNOSIS — I351 Nonrheumatic aortic (valve) insufficiency: Secondary | ICD-10-CM | POA: Insufficient documentation

## 2016-09-27 DIAGNOSIS — R1314 Dysphagia, pharyngoesophageal phase: Secondary | ICD-10-CM | POA: Insufficient documentation

## 2016-09-27 DIAGNOSIS — K209 Esophagitis, unspecified: Secondary | ICD-10-CM | POA: Diagnosis not present

## 2016-09-27 DIAGNOSIS — F419 Anxiety disorder, unspecified: Secondary | ICD-10-CM | POA: Insufficient documentation

## 2016-09-27 DIAGNOSIS — R1013 Epigastric pain: Secondary | ICD-10-CM

## 2016-09-27 DIAGNOSIS — Z880 Allergy status to penicillin: Secondary | ICD-10-CM | POA: Diagnosis not present

## 2016-09-27 DIAGNOSIS — K219 Gastro-esophageal reflux disease without esophagitis: Secondary | ICD-10-CM

## 2016-09-27 DIAGNOSIS — D124 Benign neoplasm of descending colon: Secondary | ICD-10-CM | POA: Insufficient documentation

## 2016-09-27 DIAGNOSIS — R1310 Dysphagia, unspecified: Secondary | ICD-10-CM | POA: Diagnosis not present

## 2016-09-27 DIAGNOSIS — Z7982 Long term (current) use of aspirin: Secondary | ICD-10-CM | POA: Diagnosis not present

## 2016-09-27 DIAGNOSIS — E785 Hyperlipidemia, unspecified: Secondary | ICD-10-CM | POA: Insufficient documentation

## 2016-09-27 DIAGNOSIS — D128 Benign neoplasm of rectum: Secondary | ICD-10-CM | POA: Diagnosis not present

## 2016-09-27 DIAGNOSIS — D125 Benign neoplasm of sigmoid colon: Secondary | ICD-10-CM | POA: Insufficient documentation

## 2016-09-27 DIAGNOSIS — Z79899 Other long term (current) drug therapy: Secondary | ICD-10-CM | POA: Diagnosis not present

## 2016-09-27 DIAGNOSIS — K449 Diaphragmatic hernia without obstruction or gangrene: Secondary | ICD-10-CM | POA: Insufficient documentation

## 2016-09-27 DIAGNOSIS — Z882 Allergy status to sulfonamides status: Secondary | ICD-10-CM | POA: Insufficient documentation

## 2016-09-27 DIAGNOSIS — G4730 Sleep apnea, unspecified: Secondary | ICD-10-CM | POA: Insufficient documentation

## 2016-09-27 DIAGNOSIS — Z8249 Family history of ischemic heart disease and other diseases of the circulatory system: Secondary | ICD-10-CM | POA: Insufficient documentation

## 2016-09-27 DIAGNOSIS — K529 Noninfective gastroenteritis and colitis, unspecified: Secondary | ICD-10-CM | POA: Diagnosis present

## 2016-09-27 DIAGNOSIS — K222 Esophageal obstruction: Secondary | ICD-10-CM | POA: Diagnosis not present

## 2016-09-27 DIAGNOSIS — K6389 Other specified diseases of intestine: Secondary | ICD-10-CM

## 2016-09-27 DIAGNOSIS — F17210 Nicotine dependence, cigarettes, uncomplicated: Secondary | ICD-10-CM | POA: Diagnosis not present

## 2016-09-27 DIAGNOSIS — I10 Essential (primary) hypertension: Secondary | ICD-10-CM | POA: Insufficient documentation

## 2016-09-27 DIAGNOSIS — Z952 Presence of prosthetic heart valve: Secondary | ICD-10-CM | POA: Insufficient documentation

## 2016-09-27 DIAGNOSIS — K5730 Diverticulosis of large intestine without perforation or abscess without bleeding: Secondary | ICD-10-CM | POA: Diagnosis not present

## 2016-09-27 HISTORY — PX: COLONOSCOPY WITH PROPOFOL: SHX5780

## 2016-09-27 HISTORY — PX: MALONEY DILATION: SHX5535

## 2016-09-27 HISTORY — PX: BIOPSY: SHX5522

## 2016-09-27 HISTORY — PX: ESOPHAGOGASTRODUODENOSCOPY (EGD) WITH PROPOFOL: SHX5813

## 2016-09-27 HISTORY — PX: POLYPECTOMY: SHX5525

## 2016-09-27 SURGERY — COLONOSCOPY WITH PROPOFOL
Anesthesia: Monitor Anesthesia Care

## 2016-09-27 MED ORDER — PROPOFOL 10 MG/ML IV BOLUS
INTRAVENOUS | Status: AC
  Filled 2016-09-27: qty 40

## 2016-09-27 MED ORDER — PROPOFOL 10 MG/ML IV BOLUS
INTRAVENOUS | Status: DC | PRN
Start: 2016-09-27 — End: 2016-09-27
  Administered 2016-09-27 (×3): 20 mg via INTRAVENOUS

## 2016-09-27 MED ORDER — CHLORHEXIDINE GLUCONATE CLOTH 2 % EX PADS: 6.0000 | MEDICATED_PAD | Freq: Once | CUTANEOUS | Status: DC

## 2016-09-27 MED ORDER — LACTATED RINGERS IV SOLN
INTRAVENOUS | Status: DC
  Administered 2016-09-27: 13:00:00 via INTRAVENOUS

## 2016-09-27 MED ORDER — LIDOCAINE HCL (PF) 1 % IJ SOLN
INTRAMUSCULAR | Status: AC
  Filled 2016-09-27: qty 5

## 2016-09-27 MED ORDER — FENTANYL CITRATE (PF) 100 MCG/2ML IJ SOLN
25.0000 ug | Freq: Once | INTRAMUSCULAR | Status: AC
  Administered 2016-09-27: 25 ug via INTRAVENOUS

## 2016-09-27 MED ORDER — LIDOCAINE VISCOUS 2 % MT SOLN
OROMUCOSAL | Status: AC
  Filled 2016-09-27: qty 15

## 2016-09-27 MED ORDER — MIDAZOLAM HCL 2 MG/2ML IJ SOLN
INTRAMUSCULAR | Status: AC
Start: 2016-09-27 — End: 2016-09-27
  Filled 2016-09-27: qty 2

## 2016-09-27 MED ORDER — ONDANSETRON HCL 4 MG/2ML IJ SOLN
INTRAMUSCULAR | Status: AC
  Filled 2016-09-27: qty 2

## 2016-09-27 MED ORDER — PROPOFOL 500 MG/50ML IV EMUL
INTRAVENOUS | Status: DC | PRN
  Administered 2016-09-27: 175 ug/kg/min via INTRAVENOUS
  Administered 2016-09-27: 125 ug/kg/min via INTRAVENOUS
  Administered 2016-09-27: 175 ug/kg/min via INTRAVENOUS

## 2016-09-27 MED ORDER — FENTANYL CITRATE (PF) 100 MCG/2ML IJ SOLN
INTRAMUSCULAR | Status: AC
  Filled 2016-09-27: qty 2

## 2016-09-27 MED ORDER — MIDAZOLAM HCL 2 MG/2ML IJ SOLN
1.0000 mg | INTRAMUSCULAR | Status: AC
  Administered 2016-09-27: 2 mg via INTRAVENOUS

## 2016-09-27 NOTE — Transfer of Care (Signed)
Immediate Anesthesia Transfer of Care Note  Patient: Michelle Patton  Procedure(s) Performed: Procedure(s) with comments: COLONOSCOPY WITH PROPOFOL (N/A) - 1:00pm ESOPHAGOGASTRODUODENOSCOPY (EGD) WITH PROPOFOL (N/A) MALONEY DILATION (N/A) BIOPSY - colon POLYPECTOMY - colon  Patient Location: PACU  Anesthesia Type:MAC  Level of Consciousness: awake and patient cooperative  Airway & Oxygen Therapy: Patient Spontanous Breathing and Patient connected to nasal cannula oxygen  Post-op Assessment: Report given to RN and Post -op Vital signs reviewed and stable  Post vital signs: Reviewed and stable  Last Vitals:  Vitals:   09/27/16 1129  Temp: 36.6 C    Last Pain:  Vitals:   09/27/16 1129  TempSrc: Oral  PainSc: 3       Patients Stated Pain Goal: 9 (04/25/70 5366)  Complications: No apparent anesthesia complications

## 2016-09-27 NOTE — Op Note (Signed)
[facility] Patient Name: Michelle Patton Procedure Date: 09/27/2016 1:15 PM MRN: [id number] Date of Birth: 1955-12-08 Attending MD: Norvel Richards , MD CSN: [id number] Age: 61 Admit Type: Outpatient Procedure:                Ileo-colonoscopy Indications:              Chronic diarrhea Providers:                Norvel Richards, MD Referring MD:              Medicines:                Propofol per Anesthesia Complications:            No immediate complications. Estimated Blood Loss:     Estimated blood loss was minimal. Procedure:                Pre-Anesthesia Assessment:                           - Prior to the procedure, a History and Physical                            was performed, and patient medications and                            allergies were reviewed. The patient's tolerance of                            previous anesthesia was also reviewed. The risks                            and benefits of the procedure and the sedation                            options and risks were discussed with the patient.                            All questions were answered, and informed consent                            was obtained. Prior Anticoagulants: The patient has                            taken no previous anticoagulant or antiplatelet                            agents. ASA Grade Assessment: II - A patient with                            mild systemic disease. After reviewing the risks                            and benefits, the patient was deemed in  satisfactory condition to undergo the procedure.                           After obtaining informed consent, the colonoscope                            was passed under direct vision. Throughout the                            procedure, the patient's blood pressure, pulse, and                            oxygen saturations were monitored continuously. The                            EC-[id number]  ([id number]) scope was introduced through                            the and advanced to the 5 cm into the ileum. The                            colonoscopy was performed without difficulty. The                            patient tolerated the procedure well. The quality                            of the bowel preparation was adequate. The terminal                            ileum, ileocecal valve, appendiceal orifice, and                            rectum were photographed. The entire colon was well                            visualized. Scope In: 1:20:41 PM Scope Out: 1:41:09 PM Scope Withdrawal Time: 0 hours 15 minutes 6 seconds  Total Procedure Duration: 0 hours 20 minutes 28 seconds  Findings:      The perianal and digital rectal examinations were normal.      Multiple diverticula were found in the sigmoid colon and descending       colon.      Multiple semi-pedunculated polyps were found in the rectum, sigmoid       colon and descending colon. The polyps were 4 to 7 mm in size. These       polyps were removed with a cold snare. Resection and retrieval were       complete. Estimated blood loss was minimal.      Internal hemorrhoids were found during retroflexion. The hemorrhoids       were Grade I (internal hemorrhoids that do not prolapse). anal papilla       present as well. Segmental biopsies the right and left colon taken for       histologic study.      The exam was  otherwise without abnormality on direct and retroflexion       views. Distal 5 cm of terminal ileal mucosa also appeared normal. Impression:               Segmental biopsies taken.                           - Diverticulosis in the sigmoid colon and in the                            descending colon.                           - Multiple 4 to 7 mm polyps in the rectum, in the                            sigmoid colon and in the descending colon, removed                            with a cold snare. Resected and  retrieved.                           - Internal hemorrhoids.                           - The examination was otherwise normal on direct                            and retroflexion views. Moderate Sedation:      Moderate (conscious) sedation was personally administered by an       anesthesia professional. The following parameters were monitored: oxygen       saturation, heart rate, blood pressure, respiratory rate, EKG, adequacy       of pulmonary ventilation, and response to care. Total physician       intraservice time was 43 minutes. Recommendation:           - Patient has a contact number available for                            emergencies. The signs and symptoms of potential                            delayed complications were discussed with the                            patient. Return to normal activities tomorrow.                            Written discharge instructions were provided to the                            patient.                           - Resume previous diet.                           -  Continue present medications.                           - Await pathology results.                           - Repeat colonoscopy date to be determined after                            pending pathology results are reviewed for                            surveillance based on pathology results.                           - Return to GI office after studies are complete.                            See EGD report. Procedure Code(s):        --- Professional ---                           [id number], Colonoscopy, flexible; with removal of                            tumor(s), polyp(s), or other lesion(s) by snare                            technique Diagnosis Code(s):        --- Professional ---                           K64.0, First degree hemorrhoids                           K62.1, Rectal polyp                           D12.5, Benign neoplasm of sigmoid colon                            D12.4, Benign neoplasm of descending colon                           K52.9, Noninfective gastroenteritis and colitis,                            unspecified                           K57.30, Diverticulosis of large intestine without                            perforation or abscess without bleeding CPT copyright 2016 American Medical [employer]. All rights reserved. The codes documented in this report are preliminary and upon coder review may  be revised to meet current compliance requirements. Cristopher Estimable. Gala Romney, MD Norvel Richards, MD  09/27/2016 1:59:45 PM This report has been signed electronically. Number of Addenda: 0

## 2016-09-27 NOTE — Discharge Instructions (Addendum)
°Colonoscopy °Discharge Instructions ° °Read the instructions outlined below and refer to this sheet in the next few weeks. These discharge instructions provide you with general information on caring for yourself after you leave the hospital. Your doctor may also give you specific instructions. While your treatment has been planned according to the most current medical practices available, unavoidable complications occasionally occur. If you have any problems or questions after discharge, call Dr. Rourk at [phone]. °ACTIVITY °· You may resume your regular activity, but move at a slower pace for the next 24 hours.  °· Take frequent rest periods for the next 24 hours.  °· Walking will help get rid of the air and reduce the bloated feeling in your belly (abdomen).  °· No driving for 24 hours (because of the medicine (anesthesia) used during the test).   °· Do not sign any important legal documents or operate any machinery for 24 hours (because of the anesthesia used during the test).  °NUTRITION °· Drink plenty of fluids.  °· You may resume your normal diet as instructed by your doctor.  °· Begin with a light meal and progress to your normal diet. Heavy or fried foods are harder to digest and may make you feel sick to your stomach (nauseated).  °· Avoid alcoholic beverages for 24 hours or as instructed.  °MEDICATIONS °· You may resume your normal medications unless your doctor tells you otherwise.  °WHAT YOU CAN EXPECT TODAY °· Some feelings of bloating in the abdomen.  °· Passage of more gas than usual.  °· Spotting of blood in your stool or on the toilet paper.  °IF YOU HAD POLYPS REMOVED DURING THE COLONOSCOPY: °· No aspirin products for 7 days or as instructed.  °· No alcohol for 7 days or as instructed.  °· Eat a soft diet for the next 24 hours.  °FINDING OUT THE RESULTS OF YOUR TEST °Not all test results are available during your visit. If your test results are not back during the visit, make an appointment  with your caregiver to find out the results. Do not assume everything is normal if you have not heard from your caregiver or the medical facility. It is important for you to follow up on all of your test results.  °SEEK IMMEDIATE MEDICAL ATTENTION IF: °· You have more than a spotting of blood in your stool.  °· Your belly is swollen (abdominal distention).  °· You are nauseated or vomiting.  °· You have a temperature over 101.  °· You have abdominal pain or discomfort that is severe or gets worse throughout the day.  °EGD °Discharge instructions °Please read the instructions outlined below and refer to this sheet in the next few weeks. These discharge instructions provide you with general information on caring for yourself after you leave the hospital. Your doctor may also give you specific instructions. While your treatment has been planned according to the most current medical practices available, unavoidable complications occasionally occur. If you have any problems or questions after discharge, please call your doctor. °ACTIVITY °· You may resume your regular activity but move at a slower pace for the next 24 hours.  °· Take frequent rest periods for the next 24 hours.  °· Walking will help expel (get rid of) the air and reduce the bloated feeling in your abdomen.  °· No driving for 24 hours (because of the anesthesia (medicine) used during the test).  °· You may shower.  °· Do not sign any important   legal documents or operate any machinery for 24 hours (because of the anesthesia used during the test).  NUTRITION  Drink plenty of fluids.   You may resume your normal diet.   Begin with a light meal and progress to your normal diet.   Avoid alcoholic beverages for 24 hours or as instructed by your caregiver.  MEDICATIONS  You may resume your normal medications unless your caregiver tells you otherwise.  WHAT YOU CAN EXPECT TODAY  You may experience abdominal discomfort such as a feeling of fullness  or gas pains.  FOLLOW-UP  Your doctor will discuss the results of your test with you.  SEEK IMMEDIATE MEDICAL ATTENTION IF ANY OF THE FOLLOWING OCCUR:  Excessive nausea (feeling sick to your stomach) and/or vomiting.   Severe abdominal pain and distention (swelling).   Trouble swallowing.   Temperature over 101 F (37.8 C).   Rectal bleeding or vomiting of blood.   GERD information provided  Colon polyp and diverticulosis information provided  Increase AcipHex to 20 mg twice daily for the next 3 months  Office visit with [place] in 3 months  Further recommendations to follow pending review of pathology report    Gastroesophageal Reflux Disease, Adult Normally, food travels down the esophagus and stays in the stomach to be digested. If a person has gastroesophageal reflux disease (GERD), food and stomach acid move back up into the esophagus. When this happens, the esophagus becomes sore and swollen (inflamed). Over time, GERD can make small holes (ulcers) in the lining of the esophagus. Follow these instructions at home: Diet  Follow a diet as told by your doctor. You may need to avoid foods and drinks such as: ? Coffee and tea (with or without caffeine). ? Drinks that contain alcohol. ? Energy drinks and sports drinks. ? Carbonated drinks or sodas. ? Chocolate and cocoa. ? Peppermint and mint flavorings. ? Garlic and onions. ? Horseradish. ? Spicy and acidic foods, such as peppers, chili powder, curry powder, vinegar, hot sauces, and BBQ sauce. ? Citrus fruit juices and citrus fruits, such as oranges, lemons, and limes. ? Tomato-based foods, such as red sauce, chili, salsa, and pizza with red sauce. ? Fried and fatty foods, such as donuts, french fries, potato chips, and high-fat dressings. ? High-fat meats, such as hot dogs, rib eye steak, sausage, ham, and bacon. ? High-fat dairy items, such as whole milk, butter, and cream cheese.  Eat small meals often. Avoid eating  large meals.  Avoid drinking large amounts of liquid with your meals.  Avoid eating meals during the 2-3 hours before bedtime.  Avoid lying down right after you eat.  Do not exercise right after you eat. General instructions  Pay attention to any changes in your symptoms.  Take over-the-counter and prescription medicines only as told by your doctor. Do not take aspirin, ibuprofen, or other NSAIDs unless your doctor says it is okay.  Do not use any tobacco products, including cigarettes, chewing tobacco, and e-cigarettes. If you need help quitting, ask your doctor.  Wear loose clothes. Do not wear anything tight around your waist.  Raise (elevate) the head of your bed about 6 inches (15 cm).  Try to lower your stress. If you need help doing this, ask your doctor.  If you are overweight, lose an amount of weight that is healthy for you. Ask your doctor about a safe weight loss goal.  Keep all follow-up visits as told by your doctor. This is important. Contact a doctor  if:  You have new symptoms.  You lose weight and you do not know why it is happening.  You have trouble swallowing, or it hurts to swallow.  You have wheezing or a cough that keeps happening.  Your symptoms do not get better with treatment.  You have a hoarse voice. Get help right away if:  You have pain in your arms, neck, jaw, teeth, or back.  You feel sweaty, dizzy, or light-headed.  You have chest pain or shortness of breath.  You throw up (vomit) and your throw up looks like blood or coffee grounds.  You pass out (faint).  Your poop (stool) is bloody or black.  You cannot swallow, drink, or eat. This information is not intended to replace advice given to you by your health care provider. Make sure you discuss any questions you have with your health care provider. Document Released: 09/27/2007 Document Revised: 09/16/2015 Document Reviewed: 08/05/2014 Elsevier Interactive Patient Education   [employer].    Diverticulosis Diverticulosis is a condition that develops when small pouches (diverticula) form in the wall of the large intestine (colon). The colon is where water is absorbed and stool is formed. The pouches form when the inside layer of the colon pushes through weak spots in the outer layers of the colon. You may have a few pouches or many of them. What are the causes? The cause of this condition is not known. What increases the risk? The following factors may make you more likely to develop this condition:  Being older than age 37. Your risk for this condition increases with age. Diverticulosis is rare among people younger than age 69. By age 23, many people have it.  Eating a low-fiber diet.  Having frequent constipation.  Being overweight.  Not getting enough exercise.  Smoking.  Taking over-the-counter pain medicines, like aspirin and ibuprofen.  Having a family history of diverticulosis.  What are the signs or symptoms? In most people, there are no symptoms of this condition. If you do have symptoms, they may include:  Bloating.  Cramps in the abdomen.  Constipation or diarrhea.  Pain in the lower left side of the abdomen.  How is this diagnosed? This condition is most often diagnosed during an exam for other colon problems. Because diverticulosis usually has no symptoms, it often cannot be diagnosed independently. This condition may be diagnosed by:  Using a flexible scope to examine the colon (colonoscopy).  Taking an X-ray of the colon after dye has been put into the colon (barium enema).  Doing a CT scan.  How is this treated? You may not need treatment for this condition if you have never developed an infection related to diverticulosis. If you have had an infection before, treatment may include:  Eating a high-fiber diet. This may include eating more fruits, vegetables, and grains.  Taking a fiber supplement.  Taking a live  bacteria supplement (probiotic).  Taking medicine to relax your colon.  Taking antibiotic medicines.  Follow these instructions at home:  Drink 6-8 glasses of water or more each day to prevent constipation.  Try not to strain when you have a bowel movement.  If you have had an infection before: ? Eat more fiber as directed by your health care provider or your diet and nutrition specialist (dietitian). ? Take a fiber supplement or probiotic, if your health care provider approves.  Take over-the-counter and prescription medicines only as told by your health care provider.  If you were prescribed  an antibiotic, take it as told by your health care provider. Do not stop taking the antibiotic even if you start to feel better.  Keep all follow-up visits as told by your health care provider. This is important. Contact a health care provider if:  You have pain in your abdomen.  You have bloating.  You have cramps.  You have not had a bowel movement in 3 days. Get help right away if:  Your pain gets worse.  Your bloating becomes very bad.  You have a fever or chills, and your symptoms suddenly get worse.  You vomit.  You have bowel movements that are bloody or black.  You have bleeding from your rectum. Summary  Diverticulosis is a condition that develops when small pouches (diverticula) form in the wall of the large intestine (colon).  You may have a few pouches or many of them.  This condition is most often diagnosed during an exam for other colon problems.  If you have had an infection related to diverticulosis, treatment may include increasing the fiber in your diet, taking supplements, or taking medicines. This information is not intended to replace advice given to you by your health care provider. Make sure you discuss any questions you have with your health care provider. Document Released: 01/06/2004 Document Revised: 02/28/2016 Document Reviewed:  02/28/2016 Elsevier Interactive Patient Education  2017 [place].    Colon Polyps Polyps are tissue growths inside the body. Polyps can grow in many places, including the large intestine (colon). A polyp may be a round bump or a mushroom-shaped growth. You could have one polyp or several. Most colon polyps are noncancerous (benign). However, some colon polyps can become cancerous over time. What are the causes? The exact cause of colon polyps is not known. What increases the risk? This condition is more likely to develop in people who:  Have a family history of colon cancer or colon polyps.  Are older than 110 or older than 45 if they are African American.  Have inflammatory bowel disease, such as ulcerative colitis or Crohn disease.  Are overweight.  Smoke cigarettes.  Do not get enough exercise.  Drink too much alcohol.  Eat a diet that is: ? High in fat and red meat. ? Low in fiber.  Had childhood cancer that was treated with abdominal radiation.  What are the signs or symptoms? Most polyps do not cause symptoms. If you have symptoms, they may include:  Blood coming from your rectum when having a bowel movement.  Blood in your stool.The stool may look dark red or black.  A change in bowel habits, such as constipation or diarrhea.  How is this diagnosed? This condition is diagnosed with a colonoscopy. This is a procedure that uses a lighted, flexible scope to look at the inside of your colon. How is this treated? Treatment for this condition involves removing any polyps that are found. Those polyps will then be tested for cancer. If cancer is found, your health care provider will talk to you about options for colon cancer treatment. Follow these instructions at home: Diet  Eat plenty of fiber, such as fruits, vegetables, and whole grains.  Eat foods that are high in calcium and vitamin D, such as milk, cheese, yogurt, eggs, liver, fish, and broccoli.  Limit  foods high in fat, red meats, and processed meats, such as hot dogs, sausage, bacon, and lunch meats.  Maintain a healthy weight, or lose weight if recommended by your health care  provider. General instructions  Do not smoke cigarettes.  Do not drink alcohol excessively.  Keep all follow-up visits as told by your health care provider. This is important. This includes keeping regularly scheduled colonoscopies. Talk to your health care provider about when you need a colonoscopy.  Exercise every day or as told by your health care provider. Contact a health care provider if:  You have new or worsening bleeding during a bowel movement.  You have new or increased blood in your stool.  You have a change in bowel habits.  You unexpectedly lose weight. This information is not intended to replace advice given to you by your health care provider. Make sure you discuss any questions you have with your health care provider. Document Released: 01/05/2004 Document Revised: 09/16/2015 Document Reviewed: 03/01/2015 Elsevier Interactive Patient Education  [employer].

## 2016-09-27 NOTE — Anesthesia Preprocedure Evaluation (Signed)
Anesthesia Evaluation  Patient identified by MRN, date of birth, ID band Patient awake    Reviewed: Allergy & Precautions, NPO status , Patient's Chart, lab work & pertinent test results  Airway Mallampati: II  TM Distance: >3 FB     Dental  (+) Teeth Intact   Pulmonary Current Smoker,    breath sounds clear to auscultation       Cardiovascular hypertension, Pt. on medications + CAD and + Peripheral Vascular Disease  CABG: iliac stent.  + Valvular Problems/Murmurs AS  Rhythm:Regular Rate:Normal     Neuro/Psych    GI/Hepatic GERD  Medicated and Controlled,  Endo/Other    Renal/GU      Musculoskeletal   Abdominal   Peds  Hematology   Anesthesia Other Findings   Reproductive/Obstetrics                             Anesthesia Physical Anesthesia Plan  ASA: III  Anesthesia Plan: MAC   Post-op Pain Management:    Induction: Intravenous  PONV Risk Score and Plan:   Airway Management Planned: Simple Face Mask  Additional Equipment:   Intra-op Plan:   Post-operative Plan:   Informed Consent: I have reviewed the patients History and Physical, chart, labs and discussed the procedure including the risks, benefits and alternatives for the proposed anesthesia with the patient or authorized representative who has indicated his/her understanding and acceptance.     Plan Discussed with:   Anesthesia Plan Comments:         Anesthesia Quick Evaluation

## 2016-09-27 NOTE — H&P (Signed)
@LOGO @   Primary Care Physician:  Octavio Graves, DO Primary Gastroenterologist:  Dr. Gala Romney  Pre-Procedure History & Physical: HPI:  Michelle Patton is a 61 y.o. female here for evaluation of chronic diarrhea. Last colonoscopy over 10 years ago. Intermittent esophageal dysphagia. Reflux symptoms better now that she is back on AcipHex.  Past Medical History:  Diagnosis Date  . Anxiety   . Aortic regurgitation 07/20/2014  . Aortic stenosis, severe 07/20/2014  . DVT (deep venous thrombosis) ([place])   . Family history of adverse reaction to anesthesia    Reports father deliurm in his 64's with CABG  . GERD (gastroesophageal reflux disease)   . History of pneumonia   . Hyperlipidemia   . Hypertension   . Insomnia, unspecified   . Muscle weakness (generalized)   . Peripheral artery disease ([place])   . 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 Sleep apnea    diagnosed multiple years ago at [facility]  . Supravalvular aortic stenosis, congenital - s/p repair during childhood     Past Surgical History:  Procedure Laterality Date  . ABDOMINAL AORTAGRAM  06/24/12  . ABDOMINAL AORTAGRAM N/A 06/24/2012   Procedure: ABDOMINAL Maxcine Ham;  Surgeon: Angelia Mould, MD;  Location: [facility] CATH LAB;  Service: Cardiovascular;  Laterality: N/A;  . AORTIC VALVE REPLACEMENT N/A 10/07/2014   Procedure: REDO AORTIC VALVE REPLACEMENT (AVR);  Surgeon: Rexene Alberts, MD;  Location: [place];  Service: Open Heart Surgery;  Laterality: N/A;  . ASCENDING AORTIC ROOT REPLACEMENT N/A 10/07/2014   Procedure: ASCENDING AORTIC ROOT REPLACEMENT;  Surgeon: Rexene Alberts, MD;  Location: [place];  Service: Open Heart Surgery;  Laterality: N/A;  . CARDIAC VALVE SURGERY  1968  . CERVICAL FUSION    . CHOLECYSTECTOMY    . ILIAC ARTERY STENT Left 12/2007   . LEFT AND RIGHT HEART CATHETERIZATION WITH CORONARY ANGIOGRAM N/A 07/31/2014   Procedure: LEFT AND RIGHT HEART CATHETERIZATION WITH CORONARY ANGIOGRAM;  Surgeon: Burnell Blanks, MD;  Location[facility] CATH LAB;  Service: Cardiovascular;  Laterality: N/A;  . ROTATOR CUFF REPAIR Bilateral   . TEE WITHOUT CARDIOVERSION N/A 07/07/2014   Procedure: TRANSESOPHAGEAL ECHOCARDIOGRAM (TEE);  Surgeon: Arnoldo Lenis, MD;  Location: [facility];  Service: Cardiology;  Laterality: N/A;  . TEE WITHOUT CARDIOVERSION N/A 07/20/2014   Procedure: TRANSESOPHAGEAL ECHOCARDIOGRAM (TEE) WITH PROPOFOL;  Surgeon: Arnoldo Lenis, MD;  Location: [facility] ORS;  Service: Endoscopy;  Laterality: N/A;  . TEE WITHOUT CARDIOVERSION N/A 10/07/2014   Procedure: TRANSESOPHAGEAL ECHOCARDIOGRAM (TEE);  Surgeon: Rexene Alberts, MD;  Location: [place];  Service: Open Heart Surgery;  Laterality: N/A;    Prior to Admission medications   Medication Sig Start Date End Date Taking? Authorizing Provider  aspirin 81 MG tablet Take 81 mg by mouth daily.   Yes [provider]  Calcium Carb-Cholecalciferol (CALCIUM 600+D3) 600-800 MG-UNIT TABS Take 1 tablet by mouth every morning.    Yes [provider]  Cholecalciferol (VITAMIN D3) 5000 UNITS TABS Take 5,000 Units by mouth every morning.    Yes [provider]  clonazePAM (KLONOPIN) 0.5 MG tablet Take 1 tablet (0.5 mg total) by mouth at bedtime. 09/26/12  Yes Marcial Pacas, MD  Cranberry-Vitamin C-Vitamin E (CRANBERRY PLUS VITAMIN C) 4200-20-3 MG-MG-UNIT CAPS Take 1 capsule by mouth every morning.  Yes [provider]  estradiol (ESTRACE) 1 MG tablet Take 1 mg by mouth every morning.    Yes [provider]  Magnesium 250 MG TABS Take 250 mg by mouth every morning.    Yes [provider]  medroxyPROGESTERone (PROVERA) 2.5 MG tablet Take 2.5 mg by mouth every morning.    Yes [provider]  metoprolol tartrate (LOPRESSOR) 25 MG  tablet Take 1 tablet (25 mg total) by mouth 2 (two) times daily. 03/01/16  Yes BranchAlphonse Guild, MD  Multiple Vitamins-Minerals (MULTIVITAMIN WITH MINERALS) tablet Take 1 tablet by mouth every morning.    Yes [provider]  polyethylene glycol-electrolytes (TRILYTE) 420 g solution Take 4,000 mLs by mouth as directed. 09/07/16  Yes Keyira Mondesir, Cristopher Estimable, MD  Potassium 99 MG TABS Take 99 mg by mouth every morning. OTC   Yes [provider]  RABEprazole (ACIPHEX) 20 MG tablet Take 20 mg by mouth daily.   Yes [provider]  zolpidem (AMBIEN) 10 MG tablet Take 10 mg by mouth at bedtime as needed for sleep.  08/24/15  Yes [provider]    Allergies as of 08/10/2016 - Review Complete 08/10/2016  Allergen Reaction Noted  . Penicillins Hives   . Sulfa antibiotics Nausea And Vomiting 06/19/2012    Family History  Problem Relation Age of Onset  . Hypertension Mother   . Hypertension Father   . Heart disease Father        before age 49  . Other Father        varicose veins  . Colon cancer Neg Hx   . Celiac disease Neg Hx   . Inflammatory bowel disease Neg Hx     Social History   Social History  . Marital status: Married    Spouse name: N/A  . Number of children: 0  . Years of education: some coll   Occupational History  . call center [employer]   Social History Main Topics  . Smoking status: Current Every Day Smoker    Packs/day: 0.50    Years: 40.00    Types: Cigarettes  . Smokeless tobacco: Never Used  . Alcohol use 2.4 oz/week    4 Cans of beer per week     Comment: 4 drinks per week  . Drug use: No  . Sexual activity: Yes    Birth control/ protection: None   Other Topics Concern  . Not on file   Social History Narrative   Patient is single and lives at home with her partner. Patient works at [employer] center. Some college education. Patient drinks coffee and tea.    Review of Systems: See HPI, otherwise negative ROS  Physical  Exam: Temp 97.8 F (36.6 C) (Oral)  General:   Alert,  Well-developed, well-nourished, pleasant and cooperative in NAD Neck:  Supple; no masses or thyromegaly. No significant cervical adenopathy. Lungs:  Clear throughout to auscultation.   No wheezes, crackles, or rhonchi. No acute distress. Heart:  Regular rate and rhythm; no murmurs, clicks, rubs,  or gallops. Abdomen: Non-distended, normal bowel sounds.  Soft and nontender without appreciable mass or hepatosplenomegaly.  Pulses:  Normal pulses noted. Extremities:  Without clubbing or edema.  Impression:  Pleasant 61 year old lady with esophageal dysphagia in the setting of long-standing GERD. History of Schatzki's ring. Chronic diarrhea. Colonoscopy over 10 years ago. Serological Celiac screen negative  Recommendations:  I have offered the patient both an EGD with esophageal dilation as  feasible/appropriate along with a diagnostic colonoscopy per plan.  The risks, benefits, limitations, imponderables and alternatives regarding both EGD and colonoscopy have been reviewed with the patient. Questions have been answered. All parties agreeable.    Notice: This dictation was prepared with Dragon dictation along with smaller phrase technology. Any transcriptional errors that result from this process are unintentional and may not be corrected upon review.

## 2016-09-27 NOTE — Anesthesia Postprocedure Evaluation (Signed)
Anesthesia Post Note  Patient: Michelle Patton  Procedure(s) Performed: Procedure(s) (LRB): COLONOSCOPY WITH PROPOFOL (N/A) ESOPHAGOGASTRODUODENOSCOPY (EGD) WITH PROPOFOL (N/A) MALONEY DILATION (N/A) BIOPSY POLYPECTOMY  Patient location during evaluation: Short Stay Anesthesia Type: MAC Level of consciousness: awake and alert Pain management: satisfactory to patient Vital Signs Assessment: post-procedure vital signs reviewed and stable Respiratory status: spontaneous breathing Cardiovascular status: stable Anesthetic complications: no     Last Vitals:  Vitals:   09/27/16 1425 09/27/16 1426  BP:  (!) 143/69  Pulse: (!) 57 (!) 58  Resp: 13 14  Temp: 36.7 C 36.7 C    Last Pain:  Vitals:   09/27/16 1426  TempSrc: Oral  PainSc:                  Drucie Opitz

## 2016-09-27 NOTE — Op Note (Signed)
[facility] Patient Name: Michelle Patton Procedure Date: 09/27/2016 12:32 PM MRN: [id number] Date of Birth: Jul 14, 1955 Attending MD: Norvel Richards , MD CSN: [id number] Age: 61 Admit Type: Outpatient Procedure:                Upper GI endoscopy Indications:              Dysphagia, Heartburn Providers:                Norvel Richards, MD, Jeanann Lewandowsky. Sharon Seller, RN,                            Randa Spike, Technician Referring MD:              Medicines:                Propofol per Anesthesia Complications:            No immediate complications. Estimated Blood Loss:     Estimated blood loss was minimal. Procedure:                Pre-Anesthesia Assessment:                           - Prior to the procedure, a History and Physical                            was performed, and patient medications and                            allergies were reviewed. The patient's tolerance of                            previous anesthesia was also reviewed. The risks                            and benefits of the procedure and the sedation                            options and risks were discussed with the patient.                            All questions were answered, and informed consent                            was obtained. Prior Anticoagulants: The patient has                            taken no previous anticoagulant or antiplatelet                            agents. ASA Grade Assessment: II - A patient with                            mild systemic disease. After reviewing the risks  and benefits, the patient was deemed in                            satisfactory condition to undergo the procedure.                           After obtaining informed consent, the endoscope was                            passed under direct vision. Throughout the                            procedure, the patient's blood pressure, pulse, and                            oxygen  saturations were monitored continuously. The                            [id number] ([id number]) scope was introduced through the                            mouth, and advanced to the second part of duodenum.                            The upper GI endoscopy was accomplished without                            difficulty. The patient tolerated the procedure                            well. Scope In: 1:03:38 PM Scope Out: 1:13:35 PM Total Procedure Duration: 0 hours 9 minutes 57 seconds  Findings:      A small hiatal hernia was present.      The duodenal bulb and second portion of the duodenum were normal.      A mild Schatzki ring (acquired) was found at the gastroesophageal       junction.      LA Grade A (one or more mucosal breaks less than 5 mm, not extending       between tops of 2 mucosal folds) esophagitis was found 35 cm from the       incisors. No Barrett's epithelium seen. The scope was withdrawn.       Dilation was performed with a Maloney dilator with no resistance at 15       Fr. The scope was withdrawn. Dilation was performed with a Maloney       dilator with mild resistance at 56 Fr. The dilation site was examined       following endoscope reinsertion and showed no change. Estimated blood       loss: none. A look back revealed the ring remain intact. Biopsy forceps       utilized to disrupt ring with 2 "bites". Impression:               - Small hiatal hernia.                           -  Normal duodenal bulb and second portion of the                            duodenum.                           - Mild Schatzki ring.                           - LA Grade A esophagitis. Dilated.                           - No specimens collected. Moderate Sedation:      Moderate (conscious) sedation was personally administered by an       anesthesia professional. The following parameters were monitored: oxygen       saturation, heart rate, blood pressure, respiratory rate, EKG, adequacy       of  pulmonary ventilation, and response to care. Total physician       intraservice time was 18 minutes. Recommendation:           - The signs and symptoms of potential delayed                            complications were discussed with the patient.                           - Patient has a contact number available for                            emergencies.                           - Return to normal activities tomorrow.                           - Resume previous diet.                           - Continue present medications. Increase AcipHex to                            20 mg twice daily. Office visit in 3 months. See                            colonoscopy report.                           -                           - Return to GI clinic in 3 months. Procedure Code(s):        --- Professional ---                           [phone], Esophagogastroduodenoscopy, flexible,  transoral; diagnostic, including collection of                            specimen(s) by brushing or washing, when performed                            (separate procedure)                           43450, Dilation of esophagus, by unguided sound or                            bougie, single or multiple passes Diagnosis Code(s):        --- Professional ---                           K44.9, Diaphragmatic hernia without obstruction or                            gangrene                           K22.2, Esophageal obstruction                           K20.9, Esophagitis, unspecified                           R13.10, Dysphagia, unspecified                           R12, Heartburn CPT copyright 2016 American Medical Association. All rights reserved. The codes documented in this report are preliminary and upon coder review may  be revised to meet current compliance requirements. Cristopher Estimable. Kallie Depolo, MD Norvel Richards, MD 09/27/2016 1:50:30 PM This report has been signed electronically. Number of  Addenda: 0

## 2016-09-30 ENCOUNTER — Encounter: Payer: Self-pay | Admitting: Internal Medicine

## 2016-10-03 ENCOUNTER — Telehealth: Payer: Self-pay

## 2016-10-03 NOTE — Telephone Encounter (Signed)
Per RMR-  Rourk, Cristopher Estimable, MD  Claudina Lick, LPN; Theadora Rama        Send letter to patient.  Send copy of letter with path to referring provider and PCP.  Should have an ov in about 3 mos.

## 2016-10-03 NOTE — Telephone Encounter (Signed)
OV made °

## 2016-10-03 NOTE — Telephone Encounter (Signed)
Letter mailed to the pt. 

## 2016-10-04 ENCOUNTER — Telehealth: Payer: Self-pay

## 2016-10-04 ENCOUNTER — Encounter (HOSPITAL_COMMUNITY): Payer: Self-pay | Admitting: Internal Medicine

## 2016-10-04 NOTE — Telephone Encounter (Signed)
Pt is calling to see about the diarrhea. She said that the diarrhea is the reason  She had the TCS in the first place. Please advise

## 2016-10-05 NOTE — Telephone Encounter (Signed)
Hopefully she received the  letter. No inflammatory bowel disease, no microscopic colitis and no evidence of Crohn's disease on recent colonoscopy. A lot of ground covered. If none of the above causes, further evaluation of diarrhea needed. She should have an office visit to evaluate further.

## 2016-10-05 NOTE — Telephone Encounter (Signed)
Pt is aware of what RMR said

## 2016-12-20 ENCOUNTER — Ambulatory Visit (INDEPENDENT_AMBULATORY_CARE_PROVIDER_SITE_OTHER): Payer: 59 | Admitting: Cardiology

## 2016-12-20 ENCOUNTER — Encounter: Payer: Self-pay | Admitting: Cardiology

## 2016-12-20 VITALS — BP 128/70 | HR 78 | Ht 66.5 in | Wt 231.0 lb

## 2016-12-20 DIAGNOSIS — I739 Peripheral vascular disease, unspecified: Secondary | ICD-10-CM | POA: Diagnosis not present

## 2016-12-20 DIAGNOSIS — I350 Nonrheumatic aortic (valve) stenosis: Secondary | ICD-10-CM

## 2016-12-20 DIAGNOSIS — I10 Essential (primary) hypertension: Secondary | ICD-10-CM

## 2016-12-20 DIAGNOSIS — R5383 Other fatigue: Secondary | ICD-10-CM | POA: Diagnosis not present

## 2016-12-20 DIAGNOSIS — R0602 Shortness of breath: Secondary | ICD-10-CM | POA: Diagnosis not present

## 2016-12-20 MED ORDER — FUROSEMIDE 20 MG PO TABS: 20.0000 mg | ORAL_TABLET | Freq: Every day | ORAL | 3 refills | Status: DC | PRN

## 2016-12-20 NOTE — Progress Notes (Signed)
Clinical Summary Michelle Patton is a 61 y.o.female seen today for follow up of the following medical problems.   1. Aortic stenosis - hx in 1968 at age of 53 of supravavlular aortic stenosis, corrective surgery with patching at that time.  - recently found to have developed severe aortic valvular stenosis with possible recurrence of supravalvular stenosis as well based on imaging. 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  - Medtronic Freestyle stentless porcine valve/aortic root graft (size 21 mm, model # 995, serial # [id number])    - no recent SOB/DOE. Has had some low energy/fatigue - sedentary lifestyle. Reports recent increase in LE edema.   2. PAD - followed by vascular, history of left external iliac stent.  - denies any recent leg pains   3. HTN - compliant with meds.  - cough on ACE-I, discontinued   4. HL - leg pains on lipitor - currently diet controlled    SH: [name] is also a patient of mine.   Trip to [place] to visit her mother. Born and raised in [place] to [place]. Father passed in February.   Past Medical History:  Diagnosis Date  . Anxiety   . Aortic regurgitation 07/20/2014  . Aortic stenosis, severe 07/20/2014  . DVT (deep venous thrombosis) ([place])   . Family history of adverse reaction to anesthesia    Reports father deliurm in his 65's with CABG  . GERD (gastroesophageal reflux disease)   . History of pneumonia   . Hyperlipidemia   . Hypertension   . Insomnia, unspecified   . Muscle weakness (generalized)   . Peripheral artery disease ([place])   . 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 Sleep apnea    diagnosed multiple years  ago at [facility]  . Supravalvular aortic stenosis, congenital - s/p repair during childhood      Allergies  Allergen Reactions  . Penicillins Hives    Has patient had a PCN reaction causing immediate rash, facial/tongue/throat swelling, SOB or lightheadedness with hypotension: Yes Has patient had a PCN reaction causing severe rash involving mucus membranes or skin necrosis: No Has patient had a PCN reaction that required hospitalization No Has patient had a PCN reaction occurring within the last 10 years: No If all of the above answers are "NO", then may proceed with Cephalosporin use.  Have taken Keflex before without any adverse reaction.   . Sulfa Antibiotics Nausea And Vomiting     Current Outpatient Prescriptions  Medication Sig Dispense Refill  . aspirin 81 MG tablet Take 81 mg by mouth daily.    . Calcium Carb-Cholecalciferol (CALCIUM 600+D3) 600-800 MG-UNIT TABS Take 1 tablet by mouth every morning.     . Cholecalciferol (VITAMIN D3) 5000 UNITS TABS Take 5,000 Units by mouth every morning.     . clonazePAM (KLONOPIN) 0.5 MG tablet Take 1 tablet (0.5 mg total) by mouth at bedtime. 30 tablet 5  . Cranberry-Vitamin C-Vitamin E (CRANBERRY PLUS VITAMIN C) 4200-20-3 MG-MG-UNIT CAPS Take 1 capsule by mouth every morning.     [name] estradiol (ESTRACE) 1 MG tablet Take 1 mg by mouth every morning.     . Magnesium 250 MG TABS Take 250 mg by mouth every  morning.     . medroxyPROGESTERone (PROVERA) 2.5 MG tablet Take 2.5 mg by mouth every morning.     . metoprolol tartrate (LOPRESSOR) 25 MG tablet Take 1 tablet (25 mg total) by mouth 2 (two) times daily. 180 tablet 3  . Multiple Vitamins-Minerals (MULTIVITAMIN WITH MINERALS) tablet Take 1 tablet by mouth every morning.     . polyethylene glycol-electrolytes (TRILYTE) 420 g solution Take 4,000 mLs by mouth as directed. 4000 mL 0  . Potassium 99 MG TABS Take 99 mg by mouth every morning. OTC    . RABEprazole (ACIPHEX) 20 MG tablet Take 20 mg by  mouth daily.    [name] zolpidem (AMBIEN) 10 MG tablet Take 10 mg by mouth at bedtime as needed for sleep.      No current facility-administered medications for this visit.      Past Surgical History:  Procedure Laterality Date  . ABDOMINAL AORTAGRAM  06/24/12  . ABDOMINAL AORTAGRAM N/A 06/24/2012   Procedure: ABDOMINAL Maxcine Ham;  Surgeon: Angelia Mould, MD;  Location: [facility] CATH LAB;  Service: Cardiovascular;  Laterality: N/A;  . AORTIC VALVE REPLACEMENT N/A 10/07/2014   Procedure: REDO AORTIC VALVE REPLACEMENT (AVR);  Surgeon: Rexene Alberts, MD;  Location: [facility];  Service: Open Heart Surgery;  Laterality: N/A;  . ASCENDING AORTIC ROOT REPLACEMENT N/A 10/07/2014   Procedure: ASCENDING AORTIC ROOT REPLACEMENT;  Surgeon: Rexene Alberts, MD;  Location: [place];  Service: Open Heart Surgery;  Laterality: N/A;  . BIOPSY  09/27/2016   Procedure: BIOPSY;  Surgeon: Daneil Dolin, MD;  [facility];  Service: Endoscopy;;  colon  . [place]  . CERVICAL FUSION    . CHOLECYSTECTOMY    . COLONOSCOPY WITH PROPOFOL N/A 09/27/2016   Procedure: COLONOSCOPY WITH PROPOFOL;  Surgeon: Daneil Dolin, MD;  [facility];  Service: Endoscopy;  Laterality: N/A;  1:00pm  . ESOPHAGOGASTRODUODENOSCOPY (EGD) WITH PROPOFOL N/A 09/27/2016   Procedure: ESOPHAGOGASTRODUODENOSCOPY (EGD) WITH PROPOFOL;  Surgeon: Daneil Dolin, MD;  [facility];  Service: Endoscopy;  Laterality: N/A;  . ILIAC ARTERY STENT Left 12/2007  . LEFT AND RIGHT HEART CATHETERIZATION WITH CORONARY ANGIOGRAM N/A 07/31/2014   Procedure: LEFT AND RIGHT HEART CATHETERIZATION WITH CORONARY ANGIOGRAM;  Surgeon: Burnell Blanks, MD;  Location: [facility] CATH LAB;  Service: Cardiovascular;  Laterality: N/A;  . Venia Minks DILATION N/A 09/27/2016   Procedure: Venia Minks DILATION;  Surgeon: Daneil Dolin, MD;  [facility];  Service: Endoscopy;  Laterality: N/A;  . POLYPECTOMY  09/27/2016   Procedure:  POLYPECTOMY;  Surgeon: Daneil Dolin, MD;  [facility];  Service: Endoscopy;;  colon  . ROTATOR CUFF REPAIR Bilateral   . TEE WITHOUT CARDIOVERSION N/A 07/07/2014   Procedure: TRANSESOPHAGEAL ECHOCARDIOGRAM (TEE);  Surgeon: Arnoldo Lenis, MD;  [facility];  Service: Cardiology;  Laterality: N/A;  . TEE WITHOUT CARDIOVERSION N/A 07/20/2014   Procedure: TRANSESOPHAGEAL ECHOCARDIOGRAM (TEE) WITH PROPOFOL;  Surgeon: Arnoldo Lenis, MD;  Location[facility];  Service: Endoscopy;  Laterality: N/A;  . TEE WITHOUT CARDIOVERSION N/A 10/07/2014   Procedure: TRANSESOPHAGEAL ECHOCARDIOGRAM (TEE);  Surgeon: Rexene Alberts, MD;  Location: [place];  Service: Open Heart Surgery;  Laterality: N/A;     Allergies  Allergen Reactions  . Penicillins Hives    Has patient had a PCN reaction causing immediate rash, facial/tongue/throat swelling, SOB or lightheadedness with hypotension: Yes Has patient had a PCN reaction causing  severe rash involving mucus membranes or skin necrosis: No Has patient had a PCN reaction that required hospitalization No Has patient had a PCN reaction occurring within the last 10 years: No If all of the above answers are "NO", then may proceed with Cephalosporin use.  Have taken Keflex before without any adverse reaction.   . Sulfa Antibiotics Nausea And Vomiting      Family History  Problem Relation Age of Onset  . Hypertension Mother   . Hypertension Father   . Heart disease Father        before age 29  . Other Father        varicose veins  . Colon cancer Neg Hx   . Celiac disease Neg Hx   . Inflammatory bowel disease Neg Hx      Social History Ms. Martell reports that she has been smoking Cigarettes.  She has a 20.00 pack-year smoking history. She has never used smokeless tobacco. Ms. Christianson reports that she drinks about 2.4 oz of alcohol per week .   Review of Systems CONSTITUTIONAL: +fatigue.  HEENT: Eyes: No visual loss, blurred vision,  double vision or yellow sclerae.No hearing loss, sneezing, congestion, runny nose or sore throat.  SKIN: No rash or itching.  CARDIOVASCULAR: per hpi RESPIRATORY: No shortness of breath, cough or sputum.  GASTROINTESTINAL: No anorexia, nausea, vomiting or diarrhea. No abdominal pain or blood.  GENITOURINARY: No burning on urination, no polyuria NEUROLOGICAL: No headache, dizziness, syncope, paralysis, ataxia, numbness or tingling in the extremities. No change in bowel or bladder control.  MUSCULOSKELETAL: No muscle, back pain, joint pain or stiffness.  LYMPHATICS: No enlarged nodes. No history of splenectomy.  PSYCHIATRIC: No history of depression or anxiety.  ENDOCRINOLOGIC: No reports of sweating, cold or heat intolerance. No polyuria or polydipsia.  [name]   Physical Examination Vitals:   12/20/16 1518  BP: 128/70  Pulse: 78  SpO2: 97%   Vitals:   12/20/16 1518  Weight: 231 lb (104.8 kg)  Height: 5' 6.5" (1.689 m)    Gen: resting comfortably, no acute distress HEENT: no scleral icterus, pupils equal round and reactive, no palptable cervical adenopathy,  CV: RRR, 2/6 systolic murmur rusb, no jvd Resp: Clear to auscultation bilaterally GI: abdomen is soft, non-tender, non-distended, normal bowel sounds, no hepatosplenomegaly MSK: extremities are warm, no edema.  Skin: warm, no rash Neuro:  no focal deficits Psych: appropriate affect   Diagnostic Studies 11/2015 echo Study Conclusions  - Left ventricle: The cavity size was normal. Wall thickness was   normal. Systolic function was vigorous. The estimated ejection   fraction was in the range of 65% to 70%. Wall motion was normal;   there were no regional wall motion abnormalities. Doppler   parameters are consistent with abnormal left ventricular   relaxation (grade 1 diastolic dysfunction). - Aortic valve: 21 mm Medtronic Freestyle stentless porcine   valve/aortic root graft in place. There was no significant    regurgitation. Peak gradient (S): 19 mm Hg. Valve area (Vmax):   1.83 cm^2. - Mitral valve: Calcified annulus. There was trivial regurgitation. - Right ventricle: The cavity size was mildly dilated. - Right atrium: Central venous pressure (est): 3 mm Hg. - Atrial septum: The septum bowed from right to left, consistent   with increased right atrial pressure. - Tricuspid valve: There was mild regurgitation. - Pulmonic valve: Peak gradient (S): 19 mm Hg. - Pulmonary arteries: PA peak pressure: 21 mm Hg (S). - Pericardium, extracardiac: There was no  pericardial effusion.  Impressions:  - Normal LV wall thickness with LVEF 65-70%. Grade 1 diastolic   dysfunction. MAC with trivial mitral regurgitation. Bioprosthesis   in aortic position as described above with no significant   perivalvular regurgitation and peak gradient 19 mmHg. Mild RV   enlargement. Mild tricuspid regurgitation with PASP estimated 21   mmHg.    Assessment and Plan  1. Aortic stenosis - s/p tissue AVR and aortic root graft placement - recent nonspecific fatigue and increased LE edema. WE will obtain an echo to evaluate function of her prothstetic valve - start lasix 49m prn for leg swelling.  2. HTN -her bp is at goal,continue current meds  3. PAD - no current symptoms, continue to follow  4. OSA - poor cpap compliance, encouraged increased use, may be associted with faitue  F/u pending test results      JArnoldo Lenis M.D.

## 2016-12-20 NOTE — Patient Instructions (Signed)
Medication Instructions:  Start lasix 20 mg ( daily as needed for swelling)   Labwork: none  Testing/Procedures: Your physician has requested that you have an echocardiogram. Echocardiography is a painless test that uses sound waves to create images of your heart. It provides your doctor with information about the size and shape of your heart and how well your heart's chambers and valves are working. This procedure takes approximately one hour. There are no restrictions for this procedure.    Follow-Up: Your physician recommends that you schedule a follow-up appointment in: to be determined    Any Other Special Instructions Will Be Listed Below (If Applicable).     If you need a refill on your cardiac medications before your next appointment, please call your pharmacy.

## 2016-12-26 ENCOUNTER — Encounter: Payer: Self-pay | Admitting: Orthopaedic Surgery

## 2016-12-26 ENCOUNTER — Ambulatory Visit (INDEPENDENT_AMBULATORY_CARE_PROVIDER_SITE_OTHER): Payer: 59 | Admitting: Orthopaedic Surgery

## 2016-12-26 VITALS — BP 127/79 | HR 75 | Temp 97.7°F | Ht 67.0 in | Wt 228.0 lb

## 2016-12-26 DIAGNOSIS — S52572A Other intraarticular fracture of lower end of left radius, initial encounter for closed fracture: Secondary | ICD-10-CM | POA: Diagnosis not present

## 2016-12-26 DIAGNOSIS — F17210 Nicotine dependence, cigarettes, uncomplicated: Secondary | ICD-10-CM

## 2016-12-26 NOTE — Patient Instructions (Addendum)
Steps to Quit Smoking Smoking tobacco can be bad for your health. It can also affect almost every organ in your body. Smoking puts you and people around you at risk for many serious Atilla Zollner-lasting (chronic) diseases. Quitting smoking is hard, but it is one of the best things that you can do for your health. It is never too late to quit. What are the benefits of quitting smoking? When you quit smoking, you lower your risk for getting serious diseases and conditions. They can include:  Lung cancer or lung disease.  Heart disease.  Stroke.  Heart attack.  Not being able to have children (infertility).  Weak bones (osteoporosis) and broken bones (fractures).  If you have coughing, wheezing, and shortness of breath, those symptoms may get better when you quit. You may also get sick less often. If you are pregnant, quitting smoking can help to lower your chances of having a baby of low birth weight. What can I do to help me quit smoking? Talk with your doctor about what can help you quit smoking. Some things you can do (strategies) include:  Quitting smoking totally, instead of slowly cutting back how much you smoke over a period of time.  Going to in-person counseling. You are more likely to quit if you go to many counseling sessions.  Using resources and support systems, such as: ? Online chats with a counselor. ? Phone quitlines. ? Printed self-help materials. ? Support groups or group counseling. ? Text messaging programs. ? Mobile phone apps or applications.  Taking medicines. Some of these medicines may have nicotine in them. If you are pregnant or breastfeeding, do not take any medicines to quit smoking unless your doctor says it is okay. Talk with your doctor about counseling or other things that can help you.  Talk with your doctor about using more than one strategy at the same time, such as taking medicines while you are also going to in-person counseling. This can help make  quitting easier. What things can I do to make it easier to quit? Quitting smoking might feel very hard at first, but there is a lot that you can do to make it easier. Take these steps:  Talk to your family and friends. Ask them to support and encourage you.  Call phone quitlines, reach out to support groups, or work with a counselor.  Ask people who smoke to not smoke around you.  Avoid places that make you want (trigger) to smoke, such as: ? Bars. ? Parties. ? Smoke-break areas at work.  Spend time with people who do not smoke.  Lower the stress in your life. Stress can make you want to smoke. Try these things to help your stress: ? Getting regular exercise. ? Deep-breathing exercises. ? Yoga. ? Meditating. ? Doing a body scan. To do this, close your eyes, focus on one area of your body at a time from head to toe, and notice which parts of your body are tense. Try to relax the muscles in those areas.  Download or buy apps on your mobile phone or tablet that can help you stick to your quit plan. There are many free apps, such as QuitGuide from the CDC (Centers for Disease Control and Prevention). You can find more support from smokefree.gov and other websites.  This information is not intended to replace advice given to you by your health care provider. Make sure you discuss any questions you have with your health care provider. Document Released: 02/04/2009 Document   Revised: 12/07/2015 Document Reviewed: 08/25/2014 Elsevier Interactive Patient Education  2018 [employer].  

## 2016-12-26 NOTE — Progress Notes (Signed)
Subjective:    Patient ID: Michelle Patton, female    DOB: 09-15-1955, 61 y.o.   MRN: [id number]  HPI She fell three weeks ago entering [facility] here and hurt her left wrist.  She did not seek medical attention.  She had swelling, bruising, pain and decreased motion.  This past weekend she went to visit her mother in [place], [place]. She went to the ER at her mother's insistence and x-rays showed a small nondisplaced fracture of the distal radius with slight intra-articular extension.  She was given a plaster splint.  She has no other injury. She is better with the splint.  She has taken Tylenol.  She smokes and is not willing to cut back.  Review of Systems  HENT: Negative for congestion.   Respiratory: Negative for cough and shortness of breath.   Cardiovascular: Negative for chest pain and leg swelling.  Endocrine: Negative for cold intolerance.  Musculoskeletal: Positive for arthralgias and joint swelling.  Allergic/Immunologic: Negative for environmental allergies.  Psychiatric/Behavioral: The patient is nervous/anxious.    Past Medical History:  Diagnosis Date  . Anxiety   . Aortic regurgitation 07/20/2014  . Aortic stenosis, severe 07/20/2014  . DVT (deep venous thrombosis) ([place])   . Family history of adverse reaction to anesthesia    Reports father deliurm in his 55's with CABG  . GERD (gastroesophageal reflux disease)   . History of pneumonia   . Hyperlipidemia   . Hypertension   . Insomnia, unspecified   . Muscle weakness (generalized)   . Peripheral artery disease ([place])   . 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 Sleep apnea    diagnosed multiple years ago at [facility]  . Supravalvular aortic stenosis, congenital - s/p repair during childhood     Past Surgical History:  Procedure  Laterality Date  . ABDOMINAL AORTAGRAM  06/24/12  . ABDOMINAL AORTAGRAM N/A 06/24/2012   Procedure: ABDOMINAL Maxcine Ham;  Surgeon: Angelia Mould, MD;  Location: [facility] CATH LAB;  Service: Cardiovascular;  Laterality: N/A;  . AORTIC VALVE REPLACEMENT N/A 10/07/2014   Procedure: REDO AORTIC VALVE REPLACEMENT (AVR);  Surgeon: Rexene Alberts, MD;  Location: [place];  Service: Open Heart Surgery;  Laterality: N/A;  . ASCENDING AORTIC ROOT REPLACEMENT N/A 10/07/2014   Procedure: ASCENDING AORTIC ROOT REPLACEMENT;  Surgeon: Rexene Alberts, MD;  Location: [place];  Service: Open Heart Surgery;  Laterality: N/A;  . BIOPSY  09/27/2016   Procedure: BIOPSY;  Surgeon: Daneil Dolin, MD;  [facility];  Service: Endoscopy;;  colon  . [place]  . CERVICAL FUSION    . CHOLECYSTECTOMY    . COLONOSCOPY WITH PROPOFOL N/A 09/27/2016   Procedure: COLONOSCOPY WITH PROPOFOL;  Surgeon: Daneil Dolin, MD;  [facility];  Service: Endoscopy;  Laterality: N/A;  1:00pm  . ESOPHAGOGASTRODUODENOSCOPY (EGD) WITH PROPOFOL N/A 09/27/2016   Procedure: ESOPHAGOGASTRODUODENOSCOPY (EGD) WITH PROPOFOL;  Surgeon: Daneil Dolin, MD;  [facility];  Service: Endoscopy;  Laterality: N/A;  . ILIAC ARTERY STENT Left 12/2007  . LEFT AND RIGHT HEART CATHETERIZATION WITH CORONARY ANGIOGRAM N/A 07/31/2014   Procedure: LEFT AND RIGHT HEART CATHETERIZATION WITH CORONARY ANGIOGRAM;  Surgeon: Burnell Blanks, MD;  Location: [facility] CATH LAB;  Service: Cardiovascular;  Laterality: N/A;  . MALONEY  DILATION N/A 09/27/2016   Procedure: Venia Minks DILATION;  Surgeon: Daneil Dolin, MD;  [facility];  Service: Endoscopy;  Laterality: N/A;  . POLYPECTOMY  09/27/2016   Procedure: POLYPECTOMY;  Surgeon: Daneil Dolin, MD;  [facility];  Service: Endoscopy;;  colon  . ROTATOR CUFF REPAIR Bilateral   . TEE WITHOUT CARDIOVERSION N/A 07/07/2014   Procedure: TRANSESOPHAGEAL ECHOCARDIOGRAM  (TEE);  Surgeon: Arnoldo Lenis, MD;  [facility];  Service: Cardiology;  Laterality: N/A;  . TEE WITHOUT CARDIOVERSION N/A 07/20/2014   Procedure: TRANSESOPHAGEAL ECHOCARDIOGRAM (TEE) WITH PROPOFOL;  Surgeon: Arnoldo Lenis, MD;  Location[facility];  Service: Endoscopy;  Laterality: N/A;  . TEE WITHOUT CARDIOVERSION N/A 10/07/2014   Procedure: TRANSESOPHAGEAL ECHOCARDIOGRAM (TEE);  Surgeon: Rexene Alberts, MD;  Location: [place];  Service: Open Heart Surgery;  Laterality: N/A;    Current Outpatient Prescriptions on File Prior to Visit  Medication Sig Dispense Refill  . estradiol (ESTRACE) 1 MG tablet Take 1 mg by mouth every morning.     . metoprolol tartrate (LOPRESSOR) 25 MG tablet Take 1 tablet (25 mg total) by mouth 2 (two) times daily. 180 tablet 3  . RABEprazole (ACIPHEX) 20 MG tablet Take 20 mg by mouth daily.    [name] aspirin 81 MG tablet Take 81 mg by mouth daily.    . Calcium Carb-Cholecalciferol (CALCIUM 600+D3) 600-800 MG-UNIT TABS Take 1 tablet by mouth every morning.     . Cholecalciferol (VITAMIN D3) 5000 UNITS TABS Take 5,000 Units by mouth every morning.     . clonazePAM (KLONOPIN) 0.5 MG tablet Take 1 tablet (0.5 mg total) by mouth at bedtime. 30 tablet 5  . Cranberry-Vitamin C-Vitamin E (CRANBERRY PLUS VITAMIN C) 4200-20-3 MG-MG-UNIT CAPS Take 1 capsule by mouth every morning.     . furosemide (LASIX) 20 MG tablet Take 1 tablet (20 mg total) by mouth daily as needed. 30 tablet 3  . Magnesium 250 MG TABS Take 250 mg by mouth every morning.     . medroxyPROGESTERone (PROVERA) 2.5 MG tablet Take 2.5 mg by mouth every morning.     . Multiple Vitamins-Minerals (MULTIVITAMIN WITH MINERALS) tablet Take 1 tablet by mouth every morning.     . Potassium 99 MG TABS Take 99 mg by mouth every morning. OTC    . zolpidem (AMBIEN) 10 MG tablet Take 10 mg by mouth at bedtime as needed for sleep.      No current facility-administered medications on file prior to visit.      Social History   Social History  . Marital status: Married    Spouse name: N/A  . Number of children: 0  . Years of education: some coll   Occupational History  . call center [employer]   Social History Main Topics  . Smoking status: Current Every Day Smoker    Packs/day: 0.50    Years: 40.00    Types: Cigarettes  . Smokeless tobacco: Never Used  . Alcohol use 2.4 oz/week    4 Cans of beer per week     Comment: 4 drinks per week  . Drug use: No  . Sexual activity: Yes    Birth control/ protection: None   Other Topics Concern  . Not on file   Social History Narrative   Patient is single and lives at home with her partner. Patient works at [employer] center. Some college education. Patient drinks coffee and tea.  Family History  Problem Relation Age of Onset  . Hypertension Mother   . Hypertension Father   . Heart disease Father        before age 13  . Other Father        varicose veins  . COPD Father   . Colon cancer Neg Hx   . Celiac disease Neg Hx   . Inflammatory bowel disease Neg Hx     BP 127/79   Pulse 75   Temp 97.7 F (36.5 C)   Ht 5\' 7"  (1.702 m)   Wt 228 lb (103.4 kg)   BMI 35.71 kg/m      Objective:   Physical Exam  Constitutional: She is oriented to person, place, and time. She appears well-developed and well-nourished.  HENT:  Head: Normocephalic and atraumatic.  Eyes: Pupils are equal, round, and reactive to light. Conjunctivae and EOM are normal.  Neck: Normal range of motion. Neck supple.  Cardiovascular: Normal rate, regular rhythm and intact distal pulses.   Pulmonary/Chest: Effort normal.  Abdominal: Soft.  Musculoskeletal: Tenderness: Left wrist with tenderness but almost full ROM. She is more tender distal radiuis. she has no swelling, NV intact.  Rght wrist negative.  Neurological: She is alert and oriented to person, place, and time. She displays normal reflexes. No cranial nerve deficit. She exhibits normal muscle tone.  Coordination normal.  Skin: Skin is warm and dry.  Psychiatric: She has a normal mood and affect. Her behavior is normal. Judgment and thought content normal.  Vitals reviewed.         Assessment & Plan:   Encounter Diagnoses  Name Primary?  . Other closed intra-articular fracture of distal end of left radius, initial encounter Yes  . Cigarette nicotine dependence without complication    I have given her a cock-up splint and told her she is to use it during the day.  She may remove at night and to bathe.  Return in two weeks.  X-rays on return.  Call if any problem.  Precautions discussed.    Electronically Signed Sanjuana Kava, MD 9/4/20189:21 AM

## 2016-12-28 ENCOUNTER — Ambulatory Visit (INDEPENDENT_AMBULATORY_CARE_PROVIDER_SITE_OTHER): Payer: 59 | Admitting: Gastroenterology

## 2016-12-28 ENCOUNTER — Encounter: Payer: Self-pay | Admitting: Gastroenterology

## 2016-12-28 VITALS — BP 139/80 | HR 71 | Temp 98.3°F | Ht 66.0 in | Wt 230.2 lb

## 2016-12-28 DIAGNOSIS — R197 Diarrhea, unspecified: Secondary | ICD-10-CM | POA: Diagnosis not present

## 2016-12-28 DIAGNOSIS — Z860100 Personal history of colon polyps, unspecified: Secondary | ICD-10-CM | POA: Insufficient documentation

## 2016-12-28 DIAGNOSIS — R1310 Dysphagia, unspecified: Secondary | ICD-10-CM

## 2016-12-28 DIAGNOSIS — R1013 Epigastric pain: Secondary | ICD-10-CM | POA: Diagnosis not present

## 2016-12-28 DIAGNOSIS — R1319 Other dysphagia: Secondary | ICD-10-CM

## 2016-12-28 NOTE — Assessment & Plan Note (Signed)
Surveillance colonoscopy in June 2021.

## 2016-12-28 NOTE — Progress Notes (Signed)
Primary Care Physician: Octavio Graves, DO  Primary Gastroenterologist:  Garfield Cornea, MD   Chief Complaint  Patient presents with  . Dysphagia    HPI: Michelle Patton is a 61 y.o. female here for follow-up. Seen back in April with complaints of GERD and diarrhea. When I saw her she was complaining of severe nausea, epigastric pain, esophageal dysphagia, left-sided abdominal pain. She had been off AcipHex for her reflux esophagitis due to loss of insurance. Reported diarrhea since coming off AcipHex. Denies NSAID and aspirin use. Complaining of 4-5 explosive bowel movements daily with urgency.Previously tried and failed omeprazole, ranitidine, Prevacid. Resuming a in 50% improvement of her abdominal pain and improvement of diarrhea. Previous labs with normal CBC, LFTs, negative celiac screen, normal TSH back in April.  EGD and colonoscopy performed on 09/27/2016. She has small hiatal hernia, mild Schatzki ring at the GE junction required biopsy forceps for disruption, LA grade a esophagitis noted. Colonoscopy revealed diverticulosis multiple semi-inflated polyps, ranging 4-7 mm in size were tubular adenomas, internal hemorrhoids grade 1, terminal ileum normal, segmental biopsies of the colon to evaluate for microscopic colitis were negative. Recommend have follow-up colonoscopy in 3 years. NIC in computer.   Cannot afford aciphex BID every night, about twice per week. Reflux is well-controlled. Swallowing food fine, meat and bread going down well. She is worried about a "new problem". She states she's not able to swallow her pills without taking liquid behind them. Tried it recently when she was visiting her mother because she did not have any liquid at her bedside when she was taking her pills. "I used to get dry swallow my pills". Denies odynophagia.   Denies abdominal pain. Bowel function is now normal. No melena rectal bleeding. No abdominal pain.     Current Outpatient  Prescriptions  Medication Sig Dispense Refill  . aspirin 81 MG tablet Take 81 mg by mouth daily.    . Calcium Carb-Cholecalciferol (CALCIUM 600+D3) 600-800 MG-UNIT TABS Take 1 tablet by mouth every morning.     . Cholecalciferol (VITAMIN D3) 5000 UNITS TABS Take 5,000 Units by mouth every morning.     . clonazePAM (KLONOPIN) 0.5 MG tablet Take 1 tablet (0.5 mg total) by mouth at bedtime. 30 tablet 5  . Cranberry-Vitamin C-Vitamin E (CRANBERRY PLUS VITAMIN C) 4200-20-3 MG-MG-UNIT CAPS Take 1 capsule by mouth every morning.     [name] estradiol (ESTRACE) 1 MG tablet Take 1 mg by mouth every morning.     . furosemide (LASIX) 20 MG tablet Take 1 tablet (20 mg total) by mouth daily as needed. 30 tablet 3  . Magnesium 250 MG TABS Take 250 mg by mouth every morning.     . medroxyPROGESTERone (PROVERA) 2.5 MG tablet Take 2.5 mg by mouth every morning.     . metoprolol tartrate (LOPRESSOR) 25 MG tablet Take 1 tablet (25 mg total) by mouth 2 (two) times daily. 180 tablet 3  . Multiple Vitamins-Minerals (MULTIVITAMIN WITH MINERALS) tablet Take 1 tablet by mouth every morning.     . Potassium 99 MG TABS Take 99 mg by mouth every morning. OTC    . RABEprazole (ACIPHEX) 20 MG tablet Take 20 mg by mouth daily.    [name] zolpidem (AMBIEN) 10 MG tablet Take 10 mg by mouth at bedtime as needed for sleep.      No current facility-administered medications for this visit.     Allergies as of 12/28/2016 - Review Complete 12/28/2016  Allergen Reaction  Noted  . Penicillins Hives   . Sulfa antibiotics Nausea And Vomiting 06/19/2012    ROS:  General: Negative for anorexia, weight loss, fever, chills, fatigue, weakness. ENT: Negative for hoarseness, difficulty swallowing , nasal congestion. CV: Negative for chest pain, angina, palpitations, dyspnea on exertion, peripheral edema.  Respiratory: Negative for dyspnea at rest, dyspnea on exertion, cough, sputum, wheezing.  GI: See history of present illness. GU:  Negative  for dysuria, hematuria, urinary incontinence, urinary frequency, nocturnal urination.  Endo: Negative for unusual weight change.    Physical Examination:   BP 139/80   Pulse 71   Temp 98.3 F (36.8 C) (Oral)   Ht 5\' 6"  (1.676 m)   Wt 230 lb 3.2 oz (104.4 kg)   BMI 37.16 kg/m   General: Well-nourished, well-developed in no acute distress.  Eyes: No icterus. Mouth: Oropharyngeal mucosa moist and pink , no lesions erythema or exudate. Lungs: Clear to auscultation bilaterally.  Heart: Regular rate and rhythm, no murmurs rubs or gallops.  Abdomen: Bowel sounds are normal, nontender, nondistended, no hepatosplenomegaly or masses, no abdominal bruits or hernia , no rebound or guarding.   Extremities: No lower extremity edema. No clubbing or deformities. Neuro: Alert and oriented x 4   Skin: Warm and dry, no jaundice.   Psych: Alert and cooperative, normal mood and affect.  Labs:  Lab Results  Component Value Date   TSH 1.87 08/10/2016   Lab Results  Component Value Date   CREATININE 0.89 09/22/2016   BUN 14 09/22/2016   NA 140 09/22/2016   K 3.7 09/22/2016   CL 107 09/22/2016   CO2 26 09/22/2016   Lab Results  Component Value Date   WBC 7.2 09/22/2016   HGB 13.7 09/22/2016   HCT 40.2 09/22/2016   MCV 90.3 09/22/2016   PLT 245 09/22/2016   Lab Results  Component Value Date   ALT 15 08/10/2016   AST 15 08/10/2016   ALKPHOS 54 08/10/2016   BILITOT 0.3 08/10/2016    Imaging Studies: No results found.

## 2016-12-28 NOTE — Assessment & Plan Note (Signed)
Schatzki ring status post disruption. Solid food dysphagia resolved. Encouraged her to take her medication with liquid. If she notices any changes swallowing solid foods she'll let us know. At that time would consider barium pill esophagram. See her back in one year.

## 2016-12-28 NOTE — Patient Instructions (Signed)
1. Continue Aciphex at least once daily. 2. Monitor for recurrent swallowing issues. 3. Return to the office in one year for follow up.

## 2016-12-28 NOTE — Assessment & Plan Note (Signed)
Resolved on AcipHex.

## 2016-12-28 NOTE — Assessment & Plan Note (Signed)
Resolved

## 2016-12-29 NOTE — Progress Notes (Signed)
cc'ed to pcp °

## 2017-01-01 ENCOUNTER — Ambulatory Visit (HOSPITAL_COMMUNITY): Payer: 59 | Attending: Cardiology

## 2017-01-10 ENCOUNTER — Ambulatory Visit (INDEPENDENT_AMBULATORY_CARE_PROVIDER_SITE_OTHER): Payer: 59

## 2017-01-10 ENCOUNTER — Ambulatory Visit (INDEPENDENT_AMBULATORY_CARE_PROVIDER_SITE_OTHER): Payer: 59 | Admitting: Orthopaedic Surgery

## 2017-01-10 DIAGNOSIS — S52572G Other intraarticular fracture of lower end of left radius, subsequent encounter for closed fracture with delayed healing: Secondary | ICD-10-CM

## 2017-01-10 DIAGNOSIS — S52572D Other intraarticular fracture of lower end of left radius, subsequent encounter for closed fracture with routine healing: Secondary | ICD-10-CM

## 2017-01-10 NOTE — Progress Notes (Signed)
CC:  My wrist is still tender at times  She is using the cock-up splint.  She has no new trauma.  She has some tenderness of the wrist depending on use.  NV intact.  ROM is good.  Grip is good on the left.  X-rays of the left wrist were done, reported separately.  Encounter Diagnoses  Name Primary?  . Closed die-punch fracture of left radius with routine healing, subsequent encounter   . Other closed intra-articular fracture of distal end of left radius with delayed healing, subsequent encounter Yes   Return in three weeks.  Precautions and exercises discussed.  If doing well, call and cancel.  If worse, call.  No X-rays on return unless having pain.  Electronically Signed Sanjuana Kava, MD 9/19/20183:19 PM

## 2017-01-14 ENCOUNTER — Other Ambulatory Visit: Payer: Self-pay | Admitting: Cardiology

## 2017-01-31 ENCOUNTER — Ambulatory Visit: Payer: 59 | Admitting: Orthopaedic Surgery

## 2017-04-26 ENCOUNTER — Ambulatory Visit: Payer: Self-pay | Admitting: Orthopaedic Surgery

## 2017-04-26 ENCOUNTER — Encounter: Payer: Self-pay | Admitting: Orthopaedic Surgery

## 2017-04-26 VITALS — BP 117/73 | HR 69 | Temp 97.7°F | Ht 66.0 in | Wt 230.0 lb

## 2017-04-26 DIAGNOSIS — S52572D Other intraarticular fracture of lower end of left radius, subsequent encounter for closed fracture with routine healing: Secondary | ICD-10-CM

## 2017-04-26 DIAGNOSIS — F17210 Nicotine dependence, cigarettes, uncomplicated: Secondary | ICD-10-CM

## 2017-04-26 NOTE — Progress Notes (Signed)
CC:  I have some swelling at times  She has some ulnar swelling of the left wrist at times.  She is not taking any NSAIDs.  She prefers aspirin.  I have advised one aspirin twice a day.  She has full ROM of the left wrist today and no swelling.  NV intact.  Encounter Diagnoses  Name Primary?  . Closed die-punch fracture of left radius with routine healing, subsequent encounter Yes  . Cigarette nicotine dependence without complication    I will see her PRN.  Electronically Signed Sanjuana Kava, MD 1/3/20199:57 AM

## 2017-04-26 NOTE — Patient Instructions (Signed)
Steps to Quit Smoking Smoking tobacco can be bad for your health. It can also affect almost every organ in your body. Smoking puts you and people around you at risk for many serious Alaena Strader-lasting (chronic) diseases. Quitting smoking is hard, but it is one of the best things that you can do for your health. It is never too late to quit. What are the benefits of quitting smoking? When you quit smoking, you lower your risk for getting serious diseases and conditions. They can include:  Lung cancer or lung disease.  Heart disease.  Stroke.  Heart attack.  Not being able to have children (infertility).  Weak bones (osteoporosis) and broken bones (fractures).  If you have coughing, wheezing, and shortness of breath, those symptoms may get better when you quit. You may also get sick less often. If you are pregnant, quitting smoking can help to lower your chances of having a baby of low birth weight. What can I do to help me quit smoking? Talk with your doctor about what can help you quit smoking. Some things you can do (strategies) include:  Quitting smoking totally, instead of slowly cutting back how much you smoke over a period of time.  Going to in-person counseling. You are more likely to quit if you go to many counseling sessions.  Using resources and support systems, such as: ? Online chats with a counselor. ? Phone quitlines. ? Printed self-help materials. ? Support groups or group counseling. ? Text messaging programs. ? Mobile phone apps or applications.  Taking medicines. Some of these medicines may have nicotine in them. If you are pregnant or breastfeeding, do not take any medicines to quit smoking unless your doctor says it is okay. Talk with your doctor about counseling or other things that can help you.  Talk with your doctor about using more than one strategy at the same time, such as taking medicines while you are also going to in-person counseling. This can help make  quitting easier. What things can I do to make it easier to quit? Quitting smoking might feel very hard at first, but there is a lot that you can do to make it easier. Take these steps:  Talk to your family and friends. Ask them to support and encourage you.  Call phone quitlines, reach out to support groups, or work with a counselor.  Ask people who smoke to not smoke around you.  Avoid places that make you want (trigger) to smoke, such as: ? Bars. ? Parties. ? Smoke-break areas at work.  Spend time with people who do not smoke.  Lower the stress in your life. Stress can make you want to smoke. Try these things to help your stress: ? Getting regular exercise. ? Deep-breathing exercises. ? Yoga. ? Meditating. ? Doing a body scan. To do this, close your eyes, focus on one area of your body at a time from head to toe, and notice which parts of your body are tense. Try to relax the muscles in those areas.  Download or buy apps on your mobile phone or tablet that can help you stick to your quit plan. There are many free apps, such as QuitGuide from the CDC (Centers for Disease Control and Prevention). You can find more support from smokefree.gov and other websites.  This information is not intended to replace advice given to you by your health care provider. Make sure you discuss any questions you have with your health care provider. Document Released: 02/04/2009 Document   Revised: 12/07/2015 Document Reviewed: 08/25/2014 Elsevier Interactive Patient Education  2018 [employer].  

## 2017-06-12 ENCOUNTER — Encounter: Payer: Self-pay | Admitting: Orthopaedic Surgery

## 2017-06-12 ENCOUNTER — Ambulatory Visit (INDEPENDENT_AMBULATORY_CARE_PROVIDER_SITE_OTHER): Payer: BLUE CROSS/BLUE SHIELD

## 2017-06-12 ENCOUNTER — Ambulatory Visit: Payer: BLUE CROSS/BLUE SHIELD | Admitting: Orthopaedic Surgery

## 2017-06-12 VITALS — BP 119/70 | HR 74 | Temp 97.8°F | Ht 66.0 in | Wt 234.0 lb

## 2017-06-12 DIAGNOSIS — G8929 Other chronic pain: Secondary | ICD-10-CM

## 2017-06-12 DIAGNOSIS — M25561 Pain in right knee: Secondary | ICD-10-CM | POA: Diagnosis not present

## 2017-06-12 NOTE — Progress Notes (Signed)
Patient GY:IRSW Michelle Patton, female DOB:1955/11/11, 62 y.o. NIO:[id number]  Chief Complaint  Patient presents with  . Knee Pain    right     HPI  Michelle Patton is a 62 y.o. female who fell in August of last year and hurt her left knee.  She had a fracture of her wrist.  She said her knee was doing OK at times.  She had swelling and popping.  She preferred to have no treatment on the knee at that time.  Since then the knee on the right has gotten worse.  It swells more, pops and now gives way. She has medial joint line pain.  She has no redness, no distal edema.  She has no new trauma.  Nothing seems to help it now.  The giving way and pain are really bothering her now. HPI  Body mass index is 37.77 kg/m.  ROS  Review of Systems  HENT: Negative for congestion.   Respiratory: Negative for cough and shortness of breath.   Cardiovascular: Negative for chest pain and leg swelling.  Endocrine: Negative for cold intolerance.  Musculoskeletal: Positive for arthralgias and joint swelling.  Allergic/Immunologic: Negative for environmental allergies.  Psychiatric/Behavioral: The patient is nervous/anxious.   All other systems reviewed and are negative.   Past Medical History:  Diagnosis Date  . Anxiety   . Aortic regurgitation 07/20/2014  . Aortic stenosis, severe 07/20/2014  . DVT (deep venous thrombosis) ([place])   . Family history of adverse reaction to anesthesia    Reports father deliurm in his 97's with CABG  . GERD (gastroesophageal reflux disease)   . History of pneumonia   . Hyperlipidemia   . Hypertension   . Insomnia, unspecified   . Muscle weakness (generalized)   . Peripheral artery disease ([place])   . 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 Sleep apnea    diagnosed multiple years ago at  [facility]  . Supravalvular aortic stenosis, congenital - s/p repair during childhood     Past Surgical History:  Procedure Laterality Date  . ABDOMINAL AORTAGRAM  06/24/12  . ABDOMINAL AORTAGRAM N/A 06/24/2012   Procedure: ABDOMINAL Maxcine Ham;  Surgeon: Angelia Mould, MD;  Location: [facility] CATH LAB;  Service: Cardiovascular;  Laterality: N/A;  . AORTIC VALVE REPLACEMENT N/A 10/07/2014   Procedure: REDO AORTIC VALVE REPLACEMENT (AVR);  Surgeon: Rexene Alberts, MD;  Location: [place];  Service: Open Heart Surgery;  Laterality: N/A;  . ASCENDING AORTIC ROOT REPLACEMENT N/A 10/07/2014   Procedure: ASCENDING AORTIC ROOT REPLACEMENT;  Surgeon: Rexene Alberts, MD;  Location: [place];  Service: Open Heart Surgery;  Laterality: N/A;  . BIOPSY  09/27/2016   Procedure: BIOPSY;  Surgeon: Daneil Dolin, MD;  [facility];  Service: Endoscopy;;  colon  . [place]  . CERVICAL FUSION    . CHOLECYSTECTOMY    . COLONOSCOPY WITH PROPOFOL N/A 09/27/2016   Procedure: COLONOSCOPY WITH PROPOFOL;  Surgeon: Daneil Dolin, MD;  [facility];  Service: Endoscopy;  Laterality: N/A;  1:00pm  . ESOPHAGOGASTRODUODENOSCOPY (EGD) WITH PROPOFOL N/A 09/27/2016   Procedure: ESOPHAGOGASTRODUODENOSCOPY (EGD) WITH PROPOFOL;  Surgeon: Daneil Dolin, MD;  [facility];  Service: Endoscopy;  Laterality: N/A;  . ILIAC ARTERY STENT Left 12/2007  . LEFT AND RIGHT  HEART CATHETERIZATION WITH CORONARY ANGIOGRAM N/A 07/31/2014   Procedure: LEFT AND RIGHT HEART CATHETERIZATION WITH CORONARY ANGIOGRAM;  Surgeon: Burnell Blanks, MD;  Location[facility] CATH LAB;  Service: Cardiovascular;  Laterality: N/A;  . Venia Minks DILATION N/A 09/27/2016   Procedure: Venia Minks DILATION;  Surgeon: Daneil Dolin, MD;  [facility];  Service: Endoscopy;  Laterality: N/A;  . POLYPECTOMY  09/27/2016   Procedure: POLYPECTOMY;  Surgeon: Daneil Dolin, MD;  [facility];  Service: Endoscopy;;   colon  . ROTATOR CUFF REPAIR Bilateral   . TEE WITHOUT CARDIOVERSION N/A 07/07/2014   Procedure: TRANSESOPHAGEAL ECHOCARDIOGRAM (TEE);  Surgeon: Arnoldo Lenis, MD;  [facility];  Service: Cardiology;  Laterality: N/A;  . TEE WITHOUT CARDIOVERSION N/A 07/20/2014   Procedure: TRANSESOPHAGEAL ECHOCARDIOGRAM (TEE) WITH PROPOFOL;  Surgeon: Arnoldo Lenis, MD;  [facility];  Service: Endoscopy;  Laterality: N/A;  . TEE WITHOUT CARDIOVERSION N/A 10/07/2014   Procedure: TRANSESOPHAGEAL ECHOCARDIOGRAM (TEE);  Surgeon: Rexene Alberts, MD;  Location: [place];  Service: Open Heart Surgery;  Laterality: N/A;    Family History  Problem Relation Age of Onset  . Hypertension Mother   . Hypertension Father   . Heart disease Father        before age 65  . Other Father        varicose veins  . COPD Father   . Colon cancer Neg Hx   . Celiac disease Neg Hx   . Inflammatory bowel disease Neg Hx     Social History Social History   Tobacco Use  . Smoking status: Former Smoker    Packs/day: 0.50    Years: 40.00    Pack years: 20.00    Types: Cigarettes    Last attempt to quit: 06/10/2017  . Smokeless tobacco: Never Used  Substance Use Topics  . Alcohol use: Yes    Alcohol/week: 2.4 oz    Types: 4 Cans of beer per week    Comment: 4 drinks per week  . Drug use: No    Allergies  Allergen Reactions  . Penicillins Hives    Has patient had a PCN reaction causing immediate rash, facial/tongue/throat swelling, SOB or lightheadedness with hypotension: Yes Has patient had a PCN reaction causing severe rash involving mucus membranes or skin necrosis: No Has patient had a PCN reaction that required hospitalization No Has patient had a PCN reaction occurring within the last 10 years: No If all of the above answers are "NO", then may proceed with Cephalosporin use.  Have taken Keflex before without any adverse reaction.   . Sulfa Antibiotics Nausea And Vomiting    Current  Outpatient Medications  Medication Sig Dispense Refill  . aspirin 81 MG tablet Take 81 mg by mouth daily.    . Calcium Carb-Cholecalciferol (CALCIUM 600+D3) 600-800 MG-UNIT TABS Take 1 tablet by mouth every morning.     . Cholecalciferol (VITAMIN D3) 5000 UNITS TABS Take 5,000 Units by mouth every morning.     . clonazePAM (KLONOPIN) 0.5 MG tablet Take 1 tablet (0.5 mg total) by mouth at bedtime. 30 tablet 5  . clonazePAM (KLONOPIN) 1 MG tablet     . Cranberry-Vitamin C-Vitamin E (CRANBERRY PLUS VITAMIN C) 4200-20-3 MG-MG-UNIT CAPS Take 1 capsule by mouth every morning.     [name] estradiol (ESTRACE) 1 MG tablet Take 1 mg by mouth every morning.     . furosemide (LASIX) 20 MG tablet   3  . Magnesium  250 MG TABS Take 250 mg by mouth every morning.     . medroxyPROGESTERone (PROVERA) 2.5 MG tablet Take 2.5 mg by mouth every morning.     . metoprolol tartrate (LOPRESSOR) 25 MG tablet TAKE 1 TABLET TWICE A DAY 180 tablet 3  . Multiple Vitamins-Minerals (MULTIVITAMIN WITH MINERALS) tablet Take 1 tablet by mouth every morning.     . Potassium 99 MG TABS Take 99 mg by mouth every morning. OTC    . RABEprazole (ACIPHEX) 20 MG tablet Take 20 mg by mouth daily.    . rosuvastatin (CRESTOR) 10 MG tablet     . zolpidem (AMBIEN) 10 MG tablet Take 10 mg by mouth at bedtime as needed for sleep.     . furosemide (LASIX) 20 MG tablet Take 1 tablet (20 mg total) by mouth daily as needed. 30 tablet 3   No current facility-administered medications for this visit.      Physical Exam  Blood pressure 119/70, pulse 74, temperature 97.8 F (36.6 C), height 5\' 6"  (1.676 m), weight 234 lb (106.1 kg).  Constitutional: overall normal hygiene, normal nutrition, well developed, normal grooming, normal body habitus. Assistive device:none  Musculoskeletal: gait and station Limp right, muscle tone and strength are normal, no tremors or atrophy is present.  .  Neurological: coordination overall normal.  Deep tendon  reflex/nerve stretch intact.  Sensation normal.  Cranial nerves II-XII intact.   Skin:   Normal overall no scars, lesions, ulcers or rashes. No psoriasis.  Psychiatric: Alert and oriented x 3.  Recent memory intact, remote memory unclear.  Normal mood and affect. Well groomed.  Good eye contact.  Cardiovascular: overall no swelling, no varicosities, no edema bilaterally, normal temperatures of the legs and arms, no clubbing, cyanosis and good capillary refill.  Lymphatic: palpation is normal.  The right lower extremity is examined:  Inspection:  Thigh:  Non-tender and no defects  Knee has swelling 1+ effusion.                        Joint tenderness is present                        Patient is tender over the medial joint line  Lower Leg:  Has normal appearance and no tenderness or defects  Ankle:  Non-tender and no defects  Foot:  Non-tender and no defects Range of Motion:  Knee:  Range of motion is: 0-105                        Crepitus is  present  Ankle:  Range of motion is normal. Strength and Tone:  The right lower extremity has normal strength and tone. Stability:  Knee:  The knee has positive medial McMurray.  Ankle:  The ankle is stable.   All other systems reviewed and are negative   The patient has been educated about the nature of the problem(s) and counseled on treatment options.  The patient appeared to understand what I have discussed and is in agreement with it.  Encounter Diagnosis  Name Primary?  . Chronic pain of right knee Yes   PROCEDURE NOTE:  The patient requests injections of the right knee , verbal consent was obtained.  The right knee was prepped appropriately after time out was performed.   Sterile technique was observed and injection of 1 cc of Depo-Medrol 40 mg with several cc's of plain  xylocaine. Anesthesia was provided by ethyl chloride and a 20-gauge needle was used to inject the knee area. The injection was tolerated well.  A band aid  dressing was applied.  The patient was advised to apply ice later today and tomorrow to the injection sight as needed.   PLAN Call if any problems.  Precautions discussed.  Continue current medications.   Return to clinic MRI of the right knee   Electronically Signed Sanjuana Kava, MD 2/19/20193:36 PM

## 2017-06-18 ENCOUNTER — Ambulatory Visit (HOSPITAL_COMMUNITY)
Admission: RE | Admit: 2017-06-18 | Discharge: 2017-06-18 | Disposition: A | Discharge status: Home / Self Care | Payer: BLUE CROSS/BLUE SHIELD | Source: Ambulatory Visit | Attending: Orthopaedic Surgery | Admitting: Orthopaedic Surgery

## 2017-06-18 DIAGNOSIS — M25561 Pain in right knee: Secondary | ICD-10-CM | POA: Insufficient documentation

## 2017-06-18 DIAGNOSIS — M25461 Effusion, right knee: Secondary | ICD-10-CM | POA: Diagnosis not present

## 2017-06-18 DIAGNOSIS — S83231A Complex tear of medial meniscus, current injury, right knee, initial encounter: Secondary | ICD-10-CM | POA: Diagnosis not present

## 2017-06-18 DIAGNOSIS — S8391XA Sprain of unspecified site of right knee, initial encounter: Secondary | ICD-10-CM | POA: Diagnosis not present

## 2017-06-18 DIAGNOSIS — M71561 Other bursitis, not elsewhere classified, right knee: Secondary | ICD-10-CM | POA: Diagnosis not present

## 2017-06-18 DIAGNOSIS — G8929 Other chronic pain: Secondary | ICD-10-CM | POA: Diagnosis present

## 2017-06-19 ENCOUNTER — Telehealth: Payer: Self-pay | Admitting: Radiology

## 2017-06-19 ENCOUNTER — Ambulatory Visit: Payer: BLUE CROSS/BLUE SHIELD | Admitting: Orthopaedic Surgery

## 2017-06-19 NOTE — Telephone Encounter (Signed)
Whats a while ?

## 2017-06-19 NOTE — Telephone Encounter (Signed)
March 27th

## 2017-06-19 NOTE — Telephone Encounter (Signed)
Dr Luna Glasgow wants you to evaluate patient for surgical consult, it is a while before you have anything available   She has : 1. Complex tear posterior horn medial meniscus with an oblique component extending from the periphery to the inferior surface, intersecting a vertical or radial tear closer to the free edge. 2. Grade 2 sprain or partial tearing of the proximal MCL. 3. Variable degree of chondral thinning although moderate to prominent in the medial compartment. 4. Small knee joint effusion with mild pes anserine bursitis.  Will you review the schedule and see if you can accommodate her sooner ?

## 2017-06-19 NOTE — Telephone Encounter (Signed)
Michelle Patton came into the office this morning for her appointment and the MRI report was not ready for [place] to view.  She was upset that she had taken off work to come in. I told her I would call her when the report came in.  The report came and Dr. Luna Glasgow reviewed it.  He told me to call her and read the impression to her and to tell her she will need an appointment with Dr. Aline Brochure or the orthopedic surgeon of her choice, to discuss surgery.  I called her and explained this.  She understood and opted to see Dr. Aline Brochure.  Angie made the Consultation appointment for her and she was notified of the date and time.

## 2017-06-19 NOTE — Telephone Encounter (Signed)
July 02, 1148

## 2017-07-01 NOTE — Progress Notes (Signed)
PREOP CONSULT/REFERRAL INTRA-OFFICE FROM DR Tyrone Apple   Chief Complaint  Patient presents with  . Knee Pain    surgical consult right knee    62 year old female presents for possible surgical treatment of a torn medial meniscus of the right knee  She is 62 years old she has some medical problems which are listed below she fell and injured her right knee in August she got an injection of cortisone did not improve and eventually went for an MRI which showed she had a torn medial meniscus a sprain of her MCL and chondral thinning of the medial compartment  She complains of moderate to mild right medial knee pain associated with clicking and swelling.    Review of Systems  Constitutional: Negative.   Respiratory: Negative.   Cardiovascular: Negative.      Past Medical History:  Diagnosis Date  . Anxiety   . Aortic regurgitation 07/20/2014  . Aortic stenosis, severe 07/20/2014  . DVT (deep venous thrombosis) ([place])   . Family history of adverse reaction to anesthesia    Reports father deliurm in his 61's with CABG  . GERD (gastroesophageal reflux disease)   . History of pneumonia   . Hyperlipidemia   . Hypertension   . Insomnia, unspecified   . Muscle weakness (generalized)   . Peripheral artery disease ([place])   . 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 Sleep apnea    diagnosed multiple years ago at [facility]  . Supravalvular aortic stenosis, congenital - s/p repair during childhood     Past Surgical History:  Procedure Laterality Date  . ABDOMINAL AORTAGRAM  06/24/12  . ABDOMINAL AORTAGRAM N/A 06/24/2012   Procedure: ABDOMINAL Maxcine Ham;  Surgeon: Angelia Mould, MD;  Location[facility] CATH LAB;  Service: Cardiovascular;  Laterality: N/A;  . AORTIC VALVE REPLACEMENT N/A 10/07/2014   Procedure: REDO  AORTIC VALVE REPLACEMENT (AVR);  Surgeon: Rexene Alberts, MD;  Location: [place];  Service: Open Heart Surgery;  Laterality: N/A;  . ASCENDING AORTIC ROOT REPLACEMENT N/A 10/07/2014   Procedure: ASCENDING AORTIC ROOT REPLACEMENT;  Surgeon: Rexene Alberts, MD;  Location: [place];  Service: Open Heart Surgery;  Laterality: N/A;  . BIOPSY  09/27/2016   Procedure: BIOPSY;  Surgeon: Daneil Dolin, MD;  [facility];  Service: Endoscopy;;  colon  . [place]  . CERVICAL FUSION    . CHOLECYSTECTOMY    . COLONOSCOPY WITH PROPOFOL N/A 09/27/2016   Procedure: COLONOSCOPY WITH PROPOFOL;  Surgeon: Daneil Dolin, MD;  [facility];  Service: Endoscopy;  Laterality: N/A;  1:00pm  . ESOPHAGOGASTRODUODENOSCOPY (EGD) WITH PROPOFOL N/A 09/27/2016   Procedure: ESOPHAGOGASTRODUODENOSCOPY (EGD) WITH PROPOFOL;  Surgeon: Daneil Dolin, MD;  [facility];  Service: Endoscopy;  Laterality: N/A;  . ILIAC ARTERY STENT Left 12/2007  . LEFT AND RIGHT HEART CATHETERIZATION WITH CORONARY ANGIOGRAM N/A 07/31/2014   Procedure: LEFT AND RIGHT HEART CATHETERIZATION WITH CORONARY ANGIOGRAM;  Surgeon: Burnell Blanks, MD;  Location: [facility] CATH LAB;  Service: Cardiovascular;  Laterality: N/A;  . Venia Minks DILATION N/A 09/27/2016   Procedure: Venia Minks DILATION;  Surgeon: Daneil Dolin, MD;  [facility];  Service: Endoscopy;  Laterality: N/A;  . POLYPECTOMY  09/27/2016   Procedure: POLYPECTOMY;  Surgeon: Daneil Dolin, MD;  [facility];  Service: Endoscopy;;  colon  . ROTATOR CUFF REPAIR Bilateral   . TEE WITHOUT CARDIOVERSION N/A 07/07/2014   Procedure: TRANSESOPHAGEAL ECHOCARDIOGRAM (TEE);  Surgeon: Arnoldo Lenis, MD;  [facility];  Service: Cardiology;  Laterality: N/A;  . TEE WITHOUT CARDIOVERSION N/A 07/20/2014   Procedure: TRANSESOPHAGEAL ECHOCARDIOGRAM (TEE) WITH PROPOFOL;  Surgeon: Arnoldo Lenis, MD;  [facility];  Service:  Endoscopy;  Laterality: N/A;  . TEE WITHOUT CARDIOVERSION N/A 10/07/2014   Procedure: TRANSESOPHAGEAL ECHOCARDIOGRAM (TEE);  Surgeon: Rexene Alberts, MD;  Location: [place];  Service: Open Heart Surgery;  Laterality: N/A;    Family History  Problem Relation Age of Onset  . Hypertension Mother   . Hypertension Father   . Heart disease Father        before age 29  . Other Father        varicose veins  . COPD Father   . Colon cancer Neg Hx   . Celiac disease Neg Hx   . Inflammatory bowel disease Neg Hx    Social History   Tobacco Use  . Smoking status: Former Smoker    Packs/day: 0.50    Years: 40.00    Pack years: 20.00    Types: Cigarettes    Last attempt to quit: 06/10/2017    Years since quitting: 0.0  . Smokeless tobacco: Never Used  Substance Use Topics  . Alcohol use: Yes    Alcohol/week: 2.4 oz    Types: 4 Cans of beer per week    Comment: 4 drinks per week  . Drug use: No   Allergies  Allergen Reactions  . Penicillins Hives    Has patient had a PCN reaction causing immediate rash, facial/tongue/throat swelling, SOB or lightheadedness with hypotension: Yes Has patient had a PCN reaction causing severe rash involving mucus membranes or skin necrosis: No Has patient had a PCN reaction that required hospitalization No Has patient had a PCN reaction occurring within the last 10 years: No If all of the above answers are "NO", then may proceed with Cephalosporin use.  Have taken Keflex before without any adverse reaction.   . Sulfa Antibiotics Nausea And Vomiting     No outpatient medications have been marked as taking for the 07/02/17 encounter (Appointment) with Carole Civil, MD.    BP 123/73   Pulse 73   Ht 5\' 6"  (1.676 m)   Wt 230 lb (104.3 kg)   BMI 37.12 kg/m   Physical Exam  Constitutional: She is oriented to person, place, and time. She appears well-developed and well-nourished.  Neurological: She is alert and oriented to person, place, and  time. Gait normal.  Psychiatric: She has a normal mood and affect. Judgment normal.  Vitals reviewed.   Right Knee Exam   Muscle Strength  The patient has normal right knee strength.  Tenderness  The patient is experiencing tenderness in the medial hamstring and medial joint line.  Range of Motion  Extension: normal  Flexion: normal   Tests  McMurray:  Medial - positive Lateral - negative Varus: negative Valgus: negative Drawer:  Anterior - negative    Posterior - negative  Other  Erythema: absent Scars: absent Sensation: normal Pulse: present Swelling: none   Left Knee Exam   Muscle Strength  The patient has normal left knee strength.  Tenderness  The patient is experiencing tenderness in the medial hamstring.  Range of Motion  Extension: normal  Flexion: normal  Tests  McMurray:  Medial - negative Lateral - negative Varus: negative Valgus: negative Drawer:  Anterior - negative     Posterior - negative  Other  Erythema: absent Scars: absent Sensation: normal Pulse: present Swelling: none      MEDICAL DECISION SECTION  xrays ordered? No   My independent reading of xrays: mri torn medial meniscus and osteoarthritis medial compartment  Xray: mild oa right knee    Encounter Diagnoses  Name Primary?  . Derangement of posterior horn of medial meniscus of right knee Yes  . Pes anserinus bursitis of right knee   . Primary osteoarthritis of right knee     MRI report:   IMPRESSION: 1. Complex tear posterior horn medial meniscus with an oblique component extending from the periphery to the inferior surface, intersecting a vertical or radial tear closer to the free edge. 2. Grade 2 sprain or partial tearing of the proximal MCL. 3. Variable degree of chondral thinning although moderate to prominent in the medial compartment. 4. Small knee joint effusion with mild pes anserine bursitis.     Electronically Signed   By: Van Clines M.D.    On: 06/19/2017 08:45     PLAN:  The patient is in agreement to try further nonoperative treatment which will include knee exercises for 4 weeks topical medications and a knee sleeve and we also decided to inject her bursitis on the medial side   Verbal consent was obtained for right knee injection Timeout for confirmation of injection site Injection right knee for bursitis.  Medial proximal tibia point of maximal tenderness injected with 3 cc of 1% lidocaine and 40 mg of Depo-Medrol

## 2017-07-02 ENCOUNTER — Ambulatory Visit: Payer: BLUE CROSS/BLUE SHIELD | Admitting: Orthopedic Surgery

## 2017-07-02 ENCOUNTER — Encounter: Payer: Self-pay | Admitting: Orthopedic Surgery

## 2017-07-02 VITALS — BP 123/73 | HR 73 | Ht 66.0 in | Wt 230.0 lb

## 2017-07-02 DIAGNOSIS — M1711 Unilateral primary osteoarthritis, right knee: Secondary | ICD-10-CM | POA: Diagnosis not present

## 2017-07-02 DIAGNOSIS — M23321 Other meniscus derangements, posterior horn of medial meniscus, right knee: Secondary | ICD-10-CM

## 2017-07-02 DIAGNOSIS — M7051 Other bursitis of knee, right knee: Secondary | ICD-10-CM

## 2017-07-02 NOTE — Patient Instructions (Signed)
You have received an injection of steroids into the joint. 15% of patients will have increased pain within the 24 hours postinjection.   This is transient and will go away.   We recommend that you use ice packs on the injection site for 20 minutes every 2 hours and extra strength Tylenol 2 tablets every 8 as needed until the pain resolves.  If you continue to have pain after taking the Tylenol and using the ice please call the office for further instructions.  These are the muscle and arthrits creams I recommend:  PLEASE READ THE PACKAGE INSTRUCTIONS BEFORE USING   Ben Gay arthritis cream  Icy hot vanishing gel  Aspercreme odor free  Myoflex Oderless pain reliever  Capzasin  Sportscreme  Max freeze

## 2017-07-30 ENCOUNTER — Ambulatory Visit: Payer: BLUE CROSS/BLUE SHIELD | Admitting: Orthopedic Surgery

## 2017-07-30 ENCOUNTER — Ambulatory Visit (INDEPENDENT_AMBULATORY_CARE_PROVIDER_SITE_OTHER): Payer: BLUE CROSS/BLUE SHIELD

## 2017-07-30 ENCOUNTER — Encounter: Payer: Self-pay | Admitting: Orthopedic Surgery

## 2017-07-30 VITALS — BP 128/79 | HR 71 | Ht 66.0 in | Wt 233.0 lb

## 2017-07-30 DIAGNOSIS — M25562 Pain in left knee: Secondary | ICD-10-CM

## 2017-07-30 DIAGNOSIS — S83242A Other tear of medial meniscus, current injury, left knee, initial encounter: Secondary | ICD-10-CM

## 2017-07-30 DIAGNOSIS — M23321 Other meniscus derangements, posterior horn of medial meniscus, right knee: Secondary | ICD-10-CM

## 2017-07-30 DIAGNOSIS — M7051 Other bursitis of knee, right knee: Secondary | ICD-10-CM | POA: Diagnosis not present

## 2017-07-30 NOTE — Progress Notes (Signed)
Progress Note   Patient ID: Michelle Patton, female   DOB: 02/28/1956, 62 y.o.   MRN: [id number]  Chief Complaint  Patient presents with  . Knee Pain    right, recheck, with new onset left knee pain    62 year old female came back after injection for bursitis right knee says did not help a lot still sore  Comes in with 1-1/2-week history of sharp aching pain medial aspect left knee with locking and giving way  History of left knee arthroscopy many years ago    Review of Systems  Constitutional: Negative for chills and fever.  Musculoskeletal: Positive for back pain.  Skin: Negative.    Current Meds  Medication Sig  . aspirin 81 MG tablet Take 81 mg by mouth daily.  . Calcium Carb-Cholecalciferol (CALCIUM 600+D3) 600-800 MG-UNIT TABS Take 1 tablet by mouth every morning.   . Cholecalciferol (VITAMIN D3) 5000 UNITS TABS Take 5,000 Units by mouth every morning.   . clonazePAM (KLONOPIN) 1 MG tablet   . Cranberry-Vitamin C-Vitamin E (CRANBERRY PLUS VITAMIN C) 4200-20-3 MG-MG-UNIT CAPS Take 1 capsule by mouth every morning.   [name] estradiol (ESTRACE) 1 MG tablet Take 1 mg by mouth every morning.   . furosemide (LASIX) 20 MG tablet   . Magnesium 250 MG TABS Take 250 mg by mouth every morning.   . medroxyPROGESTERone (PROVERA) 2.5 MG tablet Take 2.5 mg by mouth every morning.   . metoprolol tartrate (LOPRESSOR) 25 MG tablet TAKE 1 TABLET TWICE A DAY  . Multiple Vitamins-Minerals (MULTIVITAMIN WITH MINERALS) tablet Take 1 tablet by mouth every morning.   . Potassium 99 MG TABS Take 99 mg by mouth every morning. OTC  . RABEprazole (ACIPHEX) 20 MG tablet Take 20 mg by mouth daily.  . rosuvastatin (CRESTOR) 10 MG tablet   . zolpidem (AMBIEN) 10 MG tablet Take 10 mg by mouth at bedtime as needed for sleep.     Past Medical History:  Diagnosis Date  . Anxiety   . Aortic regurgitation 07/20/2014  . Aortic stenosis, severe 07/20/2014  . DVT (deep venous thrombosis) ([place])   . Family  history of adverse reaction to anesthesia    Reports father deliurm in his 29's with CABG  . GERD (gastroesophageal reflux disease)   . History of pneumonia   . Hyperlipidemia   . Hypertension   . Insomnia, unspecified   . Muscle weakness (generalized)   . Peripheral artery disease ([place])   . 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 Sleep apnea    diagnosed multiple years [facility]  . Supravalvular aortic stenosis, congenital - s/p repair during childhood      Allergies  Allergen Reactions  . Penicillins Hives    Has patient had a PCN reaction causing immediate rash, facial/tongue/throat swelling, SOB or lightheadedness with hypotension: Yes Has patient had a PCN reaction causing severe rash involving mucus membranes or skin necrosis: No Has patient had a PCN reaction that required hospitalization No Has patient had a PCN reaction occurring within the last 10 years: No If all of the above answers are "NO", then may proceed with Cephalosporin use.  Have taken Keflex before without any adverse reaction.   . Sulfa Antibiotics Nausea And Vomiting    BP 128/79   Pulse 71   Ht 5\' 6"  (1.676  m)   Wt 233 lb (105.7 kg)   BMI 37.61 kg/m    Physical Exam  Constitutional: She is oriented to person, place, and time. She appears well-developed and well-nourished.  Neurological: She is alert and oriented to person, place, and time.  Psychiatric: She has a normal mood and affect. Judgment normal.  Vitals reviewed.   Ortho Exam   left knee gait does not seem to be affected.  She has medial joint line tenderness.  I do not detect effusion knee feels stable.  Strength and muscle tone are normal skin is intact no peripheral edema normal sensation  Complains of pain medial joint line with McMurray's  maneuver   Right knee bruise from where the bursal injection was performed Medical decision-making  Imaging: X-ray does not really look that bad she has a small amount of joint space narrowing without secondary bone changes please see the dictated report of the left knee   Encounter Diagnoses  Name Primary?  . Acute pain of left knee Yes  . Acute medial meniscus tear of left knee, initial encounter   . Derangement of posterior horn of medial meniscus of right knee   . Pes anserinus bursitis of right knee    MRI left knee possible medial meniscus tear.  Follow-up after MRI   No orders of the defined types were placed in this encounter.    Arther Abbott, MD 07/30/2017 9:50 AM

## 2017-07-31 ENCOUNTER — Telehealth: Payer: Self-pay | Admitting: Radiology

## 2017-07-31 NOTE — Telephone Encounter (Signed)
I have called to schedule MRI at [facility] Thursday April 11th at 6pm I have called patient to advise no answer no voicemail  Also need to find her a follow up appointment.

## 2017-08-01 NOTE — Telephone Encounter (Signed)
Spoke to patient to advise

## 2017-08-02 ENCOUNTER — Ambulatory Visit (HOSPITAL_COMMUNITY)
Admission: RE | Admit: 2017-08-02 | Discharge: 2017-08-02 | Disposition: A | Discharge status: Home / Self Care | Payer: BLUE CROSS/BLUE SHIELD | Source: Ambulatory Visit | Attending: Orthopedic Surgery | Admitting: Orthopedic Surgery

## 2017-08-02 DIAGNOSIS — M25562 Pain in left knee: Secondary | ICD-10-CM | POA: Diagnosis present

## 2017-08-02 DIAGNOSIS — I868 Varicose veins of other specified sites: Secondary | ICD-10-CM | POA: Diagnosis not present

## 2017-08-02 DIAGNOSIS — M67462 Ganglion, left knee: Secondary | ICD-10-CM | POA: Insufficient documentation

## 2017-08-02 DIAGNOSIS — M899 Disorder of bone, unspecified: Secondary | ICD-10-CM | POA: Insufficient documentation

## 2017-08-02 DIAGNOSIS — M7642 Tibial collateral bursitis [Pellegrini-Stieda], left leg: Secondary | ICD-10-CM | POA: Diagnosis not present

## 2017-08-02 DIAGNOSIS — M25461 Effusion, right knee: Secondary | ICD-10-CM | POA: Insufficient documentation

## 2017-08-02 DIAGNOSIS — S83242A Other tear of medial meniscus, current injury, left knee, initial encounter: Secondary | ICD-10-CM

## 2017-08-20 ENCOUNTER — Ambulatory Visit: Payer: BLUE CROSS/BLUE SHIELD | Admitting: Orthopedic Surgery

## 2017-08-20 ENCOUNTER — Encounter: Payer: Self-pay | Admitting: Orthopedic Surgery

## 2017-08-20 VITALS — BP 146/83 | HR 80 | Ht 66.0 in | Wt 233.0 lb

## 2017-08-20 DIAGNOSIS — M1712 Unilateral primary osteoarthritis, left knee: Secondary | ICD-10-CM | POA: Diagnosis not present

## 2017-08-20 DIAGNOSIS — M7052 Other bursitis of knee, left knee: Secondary | ICD-10-CM

## 2017-08-20 MED ORDER — DICLOFENAC SODIUM 75 MG PO TBEC: 75.0000 mg | DELAYED_RELEASE_TABLET | Freq: Two times a day (BID) | ORAL | 2 refills | Status: DC

## 2017-08-20 NOTE — Progress Notes (Signed)
FOLLOW UP VISIT : MRI RESULTS   Chief Complaint  Patient presents with  . Knee Pain    left  . Results    review MRI      HPI: The patient is here TO DISCUSS THE RESULTS OF MRI  62 year old female with left knee pain the pain is not any better  She injured her wrist last year was treated in a cast initially THEN a brace for 4 to 6 weeks,  complains of dull aching pain over the ulnar side of the wrist.  This is worse when she rides a motorcycle and is associated with intermittent swelling.  X-ray was read about 2 weeks after the injury as Clinical:  History of left wrist injury  X-rays were done of the left wrist, three views.  There is a nondisplaced intra-articular fracture of the left distal radius.  Bone quality is good.  Impression:  Nondisplaced intra-articular fracture of the left distal radius.  Electronically Signed Sanjuana Kava, MD 9/19/20183:06 PM  I reviewed the x-ray and agree that there is a fracture of the radial styloid count of a chauffeur's type fracture with intra-articular extension TFCC looks normal   On the prior visit: Chief Complaint  Patient presents with  . Knee Pain    right, recheck, with new onset left knee pain    62 year old female came back after injection for bursitis right knee says did not help a lot still sore  Comes in with 1-1/2-week history of sharp aching pain medial aspect left knee with locking and giving way  History of left knee arthroscopy many years ago  Review of Systems  Musculoskeletal: Positive for back pain and joint pain.  Neurological: Negative for tingling and sensory change.    Past Medical History:  Diagnosis Date  . Anxiety   . Aortic regurgitation 07/20/2014  . Aortic stenosis, severe 07/20/2014  . DVT (deep venous thrombosis) ([place])   . Family history of adverse reaction to anesthesia    Reports father deliurm in his 52's with CABG  . GERD (gastroesophageal reflux disease)   . History of pneumonia    . Hyperlipidemia   . Hypertension   . Insomnia, unspecified   . Muscle weakness (generalized)   . Peripheral artery disease ([place])   . 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 Sleep apnea    diagn[facility] ago at [facility]  . Supravalvular aortic stenosis, congenital - s/p repair during childhood     BP (!) 146/83   Pulse 80   Ht 5\' 6"  (1.676 m)   Wt 233 lb (105.7 kg)   BMI 37.61 kg/m  Physical Exam  Constitutional: She is oriented to person, place, and time. She appears well-developed and well-nourished.  Neurological: She is alert and oriented to person, place, and time.  Psychiatric: She has a normal mood and affect. Judgment normal.  Vitals reviewed.  Left wrist.  Tenderness over the TFCC area with no swelling.  Pain is volar and dorsal.  A range of motion flexion extension rotation normal.  No pain.  Does not appear unstable.  Grip strength normal.  Skin normal.  Pulse temperature normal.  Lymph nodes negative in the epitrochlear region.  Sensation normal.  The opposite wrist right wrist normal range of motion no swelling no tenderness    Medical decision-making section  DATA  MRI REPORT: Left knee IMPRESSION: 1. Semimembranosus-tibial collateral ligament bursitis with adjacent pes anserine bursitis. This is questionably continuous inferiorly with a cystic lesion which dissects along the anterior MCL, an which could represent MCL bursitis or a ganglion cyst. 2. Separate ganglion cyst with internal septations and some irregularity tracking longitudinally in the medial head gastrocnemius tendon over about 7.5 cm of the length of the tendon. Mild to moderate degrees of degenerative chondral thinning with some focal more severe lesions, notably along the medial patellar facet and  adjacent posterior patellar ridge. 3. Trace knee effusion. 4. Medial subcutaneous venous varicosity along the knee.   MY READING: MRI OF THE left knee.  Bursitis is noted in the Pez anserine tendons with possible ganglion cyst mild degenerative changes throughout the knee small joint effusion, no meniscal tear is seen.  Meds ordered this encounter  Medications  . diclofenac (VOLTAREN) 75 MG EC tablet    Sig: Take 1 tablet (75 mg total) by mouth 2 (two) times daily.    Dispense:  60 tablet    Refill:  2   So at this point were going to place her back in the brace for 6 weeks for the TFCC put her on diclofenac for the knee and the wrist  When she comes back if the wrist is still bothering her then I am going to recommend an MRI arthrogram for TFCC tear

## 2017-09-06 ENCOUNTER — Telehealth: Payer: Self-pay | Admitting: Cardiology

## 2017-09-06 DIAGNOSIS — R0602 Shortness of breath: Secondary | ICD-10-CM

## 2017-09-06 NOTE — Telephone Encounter (Signed)
Patient states she was to have echo done and then f/u with Dr.Branch. There is no echo order. Will you verify that this is correct and if so place the order. / tg

## 2017-09-06 NOTE — Telephone Encounter (Signed)
Order needed to be released, apt to be scheduled

## 2017-09-19 ENCOUNTER — Encounter: Payer: Self-pay | Admitting: Cardiology

## 2017-10-03 ENCOUNTER — Ambulatory Visit: Payer: BLUE CROSS/BLUE SHIELD | Admitting: Orthopedic Surgery

## 2017-10-04 ENCOUNTER — Ambulatory Visit: Payer: BLUE CROSS/BLUE SHIELD | Admitting: Cardiology

## 2017-10-04 ENCOUNTER — Encounter: Payer: Self-pay | Admitting: Cardiology

## 2017-10-04 VITALS — BP 110/70 | HR 72 | Ht 66.5 in | Wt 233.2 lb

## 2017-10-04 DIAGNOSIS — R0602 Shortness of breath: Secondary | ICD-10-CM | POA: Diagnosis not present

## 2017-10-04 DIAGNOSIS — I350 Nonrheumatic aortic (valve) stenosis: Secondary | ICD-10-CM

## 2017-10-04 DIAGNOSIS — I10 Essential (primary) hypertension: Secondary | ICD-10-CM

## 2017-10-04 DIAGNOSIS — I739 Peripheral vascular disease, unspecified: Secondary | ICD-10-CM

## 2017-10-04 MED ORDER — FUROSEMIDE 40 MG PO TABS: 40.0000 mg | ORAL_TABLET | Freq: Every day | ORAL | 3 refills | Status: DC | PRN

## 2017-10-04 NOTE — Progress Notes (Signed)
Clinical Summary Ms. Fleissner is a 62 y.o.female seen today for follow up of the following medical problems.   1. Aortic stenosis - hx in 1968 at age of 86 of supravavlular aortic stenosis, corrective surgery with patching at that time.  - recently found to have developed severe aortic valvular stenosis with possible recurrence of supravalvular stenosis as well based on imaging. 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  - Medtronic Freestyle stentless porcine valve/aortic root graft (size 21 mm, model # 995, serial # [id number])   - was to have echo after last visit due to fatigue and SOB in 11/2016, was not completed. Still with SOB a times,worsening LE edema, generalized fatigue.    2. PAD - followed by vascular, history of left external iliac stent.  - no recent symptoms.    3. HTN - she is compliant iwht meds  - cough on ACE-I, discontinued   4. HL - leg pains on lipitor - she is diet control at this time.  .   5. Chest pain - sharp pain under left breast. Can occur at rest or with exertion.  - usually comes on with stress. +SOB, not positional . Constant up 1.5 hours.     SH: Her husband Raschie Finstad is also a patient of mine.   Trip to [place] to visit her mother. Born and raised in [place] to [place]. Father passed in February.     Past Medical History:  Diagnosis Date  . Anxiety   . Aortic regurgitation 07/20/2014  . Aortic stenosis, severe 07/20/2014  . DVT (deep venous thrombosis) ([place])   . Family history of adverse reaction to anesthesia    Reports father deliurm in his 67's with CABG  . GERD (gastroesophageal reflux disease)   . History of pneumonia   . Hyperlipidemia   . Hypertension   . Insomnia, unspecified   . Muscle weakness (generalized)   . Peripheral artery disease ([place])   . 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 Sleep apnea    diagnosed multiple years ago at [facility]  . Supravalvular aortic stenosis, congenital - s/p repair during childhood      Allergies  Allergen Reactions  . Penicillins Hives    Has patient had a PCN reaction causing immediate rash, facial/tongue/throat swelling, SOB or lightheadedness with hypotension: Yes Has patient had a PCN reaction causing severe rash involving mucus membranes or skin necrosis: No Has patient had a PCN reaction that required hospitalization No Has patient had a PCN reaction occurring within the last 10 years: No If all of the above answers are "NO", then may proceed with Cephalosporin use.  Have taken Keflex before without any adverse reaction.   . Sulfa Antibiotics Nausea And Vomiting     Current Outpatient Medications  Medication Sig Dispense Refill  . aspirin 81 MG tablet Take 81 mg by mouth daily.    . Calcium Carb-Cholecalciferol (CALCIUM 600+D3) 600-800 MG-UNIT TABS Take 1 tablet by mouth every morning.     . Cholecalciferol (VITAMIN D3) 5000 UNITS TABS Take 5,000 Units by mouth every morning.     . clonazePAM (KLONOPIN) 1 MG tablet     . Cranberry-Vitamin C-Vitamin E (CRANBERRY PLUS VITAMIN C)  4200-20-3 MG-MG-UNIT CAPS Take 1 capsule by mouth every morning.     . diclofenac (VOLTAREN) 75 MG EC tablet Take 1 tablet (75 mg total) by mouth 2 (two) times daily. 60 tablet 2  . estradiol (ESTRACE) 1 MG tablet Take 1 mg by mouth every morning.     . furosemide (LASIX) 20 MG tablet   3  . Magnesium 250 MG TABS Take 250 mg by mouth every morning.     . medroxyPROGESTERone (PROVERA) 2.5 MG tablet Take 2.5 mg by mouth every morning.     . metoprolol tartrate (LOPRESSOR) 25 MG tablet TAKE 1 TABLET TWICE A DAY 180 tablet 3  . Multiple Vitamins-Minerals (MULTIVITAMIN WITH  MINERALS) tablet Take 1 tablet by mouth every morning.     . Potassium 99 MG TABS Take 99 mg by mouth every morning. OTC    . RABEprazole (ACIPHEX) 20 MG tablet Take 20 mg by mouth daily.    . rosuvastatin (CRESTOR) 10 MG tablet     . zolpidem (AMBIEN) 10 MG tablet Take 10 mg by mouth at bedtime as needed for sleep.      No current facility-administered medications for this visit.      Past Surgical History:  Procedure Laterality Date  . ABDOMINAL AORTAGRAM  06/24/12  . ABDOMINAL AORTAGRAM N/A 06/24/2012   Procedure: ABDOMINAL Maxcine Ham;  Surgeon: Angelia Mould, MD;  Location: [facility] C[facility] LAB;  Service: Cardiovascular;  Laterality: N/A;  . AORTIC VALVE REPLACEMENT N/A 10/07/2014   Procedure: REDO AORTIC VALVE REPLACEMENT (AVR);  Surgeon: Rexene Alberts, MD;  Location: [facility];  Service: Open Heart Surgery;  Laterality: N/A;  . ASCENDING AORTIC ROOT REPLACEMENT N/A 10/07/2014   Procedure: ASCENDING AORTIC ROOT REPLACEMENT;  Surgeon: Rexene Alberts, MD;  Location: [facility];  Service: Open Heart Surgery;  Laterality: N/A;  . BIOPSY  09/27/2016   Procedure: BIOPSY;  Surgeon: Daneil Dolin, MD;  Location: [facility] END[facility];  Service: Endoscopy;;  colon  . [place]  . CERVICAL FUSION    . CHOLECYSTECTOMY    . COLONOSCOPY WITH PROPOFOL N/A 09/27/2016   Procedure: COLONOSCOPY WITH PROPOFOL;  Surgeon: Daneil Dolin, MD;  Location[facility];  Service: Endoscopy;  Laterality: N/A;  1:00pm  . ESOPHAGOGASTRODUODENOSCOPY (EGD) WITH PROPOFOL N/A 09/27/2016   Procedure: ESOPHAGOGASTRODUODENOSCOPY (EGD) WITH PROPOFOL;  Surgeon: Daneil Dolin, MD;  [facility];  Service: Endoscopy;  Laterality: N/A;  . ILIAC ARTERY STENT Left 12/2007  . LEFT AND RIGHT HEART CATHETERIZATION WITH CORONARY ANGIOGRAM N/A 07/31/2014   Procedure: LEFT AND RIGHT HEART CATHETERIZATION WITH CORONARY ANGIOGRAM;  Surgeon: Burnell Blanks, MD;  Location[facility] C[facility] LAB;  Service: Cardiovascular;   Laterality: N/A;  . Venia Minks DILATION N/A 09/27/2016   Procedure: Venia Minks DILATION;  Surgeon: Daneil Dolin, MD;  [facility];  Service: Endoscopy;  Laterality: N/A;  . POLYPECTOMY  09/27/2016   Procedure: POLYPECTOMY;  Surgeon: Daneil Dolin, MD;  [facility];  Service: Endoscopy;;  colon  . ROTATOR CUFF REPAIR Bilateral   . TEE WITHOUT CARDIOVERSION N/A 07/07/2014   Procedure: TRANSESOPHAGEAL ECHOCARDIOGRAM (TEE);  Surgeon: Arnoldo Lenis, MD;  [facility];  Service: Cardiology;  Laterality: N/A;  . TEE WITHOUT CARDIOVERSION N/A 07/20/2014   Procedure: TRANSESOPHAGEAL ECHOCARDIOGRAM (TEE) WITH PROPOFOL;  Surgeon: Arnoldo Lenis, MD;  Location: [facility];  Service: Endoscopy;  Laterality: N/A;  . TEE WITHOUT CARDIOVERSION N/A 10/07/2014  Procedure: TRANSESOPHAGEAL ECHOCARDIOGRAM (TEE);  Surgeon: Rexene Alberts, MD;  Location: [place] [place];  Service: Open Heart Surgery;  Laterality: N/A;     Allergies  Allergen Reactions  . Penicillins Hives    Has patient had a PCN reaction causing immediate rash, facial/tongue/throat swelling, SOB or lightheadedness with hypotension: Yes Has patient had a PCN reaction causing severe rash involving mucus membranes or skin necrosis: No Has patient had a PCN reaction that required hospitalization No Has patient had a PCN reaction occurring within the last 10 years: No If all of the above answers are "NO", then may proceed with Cephalosporin use.  Have taken Keflex before without any adverse reaction.   . Sulfa Antibiotics Nausea And Vomiting      Family History  Problem Relation Age of Onset  . Hypertension Mother   . Hypertension Father   . Heart disease Father        before age 37  . Other Father        varicose veins  . COPD Father   . Colon cancer Neg Hx   . Celiac disease Neg Hx   . Inflammatory bowel disease Neg Hx      Social History Ms. Hagberg reports that she quit smoking about 3 months ago. Her  smoking use included cigarettes. She has a 20.00 pack-year smoking history. She has never used smokeless tobacco. Ms. Schlagel reports that she drinks about 2.4 oz of alcohol per week.   Review of Systems CONSTITUTIONAL: No weight loss, fever, chills, weakness or fatigue.  HEENT: Eyes: No visual loss, blurred vision, double vision or yellow sclerae.No hearing loss, sneezing, congestion, runny nose or sore throat.  SKIN: No rash or itching.  CARDIOVASCULAR: per hpi RESPIRATORY: per hpi GASTROINTESTINAL: No anorexia, nausea, vomiting or diarrhea. No abdominal pain or blood.  GENITOURINARY: No burning on urination, no polyuria NEUROLOGICAL: No headache, dizziness, syncope, paralysis, ataxia, numbness or tingling in the extremities. No change in bowel or bladder control.  MUSCULOSKELETAL: No muscle, back pain, joint pain or stiffness.  LYMPHATICS: No enlarged nodes. No history of splenectomy.  PSYCHIATRIC: No history of depression or anxiety.  ENDOCRINOLOGIC: No reports of sweating, cold or heat intolerance. No polyuria or polydipsia.  [name]   Physical Examination Vitals:   10/04/17 1340  BP: 110/70  Pulse: 72  SpO2: 98%   Vitals:   10/04/17 1340  Weight: 233 lb 3.2 oz (105.8 kg)  Height: 5' 6.5" (1.689 m)    Gen: resting comfortably, no acute distress HEENT: no scleral icterus, pupils equal round and reactive, no palptable cervical adenopathy,  CV: RRR, no m//rg, no jvd Resp: Clear to auscultation bilaterally GI: abdomen is soft, non-tender, non-distended, normal bowel sounds, no hepatosplenomegaly MSK: extremities are warm, 1+ bilateral LE edema Skin: warm, no rash Neuro:  no focal deficits Psych: appropriate affect   Diagnostic Studies  11/2015 echo Study Conclusions  - Left ventricle: The cavity size was normal. Wall thickness was normal. Systolic function was vigorous. The estimated ejection fraction was in the range of 65% to 70%. Wall motion was normal; there were  no regional wall motion abnormalities. Doppler parameters are consistent with abnormal left ventricular relaxation (grade 1 diastolic dysfunction). - Aortic valve: 21 mm Medtronic Freestyle stentless porcine valve/aortic root graft in place. There was no significant regurgitation. Peak gradient (S): 19 mm Hg. Valve area (Vmax): 1.83 cm^2. - Mitral valve: Calcified annulus. There was trivial regurgitation. - Right ventricle: The cavity size was mildly dilated. - Right  atrium: Central venous pressure (est): 3 mm Hg. - Atrial septum: The septum bowed from right to left, consistent with increased right atrial pressure. - Tricuspid valve: There was mild regurgitation. - Pulmonic valve: Peak gradient (S): 19 mm Hg. - Pulmonary arteries: PA peak pressure: 21 mm Hg (S). - Pericardium, extracardiac: There was no pericardial effusion.  Impressions:  - Normal LV wall thickness with LVEF 65-70%. Grade 1 diastolic dysfunction. MAC with trivial mitral regurgitation. Bioprosthesis in aortic position as described above with no significant perivalvular regurgitation and peak gradient 19 mmHg. Mild RV enlargement. Mild tricuspid regurgitation with PASP estimated 21 mmHg.  07/2014 cath Hemodynamic Findings: Ao:   107/55             LV: 190/12/15 RA:  1         RV:  41/2/6 PA:   26/6 (mean 15)     PCWP:  7 Fick Cardiac Output: 4.76 L/min Fick Cardiac Index: 2.38 L/min/m2 Central Aortic Saturation: 96% Pulmonary Artery Saturation: 66%  Aortic valve data:  Peak to peak gradient 83 mm Hg Mean gradient 56 mmHg AVA 0.52 cm2  Angiographic Findings:  Left main: Normal caliber vessel with no obstructive disease.   Left Anterior Descending Artery: Large caliber vessel that courses to the apex. Moderate caliber diagonal Lelah Rennaker. No obstructive disease.   Circumflex Artery: Large caliber vessel with moderate caliber obtuse marginal Lekeya Rollings. No obstructive disease.    Right Coronary Artery: Large dominant vessel with no obstructive disease.   Left Ventricular Angiogram: LVEF=60%  Aortic root angiogram: The aortic root is not enlarged. There is supravalvular density.   Aortic valve calcification noted on plain fluoroscopy.   Impression: 1. No angiographic evidence of CAD 2. Normal LV systolic function 3. Severe gradient across the aortic valve into the aorta with density noted in the aortic root. Cannot exclude recurrent supravalvular stenosis along with aortic valve stenosis. 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  4. Normal filling pressures     Assessment and Plan  1. Aortic stenosis - s/p tissue AVR and aortic root graft placement - some SOB and LE edema, we will repeat her echo. CHange lasix to 41m prn   2. HTN -at goal, continue current meds  3. PAD - no symptoms, continue medical therapy.   4. OSA - poor cpap compliance, encouraged increased use particularly with her fatigue.   F/u 4 months     JArnoldo Lenis M.D.

## 2017-10-04 NOTE — Patient Instructions (Signed)
Medication Instructions:  Your physician recommends that you continue on your current medications as directed. Please refer to the Current Medication list given to you today. Lasix 40 mg As Needed Daily   Labwork: NONE   Testing/Procedures: Your physician has requested that you have an echocardiogram. Echocardiography is a painless test that uses sound waves to create images of your heart. It provides your doctor with information about the size and shape of your heart and how well your heart's chambers and valves are working. This procedure takes approximately one hour. There are no restrictions for this procedure.    Follow-Up: Your physician recommends that you schedule a follow-up appointment in: 4 Months with Dr. Harl Bowie    Any Other Special Instructions Will Be Listed Below (If Applicable).     If you need a refill on your cardiac medications before your next appointment, please call your pharmacy. Thank you for choosing [facility]!

## 2017-10-10 ENCOUNTER — Ambulatory Visit (HOSPITAL_COMMUNITY)
Admission: RE | Admit: 2017-10-10 | Discharge: 2017-10-10 | Disposition: A | Discharge status: Home / Self Care | Payer: BLUE CROSS/BLUE SHIELD | Source: Ambulatory Visit | Attending: Cardiology | Admitting: Cardiology

## 2017-10-10 DIAGNOSIS — E785 Hyperlipidemia, unspecified: Secondary | ICD-10-CM | POA: Diagnosis not present

## 2017-10-10 DIAGNOSIS — I081 Rheumatic disorders of both mitral and tricuspid valves: Secondary | ICD-10-CM | POA: Diagnosis not present

## 2017-10-10 DIAGNOSIS — R0602 Shortness of breath: Secondary | ICD-10-CM

## 2017-10-10 DIAGNOSIS — K219 Gastro-esophageal reflux disease without esophagitis: Secondary | ICD-10-CM | POA: Diagnosis not present

## 2017-10-10 NOTE — Progress Notes (Signed)
*  PRELIMINARY RESULTS* Echocardiogram 2D Echocardiogram has been performed.  Michelle Patton 10/10/2017, 10:36 AM

## 2017-10-11 ENCOUNTER — Encounter: Payer: Self-pay | Admitting: Cardiology

## 2017-10-15 ENCOUNTER — Ambulatory Visit: Payer: BLUE CROSS/BLUE SHIELD | Admitting: Orthopedic Surgery

## 2017-10-16 ENCOUNTER — Ambulatory Visit: Payer: BLUE CROSS/BLUE SHIELD | Admitting: Student

## 2017-10-22 ENCOUNTER — Other Ambulatory Visit: Payer: Self-pay | Admitting: Radiology

## 2017-10-22 DIAGNOSIS — M1712 Unilateral primary osteoarthritis, left knee: Secondary | ICD-10-CM

## 2017-10-22 MED ORDER — DICLOFENAC SODIUM 75 MG PO TBEC: 75.0000 mg | DELAYED_RELEASE_TABLET | Freq: Two times a day (BID) | ORAL | 1 refills | Status: DC

## 2017-10-23 ENCOUNTER — Other Ambulatory Visit: Payer: Self-pay

## 2017-10-23 MED ORDER — FUROSEMIDE 40 MG PO TABS: 40.0000 mg | ORAL_TABLET | Freq: Every day | ORAL | 3 refills | Status: DC | PRN

## 2017-10-24 ENCOUNTER — Other Ambulatory Visit: Payer: Self-pay | Admitting: Radiology

## 2017-11-14 ENCOUNTER — Other Ambulatory Visit: Payer: Self-pay | Admitting: Orthopedic Surgery

## 2017-11-14 DIAGNOSIS — M1712 Unilateral primary osteoarthritis, left knee: Secondary | ICD-10-CM

## 2017-11-17 ENCOUNTER — Ambulatory Visit (HOSPITAL_COMMUNITY)
Admission: RE | Admit: 2017-11-17 | Discharge: 2017-11-17 | Disposition: A | Discharge status: Home / Self Care | Payer: BLUE CROSS/BLUE SHIELD | Source: Ambulatory Visit | Attending: Internal Medicine | Admitting: Internal Medicine

## 2017-11-17 ENCOUNTER — Other Ambulatory Visit (HOSPITAL_COMMUNITY): Payer: Self-pay | Admitting: Internal Medicine

## 2017-11-17 DIAGNOSIS — M25561 Pain in right knee: Secondary | ICD-10-CM | POA: Insufficient documentation

## 2017-11-17 DIAGNOSIS — R52 Pain, unspecified: Secondary | ICD-10-CM

## 2017-11-17 DIAGNOSIS — M25461 Effusion, right knee: Secondary | ICD-10-CM | POA: Insufficient documentation

## 2017-11-20 ENCOUNTER — Ambulatory Visit: Payer: BLUE CROSS/BLUE SHIELD | Admitting: Orthopaedic Surgery

## 2017-11-20 ENCOUNTER — Encounter: Payer: Self-pay | Admitting: Orthopaedic Surgery

## 2017-11-20 VITALS — BP 152/95 | HR 70 | Ht 66.0 in | Wt 228.0 lb

## 2017-11-20 DIAGNOSIS — M25561 Pain in right knee: Secondary | ICD-10-CM | POA: Diagnosis not present

## 2017-11-20 DIAGNOSIS — M1711 Unilateral primary osteoarthritis, right knee: Secondary | ICD-10-CM

## 2017-11-20 DIAGNOSIS — G8929 Other chronic pain: Secondary | ICD-10-CM

## 2017-11-20 NOTE — Progress Notes (Addendum)
[name], female DOB:Feb 02, 1956, 62 y.o. EAV:[id number]  Chief Complaint  Patient presents with  . Follow-up    R knee pain recent injury 11/16/17    HPI  Michelle Patton is a 62 y.o. female who hurt her left knee while on a business trip from [place], [place].  She had been at a meeting there. She had climbed stairs, walked more than usual.  At the airport in [place], her incoming flight was two hours late. She had only a brief time to get to her departure gate. She walked briskly and even ran to get to the gate.  She suddenly felt a "pop" in the right knee and had medial pain.  She almost fell but did not. She managed to get to the gate and make the flight.  After arrival back home, she had marked swelling of the right knee and pain.  It was giving way.  She had no redness.  She went to the Urgent Care on 11-17-17 and had x-rays which were negative for fracture but she had a small effusion.  She was told to come here.  She has history of pain in the right knee.  She saw Dr. Aline Brochure for this and had a MRI in February, 2019 which showed: IMPRESSION: 1. Complex tear posterior horn medial meniscus with an oblique component extending from the periphery to the inferior surface, intersecting a vertical or radial tear closer to the free edge. 2. Grade 2 sprain or partial tearing of the proximal MCL. 3. Variable degree of chondral thinning although moderate to prominent in the medial compartment. 4. Small knee joint effusion with mild pes anserine bursitis.  She elected not to have surgery then.  She had done well until now.  I feel she has aggravated a pre-existing condition of the right knee with her increased walking and stair climbing and then the brisk walk and run in the airport.  I have asked her to see Dr. Aline Brochure in two weeks after his return from vacation and consider possible arthroscopy of the knee.    Body mass index is 36.8 kg/m.  ROS  Review of Systems   HENT: Negative for congestion.   Respiratory: Negative for cough and shortness of breath.   Cardiovascular: Negative for chest pain and leg swelling.  Endocrine: Negative for cold intolerance.  Musculoskeletal: Positive for arthralgias and joint swelling.  Allergic/Immunologic: Negative for environmental allergies.  Psychiatric/Behavioral: The patient is nervous/anxious.   All other systems reviewed and are negative.   All other systems reviewed and are negative.  Past Medical History:  Diagnosis Date  . Anxiety   . Aortic regurgitation 07/20/2014  . Aortic stenosis, severe 07/20/2014  . DVT (deep venous thrombosis) ([place])   . Family history of adverse reaction to anesthesia    Reports father deliurm in his 68's with CABG  . GERD (gastroesophageal reflux disease)   . History of pneumonia   . Hyperlipidemia   . Hypertension   . Insomnia, unspecified   . Muscle weakness (generalized)   . Peripheral artery disease ([place])   . 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 Sleep apnea    diagnosed multiple [facility]  . Supravalvular aortic stenosis, congenital - s/p repair during childhood     Past Surgical History:  Procedure Laterality Date  .  ABDOMINAL AORTAGRAM  06/24/12  . ABDOMINAL AORTAGRAM N/A 06/24/2012   Procedure: ABDOMINAL Maxcine Ham;  Surgeon: Angelia Mould, MD;  Location: [facility] CATH LAB;  Service: Cardiovascular;  Laterality: N/A;  . AORTIC VALVE REPLACEMENT N/A 10/07/2014   Procedure: REDO AORTIC VALVE REPLACEMENT (AVR);  Surgeon: Rexene Alberts, MD;  Location: [place];  Service: Open Heart Surgery;  Laterality: N/A;  . ASCENDING AORTIC ROOT REPLACEMENT N/A 10/07/2014   Procedure: ASCENDING AORTIC ROOT REPLACEMENT;  Surgeon: Rexene Alberts, MD;  Location: [place];  Service: Open Heart  Surgery;  Laterality: N/A;  . BIOPSY  09/27/2016   Procedure: BIOPSY;  Surgeon: Daneil Dolin, MD;  [facility];  Service: Endoscopy;;  colon  . [place]  . CERVICAL FUSION    . CHOLECYSTECTOMY    . COLONOSCOPY WITH PROPOFOL N/A 09/27/2016   Procedure: COLONOSCOPY WITH PROPOFOL;  Surgeon: Daneil Dolin, MD;  [facility];  Service: Endoscopy;  Laterality: N/A;  1:00pm  . ESOPHAGOGASTRODUODENOSCOPY (EGD) WITH PROPOFOL N/A 09/27/2016   Procedure: ESOPHAGOGASTRODUODENOSCOPY (EGD) WITH PROPOFOL;  Surgeon: Daneil Dolin, MD;  [facility];  Service: Endoscopy;  Laterality: N/A;  . ILIAC ARTERY STENT Left 12/2007  . LEFT AND RIGHT HEART CATHETERIZATION WITH CORONARY ANGIOGRAM N/A 07/31/2014   Procedure: LEFT AND RIGHT HEART CATHETERIZATION WITH CORONARY ANGIOGRAM;  Surgeon: Burnell Blanks, MD;  Location: [facility] CATH LAB;  Service: Cardiovascular;  Laterality: N/A;  . Venia Minks DILATION N/A 09/27/2016   Procedure: Venia Minks DILATION;  Surgeon: Daneil Dolin, MD;  [facility];  Service: Endoscopy;  Laterality: N/A;  . POLYPECTOMY  09/27/2016   Procedure: POLYPECTOMY;  Surgeon: Daneil Dolin, MD;  [facility];  Service: Endoscopy;;  colon  . ROTATOR CUFF REPAIR Bilateral   . TEE WITHOUT CARDIOVERSION N/A 07/07/2014   Procedure: TRANSESOPHAGEAL ECHOCARDIOGRAM (TEE);  Surgeon: Arnoldo Lenis, MD;  [facility];  Service: Cardiology;  Laterality: N/A;  . TEE WITHOUT CARDIOVERSION N/A 07/20/2014   Procedure: TRANSESOPHAGEAL ECHOCARDIOGRAM (TEE) WITH PROPOFOL;  Surgeon: Arnoldo Lenis, MD;  Location[facility];  Service: Endoscopy;  Laterality: N/A;  . TEE WITHOUT CARDIOVERSION N/A 10/07/2014   Procedure: TRANSESOPHAGEAL ECHOCARDIOGRAM (TEE);  Surgeon: Rexene Alberts, MD;  Location: [place];  Service: Open Heart Surgery;  Laterality: N/A;    Family History  Problem Relation Age of Onset  . Hypertension Mother   .  Hypertension Father   . Heart disease Father        before age 29  . Other Father        varicose veins  . COPD Father   . Colon cancer Neg Hx   . Celiac disease Neg Hx   . Inflammatory bowel disease Neg Hx     Social History Social History   Tobacco Use  . Smoking status: Current Every Day Smoker    Packs/day: 0.50    Years: 40.00    Pack years: 20.00    Types: Cigarettes    Last attempt to quit: 06/10/2017    Years since quitting: 0.4  . Smokeless tobacco: Never Used  Substance Use Topics  . Alcohol use: Yes    Alcohol/week: 2.4 oz    Types: 4 Cans of beer per week    Comment: 4 drinks per week  . Drug use: No    Allergies  Allergen Reactions  . Penicillins Hives    Has patient had a PCN  reaction causing immediate rash, facial/tongue/throat swelling, SOB or lightheadedness with hypotension: Yes Has patient had a PCN reaction causing severe rash involving mucus membranes or skin necrosis: No Has patient had a PCN reaction that required hospitalization No Has patient had a PCN reaction occurring within the last 10 years: No If all of the above answers are "NO", then may proceed with Cephalosporin use.  Have taken Keflex before without any adverse reaction.   . Sulfa Antibiotics Nausea And Vomiting    Current Outpatient Medications  Medication Sig Dispense Refill  . aspirin 81 MG tablet Take 81 mg by mouth daily.    . Calcium Carb-Cholecalciferol (CALCIUM 600+D3) 600-800 MG-UNIT TABS Take 1 tablet by mouth every morning.     . Cholecalciferol (VITAMIN D3) 5000 UNITS TABS Take 5,000 Units by mouth every morning.     . clonazePAM (KLONOPIN) 1 MG tablet     . Cranberry-Vitamin C-Vitamin E (CRANBERRY PLUS VITAMIN C) 4200-20-3 MG-MG-UNIT CAPS Take 1 capsule by mouth every morning.     . diclofenac (VOLTAREN) 75 MG EC tablet Take 1 tablet (75 mg total) by mouth 2 (two) times daily. 180 tablet 1  . diclofenac (VOLTAREN) 75 MG EC tablet TAKE 1 TABLET BY MOUTH TWICE A DAY 60  tablet 2  . estradiol (ESTRACE) 1 MG tablet Take 1 mg by mouth every morning.     . furosemide (LASIX) 40 MG tablet Take 1 tablet (40 mg total) by mouth daily as needed for edema. 90 tablet 3  . Magnesium 250 MG TABS Take 250 mg by mouth every morning.     . medroxyPROGESTERone (PROVERA) 2.5 MG tablet Take 2.5 mg by mouth every morning.     . metoprolol tartrate (LOPRESSOR) 25 MG tablet TAKE 1 TABLET TWICE A DAY 180 tablet 3  . Multiple Vitamins-Minerals (MULTIVITAMIN WITH MINERALS) tablet Take 1 tablet by mouth every morning.     . Potassium 99 MG TABS Take 99 mg by mouth every morning. OTC    . RABEprazole (ACIPHEX) 20 MG tablet Take 20 mg by mouth daily.    . rosuvastatin (CRESTOR) 10 MG tablet     . zolpidem (AMBIEN) 10 MG tablet Take 10 mg by mouth at bedtime as needed for sleep.      No current facility-administered medications for this visit.      Physical Exam  Blood pressure (!) 152/95, pulse 70, height 5\' 6"  (1.676 m), weight 228 lb (103.4 kg).  Constitutional: overall normal hygiene, normal nutrition, well developed, normal grooming, normal body habitus. Assistive device:none  Musculoskeletal: gait and station Limp right, muscle tone and strength are normal, no tremors or atrophy is present.  .  Neurological: coordination overall normal.  Deep tendon reflex/nerve stretch intact.  Sensation normal.  Cranial nerves II-XII intact.   Skin:   Normal overall no scars, lesions, ulcers or rashes. No psoriasis.  Psychiatric: Alert and oriented x 3.  Recent memory intact, remote memory unclear.  Normal mood and affect. Well groomed.  Good eye contact.  Cardiovascular: overall no swelling, no varicosities, no edema bilaterally, normal temperatures of the legs and arms, no clubbing, cyanosis and good capillary refill.  Lymphatic: palpation is normal.  The right lower extremity is examined:  Inspection:  Thigh:  Non-tender and no defects  Knee has swelling 1+ effusion.                         Joint tenderness is present  Patient is tender over the medial joint line  Lower Leg:  Has normal appearance and no tenderness or defects  Ankle:  Non-tender and no defects  Foot:  Non-tender and no defects Range of Motion:  Knee:  Range of motion is: 0-110                        Crepitus is  present  Ankle:  Range of motion is normal. Strength and Tone:  The right lower extremity has normal strength and tone. Stability:  Knee:  The knee has positive medial Mc[place]ray.  Ankle:  The ankle is stable.   All other systems reviewed and are negative   The patient has been educated about the nature of the problem(s) and counseled on treatment options.  The patient appeared to understand what I have discussed and is in agreement with it.  Encounter Diagnoses  Name Primary?  . Chronic pain of right knee Yes  . Primary osteoarthritis of right knee     PLAN Call if any problems.  Precautions discussed.  Continue current medications.   PROCEDURE NOTE:  The patient requests injections of the right knee , verbal consent was obtained.  The right knee was prepped appropriately after time out was performed.   Sterile technique was observed and injection of 1 cc of Depo-Medrol 40 mg with several cc's of plain xylocaine. Anesthesia was provided by ethyl chloride and a 20-gauge needle was used to inject the knee area. The injection was tolerated well.  A band aid dressing was applied.  The patient was advised to apply ice later today and tomorrow to the injection sight as needed.  Return to clinic 2 weeks   See Dr. Aline Brochure on return.  Electronically Signed Sanjuana Kava, MD 7/30/20193:43 PM

## 2017-12-03 ENCOUNTER — Encounter: Payer: Self-pay | Admitting: Internal Medicine

## 2017-12-19 ENCOUNTER — Ambulatory Visit: Payer: BLUE CROSS/BLUE SHIELD | Admitting: Orthopedic Surgery

## 2017-12-19 ENCOUNTER — Encounter: Payer: Self-pay | Admitting: Orthopedic Surgery

## 2017-12-19 VITALS — BP 112/71 | HR 80 | Ht 66.0 in | Wt 229.0 lb

## 2017-12-19 DIAGNOSIS — M1711 Unilateral primary osteoarthritis, right knee: Secondary | ICD-10-CM

## 2017-12-19 DIAGNOSIS — M23321 Other meniscus derangements, posterior horn of medial meniscus, right knee: Secondary | ICD-10-CM

## 2017-12-19 DIAGNOSIS — S83411D Sprain of medial collateral ligament of right knee, subsequent encounter: Secondary | ICD-10-CM

## 2017-12-19 NOTE — Patient Instructions (Signed)
Meniscus Injury, Arthroscopy Arthroscopy is a surgical procedure that involves the use of a small scope that has a camera and surgical instruments on the end (arthroscope). An arthroscope can be used to repair your meniscus injury.  LET [facility] CARE PROVIDER KNOW ABOUT:  Any allergies you have.  All medicines you are taking, including vitamins, herbs, eyedrops, creams, and over-the-counter medicines.  Any recent colds or infections you have had or currently have.  Previous problems you or members of your family have had with the use of anesthetics.  Any blood disorders or blood clotting problems you have.  Previous surgeries you have had.  Medical conditions you have. RISKS AND COMPLICATIONS Generally, this is a safe procedure. However, as with any procedure, problems can occur. Possible problems include:  Damage to nerves or blood vessels.  Excess bleeding.  Blood clots.  Infection. BEFORE THE PROCEDURE  Do not eat or drink for 6-8 hours before the procedure.  Take medicines as directed by your surgeon. Ask your surgeon about changing or stopping your regular medicines.  You may have lab tests the morning of surgery. PROCEDURE  You will be given one of the following:   A medicine that numbs the area (local anesthesia).  A medicine that makes you go to sleep (general anesthesia).  A medicine injected into your spine that numbs your body below the waist (spinal anesthesia). Most often, several small cuts (incisions) are made in the knee. The arthroscope and instruments go into the incisions to repair the damage. The torn portion of the meniscus is removed.   AFTER THE PROCEDURE  You will be taken to the recovery area where your progress will be monitored. When you are awake, stable, and taking fluids without complications, you will be allowed to go home. This is usually the same day. A torn or stretched ligament (ligament sprain) may take 6-8 weeks to heal.   It  takes about the 4-6 WEEKS if your surgeon removed a torn meniscus.  A repaired meniscus may require 6-12 weeks of recovery time.  A torn ligament needing reconstructive surgery may take 6-12 months to heal fully.   This information is not intended to replace advice given to you by your health care provider. Make sure you discuss any questions you have with your health care provider. You have decided to proceed with operative arthroscopy of the knee. You have decided not to continue with nonoperative measures such as but not limited to oral medication, weight loss, activity modification, physical therapy, bracing, or injection.  We will perform operative arthroscopy of the knee. Some of the risks associated with arthroscopic surgery of the knee include but are not limited to Bleeding Infection Swelling Stiffness Blood clot Pain  If you're not comfortable with these risks and would like to continue with nonoperative treatment please let Dr. Aline Brochure know prior to your surgery.   Document Released: 04/07/2000 Document Revised: 04/15/2013 Document Reviewed: 09/06/2012 Elsevier Interactive Patient Education 2016 [place] have decided to proceed with operative arthroscopy of the knee. You have decided not to continue with nonoperative measures such as but not limited to oral medication, weight loss, activity modification, physical therapy, bracing, or injection.  In compliance with recent [place] law in federal regulation regarding opioid use and abuse and addiction, we will taper (stop) opioid medication after 2 weeks.  We will perform operative arthroscopy of the knee. Some of the risks associated with arthroscopic surgery of the knee include but are not limited to  Bleeding Infection Swelling Stiffness Blood clot Pain  If you're not comfortable with these risks and would like to continue with nonoperative treatment please let Dr. Aline Brochure know prior to your surgery.

## 2017-12-19 NOTE — Progress Notes (Signed)
preop  PATIENT OFFICE VISI  Chief Complaint  Patient presents with  . Knee Pain    Right knee pain     62 year old female with OA of the knee had an MRI which showed she had a medial meniscus tear she subsequently reinjured the knee.  Presents for possible surgery on the right knee  X-rays and MRI findings show osteoarthritis of the right knee full-thickness cartilage loss on the medial femoral condyle MCL injury torn medial meniscus  Patient complains of medial and inferior patellar pain pain going up and down steps.  Pain is become disabling for her.  Right knee Medial inferior Over a year worse in the last month Dull ache   Review of Systems  Musculoskeletal: Positive for back pain and joint pain.  Neurological: Negative for tingling and sensory change.   Denies chest pain or shortness of breath  Past Medical History:  Diagnosis Date  . Anxiety   . Aortic regurgitation 07/20/2014  . Aortic stenosis, severe 07/20/2014  . DVT (deep venous thrombosis) ([place])   . Family history of adverse reaction to anesthesia    Reports father deliurm in his 56's with CABG  . GERD (gastroesophageal reflux disease)   . History of pneumonia   . Hyperlipidemia   . Hypertension   . Insomnia, unspecified   . Muscle weakness (generalized)   . Peripheral artery disease ([place])   . 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 Sleep apnea    diagnosed multiple years ago at [facility]  . Supravalvular aortic stenosis, congenital - s/p repair during childhood     Past Surgical History:  Procedure Laterality Date  . ABDOMINAL AORTAGRAM  06/24/12  . ABDOMINAL AORTAGRAM N/A 06/24/2012   Procedure: ABDOMINAL Maxcine Ham;  Surgeon: Angelia Mould, MD;  Location: [facility] CATH LAB;  Service: Cardiovascular;  Laterality: N/A;  . AORTIC  VALVE REPLACEMENT N/A 10/07/2014   Procedure: REDO AORTIC VALVE REPLACEMENT (AVR);  Surgeon: Rexene Alberts, MD;  Location: [place];  Service: Open Heart Surgery;  Laterality: N/A;  . ASCENDING AORTIC ROOT REPLACEMENT N/A 10/07/2014   Procedure: ASCENDING AORTIC ROOT REPLACEMENT;  Surgeon: Rexene Alberts, MD;  Location: [place];  Service: Open Heart Surgery;  Laterality: N/A;  . BIOPSY  09/27/2016   Procedure: BIOPSY;  Surgeon: Daneil Dolin, MD;  [facility];  Service: Endoscopy;;  colon  . [place]  . CERVICAL FUSION    . CHOLECYSTECTOMY    . COLONOSCOPY WITH PROPOFOL N/A 09/27/2016   Procedure: COLONOSCOPY WITH PROPOFOL;  Surgeon: Daneil Dolin, MD;  [facility];  Service: Endoscopy;  Laterality: N/A;  1:00pm  . ESOPHAGOGASTRODUODENOSCOPY (EGD) WITH PROPOFOL N/A 09/27/2016   Procedure: ESOPHAGOGASTRODUODENOSCOPY (EGD) WITH PROPOFOL;  Surgeon: Daneil Dolin, MD;  [facility];  Service: Endoscopy;  Laterality: N/A;  . ILIAC ARTERY STENT Left 12/2007  . LEFT AND RIGHT HEART CATHETERIZATION WITH CORONARY ANGIOGRAM N/A 07/31/2014   Procedure: LEFT AND RIGHT HEART CATHETERIZATION WITH CORONARY ANGIOGRAM;  Surgeon: Burnell Blanks, MD;  Location[facility] CATH LAB;  Service: Cardiovascular;  Laterality: N/A;  . Venia Minks DILATION N/A 09/27/2016   Procedure: Venia Minks DILATION;  Surgeon: Daneil Dolin, MD;  [facility];  Service: Endoscopy;  Laterality: N/A;  . POLYPECTOMY  09/27/2016  Procedure: POLYPECTOMY;  Surgeon: Daneil Dolin, MD;  [facility];  Service: Endoscopy;;  colon  . ROTATOR CUFF REPAIR Bilateral   . TEE WITHOUT CARDIOVERSION N/A 07/07/2014   Procedure: TRANSESOPHAGEAL ECHOCARDIOGRAM (TEE);  Surgeon: Arnoldo Lenis, MD;  [facility];  Service: Cardiology;  Laterality: N/A;  . TEE WITHOUT CARDIOVERSION N/A 07/20/2014   Procedure: TRANSESOPHAGEAL ECHOCARDIOGRAM (TEE) WITH PROPOFOL;  Surgeon: Arnoldo Lenis, MD;  Location: [facility];  Service: Endoscopy;  Laterality: N/A;  . TEE WITHOUT CARDIOVERSION N/A 10/07/2014   Procedure: TRANSESOPHAGEAL ECHOCARDIOGRAM (TEE);  Surgeon: Rexene Alberts, MD;  Location: [place];  Service: Open Heart Surgery;  Laterality: N/A;    Family History  Problem Relation Age of Onset  . Hypertension Mother   . Hypertension Father   . Heart disease Father        before age 53  . Other Father        varicose veins  . COPD Father   . Colon cancer Neg Hx   . Celiac disease Neg Hx   . Inflammatory bowel disease Neg Hx    Social History   Tobacco Use  . Smoking status: Current Every Day Smoker    Packs/day: 0.50    Years: 40.00    Pack years: 20.00    Types: Cigarettes    Last attempt to quit: 06/10/2017    Years since quitting: 0.5  . Smokeless tobacco: Never Used  Substance Use Topics  . Alcohol use: Yes    Alcohol/week: 4.0 standard drinks    Types: 4 Cans of beer per week    Comment: 4 drinks per week  . Drug use: No    Allergies  Allergen Reactions  . Penicillins Hives    Has patient had a PCN reaction causing immediate rash, facial/tongue/throat swelling, SOB or lightheadedness with hypotension: Yes Has patient had a PCN reaction causing severe rash involving mucus membranes or skin necrosis: No Has patient had a PCN reaction that required hospitalization No Has patient had a PCN reaction occurring within the last 10 years: No If all of the above answers are "NO", then may proceed with Cephalosporin use.  Have taken Keflex before without any adverse reaction.   . Sulfa Antibiotics Nausea And Vomiting    Current Meds  Medication Sig  . aspirin 81 MG tablet Take 81 mg by mouth daily.  . Calcium Carb-Cholecalciferol (CALCIUM 600+D3) 600-800 MG-UNIT TABS Take 1 tablet by mouth every morning.   . Cholecalciferol (VITAMIN D3) 5000 UNITS TABS Take 5,000 Units by mouth every morning.   . clonazePAM (KLONOPIN) 1 MG tablet   . Cranberry-Vitamin  C-Vitamin E (CRANBERRY PLUS VITAMIN C) 4200-20-3 MG-MG-UNIT CAPS Take 1 capsule by mouth every morning.   . diclofenac (VOLTAREN) 75 MG EC tablet Take 1 tablet (75 mg total) by mouth 2 (two) times daily.  . diclofenac (VOLTAREN) 75 MG EC tablet TAKE 1 TABLET BY MOUTH TWICE A DAY  . estradiol (ESTRACE) 1 MG tablet Take 1 mg by mouth every morning.   . furosemide (LASIX) 40 MG tablet Take 1 tablet (40 mg total) by mouth daily as needed for edema.  . Magnesium 250 MG TABS Take 250 mg by mouth every morning.   . medroxyPROGESTERone (PROVERA) 2.5 MG tablet Take 2.5 mg by mouth every morning.   . metoprolol tartrate (LOPRESSOR) 25 MG tablet TAKE 1 TABLET TWICE A DAY  . Multiple Vitamins-Minerals (MULTIVITAMIN WITH MINERALS) tablet Take 1 tablet by  mouth every morning.   . Potassium 99 MG TABS Take 99 mg by mouth every morning. OTC  . RABEprazole (ACIPHEX) 20 MG tablet Take 20 mg by mouth daily.  . rosuvastatin (CRESTOR) 10 MG tablet   . zolpidem (AMBIEN) 10 MG tablet Take 10 mg by mouth at bedtime as needed for sleep.     BP 112/71   Pulse 80   Ht 5\' 6"  (1.676 m)   Wt 229 lb (103.9 kg)   BMI 36.96 kg/m   Physical Exam  Constitutional: She is oriented to person, place, and time. She appears well-developed and well-nourished.  Musculoskeletal:       Right knee: She exhibits no effusion.  Neurological: She is alert and oriented to person, place, and time.  Psychiatric: She has a normal mood and affect. Judgment normal.  Vitals reviewed.   Right Knee Exam   Muscle Strength  The patient has normal right knee strength.  Tenderness  The patient is experiencing tenderness in the medial joint line, MCL and patella.  Range of Motion  Extension: normal  Flexion: normal   Tests  [place]:  Medial - positive Lateral - negative Varus: negative Valgus: negative Drawer:  Anterior - negative    Posterior - negative  Other  Erythema: absent Scars: absent Sensation: normal Pulse:  present Swelling: none Effusion: no effusion present   Left Knee Exam   Muscle Strength  The patient has normal left knee strength.  Tenderness  The patient is experiencing tenderness in the medial joint line and lateral joint line.  Range of Motion  Extension: normal  Flexion: normal   Tests  McMurray:  Medial - negative Lateral - negative Varus: negative Valgus: negative Drawer:  Anterior - negative     Posterior - negative  Other  Erythema: absent Scars: absent Sensation: normal Pulse: present Swelling: none        MEDICAL DECISION SECTION  Xrays were done at Office.  Plain films. Hospital MRI My independent reading of xrays:  Torn medial meniscus cartilage thinning patella and medial femoral condyle with osteoarthritis mild on x-ray  Encounter Diagnoses  Name Primary?  . Derangement of posterior horn of medial meniscus of right knee Yes  . Primary osteoarthritis of right knee   . Sprain of medial collateral ligament of right knee, subsequent encounter     PLAN: (Rx., injectx, surgery, frx, mri/ct) Arthroscopy right knee partial medial meniscectomy  MCL will heal on its own  Arthritic symptoms residual may be present and has been explained to the patient  The procedure has been fully reviewed with the patient; The risks and benefits of surgery have been discussed and explained and understood. Alternative treatment has also been reviewed, questions were encouraged and answered. The postoperative plan is also been reviewed.  No orders of the defined types were placed in this encounter.   Arther Abbott, MD  12/19/2017 8:54 AM

## 2017-12-31 NOTE — Patient Instructions (Signed)
Your procedure is scheduled on: 01/10/2018  Report to [facility] at  7:30   AM.  Call this number if you have problems the morning of surgery: [phone]   Remember:   Do not drink or eat food:After Midnight.  :  Take these medicines the morning of surgery with A SIP OF WATER:Klonopin, Metoprolol, and Aciphex    Do not wear jewelry, make-up or nail polish.  Do not wear lotions, powders, or perfumes. You may wear deodorant.  Do not shave 48 hours prior to surgery. Men may shave face and neck.  Do not bring valuables to the hospital.  Contacts, dentures or bridgework may not be worn into surgery.  Leave suitcase in the car. After surgery it may be brought to your room.  For patients admitted to the hospital, checkout time is 11:00 AM the day of discharge.   Patients discharged the day of surgery will not be allowed to drive home.    Special Instructions: Shower using CHG night before surgery and shower the day of surgery use CHG.  Use special wash - you have one bottle of CHG for all showers.  You should use approximately 1/2 of the bottle for each shower. Knee Arthroscopy Knee arthroscopy is a surgical procedure that is used to examine the inside of your knee joint and repair any damage. The surgeon puts a small, lighted instrument with a camera on the tip (arthroscope) through a small incision in your knee. The camera sends pictures to a monitor in the operating room. Your surgeon uses those pictures to guide the surgical instruments through other incisions to the area of damage. Knee arthroscopy can be used to treat many types of knee problems. It may be used:  To repair a torn ligament.  To repair or remove damaged tissue.  To remove a fluid-filled sac (cyst) from your knee.  Tell a health care provider about:  Any allergies you have.  All medicines you are taking, including vitamins, herbs, eye drops, creams, and over-the-counter medicines.  Any problems you or family  members have had with anesthetic medicines.  Any blood disorders you have.  Any surgeries you have had.  Any medical conditions you have. What are the risks? Generally, this is a safe procedure. However, problems may occur, including:  Infection.  Bleeding.  Damage to blood vessels, nerves, or structures of your knee.  A blood clot that forms in your leg and travels to your lung.  Failure to relieve symptoms.  What happens before the procedure?  Ask your health care provider about: ? Changing or stopping your regular medicines. This is especially important if you are taking diabetes medicines or blood thinners. ? Taking medicines such as aspirin and ibuprofen. These medicines can thin your blood. Do not take these medicines before your procedure if your health care provider instructs you not to.  Follow your health care provider's instructions about eating or drinking restrictions.  Plan to have someone take you home after the procedure.  If you go home right after the procedure, plan to have someone with you for 24 hours.  Do not drink alcohol unless your health care provider says that you can.  Do not use any tobacco products, including cigarettes, chewing tobacco, or electronic cigarettes unless your health care provider says that you can. If you need help quitting, ask your health care provider.  You may have a physical exam. What happens during the procedure?  An IV tube will be inserted into  one of your veins.  You will be given one or more of the following: ? A medicine that helps you relax (sedative). ? A medicine that numbs the area (local anesthetic). ? A medicine that makes you fall asleep (general anesthetic). ? A medicine that is injected into your spine that numbs the area below and slightly above the injection site (spinal anesthetic). ? A medicine that is injected into an area of your body that numbs everything below the injection site (regional  anesthetic).  A cuff may be placed around your upper leg to slow bleeding during the procedure.  The surgeon will make a small number of incisions around your knee.  Your knee joint will be flushed and filled with a germ-free (sterile) solution.  The arthroscope will be passed through an incision into your knee joint.  More instruments will be passed through other incisions to repair your knee as needed.  The fluid will be removed from your knee.  The incisions will be closed with adhesive strips or stitches (sutures).  A bandage (dressing) will be placed over your knee. The procedure may vary among health care providers and hospitals. What happens after the procedure?  Your blood pressure, heart rate, breathing rate and blood oxygen level will be monitored often until the medicines you were given have worn off.  You may be given medicine for pain.  You may get crutches to help you walk without using your knee to support your body weight.  You may have to wear compression stockings. These stocking help to prevent blood clots and reduce swelling in your legs. This information is not intended to replace advice given to you by your health care provider. Make sure you discuss any questions you have with your health care provider. Document Released: 04/07/2000 Document Revised: 09/16/2015 Document Reviewed: 04/06/2014 Elsevier Interactive Patient Education  2018 [place].  Knee Arthroscopy, Care After Refer to this sheet in the next few weeks. These instructions provide you with information about caring for yourself after your procedure. Your health care provider may also give you more specific instructions. Your treatment has been planned according to current medical practices, but problems sometimes occur. Call your health care provider if you have any problems or questions after your procedure. What can I expect after the procedure? After the procedure, it is common to  have:  Soreness.  Pain.  Follow these instructions at home: Bathing  Do not take baths, swim, or use a hot tub until your health care provider approves. Incision care  There are many different ways to close and cover an incision, including stitches, skin glue, and adhesive strips. Follow your health care provider's instructions about: ? Incision care. ? Bandage (dressing) changes and removal. ? Incision closure removal.  Check your incision area every day for signs of infection. Watch for: ? Redness, swelling, or pain. ? Fluid, blood, or pus. Activity  Avoid strenuous activities for as long as directed by your health care provider.  Return to your normal activities as directed by your health care provider. Ask your health care provider what activities are safe for you.  Perform range-of-motion exercises only as directed by your health care provider.  Do not lift anything that is heavier than 10 lb (4.5 kg).  Do not drive or operate heavy machinery while taking pain medicine.  If you were given crutches, use them as directed by your health care provider. Managing pain, stiffness, and swelling  If directed, apply ice to the injured  area: ? Put ice in a plastic bag. ? Place a towel between your skin and the bag. ? Leave the ice on for 20 minutes, 2-3 times per day.  Raise the injured area above the level of your heart while you are sitting or lying down as directed by your health care provider. General instructions  Keep all follow-up visits as directed by your health care provider. This is important.  Take medicines only as directed by your health care provider.  Do not use any tobacco products, including cigarettes, chewing tobacco, or electronic cigarettes. If you need help quitting, ask your health care provider.  If you were given compression stockings, wear them as directed by your health care provider. These stockings help prevent blood clots and reduce swelling  in your legs. Contact a health care provider if:  You have severe pain with any movement of your knee.  You notice a bad smell coming from the incision or dressing.  You have redness, swelling, or pain at the site of your incision.  You have fluid, blood, or pus coming from your incision. Get help right away if:  You develop a rash.  You have a fever.  You have difficulty breathing or have shortness of breath.  You develop pain in your calves or in the back of your knee.  You develop chest pain.  You develop numbness or tingling in your leg or foot. This information is not intended to replace advice given to you by your health care provider. Make sure you discuss any questions you have with your health care provider. Document Released: 10/28/2004 Document Revised: 09/10/2015 Document Reviewed: 04/06/2014 Elsevier Interactive Patient Education  2018 [place] Anesthesia, Adult, Care After These instructions provide you with information about caring for yourself after your procedure. Your health care provider may also give you more specific instructions. Your treatment has been planned according to current medical practices, but problems sometimes occur. Call your health care provider if you have any problems or questions after your procedure. What can I expect after the procedure? After the procedure, it is common to have:  Vomiting.  A sore throat.  Mental slowness.  It is common to feel:  Nauseous.  Cold or shivery.  Sleepy.  Tired.  Sore or achy, even in parts of your body where you did not have surgery.  Follow these instructions at home: For at least 24 hours after the procedure:  Do not: ? Participate in activities where you could fall or become injured. ? Drive. ? Use heavy machinery. ? Drink alcohol. ? Take sleeping pills or medicines that cause drowsiness. ? Make important decisions or sign legal documents. ? Take care of children on  your own.  Rest. Eating and drinking  If you vomit, drink water, juice, or soup when you can drink without vomiting.  Drink enough fluid to keep your urine clear or pale yellow.  Make sure you have little or no nausea before eating solid foods.  Follow the diet recommended by your health care provider. General instructions  Have a responsible adult stay with you until you are awake and alert.  Return to your normal activities as told by your health care provider. Ask your health care provider what activities are safe for you.  Take over-the-counter and prescription medicines only as told by your health care provider.  If you smoke, do not smoke without supervision.  Keep all follow-up visits as told by your health care provider. This is important.  Contact a health care provider if:  You continue to have nausea or vomiting at home, and medicines are not helpful.  You cannot drink fluids or start eating again.  You cannot urinate after 8-12 hours.  You develop a skin rash.  You have fever.  You have increasing redness at the site of your procedure. Get help right away if:  You have difficulty breathing.  You have chest pain.  You have unexpected bleeding.  You feel that you are having a life-threatening or urgent problem. This information is not intended to replace advice given to you by your health care provider. Make sure you discuss any questions you have with your health care provider. Document Released: 07/17/2000 Document Revised: 09/13/2015 Document Reviewed: 03/25/2015 Elsevier Interactive Patient Education  [employer].

## 2018-01-03 ENCOUNTER — Telehealth: Payer: Self-pay | Admitting: Radiology

## 2018-01-03 ENCOUNTER — Encounter (HOSPITAL_COMMUNITY)
Admission: RE | Admit: 2018-01-03 | Discharge: 2018-01-03 | Disposition: A | Discharge status: Home / Self Care | Payer: BLUE CROSS/BLUE SHIELD | Source: Ambulatory Visit | Attending: Orthopedic Surgery | Admitting: Orthopedic Surgery

## 2018-01-03 ENCOUNTER — Encounter (HOSPITAL_COMMUNITY): Payer: Self-pay

## 2018-01-03 ENCOUNTER — Other Ambulatory Visit: Payer: Self-pay

## 2018-01-03 DIAGNOSIS — Z01812 Encounter for preprocedural laboratory examination: Secondary | ICD-10-CM | POA: Insufficient documentation

## 2018-01-03 HISTORY — DX: Unspecified osteoarthritis, unspecified site: M19.90

## 2018-01-03 LAB — CBC WITH DIFFERENTIAL/PLATELET
BASOS ABS: 0.1 10*3/uL (ref 0.0–0.1)
BASOS PCT: 1 %
Eosinophils Absolute: 0.3 10*3/uL (ref 0.0–0.7)
Eosinophils Relative: 4 %
HEMATOCRIT: 40 % (ref 36.0–46.0)
HEMOGLOBIN: 13.9 g/dL (ref 12.0–15.0)
Lymphocytes Relative: 28 %
Lymphs Abs: 2.4 10*3/uL (ref 0.7–4.0)
MCH: 31.6 pg (ref 26.0–34.0)
MCHC: 34.8 g/dL (ref 30.0–36.0)
MCV: 90.9 fL (ref 78.0–100.0)
Monocytes Absolute: 0.7 10*3/uL (ref 0.1–1.0)
Monocytes Relative: 8 %
NEUTROS ABS: 5.1 10*3/uL (ref 1.7–7.7)
NEUTROS PCT: 59 %
Platelets: 256 10*3/uL (ref 150–400)
RBC: 4.4 MIL/uL (ref 3.87–5.11)
RDW: 11.8 % (ref 11.5–15.5)
WBC: 8.6 10*3/uL (ref 4.0–10.5)

## 2018-01-03 LAB — BASIC METABOLIC PANEL
ANION GAP: 9 (ref 5–15)
BUN: 15 mg/dL (ref 6–20)
CHLORIDE: 103 mmol/L (ref 98–111)
CO2: 26 mmol/L (ref 22–32)
Calcium: 9.1 mg/dL (ref 8.9–10.3)
Creatinine, Ser: 1.06 mg/dL — ABNORMAL HIGH (ref 0.44–1.00)
GFR calc non Af Amer: 56 mL/min — ABNORMAL LOW (ref >60)
Glucose, Bld: 102 mg/dL — ABNORMAL HIGH (ref 70–99)
Potassium: 4 mmol/L (ref 3.5–5.1)
SODIUM: 138 mmol/L (ref 135–145)

## 2018-01-03 NOTE — Telephone Encounter (Signed)
Fax received prior authorization not  required for in network.

## 2018-01-03 NOTE — Telephone Encounter (Signed)
I called [facility] for auth of the surgery, to see if required. Have asked automated prompts to fax the information  After automated prompts taking 22 minutes, was told they are closed from 12- 1 for lunch.  Will have to look at the fax and see if this is helpful or not.

## 2018-01-09 NOTE — H&P (Addendum)
preop  PATIENT OFFICE VISI       Chief Complaint  Patient presents with  . Knee Pain      Right knee pain       62 year old female with OA of the knee had an MRI which showed she had a medial meniscus tear she subsequently reinjured the knee.  Presents for possible surgery on the right knee   X-rays and MRI findings show osteoarthritis of the right knee full-thickness cartilage loss on the medial femoral condyle MCL injury torn medial meniscus   Patient complains of medial and inferior patellar pain pain going up and down steps.  Pain is become disabling for her.   Right knee Medial inferior Over a year worse in the last month Dull ache     Review of Systems  Musculoskeletal: Positive for back pain and joint pain.  Neurological: Negative for tingling and sensory change.    Denies chest pain or shortness of breath       Past Medical History:  Diagnosis Date  . Anxiety    . Aortic regurgitation 07/20/2014  . Aortic stenosis, severe 07/20/2014  . DVT (deep venous thrombosis) ([place])    . Family history of adverse reaction to anesthesia      Reports father deliurm in his 66's with CABG  . GERD (gastroesophageal reflux disease)    . History of pneumonia    . Hyperlipidemia    . Hypertension    . Insomnia, unspecified    . Muscle weakness (generalized)    . Peripheral artery disease ([place])    . 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 Sleep apnea      diagnosed multiple years ago at [facility]  . Supravalvular aortic stenosis, congenital - s/p repair during childhood             Past Surgical History:  Procedure Laterality Date  . ABDOMINAL AORTAGRAM   06/24/12  . ABDOMINAL AORTAGRAM N/A 06/24/2012    Procedure: ABDOMINAL Maxcine Ham;  Surgeon: Angelia Mould, MD;  Location: [facility] CATH LAB;   Service: Cardiovascular;  Laterality: N/A;  . AORTIC VALVE REPLACEMENT N/A 10/07/2014    Procedure: REDO AORTIC VALVE REPLACEMENT (AVR);  Surgeon: Rexene Alberts, MD;  Location: [place];  Service: Open Heart Surgery;  Laterality: N/A;  . ASCENDING AORTIC ROOT REPLACEMENT N/A 10/07/2014    Procedure: ASCENDING AORTIC ROOT REPLACEMENT;  Surgeon: Rexene Alberts, MD;  Location: [place];  Service: Open Heart Surgery;  Laterality: N/A;  . BIOPSY   09/27/2016    Procedure: BIOPSY;  Surgeon: Daneil Dolin, MD;  [facility];  Service: Endoscopy;;  colon  . [place]  . CERVICAL FUSION      . CHOLECYSTECTOMY      . COLONOSCOPY WITH PROPOFOL N/A 09/27/2016    Procedure: COLONOSCOPY WITH PROPOFOL;  Surgeon: Daneil Dolin, MD;  Location: [facility];  Service: Endoscopy;  Laterality: N/A;  1:00pm  . ESOPHAGOGASTRODUODENOSCOPY (EGD) WITH PROPOFOL N/A 09/27/2016    Procedure: ESOPHAGOGASTRODUODENOSCOPY (EGD) WITH PROPOFOL;  Surgeon: Daneil Dolin, MD;  [facility];  Service: Endoscopy;  Laterality: N/A;  . ILIAC ARTERY STENT Left 12/2007  . LEFT AND RIGHT HEART CATHETERIZATION WITH CORONARY ANGIOGRAM N/A 07/31/2014    Procedure: LEFT AND RIGHT HEART  CATHETERIZATION WITH CORONARY ANGIOGRAM;  Surgeon: Burnell Blanks, MD;  Location: [facility] CATH LAB;  Service: Cardiovascular;  Laterality: N/A;  . Venia Minks DILATION N/A 09/27/2016    Procedure: Venia Minks DILATION;  Surgeon: Daneil Dolin, MD;  [facility];  Service: Endoscopy;  Laterality: N/A;  . POLYPECTOMY   09/27/2016    Procedure: POLYPECTOMY;  Surgeon: Daneil Dolin, MD;  [facility];  Service: Endoscopy;;  colon  . ROTATOR CUFF REPAIR Bilateral    . TEE WITHOUT CARDIOVERSION N/A 07/07/2014    Procedure: TRANSESOPHAGEAL ECHOCARDIOGRAM (TEE);  Surgeon: Arnoldo Lenis, MD;  [facility];  Service: Cardiology;  Laterality: N/A;  . TEE WITHOUT CARDIOVERSION N/A 07/20/2014    Procedure:  TRANSESOPHAGEAL ECHOCARDIOGRAM (TEE) WITH PROPOFOL;  Surgeon: Arnoldo Lenis, MD;  Location: [facility];  Service: Endoscopy;  Laterality: N/A;  . TEE WITHOUT CARDIOVERSION N/A 10/07/2014    Procedure: TRANSESOPHAGEAL ECHOCARDIOGRAM (TEE);  Surgeon: Rexene Alberts, MD;  Location: [place];  Service: Open Heart Surgery;  Laterality: N/A;           Family History  Problem Relation Age of Onset  . Hypertension Mother    . Hypertension Father    . Heart disease Father          before age 9  . Other Father          varicose veins  . COPD Father    . Colon cancer Neg Hx    . Celiac disease Neg Hx    . Inflammatory bowel disease Neg Hx      Social History         Tobacco Use  . Smoking status: Current Every Day Smoker      Packs/day: 0.50      Years: 40.00      Pack years: 20.00      Types: Cigarettes      Last attempt to quit: 06/10/2017      Years since quitting: 0.5  . Smokeless tobacco: Never Used  Substance Use Topics  . Alcohol use: Yes      Alcohol/week: 4.0 standard drinks      Types: 4 Cans of beer per week      Comment: 4 drinks per week  . Drug use: No           Allergies  Allergen Reactions  . Penicillins Hives      Has patient had a PCN reaction causing immediate rash, facial/tongue/throat swelling, SOB or lightheadedness with hypotension: Yes Has patient had a PCN reaction causing severe rash involving mucus membranes or skin necrosis: No Has patient had a PCN reaction that required hospitalization No Has patient had a PCN reaction occurring within the last 10 years: No If all of the above answers are "NO", then may proceed with Cephalosporin use.   Have taken Keflex before without any adverse reaction.   . Sulfa Antibiotics Nausea And Vomiting      Active Medications      Current Meds  Medication Sig  . aspirin 81 MG tablet Take 81 mg by mouth daily.  . Calcium Carb-Cholecalciferol (CALCIUM 600+D3) 600-800 MG-UNIT TABS Take 1 tablet by mouth every  morning.   . Cholecalciferol (VITAMIN D3) 5000 UNITS TABS Take 5,000 Units by mouth every morning.   . clonazePAM (KLONOPIN) 1 MG tablet    . Cranberry-Vitamin C-Vitamin E (CRANBERRY PLUS VITAMIN C) 4200-20-3 MG-MG-UNIT CAPS Take 1 capsule by mouth every morning.   [name]  diclofenac (VOLTAREN) 75 MG EC tablet Take 1 tablet (75 mg total) by mouth 2 (two) times daily.  . diclofenac (VOLTAREN) 75 MG EC tablet TAKE 1 TABLET BY MOUTH TWICE A DAY  . estradiol (ESTRACE) 1 MG tablet Take 1 mg by mouth every morning.   . furosemide (LASIX) 40 MG tablet Take 1 tablet (40 mg total) by mouth daily as needed for edema.  . Magnesium 250 MG TABS Take 250 mg by mouth every morning.   . medroxyPROGESTERone (PROVERA) 2.5 MG tablet Take 2.5 mg by mouth every morning.   . metoprolol tartrate (LOPRESSOR) 25 MG tablet TAKE 1 TABLET TWICE A DAY  . Multiple Vitamins-Minerals (MULTIVITAMIN WITH MINERALS) tablet Take 1 tablet by mouth every morning.   . Potassium 99 MG TABS Take 99 mg by mouth every morning. OTC  . RABEprazole (ACIPHEX) 20 MG tablet Take 20 mg by mouth daily.  . rosuvastatin (CRESTOR) 10 MG tablet    . zolpidem (AMBIEN) 10 MG tablet Take 10 mg by mouth at bedtime as needed for sleep.         BP 112/71   Pulse 80   Ht 5\' 6"  (1.676 m)   Wt 229 lb (103.9 kg)   BMI 36.96 kg/m    Physical Exam  Constitutional: She is oriented to person, place, and time. She appears well-developed and well-nourished.  Musculoskeletal:       Right knee: She exhibits no effusion.  Neurological: She is alert and oriented to person, place, and time.  Psychiatric: She has a normal mood and affect. Judgment normal.  Vitals reviewed.     Right Knee Exam    Muscle Strength  The patient has normal right knee strength.   Tenderness  The patient is experiencing tenderness in the medial joint line, MCL and patella.   Range of Motion  Extension: normal  Flexion: normal    Tests  McMurray:  Medial - positive Lateral -  negative Varus: negative Valgus: negative Drawer:  Anterior - negative    Posterior - negative   Other  Erythema: absent Scars: absent Sensation: normal Pulse: present Swelling: none Effusion: no effusion present     Left Knee Exam    Muscle Strength  The patient has normal left knee strength.   Tenderness  The patient is experiencing tenderness in the medial joint line and lateral joint line.   Range of Motion  Extension: normal  Flexion: normal    Tests  McMurray:  Medial - negative Lateral - negative Varus: negative Valgus: negative Drawer:  Anterior - negative     Posterior - negative   Other  Erythema: absent Scars: absent Sensation: normal Pulse: present Swelling: none               MEDICAL DECISION SECTION  Xrays were done at Office.  Plain films. Hospital MRI My independent reading of xrays:  Torn medial meniscus cartilage thinning patella and medial femoral condyle with osteoarthritis mild on x-ray       Encounter Diagnoses  Name Primary?  . Derangement of posterior horn of medial meniscus of right knee Yes  . Primary osteoarthritis of right knee    . Sprain of medial collateral ligament of right knee, subsequent encounter        PLAN: (Rx., injectx, surgery, frx, mri/ct) Arthroscopy right knee partial medial meniscectomy   MCL will heal on its own   Arthritic symptoms residual may be present and has been explained to the patient  The procedure has been fully reviewed with the patient; The risks and benefits of surgery have been discussed and explained and understood. Alternative treatment has also been reviewed, questions were encouraged and answered. The postoperative plan is also been reviewed.   No orders of the defined types were placed in this encounter.     Arther Abbott, MD

## 2018-01-10 ENCOUNTER — Ambulatory Visit (HOSPITAL_COMMUNITY): Payer: BLUE CROSS/BLUE SHIELD | Admitting: Anesthesiology

## 2018-01-10 ENCOUNTER — Encounter (HOSPITAL_COMMUNITY)
Admission: RE | Disposition: A | Discharge status: Home / Self Care | Payer: Self-pay | Source: Ambulatory Visit | Attending: Orthopedic Surgery

## 2018-01-10 ENCOUNTER — Ambulatory Visit (HOSPITAL_COMMUNITY)
Admission: RE | Admit: 2018-01-10 | Discharge: 2018-01-10 | Disposition: A | Discharge status: Home / Self Care | Payer: BLUE CROSS/BLUE SHIELD | Source: Ambulatory Visit | Attending: Orthopedic Surgery | Admitting: Orthopedic Surgery

## 2018-01-10 ENCOUNTER — Encounter (HOSPITAL_COMMUNITY): Payer: Self-pay

## 2018-01-10 DIAGNOSIS — Z952 Presence of prosthetic heart valve: Secondary | ICD-10-CM | POA: Diagnosis not present

## 2018-01-10 DIAGNOSIS — Z7982 Long term (current) use of aspirin: Secondary | ICD-10-CM | POA: Diagnosis not present

## 2018-01-10 DIAGNOSIS — K219 Gastro-esophageal reflux disease without esophagitis: Secondary | ICD-10-CM | POA: Diagnosis not present

## 2018-01-10 DIAGNOSIS — S83411A Sprain of medial collateral ligament of right knee, initial encounter: Secondary | ICD-10-CM | POA: Diagnosis not present

## 2018-01-10 DIAGNOSIS — Z882 Allergy status to sulfonamides status: Secondary | ICD-10-CM | POA: Insufficient documentation

## 2018-01-10 DIAGNOSIS — G4700 Insomnia, unspecified: Secondary | ICD-10-CM | POA: Diagnosis not present

## 2018-01-10 DIAGNOSIS — M23321 Other meniscus derangements, posterior horn of medial meniscus, right knee: Secondary | ICD-10-CM

## 2018-01-10 DIAGNOSIS — E785 Hyperlipidemia, unspecified: Secondary | ICD-10-CM | POA: Diagnosis not present

## 2018-01-10 DIAGNOSIS — I10 Essential (primary) hypertension: Secondary | ICD-10-CM | POA: Diagnosis not present

## 2018-01-10 DIAGNOSIS — F419 Anxiety disorder, unspecified: Secondary | ICD-10-CM | POA: Insufficient documentation

## 2018-01-10 DIAGNOSIS — M94261 Chondromalacia, right knee: Secondary | ICD-10-CM

## 2018-01-10 DIAGNOSIS — G4730 Sleep apnea, unspecified: Secondary | ICD-10-CM | POA: Diagnosis not present

## 2018-01-10 DIAGNOSIS — M2241 Chondromalacia patellae, right knee: Secondary | ICD-10-CM | POA: Diagnosis not present

## 2018-01-10 DIAGNOSIS — M1711 Unilateral primary osteoarthritis, right knee: Secondary | ICD-10-CM | POA: Insufficient documentation

## 2018-01-10 DIAGNOSIS — Z86718 Personal history of other venous thrombosis and embolism: Secondary | ICD-10-CM | POA: Insufficient documentation

## 2018-01-10 DIAGNOSIS — Z79899 Other long term (current) drug therapy: Secondary | ICD-10-CM | POA: Insufficient documentation

## 2018-01-10 DIAGNOSIS — I739 Peripheral vascular disease, unspecified: Secondary | ICD-10-CM | POA: Diagnosis not present

## 2018-01-10 DIAGNOSIS — Z8249 Family history of ischemic heart disease and other diseases of the circulatory system: Secondary | ICD-10-CM | POA: Insufficient documentation

## 2018-01-10 DIAGNOSIS — F17210 Nicotine dependence, cigarettes, uncomplicated: Secondary | ICD-10-CM | POA: Diagnosis not present

## 2018-01-10 DIAGNOSIS — I351 Nonrheumatic aortic (valve) insufficiency: Secondary | ICD-10-CM | POA: Insufficient documentation

## 2018-01-10 DIAGNOSIS — Z880 Allergy status to penicillin: Secondary | ICD-10-CM | POA: Insufficient documentation

## 2018-01-10 DIAGNOSIS — S83231A Complex tear of medial meniscus, current injury, right knee, initial encounter: Secondary | ICD-10-CM | POA: Insufficient documentation

## 2018-01-10 DIAGNOSIS — X58XXXA Exposure to other specified factors, initial encounter: Secondary | ICD-10-CM | POA: Insufficient documentation

## 2018-01-10 HISTORY — PX: KNEE ARTHROSCOPY WITH MEDIAL MENISECTOMY: SHX5651

## 2018-01-10 SURGERY — ARTHROSCOPY, KNEE, WITH MEDIAL MENISCECTOMY
Anesthesia: General | Site: Knee | Laterality: Right

## 2018-01-10 MED ORDER — IBUPROFEN 800 MG PO TABS
800.0000 mg | ORAL_TABLET | Freq: Once | ORAL | Status: AC
  Administered 2018-01-10: 800 mg via ORAL
  Filled 2018-01-10: qty 1

## 2018-01-10 MED ORDER — HYDROMORPHONE HCL 1 MG/ML IJ SOLN
0.2500 mg | INTRAMUSCULAR | Status: DC | PRN
  Administered 2018-01-10 (×2): 0.5 mg via INTRAVENOUS
  Filled 2018-01-10 (×2): qty 0.5

## 2018-01-10 MED ORDER — LIDOCAINE HCL (PF) 1 % IJ SOLN
INTRAMUSCULAR | Status: AC
  Filled 2018-01-10: qty 25

## 2018-01-10 MED ORDER — FENTANYL CITRATE (PF) 250 MCG/5ML IJ SOLN
INTRAMUSCULAR | Status: AC
  Filled 2018-01-10: qty 5

## 2018-01-10 MED ORDER — EPINEPHRINE PF 1 MG/ML IJ SOLN
INTRAMUSCULAR | Status: AC
  Filled 2018-01-10: qty 1

## 2018-01-10 MED ORDER — SODIUM CHLORIDE 0.9 % IR SOLN
Status: DC | PRN
  Administered 2018-01-10: 1000 mL

## 2018-01-10 MED ORDER — SODIUM CHLORIDE 0.9 % IR SOLN
Status: DC | PRN
  Administered 2018-01-10 (×4): 3000 mL

## 2018-01-10 MED ORDER — MEPERIDINE HCL 50 MG/ML IJ SOLN: 6.2500 mg | INTRAMUSCULAR | Status: DC | PRN

## 2018-01-10 MED ORDER — LIDOCAINE HCL 1 % IJ SOLN
INTRAMUSCULAR | Status: DC | PRN
  Administered 2018-01-10: 50 mg via INTRADERMAL

## 2018-01-10 MED ORDER — LACTATED RINGERS IV SOLN
INTRAVENOUS | Status: DC
  Administered 2018-01-10: 08:00:00 via INTRAVENOUS

## 2018-01-10 MED ORDER — ONDANSETRON HCL 4 MG/2ML IJ SOLN: 4.0000 mg | Freq: Once | INTRAMUSCULAR | Status: DC | PRN

## 2018-01-10 MED ORDER — EPINEPHRINE PF 1 MG/ML IJ SOLN
INTRAMUSCULAR | Status: AC
  Filled 2018-01-10: qty 3

## 2018-01-10 MED ORDER — HYDROCODONE-ACETAMINOPHEN 5-325 MG PO TABS: 1.0000 | ORAL_TABLET | ORAL | 0 refills | Status: DC | PRN

## 2018-01-10 MED ORDER — BUPIVACAINE-EPINEPHRINE (PF) 0.5% -1:200000 IJ SOLN
INTRAMUSCULAR | Status: AC
  Filled 2018-01-10: qty 60

## 2018-01-10 MED ORDER — MIDAZOLAM HCL 5 MG/5ML IJ SOLN
INTRAMUSCULAR | Status: DC | PRN
  Administered 2018-01-10: 2 mg via INTRAVENOUS

## 2018-01-10 MED ORDER — HYDROCODONE-ACETAMINOPHEN 7.5-325 MG PO TABS: 1.0000 | ORAL_TABLET | Freq: Once | ORAL | Status: DC | PRN

## 2018-01-10 MED ORDER — ONDANSETRON HCL 4 MG/2ML IJ SOLN
INTRAMUSCULAR | Status: DC | PRN
  Administered 2018-01-10: 4 mg via INTRAVENOUS

## 2018-01-10 MED ORDER — MIDAZOLAM HCL 2 MG/2ML IJ SOLN
INTRAMUSCULAR | Status: AC
  Filled 2018-01-10: qty 2

## 2018-01-10 MED ORDER — BUPIVACAINE-EPINEPHRINE (PF) 0.5% -1:200000 IJ SOLN
INTRAMUSCULAR | Status: DC | PRN
  Administered 2018-01-10 (×2): 30 mL

## 2018-01-10 MED ORDER — ONDANSETRON HCL 4 MG/2ML IJ SOLN
INTRAMUSCULAR | Status: AC
  Filled 2018-01-10: qty 2

## 2018-01-10 MED ORDER — GLYCOPYRROLATE 0.2 MG/ML IJ SOLN
INTRAMUSCULAR | Status: AC
  Filled 2018-01-10: qty 2

## 2018-01-10 MED ORDER — VANCOMYCIN HCL 10 G IV SOLR: 1500.0000 mg | INTRAVENOUS | Status: DC

## 2018-01-10 MED ORDER — ONDANSETRON HCL 4 MG/2ML IJ SOLN: 4.0000 mg | Freq: Once | INTRAMUSCULAR | Status: DC

## 2018-01-10 MED ORDER — CHLORHEXIDINE GLUCONATE 4 % EX LIQD: 60.0000 mL | Freq: Once | CUTANEOUS | Status: DC

## 2018-01-10 MED ORDER — FENTANYL CITRATE (PF) 100 MCG/2ML IJ SOLN
INTRAMUSCULAR | Status: DC | PRN
  Administered 2018-01-10 (×2): 50 ug via INTRAVENOUS
  Administered 2018-01-10 (×4): 25 ug via INTRAVENOUS
  Administered 2018-01-10: 50 ug via INTRAVENOUS

## 2018-01-10 MED ORDER — VANCOMYCIN HCL 10 G IV SOLR
INTRAVENOUS | Status: AC
  Filled 2018-01-10: qty 1500

## 2018-01-10 MED ORDER — KETOROLAC TROMETHAMINE 30 MG/ML IJ SOLN
30.0000 mg | Freq: Once | INTRAMUSCULAR | Status: AC | PRN
  Administered 2018-01-10: 30 mg via INTRAVENOUS
  Filled 2018-01-10: qty 1

## 2018-01-10 MED ORDER — HYDROCODONE-ACETAMINOPHEN 7.5-325 MG PO TABS
1.0000 | ORAL_TABLET | Freq: Once | ORAL | Status: AC
  Administered 2018-01-10: 1 via ORAL
  Filled 2018-01-10: qty 1

## 2018-01-10 MED ORDER — ONDANSETRON HCL 4 MG/2ML IJ SOLN
INTRAMUSCULAR | Status: AC
  Filled 2018-01-10: qty 4

## 2018-01-10 MED ORDER — PROPOFOL 10 MG/ML IV BOLUS
INTRAVENOUS | Status: AC
  Filled 2018-01-10: qty 40

## 2018-01-10 SURGICAL SUPPLY — 57 items
ARTHROWAND PARAGON T2 (SURGICAL WAND)
BANDAGE ELASTIC 6 LF NS (GAUZE/BANDAGES/DRESSINGS) ×3 IMPLANT
BLADE AGGRESSIVE PLUS 4.0 (BLADE) ×3 IMPLANT
BLADE SURG SZ11 CARB STEEL (BLADE) ×3 IMPLANT
BNDG CMPR MED 5X6 ELC HKLP NS (GAUZE/BANDAGES/DRESSINGS) ×1
CHLORAPREP W/TINT 26ML (MISCELLANEOUS) ×3 IMPLANT
CLOTH BEACON ORANGE TIMEOUT ST (SAFETY) ×3 IMPLANT
COOLER CRYO IC GRAV AND TUBE (ORTHOPEDIC SUPPLIES) ×3 IMPLANT
CUFF CRYO KNEE LG 20X31 COOLER (ORTHOPEDIC SUPPLIES) IMPLANT
CUFF CRYO KNEE18X23 MED (MISCELLANEOUS) ×2 IMPLANT
CUFF TOURNIQUET SINGLE 34IN LL (TOURNIQUET CUFF) ×2 IMPLANT
CUTTER ANGLED AGGR PLUS 4.0 (BURR) ×2 IMPLANT
CUTTER ANGLED DBL BITE 4.5 (BURR) IMPLANT
DECANTER SPIKE VIAL GLASS SM (MISCELLANEOUS) ×6 IMPLANT
GAUZE 4X4 16PLY RFD (DISPOSABLE) ×3 IMPLANT
GAUZE SPONGE 4X4 12PLY STRL (GAUZE/BANDAGES/DRESSINGS) ×3 IMPLANT
GAUZE SPONGE 4X4 16PLY XRAY LF (GAUZE/BANDAGES/DRESSINGS) ×3 IMPLANT
GAUZE XEROFORM 5X9 LF (GAUZE/BANDAGES/DRESSINGS) ×3 IMPLANT
GLOVE BIOGEL M 7.0 STRL (GLOVE) ×2 IMPLANT
GLOVE BIOGEL PI IND STRL 7.0 (GLOVE) ×2 IMPLANT
GLOVE BIOGEL PI INDICATOR 7.0 (GLOVE) ×4
GLOVE SKINSENSE NS SZ8.0 LF (GLOVE) ×2
GLOVE SKINSENSE STRL SZ8.0 LF (GLOVE) ×1 IMPLANT
GLOVE SS N UNI LF 8.5 STRL (GLOVE) ×3 IMPLANT
GOWN STRL REUS W/ TWL LRG LVL3 (GOWN DISPOSABLE) ×1 IMPLANT
GOWN STRL REUS W/TWL LRG LVL3 (GOWN DISPOSABLE) ×3
GOWN STRL REUS W/TWL XL LVL3 (GOWN DISPOSABLE) ×3 IMPLANT
HLDR LEG FOAM (MISCELLANEOUS) ×1 IMPLANT
IV NS IRRIG 3000ML ARTHROMATIC (IV SOLUTION) ×10 IMPLANT
KIT BLADEGUARD II DBL (SET/KITS/TRAYS/PACK) ×3 IMPLANT
KIT TURNOVER CYSTO (KITS) ×3 IMPLANT
LEG HOLDER FOAM (MISCELLANEOUS) ×2
MANIFOLD NEPTUNE II (INSTRUMENTS) ×3 IMPLANT
MARKER SKIN DUAL TIP RULER LAB (MISCELLANEOUS) ×3 IMPLANT
NDL HYPO 18GX1.5 BLUNT FILL (NEEDLE) ×1 IMPLANT
NDL HYPO 21X1.5 SAFETY (NEEDLE) ×1 IMPLANT
NDL SPNL 18GX3.5 QUINCKE PK (NEEDLE) ×1 IMPLANT
NEEDLE HYPO 18GX1.5 BLUNT FILL (NEEDLE) ×3 IMPLANT
NEEDLE HYPO 21X1.5 SAFETY (NEEDLE) ×3 IMPLANT
NEEDLE SPNL 18GX3.5 QUINCKE PK (NEEDLE) ×3 IMPLANT
NS IRRIG 1000ML POUR BTL (IV SOLUTION) ×3 IMPLANT
PACK ARTHRO LIMB DRAPE STRL (MISCELLANEOUS) ×3 IMPLANT
PAD ABD 5X9 TENDERSORB (GAUZE/BANDAGES/DRESSINGS) ×3 IMPLANT
PAD ARMBOARD 7.5X6 YLW CONV (MISCELLANEOUS) ×3 IMPLANT
PADDING CAST COTTON 6X4 STRL (CAST SUPPLIES) ×3 IMPLANT
PROBE BIPOLAR 50 DEGREE SUCT (MISCELLANEOUS) ×2 IMPLANT
PROBE BIPOLAR ATHRO 135MM 90D (MISCELLANEOUS) IMPLANT
SET ARTHROSCOPY INST (INSTRUMENTS) ×3 IMPLANT
SET BASIN LINEN APH (SET/KITS/TRAYS/PACK) ×3 IMPLANT
SUT ETHILON 3 0 FSL (SUTURE) ×3 IMPLANT
SYR 10ML LL (SYRINGE) ×3 IMPLANT
SYR 30ML LL (SYRINGE) ×3 IMPLANT
TUBE CONNECTING 12'X1/4 (SUCTIONS) ×2
TUBE CONNECTING 12X1/4 (SUCTIONS) ×4 IMPLANT
TUBING ARTHRO INFLOW-ONLY STRL (TUBING) ×3 IMPLANT
WAND ARTHRO PARAGON T2 (SURGICAL WAND) IMPLANT
WHISKER CUTTER 4 (BUR) ×2 IMPLANT

## 2018-01-10 NOTE — Interval H&P Note (Signed)
History and Physical Interval Note:  01/10/2018 7:22 AM  Michelle Patton  has presented today for surgery, with the diagnosis of torn medial meniscus right knee  The various methods of treatment have been discussed with the patient and family. After consideration of risks, benefits and other options for treatment, the patient has consented to  Procedure(s): KNEE ARTHROSCOPY WITH MEDIAL MENISECTOMY (Right) as a surgical intervention .  The patient's history has been reviewed, patient examined, no change in status, stable for surgery.  I have reviewed the patient's chart and labs.  Questions were answered to the patient's satisfaction.     Arther Abbott

## 2018-01-10 NOTE — Anesthesia Preprocedure Evaluation (Signed)
Anesthesia Evaluation  Patient identified by MRN, date of birth, ID band Patient awake    Reviewed: Allergy & Precautions, H&P , NPO status , Patient's Chart, lab work & pertinent test results  Airway Mallampati: II  TM Distance: >3 FB Neck ROM: full    Dental no notable dental hx.    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     Plan Discussed with: CRNA  Anesthesia Plan Comments:         Anesthesia Quick Evaluation

## 2018-01-10 NOTE — Anesthesia Procedure Notes (Signed)
Procedure Name: LMA Insertion Date/Time: 01/10/2018 8:42 AM Performed by: Ollen Bowl, CRNA Pre-anesthesia Checklist: Patient identified, Patient being monitored, Emergency Drugs available, Timeout performed and Suction available Patient Re-evaluated:Patient Re-evaluated prior to induction Oxygen Delivery Method: Circle System Utilized Preoxygenation: Pre-oxygenation with 100% oxygen Induction Type: IV induction Ventilation: Mask ventilation without difficulty LMA: LMA inserted LMA Size: 4.0 Number of attempts: 1 Placement Confirmation: positive ETCO2 and breath sounds checked- equal and bilateral

## 2018-01-10 NOTE — Brief Op Note (Addendum)
01/10/2018  9:46 AM  PATIENT:  Michelle Patton  62 y.o. female  PRE-OPERATIVE DIAGNOSIS:  torn medial meniscus right knee  POST-OPERATIVE DIAGNOSIS:  torn medial meniscus right knee  FINDINGS: Arthritis of the trochlea grade 3 patella grade 2 lateral compartment normal Notch normal arthritis diffuse grade 2 medial femoral condyle complex tear posterior horn medial meniscus with the root portion flipped over the condyle  PROCEDURE:  Procedure(s): RIGHT KNEE ARTHROSCOPY WITH PARTIAL MEDIAL MENISECTOMY (Right) - 29021  SURGEON:  Surgeon(s) and Role:    Carole Civil, MD - Primary  PHYSICIAN ASSISTANT:   ASSISTANTS: none   ANESTHESIA:   general  EBL:  0 mL   BLOOD ADMINISTERED:none  DRAINS: none   LOCAL MEDICATIONS USED:  MARCAINE     SPECIMEN:  No Specimen  DISPOSITION OF SPECIMEN:  N/A  COUNTS:  YES  TOURNIQUET:  * Missing tourniquet times found for documented tourniquets in log: 115520 *  DICTATION: .Dragon Dictation  PLAN OF CARE: Discharge to home after PACU  PATIENT DISPOSITION:  PACU - hemodynamically stable.   Delay start of Pharmacological VTE agent (>24hrs) due to surgical blood loss or risk of bleeding: not applicable

## 2018-01-10 NOTE — Transfer of Care (Signed)
Immediate Anesthesia Transfer of Care Note  Patient: Michelle Patton  Procedure(s) Performed: RIGHT KNEE ARTHROSCOPY WITH PARTIAL MEDIAL MENISECTOMY (Right Knee)  Patient Location: PACU  Anesthesia Type:MAC  Level of Consciousness: awake and alert   Airway & Oxygen Therapy: Patient Spontanous Breathing and Patient connected to face mask oxygen  Post-op Assessment: Report given to RN  Post vital signs: Reviewed and stable  Last Vitals:  Vitals Value Taken Time  BP 153/83 01/10/2018  9:54 AM  Temp    Pulse 72 01/10/2018  9:58 AM  Resp 13 01/10/2018  9:58 AM  SpO2 86 % 01/10/2018  9:58 AM  Vitals shown include unvalidated device data.  Last Pain:  Vitals:   01/10/18 0727  TempSrc: Oral  PainSc: 0-No pain         Complications: No apparent anesthesia complications

## 2018-01-10 NOTE — Anesthesia Postprocedure Evaluation (Signed)
Anesthesia Post Note  Patient: Michelle Patton  Procedure(s) Performed: RIGHT KNEE ARTHROSCOPY WITH PARTIAL MEDIAL MENISECTOMY (Right Knee)  Patient location during evaluation: PACU Anesthesia Type: General Level of consciousness: awake and alert and oriented Pain management: pain level controlled Vital Signs Assessment: post-procedure vital signs reviewed and stable Respiratory status: spontaneous breathing Cardiovascular status: blood pressure returned to baseline and stable Postop Assessment: no apparent nausea or vomiting and adequate PO intake Anesthetic complications: no     Last Vitals:  Vitals:   01/10/18 1015 01/10/18 1030  BP: (!) 144/68 (!) 146/66  Pulse: 66 70  Resp: 10 13  Temp:    SpO2: 98% 100%    Last Pain:  Vitals:   01/10/18 1015  TempSrc:   PainSc: 6                  Odalis Jordan

## 2018-01-10 NOTE — Discharge Instructions (Signed)
General Anesthesia, Adult, Care After These instructions provide you with information about caring for yourself after your procedure. Your health care provider may also give you more specific instructions. Your treatment has been planned according to current medical practices, but problems sometimes occur. Call your health care provider if you have any problems or questions after your procedure. What can I expect after the procedure? After the procedure, it is common to have:  Vomiting.  A sore throat.  Mental slowness.  It is common to feel:  Nauseous.  Cold or shivery.  Sleepy.  Tired.  Sore or achy, even in parts of your body where you did not have surgery.  Follow these instructions at home: For at least 24 hours after the procedure:  Do not: ? Participate in activities where you could fall or become injured. ? Drive. ? Use heavy machinery. ? Drink alcohol. ? Take sleeping pills or medicines that cause drowsiness. ? Make important decisions or sign legal documents. ? Take care of children on your own.  Rest. Eating and drinking  If you vomit, drink water, juice, or soup when you can drink without vomiting.  Drink enough fluid to keep your urine clear or pale yellow.  Make sure you have little or no nausea before eating solid foods.  Follow the diet recommended by your health care provider. General instructions  Have a responsible adult stay with you until you are awake and alert.  Return to your normal activities as told by your health care provider. Ask your health care provider what activities are safe for you.  Take over-the-counter and prescription medicines only as told by your health care provider.  If you smoke, do not smoke without supervision.  Keep all follow-up visits as told by your health care provider. This is important. Contact a health care provider if:  You continue to have nausea or vomiting at home, and medicines are  not helpful.  You cannot drink fluids or start eating again.  You cannot urinate after 8-12 hours.  You develop a skin rash.  You have fever.  You have increasing redness at the site of your procedure. Get help right away if:  You have difficulty breathing.  You have chest pain.  You have unexpected bleeding.  You feel that you are having a life-threatening or urgent problem. This information is not intended to replace advice given to you by your health care provider. Make sure you discuss any questions you have with your health care provider. Document Released: 07/17/2000 Document Revised: 09/13/2015 Document Reviewed: 03/25/2015 Elsevier Interactive Patient Education  2018 [place] Use, Adult Crutches are used to take weight off of one of your legs or feet when you stand or walk. You may need crutches to help heal after an injury or procedure. It is important to use crutches that fit right. Your crutches fit right if:  You can fit 2 or 3 fingers between your armpit and the crutch.  You use your hands, not your armpits, to hold yourself up.  Do not put your armpits on the crutches. This can damage the nerves in your shoulders, arms, back, armpits, and hands. It is important that a doctor has seen you use crutches the right way before you use them at home. How to use your crutches How you will use your crutches will depend on why you need them. Your doctor may tell you not to  put weight on (not to support your weight with) your hurt leg (non-weight-bearing). Or, your doctor may let you put (bear) some of your weight on the hurt leg (partial weight-bearing), but not all of your weight. Follow instructions from your doctor about weight-bearing. Do not put weight on your leg in an amount that causes pain. Walking 1. Stand on your good leg and lift both crutches at the same time. 2. Place the crutches one step-length in front of you. 3. Bring the good leg  forward to meet the crutches or to land a little bit ahead of them. 4. Repeat. Going up steps If there is no handrail: 1. Step up with your good leg. 2. Step up with the crutches and your hurt leg. 3. Repeat.  If there is a handrail: 1. Hold both crutches in one hand. 2. Place your other hand on the handrail. 3. Put your weight on your arms and lift your good leg up to the step. 4. Bring the crutches and the hurt leg up to that step. 5. Repeat.  Going up steps on your butt If you do not feel steady on steps, you can go up steps on your butt. 1. Sit on the lowest step. ? Have your hurt leg out in front. ? Use your other hand to hold both crutches flat on the stairs. 2. Scoot your butt up to the next step. Use the free hand and your good leg to help you do this.  Going down steps If there is no handrail: 1. Step down with your hurt leg and crutches. 2. Step down with your good leg. 3. Repeat.  If there is a handrail: 1. Place your hand on the handrail. 2. Hold both crutches with your free hand. 3. Lower your hurt leg and crutch to the step below you. Keep the crutch tips in the center of the step. Never put the crutch tips on the edge of the step. 4. Lower your good leg to that step. 5. Repeat.  Going down steps on your butt If you do not feel steady on steps, you can go down steps on your butt. 1. Sit on the highest step. ? Have your hurt leg out in front. ? Use your other hand to hold both crutches flat on the stairs. 2. Scoot your butt down to the next step. Use the free hand and your good leg to help you do this.  Standing up 1. Hold the hurt leg forward. 2. Grab the armrest with one hand. Use the other hand to grab the top of the crutches. 3. Use the armrest and your crutches to pull yourself up to stand. Sitting down 1. Hold the hurt leg forward. 2. Grab the armrest with one hand. Use the other hand to grab the top of the crutches. 3. Slowly lower yourself to  sit. Get help if:  You feel unsteady or wobbly using crutches.  You have any new pain.  You cannot feel a part of your body (numbness) or you have a tingling feeling.  Your crutches do not fit. Get help right away if:  You fall. This information is not intended to replace advice given to you by your health care provider. Make sure you discuss any questions you have with your health care provider. Document Released: 09/27/2007 Document Revised: 11/12/2015 Document Reviewed: 10/01/2015 Elsevier Interactive Patient Education  [employer].

## 2018-01-10 NOTE — Op Note (Signed)
01/10/2018  9:46 AM  PATIENT:  Michelle Patton  62 y.o. 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 The chart was reviewed and updated. The surgical site was confirmed as right knee and marked with an indelible marker.  The patient was taken to the operating room for anesthesia. After successful general anesthesia, vancomycin was used as IV antibiotics.  The patient was placed in the supine position with the (right knee) the operative extremity in an arthroscopic leg holder and the opposite extremity in a padded leg holder.  The timeout was executed.  A lateral portal was established with an 11 blade and the scope was introduced into the joint. A diagnostic arthroscopy was performed in circumferential manner examining the entire knee joint. A medial portal was established and the diagnostic arthroscopy was repeated using a probe to palpate intra-articular structures as they were encountered.    The MEDIAL meniscus was resected using a duckbill forceps.  A portion of the root was flipped over the condyle and had to be pulled back into place and continued to try to flip back over the tibial plateau.  This required a combination of instruments including several different meniscal biters along with a curved shaver straight shaver and ArthroCare wand.  We also use the whisker device to perform chondroplasty on the medial femoral condyle as well as  the trochlea  The meniscal fragments were removed with a motorized shaver. The meniscus was balanced with a combination of a motorized shaver and a 50 ArthroCare wand until a stable rim was obtained.  The arthroscopic pump was placed on the wash mode and any excess debris was removed from the joint using suction.  60 cc of Marcaine with epinephrine was injected through the arthroscope.  The portals were closed with 3-0 nylon suture.  A sterile bandage, Ace wrap and Cryo/Cuff was placed and the Cryo/Cuff was activated. The patient was taken to the recovery room in stable condition.   SURGEON:  Surgeon(s) and Role:    * Carole Civil, MD - Primary  PHYSICIAN ASSISTANT:   ASSISTANTS: none   ANESTHESIA:   general  EBL:  0 mL   BLOOD ADMINISTERED:none  DRAINS: none   LOCAL MEDICATIONS USED:  MARCAINE     SPECIMEN:  No Specimen  DISPOSITION OF SPECIMEN:  N/A  COUNTS:  YES  TOURNIQUET:  * Missing tourniquet times found for documented tourniquets in log: [id number] *  DICTATION: .Dragon Dictation  PLAN OF CARE: Discharge to home after PACU  PATIENT DISPOSITION:  PACU - hemodynamically stable.   Delay start of Pharmacological VTE agent (>24hrs) due to surgical blood loss or risk of bleeding: not applicable

## 2018-01-11 ENCOUNTER — Encounter (HOSPITAL_COMMUNITY): Payer: Self-pay | Admitting: Orthopedic Surgery

## 2018-01-13 ENCOUNTER — Other Ambulatory Visit: Payer: Self-pay | Admitting: Cardiology

## 2018-01-14 ENCOUNTER — Encounter (HOSPITAL_BASED_OUTPATIENT_CLINIC_OR_DEPARTMENT_OTHER): Payer: Self-pay | Admitting: *Deleted

## 2018-01-14 ENCOUNTER — Other Ambulatory Visit: Payer: Self-pay

## 2018-01-15 NOTE — Progress Notes (Signed)
Patient is scheduled for Bilateral Breast Reduction with Dr Towanda Malkin 01-21-18. She recently had a knee arthroscopy 01-10-18 at [facility] and had cardiac clearance for that surgery. Chart was reviewed by Dr Gifford Shave and patient is cleared for [facility].

## 2018-01-18 ENCOUNTER — Ambulatory Visit (INDEPENDENT_AMBULATORY_CARE_PROVIDER_SITE_OTHER): Payer: BLUE CROSS/BLUE SHIELD | Admitting: Orthopedic Surgery

## 2018-01-18 ENCOUNTER — Encounter: Payer: Self-pay | Admitting: Orthopedic Surgery

## 2018-01-18 DIAGNOSIS — Z9889 Other specified postprocedural states: Secondary | ICD-10-CM

## 2018-01-18 NOTE — Patient Instructions (Signed)
Continue the range of motion exercises  Continue the ice therapy  Follow-up in 3 weeks

## 2018-01-18 NOTE — Progress Notes (Signed)
Chief Complaint  Patient presents with  . Knee Pain    Right knee DOS 01/10/18   01/10/2018   9:46 AM   PATIENT:  Michelle Patton  62 y.o. 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 is doing well after her knee arthroscopy her range of motion is 0 to 100 degrees she is walking unsupported  Recommend continue ice and range of motion exercises follow-up with me in 3 weeks

## 2018-01-21 ENCOUNTER — Ambulatory Visit (HOSPITAL_BASED_OUTPATIENT_CLINIC_OR_DEPARTMENT_OTHER): Payer: BLUE CROSS/BLUE SHIELD | Admitting: Anesthesiology

## 2018-01-21 ENCOUNTER — Other Ambulatory Visit: Payer: Self-pay

## 2018-01-21 ENCOUNTER — Encounter (HOSPITAL_BASED_OUTPATIENT_CLINIC_OR_DEPARTMENT_OTHER)
Admission: RE | Disposition: A | Discharge status: Home / Self Care | Payer: Self-pay | Source: Ambulatory Visit | Attending: Specialist

## 2018-01-21 ENCOUNTER — Ambulatory Visit (HOSPITAL_BASED_OUTPATIENT_CLINIC_OR_DEPARTMENT_OTHER)
Admission: RE | Admit: 2018-01-21 | Discharge: 2018-01-21 | Disposition: A | Discharge status: Home / Self Care | Payer: BLUE CROSS/BLUE SHIELD | Source: Ambulatory Visit | Attending: Specialist | Admitting: Specialist

## 2018-01-21 ENCOUNTER — Encounter (HOSPITAL_BASED_OUTPATIENT_CLINIC_OR_DEPARTMENT_OTHER): Payer: Self-pay

## 2018-01-21 DIAGNOSIS — K219 Gastro-esophageal reflux disease without esophagitis: Secondary | ICD-10-CM | POA: Insufficient documentation

## 2018-01-21 DIAGNOSIS — Z881 Allergy status to other antibiotic agents status: Secondary | ICD-10-CM | POA: Diagnosis not present

## 2018-01-21 DIAGNOSIS — L304 Erythema intertrigo: Secondary | ICD-10-CM | POA: Insufficient documentation

## 2018-01-21 DIAGNOSIS — Z952 Presence of prosthetic heart valve: Secondary | ICD-10-CM | POA: Insufficient documentation

## 2018-01-21 DIAGNOSIS — E785 Hyperlipidemia, unspecified: Secondary | ICD-10-CM | POA: Insufficient documentation

## 2018-01-21 DIAGNOSIS — Z882 Allergy status to sulfonamides status: Secondary | ICD-10-CM | POA: Diagnosis not present

## 2018-01-21 DIAGNOSIS — F419 Anxiety disorder, unspecified: Secondary | ICD-10-CM | POA: Diagnosis not present

## 2018-01-21 DIAGNOSIS — N62 Hypertrophy of breast: Secondary | ICD-10-CM | POA: Diagnosis not present

## 2018-01-21 DIAGNOSIS — M549 Dorsalgia, unspecified: Secondary | ICD-10-CM | POA: Diagnosis not present

## 2018-01-21 DIAGNOSIS — I739 Peripheral vascular disease, unspecified: Secondary | ICD-10-CM | POA: Diagnosis not present

## 2018-01-21 DIAGNOSIS — Z981 Arthrodesis status: Secondary | ICD-10-CM | POA: Diagnosis not present

## 2018-01-21 DIAGNOSIS — Z95820 Peripheral vascular angioplasty status with implants and grafts: Secondary | ICD-10-CM | POA: Insufficient documentation

## 2018-01-21 DIAGNOSIS — Z86718 Personal history of other venous thrombosis and embolism: Secondary | ICD-10-CM | POA: Insufficient documentation

## 2018-01-21 DIAGNOSIS — M1990 Unspecified osteoarthritis, unspecified site: Secondary | ICD-10-CM | POA: Diagnosis not present

## 2018-01-21 DIAGNOSIS — Z825 Family history of asthma and other chronic lower respiratory diseases: Secondary | ICD-10-CM | POA: Insufficient documentation

## 2018-01-21 DIAGNOSIS — Z7982 Long term (current) use of aspirin: Secondary | ICD-10-CM | POA: Diagnosis not present

## 2018-01-21 DIAGNOSIS — Z8249 Family history of ischemic heart disease and other diseases of the circulatory system: Secondary | ICD-10-CM | POA: Insufficient documentation

## 2018-01-21 DIAGNOSIS — Z86010 Personal history of colonic polyps: Secondary | ICD-10-CM | POA: Diagnosis not present

## 2018-01-21 DIAGNOSIS — R531 Weakness: Secondary | ICD-10-CM | POA: Diagnosis not present

## 2018-01-21 DIAGNOSIS — M25519 Pain in unspecified shoulder: Secondary | ICD-10-CM | POA: Diagnosis not present

## 2018-01-21 DIAGNOSIS — Z6835 Body mass index (BMI) 35.0-35.9, adult: Secondary | ICD-10-CM | POA: Insufficient documentation

## 2018-01-21 DIAGNOSIS — E669 Obesity, unspecified: Secondary | ICD-10-CM | POA: Insufficient documentation

## 2018-01-21 DIAGNOSIS — F17210 Nicotine dependence, cigarettes, uncomplicated: Secondary | ICD-10-CM | POA: Insufficient documentation

## 2018-01-21 DIAGNOSIS — Z79899 Other long term (current) drug therapy: Secondary | ICD-10-CM | POA: Diagnosis not present

## 2018-01-21 DIAGNOSIS — Z9049 Acquired absence of other specified parts of digestive tract: Secondary | ICD-10-CM | POA: Insufficient documentation

## 2018-01-21 DIAGNOSIS — G4730 Sleep apnea, unspecified: Secondary | ICD-10-CM | POA: Diagnosis not present

## 2018-01-21 DIAGNOSIS — I071 Rheumatic tricuspid insufficiency: Secondary | ICD-10-CM | POA: Insufficient documentation

## 2018-01-21 DIAGNOSIS — I10 Essential (primary) hypertension: Secondary | ICD-10-CM | POA: Insufficient documentation

## 2018-01-21 DIAGNOSIS — G4700 Insomnia, unspecified: Secondary | ICD-10-CM | POA: Diagnosis not present

## 2018-01-21 HISTORY — PX: BREAST REDUCTION SURGERY: SHX8

## 2018-01-21 SURGERY — BREAST REDUCTION WITH LIPOSUCTION
Anesthesia: General | Site: Breast | Laterality: Bilateral

## 2018-01-21 MED ORDER — LACTATED RINGERS IV SOLN
INTRAVENOUS | Status: DC
  Administered 2018-01-21 (×2): via INTRAVENOUS

## 2018-01-21 MED ORDER — MIDAZOLAM HCL 2 MG/2ML IJ SOLN
INTRAMUSCULAR | Status: AC
  Filled 2018-01-21: qty 2

## 2018-01-21 MED ORDER — CEFAZOLIN SODIUM-DEXTROSE 2-4 GM/100ML-% IV SOLN
INTRAVENOUS | Status: AC
  Filled 2018-01-21: qty 100

## 2018-01-21 MED ORDER — LIDOCAINE HCL 2 % IJ SOLN
INTRAMUSCULAR | Status: DC | PRN
  Administered 2018-01-21: 100 mL

## 2018-01-21 MED ORDER — FENTANYL CITRATE (PF) 100 MCG/2ML IJ SOLN
INTRAMUSCULAR | Status: AC
  Filled 2018-01-21: qty 2

## 2018-01-21 MED ORDER — DEXAMETHASONE SODIUM PHOSPHATE 10 MG/ML IJ SOLN
INTRAMUSCULAR | Status: AC
  Filled 2018-01-21: qty 1

## 2018-01-21 MED ORDER — HYDROMORPHONE HCL 1 MG/ML IJ SOLN
0.2500 mg | INTRAMUSCULAR | Status: DC | PRN
  Administered 2018-01-21 (×2): 0.5 mg via INTRAVENOUS

## 2018-01-21 MED ORDER — PROPOFOL 10 MG/ML IV BOLUS
INTRAVENOUS | Status: DC | PRN
  Administered 2018-01-21: 150 mg via INTRAVENOUS

## 2018-01-21 MED ORDER — FENTANYL CITRATE (PF) 100 MCG/2ML IJ SOLN
50.0000 ug | INTRAMUSCULAR | Status: AC | PRN
  Administered 2018-01-21 (×6): 50 ug via INTRAVENOUS

## 2018-01-21 MED ORDER — CHLORHEXIDINE GLUCONATE CLOTH 2 % EX PADS: 6.0000 | MEDICATED_PAD | Freq: Once | CUTANEOUS | Status: DC

## 2018-01-21 MED ORDER — EPINEPHRINE PF 1 MG/ML IJ SOLN
INTRAMUSCULAR | Status: DC | PRN
  Administered 2018-01-21: 1 mg

## 2018-01-21 MED ORDER — LIDOCAINE 2% (20 MG/ML) 5 ML SYRINGE
INTRAMUSCULAR | Status: AC
  Filled 2018-01-21: qty 5

## 2018-01-21 MED ORDER — ROCURONIUM BROMIDE 100 MG/10ML IV SOLN
INTRAVENOUS | Status: DC | PRN
  Administered 2018-01-21: 50 mg via INTRAVENOUS

## 2018-01-21 MED ORDER — HYDROCODONE-ACETAMINOPHEN 7.5-325 MG PO TABS: 1.0000 | ORAL_TABLET | Freq: Once | ORAL | Status: DC | PRN

## 2018-01-21 MED ORDER — EPHEDRINE SULFATE 50 MG/ML IJ SOLN
INTRAMUSCULAR | Status: DC | PRN
  Administered 2018-01-21 (×3): 10 mg via INTRAVENOUS

## 2018-01-21 MED ORDER — ONDANSETRON HCL 4 MG/2ML IJ SOLN
INTRAMUSCULAR | Status: AC
  Filled 2018-01-21: qty 2

## 2018-01-21 MED ORDER — DEXAMETHASONE SODIUM PHOSPHATE 4 MG/ML IJ SOLN
INTRAMUSCULAR | Status: DC | PRN
  Administered 2018-01-21: 10 mg via INTRAVENOUS

## 2018-01-21 MED ORDER — LACTATED RINGERS IV SOLN
INTRAVENOUS | Status: AC | PRN
  Administered 2018-01-21: 1000 mL via INTRAVENOUS

## 2018-01-21 MED ORDER — HYDROMORPHONE HCL 1 MG/ML IJ SOLN
INTRAMUSCULAR | Status: AC
  Filled 2018-01-21: qty 0.5

## 2018-01-21 MED ORDER — ONDANSETRON HCL 4 MG/2ML IJ SOLN
INTRAMUSCULAR | Status: DC | PRN
  Administered 2018-01-21 (×2): 4 mg via INTRAVENOUS

## 2018-01-21 MED ORDER — METOCLOPRAMIDE HCL 5 MG/ML IJ SOLN: 10.0000 mg | Freq: Once | INTRAMUSCULAR | Status: DC | PRN

## 2018-01-21 MED ORDER — MIDAZOLAM HCL 2 MG/2ML IJ SOLN
1.0000 mg | INTRAMUSCULAR | Status: DC | PRN
  Administered 2018-01-21: 2 mg via INTRAVENOUS

## 2018-01-21 MED ORDER — LIDOCAINE HCL (CARDIAC) PF 100 MG/5ML IV SOSY
PREFILLED_SYRINGE | INTRAVENOUS | Status: DC | PRN
  Administered 2018-01-21: 80 mg via INTRAVENOUS

## 2018-01-21 MED ORDER — SCOPOLAMINE 1 MG/3DAYS TD PT72: 1.0000 | MEDICATED_PATCH | Freq: Once | TRANSDERMAL | Status: DC | PRN

## 2018-01-21 MED ORDER — SODIUM BICARBONATE 4 % IV SOLN
INTRAVENOUS | Status: DC | PRN
  Administered 2018-01-21: 5 mL via INTRAVENOUS

## 2018-01-21 MED ORDER — CEFAZOLIN SODIUM-DEXTROSE 2-4 GM/100ML-% IV SOLN
2.0000 g | INTRAVENOUS | Status: AC
  Administered 2018-01-21: 2 g via INTRAVENOUS

## 2018-01-21 MED ORDER — MEPERIDINE HCL 25 MG/ML IJ SOLN: 6.2500 mg | INTRAMUSCULAR | Status: DC | PRN

## 2018-01-21 SURGICAL SUPPLY — 69 items
APL SKNCLS STERI-STRIP NONHPOA (GAUZE/BANDAGES/DRESSINGS) ×4
BAG DECANTER FOR FLEXI CONT (MISCELLANEOUS) ×1 IMPLANT
BENZOIN TINCTURE PRP APPL 2/3 (GAUZE/BANDAGES/DRESSINGS) ×8 IMPLANT
BINDER BREAST XLRG (GAUZE/BANDAGES/DRESSINGS) IMPLANT
BINDER BREAST XXLRG (GAUZE/BANDAGES/DRESSINGS) ×3 IMPLANT
BLADE KNIFE  20 PERSONNA (BLADE) ×4
BLADE KNIFE 20 PERSONNA (BLADE) ×4 IMPLANT
BLADE KNIFE PERSONA 10 (BLADE) IMPLANT
BLADE KNIFE PERSONA 15 (BLADE) ×4 IMPLANT
CANISTER SUCT 1200ML W/VALVE (MISCELLANEOUS) ×4 IMPLANT
COVER BACK TABLE 60X90IN (DRAPES) ×4 IMPLANT
COVER MAYO STAND STRL (DRAPES) ×4 IMPLANT
DECANTER SPIKE VIAL GLASS SM (MISCELLANEOUS) ×8 IMPLANT
DRAIN CHANNEL 10F 3/8 F FF (DRAIN) ×8 IMPLANT
DRAPE LAPAROSCOPIC ABDOMINAL (DRAPES) ×4 IMPLANT
DRSG PAD ABDOMINAL 8X10 ST (GAUZE/BANDAGES/DRESSINGS) ×16 IMPLANT
ELECT REM PT RETURN 9FT ADLT (ELECTROSURGICAL) ×4
ELECTRODE REM PT RTRN 9FT ADLT (ELECTROSURGICAL) ×2 IMPLANT
EVACUATOR SILICONE 100CC (DRAIN) ×8 IMPLANT
GAUZE SPONGE 4X4 12PLY STRL (GAUZE/BANDAGES/DRESSINGS) ×8 IMPLANT
GAUZE XEROFORM 5X9 LF (GAUZE/BANDAGES/DRESSINGS) ×8 IMPLANT
GLOVE BIO SURGEON STRL SZ 6.5 (GLOVE) ×3 IMPLANT
GLOVE BIO SURGEONS STRL SZ 6.5 (GLOVE) ×1
GLOVE BIOGEL M STRL SZ7.5 (GLOVE) ×4 IMPLANT
GLOVE BIOGEL PI IND STRL 8 (GLOVE) ×2 IMPLANT
GLOVE BIOGEL PI INDICATOR 8 (GLOVE) ×2
GLOVE ECLIPSE 7.0 STRL STRAW (GLOVE) ×4 IMPLANT
GOWN STRL REUS W/ TWL XL LVL3 (GOWN DISPOSABLE) ×4 IMPLANT
GOWN STRL REUS W/TWL XL LVL3 (GOWN DISPOSABLE) ×8
IV NS 500ML (IV SOLUTION) ×4
IV NS 500ML BAXH (IV SOLUTION) ×2 IMPLANT
MARKER SKIN DUAL TIP RULER LAB (MISCELLANEOUS) ×4 IMPLANT
NDL SPNL 18GX3.5 QUINCKE PK (NEEDLE) ×1 IMPLANT
NEEDLE SPNL 18GX3.5 QUINCKE PK (NEEDLE) ×4 IMPLANT
NS IRRIG 1000ML POUR BTL (IV SOLUTION) IMPLANT
PACK BASIN DAY SURGERY FS (CUSTOM PROCEDURE TRAY) ×4 IMPLANT
PAD ALCOHOL SWAB (MISCELLANEOUS) IMPLANT
PEN SKIN MARKING BROAD TIP (MISCELLANEOUS) ×4 IMPLANT
PILLOW FOAM RUBBER ADULT (PILLOWS) ×4 IMPLANT
PIN SAFETY STERILE (MISCELLANEOUS) ×4 IMPLANT
SHEETING SILICONE GEL EPI DERM (MISCELLANEOUS) IMPLANT
SLEEVE SCD COMPRESS KNEE MED (MISCELLANEOUS) ×4 IMPLANT
SPECIMEN JAR MEDIUM (MISCELLANEOUS) IMPLANT
SPECIMEN JAR X LARGE (MISCELLANEOUS) ×8 IMPLANT
SPONGE LAP 18X18 RF (DISPOSABLE) ×16 IMPLANT
STRIP SUTURE WOUND CLOSURE 1/2 (SUTURE) ×20 IMPLANT
SUT MNCRL AB 3-0 PS2 18 (SUTURE) ×24 IMPLANT
SUT MON AB 2-0 CT1 36 (SUTURE) IMPLANT
SUT MON AB 5-0 PS2 18 (SUTURE) ×8 IMPLANT
SUT PROLENE 3 0 PS 2 (SUTURE) ×24 IMPLANT
SUT VLOC 180 0 24IN GS25 (SUTURE) IMPLANT
SUT VLOC 90 P-14 23 (SUTURE) ×8 IMPLANT
SYR 50ML LL SCALE MARK (SYRINGE) ×8 IMPLANT
SYR CONTROL 10ML LL (SYRINGE) IMPLANT
SYR TB 1ML LL NO SAFETY (SYRINGE) IMPLANT
TAPE HYPAFIX 6 X30' (GAUZE/BANDAGES/DRESSINGS) ×1
TAPE HYPAFIX 6X30 (GAUZE/BANDAGES/DRESSINGS) ×3 IMPLANT
TAPE MEASURE 72IN RETRACT (INSTRUMENTS)
TAPE MEASURE LINEN 72IN RETRCT (INSTRUMENTS) IMPLANT
TAPE PAPER 1X10 WHT MICROPORE (GAUZE/BANDAGES/DRESSINGS) ×4 IMPLANT
TOWEL GREEN STERILE FF (TOWEL DISPOSABLE) ×8 IMPLANT
TOWEL OR NON WOVEN STRL DISP B (DISPOSABLE) ×4 IMPLANT
TUBE CONNECTING 20'X1/4 (TUBING) ×1
TUBE CONNECTING 20X1/4 (TUBING) ×3 IMPLANT
TUBING INFILTRATION IT-10001 (TUBING) IMPLANT
TUBING SET GRADUATE ASPIR 12FT (MISCELLANEOUS) IMPLANT
UNDERPAD 30X30 (UNDERPADS AND DIAPERS) ×12 IMPLANT
VAC PENCILS W/TUBING CLEAR (MISCELLANEOUS) ×4 IMPLANT
YANKAUER SUCT BULB TIP NO VENT (SUCTIONS) ×4 IMPLANT

## 2018-01-21 NOTE — Anesthesia Postprocedure Evaluation (Signed)
Anesthesia Post Note  Patient: Michelle Patton  Procedure(s) Performed: BREAST REDUCTION WITH LIPOSUCTION (Bilateral Breast)     Patient location during evaluation: PACU Anesthesia Type: General Level of consciousness: awake and alert and oriented Pain management: pain level controlled Vital Signs Assessment: post-procedure vital signs reviewed and stable Respiratory status: spontaneous breathing, nonlabored ventilation and respiratory function stable Cardiovascular status: blood pressure returned to baseline and stable Postop Assessment: no apparent nausea or vomiting Anesthetic complications: no    Last Vitals:  Vitals:   01/21/18 1430 01/21/18 1445  BP: 138/76 127/72  Pulse: 82 84  Resp: 15 12  Temp:    SpO2: 96% 95%    Last Pain:  Vitals:   01/21/18 1430  TempSrc:   PainSc: 7                  Krysti Hickling A.

## 2018-01-21 NOTE — H&P (Signed)
Michelle Patton is an 62 y.o. female.   Chief Complaint: Severe macromastia VPX:[id number] back and shoulder pain with intertrigo and rashes severe  Increased lateral axillary breast tissue  Past Medical History:  Diagnosis Date  . Anxiety   . Aortic regurgitation 07/20/2014  . Aortic stenosis, severe 07/20/2014  . Arthritis   . DVT (deep venous thrombosis) ([place])   . Family history of adverse reaction to anesthesia    Reports father deliurm in his 58's with CABG  . GERD (gastroesophageal reflux disease)   . History of pneumonia   . Hyperlipidemia   . Hypertension   . Insomnia, unspecified   . Muscle weakness (generalized)   . Peripheral artery disease ([place])   . 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 Sleep apnea    diagnosed multiple years ago at [facility]  . Supravalvular aortic stenosis, congenital - s/p repair during childhood     Past Surgical History:  Procedure Laterality Date  . ABDOMINAL AORTAGRAM  06/24/12  . ABDOMINAL AORTAGRAM N/A 06/24/2012   Procedure: ABDOMINAL Maxcine Ham;  Surgeon: Angelia Mould, MD;  Location: [facility] CATH LAB;  Service: Cardiovascular;  Laterality: N/A;  . AORTIC VALVE REPLACEMENT N/A 10/07/2014   Procedure: REDO AORTIC VALVE REPLACEMENT (AVR);  Surgeon: Rexene Alberts, MD;  Location: [place];  Service: Open Heart Surgery;  Laterality: N/A;  . ASCENDING AORTIC ROOT REPLACEMENT N/A 10/07/2014   Procedure: ASCENDING AORTIC ROOT REPLACEMENT;  Surgeon: Rexene Alberts, MD;  Location: [place];  Service: Open Heart Surgery;  Laterality: N/A;  . BIOPSY  09/27/2016   Procedure: BIOPSY;  Surgeon: Daneil Dolin, MD;  [facility];  Service: Endoscopy;;  colon  . [place]  . CERVICAL FUSION    . CHOLECYSTECTOMY    . COLONOSCOPY WITH PROPOFOL N/A 09/27/2016   Procedure: COLONOSCOPY WITH PROPOFOL;  Surgeon: Daneil Dolin, MD;  [facility];  Service: Endoscopy;  Laterality: N/A;  1:00pm  . ESOPHAGOGASTRODUODENOSCOPY (EGD) WITH PROPOFOL N/A 09/27/2016   Procedure: ESOPHAGOGASTRODUODENOSCOPY (EGD) WITH PROPOFOL;  Surgeon: Daneil Dolin, MD;  [facility];  Service: Endoscopy;  Laterality: N/A;  . ILIAC ARTERY STENT Left 12/2007  . KNEE ARTHROSCOPY WITH MEDIAL MENISECTOMY Right 01/10/2018   Procedure: RIGHT KNEE ARTHROSCOPY WITH PARTIAL MEDIAL MENISECTOMY;  Surgeon: Carole Civil, MD;  Location[facility];  Service: Orthopedics;  Laterality: Right;  . LEFT AND RIGHT HEART CATHETERIZATION WITH CORONARY ANGIOGRAM N/A 07/31/2014   Procedure: LEFT AND RIGHT HEART CATHETERIZATION WITH CORONARY ANGIOGRAM;  Surgeon: Burnell Blanks, MD;  Location: [facility] C[facility] LAB;  Service: Cardiovascular;  Laterality: N/A;  . Venia Minks DILATION N/A 09/27/2016   Procedure: Venia Minks DILATION;  Surgeon: Daneil Dolin, MD;  [facility];  Service: Endoscopy;  Laterality: N/A;  . POLYPECTOMY  09/27/2016   Procedure: POLYPECTOMY;  Surgeon: Daneil Dolin, MD;  [facility];  Service: Endoscopy;;  colon  . ROTATOR CUFF REPAIR Bilateral   . TEE WITHOUT CARDIOVERSION N/A 07/07/2014   Procedure: TRANSESOPHAGEAL ECHOCARDIOGRAM (TEE);  Surgeon: Arnoldo Lenis, MD;  [facility];  Service: Cardiology;  Laterality: N/A;  . TEE WITHOUT CARDIOVERSION N/A 07/20/2014   Procedure: TRANSESOPHAGEAL ECHOCARDIOGRAM (TEE) WITH PROPOFOL;  Surgeon: Arnoldo Lenis, MD;  [facility];  Service:  Endoscopy;  Laterality: N/A;  . TEE WITHOUT CARDIOVERSION N/A 10/07/2014   Procedure: TRANSESOPHAGEAL ECHOCARDIOGRAM (TEE);  Surgeon: Rexene Alberts, MD;  Location: [place];  Service: Open Heart Surgery;  Laterality: N/A;    Family History  Problem Relation Age of Onset  . Hypertension Mother   . Hypertension Father   . Heart disease Father         before age 72  . Other Father        varicose veins  . COPD Father   . Colon cancer Neg Hx   . Celiac disease Neg Hx   . Inflammatory bowel disease Neg Hx    Social History:  reports that she has been smoking cigarettes. She has a 20.00 pack-year smoking history. She has never used smokeless tobacco. She reports that she drinks about 4.0 standard drinks of alcohol per week. She reports that she does not use drugs.  Allergies:  Allergies  Allergen Reactions  . Penicillins Hives    Has patient had a PCN reaction causing immediate rash, facial/tongue/throat swelling, SOB or lightheadedness with hypotension: Yes Has patient had a PCN reaction causing severe rash involving mucus membranes or skin necrosis: No Has patient had a PCN reaction that required hospitalization No Has patient had a PCN reaction occurring within the last 10 years: No If all of the above answers are "NO", then may proceed with Cephalosporin use.  Have taken Keflex before without any adverse reaction.   . Sulfa Antibiotics Nausea And Vomiting    Medications Prior to Admission  Medication Sig Dispense Refill  . aspirin 81 MG tablet Take 81 mg by mouth daily.    . Cholecalciferol (VITAMIN D3) 5000 UNITS TABS Take 5,000 Units by mouth every morning.     . clonazePAM (KLONOPIN) 1 MG tablet Take 1 mg by mouth at bedtime.     . Cranberry-Vitamin C-Vitamin E (CRANBERRY PLUS VITAMIN C) 4200-20-3 MG-MG-UNIT CAPS Take 1 capsule by mouth every morning.     . diclofenac (VOLTAREN) 75 MG EC tablet Take 1 tablet (75 mg total) by mouth 2 (two) times daily. 180 tablet 1  . estradiol (ESTRACE) 1 MG tablet Take 1 mg by mouth every morning.     . Flaxseed, Linseed, (FLAXSEED OIL) 1000 MG CAPS Take 1 capsule by mouth daily.    . furosemide (LASIX) 40 MG tablet Take 1 tablet (40 mg total) by mouth daily as needed for edema. (Patient taking differently: Take 40 mg by mouth daily. ) 90 tablet 3  . HYDROcodone-acetaminophen (NORCO/VICODIN)  5-325 MG tablet Take 1-2 tablets by mouth every 4 (four) hours as needed for moderate pain. 40 tablet 0  . medroxyPROGESTERone (PROVERA) 2.5 MG tablet Take 2.5 mg by mouth every morning.     . metoprolol tartrate (LOPRESSOR) 25 MG tablet TAKE 1 TABLET TWICE A DAY 180 tablet 4  . Multiple Vitamins-Minerals (MULTIVITAMIN WITH MINERALS) tablet Take 1 tablet by mouth every morning.     [name] OVER THE COUNTER MEDICATION Apply 1 application topically as needed (pain). CBD CREAM    . RABEprazole (ACIPHEX) 20 MG tablet Take 20 mg by mouth 2 (two) times daily.     . rosuvastatin (CRESTOR) 10 MG tablet Take 10 mg by mouth daily.     . vitamin E 400 UNIT capsule Take 400 Units by mouth daily.    [name] zolpidem (AMBIEN) 10 MG tablet Take 10 mg by mouth at bedtime as needed for sleep.  No results found for this or any previous visit (from the past 48 hour(s)). No results found.  Review of Systems  Constitutional: Negative.   HENT: Negative.   Eyes: Negative.   Respiratory: Negative.   Cardiovascular: Negative.   Genitourinary: Negative.   Musculoskeletal: Positive for back pain and neck pain.  Skin: Positive for rash.  Neurological: Negative.   Endo/Heme/Allergies: Negative.     Blood pressure 131/64, pulse 61, temperature 98 F (36.7 C), temperature source Oral, resp. rate 18, height 5\' 6"  (1.676 m), weight 101 kg, SpO2 100 %. Physical Exam  Constitutional: She appears well-developed and well-nourished.  HENT:  Head: Normocephalic and atraumatic.  Eyes: Pupils are equal, round, and reactive to light. EOM are normal.  Neck: Normal range of motion. Neck supple. No tracheal deviation present. No thyromegaly present.  Cardiovascular: Normal rate and regular rhythm.  Respiratory: Effort normal and breath sounds normal.  GI: Soft. Bowel sounds are normal.  Musculoskeletal: Normal range of motion. She exhibits no edema.  Skin: Skin is warm.  Psychiatric: She has a normal mood and affect. Her  behavior is normal.  severe macromastia with accessory breast tissue   Assessment/Plansevere macromastia  For bhilateral breast reductions and excision of accessory breast tissue*  Teller Wakefield L, MD 01/21/2018, 10:43 AM

## 2018-01-21 NOTE — Op Note (Signed)
NAME: Michelle Patton, Michelle Patton MEDICAL RECORD HU:[id number] ACCOUNT [id number] DATE OF BIRTH:January 04, 1956 FACILITY: [facility] LOCATION: [facility] PHYSICIAN:Ritisha Deitrick Octavia Heir, MD  OPERATIVE REPORT  DATE OF PROCEDURE:  01/21/2018  SURGEON:  Cristine Polio, MD  The patient with severe, severe macromastia, back and shoulder pain secondary to large pendulous breasts, increased excessive breast tissue as well, the right side greater than left.  ANESTHESIA:  General.  PROCEDURE:  All procedures in detail were explained to the patient.  Preoperative drawings were made, remarked and nipple areolar complexes from over 38 cm from the suprasternal notch down to 24.  After undergoing general anesthesia, intubated orally,  prep was done to the chest, breast areas in routine fashion using Hibiclens soap and solution walled off with sterile towels and drapes so as to make a sterile field.  Tumescent solution was injected in both breast 500 cc per side.  This was allowed to  sit up and then the wounds were scored with a #15 blade.  Skin of the inferior pedicle was deepithelialized #20 blade.  Medial fatty dermal pedicles were excised down to underlying pectoralis major muscle and a new keyhole area above was also taken out.   Again, hemostasis was maintained with the Bovie unit and coagulation.  Next, liposuction was fashioned in the right and left axillary region, removing accessory breast tissue from the areas, right side greater than left.  Flaps were transposed and  stayed with 3-0 Prolene sutures.  Subcutaneous closure was done with 3-0 Monocryl x2 layers and then running subcuticular stitch of 3-0 Monocryl and 5-0 Monocryl throughout the inverted T.  The wounds were drained with a #10 fully fluted Blake drains  which were placed in the depths of the wound and brought out through the lateral most portion of the incision and secured with 3-0 Prolene.  The wounds were cleansed.  Steri-Strips, soft dressing applied to  all the areas.  Hypafix tape and garment.  She  withstood the procedure very well, was taken to recovery in excellent condition.  TN/NUANCE  D:01/21/2018 T:01/21/2018 JOB:[id number]

## 2018-01-21 NOTE — Discharge Instructions (Signed)
Breast Reduction Care After Refer to this sheet in the next few weeks. These instructions provide you with information on caring for yourself after your procedure. Your caregiver may also give you more specific instructions. Your treatment has been planned according to current medical practices, but problems sometimes occur. Call your caregiver if you have any problems or questions after your procedure. HOME CARE INSTRUCTIONS  Do not lift more than 5 pounds with one arm, or 10 pounds with both arms, for 1 month.   Do not sleep on your stomach for 4 to 6 weeks.   Do not do vigorous exercise such as bouncing, aerobics, or jumping for 6 weeks. Walking is not restricted.   Do not drive while you are taking prescription pain medicine.   Avoid prolonged sun exposure.   Keep dressings dry and clean  Measure jp drainage every 12 hrs and measure   You may slowly go back to your normal diet. Start with a light meal and increase as comfortable.   You may shower 24 hours after your drains are removed unless instructed differently by your caregiver.   Take your pain medicine as prescribed. Discomfort is normal after breast reduction surgery.   Keep the head of your bed elevated 40 degrees   : Call the office if you notice:  You have a fever.   You notice drainage from the incision that smells bad.   You have persistent pain.   You have persistent bleeding from the incision or nipple discharge.   You develop increased swelling or swelling that is greater in one breast than in the other.  MAKE SURE YOU:   Understand these instructions.   Will watch your condition.   Will get help right away if you are not doing well or get worse.  Document Released: 11/23/2003 Document Revised: 12/21/2010 Document Reviewed: 07/04/2007 [facility] Patient Information 2012 [place].    Post Anesthesia Home Care Instructions  Activity: Get plenty of rest for the remainder of the day. A  responsible individual must stay with you for 24 hours following the procedure.  For the next 24 hours, DO NOT: -Drive a car -Paediatric nurse -Drink alcoholic beverages -Take any medication unless instructed by your physician -Make any legal decisions or sign important papers.  Meals: Start with liquid foods such as gelatin or soup. Progress to regular foods as tolerated. Avoid greasy, spicy, heavy foods. If nausea and/or vomiting occur, drink only clear liquids until the nausea and/or vomiting subsides. Call your physician if vomiting continues.  Special Instructions/Symptoms: Your throat may feel dry or sore from the anesthesia or the breathing tube placed in your throat during surgery. If this causes discomfort, gargle with warm salt water. The discomfort should disappear within 24 hours.  If you had a scopolamine patch placed behind your ear for the management of post- operative nausea and/or vomiting:  1. The medication in the patch is effective for 72 hours, after which it should be removed.  Wrap patch in a tissue and discard in the trash. Wash hands thoroughly with soap and water. 2. You may remove the patch earlier than 72 hours if you experience unpleasant side effects which may include dry mouth, dizziness or visual disturbances. 3. Avoid touching the patch. Wash your hands with soap and water after contact with the patch.

## 2018-01-21 NOTE — Transfer of Care (Addendum)
Immediate Anesthesia Transfer of Care Note  Patient: Michelle Patton  Procedure(s) Performed: BREAST REDUCTION WITH LIPOSUCTION (Bilateral Breast)  Patient Location: PACU  Anesthesia Type:General  Level of Consciousness: awake, sedated and patient cooperative  Airway & Oxygen Therapy: Patient Spontanous Breathing and Patient connected to face mask oxygen  Post-op Assessment: Report given to RN and Post -op Vital signs reviewed and stable  Post vital signs: Reviewed and stable  Last Vitals:  Vitals Value Taken Time  BP    Temp    Pulse 90 01/21/2018  1:55 PM  Resp 9 01/21/2018  1:55 PM  SpO2 97 % 01/21/2018  1:55 PM  Vitals shown include unvalidated device data.  Last Pain:  Vitals:   01/21/18 0810  TempSrc: Oral         Complications: No apparent anesthesia complications

## 2018-01-21 NOTE — Anesthesia Preprocedure Evaluation (Signed)
Anesthesia Evaluation  Patient identified by MRN, date of birth, ID band Patient awake    Reviewed: Allergy & Precautions, NPO status , Patient's Chart, lab work & pertinent test results, reviewed documented beta blocker date and time   History of Anesthesia Complications (+) Family history of anesthesia reaction  Airway Mallampati: II  TM Distance: >3 FB Neck ROM: Full    Dental   Pulmonary sleep apnea and Continuous Positive Airway Pressure Ventilation , Current Smoker,    Pulmonary exam normal breath sounds clear to auscultation       Cardiovascular hypertension, Pt. on medications and Pt. on home beta blockers + Peripheral Vascular Disease  Normal cardiovascular exam+ Valvular Problems/Murmurs AI and AS  Rhythm:Regular Rate:Normal  Hx/o congenital supravalvular aortic stenosis S/P AVR with conduit 09/2014  Echo 10/10/2017-Left ventricle: The cavity size was normal. Wall thickness was normal. Systolic function was vigorous. The estimated ejection fraction was in the range of 65% to 70%. Wall motion was normal; there were no regional wall motion abnormalities. Doppler parameters are consistent with abnormal left ventricular relaxation (grade 1 diastolic dysfunction). Doppler parameters  are consistent with indeterminate ventricular filling pressure. - Aortic valve: 21 mm Medtronic Freestyle stentless porcine valve/aortic root graft in place. There was no significant regurgitation. There was no stenosis. Peak gradient (S): 11 mm Hg. - Mitral valve: Mildly calcified annulus. Normal thickness leaflets . - Tricuspid valve: There was mild regurgitation. - Pulmonary arteries: PA peak pressure: 35 mm Hg (S).   Neuro/Psych Anxiety    GI/Hepatic Neg liver ROS, GERD  Medicated and Controlled,  Endo/Other  Hyperlipidemia Obesity  Renal/GU negative Renal ROS  negative genitourinary   Musculoskeletal  (+) Arthritis , Osteoarthritis,    Abdominal (+) + obese,   Peds  Hematology negative hematology ROS (+)   Anesthesia Other Findings   Reproductive/Obstetrics                             Anesthesia Physical Anesthesia Plan  ASA: III  Anesthesia Plan: General   Post-op Pain Management:    Induction: Intravenous  PONV Risk Score and Plan:   Airway Management Planned:   Additional Equipment:   Intra-op Plan:   Post-operative Plan:   Informed Consent:   Plan Discussed with:   Anesthesia Plan Comments:         Anesthesia Quick Evaluation

## 2018-01-21 NOTE — Anesthesia Procedure Notes (Signed)
Procedure Name: Intubation Date/Time: 01/21/2018 11:25 AM Performed by: Lyndee Leo, CRNA Pre-anesthesia Checklist: Patient identified, Emergency Drugs available, Suction available and Patient being monitored Patient Re-evaluated:Patient Re-evaluated prior to induction Oxygen Delivery Method: Circle system utilized Preoxygenation: Pre-oxygenation with 100% oxygen Induction Type: IV induction Ventilation: Mask ventilation without difficulty Laryngoscope Size: Mac and 3 Grade View: Grade I Tube type: Oral Tube size: 7.0 mm Number of attempts: 1 Airway Equipment and Method: Stylet and Oral airway Placement Confirmation: ETT inserted through vocal cords under direct vision,  positive ETCO2 and breath sounds checked- equal and bilateral Secured at: 21 cm Tube secured with: Tape Dental Injury: Teeth and Oropharynx as per pre-operative assessment

## 2018-01-22 ENCOUNTER — Encounter (HOSPITAL_BASED_OUTPATIENT_CLINIC_OR_DEPARTMENT_OTHER): Payer: Self-pay | Admitting: Specialist

## 2018-01-29 ENCOUNTER — Encounter: Payer: Self-pay | Admitting: Cardiology

## 2018-01-29 ENCOUNTER — Ambulatory Visit: Payer: BLUE CROSS/BLUE SHIELD | Admitting: Cardiology

## 2018-01-29 VITALS — BP 122/64 | HR 67 | Ht 66.0 in | Wt 219.0 lb

## 2018-01-29 DIAGNOSIS — Z952 Presence of prosthetic heart valve: Secondary | ICD-10-CM | POA: Diagnosis not present

## 2018-01-29 DIAGNOSIS — R0602 Shortness of breath: Secondary | ICD-10-CM

## 2018-01-29 NOTE — Progress Notes (Signed)
Clinical Summary Michelle Patton is a 62 y.o.female seen today for follow up of the following medical problems. This is a focused visit on recent symptoms of leg edema and SOB.   1. Aortic stenosis - hx in 1968 at age of 62 of supravavlular aortic stenosis, corrective surgery with patching at that time.  - recently found to have developed severe aortic valvular stenosis with possible recurrence of supravalvular stenosis as well based on imaging. 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  - Medtronic Freestyle stentless porcine valve/aortic root graft (size 21 mm, model # 995, serial # [id number])    09/2017 echo LVEF 65-70%, no WMAS, grade I diastolic dysfunction, normal AVR. - takes lasix 71m prn, has not taken in awhile.  - since last visit SOB and LE edema has resolved.         SH: Her husband Raschie LRedlichis also a patient of mine.  [place].  Enjoys riding motorcycles   Past Medical History:  Diagnosis Date  . Anxiety   . Aortic regurgitation 07/20/2014  . Aortic stenosis, severe 07/20/2014  . Arthritis   . DVT (deep venous thrombosis) ([place]   . Family history of adverse reaction to anesthesia    Reports father deliurm in his 717'swith CABG  . GERD (gastroesophageal reflux disease)   . History of pneumonia   . Hyperlipidemia   . Hypertension   . Insomnia, unspecified   . Muscle weakness (generalized)   . Peripheral artery disease ([place]   . 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 Sleep apnea    [facility] . Supravalvular aortic stenosis,  congenital - s/p repair during childhood      Allergies  Allergen Reactions  . Penicillins Hives    Has patient had a PCN reaction causing immediate rash, facial/tongue/throat swelling, SOB or lightheadedness with hypotension: Yes Has patient had a PCN reaction causing severe rash involving mucus membranes or skin necrosis: No Has patient had a PCN reaction that required hospitalization No Has patient had a PCN reaction occurring within the last 10 years: No If all of the above answers are "NO", then may proceed with Cephalosporin use.  Have taken Keflex before without any adverse reaction.   . Sulfa Antibiotics Nausea And Vomiting     Current Outpatient Medications  Medication Sig Dispense Refill  . Cholecalciferol (VITAMIN D3) 5000 UNITS TABS Take 5,000 Units by mouth every morning.     . clonazePAM (KLONOPIN) 1 MG tablet Take 1 mg by mouth at bedtime.     . Cranberry-Vitamin C-Vitamin E (CRANBERRY PLUS VITAMIN C) 4200-20-3 MG-MG-UNIT CAPS Take 1 capsule by mouth every morning.     . diclofenac (VOLTAREN) 75 MG EC tablet Take 1 tablet (75 mg total) by mouth 2 (two) times daily. 180 tablet 1  . estradiol (ESTRACE) 1 MG tablet Take 1 mg by mouth every morning.     . Flaxseed, Linseed, (FLAXSEED OIL) 1000 MG CAPS Take 1 capsule by mouth daily.    . furosemide (LASIX) 40 MG tablet Take 1 tablet (40 mg total) by mouth daily as  needed for edema. (Patient taking differently: Take 40 mg by mouth daily. ) 90 tablet 3  . HYDROcodone-acetaminophen (NORCO/VICODIN) 5-325 MG tablet Take 1-2 tablets by mouth every 4 (four) hours as needed for moderate pain. 40 tablet 0  . medroxyPROGESTERone (PROVERA) 2.5 MG tablet Take 2.5 mg by mouth every morning.     . metoprolol tartrate (LOPRESSOR) 25 MG tablet TAKE 1 TABLET TWICE A DAY 180 tablet 4  . OVER THE COUNTER MEDICATION Apply 1 application topically as needed (pain). CBD CREAM    . RABEprazole (ACIPHEX) 20 MG tablet Take 20 mg by mouth 2 (two) times  daily.     . rosuvastatin (CRESTOR) 10 MG tablet Take 10 mg by mouth daily.     . vitamin E 400 UNIT capsule Take 400 Units by mouth daily.    [name] zolpidem (AMBIEN) 10 MG tablet Take 10 mg by mouth at bedtime as needed for sleep.      No current facility-administered medications for this visit.      Past Surgical History:  Procedure Laterality Date  . ABDOMINAL AORTAGRAM  06/24/12  . ABDOMINAL AORTAGRAM N/A 06/24/2012   Procedure: ABDOMINAL Maxcine Ham;  Surgeon: Angelia Mould, MD;  Location: [facility] CATH LAB;  Service: Cardiovascular;  Laterality: N/A;  . AORTIC VALVE REPLACEMENT N/A 10/07/2014   Procedure: REDO AORTIC VALVE REPLACEMENT (AVR);  Surgeon: Rexene Alberts, MD;  Location: [place];  Service: Open Heart Surgery;  Laterality: N/A;  . ASCENDING AORTIC ROOT REPLACEMENT N/A 10/07/2014   Procedure: ASCENDING AORTIC ROOT REPLACEMENT;  Surgeon: Rexene Alberts, MD;  Location: [place];  Service: Open Heart Surgery;  Laterality: N/A;  . BIOPSY  09/27/2016   Procedure: BIOPSY;  Surgeon: Daneil Dolin, MD;  [facility];  Service: Endoscopy;;  colon  . BREAST REDUCTION SURGERY Bilateral 01/21/2018   Procedure: BREAST REDUCTION WITH LIPOSUCTION;  Surgeon: Cristine Polio, MD;  Location: [place];  Service: Plastics;  Laterality: Bilateral;  . [place]  . CERVICAL FUSION    . CHOLECYSTECTOMY    . COLONOSCOPY WITH PROPOFOL N/A 09/27/2016   Procedure: COLONOSCOPY WITH PROPOFOL;  Surgeon: Daneil Dolin, MD;  [facility];  Service: Endoscopy;  Laterality: N/A;  1:00pm  . ESOPHAGOGASTRODUODENOSCOPY (EGD) WITH PROPOFOL N/A 09/27/2016   Procedure: ESOPHAGOGASTRODUODENOSCOPY (EGD) WITH PROPOFOL;  Surgeon: Daneil Dolin, MD;  [facility];  Service: Endoscopy;  Laterality: N/A;  . ILIAC ARTERY STENT Left 12/2007  . KNEE ARTHROSCOPY WITH MEDIAL MENISECTOMY Right 01/10/2018   Procedure: RIGHT KNEE ARTHROSCOPY WITH PARTIAL MEDIAL  MENISECTOMY;  Surgeon: Carole Civil, MD;  [facility];  Service: Orthopedics;  Laterality: Right;  . LEFT AND RIGHT HEART CATHETERIZATION WITH CORONARY ANGIOGRAM N/A 07/31/2014   Procedure: LEFT AND RIGHT HEART CATHETERIZATION WITH CORONARY ANGIOGRAM;  Surgeon: Burnell Blanks, MD;  Location: [facility] CATH LAB;  Service: Cardiovascular;  Laterality: N/A;  . Venia Minks DILATION N/A 09/27/2016   Procedure: Venia Minks DILATION;  Surgeon: Daneil Dolin, MD;  [facility];  Service: Endoscopy;  Laterality: N/A;  . POLYPECTOMY  09/27/2016   Procedure: POLYPECTOMY;  Surgeon: Daneil Dolin, MD;  [facility];  Service: Endoscopy;;  colon  . ROTATOR CUFF REPAIR Bilateral   . TEE WITHOUT CARDIOVERSION N/A 07/07/2014   Procedure: TRANSESOPHAGEAL ECHOCARDIOGRAM (TEE);  Surgeon: Arnoldo Lenis, MD;  [facility];  Service: Cardiology;  Laterality: N/A;  . TEE WITHOUT CARDIOVERSION  N/A 07/20/2014   Procedure: TRANSESOPHAGEAL ECHOCARDIOGRAM (TEE) WITH PROPOFOL;  Surgeon: Arnoldo Lenis, MD;  [facility];  Service: Endoscopy;  Laterality: N/A;  . TEE WITHOUT CARDIOVERSION N/A 10/07/2014   Procedure: TRANSESOPHAGEAL ECHOCARDIOGRAM (TEE);  Surgeon: Rexene Alberts, MD;  Location: [place];  Service: Open Heart Surgery;  Laterality: N/A;     Allergies  Allergen Reactions  . Penicillins Hives    Has patient had a PCN reaction causing immediate rash, facial/tongue/throat swelling, SOB or lightheadedness with hypotension: Yes Has patient had a PCN reaction causing severe rash involving mucus membranes or skin necrosis: No Has patient had a PCN reaction that required hospitalization No Has patient had a PCN reaction occurring within the last 10 years: No If all of the above answers are "NO", then may proceed with Cephalosporin use.  Have taken Keflex before without any adverse reaction.   . Sulfa Antibiotics Nausea And Vomiting      Family History  Problem  Relation Age of Onset  . Hypertension Mother   . Hypertension Father   . Heart disease Father        before age 45  . Other Father        varicose veins  . COPD Father   . Colon cancer Neg Hx   . Celiac disease Neg Hx   . Inflammatory bowel disease Neg Hx      Social History Ms. Decamp reports that she has been smoking cigarettes. She has a 20.00 pack-year smoking history. She has never used smokeless tobacco. Ms. Oconnor reports that she drinks about 4.0 standard drinks of alcohol per week.   Review of Systems CONSTITUTIONAL: No weight loss, fever, chills, weakness or fatigue.  HEENT: Eyes: No visual loss, blurred vision, double vision or yellow sclerae.No hearing loss, sneezing, congestion, runny nose or sore throat.  SKIN: No rash or itching.  CARDIOVASCULAR: per hpi RESPIRATORY: No shortness of breath, cough or sputum.  GASTROINTESTINAL: No anorexia, nausea, vomiting or diarrhea. No abdominal pain or blood.  GENITOURINARY: No burning on urination, no polyuria NEUROLOGICAL: No headache, dizziness, syncope, paralysis, ataxia, numbness or tingling in the extremities. No change in bowel or bladder control.  MUSCULOSKELETAL: No muscle, back pain, joint pain or stiffness.  LYMPHATICS: No enlarged nodes. No history of splenectomy.  PSYCHIATRIC: No history of depression or anxiety.  ENDOCRINOLOGIC: No reports of sweating, cold or heat intolerance. No polyuria or polydipsia.  [name]   Physical Examination Vitals:   01/29/18 1606  BP: 122/64  Pulse: 67  SpO2: 97%   Vitals:   01/29/18 1606  Weight: 219 lb (99.3 kg)  Height: _0  (1.676 m)    Gen: resting comfortably, no acute distress HEENT: no scleral icterus, pupils equal round and reactive, no palptable cervical adenopathy,  CV: RRR, 2/6 systolic murmur rusb, no jvd Resp: Clear to auscultation bilaterally GI: abdomen is soft, non-tender, non-distended, normal bowel sounds, no hepatosplenomegaly MSK: extremities are warm, no  edema.  Skin: warm, no rash Neuro:  no focal deficits Psych: appropriate affect   Diagnostic Studies 11/2015 echo Study Conclusions  - Left ventricle: The cavity size was normal. Wall thickness was normal. Systolic function was vigorous. The estimated ejection fraction was in the range of 65% to 70%. Wall motion was normal; there were no regional wall motion abnormalities. Doppler parameters are consistent with abnormal left ventricular relaxation (grade 1 diastolic dysfunction). - Aortic valve: 21 mm Medtronic Freestyle stentless porcine valve/aortic root graft in place. There was no  significant regurgitation. Peak gradient (S): 19 mm Hg. Valve area (Vmax): 1.83 cm^2. - Mitral valve: Calcified annulus. There was trivial regurgitation. - Right ventricle: The cavity size was mildly dilated. - Right atrium: Central venous pressure (est): 3 mm Hg. - Atrial septum: The septum bowed from right to left, consistent with increased right atrial pressure. - Tricuspid valve: There was mild regurgitation. - Pulmonic valve: Peak gradient (S): 19 mm Hg. - Pulmonary arteries: PA peak pressure: 21 mm Hg (S). - Pericardium, extracardiac: There was no pericardial effusion.  Impressions:  - Normal LV wall thickness with LVEF 65-70%. Grade 1 diastolic dysfunction. MAC with trivial mitral regurgitation. Bioprosthesis in aortic position as described above with no significant perivalvular regurgitation and peak gradient 19 mmHg. Mild RV enlargement. Mild tricuspid regurgitation with PASP estimated 21 mmHg.  07/2014 cath Hemodynamic Findings: Ao: 107/55  LV: 190/12/15 RA: 1 RV: 41/2/6 PA: 26/6 (mean 15)  PCWP: 7 Fick Cardiac Output: 4.76 L/min Fick Cardiac Index: 2.38 L/min/m2 Central Aortic Saturation: 96% Pulmonary Artery Saturation: 66%  Aortic valve data: Peak to peak gradient 83 mm Hg Mean gradient 56 mmHg AVA 0.52  cm2  Angiographic Findings:  Left main: Normal caliber vessel with no obstructive disease.   Left Anterior Descending Artery: Large caliber vessel that courses to the apex. Moderate caliber diagonal Rumaisa Schnetzer. No obstructive disease.   Circumflex Artery: Large caliber vessel with moderate caliber obtuse marginal Zakhari Fogel. No obstructive disease.   Right Coronary Artery: Large dominant vessel with no obstructive disease.   Left Ventricular Angiogram: LVEF=60%  Aortic root angiogram:The aortic root is not enlarged. There is supravalvular density.   Aortic valve calcification noted on plain fluoroscopy.   Impression: 1. No angiographic evidence of CAD 2. Normal LV systolic function 3. Severe gradient across the aortic valve into the aorta with density noted in the aortic root. Cannot exclude recurrent supravalvular stenosis along with aortic valve stenosis. 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  4. Normal filling pressures   09/2017 echo Study Conclusions  - Left ventricle: The cavity size was normal. Wall thickness was   normal. Systolic function was vigorous. The estimated ejection   fraction was in the range of 65% to 70%. Wall motion was normal;   there were no regional wall motion abnormalities. Doppler   parameters are consistent with abnormal left ventricular   relaxation (grade 1 diastolic dysfunction). Doppler parameters   are consistent with indeterminate ventricular filling pressure. - Aortic valve: 21 mm Medtronic Freestyle stentless porcine   valve/aortic root graft in place. There was no significant   regurgitation. There was no stenosis. Peak gradient (S): 11 mm   Hg. - Mitral valve: Mildly calcified annulus. Normal thickness leaflets   . - Tricuspid valve: There was mild regurgitation. - Pulmonary arteries: PA peak pressure: 35 mm Hg (S).  Assessment and Plan  1. Aortic stenosis - s/p tissue AVR and aortic  root graft placement - recent echo shows normal LVEF, normal AVR, mild diastolic dysfunction. Her prior SOB and LE edema have resolved, likely related to her diastolic dysfunction. Continue prn lasix.    F/u 6 months   Arnoldo Lenis, M.D.

## 2018-01-29 NOTE — Patient Instructions (Signed)

## 2018-02-01 ENCOUNTER — Ambulatory Visit: Payer: BLUE CROSS/BLUE SHIELD | Admitting: Cardiology

## 2018-02-08 ENCOUNTER — Ambulatory Visit (INDEPENDENT_AMBULATORY_CARE_PROVIDER_SITE_OTHER): Payer: BLUE CROSS/BLUE SHIELD | Admitting: Orthopedic Surgery

## 2018-02-08 VITALS — BP 129/83 | HR 68 | Ht 66.0 in | Wt 218.0 lb

## 2018-02-08 DIAGNOSIS — Z9889 Other specified postprocedural states: Secondary | ICD-10-CM

## 2018-02-08 DIAGNOSIS — Z4889 Encounter for other specified surgical aftercare: Secondary | ICD-10-CM

## 2018-02-08 NOTE — Progress Notes (Signed)
Chief Complaint  Patient presents with  . Follow-up    Recheck on right knee, DOS 01-10-18.   Doing well   Regained full range of motion no swelling she does complain of a click overall wants to return to work on the 21st  Return in 6 months for x-rays  01/10/2018   9:46 AM   PATIENT:  Michelle Patton  62 y.o. 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

## 2018-02-08 NOTE — Patient Instructions (Signed)
Return to work October 21

## 2018-02-12 ENCOUNTER — Ambulatory Visit: Payer: BLUE CROSS/BLUE SHIELD | Admitting: Cardiology

## 2018-02-15 ENCOUNTER — Ambulatory Visit: Payer: BLUE CROSS/BLUE SHIELD | Admitting: Cardiology

## 2018-04-26 ENCOUNTER — Other Ambulatory Visit: Payer: Self-pay | Admitting: Orthopedic Surgery

## 2018-04-26 DIAGNOSIS — M1712 Unilateral primary osteoarthritis, left knee: Secondary | ICD-10-CM

## 2018-06-10 ENCOUNTER — Other Ambulatory Visit: Payer: Self-pay | Admitting: Orthopedic Surgery

## 2018-06-10 DIAGNOSIS — M24132 Other articular cartilage disorders, left wrist: Secondary | ICD-10-CM

## 2018-06-24 ENCOUNTER — Other Ambulatory Visit: Payer: BLUE CROSS/BLUE SHIELD

## 2018-06-27 ENCOUNTER — Ambulatory Visit
Admission: RE | Admit: 2018-06-27 | Discharge: 2018-06-27 | Disposition: A | Discharge status: Home / Self Care | Payer: BLUE CROSS/BLUE SHIELD | Source: Ambulatory Visit | Attending: Orthopedic Surgery | Admitting: Orthopedic Surgery

## 2018-06-27 DIAGNOSIS — M24132 Other articular cartilage disorders, left wrist: Secondary | ICD-10-CM

## 2018-06-27 MED ORDER — IOPAMIDOL (ISOVUE-M 200) INJECTION 41%
2.0000 mL | Freq: Once | INTRAMUSCULAR | Status: AC
  Administered 2018-06-27: 2 mL via INTRA_ARTICULAR

## 2018-07-31 ENCOUNTER — Ambulatory Visit: Payer: BLUE CROSS/BLUE SHIELD | Admitting: Cardiology

## 2018-08-12 ENCOUNTER — Ambulatory Visit: Payer: BLUE CROSS/BLUE SHIELD | Admitting: Orthopedic Surgery

## 2018-08-14 ENCOUNTER — Ambulatory Visit: Payer: BLUE CROSS/BLUE SHIELD | Admitting: Orthopedic Surgery

## 2018-10-23 DIAGNOSIS — M24132 Other articular cartilage disorders, left wrist: Secondary | ICD-10-CM | POA: Insufficient documentation

## 2018-10-26 ENCOUNTER — Telehealth: Payer: Self-pay | Admitting: Cardiology

## 2018-10-26 ENCOUNTER — Emergency Department (HOSPITAL_COMMUNITY)
Admission: EM | Admit: 2018-10-26 | Discharge: 2018-10-26 | Disposition: A | Discharge status: Home / Self Care | Payer: BC Managed Care – PPO | Attending: Emergency Medicine | Admitting: Emergency Medicine

## 2018-10-26 ENCOUNTER — Other Ambulatory Visit: Payer: Self-pay

## 2018-10-26 ENCOUNTER — Encounter (HOSPITAL_COMMUNITY): Payer: Self-pay | Admitting: Emergency Medicine

## 2018-10-26 ENCOUNTER — Emergency Department (HOSPITAL_COMMUNITY): Payer: BC Managed Care – PPO

## 2018-10-26 DIAGNOSIS — R197 Diarrhea, unspecified: Secondary | ICD-10-CM | POA: Insufficient documentation

## 2018-10-26 DIAGNOSIS — R0789 Other chest pain: Secondary | ICD-10-CM

## 2018-10-26 DIAGNOSIS — R61 Generalized hyperhidrosis: Secondary | ICD-10-CM | POA: Insufficient documentation

## 2018-10-26 DIAGNOSIS — I10 Essential (primary) hypertension: Secondary | ICD-10-CM | POA: Insufficient documentation

## 2018-10-26 DIAGNOSIS — Z952 Presence of prosthetic heart valve: Secondary | ICD-10-CM | POA: Insufficient documentation

## 2018-10-26 DIAGNOSIS — F17210 Nicotine dependence, cigarettes, uncomplicated: Secondary | ICD-10-CM | POA: Insufficient documentation

## 2018-10-26 DIAGNOSIS — R531 Weakness: Secondary | ICD-10-CM | POA: Diagnosis not present

## 2018-10-26 DIAGNOSIS — Z79899 Other long term (current) drug therapy: Secondary | ICD-10-CM | POA: Insufficient documentation

## 2018-10-26 DIAGNOSIS — R112 Nausea with vomiting, unspecified: Secondary | ICD-10-CM | POA: Diagnosis not present

## 2018-10-26 LAB — COMPREHENSIVE METABOLIC PANEL
ALT: 22 U/L (ref 0–44)
AST: 21 U/L (ref 15–41)
Albumin: 4.2 g/dL (ref 3.5–5.0)
Alkaline Phosphatase: 48 U/L (ref 38–126)
Anion gap: 13 (ref 5–15)
BUN: 18 mg/dL (ref 8–23)
CO2: 24 mmol/L (ref 22–32)
Calcium: 9.5 mg/dL (ref 8.9–10.3)
Chloride: 102 mmol/L (ref 98–111)
Creatinine, Ser: 1.02 mg/dL — ABNORMAL HIGH (ref 0.44–1.00)
GFR calc Af Amer: >60 mL/min (ref >60)
GFR calc non Af Amer: 59 mL/min — ABNORMAL LOW (ref >60)
Glucose, Bld: 93 mg/dL (ref 70–99)
Potassium: 4.5 mmol/L (ref 3.5–5.1)
Sodium: 139 mmol/L (ref 135–145)
Total Bilirubin: 0.7 mg/dL (ref 0.3–1.2)
Total Protein: 7.4 g/dL (ref 6.5–8.1)

## 2018-10-26 LAB — CBC WITH DIFFERENTIAL/PLATELET
Abs Immature Granulocytes: 0.03 10*3/uL (ref 0.00–0.07)
Basophils Absolute: 0.1 10*3/uL (ref 0.0–0.1)
Basophils Relative: 1 %
Eosinophils Absolute: 0.2 10*3/uL (ref 0.0–0.5)
Eosinophils Relative: 3 %
HCT: 42.9 % (ref 36.0–46.0)
Hemoglobin: 14.2 g/dL (ref 12.0–15.0)
Immature Granulocytes: 0 %
Lymphocytes Relative: 27 %
Lymphs Abs: 2.3 10*3/uL (ref 0.7–4.0)
MCH: 30.9 pg (ref 26.0–34.0)
MCHC: 33.1 g/dL (ref 30.0–36.0)
MCV: 93.3 fL (ref 80.0–100.0)
Monocytes Absolute: 0.7 10*3/uL (ref 0.1–1.0)
Monocytes Relative: 9 %
Neutro Abs: 5.1 10*3/uL (ref 1.7–7.7)
Neutrophils Relative %: 60 %
Platelets: 263 10*3/uL (ref 150–400)
RBC: 4.6 MIL/uL (ref 3.87–5.11)
RDW: 12.3 % (ref 11.5–15.5)
WBC: 8.4 10*3/uL (ref 4.0–10.5)
nRBC: 0 % (ref 0.0–0.2)

## 2018-10-26 LAB — LIPASE, BLOOD: Lipase: 43 U/L (ref 11–51)

## 2018-10-26 LAB — TROPONIN I (HIGH SENSITIVITY)
Troponin I (High Sensitivity): 4 ng/L (ref <18)
Troponin I (High Sensitivity): 4 ng/L (ref <18)

## 2018-10-26 LAB — D-DIMER, QUANTITATIVE: D-Dimer, Quant: 0.43 ug/mL-FEU (ref 0.00–0.50)

## 2018-10-26 MED ORDER — SODIUM CHLORIDE 0.9% FLUSH: 3.0000 mL | Freq: Once | INTRAVENOUS | Status: DC

## 2018-10-26 NOTE — ED Triage Notes (Signed)
Pt states having cp last night with sweating and vomited x 1. Pt called PCP and was told to come to ED.

## 2018-10-26 NOTE — Telephone Encounter (Signed)
Pt called with c/o chest pain.  Yesterday PM she says she was doing fine, got up to walk to her deck, and had sudden "severe" chest pain associated with diaphoresis, vomiting, and weakness.  "The scariest thing that's ever happened to me".  Her symptoms gradually improved and lasted 20 minutes.  She is appropriately concerned because of her aortic valve surgery history (she had normal coronaries in '16) and she is still experiencing SSCP today, though not as severe.  I suggested she go to the ED for further evaluation, possible CTA.  Kerin Ransom PA-C 10/26/2018 11:42 AM

## 2018-10-26 NOTE — Discharge Instructions (Addendum)
Follow-up with your cardiologist this week.  Return sooner if any problems

## 2018-10-26 NOTE — ED Provider Notes (Signed)
[facility] EMERGENCY DEPARTMENT Provider Note   CSN: [id number] Arrival date & time: 10/26/18  1155     History   Chief Complaint Chief Complaint  Patient presents with   Chest Pain    HPI Michelle Patton is a 63 y.o. female.     HPI  Pt was seen at 1335. Per pt, c/o sudden onset and persistence of constant chest "pain" since last night approximately 2115. Pt describes the CP as "aching," and constant since onset. Pt states she was sitting in a chair when she developed generalized weakness followed by N/V x1 and "sweating." Pt states after N/V the CP began. Pt states she then needed help walking to the bathroom because she felt weak, then passed a large diarrheal stool. Pt states the CP has been constant since that time, but her other symptoms have resolved. Pt states she "looked my symptoms up on the Internet" and then called several people who told her to come to the ED for evaluation. Denies palpitations, no SOB/cough, no fevers, no abd pain, no rash, no injury, no fall, no LOC, no AMS, no focal motor weakness, no tingling/numbness in extremities, no black or blood in stools or emesis.     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   Redo sternotomy for 21 mm Medtronic Freestyle porcine aortic root graft w/ reimplantation of left main and right coronary arteries   Sleep apnea    diagnosed multiple years ago at [facility] aortic stenosis, congenital -  s/p repair during childhood     Patient Active Problem List   Diagnosis Date Noted   S/P right knee arthroscopy 01/10/18 01/18/2018   Chondromalacia, patella, right    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    Hx of adenomatous colonic polyps 12/28/2016   Abdominal pain, epigastric 08/10/2016   Left sided abdominal pain 08/10/2016   Esophageal dysphagia 08/10/2016   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   Supravalvular aortic stenosis, congenital - s/p repair during childhood    Essential hypertension    Hyperlipidemia    GERD (gastroesophageal reflux disease)    Aortic stenosis, severe 07/20/2014   Aortic regurgitation 07/20/2014   Leg cramps 09/26/2012   Occlusion and stenosis of carotid artery without mention of cerebral infarction 07/10/2012   Peripheral vascular disease, unspecified ([place]) 06/12/2012   Carotid artery bruit 06/12/2012   CONSTIPATION 12/28/2009   NAUSEA AND VOMITING 12/28/2009   DIARRHEA 12/28/2009    Past Surgical History:  Procedure Laterality Date   ABDOMINAL AORTAGRAM  06/24/12   ABDOMINAL AORTAGRAM N/A 06/24/2012   Procedure: ABDOMINAL Maxcine Ham;  Surgeon: Angelia Mould, MD;  Location: [facility] CATH LAB;  Service: Cardiovascular;  Laterality: N/A;   AORTIC VALVE REPLACEMENT N/A 10/07/2014   Procedure: REDO AORTIC VALVE REPLACEMENT (AVR);  Surgeon: Rexene Alberts, MD;  Location: [place];  Service: Open Heart Surgery;  Laterality: N/A;   ASCENDING AORTIC ROOT REPLACEMENT N/A 10/07/2014   Procedure: ASCENDING AORTIC ROOT REPLACEMENT;  Surgeon: Rexene Alberts, MD;  Location: [place];  Service: Open Heart Surgery;  Laterality: N/A;   BIOPSY  09/27/2016   Procedure: BIOPSY;  Surgeon: Daneil Dolin, MD;  [facility];  Service: Endoscopy;;  colon   BREAST REDUCTION SURGERY Bilateral 01/21/2018   Procedure: BREAST REDUCTION WITH LIPOSUCTION;  Surgeon: Cristine Polio, MD;   Location: [place];  Service: Plastics;  Laterality: Bilateral;   CARDIAC VALVE SURGERY  1968   CERVICAL FUSION     CHOLECYSTECTOMY     COLONOSCOPY WITH PROPOFOL N/A 09/27/2016   Procedure: COLONOSCOPY WITH PROPOFOL;  Surgeon: Daneil Dolin, MD;  [facility];  Service: Endoscopy;  Laterality: N/A;  1:00pm   ESOPHAGOGASTRODUODENOSCOPY (EGD) WITH PROPOFOL N/A 09/27/2016   Procedure: ESOPHAGOGASTRODUODENOSCOPY (EGD) WITH PROPOFOL;  Surgeon: Daneil Dolin, MD;  [facility];  Service: Endoscopy;  Laterality: N/A;   ILIAC ARTERY STENT Left 12/2007   KNEE ARTHROSCOPY WITH MEDIAL MENISECTOMY Right 01/10/2018   Procedure: RIGHT KNEE ARTHROSCOPY WITH PARTIAL MEDIAL MENISECTOMY;  Surgeon: Carole Civil, MD;  Location: [facility];  Service: Orthopedics;  Laterality: Right;   LEFT AND RIGHT HEART CATHETERIZATION WITH CORONARY ANGIOGRAM N/A 07/31/2014   Procedure: LEFT AND RIGHT HEART CATHETERIZATION WITH CORONARY ANGIOGRAM;  Surgeon: Burnell Blanks, MD;  Location[facility] C[facility] LAB;  Service: Cardiovascular;  Laterality: N/A;   MALONEY DILATION N/A 09/27/2016   Procedure: Venia Minks DILATION;  Surgeon: Daneil Dolin, MD;  [facility];  Service: Endoscopy;  Laterality: N/A;   POLYPECTOMY  09/27/2016   Procedure: POLYPECTOMY;  Surgeon: Daneil Dolin, MD;  [facility];  Service: Endoscopy;;  colon   ROTATOR CUFF REPAIR Bilateral    TEE WITHOUT CARDIOVERSION N/A 07/07/2014   Procedure: TRANSESOPHAGEAL ECHOCARDIOGRAM (TEE);  Surgeon: Arnoldo Lenis, MD;  [facility];  Service: Cardiology;  Laterality: N/A;   TEE WITHOUT CARDIOVERSION N/A 07/20/2014   Procedure: TRANSESOPHAGEAL ECHOCARDIOGRAM (TEE) WITH PROPOFOL;  Surgeon: Arnoldo Lenis, MD;  Location: [facility];  Service: Endoscopy;  Laterality: N/A;   TEE WITHOUT CARDIOVERSION N/A 10/07/2014   Procedure: TRANSESOPHAGEAL ECHOCARDIOGRAM (TEE);  Surgeon: Rexene Alberts, MD;   Location: [place];  Service: Open Heart Surgery;  Laterality: N/A;     OB History   No obstetric history on file.      Home Medications    Prior to Admission medications   Medication Sig Start Date End Date Taking? Authorizing Provider  NONFORMULARY OR COMPOUNDED ITEM Apply 1 Pump topically 2 (two) times daily as needed. Compounded Item: Anti-Inflammatory Cream: Diclofenac 3%/ Baclofen 2%/ Cyclobenzaprine 2%/ Lidocaine 2% 06/05/18  Yes [provider]  aspirin EC 81 MG tablet Take 81 mg by mouth daily.    [provider]  Cholecalciferol (VITAMIN D3) 5000 UNITS TABS Take 5,000 Units by mouth every morning.     [provider]  clonazePAM (KLONOPIN) 1 MG tablet Take 1 mg by mouth at bedtime.  05/11/17   [provider]  Cranberry-Vitamin C-Vitamin E (CRANBERRY PLUS VITAMIN C) 4200-20-3 MG-MG-UNIT CAPS Take 1 capsule by mouth every morning.     [provider]  diclofenac (VOLTAREN) 75 MG EC tablet TAKE 1 TABLET TWICE A DAY 04/26/18   Carole Civil, MD  estradiol (ESTRACE) 1 MG tablet Take 1 mg by mouth every morning.  [provider]  Flaxseed, Linseed, (FLAXSEED OIL) 1000 MG CAPS Take 1 capsule by mouth daily.    [provider]  furosemide (LASIX) 40 MG tablet Take 1 tablet (40 mg total) by mouth daily as needed for edema. Patient taking differently: Take 40 mg by mouth daily.  10/23/17 01/29/18  Arnoldo Lenis, MD  HYDROcodone-acetaminophen (NORCO/VICODIN) 5-325 MG tablet Take 1-2 tablets by mouth every 4 (four) hours as needed for moderate pain. 01/10/18   Carole Civil, MD  medroxyPROGESTERone (PROVERA) 2.5 MG tablet Take 2.5 mg by mouth every morning.     [provider]  metoprolol tartrate (LOPRESSOR) 25 MG tablet TAKE 1 TABLET TWICE A DAY 01/14/18   Arnoldo Lenis, MD  RABEprazole (ACIPHEX) 20 MG tablet Take 20 mg by mouth 2 (two) times daily.     [provider]  rosuvastatin (CRESTOR) 10  MG tablet Take 10 mg by mouth daily.  03/26/17   [provider]  vitamin E 400 UNIT capsule Take 400 Units by mouth daily.    [provider]  zolpidem (AMBIEN) 10 MG tablet Take 10 mg by mouth at bedtime as needed for sleep.  08/24/15   [provider]    Family History Family History  Problem Relation Age of Onset   Hypertension Mother    Hypertension Father    Heart disease Father        before age 72   Other Father        varicose veins   COPD Father    Colon cancer Neg Hx    Celiac disease Neg Hx    Inflammatory bowel disease Neg Hx     Social History Social History   Tobacco Use   Smoking status: Current Every Day Smoker    Packs/day: 0.50    Years: 40.00    Pack years: 20.00    Types: Cigarettes   Smokeless tobacco: Never Used   Tobacco comment: states she stopped 01-10-18  Substance Use Topics   Alcohol use: Yes    Alcohol/week: 4.0 standard drinks    Types: 4 Cans of beer per week   Drug use: No     Allergies   Penicillins and Sulfa antibiotics   Review of Systems Review of Systems ROS: Statement: All systems negative except as marked or noted in the HPI; Constitutional: Negative for fever and chills. +generalized weakness. ; ; Eyes: Negative for eye pain, redness and discharge. ; ; ENMT: Negative for ear pain, hoarseness, nasal congestion, sinus pressure and sore throat. ; ; Cardiovascular: +CP, diaphoresis. Negative for palpitations, dyspnea and peripheral edema. ; ; Respiratory: Negative for cough, wheezing and stridor. ; ; Gastrointestinal: +N/V/D. Negative for abdominal pain, blood in stool, hematemesis, jaundice and rectal bleeding. . ; ; Genitourinary: Negative for dysuria, flank pain and hematuria. ; ; Musculoskeletal: Negative for back pain and neck pain. Negative for swelling and trauma.; ; Skin: Negative for pruritus, rash, abrasions, blisters, bruising and skin lesion.; ; Neuro: Negative for headache,  lightheadedness and neck stiffness. Negative for altered level of consciousness, altered mental status, extremity weakness, paresthesias, involuntary movement, seizure and syncope.       Physical Exam Updated Vital Signs BP 135/75 (BP Location: Right Arm)    Pulse 70    Temp 98.8 F (37.1 C) (Oral)    Resp 18    SpO2 99%    14:17 Orthostatic Vital Signs TH  Orthostatic Lying   BP- Lying: 119/61  Pulse- Lying:  56      Orthostatic Sitting  BP- Sitting: 126/70  Pulse- Sitting: 64      Orthostatic Standing at 0 minutes  BP- Standing at 0 minutes: 121/74  Pulse- Standing at 0 minutes: 73     Physical Exam 1340: Physical examination:  Nursing notes reviewed; Vital signs and O2 SAT reviewed;  Constitutional: Well developed, Well nourished, Well hydrated, In no acute distress; Head:  Normocephalic, atraumatic; Eyes: EOMI, PERRL, No scleral icterus; ENMT: Mouth and pharynx normal, Mucous membranes moist; Neck: Supple, Full range of motion, No lymphadenopathy; Cardiovascular: Regular rate and rhythm, No gallop; Respiratory: Breath sounds clear & equal bilaterally, No wheezes.  Speaking full sentences with ease, Normal respiratory effort/excursion; Chest: Nontender, Movement normal; Abdomen: Soft, Nontender, Nondistended, Normal bowel sounds; Genitourinary: No CVA tenderness; Extremities: Peripheral pulses normal, No tenderness, No edema, No calf edema or asymmetry.; Neuro: AA&Ox3, Major CN grossly intact.  Speech clear. No gross focal motor or sensory deficits in extremities. Climbs on and off stretcher easily by herself. Gait steady..; Skin: Color normal, Warm, Dry.   ED Treatments / Results  Labs (all labs ordered are listed, but only abnormal results are displayed)   EKG EKG Interpretation  Date/Time:  Saturday October 26 2018 12:05:28 EDT Ventricular Rate:  64 PR Interval:  146 QRS Duration: 80 QT Interval:  430 QTC Calculation: 443 R Axis:   49 Text Interpretation:  Normal  sinus rhythm Septal infarct , age undetermined Baseline wander When compared with ECG of 09/22/2016 No significant change was found Confirmed by Francine Graven [id number]) on 10/26/2018 1:37:51 PM   Radiology   Procedures Procedures (including critical care time)  Medications Ordered in ED Medications  sodium chloride flush (NS) 0.9 % injection 3 mL (has no administration in time range)     Initial Impression / Assessment and Plan / ED Course  I have reviewed the triage vital signs and the nursing notes.  Pertinent labs & imaging results that were available during my care of the patient were reviewed by me and considered in my medical decision making (see chart for details).     MDM Reviewed: previous chart, nursing note and vitals Reviewed previous: labs and ECG Interpretation: labs, ECG and x-ray    Results for orders placed or performed during the hospital encounter of 10/26/18  Troponin I (High Sensitivity)  Result Value Ref Range   Troponin I (High Sensitivity) 4.00 <18 ng/L  D-dimer, quantitative  Result Value Ref Range   D-Dimer, Quant 0.43 0.00 - 0.50 ug/mL-FEU  Comprehensive metabolic panel  Result Value Ref Range   Sodium 139 135 - 145 mmol/L   Potassium 4.5 3.5 - 5.1 mmol/L   Chloride 102 98 - 111 mmol/L   CO2 24 22 - 32 mmol/L   Glucose, Bld 93 70 - 99 mg/dL   BUN 18 8 - 23 mg/dL   Creatinine, Ser 1.02 (H) 0.44 - 1.00 mg/dL   Calcium 9.5 8.9 - 10.3 mg/dL   Total Protein 7.4 6.5 - 8.1 g/dL   Albumin 4.2 3.5 - 5.0 g/dL   AST 21 15 - 41 U/L   ALT 22 0 - 44 U/L   Alkaline Phosphatase 48 38 - 126 U/L   Total Bilirubin 0.7 0.3 - 1.2 mg/dL   GFR calc non Af Amer 59 (L) >60 mL/min   GFR calc Af Amer >60 >60 mL/min   Anion gap 13 5 - 15  Lipase, blood  Result Value Ref Range   Lipase  43 11 - 51 U/L  CBC with Differential/Platelet  Result Value Ref Range   WBC 8.4 4.0 - 10.5 K/uL   RBC 4.60 3.87 - 5.11 MIL/uL   Hemoglobin 14.2 12.0 - 15.0 g/dL   HCT 42.9  36.0 - 46.0 %   MCV 93.3 80.0 - 100.0 fL   MCH 30.9 26.0 - 34.0 pg   MCHC 33.1 30.0 - 36.0 g/dL   RDW 12.3 11.5 - 15.5 %   Platelets 263 150 - 400 K/uL   nRBC 0.0 0.0 - 0.2 %   Neutrophils Relative % 60 %   Neutro Abs 5.1 1.7 - 7.7 K/uL   Lymphocytes Relative 27 %   Lymphs Abs 2.3 0.7 - 4.0 K/uL   Monocytes Relative 9 %   Monocytes Absolute 0.7 0.1 - 1.0 K/uL   Eosinophils Relative 3 %   Eosinophils Absolute 0.2 0.0 - 0.5 K/uL   Basophils Relative 1 %   Basophils Absolute 0.1 0.0 - 0.1 K/uL   Immature Granulocytes 0 %   Abs Immature Granulocytes 0.03 0.00 - 0.07 K/uL   Dg Chest 2 View Result Date: 10/26/2018 CLINICAL DATA:  Patient with mid chest pain radiating to the left. EXAM: CHEST - 2 VIEW COMPARISON:  Chest radiograph 12/22/2014 FINDINGS: Stable enlarged cardiac and mediastinal contours status post median sternotomy. Bandlike opacity left mid lung. No large area pulmonary consolidation. No pleural effusion or pneumothorax. Thoracic spine degenerative changes. IMPRESSION: Bandlike opacity left mid lung may represent atelectasis or scarring. No acute process. Electronically Signed   By: Lovey Newcomer M.D.   On: 10/26/2018 12:52    1510: Not orthostatic on VS. D-dimer and Initial trop reassuring. Pending delta trop. Sign out to Dr. Roderic Palau.    Final Clinical Impressions(s) / ED Diagnoses   Final diagnoses:  None    ED Discharge Orders    None       Francine Graven, DO 10/26/18 1514

## 2018-12-09 ENCOUNTER — Other Ambulatory Visit: Payer: Self-pay

## 2018-12-09 ENCOUNTER — Telehealth: Payer: Self-pay

## 2018-12-09 ENCOUNTER — Ambulatory Visit (INDEPENDENT_AMBULATORY_CARE_PROVIDER_SITE_OTHER): Payer: BC Managed Care – PPO | Admitting: Family Medicine

## 2018-12-09 VITALS — BP 158/78 | HR 66 | Temp 98.2°F | Ht 66.0 in | Wt 226.8 lb

## 2018-12-09 DIAGNOSIS — R252 Cramp and spasm: Secondary | ICD-10-CM | POA: Diagnosis not present

## 2018-12-09 DIAGNOSIS — Z1239 Encounter for other screening for malignant neoplasm of breast: Secondary | ICD-10-CM | POA: Insufficient documentation

## 2018-12-09 DIAGNOSIS — I739 Peripheral vascular disease, unspecified: Secondary | ICD-10-CM

## 2018-12-09 DIAGNOSIS — Z86010 Personal history of colonic polyps: Secondary | ICD-10-CM

## 2018-12-09 DIAGNOSIS — R5383 Other fatigue: Secondary | ICD-10-CM

## 2018-12-09 DIAGNOSIS — R5382 Chronic fatigue, unspecified: Secondary | ICD-10-CM

## 2018-12-09 DIAGNOSIS — E7849 Other hyperlipidemia: Secondary | ICD-10-CM | POA: Diagnosis not present

## 2018-12-09 MED ORDER — ZOLPIDEM TARTRATE 5 MG PO TABS: 5.0000 mg | ORAL_TABLET | Freq: Every evening | ORAL | 1 refills | Status: DC | PRN

## 2018-12-09 NOTE — Patient Instructions (Addendum)
Mammogram Neurology -leg pain Fasting labwork-[facility]-Lipid panel/TSH Schedule for pap smear at pts convenience Colonoscopy referral to GI -h/o colon polyps

## 2018-12-09 NOTE — Progress Notes (Signed)
New Patient Office Visit  Subjective:  Patient ID: Michelle Patton, female    DOB: [birth year]  Age: 63 y.o. MRN: [id number]  GX:QJJHE medication refills  HPI Michelle Patton presents for insomnia, leg pain, aortic stenosis, HTN  Dr. Branch-cardiology-CP noted intermittent-pt has not seen cardiology-aortic stenosis-patch placed-2015 CHF-replacement valve-10/26/18 atypical chest pain diagnosed-pt was outside and heat caused CP-nausea associated.  Lopressor 25mg  BID and Lasix as needed-per cardiology. Pts home bp 135/70's-takes bp -monthly-Lopressor  GYN-long term estrogen/progesterone-endometrial ablation-started 10 years ago menopausal symptoms-pt states starting to wean off medication and hot flashes reoccurred. Cardiology aware pt is taking HRT with no concerns per pt Pap smear-normal exam 10 year- no change in partner-pt went to GYN yearly prior.  Pt with NO mammogram at [facility] reduction 2019-no mammogram for 7-8 years.  9/19 tissue normal from breast reduction  Lab cramps-takes clonazepam-seen by vascular and neurology in the past for diagnosis and treatment  Hyperlipidemia-crestor in the past-no recent lipid panel   Prescriptions Total Prescriptions: 20   Total Private Pay: 1   Fill Date ID   Written Drug Qty Days Prescriber Rx # Pharmacy Refill   Daily Dose* Pymt Type PMP    09/12/2018  1   09/09/2018  Clonazepam 1 MG Tablet  90.00 90 Cy But   [id number]   Exp (4317)   0  2.00 LME  Comm Ins   [place]  09/12/2018  1   09/09/2018  Zolpidem Tartrate 10 MG Tablet  90.00 90 Cy But   [id number]   Exp (4317)   0  0.50 LME  Comm Ins   [place]  06/18/2018  1   06/17/2018  Clonazepam 1 MG Tablet  90.00 90 Cy But   [id number]   Exp (4317)   0  2.00 LME  Comm Ins   [place]  05/26/2018  1   05/23/2018  Zolpidem Tartrate 10 MG Tablet  90.00 90 Cy But   [id number]   Exp (4317)   0  0.50 LME  Comm Ins   [place]  03/06/2018  1   10/22/2017  Zolpidem Tartrate 10 MG Tablet  30.00 30 Cy But   [id number]   Exp  (4317)   3  0.50 LME  Comm Ins   [place]  02/05/2018  1   10/22/2017  Zolpidem Tartrate 10 MG Tablet  30.00 30 Cy But   [id number]   Exp (4317)   2  0.50 LME  Comm Ins   [place]  01/21/2018  1   01/21/2018  Oxycodone-Acetaminophen 10-325  30.00 7 Ge Tru   [id number]   Nor (6833)   0  64.29 MME  Private Pay   [place]  01/13/2018  1   10/22/2017  Zolpidem Tartrate 10 MG Tablet  30.00 30 Cy But   [id number]   Exp (4317)   1  0.50 LME  Comm Ins   [place]  01/10/2018  1   01/10/2018  Hydrocodone-Acetamin 5-325 MG  40.00 5 [address] [place]   [id number]   Nor (6833)   0  40.00 MME  Comm Ins   [place]  12/31/2017  1   10/18/2017  Clonazepam 1 MG Tablet  30.00 30 Cy But   [id number]   Nor (6833)   2  2.00 LME  Comm Ins   [place]  12/01/2017  1   10/18/2017  Clonazepam 1 MG Tablet  30.00 30 Cy But   [id number]   Nor (6833)   1  2.00 LME  Comm Ins  [place]  11/17/2017  1   11/17/2017  Tramadol Hcl 50 MG Tablet  30.00 5 Ta And   [id number]   Nor (6833)   0  30.00 MME  Comm Ins   [place]  10/26/2017  1   10/22/2017  Zolpidem Tartrate 10 MG Tablet  30.00 30 Cy But   [id number]   Exp (4317)   0  0.50 LME  Comm Ins   [place]  10/18/2017  1   10/18/2017  Clonazepam 1 MG Tablet  30.00 30 Cy But   [id number]   Nor (6833)   0  2.00 LME  Comm Ins   [place]  09/18/2017  1   06/19/2017  Clonazepam 1 MG Tablet  30.00 30 Cy But   [id number]   Nor (6833)   3  2.00 LME        Past Medical History:  Diagnosis Date  . Anxiety   . Aortic regurgitation 07/20/2014  . Aortic stenosis, severe 07/20/2014  . Arthritis   . DVT (deep venous thrombosis) ([place])   . Family history of adverse reaction to anesthesia    Reports father deliurm in his 3's with CABG  . GERD (gastroesophageal reflux disease)   . History of pneumonia   . Hyperlipidemia   . Hypertension   . Insomnia, unspecified   . Muscle weakness (generalized)   . Peripheral artery disease ([place])   . 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 Sleep apnea    diagnosed multiple years ago at [facility]  . Supravalvular aortic stenosis, congenital - s/p repair during childhood     Past Surgical History:  Procedure Laterality Date  . ABDOMINAL AORTAGRAM  06/24/12  . ABDOMINAL AORTAGRAM N/A 06/24/2012   Procedure: ABDOMINAL Maxcine Ham;  Surgeon: Angelia Mould, MD;  Location: [facility] CATH LAB;  Service: Cardiovascular;  Laterality: N/A;  . AORTIC VALVE REPLACEMENT N/A 10/07/2014   Procedure: REDO AORTIC VALVE REPLACEMENT (AVR);  Surgeon: Rexene Alberts, MD;  Location: [place];  Service: Open Heart Surgery;  Laterality: N/A;  . ASCENDING AORTIC ROOT REPLACEMENT N/A 10/07/2014   Procedure: ASCENDING AORTIC ROOT REPLACEMENT;  Surgeon: Rexene Alberts, MD;  Location: [place];  Service: Open Heart Surgery;  Laterality: N/A;  . BIOPSY  09/27/2016   Procedure: BIOPSY;  Surgeon: Daneil Dolin, MD;  [facility];  Service: Endoscopy;;  colon  . BREAST REDUCTION SURGERY Bilateral 01/21/2018   Procedure: BREAST REDUCTION WITH LIPOSUCTION;  Surgeon: Cristine Polio, MD;  Location: [place];  Service: Plastics;  Laterality: Bilateral;  . [place]  . CERVICAL FUSION    . CHOLECYSTECTOMY    . COLONOSCOPY WITH PROPOFOL N/A 09/27/2016   Procedure: COLONOSCOPY WITH PROPOFOL;  Surgeon: Daneil Dolin, MD;  [facility];  Service: Endoscopy;  Laterality: N/A;  1:00pm  . ESOPHAGOGASTRODUODENOSCOPY (EGD) WITH PROPOFOL N/A 09/27/2016   Procedure: ESOPHAGOGASTRODUODENOSCOPY (EGD) WITH PROPOFOL;  Surgeon: Daneil Dolin, MD;  [facility];  Service: Endoscopy;  Laterality: N/A;  . ILIAC ARTERY STENT Left 12/2007  . KNEE ARTHROSCOPY WITH MEDIAL MENISECTOMY Right 01/10/2018   Procedure: RIGHT KNEE ARTHROSCOPY WITH PARTIAL MEDIAL MENISECTOMY;  Surgeon: Carole Civil, MD;  Location: [facility];  Service: Orthopedics;   Laterality: Right;  . LEFT AND RIGHT HEART CATHETERIZATION  WITH CORONARY ANGIOGRAM N/A 07/31/2014   Procedure: LEFT AND RIGHT HEART CATHETERIZATION WITH CORONARY ANGIOGRAM;  Surgeon: Burnell Blanks, MD;  Location: [facility] CATH LAB;  Service: Cardiovascular;  Laterality: N/A;  . Venia Minks DILATION N/A 09/27/2016   Procedure: Venia Minks DILATION;  Surgeon: Daneil Dolin, MD;  [facility];  Service: Endoscopy;  Laterality: N/A;  . POLYPECTOMY  09/27/2016   Procedure: POLYPECTOMY;  Surgeon: Daneil Dolin, MD;  [facility];  Service: Endoscopy;;  colon  . ROTATOR CUFF REPAIR Bilateral   . TEE WITHOUT CARDIOVERSION N/A 07/07/2014   Procedure: TRANSESOPHAGEAL ECHOCARDIOGRAM (TEE);  Surgeon: Arnoldo Lenis, MD;  [facility];  Service: Cardiology;  Laterality: N/A;  . TEE WITHOUT CARDIOVERSION N/A 07/20/2014   Procedure: TRANSESOPHAGEAL ECHOCARDIOGRAM (TEE) WITH PROPOFOL;  Surgeon: Arnoldo Lenis, MD;  Location: [facility];  Service: Endoscopy;  Laterality: N/A;  . TEE WITHOUT CARDIOVERSION N/A 10/07/2014   Procedure: TRANSESOPHAGEAL ECHOCARDIOGRAM (TEE);  Surgeon: Rexene Alberts, MD;  Location: [place];  Service: Open Heart Surgery;  Laterality: N/A;    Family History  Problem Relation Age of Onset  . Hypertension Mother   . Hypertension Father   . Heart disease Father        before age 34  . Other Father        varicose veins  . COPD Father   . Colon cancer Neg Hx   . Celiac disease Neg Hx   . Inflammatory bowel disease Neg Hx     Social History   Socioeconomic History  . Marital status: Married    Spouse name: Not on file  . Number of children: 0  . Years of education: some coll  . Highest education level: Not on file  Occupational History  . Occupation: call center    Employer: [employer]    Comment: [employer]  Social Needs  . Financial resource strain: Not on file  . Food insecurity    Worry: Not on file    Inability: Not on file  . Transportation needs     Medical: Not on file    Non-medical: Not on file  Tobacco Use  . Smoking status: Current Every Day Smoker    Packs/day: 0.50    Years: 40.00    Pack years: 20.00    Types: Cigarettes  . Smokeless tobacco: Never Used  . Tobacco comment: states she stopped 01-10-18  Substance and Sexual Activity  . Alcohol use: Yes    Alcohol/week: 4.0 standard drinks    Types: 4 Cans of beer per week  . Drug use: No  . Sexual activity: Yes    Birth control/protection: None  Lifestyle  . Physical activity    Days per week: Not on file    Minutes per session: Not on file  . Stress: Not on file  Relationships  . Social Herbalist on phone: Not on file    Gets together: Not on file    Attends religious service: Not on file    Active member of club or organization: Not on file    Attends meetings of clubs or organizations: Not on file    Relationship status: Not on file  . Intimate partner violence    Fear of current or ex partner: Not on file    Emotionally abused: Not on file    Physically abused: Not on file    Forced sexual activity: Not on file  Other Topics Concern  .  Not on file  Social History Narrative   Patient is single and lives at home with her partner. Patient works at [employer] center. Some college education. Patient drinks coffee and tea.    ROS Review of Systems  Constitutional: Negative for fatigue.  Cardiovascular: Positive for chest pain.  Gastrointestinal: Negative for nausea.  Musculoskeletal: Positive for arthralgias and gait problem.  Neurological: Negative for headaches.  Psychiatric/Behavioral: Negative for suicidal ideas. The patient is not nervous/anxious.   leg pain with walking-in the past vascular determined neurologic problem-previously primary care rx-benzo for leg pain Seen at ER for CP after calling cardiology who recommended ER for evaluation-Troponin negative Insomnia-takes Ambien-10mg  listed in chart-pt states she takes 5mg  some nights-#90  listed in 2/20 and 5/20  Objective:   Today's Vitals: BP (!) 158/78 (BP Location: Left Arm, Patient Position: Sitting, Cuff Size: Normal)   Pulse 66   Temp 98.2 F (36.8 C) (Oral)   Ht 5\' 6"  (1.676 m)   Wt 226 lb 12.8 oz (102.9 kg)   SpO2 98%   BMI 36.61 kg/m   Physical Exam Constitutional:      General: She is not in acute distress.    Appearance: Normal appearance. She is obese. She is not ill-appearing.  HENT:     Head: Normocephalic and atraumatic.     Right Ear: Tympanic membrane normal.     Left Ear: Tympanic membrane normal.  Neck:     Musculoskeletal: Normal range of motion and neck supple.  Cardiovascular:     Rate and Rhythm: Normal rate and regular rhythm.     Pulses: Normal pulses.     Heart sounds: Murmur present.  Pulmonary:     Effort: Pulmonary effort is normal.     Breath sounds: Normal breath sounds.  Musculoskeletal:        General: No swelling or tenderness.     Right lower leg: No edema.     Left lower leg: No edema.  Neurological:     Mental Status: She is alert.     Assessment & Plan:    Outpatient Encounter Medications as of 12/09/2018  Medication Sig  . clonazePAM (KLONOPIN) 1 MG tablet Take 1 mg by mouth once. As needed for leg cramps  . magnesium gluconate (MAGONATE) 500 MG tablet Take 500 mg by mouth 2 (two) times daily. At bedtime for legcramps  . aspirin EC 81 MG tablet Take 81 mg by mouth daily.  . Cholecalciferol (VITAMIN D3) 5000 UNITS TABS Take 5,000 Units by mouth every morning.   . diclofenac (VOLTAREN) 75 MG EC tablet TAKE 1 TABLET TWICE A DAY  . estradiol (ESTRACE) 1 MG tablet Take 1 mg by mouth every morning.   . Flaxseed, Linseed, (FLAXSEED OIL) 1000 MG CAPS Take 1 capsule by mouth daily.  . furosemide (LASIX) 40 MG tablet Take 1 tablet (40 mg total) by mouth daily as needed for edema. (Patient taking differently: Take 40 mg by mouth daily as needed. )  . medroxyPROGESTERone (PROVERA) 2.5 MG tablet Take 2.5 mg by mouth every  morning.   . metoprolol tartrate (LOPRESSOR) 25 MG tablet TAKE 1 TABLET TWICE A DAY  . RABEprazole (ACIPHEX) 20 MG tablet Take 20 mg by mouth 2 (two) times daily.   . vitamin E 400 UNIT capsule Take 400 Units by mouth daily.  [name] zolpidem (AMBIEN) 10 MG tablet Take 10 mg by mouth at bedtime as needed for sleep.    No facility-administered encounter medications on file as  of 12/09/2018.    1. Breast cancer screening Taking estrogen-d/w need pap smear and mammogram - MM Digital Screening; Future  2. Peripheral vascular disease, unspecified ([place]) Pt taking benzo for leg pain-states neuro concerns for leg pain not vascular  3. Leg cramps CMP wnl, CBC-nromal - Ambulatory referral to Neurology - TSH Pt feels neurologic origin -takes multiple vitamins including magnesium, Vit D  4. Other hyperlipidemia - Lipid panel-no recent evaluation - TSH Cmp, cbc normal in ER 7/20 5. Fatigue, unspecified type - TSH Follow-up:  Pap smear    6. Personal history of colonic polyps Need referral for colonoscopy - Ambulatory referral to Gastroenterology  LISA Hannah Beat, MD

## 2018-12-10 MED ORDER — ZOLPIDEM TARTRATE 5 MG PO TABS: 5.0000 mg | ORAL_TABLET | Freq: Every evening | ORAL | 1 refills | Status: DC | PRN

## 2018-12-10 NOTE — Telephone Encounter (Signed)
LeighAnn Eben Choinski, CMA  

## 2018-12-14 LAB — LIPID PANEL
Cholesterol: 282 mg/dL — ABNORMAL HIGH (ref <200)
HDL: 39 mg/dL — ABNORMAL LOW (ref >=50)
Non-HDL Cholesterol (Calc): 243 mg/dL (calc) — ABNORMAL HIGH (ref <130)
Total CHOL/HDL Ratio: 7.2 (calc) — ABNORMAL HIGH (ref <5.0)
Triglycerides: 457 mg/dL — ABNORMAL HIGH (ref <150)

## 2018-12-14 LAB — TSH: TSH: 2.6 mIU/L (ref 0.40–4.50)

## 2018-12-16 ENCOUNTER — Telehealth: Payer: Self-pay | Admitting: Family Medicine

## 2018-12-16 NOTE — Telephone Encounter (Signed)
Routing to Dr. Corum for advice ? 

## 2018-12-16 NOTE — Telephone Encounter (Signed)
Dr. Holly Bodily advised patient to be seen and an appointment was made with Mrs Sperbeck on Thurs Sept 3rd at 4:00

## 2018-12-16 NOTE — Telephone Encounter (Signed)
Patient is returning a call from the office, looks like Dr. Holly Bodily called patient in regards to lab results and wants patient to make a appointment to follow up to start on medications. I informed the patient and she states she does not need to come in just for Dr. Holly Bodily to give her medicine, that she reviewed her lab results on mychart and she can tell they are not normal. Patient states she seen Dr. Holly Bodily last week and she can not keep asking off of work to come back into the office. Informed patient this is what Dr. Holly Bodily would like but I would send a message to inform what patient said.

## 2018-12-16 NOTE — Progress Notes (Signed)
Left a message with her husband for patient to return our call

## 2018-12-26 ENCOUNTER — Ambulatory Visit (INDEPENDENT_AMBULATORY_CARE_PROVIDER_SITE_OTHER): Payer: BC Managed Care – PPO | Admitting: Family Medicine

## 2018-12-26 ENCOUNTER — Other Ambulatory Visit: Payer: Self-pay

## 2018-12-26 VITALS — BP 176/98 | HR 84 | Temp 97.1°F | Ht 66.0 in | Wt 222.8 lb

## 2018-12-26 DIAGNOSIS — E785 Hyperlipidemia, unspecified: Secondary | ICD-10-CM | POA: Diagnosis not present

## 2018-12-26 DIAGNOSIS — I10 Essential (primary) hypertension: Secondary | ICD-10-CM

## 2018-12-26 MED ORDER — ROSUVASTATIN CALCIUM 10 MG PO TABS: 10.0000 mg | ORAL_TABLET | Freq: Every day | ORAL | 1 refills | Status: DC

## 2018-12-26 NOTE — Progress Notes (Signed)
Established Patient Office Visit  Subjective:  Patient ID: Michelle Patton, female    DOB: 05-17-1955  Age: 63 y.o. MRN: [id number]  CC:  Chief Complaint  Patient presents with  . Medication Refill    HPI Michelle Patton presents for elevated cholesterol-pt states she was taken off medication by Dr. Melina Copa since cholesterol was doing so well. Pt previously with elevated cholesterol, +FH elevated cholesterol. Takes flax seed, can not tolerated fish. H/o aortic root replacement  Cardio-sees Dr. Clydene Laming aortic stenosis-intermittent cp-seen at ER 7/4-h/o occlusion and stenosis of carotid artery and aortic stenosis. Pt states bp better at home. Pt has not seen cardio in f/u form ER evaluation  Past Medical History:  Diagnosis Date  . Anxiety   . Aortic regurgitation 07/20/2014  . Aortic stenosis, severe 07/20/2014  . Arthritis   . DVT (deep venous thrombosis) ([place])   . Family history of adverse reaction to anesthesia    Reports father deliurm in his 30's with CABG  . GERD (gastroesophageal reflux disease)   . History of pneumonia   . Hyperlipidemia   . Hypertension   . Insomnia, unspecified   . Muscle weakness (generalized)   . Peripheral artery disease ([place])   . 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 Sleep apnea    diagnosed multiple years ago at [facility]  . Supravalvular aortic stenosis, congenital - s/p repair during childhood     Past Surgical History:  Procedure Laterality Date  . ABDOMINAL AORTAGRAM  06/24/12  . ABDOMINAL AORTAGRAM N/A 06/24/2012   Procedure: ABDOMINAL Maxcine Ham;  Surgeon: Angelia Mould, MD;  Location: [facility] CATH LAB;  Service: Cardiovascular;  Laterality: N/A;  . AORTIC VALVE REPLACEMENT N/A 10/07/2014   Procedure: REDO AORTIC VALVE REPLACEMENT (AVR);  Surgeon: Rexene Alberts, MD;   Location: [place];  Service: Open Heart Surgery;  Laterality: N/A;  . ASCENDING AORTIC ROOT REPLACEMENT N/A 10/07/2014   Procedure: ASCENDING AORTIC ROOT REPLACEMENT;  Surgeon: Rexene Alberts, MD;  Location: [place];  Service: Open Heart Surgery;  Laterality: N/A;  . BIOPSY  09/27/2016   Procedure: BIOPSY;  Surgeon: Daneil Dolin, MD;  [facility];  Service: Endoscopy;;  colon  . BREAST REDUCTION SURGERY Bilateral 01/21/2018   Procedure: BREAST REDUCTION WITH LIPOSUCTION;  Surgeon: Cristine Polio, MD;  Location: [place];  Service: Plastics;  Laterality: Bilateral;  . [place]  . CERVICAL FUSION    . CHOLECYSTECTOMY    . COLONOSCOPY WITH PROPOFOL N/A 09/27/2016   Procedure: COLONOSCOPY WITH PROPOFOL;  Surgeon: Daneil Dolin, MD;  [facility];  Service: Endoscopy;  Laterality: N/A;  1:00pm  . ESOPHAGOGASTRODUODENOSCOPY (EGD) WITH PROPOFOL N/A 09/27/2016   Procedure: ESOPHAGOGASTRODUODENOSCOPY (EGD) WITH PROPOFOL;  Surgeon: Daneil Dolin, MD;  [facility];  Service: Endoscopy;  Laterality: N/A;  . ILIAC ARTERY STENT Left 12/2007  . KNEE ARTHROSCOPY WITH MEDIAL MENISECTOMY Right 01/10/2018   Procedure: RIGHT KNEE ARTHROSCOPY WITH PARTIAL MEDIAL MENISECTOMY;  Surgeon: Carole Civil, MD;  [facility];  Service: Orthopedics;  Laterality: Right;  . LEFT AND RIGHT HEART CATHETERIZATION WITH CORONARY ANGIOGRAM N/A 07/31/2014   Procedure: LEFT AND RIGHT HEART CATHETERIZATION WITH CORONARY ANGIOGRAM;  Surgeon: Burnell Blanks, MD;  Location: [facility] CATH LAB;  Service: Cardiovascular;  Laterality:  N/A;  Venia Minks DILATION N/A 09/27/2016   Procedure: Venia Minks DILATION;  Surgeon: Daneil Dolin, MD;  [facility];  Service: Endoscopy;  Laterality: N/A;  . POLYPECTOMY  09/27/2016   Procedure: POLYPECTOMY;  Surgeon: Daneil Dolin, MD;  [facility];  Service: Endoscopy;;  colon  . ROTATOR CUFF REPAIR Bilateral    . TEE WITHOUT CARDIOVERSION N/A 07/07/2014   Procedure: TRANSESOPHAGEAL ECHOCARDIOGRAM (TEE);  Surgeon: Arnoldo Lenis, MD;  [facility];  Service: Cardiology;  Laterality: N/A;  . TEE WITHOUT CARDIOVERSION N/A 07/20/2014   Procedure: TRANSESOPHAGEAL ECHOCARDIOGRAM (TEE) WITH PROPOFOL;  Surgeon: Arnoldo Lenis, MD;  [facility];  Service: Endoscopy;  Laterality: N/A;  . TEE WITHOUT CARDIOVERSION N/A 10/07/2014   Procedure: TRANSESOPHAGEAL ECHOCARDIOGRAM (TEE);  Surgeon: Rexene Alberts, MD;  Location: [place];  Service: Open Heart Surgery;  Laterality: N/A;    Family History  Problem Relation Age of Onset  . Hypertension Mother   . Hypertension Father   . Heart disease Father        before age 12  . Other Father        varicose veins  . COPD Father   . Colon cancer Neg Hx   . Celiac disease Neg Hx   . Inflammatory bowel disease Neg Hx     Social History   Socioeconomic History  . Marital status: Married    Spouse name: Not on file  . Number of children: 0  . Years of education: some coll  . Highest education level: Not on file  Occupational History  . Occupation: call center    Employer: [employer]    Comment: [employer]  Social Needs  . Financial resource strain: Not on file  . Food insecurity    Worry: Not on file    Inability: Not on file  . Transportation needs    Medical: Not on file    Non-medical: Not on file  Tobacco Use  . Smoking status: Current Every Day Smoker    Packs/day: 0.50    Years: 40.00    Pack years: 20.00    Types: Cigarettes  . Smokeless tobacco: Never Used  . Tobacco comment: states she stopped 01-10-18  Substance and Sexual Activity  . Alcohol use: Yes    Alcohol/week: 4.0 standard drinks    Types: 4 Cans of beer per week  . Drug use: No  . Sexual activity: Yes    Birth control/protection: None  Lifestyle  . Physical activity    Days per week: Not on file    Minutes per session: Not on file  . Stress: Not on file   Relationships  . Social Herbalist on phone: Not on file    Gets together: Not on file    Attends religious service: Not on file    Active member of club or organization: Not on file    Attends meetings of clubs or organizations: Not on file    Relationship status: Not on file  . Intimate partner violence    Fear of current or ex partner: Not on file    Emotionally abused: Not on file    Physically abused: Not on file    Forced sexual activity: Not on file  Other Topics Concern  . Not on file  Social History Narrative   Patient is single and lives at home with her partner. Patient works at [employer] center. Some college education. Patient drinks  coffee and tea.    Outpatient Medications Prior to Visit  Medication Sig Dispense Refill  . aspirin EC 81 MG tablet Take 81 mg by mouth daily.    . Cholecalciferol (VITAMIN D3) 5000 UNITS TABS Take 5,000 Units by mouth every morning.     . diclofenac (VOLTAREN) 75 MG EC tablet TAKE 1 TABLET TWICE A DAY 180 tablet 4  . estradiol (ESTRACE) 1 MG tablet Take 1 mg by mouth every morning.     . Flaxseed, Linseed, (FLAXSEED OIL) 1000 MG CAPS Take 1 capsule by mouth daily.    . furosemide (LASIX) 40 MG tablet Take 1 tablet (40 mg total) by mouth daily as needed for edema. (Patient taking differently: Take 40 mg by mouth daily as needed. ) 90 tablet 3  . magnesium gluconate (MAGONATE) 500 MG tablet Take 500 mg by mouth 2 (two) times daily. At bedtime for legcramps    . medroxyPROGESTERone (PROVERA) 2.5 MG tablet Take 2.5 mg by mouth every morning.     . metoprolol tartrate (LOPRESSOR) 25 MG tablet TAKE 1 TABLET TWICE A DAY 180 tablet 4  . RABEprazole (ACIPHEX) 20 MG tablet Take 20 mg by mouth 2 (two) times daily.     . vitamin E 400 UNIT capsule Take 400 Units by mouth daily.    [name] zolpidem (AMBIEN) 5 MG tablet Take 1 tablet (5 mg total) by mouth at bedtime as needed for sleep. 30 tablet 1   No facility-administered medications prior to  visit.     Allergies  Allergen Reactions  . Penicillins Hives    Has patient had a PCN reaction causing immediate rash, facial/tongue/throat swelling, SOB or lightheadedness with hypotension: Yes Has patient had a PCN reaction causing severe rash involving mucus membranes or skin necrosis: No Has patient had a PCN reaction that required hospitalization No Has patient had a PCN reaction occurring within the last 10 years: No If all of the above answers are "NO", then may proceed with Cephalosporin use.  Have taken Keflex before without any adverse reaction.   . Sulfa Antibiotics Nausea And Vomiting    ROS Review of Systems  Cardiovascular:       Elevated bp H/o of elevated cholesterol      Objective:    Physical Exam  Constitutional: She is oriented to person, place, and time. She appears well-developed and well-nourished. No distress.  Neurological: She is alert and oriented to person, place, and time.   Counseling-d/w pt risk/benefit/side effects Crestor-pt has previously taken with no concerns-taken off when cholesterol was doing well. No change in diet BP (!) 176/98 (BP Location: Left Arm, Patient Position: Sitting, Cuff Size: Large)   Pulse 84   Temp (!) 97.1 F (36.2 C) (Oral)   Ht 5\' 6"  (1.676 m)   Wt 222 lb 12.8 oz (101.1 kg)   SpO2 96%   BMI 35.96 kg/m  Wt Readings from Last 3 Encounters:  12/26/18 222 lb 12.8 oz (101.1 kg)  12/09/18 226 lb 12.8 oz (102.9 kg)  02/08/18 218 lb (98.9 kg)     Health Maintenance Due  Topic Date Due  . Hepatitis C Screening  27-Nov-1955  . HIV Screening  02/17/1972  . TETANUS/TDAP  02/17/1976  . PAP SMEAR-Modifier  02/16/1978  . MAMMOGRAM  02/17/2007  . INFLUENZA VACCINE  11/23/2018     Lab Results  Component Value Date   TSH 2.60 12/13/2018   Lab Results  Component Value Date   WBC 8.4 10/26/2018  HGB 14.2 10/26/2018   HCT 42.9 10/26/2018   MCV 93.3 10/26/2018   PLT 263 10/26/2018   Lab Results  Component  Value Date   NA 139 10/26/2018   K 4.5 10/26/2018   CO2 24 10/26/2018   GLUCOSE 93 10/26/2018   BUN 18 10/26/2018   CREATININE 1.02 (H) 10/26/2018   BILITOT 0.7 10/26/2018   ALKPHOS 48 10/26/2018   AST 21 10/26/2018   ALT 22 10/26/2018   PROT 7.4 10/26/2018   ALBUMIN 4.2 10/26/2018   CALCIUM 9.5 10/26/2018   ANIONGAP 13 10/26/2018   Lab Results  Component Value Date   CHOL 282 (H) 12/13/2018   Lab Results  Component Value Date   HDL 39 (L) 12/13/2018   Lab Results  Component Value Date   [facility]  12/13/2018     Comment:     . LDL cholesterol not calculated. Triglyceride levels greater than 400 mg/dL invalidate calculated LDL results. . Reference range: <100 . Desirable range <100 mg/dL for primary prevention;   <70 mg/dL for patients with CHD or diabetic patients  with > or = 2 CHD risk factors. [name] LDL-C is now calculated using the Martin-Hopkins  calculation, which is a validated novel method providing  better accuracy than the Friedewald equation in the  estimation of LDL-C.  Cresenciano Genre et al. Annamaria Helling. [id number]): 2061-2068  (http://education.[employer].com/faq/FAQ164)    Lab Results  Component Value Date   TRIG 457 (H) 12/13/2018   Lab Results  Component Value Date   CHOLHDL 7.2 (H) 12/13/2018   Lab Results  Component Value Date   HGBA1C 5.5 10/05/2014      Assessment & Plan:  1. Hyperlipidemia, unspecified hyperlipidemia type Started Crestor 10mg  -recheck in 3 months  2. Essential hypertension Lopressor 25mg -rx, Lasix-continued elevation-pt to schedule with cardiology for follow up  Continue to take bp in the morning-ER for CP/SOB or other concerns prior to f/u appt  SCHEDULE pap smear at pts convenience-pt cancelled 9/15 follow up appt  CODE FOR TIME-15 min used to discuss medication, treatment, concerns about side effects, current cholesterol and need to follow up for repeat labwork in 3 months LISA Hannah Beat, MD

## 2018-12-27 ENCOUNTER — Emergency Department (HOSPITAL_COMMUNITY)
Admission: EM | Admit: 2018-12-27 | Discharge: 2018-12-27 | Disposition: A | Discharge status: Home / Self Care | Payer: BC Managed Care – PPO | Attending: Emergency Medicine | Admitting: Emergency Medicine

## 2018-12-27 ENCOUNTER — Other Ambulatory Visit: Payer: Self-pay

## 2018-12-27 ENCOUNTER — Emergency Department (HOSPITAL_COMMUNITY): Payer: BC Managed Care – PPO

## 2018-12-27 ENCOUNTER — Telehealth: Payer: Self-pay | Admitting: Cardiology

## 2018-12-27 ENCOUNTER — Encounter (HOSPITAL_COMMUNITY): Payer: Self-pay

## 2018-12-27 DIAGNOSIS — Z79899 Other long term (current) drug therapy: Secondary | ICD-10-CM | POA: Diagnosis not present

## 2018-12-27 DIAGNOSIS — I10 Essential (primary) hypertension: Secondary | ICD-10-CM | POA: Diagnosis not present

## 2018-12-27 DIAGNOSIS — R079 Chest pain, unspecified: Secondary | ICD-10-CM

## 2018-12-27 DIAGNOSIS — Z7982 Long term (current) use of aspirin: Secondary | ICD-10-CM | POA: Diagnosis not present

## 2018-12-27 DIAGNOSIS — R0789 Other chest pain: Secondary | ICD-10-CM | POA: Diagnosis not present

## 2018-12-27 DIAGNOSIS — F17210 Nicotine dependence, cigarettes, uncomplicated: Secondary | ICD-10-CM | POA: Diagnosis not present

## 2018-12-27 LAB — BASIC METABOLIC PANEL
Anion gap: 8 (ref 5–15)
BUN: 12 mg/dL (ref 8–23)
CO2: 26 mmol/L (ref 22–32)
Calcium: 9.3 mg/dL (ref 8.9–10.3)
Chloride: 103 mmol/L (ref 98–111)
Creatinine, Ser: 0.95 mg/dL (ref 0.44–1.00)
GFR calc Af Amer: >60 mL/min (ref >60)
GFR calc non Af Amer: >60 mL/min (ref >60)
Glucose, Bld: 89 mg/dL (ref 70–99)
Potassium: 3.9 mmol/L (ref 3.5–5.1)
Sodium: 137 mmol/L (ref 135–145)

## 2018-12-27 LAB — CBC
HCT: 45.2 % (ref 36.0–46.0)
Hemoglobin: 14.6 g/dL (ref 12.0–15.0)
MCH: 30.2 pg (ref 26.0–34.0)
MCHC: 32.3 g/dL (ref 30.0–36.0)
MCV: 93.4 fL (ref 80.0–100.0)
Platelets: 263 10*3/uL (ref 150–400)
RBC: 4.84 MIL/uL (ref 3.87–5.11)
RDW: 11.6 % (ref 11.5–15.5)
WBC: 8.2 10*3/uL (ref 4.0–10.5)
nRBC: 0 % (ref 0.0–0.2)

## 2018-12-27 LAB — TROPONIN I (HIGH SENSITIVITY)
Troponin I (High Sensitivity): 4 ng/L (ref <18)
Troponin I (High Sensitivity): 4 ng/L (ref <18)

## 2018-12-27 MED ORDER — SODIUM CHLORIDE 0.9% FLUSH: 3.0000 mL | Freq: Once | INTRAVENOUS | Status: DC

## 2018-12-27 MED ORDER — IOHEXOL 350 MG/ML SOLN
100.0000 mL | Freq: Once | INTRAVENOUS | Status: AC | PRN
  Administered 2018-12-27: 18:00:00 100 mL via INTRAVENOUS

## 2018-12-27 NOTE — ED Provider Notes (Signed)
[facility] EMERGENCY DEPARTMENT Provider Note   CSN: [id number] Arrival date & time: 12/27/18  1335     History   Chief Complaint Chief Complaint  Patient presents with  . Chest Pain    HPI Michelle Patton is a 63 y.o. female.     HPI Patient presents with chest pain.  States began last night when she was going to bed.  States began after she laid down.  Was in her upper chest and went up to her jaw.  Has had somewhat constantly since then.  Pain still there but improved compared to last night.  States she called her cardiologist and was told to come in.  History of aortic root replacement.  4 years ago had clean coronary arteries.  Was seen in the ER around 2 months ago for chest pain but states that with a different chest pain.  No numbness or weakness.  No fevers chills or cough.  Has not had any exertional chest pain.  Sees Dr. Harl Bowie from cardiology.  Patient states she is under a lot of stress at work. Past Medical History:  Diagnosis Date  . Anxiety   . Aortic regurgitation 07/20/2014  . Aortic stenosis, severe 07/20/2014  . Arthritis   . DVT (deep venous thrombosis) ([place])   . Family history of adverse reaction to anesthesia    Reports father deliurm in his 7's with CABG  . GERD (gastroesophageal reflux disease)   . History of pneumonia   . Hyperlipidemia   . Hypertension   . Insomnia, unspecified   . Muscle weakness (generalized)   . Peripheral artery disease ([place])   . 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 Sleep apnea    diagnosed multiple years ago at [facility]  . Supravalvular aortic stenosis, congenital - s/p repair during childhood     Patient Active Problem List   Diagnosis Date Noted  . Fatigue 12/09/2018  . Breast cancer screening 12/09/2018  . S/P right knee arthroscopy 01/10/18  01/18/2018  . Chondromalacia, patella, right   . 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 Personal history of colonic polyps 12/28/2016  . Abdominal pain, epigastric 08/10/2016  . Left sided abdominal pain 08/10/2016  . Esophageal dysphagia 08/10/2016  . 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 Supravalvular aortic stenosis, congenital - s/p repair during childhood   . Essential hypertension   . Hyperlipidemia   . GERD (gastroesophageal reflux disease)   . Aortic stenosis, severe 07/20/2014  . Aortic regurgitation 07/20/2014  . Leg cramps 09/26/2012  . Occlusion and stenosis of carotid artery without mention of cerebral infarction 07/10/2012  . Peripheral vascular disease, unspecified ([place]) 06/12/2012  . Carotid artery bruit 06/12/2012  . CONSTIPATION 12/28/2009  . NAUSEA AND VOMITING 12/28/2009  . DIARRHEA 12/28/2009    Past Surgical History:  Procedure Laterality Date  . ABDOMINAL AORTAGRAM  06/24/12  . ABDOMINAL AORTAGRAM N/A 06/24/2012   Procedure: ABDOMINAL Maxcine Ham;  Surgeon: Angelia Mould, MD;  Location: [facility] CATH LAB;  Service: Cardiovascular;  Laterality: N/A;  . AORTIC VALVE REPLACEMENT N/A 10/07/2014   Procedure: REDO AORTIC VALVE REPLACEMENT (AVR);  Surgeon: Rexene Alberts, MD;  Location: [place];  Service: Open Heart Surgery;  Laterality: N/A;  . ASCENDING AORTIC ROOT REPLACEMENT N/A 10/07/2014  Procedure: ASCENDING AORTIC ROOT REPLACEMENT;  Surgeon: Rexene Alberts, MD;  Location: [place];  Service: Open Heart Surgery;  Laterality: N/A;  . BIOPSY  09/27/2016   Procedure: BIOPSY;  Surgeon: Daneil Dolin, MD;  [facility];  Service: Endoscopy;;  colon  . BREAST REDUCTION SURGERY Bilateral 01/21/2018   Procedure: BREAST REDUCTION WITH LIPOSUCTION;  Surgeon: Cristine Polio, MD;  Location: [place];  Service: Plastics;  Laterality: Bilateral;  . [place]  . CERVICAL  FUSION    . CHOLECYSTECTOMY    . COLONOSCOPY WITH PROPOFOL N/A 09/27/2016   Procedure: COLONOSCOPY WITH PROPOFOL;  Surgeon: Daneil Dolin, MD;  [facility];  Service: Endoscopy;  Laterality: N/A;  1:00pm  . ESOPHAGOGASTRODUODENOSCOPY (EGD) WITH PROPOFOL N/A 09/27/2016   Procedure: ESOPHAGOGASTRODUODENOSCOPY (EGD) WITH PROPOFOL;  Surgeon: Daneil Dolin, MD;  [facility];  Service: Endoscopy;  Laterality: N/A;  . ILIAC ARTERY STENT Left 12/2007  . KNEE ARTHROSCOPY WITH MEDIAL MENISECTOMY Right 01/10/2018   Procedure: RIGHT KNEE ARTHROSCOPY WITH PARTIAL MEDIAL MENISECTOMY;  Surgeon: Carole Civil, MD;  Location: [facility];  Service: Orthopedics;  Laterality: Right;  . LEFT AND RIGHT HEART CATHETERIZATION WITH CORONARY ANGIOGRAM N/A 07/31/2014   Procedure: LEFT AND RIGHT HEART CATHETERIZATION WITH CORONARY ANGIOGRAM;  Surgeon: Burnell Blanks, MD;  Location: [facility] CATH LAB;  Service: Cardiovascular;  Laterality: N/A;  . Venia Minks DILATION N/A 09/27/2016   Procedure: Venia Minks DILATION;  Surgeon: Daneil Dolin, MD;  [facility];  Service: Endoscopy;  Laterality: N/A;  . POLYPECTOMY  09/27/2016   Procedure: POLYPECTOMY;  Surgeon: Daneil Dolin, MD;  [facility];  Service: Endoscopy;;  colon  . ROTATOR CUFF REPAIR Bilateral   . TEE WITHOUT CARDIOVERSION N/A 07/07/2014   Procedure: TRANSESOPHAGEAL ECHOCARDIOGRAM (TEE);  Surgeon: Arnoldo Lenis, MD;  [facility];  Service: Cardiology;  Laterality: N/A;  . TEE WITHOUT CARDIOVERSION N/A 07/20/2014   Procedure: TRANSESOPHAGEAL ECHOCARDIOGRAM (TEE) WITH PROPOFOL;  Surgeon: Arnoldo Lenis, MD;  [facility];  Service: Endoscopy;  Laterality: N/A;  . TEE WITHOUT CARDIOVERSION N/A 10/07/2014   Procedure: TRANSESOPHAGEAL ECHOCARDIOGRAM (TEE);  Surgeon: Rexene Alberts, MD;  Location: [place];  Service: Open Heart Surgery;  Laterality: N/A;     OB History   No obstetric history on file.       Home Medications    Prior to Admission medications   Medication Sig Start Date End Date Taking? Authorizing Provider  aspirin EC 81 MG tablet Take 81 mg by mouth daily.    [provider]  Cholecalciferol (VITAMIN D3) 5000 UNITS TABS Take 5,000 Units by mouth every morning.     [provider]  diclofenac (VOLTAREN) 75 MG EC tablet TAKE 1 TABLET TWICE A DAY 04/26/18   Carole Civil, MD  estradiol (ESTRACE) 1 MG tablet Take 1 mg by mouth every morning.     [provider]  Flaxseed, Linseed, (FLAXSEED OIL) 1000 MG CAPS Take 1 capsule by mouth daily.    [provider]  furosemide (LASIX) 40 MG tablet Take 1 tablet (40 mg total) by mouth daily as needed for edema. Patient taking differently: Take 40 mg by mouth daily as needed.  10/23/17 10/26/18  Arnoldo Lenis, MD  magnesium gluconate (MAGONATE) 500 MG tablet Take 500 mg by mouth 2 (two) times daily. At bedtime for legcramps    [provider]  medroxyPROGESTERone (  PROVERA) 2.5 MG tablet Take 2.5 mg by mouth every morning.     [provider]  metoprolol tartrate (LOPRESSOR) 25 MG tablet TAKE 1 TABLET TWICE A DAY 01/14/18   Arnoldo Lenis, MD  RABEprazole (ACIPHEX) 20 MG tablet Take 20 mg by mouth 2 (two) times daily.     [provider]  rosuvastatin (CRESTOR) 10 MG tablet Take 1 tablet (10 mg total) by mouth daily. 12/26/18   Corum, Rex Kras, MD  vitamin E 400 UNIT capsule Take 400 Units by mouth daily.    [provider]  zolpidem (AMBIEN) 5 MG tablet Take 1 tablet (5 mg total) by mouth at bedtime as needed for sleep. 12/10/18   Corum, Rex Kras, MD    Family History Family History  Problem Relation Age of Onset  . Hypertension Mother   . Hypertension Father   . Heart disease Father        before age 27  . Other Father        varicose veins  . COPD Father   . Colon cancer Neg Hx   . Celiac disease Neg Hx   . Inflammatory bowel disease Neg Hx     Social  History Social History   Tobacco Use  . Smoking status: Current Every Day Smoker    Packs/day: 0.50    Years: 40.00    Pack years: 20.00    Types: Cigarettes  . Smokeless tobacco: Never Used  . Tobacco comment: states she stopped 01-10-18  Substance Use Topics  . Alcohol use: Yes    Alcohol/week: 4.0 standard drinks    Types: 4 Cans of beer per week  . Drug use: No     Allergies   Penicillins and Sulfa antibiotics   Review of Systems Review of Systems  Constitutional: Negative for appetite change.  HENT: Negative for congestion.   Respiratory: Negative for shortness of breath.   Cardiovascular: Positive for chest pain.  Gastrointestinal: Negative for abdominal pain.  Genitourinary: Negative for dysuria.  Musculoskeletal: Negative for back pain.  Skin: Negative for rash.  Neurological: Negative for weakness.  Psychiatric/Behavioral: Negative for confusion.     Physical Exam Updated Vital Signs BP (!) 153/76 (BP Location: Right Arm)   Pulse 75   Temp 98.3 F (36.8 C) (Oral)   Resp 18   Ht 5\' 6"  (1.676 m)   Wt 101.2 kg   SpO2 100%   BMI 35.99 kg/m   Physical Exam Vitals signs reviewed.  Constitutional:      Appearance: She is well-developed.  HENT:     Head: Normocephalic.  Eyes:     Pupils: Pupils are equal, round, and reactive to light.  Cardiovascular:     Rate and Rhythm: Normal rate and regular rhythm.  Pulmonary:     Effort: No respiratory distress.     Breath sounds: No stridor.  Musculoskeletal:     Right lower leg: She exhibits no tenderness.     Left lower leg: She exhibits no tenderness.  Skin:    General: Skin is warm.     Capillary Refill: Capillary refill takes less than 2 seconds.  Neurological:     Mental Status: She is alert.      ED Treatments / Results  Labs (all labs ordered are listed, but only abnormal results are displayed) Labs Reviewed  BASIC METABOLIC PANEL  CBC  TROPONIN I (HIGH SENSITIVITY)  TROPONIN I (HIGH  SENSITIVITY)    EKG EKG Interpretation  Date/Time:  Friday December 27 2018 13:42:48 EDT Ventricular Rate:  72 PR Interval:  150 QRS Duration: 80 QT Interval:  414 QTC Calculation: 453 R Axis:   67 Text Interpretation:  Normal sinus rhythm Septal infarct , age undetermined Abnormal ECG Confirmed by Davonna Belling [phone]) on 12/27/2018 3:40:52 PM   Radiology Dg Chest 2 View  Result Date: 12/27/2018 CLINICAL DATA:  Onset mid and left chest pain last night. EXAM: CHEST - 2 VIEW COMPARISON:  PA and lateral chest 10/26/2018 and single-view of the chest 12/22/2014. FINDINGS: Lungs are clear. Heart size is normal. The patient is status post CABG. No pneumothorax or pleural fluid. No acute or focal bony abnormality. IMPRESSION: No acute disease. Electronically Signed   By: Inge Rise M.D.   On: 12/27/2018 14:14    Procedures Procedures (including critical care time)  Medications Ordered in ED Medications  sodium chloride flush (NS) 0.9 % injection 3 mL (3 mLs Intravenous Not Given 12/27/18 1526)     Initial Impression / Assessment and Plan / ED Course  I have reviewed the triage vital signs and the nursing notes.  Pertinent labs & imaging results that were available during my care of the patient were reviewed by me and considered in my medical decision making (see chart for details).        Patient presents with chest pain.  Began last night but continues.  EKG reassuring.  Enzymes negative x2.  I think low risk cardiac overallBut does have outpatient follow-up with Dr. Harl Bowie.  EKG reassuring.  However CT chest done due to the nature of the pain and has had previous aortic valve and aortic root repair/replacement.  CT scan reassuring but also happened to be timed to rule out PE.  Discharge home with outpatient follow-up.  Final Clinical Impressions(s) / ED Diagnoses   Final diagnoses:  None    ED Discharge Orders    None       Davonna Belling, MD 12/27/18 1948

## 2018-12-27 NOTE — ED Triage Notes (Signed)
Pt presents to ED with complaints of chest pain, jaw pain started last night. Pt also with hypertension. Pt states pain is in left chest and describes as constant sharp pain.

## 2018-12-27 NOTE — Telephone Encounter (Signed)
Spoke with pt who reports having chest pain and jaw pain that started last night at 9 pm. Pt states that last night pain was 7/10 and today pain in 3/10. Pt denies SOB, N/V at this time. Pt is uneasy about going to ER d/t have a bad experience last time. Pt states that she will still be seen in ER today. Will forward to DOD.

## 2018-12-27 NOTE — Telephone Encounter (Signed)
Per pt phone call -- having problems with her BP 179/98, stating that she's waking up in the night with chest pain and jaw pain.

## 2019-01-02 ENCOUNTER — Other Ambulatory Visit: Payer: Self-pay

## 2019-01-02 ENCOUNTER — Ambulatory Visit (INDEPENDENT_AMBULATORY_CARE_PROVIDER_SITE_OTHER): Payer: BC Managed Care – PPO | Admitting: Family Medicine

## 2019-01-02 ENCOUNTER — Encounter: Payer: Self-pay | Admitting: Family Medicine

## 2019-01-02 DIAGNOSIS — Z23 Encounter for immunization: Secondary | ICD-10-CM | POA: Diagnosis not present

## 2019-01-03 NOTE — Progress Notes (Signed)
LeighAnn Han Lysne, CMA  

## 2019-01-07 ENCOUNTER — Encounter: Payer: BC Managed Care – PPO | Admitting: Family Medicine

## 2019-01-07 ENCOUNTER — Telehealth: Payer: Self-pay | Admitting: Family Medicine

## 2019-01-07 NOTE — Telephone Encounter (Signed)
Patient is calling to see if we found the last pneumonia vaccine that she received.

## 2019-01-07 NOTE — Telephone Encounter (Signed)
Yes she received Prevnar 06 June 2017 patient is aware

## 2019-01-09 ENCOUNTER — Other Ambulatory Visit: Payer: Self-pay

## 2019-01-09 ENCOUNTER — Ambulatory Visit (INDEPENDENT_AMBULATORY_CARE_PROVIDER_SITE_OTHER): Payer: BC Managed Care – PPO | Admitting: Family Medicine

## 2019-01-09 DIAGNOSIS — Z23 Encounter for immunization: Secondary | ICD-10-CM | POA: Diagnosis not present

## 2019-01-14 ENCOUNTER — Encounter: Payer: Self-pay | Admitting: Gastroenterology

## 2019-01-14 ENCOUNTER — Ambulatory Visit: Payer: BC Managed Care – PPO | Admitting: Gastroenterology

## 2019-01-14 ENCOUNTER — Other Ambulatory Visit: Payer: Self-pay

## 2019-01-14 VITALS — BP 145/77 | HR 70 | Temp 95.9°F | Ht 66.0 in | Wt 223.4 lb

## 2019-01-14 DIAGNOSIS — Z86010 Personal history of colonic polyps: Secondary | ICD-10-CM

## 2019-01-14 DIAGNOSIS — K219 Gastro-esophageal reflux disease without esophagitis: Secondary | ICD-10-CM

## 2019-01-14 DIAGNOSIS — R1010 Upper abdominal pain, unspecified: Secondary | ICD-10-CM | POA: Diagnosis not present

## 2019-01-14 NOTE — Progress Notes (Signed)
Primary Care Physician: Maryruth Hancock, MD  Primary Gastroenterologist:  Garfield Cornea, MD   Chief Complaint  Patient presents with  . Gastroesophageal Reflux    occ    HPI: Michelle Patton is a 63 y.o. female here for follow-up.  She was last seen in September 2018.  She has a history of GERD and diarrhea.  EGD and colonoscopy June 2018.  She had small hiatal hernia, mild Schatzki ring at the GE junction, required biopsy forceps for disruption.  LA grade A esophagitis noted.  Colonoscopy revealed diverticulosis, multiple semi-pedunculated polyps, ranging 4 to 7 mm in size were tubular adenomas, internal hemorrhoids grade 1, terminal ileum normal, segmental biopsies of the colon to evaluate for microscopic colitis were negative.  Recommended next colonoscopy June 2021.  Reflux well controlled. No dysphagia. No N/V.  Over the summer developed diarrhea that lasted for couple months.  Added fiber to the diet and this is now resolved.  Bowel movements soft.  Once daily.  No melena, brbpr. No weight loss.  Has vague sporadic abdominal pain occurring about once per month, lasting for minutes at a time.  Unrelated to meals or bowel function.  Current Outpatient Medications  Medication Sig Dispense Refill  . aspirin EC 81 MG tablet Take 81 mg by mouth daily.    . Cholecalciferol (VITAMIN D3) 5000 UNITS TABS Take 5,000 Units by mouth every morning.     . diclofenac (VOLTAREN) 75 MG EC tablet TAKE 1 TABLET TWICE A DAY (Patient taking differently: Take 75 mg by mouth 2 (two) times daily. **DO NOT CRUSH**) 180 tablet 4  . estradiol (ESTRACE) 1 MG tablet Take 1 mg by mouth every other day.     . Flaxseed, Linseed, (FLAXSEED OIL) 1000 MG CAPS Take 1 capsule by mouth daily.    . furosemide (LASIX) 40 MG tablet Take 1 tablet (40 mg total) by mouth daily as needed for edema. (Patient taking differently: Take 40 mg by mouth daily as needed. ) 90 tablet 3  . magnesium gluconate (MAGONATE) 500 MG tablet Take  500 mg by mouth 2 (two) times daily. At bedtime for legcramps    . medroxyPROGESTERone (PROVERA) 2.5 MG tablet Take 2.5 mg by mouth every other day.     . metoprolol tartrate (LOPRESSOR) 25 MG tablet TAKE 1 TABLET TWICE A DAY (Patient taking differently: Take 25 mg by mouth 2 (two) times daily. ) 180 tablet 4  . RABEprazole (ACIPHEX) 20 MG tablet Take 20 mg by mouth 2 (two) times daily.     . rosuvastatin (CRESTOR) 10 MG tablet Take 1 tablet (10 mg total) by mouth daily. 90 tablet 1  . vitamin E 400 UNIT capsule Take 400 Units by mouth daily.    [name] zolpidem (AMBIEN) 5 MG tablet Take 1 tablet (5 mg total) by mouth at bedtime as needed for sleep. 30 tablet 1   No current facility-administered medications for this visit.     Allergies as of 01/14/2019 - Review Complete 01/14/2019  Allergen Reaction Noted  . Penicillins Hives   . Sulfa antibiotics Nausea And Vomiting 06/19/2012    ROS:  General: Negative for anorexia, weight loss, fever, chills, fatigue, weakness. ENT: Negative for hoarseness, difficulty swallowing , nasal congestion. CV: Negative for chest pain, angina, palpitations, dyspnea on exertion, peripheral edema.  Respiratory: Negative for dyspnea at rest, dyspnea on exertion, cough, sputum, wheezing.  GI: See history of present illness. GU:  Negative for dysuria, hematuria, urinary incontinence,  urinary frequency, nocturnal urination.  Endo: Negative for unusual weight change.    Physical Examination:   BP (!) 145/77   Pulse 70   Temp (!) 95.9 F (35.5 C) (Temporal)   Ht 5\' 6"  (1.676 m)   Wt 223 lb 6.4 oz (101.3 kg)   BMI 36.06 kg/m   General: Well-nourished, well-developed in no acute distress.  Eyes: No icterus. Mouth: Oropharyngeal mucosa moist and pink , no lesions erythema or exudate. Lungs: Clear to auscultation bilaterally.  Heart: Regular rate and rhythm, no murmurs rubs or gallops.  Abdomen: Bowel sounds are normal, mild upper abdominal tenderness with  palpation, nondistended, no hepatosplenomegaly or masses, no abdominal bruits or hernia , no rebound or guarding.   Extremities: No lower extremity edema. No clubbing or deformities. Neuro: Alert and oriented x 4   Skin: Warm and dry, no jaundice.   Psych: Alert and cooperative, normal mood and affect.  Labs:  Lab Results  Component Value Date   CREATININE 0.95 12/27/2018   BUN 12 12/27/2018   NA 137 12/27/2018   K 3.9 12/27/2018   CL 103 12/27/2018   CO2 26 12/27/2018   Lab Results  Component Value Date   ALT 22 10/26/2018   AST 21 10/26/2018   ALKPHOS 48 10/26/2018   BILITOT 0.7 10/26/2018   Lab Results  Component Value Date   WBC 8.2 12/27/2018   HGB 14.6 12/27/2018   HCT 45.2 12/27/2018   MCV 93.4 12/27/2018   PLT 263 12/27/2018   Lab Results  Component Value Date   TSH 2.60 12/13/2018   Lab Results  Component Value Date   LIPASE 43 10/26/2018    Imaging Studies: Dg Chest 2 View  Result Date: 12/27/2018 CLINICAL DATA:  Onset mid and left chest pain last night. EXAM: CHEST - 2 VIEW COMPARISON:  PA and lateral chest 10/26/2018 and single-view of the chest 12/22/2014. FINDINGS: Lungs are clear. Heart size is normal. The patient is status post CABG. No pneumothorax or pleural fluid. No acute or focal bony abnormality. IMPRESSION: No acute disease. Electronically Signed   By: Inge Rise M.D.   On: 12/27/2018 14:14   Ct Angio Chest Aorta W And/or Wo Contrast  Addendum Date: 12/27/2018   ADDENDUM REPORT: 12/27/2018 19:38 ADDENDUM: This addendum is given for the purpose of noting that the aorta is well opacified with contrast and no dissection or aneurysm is present. Electronically Signed   By: Inge Rise M.D.   On: 12/27/2018 19:38   Result Date: 12/27/2018 CLINICAL DATA:  Onset chest and mandible pain at 9 p.m. last night. EXAM: CT ANGIOGRAPHY CHEST WITH CONTRAST TECHNIQUE: Multidetector CT imaging of the chest was performed using the standard protocol during  bolus administration of intravenous contrast. Multiplanar CT image reconstructions and MIPs were obtained to evaluate the vascular anatomy. CONTRAST:  100 mL OMNIPAQUE IOHEXOL 350 MG/ML SOLN COMPARISON:  CT chest 10/25/2015. PA and lateral chest earlier today. FINDINGS: Cardiovascular: Satisfactory opacification of the pulmonary arteries to the segmental level. No evidence of pulmonary embolism. Normal heart size. No pericardial effusion. The patient is status post CABG. Mediastinum/Nodes: No enlarged mediastinal, hilar, or axillary lymph nodes. Thyroid gland, trachea, and esophagus demonstrate no significant findings. Lungs/Pleura: No pleural effusion. Mild dependent atelectasis. Linear scar left upper lobe is unchanged. Upper Abdomen: The liver is somewhat low attenuating compatible with fatty infiltration. The patient is status post cholecystectomy. Musculoskeletal: No acute or focal abnormality. Thoracic spondylosis noted. Review of the MIP images confirms  the above findings. IMPRESSION: Negative for pulmonary embolus.  No acute disease. Fatty infiltration of liver. Electronically Signed: By: Inge Rise M.D. On: 12/27/2018 19:22

## 2019-01-14 NOTE — Assessment & Plan Note (Signed)
Symptoms well controlled.  Continue AcipHex 20 mg twice daily before meals.  Return to the office in 2 years.  Reinforced antireflux measures.

## 2019-01-14 NOTE — Assessment & Plan Note (Signed)
Surveillance colonoscopy due June 2021.

## 2019-01-14 NOTE — Assessment & Plan Note (Signed)
Vague, sporadic upper abdominal pain lasting for minutes at a time.  Occurs about once per month.  Unrelated to food or bowel function.  Etiology unclear.  Advised patient to monitor for increased frequency worsening symptoms.  Would consider CT if ongoing problems.

## 2019-01-14 NOTE — Patient Instructions (Addendum)
1. Continue Aciphex as before.  2. Monitor for more frequent abdominal pain, worsening abdominal pain. Let me know if you are having ongoing problems and we could consider CT scan of your abdomen.  3. You are due next colonoscopy in 09/2019.

## 2019-01-20 ENCOUNTER — Ambulatory Visit: Payer: BC Managed Care – PPO | Admitting: Neurology

## 2019-01-20 ENCOUNTER — Other Ambulatory Visit: Payer: Self-pay

## 2019-01-20 VITALS — BP 122/80 | HR 78 | Temp 97.1°F | Ht 66.0 in | Wt 222.5 lb

## 2019-01-20 DIAGNOSIS — M545 Low back pain, unspecified: Secondary | ICD-10-CM

## 2019-01-20 DIAGNOSIS — R252 Cramp and spasm: Secondary | ICD-10-CM

## 2019-01-20 MED ORDER — CLONAZEPAM 0.5 MG PO TABS: 0.5000 mg | ORAL_TABLET | Freq: Two times a day (BID) | ORAL | 3 refills | Status: DC | PRN

## 2019-01-20 MED ORDER — GABAPENTIN 100 MG PO CAPS: 300.0000 mg | ORAL_CAPSULE | Freq: Every day | ORAL | 11 refills | Status: DC

## 2019-01-20 NOTE — Progress Notes (Signed)
PATIENT: Michelle Patton DOB: December 16, 1955  Chief Complaint  Patient presents with  . New Patient (Initial Visit)    RM EMG 4, alone. Internal referral from Benny Lennert, MD for leg cramps. She has seen Dr. Krista Blue in the past for leg cramps and rx'd clonazepam. Her PCP was prescribing refills after but she got a new PCP and she does not want to prescribe and told to f/u with Dr. Krista Blue. Leg cramps still present and severity comes and goes. She was on 1mg  prn per pt.  [name] PCP    Lattie Haw Corum,MD     HISTORICAL  Michelle Patton is a 63 year old female, referred by her primary care physician Dr. Benny Lennert for evaluation of bilateral lower extremity muscle cramps. Initial evaluation was on Sept 28 2020.  I saw her initially in 2014.  She had a past medical history of hyperlipidemia, has been treated with different statin over the past 10 years, this including Lipitor, Crestor, Zocor, over the past 10 years, she never had a period of time without any statin treatment, she also had a history of open heart surgery in 1968, she had a super valvular  aortic stenosis, she had aortic valve repair, is not taking any chronic anticoagulation treatment, she also had a history of left femoral artery stenosis, she presented with vascular claudication of her left lower extremity, she underwent angioplasty, and stent placement in 2010 by Dr. Doren Custard, which has helped her left lower extremity symptoms,  She complains of more than 10 years history of bilateral lower extremity muscle achy pain, mainly involving bilateral feet, anterior shin muscle, there was no clear trigger event, most bothersome time is at night time, gradually worsened over the past 10 years, with more frequency, more prolonged episode, currently she was awaken 4-5 times each night because of muscle cramping, it was so painful, she has to get out of the bed, walking to the kitchen, drinking some water, juice is helpful sometimes, episode can last 10-45 minutes, she has  difficulty sleeping because of that  She is taking ambien, also Flexeril every night prn for symptomatic control  She complains of subjective bilateral lower extremity weakness, her legs felt heavy, but denies difficulty walking, she has chronic low back pain.  She denies family history of muscle disease, no bilateral upper extremity involvement, no sensory loss.  EMG nerve conduction study in June 2014 was normal, she was treated with clonazepam 0.5 mg every night, which has been helpful  Over the years, she continue have intermittent bilateral lower extremity cramping, involving bilateral lower extremity muscles, couple times each week, she has to walk, stretch, few minutes, despite drinking plenty water, she walks regularly, was able to hiking 10 miles with her son in 1 day, she does have low back pain, radiating pain to bilateral hip, denies gait abnormality  She has changed primary care physician, wants to continue clonazepam refill through our office.  She has never tried gabapentin treatment in the past.  REVIEW OF SYSTEMS: Full 14 system review of systems performed and notable only for as above All other review of systems were negative.  ALLERGIES: Allergies  Allergen Reactions  . Penicillins Hives    Has patient had a PCN reaction causing immediate rash, facial/tongue/throat swelling, SOB or lightheadedness with hypotension: Yes Has patient had a PCN reaction causing severe rash involving mucus membranes or skin necrosis: No Has patient had a PCN reaction that required hospitalization No Has patient had a PCN reaction occurring within  the last 10 years: No If all of the above answers are "NO", then may proceed with Cephalosporin use.  Have taken Keflex before without any adverse reaction.   . Sulfa Antibiotics Nausea And Vomiting    HOME MEDICATIONS: Current Outpatient Medications  Medication Sig Dispense Refill  . aspirin EC 81 MG tablet Take 81 mg by mouth daily.    .  Cholecalciferol (VITAMIN D3) 5000 UNITS TABS Take 5,000 Units by mouth every morning.     . diclofenac (VOLTAREN) 75 MG EC tablet TAKE 1 TABLET TWICE A DAY (Patient taking differently: Take 75 mg by mouth 2 (two) times daily. **DO NOT CRUSH**) 180 tablet 4  . estradiol (ESTRACE) 1 MG tablet Take 1 mg by mouth every other day.     . Flaxseed, Linseed, (FLAXSEED OIL) 1000 MG CAPS Take 1 capsule by mouth daily.    . magnesium gluconate (MAGONATE) 500 MG tablet Take 500 mg by mouth 2 (two) times daily. At bedtime for legcramps    . medroxyPROGESTERone (PROVERA) 2.5 MG tablet Take 2.5 mg by mouth every other day.     . metoprolol tartrate (LOPRESSOR) 25 MG tablet TAKE 1 TABLET TWICE A DAY (Patient taking differently: Take 25 mg by mouth 2 (two) times daily. ) 180 tablet 4  . RABEprazole (ACIPHEX) 20 MG tablet Take 20 mg by mouth 2 (two) times daily.     . rosuvastatin (CRESTOR) 10 MG tablet Take 1 tablet (10 mg total) by mouth daily. 90 tablet 1  . vitamin E 400 UNIT capsule Take 400 Units by mouth daily.    [name] zolpidem (AMBIEN) 5 MG tablet Take 1 tablet (5 mg total) by mouth at bedtime as needed for sleep. 30 tablet 1  . furosemide (LASIX) 40 MG tablet Take 1 tablet (40 mg total) by mouth daily as needed for edema. (Patient taking differently: Take 40 mg by mouth daily as needed. ) 90 tablet 3   No current facility-administered medications for this visit.     PAST MEDICAL HISTORY: Past Medical History:  Diagnosis Date  . Anxiety   . Aortic regurgitation 07/20/2014  . Aortic stenosis, severe 07/20/2014  . Arthritis   . DVT (deep venous thrombosis) ([place])   . Family history of adverse reaction to anesthesia    Reports father deliurm in his 19's with CABG  . GERD (gastroesophageal reflux disease)   . History of pneumonia   . Hyperlipidemia   . Hypertension   . Insomnia, unspecified   . Muscle weakness (generalized)   . Peripheral artery disease ([place])   . 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 Sleep apnea    diagnosed multiple years ago at [facility]  . Supravalvular aortic stenosis, congenital - s/p repair during childhood     PAST SURGICAL HISTORY: Past Surgical History:  Procedure Laterality Date  . ABDOMINAL AORTAGRAM  06/24/12  . ABDOMINAL AORTAGRAM N/A 06/24/2012   Procedure: ABDOMINAL Maxcine Ham;  Surgeon: Angelia Mould, MD;  Location: [facility] CATH LAB;  Service: Cardiovascular;  Laterality: N/A;  . AORTIC VALVE REPLACEMENT N/A 10/07/2014   Procedure: REDO AORTIC VALVE REPLACEMENT (AVR);  Surgeon: Rexene Alberts, MD;  Location: [place];  Service: Open Heart Surgery;  Laterality: N/A;  . ASCENDING AORTIC ROOT REPLACEMENT N/A 10/07/2014   Procedure: ASCENDING AORTIC ROOT REPLACEMENT;  Surgeon:  Rexene Alberts, MD;  Location: [place];  Service: Open Heart Surgery;  Laterality: N/A;  . BIOPSY  09/27/2016   Procedure: BIOPSY;  Surgeon: Daneil Dolin, MD;  [facility];  Service: Endoscopy;;  colon  . BREAST REDUCTION SURGERY Bilateral 01/21/2018   Procedure: BREAST REDUCTION WITH LIPOSUCTION;  Surgeon: Cristine Polio, MD;  Location: [place];  Service: Plastics;  Laterality: Bilateral;  . [place]  . CERVICAL FUSION    . CHOLECYSTECTOMY    . COLONOSCOPY WITH PROPOFOL N/A 09/27/2016   Dr. Gala Romney: Diverticulosis, several tubular adenomas removed ranging 4 to 7 mm in size, internal grade 1 hemorrhoids, terminal ileum normal, segmental biopsies negative for microscopic colitis.  Next colonoscopy June 2021  . ESOPHAGOGASTRODUODENOSCOPY (EGD) WITH PROPOFOL N/A 09/27/2016   Dr. Gala Romney: Small hiatal hernia, mild Schatzki ring status post disruption, LA grade a esophagitis  . ILIAC ARTERY STENT Left 12/2007  . KNEE ARTHROSCOPY WITH MEDIAL MENISECTOMY Right 01/10/2018    Procedure: RIGHT KNEE ARTHROSCOPY WITH PARTIAL MEDIAL MENISECTOMY;  Surgeon: Carole Civil, MD;  Location: [facility];  Service: Orthopedics;  Laterality: Right;  . LEFT AND RIGHT HEART CATHETERIZATION WITH CORONARY ANGIOGRAM N/A 07/31/2014   Procedure: LEFT AND RIGHT HEART CATHETERIZATION WITH CORONARY ANGIOGRAM;  Surgeon: Burnell Blanks, MD;  Location: [facility] CATH LAB;  Service: Cardiovascular;  Laterality: N/A;  . Venia Minks DILATION N/A 09/27/2016   Procedure: Venia Minks DILATION;  Surgeon: Daneil Dolin, MD;  [facility];  Service: Endoscopy;  Laterality: N/A;  . POLYPECTOMY  09/27/2016   Procedure: POLYPECTOMY;  Surgeon: Daneil Dolin, MD;  [facility];  Service: Endoscopy;;  colon  . ROTATOR CUFF REPAIR Bilateral   . TEE WITHOUT CARDIOVERSION N/A 07/07/2014   Procedure: TRANSESOPHAGEAL ECHOCARDIOGRAM (TEE);  Surgeon: Arnoldo Lenis, MD;  [facility];  Service: Cardiology;  Laterality: N/A;  . TEE WITHOUT CARDIOVERSION N/A 07/20/2014   Procedure: TRANSESOPHAGEAL ECHOCARDIOGRAM (TEE) WITH PROPOFOL;  Surgeon: Arnoldo Lenis, MD;  Location: [facility];  Service: Endoscopy;  Laterality: N/A;  . TEE WITHOUT CARDIOVERSION N/A 10/07/2014   Procedure: TRANSESOPHAGEAL ECHOCARDIOGRAM (TEE);  Surgeon: Rexene Alberts, MD;  Location: [place];  Service: Open Heart Surgery;  Laterality: N/A;    FAMILY HISTORY: Family History  Problem Relation Age of Onset  . Hypertension Mother   . Hypertension Father   . Heart disease Father        before age 53  . Other Father        varicose veins  . COPD Father   . Colon cancer Neg Hx   . Celiac disease Neg Hx   . Inflammatory bowel disease Neg Hx     SOCIAL HISTORY: Social History   Socioeconomic History  . Marital status: Married    Spouse name: Not on file  . Number of children: 0  . Years of education: some coll  . Highest education level: Not on file  Occupational History  . Occupation: call center     Employer: [employer]    Comment: [employer]  Social Needs  . Financial resource strain: Not on file  . Food insecurity    Worry: Not on file    Inability: Not on file  . Transportation needs    Medical: Not on file    Non-medical: Not on file  Tobacco Use  . Smoking status: Current Every Day Smoker    Packs/day: 0.50    Years:  40.00    Pack years: 20.00    Types: Cigarettes  . Smokeless tobacco: Never Used  . Tobacco comment: states she stopped 01-10-18  Substance and Sexual Activity  . Alcohol use: Yes  . Drug use: No  . Sexual activity: Yes    Birth control/protection: None  Lifestyle  . Physical activity    Days per week: Not on file    Minutes per session: Not on file  . Stress: Not on file  Relationships  . Social Herbalist on phone: Not on file    Gets together: Not on file    Attends religious service: Not on file    Active member of club or organization: Not on file    Attends meetings of clubs or organizations: Not on file    Relationship status: Not on file  . Intimate partner violence    Fear of current or ex partner: Not on file    Emotionally abused: Not on file    Physically abused: Not on file    Forced sexual activity: Not on file  Other Topics Concern  . Not on file  Social History Narrative   Patient is single and lives at home with her partner. Patient works at [employer] center. Some college education. Patient drinks coffee and tea.     PHYSICAL EXAM   Vitals:   01/20/19 0722  BP: 122/80  Pulse: 78  Temp: (!) 97.1 F (36.2 C)  SpO2: 98%  Weight: 222 lb 8 oz (100.9 kg)  Height: 5\' 6"  (1.676 m)    Not recorded      Body mass index is 35.91 kg/m.  PHYSICAL EXAMNIATION:  Gen: NAD, conversant, well nourised, well groomed                     Cardiovascular: Regular rate rhythm, no peripheral edema, warm, nontender. Eyes: Conjunctivae clear without exudates or hemorrhage Neck: Supple, no carotid bruits. Pulmonary: Clear to auscultation  bilaterally   NEUROLOGICAL EXAM:  MENTAL STATUS: Speech:    Speech is normal; fluent and spontaneous with normal comprehension.  Cognition:     Orientation to time, place and person     Normal recent and remote memory     Normal Attention span and concentration     Normal Language, naming, repeating,spontaneous speech     Fund of knowledge   CRANIAL NERVES: CN II: Visual fields are full to confrontation.  Pupils are round equal and briskly reactive to light. CN III, IV, VI: extraocular movement are normal. No ptosis. CN V: Facial sensation is intact to pinprick in all 3 divisions bilaterally. Corneal responses are intact.  CN VII: Face is symmetric with normal eye closure and smile. CN VIII: Hearing is normal to causal conversation. CN IX, X: Palate elevates symmetrically. Phonation is normal. CN XI: Head turning and shoulder shrug are intact CN XII: Tongue is midline with normal movements and no atrophy.  MOTOR: Muscle bulk and tone are normal. Muscle strength is normal.  REFLEXES: Reflexes are 1 and symmetric at the biceps, triceps, knees, and ankles. Plantar responses are flexor.  SENSORY: Length dependent decreased to light touch, pinprick and vibratory sensation to bilateral ankle level  COORDINATION: Rapid alternating movements and fine finger movements are intact. There is no dysmetria on finger-to-nose and heel-knee-shin.    GAIT/STANCE: Posture is normal. Gait is steady with normal steps, base, arm swing, and turning. Heel and toe walking are normal. Tandem gait is  normal.  Romberg is absent.   DIAGNOSTIC DATA (LABS, IMAGING, TESTING) - I reviewed patient records, labs, notes, testing and imaging myself where available.   ASSESSMENT AND PLAN  Michelle Patton is a 63 y.o. female   Chronic low back pain radiating pain to bilateral lower extremity Frequent bilateral lower extremity muscle cramping  MRI of lumbar spine to rule out lumbar radiculopathy  Gabapentin 100  mg 3 capsules at nighttime  Clonazepam 0.5 mg as needed   Marcial Pacas, M.D. Ph.D.  [facility] [address], [place], [place] [address] Ph: [phone] Fax: [phone]  CC: Referring Provider

## 2019-01-22 ENCOUNTER — Encounter: Payer: Self-pay | Admitting: Neurology

## 2019-01-23 ENCOUNTER — Other Ambulatory Visit: Payer: Self-pay

## 2019-01-23 ENCOUNTER — Encounter: Payer: Self-pay | Admitting: Cardiology

## 2019-01-23 ENCOUNTER — Telehealth: Payer: Self-pay | Admitting: Neurology

## 2019-01-23 ENCOUNTER — Ambulatory Visit (INDEPENDENT_AMBULATORY_CARE_PROVIDER_SITE_OTHER): Payer: BC Managed Care – PPO | Admitting: Cardiology

## 2019-01-23 VITALS — BP 149/87 | HR 74 | Temp 97.2°F | Ht 66.0 in | Wt 222.0 lb

## 2019-01-23 DIAGNOSIS — Z952 Presence of prosthetic heart valve: Secondary | ICD-10-CM | POA: Diagnosis not present

## 2019-01-23 DIAGNOSIS — I10 Essential (primary) hypertension: Secondary | ICD-10-CM

## 2019-01-23 DIAGNOSIS — R0789 Other chest pain: Secondary | ICD-10-CM

## 2019-01-23 DIAGNOSIS — I739 Peripheral vascular disease, unspecified: Secondary | ICD-10-CM

## 2019-01-23 DIAGNOSIS — E782 Mixed hyperlipidemia: Secondary | ICD-10-CM

## 2019-01-23 MED ORDER — LOSARTAN POTASSIUM 25 MG PO TABS: 25.0000 mg | ORAL_TABLET | Freq: Every day | ORAL | 0 refills | Status: DC

## 2019-01-23 MED ORDER — LOSARTAN POTASSIUM 25 MG PO TABS: 25.0000 mg | ORAL_TABLET | Freq: Every day | ORAL | 3 refills | Status: DC

## 2019-01-23 NOTE — Patient Instructions (Signed)
Medication Instructions:  START LOSARTAN 25 MG DAILY   Labwork: 2 WEEKS  BMET   Testing/Procedures: NONE  Follow-Up: Your physician wants you to follow-up in: 6 MONTHS.  You will receive a reminder letter in the mail two months in advance. If you don't receive a letter, please call our office to schedule the follow-up appointment.   Any Other Special Instructions Will Be Listed Below (If Applicable).     If you need a refill on your cardiac medications before your next appointment, please call your pharmacy.

## 2019-01-23 NOTE — Telephone Encounter (Signed)
BCBS Auth: [id number] (exp. 01/23/19 to 07/21/19) patient is scheduled at [facility] for 01/31/19 arrival time is 4:30 pm. Patient is aware of time and day.

## 2019-01-23 NOTE — Progress Notes (Signed)
Clinical Summary Ms. Senteno is a 63 y.o.female seen today for follow up of the following medicla problems  1. Aortic stenosis - hx in 1968 at age of 63 of supravavlular aortic stenosis, corrective surgery with patching at that time.  - recently found to have developed severe aortic valvular stenosis with possible recurrence of supravalvular stenosis as well based on imaging. 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  - Medtronic Freestyle stentless porcine valve/aortic root graft (size 21 mm, model # 995, serial # [id number])    09/2017 echo LVEF 65-70%, no WMAS, grade I diastolic dysfunction, normal AVR. - no recent SOB or DOE   2. PAD - followed by vascular, history of left external iliac stent.  -some recent recurrent leg pains, she is going to call her vascular provider she reports   3. HTN - she is compliant iwht meds -cough on ACE-I, discontinued - reported some insomina initialyl with losartan but unclear if truly related  - SBPs 130-170s from chart review  4. HL - leg pains on lipitor   11/2018 TC 282 HDL 39 TG 457 LDL not calculated.  - started on crestor 52m daily after recent panel - reports regular beer use, we discussed   5. Chest pain - sharp pain under left breast. Can occur at rest or with exertion.  - usually comes on with stress. +SOB, not positional . Constant up 1.5 hours.   - episode 12/27/18 of chest pain, seen in ER - EKG and enzymes benign. CT PE was negative.   - she reports high levels of stress work - episode last month stabbing like left, in neck and jaw. Awoke her from sleep. Does not recall duration. Not positional.  Fairly long in duration. Different from prior symptoms - no recurrent symptoms since 12/2018.    SH: Her husband Raschie LGlassburnis also a patient of mine.[place]     [place]. Enjoys riding motorcycles  Past Medical History:  Diagnosis Date  . Anxiety   . Aortic regurgitation 07/20/2014  . Aortic stenosis, severe 07/20/2014  . Arthritis   . D[place] ([place] venous [place]) ([place] [facility]   . Family history of adverse reaction to anesthesia    Reports father deliurm in his 721'swith CABG  . GERD (gastroesophageal reflux disease)   . History of pneumonia   . Hyperlipidemia   . Hypertension   . Insomnia, unspecified   . Muscle weakness (generalized)   . Peripheral artery disease ([place]   . 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 Sleep apnea    [facility] . Supravalvular aortic stenosis, congenital - s/p repair during childhood      Allergies  Allergen Reactions  . Penicillins Hives    Has patient had a PCN reaction causing immediate rash, facial/tongue/throat swelling, SOB or lightheadedness with hypotension: Yes Has patient had a PCN reaction causing severe rash involving mucus membranes or skin necrosis: No Has patient had a PCN reaction that required hospitalization No Has patient had a PCN reaction occurring within the last 10  years: No If all of the above answers are "NO", then may proceed with Cephalosporin use.  Have taken Keflex before without any adverse reaction.   . Sulfa Antibiotics Nausea And Vomiting     Current Outpatient Medications  Medication Sig Dispense Refill  . aspirin EC 81 MG tablet Take 81 mg by mouth daily.    . Cholecalciferol (VITAMIN D3) 5000 UNITS TABS Take 5,000 Units by mouth every morning.     . clonazePAM (KLONOPIN) 0.5 MG tablet Take 1 tablet (0.5 mg total) by mouth 2 (two) times  daily as needed for anxiety. 12 tablet 3  . diclofenac (VOLTAREN) 75 MG EC tablet TAKE 1 TABLET TWICE A DAY (Patient taking differently: Take 75 mg by mouth 2 (two) times daily. **DO NOT CRUSH**) 180 tablet 4  . estradiol (ESTRACE) 1 MG tablet Take 1 mg by mouth every other day.     . Flaxseed, Linseed, (FLAXSEED OIL) 1000 MG CAPS Take 1 capsule by mouth daily.    . furosemide (LASIX) 40 MG tablet Take 1 tablet (40 mg total) by mouth daily as needed for edema. (Patient taking differently: Take 40 mg by mouth daily as needed. ) 90 tablet 3  . gabapentin (NEURONTIN) 100 MG capsule Take 3 capsules (300 mg total) by mouth at bedtime. 90 capsule 11  . magnesium gluconate (MAGONATE) 500 MG tablet Take 500 mg by mouth 2 (two) times daily. At bedtime for legcramps    . medroxyPROGESTERone (PROVERA) 2.5 MG tablet Take 2.5 mg by mouth every other day.     . metoprolol tartrate (LOPRESSOR) 25 MG tablet TAKE 1 TABLET TWICE A DAY (Patient taking differently: Take 25 mg by mouth 2 (two) times daily. ) 180 tablet 4  . RABEprazole (ACIPHEX) 20 MG tablet Take 20 mg by mouth 2 (two) times daily.     . rosuvastatin (CRESTOR) 10 MG tablet Take 1 tablet (10 mg total) by mouth daily. 90 tablet 1  . vitamin E 400 UNIT capsule Take 400 Units by mouth daily.    [name] zolpidem (AMBIEN) 5 MG tablet Take 1 tablet (5 mg total) by mouth at bedtime as needed for sleep. 30 tablet 1   No current facility-administered medications for this visit.      Past Surgical History:  Procedure Laterality Date  . ABDOMINAL AORTAGRAM  06/24/12  . ABDOMINAL AORTAGRAM N/A 06/24/2012   Procedure: ABDOMINAL Maxcine Ham;  Surgeon: Angelia Mould, MD;  [facility] CATH LAB;  Service: Cardiovascular;  Laterality: N/A;  . AORTIC VALVE REPLACEMENT N/A 10/07/2014   Procedure: REDO AORTIC VALVE REPLACEMENT (AVR);  Surgeon: Rexene Alberts, MD;  Location: [place];  Service: Open Heart Surgery;  Laterality: N/A;  . ASCENDING AORTIC ROOT REPLACEMENT N/A  10/07/2014   Procedure: ASCENDING AORTIC ROOT REPLACEMENT;  Surgeon: Rexene Alberts, MD;  Location: [place];  Service: Open Heart Surgery;  Laterality: N/A;  . BIOPSY  09/27/2016   Procedure: BIOPSY;  Surgeon: Daneil Dolin, MD;  [facility];  Service: Endoscopy;;  colon  . BREAST REDUCTION SURGERY Bilateral 01/21/2018   Procedure: BREAST REDUCTION WITH LIPOSUCTION;  Surgeon: Cristine Polio, MD;  Location: [place];  Service: Plastics;  Laterality: Bilateral;  . [place]  . CERVICAL FUSION    . CHOLECYSTECTOMY    . COLONOSCOPY WITH PROPOFOL N/A 09/27/2016   Dr. Gala Romney: Diverticulosis, several tubular adenomas removed ranging 4 to 7 mm in size, internal grade 1 hemorrhoids, terminal ileum  normal, segmental biopsies negative for microscopic colitis.  Next colonoscopy June 2021  . ESOPHAGOGASTRODUODENOSCOPY (EGD) WITH PROPOFOL N/A 09/27/2016   Dr. Gala Romney: Small hiatal hernia, mild Schatzki ring status post disruption, LA grade a esophagitis  . ILIAC ARTERY STENT Left 12/2007  . KNEE ARTHROSCOPY WITH MEDIAL MENISECTOMY Right 01/10/2018   Procedure: RIGHT KNEE ARTHROSCOPY WITH PARTIAL MEDIAL MENISECTOMY;  Surgeon: Carole Civil, MD;  [facility];  Service: Orthopedics;  Laterality: Right;  . LEFT AND RIGHT HEART CATHETERIZATION WITH CORONARY ANGIOGRAM N/A 07/31/2014   Procedure: LEFT AND RIGHT HEART CATHETERIZATION WITH CORONARY ANGIOGRAM;  Surgeon: Burnell Blanks, MD;  [facility] C[facility] LAB;  Service: Cardiovascular;  Laterality: N/A;  . Venia Minks DILATION N/A 09/27/2016   Procedure: Venia Minks DILATION;  Surgeon: Daneil Dolin, MD;  [facility];  Service: Endoscopy;  Laterality: N/A;  . POLYPECTOMY  09/27/2016   Procedure: POLYPECTOMY;  Surgeon: Daneil Dolin, MD;  [facility];  Service: Endoscopy;;  colon  . ROTATOR CUFF REPAIR Bilateral   . TEE WITHOUT CARDIOVERSION N/A 07/07/2014   Procedure: TRANSESOPHAGEAL  ECHOCARDIOGRAM (TEE);  Surgeon: Arnoldo Lenis, MD;  [facility];  Service: Cardiology;  Laterality: N/A;  . TEE WITHOUT CARDIOVERSION N/A 07/20/2014   Procedure: TRANSESOPHAGEAL ECHOCARDIOGRAM (TEE) WITH PROPOFOL;  Surgeon: Arnoldo Lenis, MD;  [facility];  Service: Endoscopy;  Laterality: N/A;  . TEE WITHOUT CARDIOVERSION N/A 10/07/2014   Procedure: TRANSESOPHAGEAL ECHOCARDIOGRAM (TEE);  Surgeon: Rexene Alberts, MD;  Location: [place];  Service: Open Heart Surgery;  Laterality: N/A;     Allergies  Allergen Reactions  . Penicillins Hives    Has patient had a PCN reaction causing immediate rash, facial/tongue/throat swelling, SOB or lightheadedness with hypotension: Yes Has patient had a PCN reaction causing severe rash involving mucus membranes or skin necrosis: No Has patient had a PCN reaction that required hospitalization No Has patient had a PCN reaction occurring within the last 10 years: No If all of the above answers are "NO", then may proceed with Cephalosporin use.  Have taken Keflex before without any adverse reaction.   . Sulfa Antibiotics Nausea And Vomiting      Family History  Problem Relation Age of Onset  . Hypertension Mother   . Hypertension Father   . Heart disease Father        before age 33  . Other Father        varicose veins  . COPD Father   . Colon cancer Neg Hx   . Celiac disease Neg Hx   . Inflammatory bowel disease Neg Hx      Social History Ms. Roberti reports that she has been smoking cigarettes. She has a 20.00 pack-year smoking history. She has never used smokeless tobacco. Ms. Shockley reports current alcohol use.   Review of Systems CONSTITUTIONAL: No weight loss, fever, chills, weakness or fatigue.  HEENT: Eyes: No visual loss, blurred vision, double vision or yellow sclerae.No hearing loss, sneezing, congestion, runny nose or sore throat.  SKIN: No rash or itching.  CARDIOVASCULAR: per hpi RESPIRATORY: No shortness of  breath, cough or sputum.  GASTROINTESTINAL: No anorexia, nausea, vomiting or diarrhea. No abdominal pain or blood.  GENITOURINARY: No burning on urination, no polyuria NEUROLOGICAL: No headache, dizziness, syncope, paralysis, ataxia, numbness or tingling in the extremities. No change in bowel or bladder control.  MUSCULOSKELETAL: No muscle, back pain, joint pain or stiffness.  LYMPHATICS: No enlarged nodes. No history of  splenectomy.  PSYCHIATRIC: No history of depression or anxiety.  ENDOCRINOLOGIC: No reports of sweating, cold or heat intolerance. No polyuria or polydipsia.  [name]   Physical Examination Today's Vitals   01/23/19 1318  BP: (!) 149/87  Pulse: 74  Temp: (!) 97.2 F (36.2 C)  TempSrc: Temporal  SpO2: 98%  Weight: 222 lb (100.7 kg)  Height: _0  (1.676 m)   Body mass index is 35.83 kg/m.  Gen: resting comfortably, no acute distress HEENT: no scleral icterus, pupils equal round and reactive, no palptable cervical adenopathy,  CV: RRR, 2/6 systolic murmur rusb, no jvd Resp: Clear to auscultation bilaterally GI: abdomen is soft, non-tender, non-distended, normal bowel sounds, no hepatosplenomegaly MSK: extremities are warm, no edema.  Skin: warm, no rash Neuro:  no focal deficits Psych: appropriate affect   Diagnostic Studies 11/2015 echo Study Conclusions  - Left ventricle: The cavity size was normal. Wall thickness was normal. Systolic function was vigorous. The estimated ejection fraction was in the range of 65% to 70%. Wall motion was normal; there were no regional wall motion abnormalities. Doppler parameters are consistent with abnormal left ventricular relaxation (grade 1 diastolic dysfunction). - Aortic valve: 21 mm Medtronic Freestyle stentless porcine valve/aortic root graft in place. There was no significant regurgitation. Peak gradient (S): 19 mm Hg. Valve area (Vmax): 1.83 cm^2. - Mitral valve: Calcified annulus. There was  trivial regurgitation. - Right ventricle: The cavity size was mildly dilated. - Right atrium: Central venous pressure (est): 3 mm Hg. - Atrial septum: The septum bowed from right to left, consistent with increased right atrial pressure. - Tricuspid valve: There was mild regurgitation. - Pulmonic valve: Peak gradient (S): 19 mm Hg. - Pulmonary arteries: PA peak pressure: 21 mm Hg (S). - Pericardium, extracardiac: There was no pericardial effusion.  Impressions:  - Normal LV wall thickness with LVEF 65-70%. Grade 1 diastolic dysfunction. MAC with trivial mitral regurgitation. Bioprosthesis in aortic position as described above with no significant perivalvular regurgitation and peak gradient 19 mmHg. Mild RV enlargement. Mild tricuspid regurgitation with PASP estimated 21 mmHg.  07/2014 cath Hemodynamic Findings: Ao: 107/55  LV: 190/12/15 RA: 1 RV: 41/2/6 PA: 26/6 (mean 15)  PCWP: 7 Fick Cardiac Output: 4.76 L/min Fick Cardiac Index: 2.38 L/min/m2 Central Aortic Saturation: 96% Pulmonary Artery Saturation: 66%  Aortic valve data: Peak to peak gradient 83 mm Hg Mean gradient 56 mmHg AVA 0.52 cm2  Angiographic Findings:  Left main: Normal caliber vessel with no obstructive disease.   Left Anterior Descending Artery: Large caliber vessel that courses to the apex. Moderate caliber diagonal Jaelani Posa. No obstructive disease.   Circumflex Artery: Large caliber vessel with moderate caliber obtuse marginal Enoch Moffa. No obstructive disease.   Right Coronary Artery: Large dominant vessel with no obstructive disease.   Left Ventricular Angiogram: LVEF=60%  Aortic root angiogram:The aortic root is not enlarged. There is supravalvular density.   Aortic valve calcification noted on plain fluoroscopy.   Impression: 1. No angiographic evidence of CAD 2. Normal LV systolic function 3. Severe gradient across the aortic valve into  the aorta with density noted in the aortic root. Cannot exclude recurrent supravalvular stenosis along with aortic valve stenosis. 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  4. Normal filling pressures   09/2017 echo Study Conclusions  - Left ventricle: The cavity size was normal. Wall thickness was normal. Systolic function was vigorous. The estimated ejection fraction was in the range of 65% to 70%. Wall motion  was normal; there were no regional wall motion abnormalities. Doppler parameters are consistent with abnormal left ventricular relaxation (grade 1 diastolic dysfunction). Doppler parameters are consistent with indeterminate ventricular filling pressure. - Aortic valve: 21 mm Medtronic Freestyle stentless porcine valve/aortic root graft in place. There was no significant regurgitation. There was no stenosis. Peak gradient (S): 11 mm Hg. - Mitral valve: Mildly calcified annulus. Normal thickness leaflets . - Tricuspid valve: There was mild regurgitation. - Pulmonary arteries: PA peak pressure: 35 mm Hg (S).    Assessment and Plan   1. Aortic stenosis - s/p tissue AVR and aortic root graft placement - 2019 echo shows normal LVEF, normal AVR, - no recent symptoms, continue to monitor.    2. HTN -above goal - retry losartan 54m daily, its unclear if it was the real cause of some insomnia a few years ago, she has an ACEI allergy. With her PAD an ACE/ARB would be best option if able to tolerate given additional cardiovascular benefits   3. Hyperlipidemia - continue statin, we discused dieatery changes including cutting back on her beer intake to help TGs  4. PAD - continue medical therapy - she reports some recent leg pains she plans to call and discuss with vascular  5. Chest pain - no recurrence since ER visit in 12/2018. Negative workup at that time, symptoms atypical for cardicat chest pain -  continue to monitor. If significnat recurrence consider ischemic testing at that time. Cath 2016 no significant disease     JArnoldo Lenis M.D.,

## 2019-01-27 ENCOUNTER — Telehealth: Payer: Self-pay | Admitting: Neurology

## 2019-01-27 NOTE — Telephone Encounter (Signed)
A prescription was just sent to the pharmacy for clonazepam 0.5mg  on 01/20/2019 with three additional refills.  She does not needs further refills at this time.

## 2019-01-27 NOTE — Telephone Encounter (Signed)
Please check her controlled substance registry, give her clonazepam 0.5mg  qhs 30 days.

## 2019-01-27 NOTE — Telephone Encounter (Signed)
Pt called in and stated that the gabapentin (NEURONTIN) 100 MG capsule isnt working she is having really bad leg cramps

## 2019-01-27 NOTE — Telephone Encounter (Signed)
I spoke to the patient who informed me that she only has issues with leg cramps that keep her up a few times per month.  States she tried gabapentin 100mg , three tablets at bedtime and she felt it made her cramps much worse.  She is no longer going to use this medication.  She is taking clonazepam 0.5mg , one tablet prn.  States she only uses the medication if she is unable to walk-out the cramps.  She understands that Dr. Krista Blue sent her in refill for this medication but limited use to #12 per month.

## 2019-01-31 ENCOUNTER — Ambulatory Visit (HOSPITAL_COMMUNITY): Admission: RE | Admit: 2019-01-31 | Payer: BC Managed Care – PPO | Source: Ambulatory Visit

## 2019-02-07 ENCOUNTER — Other Ambulatory Visit: Payer: Self-pay

## 2019-02-07 ENCOUNTER — Ambulatory Visit (HOSPITAL_COMMUNITY)
Admission: RE | Admit: 2019-02-07 | Discharge: 2019-02-07 | Disposition: A | Discharge status: Home / Self Care | Payer: BC Managed Care – PPO | Source: Ambulatory Visit | Attending: Neurology | Admitting: Neurology

## 2019-02-07 DIAGNOSIS — M545 Low back pain, unspecified: Secondary | ICD-10-CM

## 2019-02-10 ENCOUNTER — Telehealth: Payer: Self-pay | Admitting: Neurology

## 2019-02-10 NOTE — Telephone Encounter (Signed)
Please call patient, there is evidence of multiple level degenerative changes, left L3-4 forminal/extraforaminal disc protrusion and potentially irritating the exiting left L3 nerve root.   IMPRESSION: 1. Shallow left foraminal/extraforaminal disc protrusion at L3-4, closely approximating and potentially irritating the exiting left L3 nerve root. 2. Left eccentric disc bulge with facet hypertrophy at L4-5 with resultant mild to moderate spinal stenosis, with moderate left L4 foraminal narrowing. 3. Additional non-compressive disc bulging at L1-2 and L5-S1 without significant stenosis or neural impingement. 4. Moderate bilateral facet hypertrophy at L2-3 through L5-S1, which could contribute to lower back pain. 5. Fibroid uterus.

## 2019-02-10 NOTE — Telephone Encounter (Signed)
I have spoken with the patient and she verbalized understanding of the results below.  She would like to further review with Dr. Krista Blue.  She has scheduled a follow up in early November.

## 2019-02-15 ENCOUNTER — Other Ambulatory Visit: Payer: Self-pay | Admitting: Cardiology

## 2019-02-26 ENCOUNTER — Ambulatory Visit: Payer: BC Managed Care – PPO | Admitting: Neurology

## 2019-02-26 ENCOUNTER — Encounter: Payer: Self-pay | Admitting: Neurology

## 2019-02-26 ENCOUNTER — Other Ambulatory Visit: Payer: Self-pay

## 2019-02-26 VITALS — BP 140/84 | HR 83 | Temp 97.6°F | Ht 66.0 in | Wt 224.5 lb

## 2019-02-26 DIAGNOSIS — M545 Low back pain, unspecified: Secondary | ICD-10-CM

## 2019-02-26 DIAGNOSIS — R252 Cramp and spasm: Secondary | ICD-10-CM

## 2019-02-26 MED ORDER — GABAPENTIN 300 MG PO CAPS: 300.0000 mg | ORAL_CAPSULE | Freq: Every day | ORAL | 4 refills | Status: DC

## 2019-02-26 NOTE — Progress Notes (Signed)
PATIENT: Michelle Patton DOB: August 10, 1955  Chief Complaint  Patient presents with  . LBP/Leg Pain    She would like to review her MRI lumbar.  She has restarted gabapentin 100mg , 3 capsules at bedtime. She has not received the clonazepam yet.     HISTORICAL  Michelle Patton is a 63 year old female, referred by her primary care physician Dr. Benny Lennert for evaluation of bilateral lower extremity muscle cramps. Initial evaluation was on Sept 28 2020.  I saw her initially in 2014.  She had a past medical history of hyperlipidemia, has been treated with different statin over the past 10 years, this including Lipitor, Crestor, Zocor, over the past 10 years, she never had a period of time without any statin treatment, she also had a history of open heart surgery in 1968, she had a super valvular  aortic stenosis, she had aortic valve repair, is not taking any chronic anticoagulation treatment, she also had a history of left femoral artery stenosis, she presented with vascular claudication of her left lower extremity, she underwent angioplasty, and stent placement in 2010 by Dr. Doren Custard, which has helped her left lower extremity symptoms,  She complains of more than 10 years history of bilateral lower extremity muscle achy pain, mainly involving bilateral feet, anterior shin muscle, there was no clear trigger event, most bothersome time is at night time, gradually worsened over the past 10 years, with more frequency, more prolonged episode, currently she was awaken 4-5 times each night because of muscle cramping, it was so painful, she has to get out of the bed, walking to the kitchen, drinking some water, juice is helpful sometimes, episode can last 10-45 minutes, she has difficulty sleeping because of that  She is taking ambien, also Flexeril every night prn for symptomatic control  She complains of subjective bilateral lower extremity weakness, her legs felt heavy, but denies difficulty walking, she has  chronic low back pain.  She denies family history of muscle disease, no bilateral upper extremity involvement, no sensory loss.  EMG nerve conduction study in June 2014 was normal, she was treated with clonazepam 0.5 mg every night, which has been helpful  Over the years, she continue have intermittent bilateral lower extremity cramping, involving bilateral lower extremity muscles, couple times each week, she has to walk, stretch, few minutes, despite drinking plenty water, she walks regularly, was able to hiking 10 miles with her son in 1 day, she does have low back pain, radiating pain to bilateral hip, denies gait abnormality  She has changed primary care physician, wants to continue clonazepam refill through our office.  She has never tried gabapentin treatment in the past.  UPDATE Nov 4th 2020: She complains of frequent bilateral calf muscle cramping, still have to get up when she has a spells, gabapentin 300 mg every night seems to help her sleep better, but she is not sure which truly help her muscle cramping, she has not get her clonazepam prescription  She complains of left leg, calf muscle tightness after prolonged walking, had a history of left femoral artery stenosis presented vascular claudication in the past, angioplasty and stent in 2010 was helpful, she is taking baby aspirin daily  Personally reviewed MRI lumbar October 2020, multilevel mild degenerative changes, most severe at L3-4, left foraminal, extraforaminal disc protrusion, potentially irritating the L3 nerve roots, left eccentric disc bulging with facet hypertrophy at L4-5, with moderate spinal stenosis, moderate bilateral facet hypertrophy at L2-3,  Laboratory evaluations in September 2020  showed negative troponin, normal CBC, BMP, lipid panel elevated triglyceride 457, normal TSH  REVIEW OF SYSTEMS: Full 14 system review of systems performed and notable only for as above All other review of systems were negative.   ALLERGIES: Allergies  Allergen Reactions  . Gabapentin Other (See Comments)    Reported that the med makes her leg cramps worse.  [name] Penicillins Hives    Has patient had a PCN reaction causing immediate rash, facial/tongue/throat swelling, SOB or lightheadedness with hypotension: Yes Has patient had a PCN reaction causing severe rash involving mucus membranes or skin necrosis: No Has patient had a PCN reaction that required hospitalization No Has patient had a PCN reaction occurring within the last 10 years: No If all of the above answers are "NO", then may proceed with Cephalosporin use.  Have taken Keflex before without any adverse reaction.   . Sulfa Antibiotics Nausea And Vomiting    HOME MEDICATIONS: Current Outpatient Medications  Medication Sig Dispense Refill  . aspirin EC 81 MG tablet Take 81 mg by mouth daily.    . Cholecalciferol (VITAMIN D3) 5000 UNITS TABS Take 5,000 Units by mouth every morning.     . clonazePAM (KLONOPIN) 0.5 MG tablet Take 1 tablet (0.5 mg total) by mouth 2 (two) times daily as needed for anxiety. 12 tablet 3  . diclofenac (VOLTAREN) 75 MG EC tablet TAKE 1 TABLET TWICE A DAY (Patient taking differently: Take 75 mg by mouth 2 (two) times daily. **DO NOT CRUSH**) 180 tablet 4  . estradiol (ESTRACE) 1 MG tablet Take 1 mg by mouth every other day.     . Flaxseed, Linseed, (FLAXSEED OIL) 1000 MG CAPS Take 1 capsule by mouth daily.    [name] gabapentin (NEURONTIN) 100 MG capsule Take 300 mg by mouth at bedtime.    [name] losartan (COZAAR) 25 MG tablet TAKE 1 TABLET BY MOUTH EVERY DAY 30 tablet 0  . magnesium gluconate (MAGONATE) 500 MG tablet Take 500 mg by mouth 2 (two) times daily. At bedtime for legcramps    . medroxyPROGESTERone (PROVERA) 2.5 MG tablet Take 2.5 mg by mouth every other day.     . metoprolol tartrate (LOPRESSOR) 25 MG tablet TAKE 1 TABLET TWICE A DAY (Patient taking differently: Take 25 mg by mouth 2 (two) times daily. ) 180 tablet 4  . RABEprazole  (ACIPHEX) 20 MG tablet Take 20 mg by mouth 2 (two) times daily.     . rosuvastatin (CRESTOR) 10 MG tablet Take 1 tablet (10 mg total) by mouth daily. 90 tablet 1  . vitamin E 400 UNIT capsule Take 400 Units by mouth daily.    [name] zolpidem (AMBIEN) 5 MG tablet Take 1 tablet (5 mg total) by mouth at bedtime as needed for sleep. 30 tablet 1  . furosemide (LASIX) 40 MG tablet Take 1 tablet (40 mg total) by mouth daily as needed for edema. (Patient taking differently: Take 40 mg by mouth daily as needed. ) 90 tablet 3   No current facility-administered medications for this visit.     PAST MEDICAL HISTORY: Past Medical History:  Diagnosis Date  . Anxiety   . Aortic regurgitation 07/20/2014  . Aortic stenosis, severe 07/20/2014  . Arthritis   . DVT (deep venous thrombosis) ([place])   . Family history of adverse reaction to anesthesia    Reports father deliurm in his 43's with CABG  . GERD (gastroesophageal reflux disease)   . History of pneumonia   . Hyperlipidemia   .  Hypertension   . Insomnia, unspecified   . Muscle weakness (generalized)   . Peripheral artery disease ([place])   . 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 Sleep apnea    diagnosed multiple years [facility] at [facility]  . Supravalvular aortic stenosis, congenital - s/p repair during childhood     PAST SURGICAL HISTORY: Past Surgical History:  Procedure Laterality Date  . ABDOMINAL AORTAGRAM  06/24/12  . ABDOMINAL AORTAGRAM N/A 06/24/2012   Procedure: ABDOMINAL Maxcine Ham;  Surgeon: Angelia Mould, MD;  Location: [facility] CATH LAB;  Service: Cardiovascular;  Laterality: N/A;  . AORTIC VALVE REPLACEMENT N/A 10/07/2014   Procedure: REDO AORTIC VALVE REPLACEMENT (AVR);  Surgeon: Rexene Alberts, MD;  Location: [place];  Service: Open Heart Surgery;  Laterality: N/A;  .  ASCENDING AORTIC ROOT REPLACEMENT N/A 10/07/2014   Procedure: ASCENDING AORTIC ROOT REPLACEMENT;  Surgeon: Rexene Alberts, MD;  Location: [place];  Service: Open Heart Surgery;  Laterality: N/A;  . BIOPSY  09/27/2016   Procedure: BIOPSY;  Surgeon: Daneil Dolin, MD;  [facility];  Service: Endoscopy;;  colon  . BREAST REDUCTION SURGERY Bilateral 01/21/2018   Procedure: BREAST REDUCTION WITH LIPOSUCTION;  Surgeon: Cristine Polio, MD;  Location: [place];  Service: Plastics;  Laterality: Bilateral;  . [place]  . CERVICAL FUSION    . CHOLECYSTECTOMY    . COLONOSCOPY WITH PROPOFOL N/A 09/27/2016   Dr. Gala Romney: Diverticulosis, several tubular adenomas removed ranging 4 to 7 mm in size, internal grade 1 hemorrhoids, terminal ileum normal, segmental biopsies negative for microscopic colitis.  Next colonoscopy June 2021  . ESOPHAGOGASTRODUODENOSCOPY (EGD) WITH PROPOFOL N/A 09/27/2016   Dr. Gala Romney: Small hiatal hernia, mild Schatzki ring status post disruption, LA grade a esophagitis  . ILIAC ARTERY STENT Left 12/2007  . KNEE ARTHROSCOPY WITH MEDIAL MENISECTOMY Right 01/10/2018   Procedure: RIGHT KNEE ARTHROSCOPY WITH PARTIAL MEDIAL MENISECTOMY;  Surgeon: Carole Civil, MD;  Location: [facility];  Service: Orthopedics;  Laterality: Right;  . LEFT AND RIGHT HEART CATHETERIZATION WITH CORONARY ANGIOGRAM N/A 07/31/2014   Procedure: LEFT AND RIGHT HEART CATHETERIZATION WITH CORONARY ANGIOGRAM;  Surgeon: Burnell Blanks, MD;  Location: [facility] CATH LAB;  Service: Cardiovascular;  Laterality: N/A;  . Venia Minks DILATION N/A 09/27/2016   Procedure: Venia Minks DILATION;  Surgeon: Daneil Dolin, MD;  [facility];  Service: Endoscopy;  Laterality: N/A;  . POLYPECTOMY  09/27/2016   Procedure: POLYPECTOMY;  Surgeon: Daneil Dolin, MD;  [facility];  Service: Endoscopy;;  colon  . ROTATOR CUFF REPAIR Bilateral   . TEE WITHOUT CARDIOVERSION N/A 07/07/2014    Procedure: TRANSESOPHAGEAL ECHOCARDIOGRAM (TEE);  Surgeon: Arnoldo Lenis, MD;  [facility];  Service: Cardiology;  Laterality: N/A;  . TEE WITHOUT CARDIOVERSION N/A 07/20/2014   Procedure: TRANSESOPHAGEAL ECHOCARDIOGRAM (TEE) WITH PROPOFOL;  Surgeon: Arnoldo Lenis, MD;  Location[facility];  Service: Endoscopy;  Laterality: N/A;  . TEE WITHOUT CARDIOVERSION N/A 10/07/2014   Procedure: TRANSESOPHAGEAL ECHOCARDIOGRAM (TEE);  Surgeon: Rexene Alberts, MD;  Location: [place];  Service: Open Heart Surgery;  Laterality: N/A;    FAMILY HISTORY: Family History  Problem Relation Age of Onset  . Hypertension Mother   . Hypertension Father   . Heart disease Father  before age 41  . Other Father        varicose veins  . COPD Father   . Colon cancer Neg Hx   . Celiac disease Neg Hx   . Inflammatory bowel disease Neg Hx     SOCIAL HISTORY: Social History   Socioeconomic History  . Marital status: Married    Spouse name: Not on file  . Number of children: 0  . Years of education: some coll  . Highest education level: Not on file  Occupational History  . Occupation: call center    Employer: [employer]    Comment: [employer]  Social Needs  . Financial resource strain: Not on file  . Food insecurity    Worry: Not on file    Inability: Not on file  . Transportation needs    Medical: Not on file    Non-medical: Not on file  Tobacco Use  . Smoking status: Current Every Day Smoker    Packs/day: 0.50    Years: 40.00    Pack years: 20.00    Types: Cigarettes  . Smokeless tobacco: Never Used  . Tobacco comment: states she stopped 01-10-18  Substance and Sexual Activity  . Alcohol use: Yes  . Drug use: No  . Sexual activity: Yes    Birth control/protection: None  Lifestyle  . Physical activity    Days per week: Not on file    Minutes per session: Not on file  . Stress: Not on file  Relationships  . Social Herbalist on phone: Not on file    Gets together: Not  on file    Attends religious service: Not on file    Active member of club or organization: Not on file    Attends meetings of clubs or organizations: Not on file    Relationship status: Not on file  . Intimate partner violence    Fear of current or ex partner: Not on file    Emotionally abused: Not on file    Physically abused: Not on file    Forced sexual activity: Not on file  Other Topics Concern  . Not on file  Social History Narrative   Patient is single and lives at home with her partner. Patient works at [employer] center. Some college education. Patient drinks coffee and tea.     PHYSICAL EXAM   Vitals:   02/26/19 1538  Temp: 97.6 F (36.4 C)  Weight: 224 lb 8 oz (101.8 kg)  Height: 5\' 6"  (1.676 m)    Not recorded      Body mass index is 36.24 kg/m.  PHYSICAL EXAMNIATION:  Gen: NAD, conversant, well nourised, well groomed                     Cardiovascular: Regular rate rhythm, no peripheral edema, warm, nontender. Eyes: Conjunctivae clear without exudates or hemorrhage Neck: Supple, no carotid bruits. Pulmonary: Clear to auscultation bilaterally   NEUROLOGICAL EXAM:  MENTAL STATUS: Speech:    Speech is normal; fluent and spontaneous with normal comprehension.  Cognition:     Orientation to time, place and person     Normal recent and remote memory     Normal Attention span and concentration     Normal Language, naming, repeating,spontaneous speech     Fund of knowledge   CRANIAL NERVES: CN II: Visual fields are full to confrontation.  Pupils are round equal and briskly reactive to light. CN III, IV, VI: extraocular  movement are normal. No ptosis. CN V: Facial sensation is intact to pinprick in all 3 divisions bilaterally. Corneal responses are intact.  CN VII: Face is symmetric with normal eye closure and smile. CN VIII: Hearing is normal to causal conversation. CN IX, X: Palate elevates symmetrically. Phonation is normal. CN XI: Head turning and  shoulder shrug are intact CN XII: Tongue is midline with normal movements and no atrophy.  MOTOR: Muscle bulk and tone are normal. Muscle strength is normal.  REFLEXES: Reflexes are 1 and symmetric at the biceps, triceps, knees, and ankles. Plantar responses are flexor.  SENSORY: Length dependent decreased to light touch, pinprick and vibratory sensation to bilateral ankle level  COORDINATION: Rapid alternating movements and fine finger movements are intact. There is no dysmetria on finger-to-nose and heel-knee-shin.    GAIT/STANCE: Posture is normal. Gait is steady with normal steps, base, arm swing, and turning. Heel and toe walking are normal. Tandem gait is normal.  Romberg is absent.   DIAGNOSTIC DATA (LABS, IMAGING, TESTING) - I reviewed patient records, labs, notes, testing and imaging myself where available.   ASSESSMENT AND PLAN  Simone Hindi is a 63 y.o. female   Chronic low back pain radiating pain to bilateral lower extremity Frequent bilateral lower extremity muscle cramping  Gabapentin 300 mg every night  EMG nerve conduction study to rule out left lumbar radiculopathy  Clonazepam 0.5 mg as needed for nighttime leg muscle cramping,  Follow-up with her vascular surgeon for history of left leg vascular claudication, left femoral artery vascular disease   Marcial Pacas, M.D. Ph.D.  [facility] [address] [place], [place] [address] Ph: [phone] Fax: [phone]  CC: Referring Provider

## 2019-03-05 ENCOUNTER — Other Ambulatory Visit: Payer: Self-pay

## 2019-03-05 ENCOUNTER — Ambulatory Visit
Admission: EM | Admit: 2019-03-05 | Discharge: 2019-03-05 | Disposition: A | Discharge status: Home / Self Care | Payer: BC Managed Care – PPO | Attending: Emergency Medicine | Admitting: Emergency Medicine

## 2019-03-05 DIAGNOSIS — L02414 Cutaneous abscess of left upper limb: Secondary | ICD-10-CM | POA: Diagnosis not present

## 2019-03-05 MED ORDER — DOXYCYCLINE HYCLATE 100 MG PO CAPS: 100.0000 mg | ORAL_CAPSULE | Freq: Two times a day (BID) | ORAL | 0 refills | Status: DC

## 2019-03-05 NOTE — ED Provider Notes (Signed)
[place]   [id number] 03/05/19 Arrival Time: [id number]   CC: ABSCESS  SUBJECTIVE:  Michelle Patton is a 63 y.o. female who presents with a possible abscess of her left forearm x 1 week. Denies precipitating event or trauma.  Has been trying warm compresses and tried popping without relief.  Worse to the touch.  Denies similar symptoms in the past.  Complains of associated swelling and redness.  Denies fever, chills, nausea, vomiting, drainage.    ROS: As per HPI.  All other pertinent ROS negative.     Past Medical History:  Diagnosis Date  . Anxiety   . Aortic regurgitation 07/20/2014  . Aortic stenosis, severe 07/20/2014  . Arthritis   . DVT (deep venous thrombosis) ([place])   . Family history of adverse reaction to anesthesia    Reports father deliurm in his 79's with CABG  . GERD (gastroesophageal reflux disease)   . History of pneumonia   . Hyperlipidemia   . Hypertension   . Insomnia, unspecified   . Muscle weakness (generalized)   . Peripheral artery disease ([place])   . 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 Sleep apnea    diagnosed multiple years ago at [facility]  . Supravalvular aortic stenosis, congenital - s/p repair during childhood    Past Surgical History:  Procedure Laterality Date  . ABDOMINAL AORTAGRAM  06/24/12  . ABDOMINAL AORTAGRAM N/A 06/24/2012   Procedure: ABDOMINAL Maxcine Ham;  Surgeon: Angelia Mould, MD;  Location: [facility] CATH LAB;  Service: Cardiovascular;  Laterality: N/A;  . AORTIC VALVE REPLACEMENT N/A 10/07/2014   Procedure: REDO AORTIC VALVE REPLACEMENT (AVR);  Surgeon: Rexene Alberts, MD;  Location: [place];  Service: Open Heart Surgery;  Laterality: N/A;  . ASCENDING AORTIC ROOT REPLACEMENT N/A 10/07/2014   Procedure: ASCENDING AORTIC ROOT REPLACEMENT;  Surgeon: Rexene Alberts, MD;  Location: [place];  Service: Open Heart Surgery;  Laterality: N/A;  . BIOPSY  09/27/2016   Procedure: BIOPSY;  Surgeon: Daneil Dolin, MD;  [facility];  Service: Endoscopy;;  colon  . BREAST REDUCTION SURGERY Bilateral 01/21/2018   Procedure: BREAST REDUCTION WITH LIPOSUCTION;  Surgeon: Cristine Polio, MD;  Location: [place];  Service: Plastics;  Laterality: Bilateral;  . [place]  . CERVICAL FUSION    . CHOLECYSTECTOMY    . COLONOSCOPY WITH PROPOFOL N/A 09/27/2016   Dr. Gala Romney: Diverticulosis, several tubular adenomas removed ranging 4 to 7 mm in size, internal grade 1 hemorrhoids, terminal ileum normal, segmental biopsies negative for microscopic colitis.  Next colonoscopy June 2021  . ESOPHAGOGASTRODUODENOSCOPY (EGD) WITH PROPOFOL N/A 09/27/2016   Dr. Gala Romney: Small hiatal hernia, mild Schatzki ring status post disruption, LA grade a esophagitis  . ILIAC ARTERY STENT Left 12/2007  . KNEE ARTHROSCOPY WITH MEDIAL MENISECTOMY Right 01/10/2018   Procedure: RIGHT KNEE ARTHROSCOPY WITH PARTIAL MEDIAL MENISECTOMY;  Surgeon: Carole Civil, MD;  [facility];  Service: Orthopedics;  Laterality: Right;  . LEFT AND RIGHT HEART CATHETERIZATION WITH CORONARY ANGIOGRAM N/A 07/31/2014   Procedure: LEFT AND RIGHT HEART CATHETERIZATION WITH CORONARY ANGIOGRAM;  Surgeon: Burnell Blanks, MD;  Location: [facility] CATH LAB;  Service: Cardiovascular;  Laterality: N/A;  . Venia Minks DILATION N/A 09/27/2016   Procedure: Venia Minks DILATION;  Surgeon: Daneil Dolin, MD;  [facility];  Service: Endoscopy;  Laterality: N/A;  . POLYPECTOMY  09/27/2016   Procedure: POLYPECTOMY;  Surgeon: Daneil Dolin, MD;  [facility];  Service: Endoscopy;;  colon  . ROTATOR CUFF REPAIR Bilateral   . TEE WITHOUT CARDIOVERSION N/A 07/07/2014   Procedure: TRANSESOPHAGEAL ECHOCARDIOGRAM (TEE);  Surgeon: Arnoldo Lenis, MD;  [facility];   Service: Cardiology;  Laterality: N/A;  . TEE WITHOUT CARDIOVERSION N/A 07/20/2014   Procedure: TRANSESOPHAGEAL ECHOCARDIOGRAM (TEE) WITH PROPOFOL;  Surgeon: Arnoldo Lenis, MD;  [facility];  Service: Endoscopy;  Laterality: N/A;  . TEE WITHOUT CARDIOVERSION N/A 10/07/2014   Procedure: TRANSESOPHAGEAL ECHOCARDIOGRAM (TEE);  Surgeon: Rexene Alberts, MD;  Location: [place];  Service: Open Heart Surgery;  Laterality: N/A;   Allergies  Allergen Reactions  . Penicillins Hives    Has patient had a PCN reaction causing immediate rash, facial/tongue/throat swelling, SOB or lightheadedness with hypotension: Yes Has patient had a PCN reaction causing severe rash involving mucus membranes or skin necrosis: No Has patient had a PCN reaction that required hospitalization No Has patient had a PCN reaction occurring within the last 10 years: No If all of the above answers are "NO", then may proceed with Cephalosporin use.  Have taken Keflex before without any adverse reaction.   . Sulfa Antibiotics Nausea And Vomiting   No current facility-administered medications on file prior to encounter.    Current Outpatient Medications on File Prior to Encounter  Medication Sig Dispense Refill  . aspirin EC 81 MG tablet Take 81 mg by mouth daily.    . Cholecalciferol (VITAMIN D3) 5000 UNITS TABS Take 5,000 Units by mouth every morning.     . clonazePAM (KLONOPIN) 0.5 MG tablet Take 1 tablet (0.5 mg total) by mouth 2 (two) times daily as needed for anxiety. 12 tablet 3  . diclofenac (VOLTAREN) 75 MG EC tablet TAKE 1 TABLET TWICE A DAY (Patient taking differently: Take 75 mg by mouth 2 (two) times daily. **DO NOT CRUSH**) 180 tablet 4  . estradiol (ESTRACE) 1 MG tablet Take 1 mg by mouth every other day.     . Flaxseed, Linseed, (FLAXSEED OIL) 1000 MG CAPS Take 1 capsule by mouth daily.    . furosemide (LASIX) 40 MG tablet Take 1 tablet (40 mg total) by mouth daily as needed for edema. (Patient taking  differently: Take 40 mg by mouth daily as needed. ) 90 tablet 3  . gabapentin (NEURONTIN) 300 MG capsule Take 1 capsule (300 mg total) by mouth at bedtime. 90 capsule 4  . losartan (COZAAR) 25 MG tablet TAKE 1 TABLET BY MOUTH EVERY DAY 30 tablet 0  . magnesium gluconate (MAGONATE) 500 MG tablet Take 500 mg by mouth 2 (two) times daily. At bedtime for legcramps    . medroxyPROGESTERone (PROVERA) 2.5 MG tablet Take 2.5 mg by mouth every other day.     . metoprolol tartrate (LOPRESSOR) 25 MG tablet TAKE 1 TABLET TWICE A DAY (Patient taking differently: Take 25 mg by mouth 2 (two) times daily. ) 180 tablet 4  . RABEprazole (ACIPHEX) 20 MG tablet Take 20 mg by mouth 2 (two) times daily.     . rosuvastatin (CRESTOR) 10 MG tablet Take 1 tablet (10 mg total) by mouth daily. 90 tablet 1  . vitamin E 400 UNIT capsule Take 400 Units by mouth daily.    [name] zolpidem (AMBIEN) 5 MG tablet Take 1 tablet (5 mg total) by mouth at  bedtime as needed for sleep. 30 tablet 1   Social History   Socioeconomic History  . Marital status: Married    Spouse name: Not on file  . Number of children: 0  . Years of education: some coll  . Highest education level: Not on file  Occupational History  . Occupation: call center    Employer: [employer]    Comment: [employer]  Social Needs  . Financial resource strain: Not on file  . Food insecurity    Worry: Not on file    Inability: Not on file  . Transportation needs    Medical: Not on file    Non-medical: Not on file  Tobacco Use  . Smoking status: Current Every Day Smoker    Packs/day: 0.50    Years: 40.00    Pack years: 20.00    Types: Cigarettes  . Smokeless tobacco: Never Used  . Tobacco comment: states she stopped 01-10-18  Substance and Sexual Activity  . Alcohol use: Yes  . Drug use: No  . Sexual activity: Yes    Birth control/protection: None  Lifestyle  . Physical activity    Days per week: Not on file    Minutes per session: Not on file  . Stress: Not on file   Relationships  . Social Herbalist on phone: Not on file    Gets together: Not on file    Attends religious service: Not on file    Active member of club or organization: Not on file    Attends meetings of clubs or organizations: Not on file    Relationship status: Not on file  . Intimate partner violence    Fear of current or ex partner: Not on file    Emotionally abused: Not on file    Physically abused: Not on file    Forced sexual activity: Not on file  Other Topics Concern  . Not on file  Social History Narrative   Patient is single and lives at home with her partner. Patient works at [employer] center. Some college education. Patient drinks coffee and tea.   Family History  Problem Relation Age of Onset  . Hypertension Mother   . Hypertension Father   . Heart disease Father        before age 78  . Other Father        varicose veins  . COPD Father   . Colon cancer Neg Hx   . Celiac disease Neg Hx   . Inflammatory bowel disease Neg Hx     OBJECTIVE:  Vitals:   03/05/19 1547  BP: (!) 147/85  Pulse: 77  Resp: 20  Temp: 99 F (37.2 C)  SpO2: 94%     General appearance: alert; no distress HENT: NCAT; PERRL, EOMI grossly CV: Radial pulse 2+ Skin: approximately 2 cm abscess of her proximal left forearm, mild overlying erythema; tender to touch; no active drainage Psychological: alert and cooperative; normal mood and affect  ASSESSMENT & PLAN:  1. Abscess of left forearm     Meds ordered this encounter  Medications  . doxycycline (VIBRAMYCIN) 100 MG capsule    Sig: Take 1 capsule (100 mg total) by mouth 2 (two) times daily.    Dispense:  20 capsule    Refill:  0    Order Specific Question:   Supervising Provider    Answer:   Raylene Everts [id number]   Apply warm compresses 3-4x daily for 10-15 minutes Wash site  daily with warm water and mild soap Keep covered to avoid friction Take antibiotic as prescribed and to completion Follow up here  or with PCP if symptoms persists Return or go to the ED if you have any new or worsening symptoms increased redness, swelling, pain, nausea, vomiting, fever, chills, etc...   Reviewed expectations re: course of current medical issues. Questions answered. Outlined signs and symptoms indicating need for more acute intervention. Patient verbalized understanding. After Visit Summary given.          Lestine Box, PA-C 03/05/19 1559

## 2019-03-05 NOTE — Discharge Instructions (Addendum)
Apply warm compresses 3-4x daily for 10-15 minutes Wash site daily with warm water and mild soap Keep covered to avoid friction Take antibiotic as prescribed and to completion Follow up here or with PCP if symptoms persists Return or go to the ED if you have any new or worsening symptoms increased redness, swelling, pain, nausea, vomiting, fever, chills, etc...  

## 2019-03-05 NOTE — ED Triage Notes (Signed)
Pt presents with abscess on left forearm for last week

## 2019-04-07 ENCOUNTER — Ambulatory Visit: Payer: BC Managed Care – PPO | Admitting: Family Medicine

## 2019-04-07 ENCOUNTER — Other Ambulatory Visit: Payer: Self-pay

## 2019-04-07 ENCOUNTER — Encounter: Payer: Self-pay | Admitting: Family Medicine

## 2019-04-07 VITALS — BP 122/78 | HR 89 | Temp 98.3°F | Ht 66.0 in | Wt 232.4 lb

## 2019-04-07 DIAGNOSIS — L989 Disorder of the skin and subcutaneous tissue, unspecified: Secondary | ICD-10-CM | POA: Diagnosis not present

## 2019-04-07 MED ORDER — CLINDAMYCIN HCL 150 MG PO CAPS: 150.0000 mg | ORAL_CAPSULE | Freq: Three times a day (TID) | ORAL | 0 refills | Status: DC

## 2019-04-07 NOTE — Patient Instructions (Signed)
Clindamycin -three times a day Cover while awake Call on culture

## 2019-04-07 NOTE — Progress Notes (Signed)
Established Patient Office Visit  Subjective:  Patient ID: Michelle Patton, female    DOB: 12/22/1955  Age: 63 y.o. MRN: [id number]  CC:  Chief Complaint  Patient presents with  . Recurrent Skin Infections    left arm    HP Alda Ponder presents for abscess No I and D at [facility]-no culture completed. Pt took doxy for treatment but area become larger.  Pt states area improved when clindamycin started for a dental infection.  Pt states improved with less drainage, redness and pain. Pt request additional medication  Past Medical History:  Diagnosis Date  . Anxiety   . Aortic regurgitation 07/20/2014  . Aortic stenosis, severe 07/20/2014  . Arthritis   . DVT (deep venous thrombosis) ([place])   . Family history of adverse reaction to anesthesia    Reports father deliurm in his 18's with CABG  . GERD (gastroesophageal reflux disease)   . History of pneumonia   . Hyperlipidemia   . Hypertension   . Insomnia, unspecified   . Muscle weakness (generalized)   . Peripheral artery disease ([place])   . 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 Sleep apnea    diagnosed multiple years ago at [facility]  . Supravalvular aortic stenosis, congenital - s/p repair during childhood     Past Surgical History:  Procedure Laterality Date  . ABDOMINAL AORTAGRAM  06/24/12  . ABDOMINAL AORTAGRAM N/A 06/24/2012   Procedure: ABDOMINAL Maxcine Ham;  Surgeon: Angelia Mould, MD;  Location: [facility] CATH LAB;  Service: Cardiovascular;  Laterality: N/A;  . AORTIC VALVE REPLACEMENT N/A 10/07/2014   Procedure: REDO AORTIC VALVE REPLACEMENT (AVR);  Surgeon: Rexene Alberts, MD;  Location: [place];  Service: Open Heart Surgery;  Laterality: N/A;  . ASCENDING AORTIC ROOT REPLACEMENT N/A 10/07/2014   Procedure: ASCENDING AORTIC ROOT REPLACEMENT;  Surgeon: Rexene Alberts, MD;  Location: [place];  Service: Open Heart Surgery;  Laterality: N/A;  . BIOPSY  09/27/2016   Procedure: BIOPSY;  Surgeon: Daneil Dolin, MD;  [facility];  Service: Endoscopy;;  colon  . BREAST REDUCTION SURGERY Bilateral 01/21/2018   Procedure: BREAST REDUCTION WITH LIPOSUCTION;  Surgeon: Cristine Polio, MD;  Location: [place];  Service: Plastics;  Laterality: Bilateral;  . [place]  . CERVICAL FUSION    . CHOLECYSTECTOMY    . COLONOSCOPY WITH PROPOFOL N/A 09/27/2016   Dr. Gala Romney: Diverticulosis, several tubular adenomas removed ranging 4 to 7 mm in size, internal grade 1 hemorrhoids, terminal ileum normal, segmental biopsies negative for microscopic colitis.  Next colonoscopy June 2021  . ESOPHAGOGASTRODUODENOSCOPY (EGD) WITH PROPOFOL N/A 09/27/2016   Dr. Gala Romney: Small hiatal hernia, mild Schatzki ring status post disruption, LA grade a esophagitis  . ILIAC ARTERY STENT Left 12/2007  . KNEE ARTHROSCOPY WITH MEDIAL MENISECTOMY Right 01/10/2018   Procedure: RIGHT KNEE ARTHROSCOPY WITH PARTIAL MEDIAL MENISECTOMY;  Surgeon: Carole Civil, MD;  [facility];  Service: Orthopedics;  Laterality: Right;  . LEFT AND RIGHT HEART CATHETERIZATION WITH CORONARY ANGIOGRAM N/A 07/31/2014   Procedure: LEFT AND RIGHT HEART CATHETERIZATION WITH CORONARY ANGIOGRAM;  Surgeon: Burnell Blanks, MD;  Location: [facility] CATH LAB;  Service: Cardiovascular;  Laterality: N/A;  . Venia Minks DILATION N/A 09/27/2016   Procedure: Venia Minks DILATION;  Surgeon: Daneil Dolin, MD;  [facility];  Service: Endoscopy;  Laterality: N/A;  . POLYPECTOMY  09/27/2016   Procedure: POLYPECTOMY;  Surgeon: Daneil Dolin, MD;  [facility];  Service: Endoscopy;;  colon  . ROTATOR CUFF REPAIR Bilateral   . TEE WITHOUT CARDIOVERSION N/A 07/07/2014   Procedure: TRANSESOPHAGEAL ECHOCARDIOGRAM (TEE);  Surgeon: Arnoldo Lenis, MD;  [facility];   Service: Cardiology;  Laterality: N/A;  . TEE WITHOUT CARDIOVERSION N/A 07/20/2014   Procedure: TRANSESOPHAGEAL ECHOCARDIOGRAM (TEE) WITH PROPOFOL;  Surgeon: Arnoldo Lenis, MD;  [facility];  Service: Endoscopy;  Laterality: N/A;  . TEE WITHOUT CARDIOVERSION N/A 10/07/2014   Procedure: TRANSESOPHAGEAL ECHOCARDIOGRAM (TEE);  Surgeon: Rexene Alberts, MD;  Location: [place];  Service: Open Heart Surgery;  Laterality: N/A;    Family History  Problem Relation Age of Onset  . Hypertension Mother   . Hypertension Father   . Heart disease Father        before age 74  . Other Father        varicose veins  . COPD Father   . Colon cancer Neg Hx   . Celiac disease Neg Hx   . Inflammatory bowel disease Neg Hx     Social History   Socioeconomic History  . Marital status: Married    Spouse name: Not on file  . Number of children: 0  . Years of education: some coll  . Highest education level: Not on file  Occupational History  . Occupation: call center    Employer: [employer]    Comment: [employer]  Tobacco Use  . Smoking status: Current Every Day Smoker    Packs/day: 0.50    Years: 40.00    Pack years: 20.00    Types: Cigarettes  . Smokeless tobacco: Never Used  . Tobacco comment: states she stopped 01-10-18  Substance and Sexual Activity  . Alcohol use: Yes  . Drug use: No  . Sexual activity: Yes    Birth control/protection: None  Other Topics Concern  . Not on file  Social History Narrative   Patient is single and lives at home with her partner. Patient works at [employer] center. Some college education. Patient drinks coffee and tea.   Social Determinants of Health   Financial Resource Strain:   . Difficulty of Paying Living Expenses: Not on file  Food Insecurity:   . Worried About Charity fundraiser in the Last Year: Not on file  . Ran Out of Food in the Last Year: Not on file  Transportation Needs:   . Lack of Transportation (Medical): Not on file  . Lack of Transportation  (Non-Medical): Not on file  Physical Activity:   . Days of Exercise per Week: Not on file  . Minutes of Exercise per Session: Not on file  Stress:   . Feeling of Stress : Not on file  Social Connections:   . Frequency of Communication with Friends and Family: Not on file  . Frequency of Social Gatherings with Friends and Family: Not on file  . Attends Religious Services: Not on file  . Active Member of Clubs or Organizations: Not on file  . Attends Archivist Meetings: Not on file  . Marital Status: Not on file  Intimate Partner Violence:   . Fear of Current or Ex-Partner: Not on file  . Emotionally Abused: Not on file  . Physically Abused: Not on file  . Sexually Abused: Not on file  Outpatient Medications Prior to Visit  Medication Sig Dispense Refill  . aspirin EC 81 MG tablet Take 81 mg by mouth daily.    . Cholecalciferol (VITAMIN D3) 5000 UNITS TABS Take 5,000 Units by mouth every morning.     . clonazePAM (KLONOPIN) 0.5 MG tablet Take 1 tablet (0.5 mg total) by mouth 2 (two) times daily as needed for anxiety. 12 tablet 3  . diclofenac (VOLTAREN) 75 MG EC tablet TAKE 1 TABLET TWICE A DAY (Patient taking differently: Take 75 mg by mouth 2 (two) times daily. **DO NOT CRUSH**) 180 tablet 4  . estradiol (ESTRACE) 1 MG tablet Take 1 mg by mouth every other day.     . Flaxseed, Linseed, (FLAXSEED OIL) 1000 MG CAPS Take 1 capsule by mouth daily.    . furosemide (LASIX) 40 MG tablet Take 1 tablet (40 mg total) by mouth daily as needed for edema. (Patient taking differently: Take 40 mg by mouth daily as needed. ) 90 tablet 3  . gabapentin (NEURONTIN) 300 MG capsule Take 1 capsule (300 mg total) by mouth at bedtime. 90 capsule 4  . losartan (COZAAR) 25 MG tablet TAKE 1 TABLET BY MOUTH EVERY DAY 30 tablet 0  . magnesium gluconate (MAGONATE) 500 MG tablet Take 500 mg by mouth 2 (two) times daily. At bedtime for legcramps    . medroxyPROGESTERone (PROVERA) 2.5 MG tablet Take 2.5  mg by mouth every other day.     . metoprolol tartrate (LOPRESSOR) 25 MG tablet TAKE 1 TABLET TWICE A DAY (Patient taking differently: Take 25 mg by mouth 2 (two) times daily. ) 180 tablet 4  . RABEprazole (ACIPHEX) 20 MG tablet Take 20 mg by mouth 2 (two) times daily.     . rosuvastatin (CRESTOR) 10 MG tablet Take 1 tablet (10 mg total) by mouth daily. 90 tablet 1  . vitamin E 400 UNIT capsule Take 400 Units by mouth daily.    [name] zolpidem (AMBIEN) 5 MG tablet Take 1 tablet (5 mg total) by mouth at bedtime as needed for sleep. 30 tablet 1  . doxycycline (VIBRAMYCIN) 100 MG capsule Take 1 capsule (100 mg total) by mouth 2 (two) times daily. (Patient not taking: Reported on 04/07/2019) 20 capsule 0   No facility-administered medications prior to visit.    Allergies  Allergen Reactions  . Penicillins Hives    Has patient had a PCN reaction causing immediate rash, facial/tongue/throat swelling, SOB or lightheadedness with hypotension: Yes Has patient had a PCN reaction causing severe rash involving mucus membranes or skin necrosis: No Has patient had a PCN reaction that required hospitalization No Has patient had a PCN reaction occurring within the last 10 years: No If all of the above answers are "NO", then may proceed with Cephalosporin use.  Have taken Keflex before without any adverse reaction.   . Sulfa Antibiotics Nausea And Vomiting    ROS Review of Systems  Constitutional: Negative.   Musculoskeletal: Negative for arthralgias and myalgias.  Skin: Positive for wound.       2x2cm-eryth-clear fluid on palpation-collected for culture      Objective:    Physical Exam  Constitutional: She is oriented to person, place, and time.  Cardiovascular: Normal rate and regular rhythm.  Pulmonary/Chest: Effort normal and breath sounds normal.  Neurological: She is alert and oriented to person, place, and time.  Skin: There is erythema.    BP 122/78 (BP Location: Left Arm, Patient  Position: Sitting, Cuff Size: Normal)  Pulse 89   Temp 98.3 F (36.8 C) (Oral)   Ht 5\' 6"  (1.676 m)   Wt 232 lb 6.4 oz (105.4 kg)   SpO2 98%   BMI 37.51 kg/m  Wt Readings from Last 3 Encounters:  04/07/19 232 lb 6.4 oz (105.4 kg)  02/26/19 224 lb 8 oz (101.8 kg)  01/23/19 222 lb (100.7 kg)     Health Maintenance Due  Topic Date Due  . Hepatitis C Screening  Oct 06, 1955  . HIV Screening  02/17/1972  . TETANUS/TDAP  02/17/1976  . PAP SMEAR-Modifier  02/16/1978  . MAMMOGRAM  02/17/2007     Lab Results  Component Value Date   TSH 2.60 12/13/2018   Lab Results  Component Value Date   WBC 8.2 12/27/2018   HGB 14.6 12/27/2018   HCT 45.2 12/27/2018   MCV 93.4 12/27/2018   PLT 263 12/27/2018   Lab Results  Component Value Date   NA 137 12/27/2018   K 3.9 12/27/2018   CO2 26 12/27/2018   GLUCOSE 89 12/27/2018   BUN 12 12/27/2018   CREATININE 0.95 12/27/2018   BILITOT 0.7 10/26/2018   ALKPHOS 48 10/26/2018   AST 21 10/26/2018   ALT 22 10/26/2018   PROT 7.4 10/26/2018   ALBUMIN 4.2 10/26/2018   CALCIUM 9.3 12/27/2018   ANIONGAP 8 12/27/2018   Lab Results  Component Value Date   CHOL 282 (H) 12/13/2018   Lab Results  Component Value Date   HDL 39 (L) 12/13/2018   Lab Results  Component Value Date   [facility]  12/13/2018     Comment:     . LDL cholesterol not calculated. Triglyceride levels greater than 400 mg/dL invalidate calculated LDL results. . Reference range: <100 . Desirable range <100 mg/dL for primary prevention;   <70 mg/dL for patients with CHD or diabetic patients  with > or = 2 CHD risk factors. [name] LDL-C is now calculated using the Martin-Hopkins  calculation, which is a validated novel method providing  better accuracy than the Friedewald equation in the  estimation of LDL-C.  Cresenciano Genre et al. Annamaria Helling. [id number]): 2061-2068  (http://education.[facility].com/faq/FAQ164)    Lab Results  Component Value Date   TRIG 457 (H)  12/13/2018   Lab Results  Component Value Date   CHOLHDL 7.2 (H) 12/13/2018   Lab Results  Component Value Date   HGBA1C 5.5 10/05/2014      Assessment & Plan:   1. Skin lesion Clindamycin-rx - Wound culture Follow-up:  As needed  [place] Hannah Beat, MD

## 2019-04-08 ENCOUNTER — Telehealth: Payer: Self-pay | Admitting: Family Medicine

## 2019-04-08 NOTE — Telephone Encounter (Signed)
Patient is calling and states that yesterday when she was seen her and Dr. Holly Bodily thought the dentist gave her clindamycin (CLEOCIN) 150 MG capsule.   Patient was given 300mg  4x a day. She would like to know if this can be corrected.

## 2019-04-09 ENCOUNTER — Other Ambulatory Visit: Payer: Self-pay | Admitting: Cardiology

## 2019-04-09 NOTE — Telephone Encounter (Signed)
Patient is aware of the recommendation 

## 2019-04-09 NOTE — Telephone Encounter (Signed)
I would still recommend the smaller dose to continue for your skin infection.  Thank you for updating the dose taken for oral infection

## 2019-04-09 NOTE — Telephone Encounter (Signed)
Routing to Dr. Corum for advice ? 

## 2019-04-10 LAB — WOUND CULTURE
MICRO NUMBER:: 1197196
SPECIMEN QUALITY:: ADEQUATE

## 2019-04-11 DIAGNOSIS — L989 Disorder of the skin and subcutaneous tissue, unspecified: Secondary | ICD-10-CM | POA: Insufficient documentation

## 2019-04-23 ENCOUNTER — Other Ambulatory Visit: Payer: Self-pay

## 2019-04-23 ENCOUNTER — Ambulatory Visit (INDEPENDENT_AMBULATORY_CARE_PROVIDER_SITE_OTHER): Payer: BC Managed Care – PPO | Admitting: Neurology

## 2019-04-23 ENCOUNTER — Ambulatory Visit: Payer: BC Managed Care – PPO | Admitting: Neurology

## 2019-04-23 DIAGNOSIS — M545 Low back pain, unspecified: Secondary | ICD-10-CM

## 2019-04-23 DIAGNOSIS — R252 Cramp and spasm: Secondary | ICD-10-CM | POA: Diagnosis not present

## 2019-04-23 DIAGNOSIS — Z0289 Encounter for other administrative examinations: Secondary | ICD-10-CM

## 2019-04-23 MED ORDER — GABAPENTIN (ONCE-DAILY) 300 MG PO TABS: 600.0000 mg | ORAL_TABLET | Freq: Every day | ORAL | 3 refills | Status: DC

## 2019-04-23 NOTE — Procedures (Addendum)
Full Name: Michelle Patton Gender: Female MRN #: [id number] Date of Birth: 22-Jun-1955    Visit Date: 04/23/2019 07:07 Age: 63 Years Examining Physician: Marcial Pacas, MD  Referring Physician: Marcial Pacas, MD History: 63 years old female, with history of chronic low back pain, radiating pain to left hip, frequent left leg muscle cramping,  Summary of the tests:  Nerve conduction study:  Bilateral sural, superficial peroneal sensory responses were normal.  Bilateral peroneal to EDB, and the tibial motor responses were normal, with exception of right tibial motor response proximal stimulation sites, patient could not tolerate the stimulation.  Electromyography: Selected needle examination of bilateral lower extremity muscles and bilateral lumbosacral paraspinal muscles showed no significant abnormality.    Conclusion: This is a normal study.  There is no electrodiagnostic evidence of large fiber peripheral neuropathy, or bilateral lumbosacral radiculopathy.    ------------------------------- Physician Name, M.D.  [facility] [place], [place] [address] Tel: [phone] Fax: [phone]         [facility]    Nerve / Sites Muscle Latency Ref. Amplitude Ref. Rel Amp Segments Distance Velocity Ref. Area    ms ms mV mV %  cm m/s m/s mVms  L Peroneal - EDB     Ankle EDB 5.4 ?6.5 5.6 ?2.0 100 Ankle - EDB 9   20.2     Fib head EDB 11.7  5.1  91.8 Fib head - Ankle 29 46 ?44 19.3     Pop fossa EDB 13.9  5.1  99.2 Pop fossa - Fib head 10 44 ?44 19.1         Pop fossa - Ankle      R Peroneal - EDB     Ankle EDB 4.2 ?6.5 6.8 ?2.0 100 Ankle - EDB 9   23.5     Fib head EDB 10.3  6.0  88.5 Fib head - Ankle 29 48 ?44 21.3     Pop fossa EDB 12.4  6.2  102 Pop fossa - Fib head 10 47 ?44 22.1         Pop fossa - Ankle      L Tibial - AH     Ankle AH 4.8 ?5.8 5.8 ?4.0 100 Ankle - AH 9   11.8     Pop fossa AH 14.4  3.6  61.9 Pop fossa - Ankle 39 41 ?41 15.8  R  Tibial - AH     Ankle AH 5.4 ?5.8 4.6 ?4.0 100 Ankle - AH 9   9.0     Pop fossa AH 11.8  0.8  17.5 Pop fossa - Ankle 39 60 ?41 3.4             SNC    Nerve / Sites Rec. Site Peak Lat Ref.  Amp Ref. Segments Distance    ms ms V V  cm  L Sural - Ankle (Calf)     Calf Ankle 3.9 ?4.4 15 ?6 Calf - Ankle 14  R Sural - Ankle (Calf)     Calf Ankle 3.2 ?4.4 14 ?6 Calf - Ankle 14  L Superficial peroneal - Ankle     Lat leg Ankle 4.3 ?4.4 6 ?6 Lat leg - Ankle 14  R Superficial peroneal - Ankle     Lat leg Ankle 4.1 ?4.4 6 ?6 Lat leg - Ankle 14             F  Wave    Nerve  F Lat Ref.   ms ms  L Tibial - AH 56.0 ?56.0  R Tibial - AH 55.6 ?56.0         EMG Summary Table    Spontaneous MUAP Recruitment  Muscle IA Fib PSW Fasc Other Amp Dur. Poly Pattern  L. Tibialis anterior Normal None None None _______ Normal Normal Normal Normal  L. Tibialis posterior Normal None None None _______ Normal Normal Normal Normal  L. Peroneus longus Normal None None None _______ Normal Normal Normal Normal  L. Gastrocnemius (Medial head) Normal None None None _______ Normal Normal Normal Normal  L. Vastus lateralis Normal None None None _______ Normal Normal Normal Normal  R. Tibialis anterior Normal None None None _______ Normal Normal Normal Normal  R. Tibialis posterior Normal None None None _______ Normal Normal Normal Normal  R. Peroneus longus Normal None None None _______ Normal Normal Normal Normal  R. Gastrocnemius (Medial head) Normal None None None _______ Normal Normal Normal Normal  R. Vastus lateralis Normal None None None _______ Normal Normal Normal Normal  R. Lumbar paraspinals (mid) Normal None None None _______ Normal Normal Normal Normal  R. Lumbar paraspinals (low) Normal None None None _______ Normal Normal Normal Normal  L. Lumbar paraspinals (mid) Normal None None None _______ Normal Normal Normal Normal  L. Lumbar paraspinals (low) Normal None None None _______ Normal Normal Normal  Normal

## 2019-04-23 NOTE — Progress Notes (Signed)
Patient return for EMG nerve conduction study today, which is normal  Gabapentin 300 mg every night did help her left leg cramping, she reported 50% improvement, less frequent, less severe, only take clonazepam occasionally if she cannot walk her left leg muscle cramping away, muscle cramping usually involve left calf, left anterior thigh.  She is referred by her primary care to see vascular surgeon to rule out left lower extremity peripheral vascular disease  Return to clinic in 1 year

## 2019-04-29 ENCOUNTER — Other Ambulatory Visit: Payer: Self-pay | Admitting: *Deleted

## 2019-04-29 MED ORDER — GABAPENTIN 300 MG PO CAPS: 600.0000 mg | ORAL_CAPSULE | Freq: Every day | ORAL | 4 refills | Status: DC

## 2019-04-29 MED ORDER — GABAPENTIN 300 MG PO CAPS: 600.0000 mg | ORAL_CAPSULE | Freq: Every day | ORAL | 11 refills | Status: DC

## 2019-04-29 MED ORDER — GABAPENTIN (ONCE-DAILY) 300 MG PO TABS: 600.0000 mg | ORAL_TABLET | Freq: Every day | ORAL | 3 refills | Status: DC

## 2019-04-29 NOTE — Addendum Note (Signed)
Addended by: Marcial Pacas on: 04/29/2019 04:44 PM   Modules accepted: Orders

## 2019-05-07 ENCOUNTER — Other Ambulatory Visit: Payer: Self-pay | Admitting: Orthopedic Surgery

## 2019-05-07 DIAGNOSIS — M1712 Unilateral primary osteoarthritis, left knee: Secondary | ICD-10-CM

## 2019-05-20 NOTE — H&P (Signed)
Surgical History & Physical  Patient Name: Michelle Patton DOB: 02-05-1956  Surgery: Cataract extraction with intraocular lens implant phacoemulsification; Left Eye  Surgeon: Baruch Goldmann MD Surgery Date:  05/30/2019 Pre-Op Date:  05/08/2019  HPI: A 77 Yr. old female patient -referred by Dr. Hassell Done for cataract evaluation 1. The patient complains of difficulty when driving at night, which began 2 years ago. Both eyes are affected. The episode is constant and gradual. The patient describes foggy, ghosting, glare and hazy symptoms affecting their eyes/vision. The condition's severity is moderate. Symptoms occur when the patient is driving. Patient ready to consider surgery for BCVA due to poor VA overall, worse with night driving and lights. HPI Completed by Dr. Baruch Goldmann  Medical History: Retinal Tear-s/p laser OS K ulcer/abrasion due to CL wear 1980s then d/c CL High myopia Cataracts Depression/Anxiety High Blood Pressure LDL  Review of Systems Negative Allergic/Immunologic Negative Cardiovascular Negative Constitutional Negative Ear, Nose, Mouth & Throat Negative Endocrine Negative Eyes Negative Gastrointestinal Negative Genitourinary Negative Hemotologic/Lymphatic Negative Integumentary Negative Musculoskeletal Negative Neurological Negative Psychiatry Negative Respiratory  Social   Current every day smoker   Medication Aspirin, Clindamycin, Clonazepam, Gabapentin, Losartan Potassium, Rosuvastatin, Lopressor,   Sx/Procedures Retinal Laser,  Open Heart-aortic valve replacement, Fusion of Neck, Rotator Cuff,   Drug Allergies  Penicillin, Sulfa,   History & Physical: Heent:  Cataract, Left eye NECK: supple without bruits LUNGS: lungs clear to auscultation CV: regular rate and rhythm Abdomen: soft and non-tender  Impression & Plan: Assessment: 1.  COMBINED FORMS AGE RELATED CATARACT; Both Eyes (H25.813)  Plan: 1.  Cataract accounts for the patient's decreased  vision. This visual impairment is not correctable with a tolerable change in glasses or contact lenses. Cataract surgery with an implantation of a new lens should significantly improve the visual and functional status of the patient. Discussed all risks, benefits, alternatives, and potential complications. Discussed the procedures and recovery. Patient desires to have surgery. A-scan ordered and performed today for intra-ocular lens calculations. The surgery will be performed in order to improve vision for driving, reading, and for eye examinations. Recommend phacoemulsification with intra-ocular lens. Left Eye worse - first. Dilates well - shugarcaine by protocol.

## 2019-05-27 NOTE — Patient Instructions (Signed)
Michelle Patton  05/27/2019     @PREFPERIOPPHARMACY @   Your procedure is scheduled on  05/30/2019  Report to [facility] [place].M.  Call this number if you have problems the morning of surgery:  [phone]   Remember:  Do not eat or drink after midnight.                        Take these medicines the morning of surgery with A SIP OF WATER  Clonazepam, gabapentin,losartan, metoprolol,rabprazole.    Do not wear jewelry, make-up or nail polish.  Do not wear lotions, powders, or perfumes.Please brush your teeth and wear deodorant.  Do not shave 48 hours prior to surgery.  Men may shave face and neck.  Do not bring valuables to the hospital.  [facility] is not responsible for any belongings or valuables.  Contacts, dentures or bridgework may not be worn into surgery.  Leave your suitcase in the car.  After surgery it may be brought to your room.  For patients admitted to the hospital, discharge time will be determined by your treatment team.  Patients discharged the day of surgery will not be allowed to drive home.   Name and phone number of your driver:   family Special instructions:  None  Please read over the following fact sheets that you were given. Anesthesia Post-op Instructions and Care and Recovery After Surgery       Cataract Surgery, Care After This sheet gives you information about how to care for yourself after your procedure. Your health care provider may also give you more specific instructions. If you have problems or questions, contact your health care provider. What can I expect after the procedure? After the procedure, it is common to have:  Itching.  Discomfort.  Fluid discharge.  Sensitivity to light and to touch.  Bruising in or around the eye.  Mild blurred vision. Follow these instructions at home: Eye care   Do not touch or rub your eyes.  Protect your eyes as told by your health care provider. You may be told to wear a  protective eye shield or sunglasses.  Do not put a contact lens into the affected eye or eyes until your health care provider approves.  Keep the area around your eye clean and dry: ? Avoid swimming. ? Do not allow water to hit you directly in the face while showering. ? Keep soap and shampoo out of your eyes.  Check your eye every day for signs of infection. Watch for: ? Redness, swelling, or pain. ? Fluid, blood, or pus. ? Warmth. ? A bad smell. ? Vision that is getting worse. ? Sensitivity that is getting worse. Activity  Do not drive for 24 hours if you were given a sedative during your procedure.  Avoid strenuous activities, such as playing contact sports, for as long as told by your health care provider.  Do not drive or use heavy machinery until your health care provider approves.  Do not bend or lift heavy objects. Bending increases pressure in the eye. You can walk, climb stairs, and do light household chores.  Ask your health care provider when you can return to work. If you work in a dusty environment, you may be advised to wear protective eyewear for a period of time. General instructions  Take or apply over-the-counter and prescription medicines only as told by your health care provider. This includes  eye drops.  Keep all follow-up visits as told by your health care provider. This is important. Contact a health care provider if:  You have increased bruising around your eye.  You have pain that is not helped with medicine.  You have a fever.  You have redness, swelling, or pain in your eye.  You have fluid, blood, or pus coming from your incision.  Your vision gets worse.  Your sensitivity to light gets worse. Get help right away if:  You have sudden loss of vision.  You see flashes of light or spots (floaters).  You have severe eye pain.  You develop nausea or vomiting. Summary  After your procedure, it is common to have itching, discomfort,  bruising, fluid discharge, or sensitivity to light.  Follow instructions from your health care provider about caring for your eye after the procedure.  Do not rub your eye after the procedure. You may need to wear eye protection or sunglasses. Do not wear contact lenses. Keep the area around your eye clean and dry.  Avoid activities that require a lot of effort. These include playing sports and lifting heavy objects.  Contact a health care provider if you have increased bruising, pain that does not go away, or a fever. Get help right away if you suddenly lose your vision, see flashes of light or spots, or have severe pain in the eye. This information is not intended to replace advice given to you by your health care provider. Make sure you discuss any questions you have with your health care provider. Document Revised: 02/04/2019 Document Reviewed: 10/08/2017 Elsevier Patient Education  2020 [place] After These instructions provide you with information about caring for yourself after your procedure. Your health care provider may also give you more specific instructions. Your treatment has been planned according to current medical practices, but problems sometimes occur. Call your health care provider if you have any problems or questions after your procedure. What can I expect after the procedure? After your procedure, you may:  Feel sleepy for several hours.  Feel clumsy and have poor balance for several hours.  Feel forgetful about what happened after the procedure.  Have poor judgment for several hours.  Feel nauseous or vomit.  Have a sore throat if you had a breathing tube during the procedure. Follow these instructions at home: For at least 24 hours after the procedure:      Have a responsible adult stay with you. It is important to have someone help care for you until you are awake and alert.  Rest as needed.  Do not: ? Participate  in activities in which you could fall or become injured. ? Drive. ? Use heavy machinery. ? Drink alcohol. ? Take sleeping pills or medicines that cause drowsiness. ? Make important decisions or sign legal documents. ? Take care of children on your own. Eating and drinking  Follow the diet that is recommended by your health care provider.  If you vomit, drink water, juice, or soup when you can drink without vomiting.  Make sure you have little or no nausea before eating solid foods. General instructions  Take over-the-counter and prescription medicines only as told by your health care provider.  If you have sleep apnea, surgery and certain medicines can increase your risk for breathing problems. Follow instructions from your health care provider about wearing your sleep device: ? Anytime you are sleeping, including during daytime naps. ? While taking prescription pain  medicines, sleeping medicines, or medicines that make you drowsy.  If you smoke, do not smoke without supervision.  Keep all follow-up visits as told by your health care provider. This is important. Contact a health care provider if:  You keep feeling nauseous or you keep vomiting.  You feel light-headed.  You develop a rash.  You have a fever. Get help right away if:  You have trouble breathing. Summary  For several hours after your procedure, you may feel sleepy and have poor judgment.  Have a responsible adult stay with you for at least 24 hours or until you are awake and alert. This information is not intended to replace advice given to you by your health care provider. Make sure you discuss any questions you have with your health care provider. Document Revised: 07/09/2017 Document Reviewed: 08/01/2015 Elsevier Patient Education  [place].

## 2019-05-28 ENCOUNTER — Other Ambulatory Visit (HOSPITAL_COMMUNITY)
Admission: RE | Admit: 2019-05-28 | Discharge: 2019-05-28 | Disposition: A | Discharge status: Home / Self Care | Payer: BC Managed Care – PPO | Source: Ambulatory Visit | Attending: Ophthalmology | Admitting: Ophthalmology

## 2019-05-28 ENCOUNTER — Encounter (HOSPITAL_COMMUNITY)
Admission: RE | Admit: 2019-05-28 | Discharge: 2019-05-28 | Disposition: A | Discharge status: Home / Self Care | Payer: BC Managed Care – PPO | Source: Ambulatory Visit | Attending: Ophthalmology | Admitting: Ophthalmology

## 2019-05-28 ENCOUNTER — Encounter (HOSPITAL_COMMUNITY): Payer: Self-pay

## 2019-05-28 ENCOUNTER — Other Ambulatory Visit: Payer: Self-pay

## 2019-05-28 DIAGNOSIS — Z20822 Contact with and (suspected) exposure to covid-19: Secondary | ICD-10-CM | POA: Insufficient documentation

## 2019-05-28 DIAGNOSIS — Z01812 Encounter for preprocedural laboratory examination: Secondary | ICD-10-CM | POA: Insufficient documentation

## 2019-05-28 LAB — BASIC METABOLIC PANEL
Anion gap: 9 (ref 5–15)
BUN: 17 mg/dL (ref 8–23)
CO2: 23 mmol/L (ref 22–32)
Calcium: 9.1 mg/dL (ref 8.9–10.3)
Chloride: 100 mmol/L (ref 98–111)
Creatinine, Ser: 0.97 mg/dL (ref 0.44–1.00)
GFR calc Af Amer: >60 mL/min (ref >60)
GFR calc non Af Amer: >60 mL/min (ref >60)
Glucose, Bld: 107 mg/dL — ABNORMAL HIGH (ref 70–99)
Potassium: 4.2 mmol/L (ref 3.5–5.1)
Sodium: 132 mmol/L — ABNORMAL LOW (ref 135–145)

## 2019-05-28 LAB — SARS CORONAVIRUS 2 (TAT 6-24 HRS): SARS Coronavirus 2: NEGATIVE

## 2019-05-30 ENCOUNTER — Ambulatory Visit (HOSPITAL_COMMUNITY)
Admission: RE | Admit: 2019-05-30 | Discharge: 2019-05-30 | Disposition: A | Discharge status: Home / Self Care | Payer: BC Managed Care – PPO | Attending: Ophthalmology | Admitting: Ophthalmology

## 2019-05-30 ENCOUNTER — Other Ambulatory Visit: Payer: Self-pay

## 2019-05-30 ENCOUNTER — Ambulatory Visit (HOSPITAL_COMMUNITY): Payer: BC Managed Care – PPO | Admitting: Anesthesiology

## 2019-05-30 ENCOUNTER — Encounter (HOSPITAL_COMMUNITY)
Admission: RE | Disposition: A | Discharge status: Home / Self Care | Payer: Self-pay | Source: Home / Self Care | Attending: Ophthalmology

## 2019-05-30 DIAGNOSIS — K219 Gastro-esophageal reflux disease without esophagitis: Secondary | ICD-10-CM | POA: Diagnosis not present

## 2019-05-30 DIAGNOSIS — Z7982 Long term (current) use of aspirin: Secondary | ICD-10-CM | POA: Diagnosis not present

## 2019-05-30 DIAGNOSIS — Z880 Allergy status to penicillin: Secondary | ICD-10-CM | POA: Diagnosis not present

## 2019-05-30 DIAGNOSIS — H25812 Combined forms of age-related cataract, left eye: Secondary | ICD-10-CM | POA: Insufficient documentation

## 2019-05-30 DIAGNOSIS — Z882 Allergy status to sulfonamides status: Secondary | ICD-10-CM | POA: Diagnosis not present

## 2019-05-30 DIAGNOSIS — Z981 Arthrodesis status: Secondary | ICD-10-CM | POA: Diagnosis not present

## 2019-05-30 DIAGNOSIS — Z79899 Other long term (current) drug therapy: Secondary | ICD-10-CM | POA: Insufficient documentation

## 2019-05-30 DIAGNOSIS — F329 Major depressive disorder, single episode, unspecified: Secondary | ICD-10-CM | POA: Diagnosis not present

## 2019-05-30 DIAGNOSIS — F419 Anxiety disorder, unspecified: Secondary | ICD-10-CM | POA: Insufficient documentation

## 2019-05-30 DIAGNOSIS — Z952 Presence of prosthetic heart valve: Secondary | ICD-10-CM | POA: Diagnosis not present

## 2019-05-30 DIAGNOSIS — I10 Essential (primary) hypertension: Secondary | ICD-10-CM | POA: Diagnosis not present

## 2019-05-30 DIAGNOSIS — I739 Peripheral vascular disease, unspecified: Secondary | ICD-10-CM | POA: Diagnosis not present

## 2019-05-30 DIAGNOSIS — M1990 Unspecified osteoarthritis, unspecified site: Secondary | ICD-10-CM | POA: Diagnosis not present

## 2019-05-30 DIAGNOSIS — G4733 Obstructive sleep apnea (adult) (pediatric): Secondary | ICD-10-CM | POA: Diagnosis not present

## 2019-05-30 DIAGNOSIS — F17200 Nicotine dependence, unspecified, uncomplicated: Secondary | ICD-10-CM | POA: Insufficient documentation

## 2019-05-30 HISTORY — PX: CATARACT EXTRACTION W/PHACO: SHX586

## 2019-05-30 SURGERY — PHACOEMULSIFICATION, CATARACT, WITH IOL INSERTION
Anesthesia: Monitor Anesthesia Care | Site: Eye | Laterality: Left

## 2019-05-30 MED ORDER — ONDANSETRON HCL 4 MG/2ML IJ SOLN
INTRAMUSCULAR | Status: DC | PRN
  Administered 2019-05-30: 4 mg via INTRAVENOUS

## 2019-05-30 MED ORDER — LIDOCAINE HCL 3.5 % OP GEL
1.0000 "application " | Freq: Once | OPHTHALMIC | Status: AC
  Administered 2019-05-30: 1 via OPHTHALMIC

## 2019-05-30 MED ORDER — POVIDONE-IODINE 5 % OP SOLN
OPHTHALMIC | Status: DC | PRN
  Administered 2019-05-30: 1 via OPHTHALMIC

## 2019-05-30 MED ORDER — PHENYLEPHRINE HCL 2.5 % OP SOLN
1.0000 [drp] | OPHTHALMIC | Status: AC | PRN
  Administered 2019-05-30 (×3): 1 [drp] via OPHTHALMIC

## 2019-05-30 MED ORDER — MIDAZOLAM HCL 2 MG/2ML IJ SOLN
INTRAMUSCULAR | Status: AC
  Filled 2019-05-30: qty 2

## 2019-05-30 MED ORDER — MIDAZOLAM HCL 2 MG/2ML IJ SOLN
INTRAMUSCULAR | Status: DC | PRN
  Administered 2019-05-30 (×2): 1 mg via INTRAVENOUS

## 2019-05-30 MED ORDER — ONDANSETRON HCL 4 MG/2ML IJ SOLN
INTRAMUSCULAR | Status: AC
  Filled 2019-05-30: qty 2

## 2019-05-30 MED ORDER — PROVISC 10 MG/ML IO SOLN
INTRAOCULAR | Status: DC | PRN
  Administered 2019-05-30: 0.85 mL via INTRAOCULAR

## 2019-05-30 MED ORDER — LIDOCAINE HCL (PF) 1 % IJ SOLN
INTRAOCULAR | Status: DC | PRN
  Administered 2019-05-30: 1 mL via OPHTHALMIC

## 2019-05-30 MED ORDER — EPINEPHRINE PF 1 MG/ML IJ SOLN
INTRAMUSCULAR | Status: AC
  Filled 2019-05-30: qty 2

## 2019-05-30 MED ORDER — FENTANYL CITRATE (PF) 100 MCG/2ML IJ SOLN
INTRAMUSCULAR | Status: DC | PRN
  Administered 2019-05-30 (×2): 50 ug via INTRAVENOUS

## 2019-05-30 MED ORDER — EPINEPHRINE PF 1 MG/ML IJ SOLN
INTRAOCULAR | Status: DC | PRN
  Administered 2019-05-30: 500 mL

## 2019-05-30 MED ORDER — CYCLOPENTOLATE-PHENYLEPHRINE 0.2-1 % OP SOLN
1.0000 [drp] | OPHTHALMIC | Status: AC | PRN
  Administered 2019-05-30 (×3): 1 [drp] via OPHTHALMIC

## 2019-05-30 MED ORDER — TETRACAINE HCL 0.5 % OP SOLN
1.0000 [drp] | OPHTHALMIC | Status: AC | PRN
  Administered 2019-05-30 (×3): 1 [drp] via OPHTHALMIC

## 2019-05-30 MED ORDER — FENTANYL CITRATE (PF) 100 MCG/2ML IJ SOLN
INTRAMUSCULAR | Status: AC
  Filled 2019-05-30: qty 2

## 2019-05-30 MED ORDER — BSS IO SOLN
INTRAOCULAR | Status: DC | PRN
  Administered 2019-05-30: 15 mL via INTRAOCULAR

## 2019-05-30 MED ORDER — SODIUM HYALURONATE 23 MG/ML IO SOLN
INTRAOCULAR | Status: DC | PRN
  Administered 2019-05-30: 0.6 mL via INTRAOCULAR

## 2019-05-30 SURGICAL SUPPLY — 16 items
CLOTH BEACON ORANGE TIMEOUT ST (SAFETY) ×2 IMPLANT
DEVICE MILOOP (MISCELLANEOUS) IMPLANT
EYE SHIELD UNIVERSAL CLEAR (GAUZE/BANDAGES/DRESSINGS) ×2 IMPLANT
GLOVE BIOGEL PI IND STRL 7.0 (GLOVE) IMPLANT
GLOVE BIOGEL PI INDICATOR 7.0 (GLOVE) ×4
LENS ALC ACRYL/TECN (Ophthalmic Related) ×2 IMPLANT
MILOOP DEVICE (MISCELLANEOUS)
NDL HYPO 18GX1.5 BLUNT FILL (NEEDLE) IMPLANT
NEEDLE HYPO 18GX1.5 BLUNT FILL (NEEDLE) ×3 IMPLANT
PAD ARMBOARD 7.5X6 YLW CONV (MISCELLANEOUS) ×2 IMPLANT
RING MALYGIN 7.0 (MISCELLANEOUS) IMPLANT
SYR TB 1ML LL NO SAFETY (SYRINGE) ×2 IMPLANT
TAPE SURG TRANSPORE 1 IN (GAUZE/BANDAGES/DRESSINGS) IMPLANT
TAPE SURGICAL TRANSPORE 1 IN (GAUZE/BANDAGES/DRESSINGS) ×2
VISCOELASTIC ADDITIONAL (OPHTHALMIC RELATED) ×2 IMPLANT
WATER STERILE IRR 250ML POUR (IV SOLUTION) ×2 IMPLANT

## 2019-05-30 NOTE — Anesthesia Preprocedure Evaluation (Addendum)
Anesthesia Evaluation  Patient identified by MRN, date of birth, ID band Patient awake  General Assessment Comment:Reports Sister slow to awaken  Denies any personal issues with Anesthesia   Reviewed: Allergy & Precautions, NPO status , Patient's Chart, lab work & pertinent test results  History of Anesthesia Complications (+) Family history of anesthesia reaction  Airway Mallampati: I  TM Distance: >3 FB Neck ROM: Full    Dental no notable dental hx. (+) Loose, Teeth Intact One loose upper left #15:   Pulmonary sleep apnea , Current Smoker and Patient abstained from smoking.,  Reports told she has OSA Lost CPAP machine    Pulmonary exam normal breath sounds clear to auscultation       Cardiovascular Exercise Tolerance: Good hypertension, Pt. on medications and Pt. on home beta blockers + Peripheral Vascular Disease  Normal cardiovascular examI Rhythm:Regular Rate:Normal  S/p Mult cardiac procedures -AVR/Redo  None since 2016  2019 Echo good  Reports can walk a mile  Has Iliac A.  Stent    Neuro/Psych negative neurological ROS  negative psych ROS   GI/Hepatic Neg liver ROS, GERD  Medicated and Controlled,  Endo/Other  negative endocrine ROS  Renal/GU negative Renal ROS  negative genitourinary   Musculoskeletal  (+) Arthritis , Osteoarthritis,    Abdominal   Peds negative pediatric ROS (+)  Hematology negative hematology ROS (+)   Anesthesia Other Findings   Reproductive/Obstetrics negative OB ROS                            Anesthesia Physical Anesthesia Plan  ASA: III  Anesthesia Plan: MAC   Post-op Pain Management:    Induction: Intravenous  PONV Risk Score and Plan: 1 and TIVA  Airway Management Planned: Simple Face Mask and Nasal Cannula  Additional Equipment:   Intra-op Plan:   Post-operative Plan:   Informed Consent: I have reviewed the patients History and  Physical, chart, labs and discussed the procedure including the risks, benefits and alternatives for the proposed anesthesia with the patient or authorized representative who has indicated his/her understanding and acceptance.     Dental advisory given  Plan Discussed with: CRNA  Anesthesia Plan Comments: (Plan Full PPE use  Plan MAC d/w pt -WTP with same after Q&A)        Anesthesia Quick Evaluation

## 2019-05-30 NOTE — Anesthesia Postprocedure Evaluation (Signed)
Anesthesia Post Note  Patient: Michelle Patton  Procedure(s) Performed: CATARACT EXTRACTION PHACO AND INTRAOCULAR LENS PLACEMENT (IOC) (CDE: 4.94  ) (Left Eye)  Patient location during evaluation: PACU Anesthesia Type: MAC Level of consciousness: awake, awake and alert, oriented and patient cooperative Pain management: pain level controlled Vital Signs Assessment: post-procedure vital signs reviewed and stable Respiratory status: spontaneous breathing, respiratory function stable and nonlabored ventilation Cardiovascular status: stable Postop Assessment: no apparent nausea or vomiting Anesthetic complications: no     Last Vitals:  Vitals:   05/30/19 0903  BP: 107/65  Resp: 20  Temp: 36.6 C  SpO2: 94%    Last Pain:  Vitals:   05/30/19 0903  TempSrc: Oral  PainSc: 0-No pain                 Blase Beckner

## 2019-05-30 NOTE — Transfer of Care (Signed)
Immediate Anesthesia Transfer of Care Note  Patient: Michelle Patton  Procedure(s) Performed: CATARACT EXTRACTION PHACO AND INTRAOCULAR LENS PLACEMENT (IOC) (CDE: 4.94  ) (Left Eye)  Patient Location: PACU  Anesthesia Type:MAC  Level of Consciousness: awake, alert , oriented and patient cooperative  Airway & Oxygen Therapy: Patient Spontanous Breathing  Post-op Assessment: Report given to RN and Post -op Vital signs reviewed and stable  Post vital signs: Reviewed and stable  Last Vitals:  Vitals Value Taken Time  BP    Temp    Pulse    Resp    SpO2      Last Pain:  Vitals:   05/30/19 0903  TempSrc: Oral  PainSc: 0-No pain      Patients Stated Pain Goal: 7 (57/84/69 [id number])  Complications: No apparent anesthesia complications

## 2019-05-30 NOTE — Op Note (Signed)
Date of procedure: 05/30/19  Pre-operative diagnosis: Visually significant age-related combined cataract, Left Eye (H25.812)  Post-operative diagnosis: Visually significant age-related combined cataract, Left Eye (H25.812)  Procedure: Removal of cataract via phacoemulsification and insertion of intra-ocular lens [name] and [place]  +12.5D into the capsular bag of the Left Eye  Attending surgeon: Gerda Diss. Niaja Stickley, MD, MA  Anesthesia: MAC, Topical Ak[place]  Complications: None  Estimated Blood Loss: <94m (minimal)  Specimens: None  Implants: As above  Indications:  Visually significant age-related cataract, Left Eye  Procedure:  The patient was seen and identified in the pre-operative area. The operative eye was identified and dilated.  The operative eye was marked.  Topical anesthesia was administered to the operative eye.     The patient was then to the operative suite and placed in the supine position.  A timeout was performed confirming the patient, procedure to be performed, and all other relevant information.   The patient's face was prepped and draped in the usual fashion for intra-ocular surgery.  A lid speculum was placed into the operative eye and the surgical microscope moved into place and focused.  An inferotemporal paracentesis was created using a 20 gauge paracentesis blade.  Shugarcaine was injected into the anterior chamber.  Viscoelastic was injected into the anterior chamber.  A temporal clear-corneal main wound incision was created using a 2.418mmicrokeratome.  A continuous curvilinear capsulorrhexis was initiated using an irrigating cystitome and completed using capsulorrhexis forceps.  Hydrodissection and hydrodeliniation were performed.  Viscoelastic was injected into the anterior chamber.  A phacoemulsification handpiece and a chopper as a second instrument were used to remove the nucleus and epinucleus. The irrigation/aspiration handpiece was used to remove  any remaining cortical material.   The capsular bag was reinflated with viscoelastic, checked, and found to be intact.  The intraocular lens was inserted into the capsular bag.  The irrigation/aspiration handpiece was used to remove any remaining viscoelastic.  The clear corneal wound and paracentesis wounds were then hydrated and checked with Weck-Cels to be watertight.  The lid-speculum and drape was removed, and the patient's face was cleaned with a wet and dry 4x4.   A clear shield was taped over the eye. The patient was taken to the post-operative care unit in good condition, having tolerated the procedure well.  [facility]-Op Instructions: [facility] and will receive all other orders and instructions.

## 2019-05-30 NOTE — Discharge Instructions (Addendum)
Please discharge patient when stable, will follow up today with Dr. Tamakia Porto at the [facility] [place] office immediately following discharge.  Leave shield in place until visit.  All paperwork with discharge instructions will be given at the office.  [facility] Address:  [address]  , [place] [address]             Monitored Anesthesia Care, Care After These instructions provide you with information about caring for yourself after your procedure. Your health care provider may also give you more specific instructions. Your treatment has been planned according to current medical practices, but problems sometimes occur. Call your health care provider if you have any problems or questions after your procedure. What can I expect after the procedure? After your procedure, you may:  Feel sleepy for several hours.  Feel clumsy and have poor balance for several hours.  Feel forgetful about what happened after the procedure.  Have poor judgment for several hours.  Feel nauseous or vomit.  Have a sore throat if you had a breathing tube during the procedure. Follow these instructions at home: For at least 24 hours after the procedure:      Have a responsible adult stay with you. It is important to have someone help care for you until you are awake and alert.  Rest as needed.  Do not: ? Participate in activities in which you could fall or become injured. ? Drive. ? Use heavy machinery. ? Drink alcohol. ? Take sleeping pills or medicines that cause drowsiness. ? Make important decisions or sign legal documents. ? Take care of children on your own. Eating and drinking  Follow the diet that is recommended by your health care provider.  If you vomit, drink water, juice, or soup when you can drink without vomiting.  Make sure you have little or no nausea before eating solid foods. General instructions  Take over-the-counter and  prescription medicines only as told by your health care provider.  If you have sleep apnea, surgery and certain medicines can increase your risk for breathing problems. Follow instructions from your health care provider about wearing your sleep device: ? Anytime you are sleeping, including during daytime naps. ? While taking prescription pain medicines, sleeping medicines, or medicines that make you drowsy.  If you smoke, do not smoke without supervision.  Keep all follow-up visits as told by your health care provider. This is important. Contact a health care provider if:  You keep feeling nauseous or you keep vomiting.  You feel light-headed.  You develop a rash.  You have a fever. Get help right away if:  You have trouble breathing. Summary  For several hours after your procedure, you may feel sleepy and have poor judgment.  Have a responsible adult stay with you for at least 24 hours or until you are awake and alert. This information is not intended to replace advice given to you by your health care provider. Make sure you discuss any questions you have with your health care provider. Document Revised: 07/09/2017 Document Reviewed: 08/01/2015 Elsevier Patient Education  2020 Elsevier Inc.  

## 2019-05-30 NOTE — Interval H&P Note (Signed)
History and Physical Interval Note: The H and P was reviewed and updated. The patient was examined.  No changes were found after exam.  The surgical eye was marked.  05/30/2019 10:08 AM  Michelle Patton  has presented today for surgery, with the diagnosis of Nuclear sclerotic cataract - Left eye.  The various methods of treatment have been discussed with the patient and family. After consideration of risks, benefits and other options for treatment, the patient has consented to  Procedure(s) with comments: CATARACT EXTRACTION PHACO AND INTRAOCULAR LENS PLACEMENT ([place]) (Left) - left as a surgical intervention.  The patient's history has been reviewed, patient examined, no change in status, stable for surgery.  I have reviewed the patient's chart and labs.  Questions were answered to the patient's satisfaction.     Baruch Goldmann

## 2019-06-04 ENCOUNTER — Other Ambulatory Visit (HOSPITAL_COMMUNITY): Payer: Self-pay | Admitting: Endocrinology

## 2019-06-06 NOTE — H&P (Addendum)
Surgical History & Physical  Patient Name: Michelle Patton DOB: 09-01-1955 Surgery: Cataract extraction with intraocular lens implant phacoemulsification; Right Eye  Surgeon: Baruch Goldmann MD Surgery Date:  06/16/2019 Pre-Op Date:  06/05/2019  HPI: A 8 Yr. old female patient 1. 1. The patient is returning after cataract surgery. The left eye is affected. Status post cataract surgery on 05-30-2019: Since the last visit, the affected area feels improvement. The patient's vision is improved. Patient is following medication instructions with 3 in 1 TID. Patient extremely happy with VA OS since surgery, so much clearer and brighter. Patient admits to having a terrible time due to the difference between OU. This is negatively affecting the patient's quality of life. Going without glasses mostly and OD unable to see anything. Patient admits OS getting tired due to OD pulling it down. OD ready for surgery for BCVA. HPI Completed by Dr. Baruch Goldmann  Medical History: Retinal Tear-s/p laser OS K ulcer/abrasion due to CL wear 1980s then d/c CL High myopia Cataracts Depression/Anxiety High Blood Pressure LDL  Review of Systems Negative Allergic/Immunologic Negative Cardiovascular Negative Constitutional Negative Ear, Nose, Mouth & Throat Negative Endocrine Negative Eyes Negative Gastrointestinal Negative Genitourinary Negative Hemotologic/Lymphatic Negative Integumentary Negative Musculoskeletal Negative Neurological Negative Psychiatry Negative Respiratory  Social   Current every day smoker  Medication Prednisolone-gatiflox-bromfenac,  Aspirin, Clindamycin, Clonazepam, Gabapentin, Losartan Potassium, Rosuvastatin, Lopressor,   Sx/Procedures Retinal Laser, Phaco c IOL OS,  Open Heart-aortic valve replacement, Fusion of Neck, Rotator Cuff,   Drug Allergies  Penicillin, Sulfa,   History & Physical: Heent:  Cataract, Right eye NECK: supple without bruits LUNGS: lungs clear to  auscultation CV: regular rate and rhythm Abdomen: soft and non-tender  Impression & Plan: Assessment: 1.  COMBINED FORMS AGE RELATED CATARACT; , Right Eye (H25.811) 2.  CATARACT EXTRACTION STATUS; Left Eye (Z98.42)  Plan: 1.  Cataract accounts for the patient's decreased vision. This visual impairment is not correctable with a tolerable change in glasses or contact lenses. Cataract surgery with an implantation of a new lens should significantly improve the visual and functional status of the patient. Discussed all risks, benefits, alternatives, and potential complications. Discussed the procedures and recovery. Patient desires to have surgery. A-scan ordered and performed today for intra-ocular lens calculations. The surgery will be performed in order to improve vision for driving, reading, and for eye examinations. Recommend phacoemulsification with intra-ocular lens. Right Eye. Surgery required to correct imbalance of vision. Dilates well - shugarcaine by protocol. 2.  1 week after cataract surgery. Doing well with improved vision and normal eye pressure. Call with any problems or concerns. Continue Gati-Brom-Pred 2x/day for 3 more weeks.

## 2019-06-10 ENCOUNTER — Other Ambulatory Visit (HOSPITAL_COMMUNITY): Payer: Self-pay | Admitting: Pharmacy Technician

## 2019-06-10 ENCOUNTER — Other Ambulatory Visit (HOSPITAL_COMMUNITY): Payer: Self-pay | Admitting: *Deleted

## 2019-06-11 ENCOUNTER — Other Ambulatory Visit (HOSPITAL_COMMUNITY)
Admission: RE | Admit: 2019-06-11 | Discharge: 2019-06-11 | Disposition: A | Discharge status: Home / Self Care | Payer: BC Managed Care – PPO | Source: Ambulatory Visit | Attending: Ophthalmology | Admitting: Ophthalmology

## 2019-06-11 ENCOUNTER — Encounter (HOSPITAL_COMMUNITY)
Admission: RE | Admit: 2019-06-11 | Discharge: 2019-06-11 | Disposition: A | Discharge status: Home / Self Care | Payer: BC Managed Care – PPO | Source: Ambulatory Visit | Attending: Ophthalmology | Admitting: Ophthalmology

## 2019-06-11 ENCOUNTER — Other Ambulatory Visit: Payer: Self-pay

## 2019-06-13 ENCOUNTER — Other Ambulatory Visit: Payer: Self-pay

## 2019-06-13 ENCOUNTER — Other Ambulatory Visit (HOSPITAL_COMMUNITY)
Admission: RE | Admit: 2019-06-13 | Discharge: 2019-06-13 | Disposition: A | Discharge status: Home / Self Care | Payer: BC Managed Care – PPO | Source: Ambulatory Visit | Attending: Ophthalmology | Admitting: Ophthalmology

## 2019-06-13 DIAGNOSIS — Z01812 Encounter for preprocedural laboratory examination: Secondary | ICD-10-CM | POA: Insufficient documentation

## 2019-06-13 DIAGNOSIS — Z20822 Contact with and (suspected) exposure to covid-19: Secondary | ICD-10-CM | POA: Diagnosis not present

## 2019-06-13 LAB — SARS CORONAVIRUS 2 (TAT 6-24 HRS): SARS Coronavirus 2: NEGATIVE

## 2019-06-13 MED ORDER — PROMETHAZINE HCL 25 MG/ML IJ SOLN: 6.2500 mg | INTRAMUSCULAR | Status: DC | PRN

## 2019-06-13 MED ORDER — LACTATED RINGERS IV SOLN: INTRAVENOUS | Status: DC

## 2019-06-13 MED ORDER — HYDROMORPHONE HCL 1 MG/ML IJ SOLN: 0.2500 mg | INTRAMUSCULAR | Status: DC | PRN

## 2019-06-13 MED ORDER — HYDROCODONE-ACETAMINOPHEN 7.5-325 MG PO TABS: 1.0000 | ORAL_TABLET | Freq: Once | ORAL | Status: DC | PRN

## 2019-06-13 MED ORDER — MIDAZOLAM HCL 2 MG/2ML IJ SOLN: 0.5000 mg | Freq: Once | INTRAMUSCULAR | Status: DC | PRN

## 2019-06-16 ENCOUNTER — Encounter (HOSPITAL_COMMUNITY): Payer: Self-pay | Admitting: Ophthalmology

## 2019-06-16 ENCOUNTER — Ambulatory Visit (HOSPITAL_COMMUNITY): Payer: BC Managed Care – PPO | Admitting: Anesthesiology

## 2019-06-16 ENCOUNTER — Ambulatory Visit (HOSPITAL_COMMUNITY)
Admission: RE | Admit: 2019-06-16 | Discharge: 2019-06-16 | Disposition: A | Discharge status: Home / Self Care | Payer: BC Managed Care – PPO | Attending: Ophthalmology | Admitting: Ophthalmology

## 2019-06-16 ENCOUNTER — Other Ambulatory Visit: Payer: Self-pay

## 2019-06-16 ENCOUNTER — Encounter (HOSPITAL_COMMUNITY)
Admission: RE | Disposition: A | Discharge status: Home / Self Care | Payer: Self-pay | Source: Home / Self Care | Attending: Ophthalmology

## 2019-06-16 DIAGNOSIS — F17200 Nicotine dependence, unspecified, uncomplicated: Secondary | ICD-10-CM | POA: Insufficient documentation

## 2019-06-16 DIAGNOSIS — K219 Gastro-esophageal reflux disease without esophagitis: Secondary | ICD-10-CM | POA: Diagnosis not present

## 2019-06-16 DIAGNOSIS — G4730 Sleep apnea, unspecified: Secondary | ICD-10-CM | POA: Diagnosis not present

## 2019-06-16 DIAGNOSIS — I739 Peripheral vascular disease, unspecified: Secondary | ICD-10-CM | POA: Diagnosis not present

## 2019-06-16 DIAGNOSIS — Z7982 Long term (current) use of aspirin: Secondary | ICD-10-CM | POA: Diagnosis not present

## 2019-06-16 DIAGNOSIS — Z79899 Other long term (current) drug therapy: Secondary | ICD-10-CM | POA: Diagnosis not present

## 2019-06-16 DIAGNOSIS — I10 Essential (primary) hypertension: Secondary | ICD-10-CM | POA: Insufficient documentation

## 2019-06-16 DIAGNOSIS — H25811 Combined forms of age-related cataract, right eye: Secondary | ICD-10-CM | POA: Diagnosis not present

## 2019-06-16 DIAGNOSIS — Z9842 Cataract extraction status, left eye: Secondary | ICD-10-CM | POA: Diagnosis not present

## 2019-06-16 HISTORY — PX: CATARACT EXTRACTION W/PHACO: SHX586

## 2019-06-16 SURGERY — PHACOEMULSIFICATION, CATARACT, WITH IOL INSERTION
Anesthesia: Monitor Anesthesia Care | Site: Eye | Laterality: Right

## 2019-06-16 MED ORDER — MIDAZOLAM HCL 2 MG/2ML IJ SOLN
INTRAMUSCULAR | Status: AC
  Filled 2019-06-16: qty 2

## 2019-06-16 MED ORDER — LIDOCAINE HCL 3.5 % OP GEL: 1.0000 "application " | Freq: Once | OPHTHALMIC | Status: DC

## 2019-06-16 MED ORDER — CYCLOPENTOLATE-PHENYLEPHRINE 0.2-1 % OP SOLN
1.0000 [drp] | OPHTHALMIC | Status: DC | PRN
  Administered 2019-06-16 (×2): 1 [drp] via OPHTHALMIC

## 2019-06-16 MED ORDER — LIDOCAINE 3.5 % OP GEL OPTIME - NO CHARGE
OPHTHALMIC | Status: DC | PRN
  Administered 2019-06-16: 11:00:00 1 [drp] via OPHTHALMIC

## 2019-06-16 MED ORDER — LACTATED RINGERS IV SOLN: INTRAVENOUS | Status: DC

## 2019-06-16 MED ORDER — BSS IO SOLN
INTRAOCULAR | Status: DC | PRN
  Administered 2019-06-16: 15 mL via INTRAOCULAR

## 2019-06-16 MED ORDER — PROVISC 10 MG/ML IO SOLN
INTRAOCULAR | Status: DC | PRN
  Administered 2019-06-16: 0.85 mL via INTRAOCULAR

## 2019-06-16 MED ORDER — PROMETHAZINE HCL 25 MG/ML IJ SOLN: 6.2500 mg | INTRAMUSCULAR | Status: DC | PRN

## 2019-06-16 MED ORDER — MIDAZOLAM HCL 2 MG/2ML IJ SOLN: 0.5000 mg | Freq: Once | INTRAMUSCULAR | Status: DC | PRN

## 2019-06-16 MED ORDER — SODIUM HYALURONATE 23 MG/ML IO SOLN
INTRAOCULAR | Status: DC | PRN
  Administered 2019-06-16: 0.6 mL via INTRAOCULAR

## 2019-06-16 MED ORDER — MIDAZOLAM HCL 5 MG/5ML IJ SOLN
INTRAMUSCULAR | Status: DC | PRN
  Administered 2019-06-16: 2 mg via INTRAVENOUS

## 2019-06-16 MED ORDER — HYDROMORPHONE HCL 1 MG/ML IJ SOLN: 0.2500 mg | INTRAMUSCULAR | Status: DC | PRN

## 2019-06-16 MED ORDER — TETRACAINE HCL 0.5 % OP SOLN
1.0000 [drp] | OPHTHALMIC | Status: DC | PRN
  Administered 2019-06-16 (×2): 1 [drp] via OPHTHALMIC

## 2019-06-16 MED ORDER — PHENYLEPHRINE HCL 2.5 % OP SOLN
1.0000 [drp] | OPHTHALMIC | Status: DC | PRN
  Administered 2019-06-16 (×2): 1 [drp] via OPHTHALMIC

## 2019-06-16 MED ORDER — HYDROCODONE-ACETAMINOPHEN 7.5-325 MG PO TABS: 1.0000 | ORAL_TABLET | Freq: Once | ORAL | Status: DC | PRN

## 2019-06-16 MED ORDER — EPINEPHRINE PF 1 MG/ML IJ SOLN
INTRAOCULAR | Status: DC | PRN
  Administered 2019-06-16: 11:00:00 500 mL

## 2019-06-16 MED ORDER — POVIDONE-IODINE 5 % OP SOLN
OPHTHALMIC | Status: DC | PRN
  Administered 2019-06-16: 1 via OPHTHALMIC

## 2019-06-16 MED ORDER — LIDOCAINE HCL (PF) 1 % IJ SOLN
INTRAOCULAR | Status: DC | PRN
  Administered 2019-06-16: 11:00:00 1 mL via OPHTHALMIC

## 2019-06-16 SURGICAL SUPPLY — 13 items

## 2019-06-16 NOTE — Transfer of Care (Signed)
Immediate Anesthesia Transfer of Care Note  Patient: Michelle Patton  Procedure(s) Performed: CATARACT EXTRACTION PHACO AND INTRAOCULAR LENS PLACEMENT ([facility]) (Right Eye)  Patient Location: PACU  Anesthesia Type:MAC  Level of Consciousness: awake, alert  and oriented  Airway & Oxygen Therapy: Patient Spontanous Breathing  Post-op Assessment: Report given to RN, Post -op Vital signs reviewed and stable and Patient moving all extremities X 4  Post vital signs: Reviewed and stable  Last Vitals:  Vitals Value Taken Time  BP    Temp    Pulse    Resp    SpO2      Last Pain:  Vitals:   06/16/19 1002  TempSrc: Oral  PainSc: 0-No pain         Complications: No apparent anesthesia complications

## 2019-06-16 NOTE — Anesthesia Preprocedure Evaluation (Signed)
Anesthesia Evaluation  Patient identified by MRN, date of birth, ID band Patient awake    Reviewed: Allergy & Precautions, NPO status , Patient's Chart, lab work & pertinent test results  History of Anesthesia Complications (+) Family history of anesthesia reaction  Airway Mallampati: II  TM Distance: >3 FB Neck ROM: Full    Dental no notable dental hx. (+) Teeth Intact   Pulmonary sleep apnea , Current SmokerPatient did not abstain from smoking.,    Pulmonary exam normal breath sounds clear to auscultation       Cardiovascular Exercise Tolerance: Good hypertension, Pt. on medications + Peripheral Vascular Disease  Normal cardiovascular examI Rhythm:Regular Rate:Normal  H/o AVR denies o2 use  Smoker  Smoked today    Neuro/Psych Anxiety negative neurological ROS  negative psych ROS   GI/Hepatic Neg liver ROS, GERD  Medicated and Controlled,  Endo/Other  negative endocrine ROS  Renal/GU negative Renal ROS  negative genitourinary   Musculoskeletal  (+) Arthritis , Osteoarthritis,    Abdominal   Peds negative pediatric ROS (+)  Hematology negative hematology ROS (+)   Anesthesia Other Findings   Reproductive/Obstetrics negative OB ROS                             Anesthesia Physical Anesthesia Plan  ASA: III  Anesthesia Plan: MAC   Post-op Pain Management:    Induction: Intravenous  PONV Risk Score and Plan: 1 and TIVA and Treatment may vary due to age or medical condition  Airway Management Planned: Nasal Cannula  Additional Equipment:   Intra-op Plan:   Post-operative Plan:   Informed Consent: I have reviewed the patients History and Physical, chart, labs and discussed the procedure including the risks, benefits and alternatives for the proposed anesthesia with the patient or authorized representative who has indicated his/her understanding and acceptance.     Dental  advisory given  Plan Discussed with: CRNA  Anesthesia Plan Comments: (Plan Full PPE use  Plan MAC d/w pt -WTP with same after Q&A   Recent IOL- wants same as last time )        Anesthesia Quick Evaluation

## 2019-06-16 NOTE — Interval H&P Note (Signed)
History and Physical Interval Note: The H and P was reviewed and updated. The patient was examined.  No changes were found after exam.  The surgical eye was marked.  06/16/2019 10:46 AM  Michelle Patton  has presented today for surgery, with the diagnosis of Nuclear sclerotic cataract - Right eye.  The various methods of treatment have been discussed with the patient and family. After consideration of risks, benefits and other options for treatment, the patient has consented to  Procedure(s) with comments: CATARACT EXTRACTION PHACO AND INTRAOCULAR LENS PLACEMENT ([place]) (Right) - right-office to notify pt to arrive at 12:00pm as a surgical intervention.  The patient's history has been reviewed, patient examined, no change in status, stable for surgery.  I have reviewed the patient's chart and labs.  Questions were answered to the patient's satisfaction.     Baruch Goldmann

## 2019-06-16 NOTE — Discharge Instructions (Signed)
Please discharge patient when stable, will follow up today with Dr. Lasharon Dunivan at the [facility] office immediately following discharge.  Leave shield in place until visit.  All paperwork with discharge instructions will be given at the office.  [facility] Address:  [address]  [place], [place] [address]             Monitored Anesthesia Care, Care After These instructions provide you with information about caring for yourself after your procedure. Your health care provider may also give you more specific instructions. Your treatment has been planned according to current medical practices, but problems sometimes occur. Call your health care provider if you have any problems or questions after your procedure. What can I expect after the procedure? After your procedure, you may:  Feel sleepy for several hours.  Feel clumsy and have poor balance for several hours.  Feel forgetful about what happened after the procedure.  Have poor judgment for several hours.  Feel nauseous or vomit.  Have a sore throat if you had a breathing tube during the procedure. Follow these instructions at home: For at least 24 hours after the procedure:      Have a responsible adult stay with you. It is important to have someone help care for you until you are awake and alert.  Rest as needed.  Do not: ? Participate in activities in which you could fall or become injured. ? Drive. ? Use heavy machinery. ? Drink alcohol. ? Take sleeping pills or medicines that cause drowsiness. ? Make important decisions or sign legal documents. ? Take care of children on your own. Eating and drinking  Follow the diet that is recommended by your health care provider.  If you vomit, drink water, juice, or soup when you can drink without vomiting.  Make sure you have little or no nausea before eating solid foods. General instructions  Take over-the-counter and  prescription medicines only as told by your health care provider.  If you have sleep apnea, surgery and certain medicines can increase your risk for breathing problems. Follow instructions from your health care provider about wearing your sleep device: ? Anytime you are sleeping, including during daytime naps. ? While taking prescription pain medicines, sleeping medicines, or medicines that make you drowsy.  If you smoke, do not smoke without supervision.  Keep all follow-up visits as told by your health care provider. This is important. Contact a health care provider if:  You keep feeling nauseous or you keep vomiting.  You feel light-headed.  You develop a rash.  You have a fever. Get help right away if:  You have trouble breathing. Summary  For several hours after your procedure, you may feel sleepy and have poor judgment.  Have a responsible adult stay with you for at least 24 hours or until you are awake and alert. This information is not intended to replace advice given to you by your health care provider. Make sure you discuss any questions you have with your health care provider. Document Revised: 07/09/2017 Document Reviewed: 08/01/2015 Elsevier Patient Education  2020 Elsevier Inc.  

## 2019-06-16 NOTE — Anesthesia Postprocedure Evaluation (Signed)
Anesthesia Post Note  Patient: Michelle Patton  Procedure(s) Performed: CATARACT EXTRACTION PHACO AND INTRAOCULAR LENS PLACEMENT ([facility]) (Right Eye)  Patient location during evaluation: PACU Anesthesia Type: MAC Level of consciousness: awake and alert Pain management: pain level controlled Vital Signs Assessment: post-procedure vital signs reviewed and stable Respiratory status: spontaneous breathing, nonlabored ventilation, respiratory function stable and patient connected to nasal cannula oxygen Cardiovascular status: stable and blood pressure returned to baseline Postop Assessment: no apparent nausea or vomiting Anesthetic complications: no     Last Vitals:  Vitals:   06/16/19 1002 06/16/19 1107  BP: (!) 103/52 (!) 107/50  Pulse: 82   Resp: (!) 22 18  Temp: (!) 36.3 C (!) 36.1 C  SpO2: 97% 94%    Last Pain:  Vitals:   06/16/19 1107  TempSrc: Oral  PainSc: 0-No pain                 [name]

## 2019-06-16 NOTE — Op Note (Signed)
Date of procedure: 06/16/19  Pre-operative diagnosis:  Visually significant combined form age-related cataract, Right Eye (H25.811)  Post-operative diagnosis:  Visually significant combined form age-related cataract, Right Eye (H25.811)  Procedure: Removal of cataract via phacoemulsification and insertion of intra-ocular lens [name] and [name]  +12.5D into the capsular bag of the Right Eye  Attending surgeon: Gerda Diss. Amelie Caracci, MD, MA  Anesthesia: MAC, Topical [place]  Complications: None  Estimated Blood Loss: <44m (minimal)  Specimens: None  Implants: As above  Indications:  Visually significant age-related cataract, Right Eye  Procedure:  The patient was seen and identified in the pre-operative area. The operative eye was identified and dilated.  The operative eye was marked.  Topical anesthesia was administered to the operative eye.     The patient was then to the operative suite and placed in the supine position.  A timeout was performed confirming the patient, procedure to be performed, and all other relevant information.   The patient's face was prepped and draped in the usual fashion for intra-ocular surgery.  A lid speculum was placed into the operative eye and the surgical microscope moved into place and focused.  A superotemporal paracentesis was created using a 20 gauge paracentesis blade.  Shugarcaine was injected into the anterior chamber.  Viscoelastic was injected into the anterior chamber.  A temporal clear-corneal main wound incision was created using a 2.464mmicrokeratome.  A continuous curvilinear capsulorrhexis was initiated using an irrigating cystitome and completed using capsulorrhexis forceps.  Hydrodissection and hydrodeliniation were performed.  Viscoelastic was injected into the anterior chamber.  A phacoemulsification handpiece and a chopper as a second instrument were used to remove the nucleus and epinucleus. The irrigation/aspiration handpiece was  used to remove any remaining cortical material.   The capsular bag was reinflated with viscoelastic, checked, and found to be intact.  The intraocular lens was inserted into the capsular bag.  The irrigation/aspiration handpiece was used to remove any remaining viscoelastic.  The clear corneal wound and paracentesis wounds were then hydrated and checked with Weck-Cels to be watertight.  The lid-speculum and drape was removed, and the patient's face was cleaned with a wet and dry 4x4.  A clear shield was taped over the eye. The patient was taken to the post-operative care unit in good condition, having tolerated the procedure well.  Post-Op Instructions: [facility] and will receive all other orders and instructions.

## 2019-07-08 ENCOUNTER — Other Ambulatory Visit: Payer: Self-pay | Admitting: Emergency Medicine

## 2019-07-08 ENCOUNTER — Other Ambulatory Visit: Payer: Self-pay | Admitting: Family Medicine

## 2019-07-08 DIAGNOSIS — E785 Hyperlipidemia, unspecified: Secondary | ICD-10-CM

## 2019-07-08 MED ORDER — ROSUVASTATIN CALCIUM 10 MG PO TABS: 10.0000 mg | ORAL_TABLET | Freq: Every day | ORAL | 1 refills | Status: DC

## 2019-07-08 NOTE — Telephone Encounter (Signed)
This patient is requesting this med  Rosuvastatin which I have already refilled. I also scheduled her for a  6 month f/u cause she have not had any blood work done. I added the labs can you look to make sure there is nothing else you would like to add or take off. You also filled her Ambien and she also would like a refill on this one but I told her I would need to ask you.

## 2019-07-09 ENCOUNTER — Other Ambulatory Visit: Payer: Self-pay | Admitting: Emergency Medicine

## 2019-07-09 DIAGNOSIS — E785 Hyperlipidemia, unspecified: Secondary | ICD-10-CM

## 2019-07-10 ENCOUNTER — Telehealth: Payer: Self-pay | Admitting: Emergency Medicine

## 2019-07-10 ENCOUNTER — Other Ambulatory Visit: Payer: Self-pay | Admitting: Family Medicine

## 2019-07-10 DIAGNOSIS — E785 Hyperlipidemia, unspecified: Secondary | ICD-10-CM

## 2019-07-10 LAB — LIPID PANEL
Cholesterol: 230 mg/dL — ABNORMAL HIGH (ref <200)
HDL: 53 mg/dL (ref >=50)
LDL Cholesterol (Calc): 148 mg/dL (calc) — ABNORMAL HIGH
Non-HDL Cholesterol (Calc): 177 mg/dL (calc) — ABNORMAL HIGH (ref <130)
Total CHOL/HDL Ratio: 4.3 (calc) (ref <5.0)
Triglycerides: 155 mg/dL — ABNORMAL HIGH (ref <150)

## 2019-07-10 MED ORDER — ROSUVASTATIN CALCIUM 20 MG PO TABS: 20.0000 mg | ORAL_TABLET | Freq: Every day | ORAL | 1 refills | Status: DC

## 2019-07-10 NOTE — Telephone Encounter (Signed)
Spoke with patient about her lab results and patient stated she is willing to increase her Crestor. Patient was informed to increase to 20 mg now instead the 10 mg. Then recheck her lab work at the end of May so she can take a look at it before she leaves.

## 2019-07-10 NOTE — Telephone Encounter (Signed)
-----   Message from Maryruth Hancock, MD sent at 07/10/2019  1:07 PM EDT ----- Your cholesterol has improved -TC 230, LDL 148, Triglycerides 155-consider an increase to 20mg  of Crestor to optimize the therapy. If you increase the dose ,we should check again in 3 months.  Since I will be leaving, I would be happy for you to check the level the end of May so I can look at the results.

## 2019-07-10 NOTE — Telephone Encounter (Deleted)
Spoke with patient about her lab work and she is on board with increase her Crestor medication. She just picked up her rx would you like her to take two or please advise on what you would like to increase the dose to

## 2019-07-14 ENCOUNTER — Other Ambulatory Visit: Payer: Self-pay | Admitting: Orthopedic Surgery

## 2019-07-14 DIAGNOSIS — M1712 Unilateral primary osteoarthritis, left knee: Secondary | ICD-10-CM

## 2019-07-14 MED ORDER — DICLOFENAC SODIUM 75 MG PO TBEC: 75.0000 mg | DELAYED_RELEASE_TABLET | Freq: Two times a day (BID) | ORAL | 5 refills | Status: DC

## 2019-07-14 NOTE — Telephone Encounter (Signed)
Patient requests refill: diclofenac (VOLTAREN) 75 MG EC tablet  - due to [place], patient is asking for 90-day supply

## 2019-07-16 ENCOUNTER — Encounter: Payer: Self-pay | Admitting: Family Medicine

## 2019-07-16 ENCOUNTER — Other Ambulatory Visit: Payer: Self-pay

## 2019-07-16 ENCOUNTER — Ambulatory Visit: Payer: BC Managed Care – PPO | Admitting: Family Medicine

## 2019-07-16 VITALS — BP 137/77 | HR 92 | Temp 97.6°F | Ht 66.0 in | Wt 237.4 lb

## 2019-07-16 DIAGNOSIS — E785 Hyperlipidemia, unspecified: Secondary | ICD-10-CM

## 2019-07-16 DIAGNOSIS — K219 Gastro-esophageal reflux disease without esophagitis: Secondary | ICD-10-CM | POA: Diagnosis not present

## 2019-07-16 DIAGNOSIS — I10 Essential (primary) hypertension: Secondary | ICD-10-CM | POA: Diagnosis not present

## 2019-07-16 DIAGNOSIS — R5382 Chronic fatigue, unspecified: Secondary | ICD-10-CM

## 2019-07-16 MED ORDER — ZOLPIDEM TARTRATE 5 MG PO TABS: 5.0000 mg | ORAL_TABLET | Freq: Every evening | ORAL | 2 refills | Status: DC | PRN

## 2019-07-16 NOTE — Addendum Note (Signed)
Addended by: Maryruth Hancock on: 07/16/2019 04:55 PM   Modules accepted: Orders

## 2019-07-16 NOTE — Progress Notes (Addendum)
Established Patient Office Visit  Subjective:  Patient ID: Michelle Patton, female    DOB: 08/28/1955  Age: 64 y.o. MRN: [id number] Prescriptions Total Prescriptions: 21   Total Private Pay: 3   Fill Date ID   Written Drug Qty Days Prescriber Rx # Pharmacy Refill   Daily Dose* Pymt Type PMP    07/05/2019  2   07/04/2019  Phenobarbital 30 MG Tablet  90.00  36 [place]   [id number]   Nor ([id number])   0  2.50 LME  Private Pay   [place]  06/05/2019  1   02/26/2019  Clonazepam 0.5 MG Tablet  12.00  30 Cecelia Byars   [id number]   Nor [phone]   1  0.40 LME  Comm Ins   [place]  05/10/2019  2   01/15/2019  Phenobarbital 32.4 MG Tablet  90.00  36 [place]   [id number]   [place] ([id number])   3  2.70 LME  Private Pay   [place]  02/26/2019  1   02/26/2019  Clonazepam 0.5 MG Tablet  12.00  30 Cecelia Byars   [id number]   [place] ([id number])   0  0.40 LME  Comm Ins   [place]  09/12/2018  1   09/09/2018  Clonazepam 1 MG Tablet  90.00  90 Cy But   [id number]   Exp (4317)   0  2.00 LME  Comm Ins   [place]  09/12/2018  1   09/09/2018  Zolpidem Tartrate 10 MG Tablet  90.00  90 Cy But   [id number]   Exp (4317)   0  0.50 LME  Comm Ins   [place]  06/18/2018  1   06/17/2018  Clonazepam 1 MG Tablet  90.00  90 Cy But   [id number]   Exp (4317)   0  2.00 LME  Comm Ins   [place]  05/26/2018  1   05/23/2018  Zolpidem Tartrate 10 MG Tablet  90.00  90 Cy But   [id number]   Exp (4317)   0  0.50 LME  Comm Ins   [place]  03/06/2018  1   10/22/2017  Zolpidem Tartrate 10 MG Tablet  30.00  30 Cy But   [id number]   Exp (4317)   3  0.50 LME  Comm Ins   [place]  02/05/2018  1   10/22/2017  Zolpidem Tartrate 10 MG Tablet  30.00  30 Cy But   [id number]   Exp (4317)   2  0.50 LME  Comm Ins   [place]  01/21/2018  1   01/21/2018  Oxycodone-Acetaminophen 10-325  30.00  7 [place]   [id number]   Nor ([id number])   0  64.29 MME  Private Pay   [place]  01/13/2018  1   10/22/2017  Zolpidem Tartrate 10 MG Tablet  30.00  30 Cy But   [id number]   Exp (4317)   1  0.50 LME  Comm Ins   [place]  01/10/2018  1   01/10/2018  Hydrocodone-Acetamin 5-325 MG   40.00  5 [address] [place]   [id number]   Nor ([id number])   0  40.00 MME  Comm Ins   [place]  12/31/2017  1   10/18/2017  Clonazepam 1 MG Tablet  30.00  30 Cy But   [id number]   Nor ([id number])   2  2.00 LME       CC:  Chief Complaint  Patient presents with  . Follow-up    6 month f/u hyperlipidemia. Patient's crestor  has been increase from 10mg  to 20mg     HPI Adeola Dashnaw presents for hyperlipidemia-increase in crestor to 20mg  HTN-metoprolol BID/Cozaar-stable GERD-acidiphex Arthritis-hip pain-voltaren topical and oral-no injections   Past Medical History:  Diagnosis Date  . Anxiety   . Aortic regurgitation 07/20/2014  . Aortic stenosis, severe 07/20/2014  . Arthritis   . DVT (deep venous thrombosis) ([place])   . Family history of adverse reaction to anesthesia    Reports father deliurm in his 60's with CABG  . GERD (gastroesophageal reflux disease)   . History of pneumonia   . Hyperlipidemia   . Hypertension   . Insomnia, unspecified   . Muscle weakness (generalized)   . Peripheral artery disease ([place])   . 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 Sleep apnea    diagnosed [facility]  . Supravalvular aortic stenosis, congenital - s/p repair during childhood     Past Surgical History:  Procedure Laterality Date  . ABDOMINAL AORTAGRAM  06/24/12  . ABDOMINAL AORTAGRAM N/A 06/24/2012   Procedure: ABDOMINAL Maxcine Ham;  Surgeon: Angelia Mould, MD;  Location: [facility] CATH LAB;  Service: Cardiovascular;  Laterality: N/A;  . AORTIC VALVE REPLACEMENT N/A 10/07/2014   Procedure: REDO AORTIC VALVE REPLACEMENT (AVR);  Surgeon: Rexene Alberts, MD;  Location: [facility];  Service: Open Heart Surgery;  Laterality: N/A;  . ASCENDING AORTIC ROOT REPLACEMENT N/A 10/07/2014   Procedure: ASCENDING AORTIC ROOT REPLACEMENT;  Surgeon:  Rexene Alberts, MD;  Location: [place];  Service: Open Heart Surgery;  Laterality: N/A;  . BIOPSY  09/27/2016   Procedure: BIOPSY;  Surgeon: Daneil Dolin, MD;  [facility];  Service: Endoscopy;;  colon  . BREAST REDUCTION SURGERY Bilateral 01/21/2018   Procedure: BREAST REDUCTION WITH LIPOSUCTION;  Surgeon: Cristine Polio, MD;  Location: [place];  Service: Plastics;  Laterality: Bilateral;  . [place]  . CATARACT EXTRACTION W/PHACO Left 05/30/2019   Procedure: CATARACT EXTRACTION PHACO AND INTRAOCULAR LENS PLACEMENT (IOC) (CDE: 4.94  );  Surgeon: Baruch Goldmann, MD;  Location: [facility];  Service: Ophthalmology;  Laterality: Left;  . CATARACT EXTRACTION W/PHACO Right 06/16/2019   Procedure: CATARACT EXTRACTION PHACO AND INTRAOCULAR LENS PLACEMENT (IOC);  Surgeon: Baruch Goldmann, MD;  Location: [facility];  Service: Ophthalmology;  Laterality: Right;  CDE: 4.64  . CERVICAL FUSION    . CHOLECYSTECTOMY    . COLONOSCOPY WITH PROPOFOL N/A 09/27/2016   Dr. Gala Romney: Diverticulosis, several tubular adenomas removed ranging 4 to 7 mm in size, internal grade 1 hemorrhoids, terminal ileum normal, segmental biopsies negative for microscopic colitis.  Next colonoscopy June 2021  . ESOPHAGOGASTRODUODENOSCOPY (EGD) WITH PROPOFOL N/A 09/27/2016   Dr. Gala Romney: Small hiatal hernia, mild Schatzki ring status post disruption, LA grade a esophagitis  . ILIAC ARTERY STENT Left 12/2007  . KNEE ARTHROSCOPY WITH MEDIAL MENISECTOMY Right 01/10/2018   Procedure: RIGHT KNEE ARTHROSCOPY WITH PARTIAL MEDIAL MENISECTOMY;  Surgeon: Carole Civil, MD;  Location: [facility];  Service: Orthopedics;  Laterality: Right;  . LEFT AND RIGHT HEART CATHETERIZATION WITH CORONARY ANGIOGRAM N/A 07/31/2014   Procedure: LEFT AND RIGHT HEART CATHETERIZATION WITH CORONARY ANGIOGRAM;  Surgeon: Burnell Blanks, MD;  Location: [facility] CATH LAB;  Service: Cardiovascular;  Laterality: N/A;  . MALONEY DILATION N/A  09/27/2016   Procedure:  MALONEY DILATION;  Surgeon: Daneil Dolin, MD;  [facility];  Service: Endoscopy;  Laterality: N/A;  . POLYPECTOMY  09/27/2016   Procedure: POLYPECTOMY;  Surgeon: Daneil Dolin, MD;  [facility];  Service: Endoscopy;;  colon  . ROTATOR CUFF REPAIR Bilateral   . TEE WITHOUT CARDIOVERSION N/A 07/07/2014   Procedure: TRANSESOPHAGEAL ECHOCARDIOGRAM (TEE);  Surgeon: Arnoldo Lenis, MD;  [facility];  Service: Cardiology;  Laterality: N/A;  . TEE WITHOUT CARDIOVERSION N/A 07/20/2014   Procedure: TRANSESOPHAGEAL ECHOCARDIOGRAM (TEE) WITH PROPOFOL;  Surgeon: Arnoldo Lenis, MD;  Location[facility];  Service: Endoscopy;  Laterality: N/A;  . TEE WITHOUT CARDIOVERSION N/A 10/07/2014   Procedure: TRANSESOPHAGEAL ECHOCARDIOGRAM (TEE);  Surgeon: Rexene Alberts, MD;  Location: [place];  Service: Open Heart Surgery;  Laterality: N/A;    Family History  Problem Relation Age of Onset  . Hypertension Mother   . Hypertension Father   . Heart disease Father        before age 67  . Other Father        varicose veins  . COPD Father   . Colon cancer Neg Hx   . Celiac disease Neg Hx   . Inflammatory bowel disease Neg Hx     Social History   Socioeconomic History  . Marital status: Married    Spouse name: Not on file  . Number of children: 0  . Years of education: some coll  . Highest education level: Not on file  Occupational History  . Occupation: call center    Employer: [employer]    Comment: [employer]  Tobacco Use  . Smoking status: Current Every Day Smoker    Packs/day: 0.50    Years: 40.00    Pack years: 20.00    Types: Cigarettes  . Smokeless tobacco: Never Used  Substance and Sexual Activity  . Alcohol use: Yes  . Drug use: No  . Sexual activity: Yes    Birth control/protection: None  Other Topics Concern  . Not on file  Social History Narrative   Patient is single and lives at home with her partner. Patient works at [employer]  center. Some college education. Patient drinks coffee and tea.   Social Determinants of Health   Financial Resource Strain:   . Difficulty of Paying Living Expenses:   Food Insecurity:   . Worried About Charity fundraiser in the Last Year:   . Arboriculturist in the Last Year:   Transportation Needs:   . Film/video editor (Medical):   [name] Lack of Transportation (Non-Medical):   Physical Activity:   . Days of Exercise per Week:   . Minutes of Exercise per Session:   Stress:   . Feeling of Stress :   Social Connections:   . Frequency of Communication with Friends and Family:   . Frequency of Social Gatherings with Friends and Family:   . Attends Religious Services:   . Active Member of Clubs or Organizations:   . Attends Archivist Meetings:   [name] Marital Status:   Intimate Partner Violence:   . Fear of Current or Ex-Partner:   . Emotionally Abused:   [name] Physically Abused:   . Sexually Abused:     Outpatient Medications Prior to Visit  Medication Sig Dispense Refill  . Cholecalciferol (VITAMIN D3) 5000 UNITS TABS Take 5,000 Units by mouth every morning.     . diclofenac (VOLTAREN) 75 MG EC tablet  Take 1 tablet (75 mg total) by mouth 2 (two) times daily. **DO NOT CRUSH** 60 tablet 5  . Flaxseed, Linseed, (FLAXSEED OIL) 1000 MG CAPS Take 1,000 mg by mouth daily.     [name] gabapentin (NEURONTIN) 300 MG capsule Take 2 capsules (600 mg total) by mouth at bedtime. (Patient taking differently: Take 300-600 mg by mouth See admin instructions. Take 300 mg by mouth at bedtime Sunday-Thursday and 600 mg at bedtime Friday and Saturday) 180 capsule 4  . losartan (COZAAR) 25 MG tablet TAKE 1 TABLET BY MOUTH EVERY DAY (Patient taking differently: Take 25 mg by mouth daily. ) 30 tablet 0  . metoprolol tartrate (LOPRESSOR) 25 MG tablet TAKE 1 TABLET TWICE A DAY (Patient taking differently: Take 25 mg by mouth 2 (two) times daily. ) 180 tablet 3  . Multiple Minerals (CALCIUM-MAGNESIUM-ZINC)  TABS Take 1 tablet by mouth daily.    . Multiple Vitamin (MULTIVITAMIN WITH MINERALS) TABS tablet Take 1 tablet by mouth daily.    . RABEprazole (ACIPHEX) 20 MG tablet Take 20 mg by mouth 2 (two) times daily.     . rosuvastatin (CRESTOR) 20 MG tablet Take 1 tablet (20 mg total) by mouth daily. 90 tablet 1  . vitamin B-12 (CYANOCOBALAMIN) 1000 MCG tablet Take 1,000 mcg by mouth daily.    . clonazePAM (KLONOPIN) 0.5 MG tablet Take 1 tablet (0.5 mg total) by mouth 2 (two) times daily as needed for anxiety. (Patient not taking: Reported on 07/16/2019) 12 tablet 3  . furosemide (LASIX) 40 MG tablet Take 1 tablet (40 mg total) by mouth daily as needed for edema. (Patient taking differently: Take 40 mg by mouth daily as needed. ) 90 tablet 3  . zolpidem (AMBIEN) 5 MG tablet Take 1 tablet (5 mg total) by mouth at bedtime as needed for sleep. (Patient not taking: Reported on 07/16/2019) 30 tablet 1   No facility-administered medications prior to visit.    Allergies  Allergen Reactions  . Penicillins Hives    Has patient had a PCN reaction causing immediate rash, facial/tongue/throat swelling, SOB or lightheadedness with hypotension: Yes Has patient had a PCN reaction causing severe rash involving mucus membranes or skin necrosis: No Has patient had a PCN reaction that required hospitalization No Has patient had a PCN reaction occurring within the last 10 years: No If all of the above answers are "NO", then may proceed with Cephalosporin use.  Have taken Keflex before without any adverse reaction.   . Sulfa Antibiotics Nausea And Vomiting    ROS Review of Systems  Respiratory: Negative.   Cardiovascular: Negative.   Gastrointestinal: Negative.   Musculoskeletal: Positive for arthralgias.      Objective:    Physical Exam  Constitutional: She is oriented to person, place, and time. She appears well-developed and well-nourished.  Cardiovascular: Normal rate and regular rhythm.   Pulmonary/Chest: Effort normal and breath sounds normal.  Neurological: She is alert and oriented to person, place, and time.  Psychiatric: She has a normal mood and affect. Her behavior is normal.    BP 137/77 (BP Location: Right Arm, Patient Position: Sitting, Cuff Size: Large)   Pulse 92   Temp 97.6 F (36.4 C) (Temporal)   Ht 5\' 6"  (1.676 m)   Wt 237 lb 6.4 oz (107.7 kg)   SpO2 98%   BMI 38.32 kg/m  Wt Readings from Last 3 Encounters:  07/16/19 237 lb 6.4 oz (107.7 kg)  04/07/19 232 lb 6.4 oz (105.4 kg)  02/26/19  224 lb 8 oz (101.8 kg)     Health Maintenance Due  Topic Date Due  . Hepatitis C Screening  Never done  . HIV Screening  Never done  . TETANUS/TDAP  Never done  . PAP SMEAR-Modifier  Never done  . MAMMOGRAM  Never done    Lab Results  Component Value Date   TSH 2.60 12/13/2018   Lab Results  Component Value Date   WBC 8.2 12/27/2018   HGB 14.6 12/27/2018   HCT 45.2 12/27/2018   MCV 93.4 12/27/2018   PLT 263 12/27/2018   Lab Results  Component Value Date   NA 132 (L) 05/28/2019   K 4.2 05/28/2019   CO2 23 05/28/2019   GLUCOSE 107 (H) 05/28/2019   BUN 17 05/28/2019   CREATININE 0.97 05/28/2019   BILITOT 0.7 10/26/2018   ALKPHOS 48 10/26/2018   AST 21 10/26/2018   ALT 22 10/26/2018   PROT 7.4 10/26/2018   ALBUMIN 4.2 10/26/2018   CALCIUM 9.1 05/28/2019   ANIONGAP 9 05/28/2019   Lab Results  Component Value Date   CHOL 230 (H) 07/09/2019   Lab Results  Component Value Date   HDL 53 07/09/2019   Lab Results  Component Value Date   LDLCALC 148 (H) 07/09/2019   Lab Results  Component Value Date   TRIG 155 (H) 07/09/2019   Lab Results  Component Value Date   CHOLHDL 4.3 07/09/2019   Lab Results  Component Value Date   HGBA1C 5.5 10/05/2014      Assessment & Plan:  1. Essential hypertension Lasix/Lopressor/Cozaar-renal function normal  2. Hyperlipidemia, unspecified hyperlipidemia type Crestor-20mg -pt tolerated well-lipid  panel d/w pt  3. Gastroesophageal reflux disease, unspecified whether esophagitis present aciphex  Follow-up: 6 months-HTN  3/24-pt requested Ambien 5mg -difficulty sleeping since no HRT. Pt previously on 10mg  Ambien. Risk/benefit/side effects d/w pt on Ambien. Understands controlled substance. Ambien 5mg  #30 2 r/f Randale Carvalho Hannah Beat, MD

## 2019-07-16 NOTE — Patient Instructions (Signed)
° ° ° °  If you have lab work done today you will be contacted with your lab results within the next 2 weeks.  If you have not heard from us then please contact us. The fastest way to get your results is to register for My Chart. ° ° °IF you received an x-ray today, you will receive an invoice from [facility]. Please contact [facility] at [phone] with questions or concerns regarding your invoice.  ° °IF you received labwork today, you will receive an invoice from [facility]. Please contact [facility] at [phone] with questions or concerns regarding your invoice.  ° °Our billing staff will not be able to assist you with questions regarding bills from these companies. ° °You will be contacted with the lab results as soon as they are available. The fastest way to get your results is to activate your My Chart account. Instructions are located on the last page of this paperwork. If you have not heard from us regarding the results in 2 weeks, please contact this office. °  ° ° ° °

## 2019-07-23 ENCOUNTER — Telehealth: Payer: Self-pay | Admitting: Radiology

## 2019-07-23 DIAGNOSIS — M1712 Unilateral primary osteoarthritis, left knee: Secondary | ICD-10-CM

## 2019-07-23 MED ORDER — DICLOFENAC SODIUM 75 MG PO TBEC: 75.0000 mg | DELAYED_RELEASE_TABLET | Freq: Two times a day (BID) | ORAL | 1 refills | Status: DC

## 2019-07-23 NOTE — Telephone Encounter (Signed)
[facility].

## 2019-07-28 ENCOUNTER — Other Ambulatory Visit: Payer: Self-pay

## 2019-07-28 DIAGNOSIS — E785 Hyperlipidemia, unspecified: Secondary | ICD-10-CM

## 2019-07-28 MED ORDER — ROSUVASTATIN CALCIUM 20 MG PO TABS: 20.0000 mg | ORAL_TABLET | Freq: Every day | ORAL | 1 refills | Status: DC

## 2019-08-22 ENCOUNTER — Ambulatory Visit: Payer: BC Managed Care – PPO | Attending: Internal Medicine

## 2019-08-22 ENCOUNTER — Other Ambulatory Visit: Payer: Self-pay

## 2019-08-22 DIAGNOSIS — Z20822 Contact with and (suspected) exposure to covid-19: Secondary | ICD-10-CM

## 2019-08-23 LAB — SARS-COV-2, NAA 2 DAY TAT

## 2019-08-23 LAB — NOVEL CORONAVIRUS, NAA: SARS-CoV-2, NAA: NOT DETECTED

## 2019-08-28 ENCOUNTER — Other Ambulatory Visit: Payer: Self-pay

## 2019-08-28 ENCOUNTER — Ambulatory Visit: Payer: BC Managed Care – PPO | Attending: Internal Medicine

## 2019-08-28 DIAGNOSIS — Z20822 Contact with and (suspected) exposure to covid-19: Secondary | ICD-10-CM

## 2019-08-29 LAB — NOVEL CORONAVIRUS, NAA: SARS-CoV-2, NAA: NOT DETECTED

## 2019-08-29 LAB — SARS-COV-2, NAA 2 DAY TAT

## 2019-09-09 ENCOUNTER — Ambulatory Visit (INDEPENDENT_AMBULATORY_CARE_PROVIDER_SITE_OTHER): Payer: BC Managed Care – PPO

## 2019-09-09 ENCOUNTER — Other Ambulatory Visit: Payer: Self-pay

## 2019-09-09 ENCOUNTER — Ambulatory Visit
Admission: EM | Admit: 2019-09-09 | Discharge: 2019-09-09 | Disposition: A | Discharge status: Home / Self Care | Payer: BC Managed Care – PPO | Attending: Emergency Medicine | Admitting: Emergency Medicine

## 2019-09-09 DIAGNOSIS — M79671 Pain in right foot: Secondary | ICD-10-CM

## 2019-09-09 DIAGNOSIS — S92351A Displaced fracture of fifth metatarsal bone, right foot, initial encounter for closed fracture: Secondary | ICD-10-CM

## 2019-09-09 MED ORDER — TRAMADOL HCL 50 MG PO TABS: 50.0000 mg | ORAL_TABLET | Freq: Two times a day (BID) | ORAL | 0 refills | Status: AC

## 2019-09-09 MED ORDER — IBUPROFEN 800 MG PO TABS: 800.0000 mg | ORAL_TABLET | Freq: Three times a day (TID) | ORAL | 0 refills | Status: DC

## 2019-09-09 NOTE — Discharge Instructions (Addendum)
The right foot x-ray is positive for oblique displaced fracture through the fifth metatarsal. Tramadol was prescribed for severe pain Ibuprofen 800 was prescribed for mild and moderate pain Follow-up with orthopedic Return or go to ED for worsening of symptoms

## 2019-09-09 NOTE — ED Provider Notes (Signed)
[facility]    CSN: [id number] Arrival date & time: 09/09/19  1651      History   Chief Complaint Chief Complaint  Patient presents with  . Foot Injury    HPI Michelle Patton is a 64 y.o. female.   Presented to the urgent care with a complaint of right foot pain that started today.  Reports she fell while getting out of her bed this morning.  She localized the pain to the right foot/ankle.  Described the pain as constant and achy, rated at 8 on a scale of 1-10.[name]  Has tried OTC medications without relief.  Her symptoms are made worse with ROM.  She denies similar symptoms in the past.  Denies chills, fever, nausea, vomiting, diarrhea, confusion.  The history is provided by the patient. No language interpreter was used.  Foot Injury   Past Medical History:  Diagnosis Date  . Anxiety   . Aortic regurgitation 07/20/2014  . Aortic stenosis, severe 07/20/2014  . Arthritis   . DVT (deep venous thrombosis) ([place])   . Family history of adverse reaction to anesthesia    Reports father deliurm in his 9's with CABG  . GERD (gastroesophageal reflux disease)   . History of pneumonia   . Hyperlipidemia   . Hypertension   . Insomnia, unspecified   . Muscle weakness (generalized)   . Peripheral artery disease ([place])   . 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 Sleep apnea    diagnosed multiple years ago at [facility]  . Supravalvular aortic stenosis, congenital - s/p repair during childhood     Patient Active Problem List   Diagnosis Date Noted  . Skin lesion 04/11/2019  . Low back pain without sciatica 01/20/2019  . Upper abdominal pain 01/14/2019  . Fatigue 12/09/2018  . Breast cancer screening 12/09/2018  . S/P right knee arthroscopy 01/10/18 01/18/2018  . Chondromalacia, patella, right   .  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 Personal history of colonic polyps 12/28/2016  . Abdominal pain, epigastric 08/10/2016  . Left sided abdominal pain 08/10/2016  . Esophageal dysphagia 08/10/2016  . 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 Supravalvular aortic stenosis, congenital - s/p repair during childhood   . Essential hypertension   . Hyperlipidemia   . GERD (gastroesophageal reflux disease)   . Aortic stenosis, severe 07/20/2014  . Aortic regurgitation 07/20/2014  . Leg cramps 09/26/2012  . Occlusion and stenosis of carotid artery without mention of cerebral infarction 07/10/2012  . Peripheral vascular disease, unspecified ([place]) 06/12/2012  . Carotid artery bruit 06/12/2012  . CONSTIPATION 12/28/2009  . NAUSEA AND VOMITING 12/28/2009  . DIARRHEA 12/28/2009    Past Surgical History:  Procedure Laterality Date  . ABDOMINAL AORTAGRAM  06/24/12  . ABDOMINAL AORTAGRAM N/A 06/24/2012   Procedure: ABDOMINAL Maxcine Ham;  Surgeon: Angelia Mould, MD;  Location: [facility] CATH LAB;  Service: Cardiovascular;  Laterality: N/A;  . AORTIC VALVE REPLACEMENT N/A 10/07/2014   Procedure: REDO AORTIC VALVE REPLACEMENT (AVR);  Surgeon: Rexene Alberts, MD;  Location: [place];  Service: Open Heart Surgery;  Laterality: N/A;  . ASCENDING AORTIC ROOT REPLACEMENT N/A 10/07/2014   Procedure: ASCENDING AORTIC ROOT REPLACEMENT;  Surgeon: Rexene Alberts, MD;  Location: [place];  Service: Open Heart  Surgery;  Laterality: N/A;  . BIOPSY  09/27/2016   Procedure: BIOPSY;  Surgeon: Daneil Dolin, MD;  [facility];  Service: Endoscopy;;  colon  . BREAST REDUCTION SURGERY Bilateral 01/21/2018   Procedure: BREAST REDUCTION WITH LIPOSUCTION;  Surgeon: Cristine Polio, MD;  Location: [place];  Service: Plastics;  Laterality: Bilateral;  . [place]  . CATARACT EXTRACTION W/PHACO Left 05/30/2019   Procedure:  CATARACT EXTRACTION PHACO AND INTRAOCULAR LENS PLACEMENT (IOC) (CDE: 4.94  );  Surgeon: Baruch Goldmann, MD;  Location[facility];  Service: Ophthalmology;  Laterality: Left;  . CATARACT EXTRACTION W/PHACO Right 06/16/2019   Procedure: CATARACT EXTRACTION PHACO AND INTRAOCULAR LENS PLACEMENT (IOC);  Surgeon: Baruch Goldmann, MD;  Location: [facility];  Service: Ophthalmology;  Laterality: Right;  CDE: 4.64  . CERVICAL FUSION    . CHOLECYSTECTOMY    . COLONOSCOPY WITH PROPOFOL N/A 09/27/2016   Dr. Gala Romney: Diverticulosis, several tubular adenomas removed ranging 4 to 7 mm in size, internal grade 1 hemorrhoids, terminal ileum normal, segmental biopsies negative for microscopic colitis.  Next colonoscopy June 2021  . ESOPHAGOGASTRODUODENOSCOPY (EGD) WITH PROPOFOL N/A 09/27/2016   Dr. Gala Romney: Small hiatal hernia, mild Schatzki ring status post disruption, LA grade a esophagitis  . ILIAC ARTERY STENT Left 12/2007  . KNEE ARTHROSCOPY WITH MEDIAL MENISECTOMY Right 01/10/2018   Procedure: RIGHT KNEE ARTHROSCOPY WITH PARTIAL MEDIAL MENISECTOMY;  Surgeon: Carole Civil, MD;  [facility];  Service: Orthopedics;  Laterality: Right;  . LEFT AND RIGHT HEART CATHETERIZATION WITH CORONARY ANGIOGRAM N/A 07/31/2014   Procedure: LEFT AND RIGHT HEART CATHETERIZATION WITH CORONARY ANGIOGRAM;  Surgeon: Burnell Blanks, MD;  Location: [facility] CATH LAB;  Service: Cardiovascular;  Laterality: N/A;  . Venia Minks DILATION N/A 09/27/2016   Procedure: Venia Minks DILATION;  Surgeon: Daneil Dolin, MD;  [facility];  Service: Endoscopy;  Laterality: N/A;  . POLYPECTOMY  09/27/2016   Procedure: POLYPECTOMY;  Surgeon: Daneil Dolin, MD;  [facility];  Service: Endoscopy;;  colon  . ROTATOR CUFF REPAIR Bilateral   . TEE WITHOUT CARDIOVERSION N/A 07/07/2014   Procedure: TRANSESOPHAGEAL ECHOCARDIOGRAM (TEE);  Surgeon: Arnoldo Lenis, MD;  [facility];  Service: Cardiology;  Laterality: N/A;  . TEE WITHOUT  CARDIOVERSION N/A 07/20/2014   Procedure: TRANSESOPHAGEAL ECHOCARDIOGRAM (TEE) WITH PROPOFOL;  Surgeon: Arnoldo Lenis, MD;  [facility];  Service: Endoscopy;  Laterality: N/A;  . TEE WITHOUT CARDIOVERSION N/A 10/07/2014   Procedure: TRANSESOPHAGEAL ECHOCARDIOGRAM (TEE);  Surgeon: Rexene Alberts, MD;  Location: [place];  Service: Open Heart Surgery;  Laterality: N/A;    OB History   No obstetric history on file.      Home Medications    Prior to Admission medications   Medication Sig Start Date End Date Taking? Authorizing Provider  Cholecalciferol (VITAMIN D3) 5000 UNITS TABS Take 5,000 Units by mouth every morning.     [provider]  clonazePAM (KLONOPIN) 0.5 MG tablet Take 1 tablet (0.5 mg total) by mouth 2 (two) times daily as needed for anxiety. Patient not taking: Reported on 07/16/2019 01/20/19   Marcial Pacas, MD  diclofenac (VOLTAREN) 75 MG EC tablet Take 1 tablet (75 mg total) by mouth 2 (two) times daily. **DO NOT CRUSH** 07/23/19   Carole Civil, MD  Flaxseed, Linseed, (FLAXSEED OIL) 1000 MG CAPS Take 1,000 mg by mouth daily.     [provider]  furosemide (LASIX) 40 MG  tablet Take 1 tablet (40 mg total) by mouth daily as needed for edema. Patient taking differently: Take 40 mg by mouth daily as needed.  10/23/17 01/23/19  Arnoldo Lenis, MD  gabapentin (NEURONTIN) 300 MG capsule Take 2 capsules (600 mg total) by mouth at bedtime. Patient taking differently: Take 300-600 mg by mouth See admin instructions. Take 300 mg by mouth at bedtime Sunday-Thursday and 600 mg at bedtime Friday and Saturday 04/29/19   Marcial Pacas, MD  ibuprofen (ADVIL) 800 MG tablet Take 1 tablet (800 mg total) by mouth 3 (three) times daily. 09/09/19   Lanaiya Lantry, Darrelyn Hillock, FNP  losartan (COZAAR) 25 MG tablet TAKE 1 TABLET BY MOUTH EVERY DAY Patient taking differently: Take 25 mg by mouth daily.  02/17/19   Arnoldo Lenis, MD  metoprolol tartrate (LOPRESSOR) 25 MG tablet TAKE 1  TABLET TWICE A DAY Patient taking differently: Take 25 mg by mouth 2 (two) times daily.  04/09/19   Arnoldo Lenis, MD  Multiple Minerals (CALCIUM-MAGNESIUM-ZINC) TABS Take 1 tablet by mouth daily.    [provider]  Multiple Vitamin (MULTIVITAMIN WITH MINERALS) TABS tablet Take 1 tablet by mouth daily.    [provider]  RABEprazole (ACIPHEX) 20 MG tablet Take 20 mg by mouth 2 (two) times daily.     [provider]  rosuvastatin (CRESTOR) 20 MG tablet Take 1 tablet (20 mg total) by mouth daily. 07/28/19   Corum, Rex Kras, MD  traMADol (ULTRAM) 50 MG tablet Take 1 tablet (50 mg total) by mouth 2 (two) times daily for 3 days. 09/09/19 09/12/19  Stella Bortle, Darrelyn Hillock, FNP  vitamin B-12 (CYANOCOBALAMIN) 1000 MCG tablet Take 1,000 mcg by mouth daily.    [provider]  zolpidem (AMBIEN) 5 MG tablet Take 1 tablet (5 mg total) by mouth at bedtime as needed for sleep. 07/16/19   Corum, Rex Kras, MD    Family History Family History  Problem Relation Age of Onset  . Hypertension Mother   . Hypertension Father   . Heart disease Father        before age 71  . Other Father        varicose veins  . COPD Father   . Colon cancer Neg Hx   . Celiac disease Neg Hx   . Inflammatory bowel disease Neg Hx     Social History Social History   Tobacco Use  . Smoking status: Current Every Day Smoker    Packs/day: 0.50    Years: 40.00    Pack years: 20.00    Types: Cigarettes  . Smokeless tobacco: Never Used  Substance Use Topics  . Alcohol use: Yes  . Drug use: No     Allergies   Penicillins and Sulfa antibiotics   Review of Systems Review of Systems  Constitutional: Negative.   Respiratory: Negative.   Cardiovascular: Negative.   Musculoskeletal: Positive for arthralgias and joint swelling.  All other systems reviewed and are negative.    Physical Exam Triage Vital Signs ED Triage Vitals  Enc Vitals Group     BP 09/09/19 1707 137/83     Pulse Rate  09/09/19 1707 84     Resp 09/09/19 1707 18     Temp 09/09/19 1707 98.5 F (36.9 C)     Temp src --      SpO2 09/09/19 1707 96 %     Weight --      Height --      Head Circumference --  Peak Flow --      Pain Score 09/09/19 1705 8     Pain Loc --      Pain Edu? --      Excl. in [place]? --    No data found.  Updated Vital Signs BP 137/83   Pulse 84   Temp 98.5 F (36.9 C)   Resp 18   SpO2 96%   Visual Acuity Right Eye Distance:   Left Eye Distance:   Bilateral Distance:    Right Eye Near:   Left Eye Near:    Bilateral Near:     Physical Exam Vitals and nursing note reviewed.  Constitutional:      General: She is not in acute distress.    Appearance: Normal appearance. She is normal weight. She is not ill-appearing, toxic-appearing or diaphoretic.  Cardiovascular:     Rate and Rhythm: Normal rate and regular rhythm.     Pulses: Normal pulses.     Heart sounds: Normal heart sounds. No murmur. No friction rub. No gallop.   Pulmonary:     Effort: Pulmonary effort is normal. No respiratory distress.     Breath sounds: Normal breath sounds. No stridor. No wheezing, rhonchi or rales.  Chest:     Chest wall: No tenderness.  Musculoskeletal:        General: Swelling and tenderness present.     Right foot: Swelling and tenderness present.     Left foot: Normal.     Comments: Patient is able to bear weight and ambulate with pain.  Osteoblastoma, ecchymosis, erythema, lesion, ulcer or break in skin integrity.  The right foot is without any obvious asymmetry when compared to the left foot.  Range of motion with pain.  Neurovascular status intact  Neurological:     Mental Status: She is alert.      UC Treatments / Results  Labs (all labs ordered are listed, but only abnormal results are displayed) Labs Reviewed - No data to display  EKG   Radiology DG Foot Complete Right  Result Date: 09/09/2019 CLINICAL DATA:  Fall.  Pain along lateral foot EXAM: RIGHT FOOT  COMPLETE - 3+ VIEW COMPARISON:  None. FINDINGS: Oblique fracture noted through the right 5th metatarsal. Minimal displacement. No subluxation or dislocation. Overlying soft tissue swelling. Early osteoarthritis in the 1st MTP joint. IMPRESSION: Oblique minimally displaced fracture through the right 5th metatarsal. Electronically Signed   By: Rolm Baptise M.D.   On: 09/09/2019 17:35    Procedures Procedures (including critical care time)  Medications Ordered in UC Medications - No data to display  Initial Impression / Assessment and Plan / UC Course  I have reviewed the triage vital signs and the nursing notes.  Pertinent labs & imaging results that were available during my care of the patient were reviewed by me and considered in my medical decision making (see chart for details).    Patient is stable at discharge.  Right foot x-ray is positive for oblique displaced fracture through the fifth metatarsal.  I have reviewed the x-ray myself and the radiologist interpretation.  I am in agreement with the radiologist interpretation.  Tramadol and ibuprofen were prescribed for pain relief.  Follow-up with orthopedic.  Postop shoe was applied in office.  Final Clinical Impressions(s) / UC Diagnoses   Final diagnoses:  Right foot pain  Closed displaced fracture of fifth metatarsal bone of right foot, initial encounter     Discharge Instructions     The right foot  x-ray is positive for oblique displaced fracture through the fifth metatarsal. Tramadol was prescribed for severe pain Ibuprofen 800 was prescribed for mild and moderate pain Follow-up with orthopedic Return or go to ED for worsening of symptoms    ED Prescriptions    Medication Sig Dispense Auth. Provider   traMADol (ULTRAM) 50 MG tablet Take 1 tablet (50 mg total) by mouth 2 (two) times daily for 3 days. 6 tablet Diesha Rostad S, FNP   ibuprofen (ADVIL) 800 MG tablet Take 1 tablet (800 mg total) by mouth 3 (three)  times daily. 30 tablet Blannie Shedlock, Darrelyn Hillock, FNP     I have reviewed the PDMP during this encounter.   Emerson Monte, [place] 09/09/19 1747

## 2019-09-09 NOTE — ED Triage Notes (Signed)
Pt fell getting out of bed this am , fas right foot and ankle pain

## 2019-09-10 ENCOUNTER — Encounter: Payer: Self-pay | Admitting: Orthopedic Surgery

## 2019-09-10 ENCOUNTER — Other Ambulatory Visit: Payer: Self-pay

## 2019-09-10 ENCOUNTER — Ambulatory Visit: Payer: BC Managed Care – PPO | Admitting: Orthopedic Surgery

## 2019-09-10 VITALS — BP 129/73 | HR 75 | Ht 66.0 in | Wt 237.0 lb

## 2019-09-10 DIAGNOSIS — M5410 Radiculopathy, site unspecified: Secondary | ICD-10-CM

## 2019-09-10 DIAGNOSIS — M48061 Spinal stenosis, lumbar region without neurogenic claudication: Secondary | ICD-10-CM | POA: Diagnosis not present

## 2019-09-10 DIAGNOSIS — S92354A Nondisplaced fracture of fifth metatarsal bone, right foot, initial encounter for closed fracture: Secondary | ICD-10-CM

## 2019-09-10 NOTE — Progress Notes (Signed)
NEW PROBLEM//OFFICE VISIT  Chief Complaint  Patient presents with  . Foot Injury    right foot  09/09/19    64 year old female 2 problems problem #1 yesterday she fell out of bed and injured her right foot went to urgent care she has a right fifth metatarsal fracture she was placed in a postop shoe and it does not feel good  She has a second problem she has a 47-month history of pain in her left lower back and buttock radiating down to her left foot.  She had an MRI in October 2020 which shows she has severe spinal stenosis and lumbar disc disease  She does note some weakness and difficulty walking in the left leg   Review of Systems  Musculoskeletal: Positive for back pain.  Neurological: Positive for weakness.     Past Medical History:  Diagnosis Date  . Anxiety   . Aortic regurgitation 07/20/2014  . Aortic stenosis, severe 07/20/2014  . Arthritis   . DVT (deep venous thrombosis) ([place])   . Family history of adverse reaction to anesthesia    Reports father deliurm in his 66's with CABG  . GERD (gastroesophageal reflux disease)   . History of pneumonia   . Hyperlipidemia   . Hypertension   . Insomnia, unspecified   . Muscle weakness (generalized)   . Peripheral artery disease ([place])   . 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 Sleep apnea    diagnosed multiple years ago at [facility]  . Supravalvular aortic stenosis, congenital - s/p repair during childhood     Past Surgical History:  Procedure Laterality Date  . ABDOMINAL AORTAGRAM  06/24/12  . ABDOMINAL AORTAGRAM N/A 06/24/2012   Procedure: ABDOMINAL Maxcine Ham;  Surgeon: Angelia Mould, MD;  Location: [facility] CATH LAB;  Service: Cardiovascular;  Laterality: N/A;  . AORTIC VALVE REPLACEMENT N/A 10/07/2014   Procedure: REDO AORTIC VALVE REPLACEMENT  (AVR);  Surgeon: Rexene Alberts, MD;  Location: [place];  Service: Open Heart Surgery;  Laterality: N/A;  . ASCENDING AORTIC ROOT REPLACEMENT N/A 10/07/2014   Procedure: ASCENDING AORTIC ROOT REPLACEMENT;  Surgeon: Rexene Alberts, MD;  Location: [place];  Service: Open Heart Surgery;  Laterality: N/A;  . BIOPSY  09/27/2016   Procedure: BIOPSY;  Surgeon: Daneil Dolin, MD;  [facility];  Service: Endoscopy;;  colon  . BREAST REDUCTION SURGERY Bilateral 01/21/2018   Procedure: BREAST REDUCTION WITH LIPOSUCTION;  Surgeon: Cristine Polio, MD;  Location: [place];  Service: Plastics;  Laterality: Bilateral;  . [place]  . CATARACT EXTRACTION W/PHACO Left 05/30/2019   Procedure: CATARACT EXTRACTION PHACO AND INTRAOCULAR LENS PLACEMENT (IOC) (CDE: 4.94  );  Surgeon: Baruch Goldmann, MD;  [facility];  Service: Ophthalmology;  Laterality: Left;  . CATARACT EXTRACTION W/PHACO Right 06/16/2019   Procedure: CATARACT EXTRACTION PHACO AND INTRAOCULAR LENS PLACEMENT (IOC);  Surgeon: Baruch Goldmann, MD;  [facility];  Service: Ophthalmology;  Laterality: Right;  CDE: 4.64  . CERVICAL FUSION    . CHOLECYSTECTOMY    . COLONOSCOPY WITH PROPOFOL N/A 09/27/2016   Dr. Gala Romney: Diverticulosis, several tubular adenomas removed ranging 4 to 7 mm in size, internal grade 1 hemorrhoids, terminal ileum normal, segmental biopsies negative for microscopic colitis.  Next colonoscopy June 2021  . ESOPHAGOGASTRODUODENOSCOPY (EGD) WITH  PROPOFOL N/A 09/27/2016   Dr. Gala Romney: Small hiatal hernia, mild Schatzki ring status post disruption, LA grade a esophagitis  . ILIAC ARTERY STENT Left 12/2007  . KNEE ARTHROSCOPY WITH MEDIAL MENISECTOMY Right 01/10/2018   Procedure: RIGHT KNEE ARTHROSCOPY WITH PARTIAL MEDIAL MENISECTOMY;  Surgeon: Carole Civil, MD;  [facility];  Service: Orthopedics;  Laterality: Right;  . LEFT AND RIGHT HEART CATHETERIZATION WITH CORONARY ANGIOGRAM N/A  07/31/2014   Procedure: LEFT AND RIGHT HEART CATHETERIZATION WITH CORONARY ANGIOGRAM;  Surgeon: Burnell Blanks, MD;  Location: [facility] C[facility] LAB;  Service: Cardiovascular;  Laterality: N/A;  . Venia Minks DILATION N/A 09/27/2016   Procedure: Venia Minks DILATION;  Surgeon: Daneil Dolin, MD;  [facility];  Service: Endoscopy;  Laterality: N/A;  . POLYPECTOMY  09/27/2016   Procedure: POLYPECTOMY;  Surgeon: Daneil Dolin, MD;  [facility];  Service: Endoscopy;;  colon  . ROTATOR CUFF REPAIR Bilateral   . TEE WITHOUT CARDIOVERSION N/A 07/07/2014   Procedure: TRANSESOPHAGEAL ECHOCARDIOGRAM (TEE);  Surgeon: Arnoldo Lenis, MD;  [facility];  Service: Cardiology;  Laterality: N/A;  . TEE WITHOUT CARDIOVERSION N/A 07/20/2014   Procedure: TRANSESOPHAGEAL ECHOCARDIOGRAM (TEE) WITH PROPOFOL;  Surgeon: Arnoldo Lenis, MD;  [facility];  Service: Endoscopy;  Laterality: N/A;  . TEE WITHOUT CARDIOVERSION N/A 10/07/2014   Procedure: TRANSESOPHAGEAL ECHOCARDIOGRAM (TEE);  Surgeon: Rexene Alberts, MD;  Location: [place];  Service: Open Heart Surgery;  Laterality: N/A;    Family History  Problem Relation Age of Onset  . Hypertension Mother   . Hypertension Father   . Heart disease Father        before age 30  . Other Father        varicose veins  . COPD Father   . Colon cancer Neg Hx   . Celiac disease Neg Hx   . Inflammatory bowel disease Neg Hx    Social History   Tobacco Use  . Smoking status: Current Every Day Smoker    Packs/day: 0.50    Years: 40.00    Pack years: 20.00    Types: Cigarettes  . Smokeless tobacco: Never Used  Substance Use Topics  . Alcohol use: Yes  . Drug use: No    Allergies  Allergen Reactions  . Penicillins Hives    Has patient had a PCN reaction causing immediate rash, facial/tongue/throat swelling, SOB or lightheadedness with hypotension: Yes Has patient had a PCN reaction causing severe rash involving mucus membranes or skin  necrosis: No Has patient had a PCN reaction that required hospitalization No Has patient had a PCN reaction occurring within the last 10 years: No If all of the above answers are "NO", then may proceed with Cephalosporin use.  Have taken Keflex before without any adverse reaction.   . Sulfa Antibiotics Nausea And Vomiting    Current Meds  Medication Sig  . Cholecalciferol (VITAMIN D3) 5000 UNITS TABS Take 5,000 Units by mouth every morning.   . clonazePAM (KLONOPIN) 0.5 MG tablet Take 1 tablet (0.5 mg total) by mouth 2 (two) times daily as needed for anxiety.  . diclofenac (VOLTAREN) 75 MG EC tablet Take 1 tablet (75 mg total) by mouth 2 (two) times daily. **DO NOT CRUSH**  . Flaxseed, Linseed, (FLAXSEED OIL) 1000 MG CAPS Take 1,000 mg by mouth daily.   [name] gabapentin (NEURONTIN) 300 MG capsule Take 2 capsules (600 mg total) by mouth at bedtime. (Patient taking differently: Take 300-600 mg by  mouth See admin instructions. Take 300 mg by mouth at bedtime Sunday-Thursday and 600 mg at bedtime Friday and Saturday)  . ibuprofen (ADVIL) 800 MG tablet Take 1 tablet (800 mg total) by mouth 3 (three) times daily.  [name] losartan (COZAAR) 25 MG tablet TAKE 1 TABLET BY MOUTH EVERY DAY (Patient taking differently: Take 25 mg by mouth daily. )  . metoprolol tartrate (LOPRESSOR) 25 MG tablet TAKE 1 TABLET TWICE A DAY (Patient taking differently: Take 25 mg by mouth 2 (two) times daily. )  . Multiple Minerals (CALCIUM-MAGNESIUM-ZINC) TABS Take 1 tablet by mouth daily.  . Multiple Vitamin (MULTIVITAMIN WITH MINERALS) TABS tablet Take 1 tablet by mouth daily.  . RABEprazole (ACIPHEX) 20 MG tablet Take 20 mg by mouth 2 (two) times daily.   . rosuvastatin (CRESTOR) 20 MG tablet Take 1 tablet (20 mg total) by mouth daily.  . traMADol (ULTRAM) 50 MG tablet Take 1 tablet (50 mg total) by mouth 2 (two) times daily for 3 days.  . vitamin B-12 (CYANOCOBALAMIN) 1000 MCG tablet Take 1,000 mcg by mouth daily.  [name] zolpidem  (AMBIEN) 5 MG tablet Take 1 tablet (5 mg total) by mouth at bedtime as needed for sleep.    BP 129/73   Pulse 75   Ht 5\' 6"  (1.676 m)   Wt 237 lb (107.5 kg)   BMI 38.25 kg/m   Physical Exam Constitutional:      General: She is not in acute distress.    Appearance: She is well-developed.  Cardiovascular:     Comments: No peripheral edema Skin:    General: Skin is warm and dry.  Neurological:     Mental Status: She is alert and oriented to person, place, and time.     Sensory: No sensory deficit.     Coordination: Coordination normal.     Gait: Gait abnormal.     Deep Tendon Reflexes: Reflexes are normal and symmetric.     Ortho Exam  Right foot is tender and swollen she has tenderness over the fifth metatarsal some swelling in that area neurovascular exam is intact ankle motion is normal muscle tone is normal  Back is tender.  Midline and left side.  Straight leg raise is mildly positive.  Motor function is normal.  Sensory exam normal.  No pathologic reflexes.    MEDICAL DECISION MAKING  A.  Encounter Diagnoses  Name Primary?  . Closed nondisplaced fracture of fifth metatarsal bone of right foot, initial encounter Yes  . Radicular pain of right lower extremity   . Spinal stenosis of lumbar region, unspecified whether neurogenic claudication present     B. DATA ANALYSED:    IMAGING: Independent interpretation of images:  1. MRI was done previously October 2020 and it does show significant lumbar stenosis spondylosis and disc disease especially around L3  2.  X-rays done at urgent care nondisplaced fifth meta tarsal fracture distal aspect  Orders: Epidural series lumbar spine  Outside records reviewed: no  C. MANAGEMENT  CAM Walker short weight-bear as tolerated right foot fracture  Epidural series injection for this problem with radiculopathy  No orders of the defined types were placed in this encounter.     Arther Abbott, MD  09/10/2019 9:57  AM

## 2019-09-10 NOTE — Patient Instructions (Signed)
Fracture right foot fifth metatarsal wear boot for 6 weeks x-ray in 6 weeks  Buttock pain with left leg radicular pain with your MRI findings I recommend an epidural steroid injection we will make arrangements with [facility] imaging they will call you  You will get the injections we will check on that when you come back you can get 3 injections 1 every 2 weeks depending on the response  And thank you for that cheese steak

## 2019-09-15 ENCOUNTER — Encounter: Payer: Self-pay | Admitting: Internal Medicine

## 2019-09-17 ENCOUNTER — Telehealth: Payer: Self-pay | Admitting: Orthopedic Surgery

## 2019-09-17 NOTE — Telephone Encounter (Signed)
I called back to patient and relayed. States she has been speaking with their office. States she has also spoken with her [place], and that a nurse reviewer has escalated the request and said 'that may be why we received a call from [facility]'.  Michelle Patton she is having increased pain due to having the boot on her fractured right foot, which is causing her to walk 'lopsided' and that she can't walk, lay, or sit comfortably; therefore, she is waiting on the [facility] process. If any other advice from Dr Aline Brochure, please let her know. Aware of her follow up appointment in July for her foot.

## 2019-09-17 NOTE — Telephone Encounter (Signed)
We received a call from [place] at [place].  Her phone number is [phone].  She stated that she had received a call from this patient asking why her epidural steroid injections have not been approved.  Michelle Patton was asking who we refer to for the injections and if referral has been sent. I told her that I would have to have someone from the clinic answer her questions or maybe call the patient to clear up any confusion.     She asks that we call her also to see if there is anything she can do on her end to help out.  I told her that I would relay that message  Would you call Michelle Patton regarding referral for [facility] please?  Thanks so much

## 2019-09-17 NOTE — Telephone Encounter (Signed)
We sent her to [facility] to Dr Ernestina Patches for the [facility], they are likely working on the approval.  The number there is [phone] will you provide her the number, please ?

## 2019-09-19 ENCOUNTER — Telehealth: Payer: Self-pay | Admitting: Physical Medicine and Rehabilitation

## 2019-09-19 NOTE — Telephone Encounter (Signed)
Mandy from [employer] called.   Following up on why the patient has not been scheduled from her referral. Requesting a call back to discuss  Call back: [phone]

## 2019-09-19 NOTE — Telephone Encounter (Signed)
Derrick with [facility] called wanting to make Tonisha aware they would be sending some info regarding the pt and they had received the policy #.

## 2019-09-23 ENCOUNTER — Telehealth: Payer: Self-pay | Admitting: Orthopedic Surgery

## 2019-09-23 NOTE — Telephone Encounter (Signed)
Call received from [facility], nurse with [place], ph# [phone] - states she has spoken with patient and that Dr Romona Curls office has not yet called patient regarding referral for pain injection. Referral note indicates pending authorization. Please advise.

## 2019-09-23 NOTE — Telephone Encounter (Signed)
Called pt insurance and lvm to have them to call me back.

## 2019-09-24 ENCOUNTER — Encounter (INDEPENDENT_AMBULATORY_CARE_PROVIDER_SITE_OTHER): Payer: Self-pay | Admitting: Internal Medicine

## 2019-09-24 ENCOUNTER — Other Ambulatory Visit: Payer: Self-pay

## 2019-09-24 ENCOUNTER — Ambulatory Visit (INDEPENDENT_AMBULATORY_CARE_PROVIDER_SITE_OTHER): Payer: BC Managed Care – PPO | Admitting: Internal Medicine

## 2019-09-24 VITALS — BP 135/75 | HR 84 | Temp 97.0°F | Ht 66.0 in | Wt 228.6 lb

## 2019-09-24 DIAGNOSIS — Z6836 Body mass index (BMI) 36.0-36.9, adult: Secondary | ICD-10-CM

## 2019-09-24 DIAGNOSIS — G4700 Insomnia, unspecified: Secondary | ICD-10-CM

## 2019-09-24 DIAGNOSIS — Z13820 Encounter for screening for osteoporosis: Secondary | ICD-10-CM

## 2019-09-24 DIAGNOSIS — I10 Essential (primary) hypertension: Secondary | ICD-10-CM

## 2019-09-24 DIAGNOSIS — Z131 Encounter for screening for diabetes mellitus: Secondary | ICD-10-CM

## 2019-09-24 DIAGNOSIS — K219 Gastro-esophageal reflux disease without esophagitis: Secondary | ICD-10-CM

## 2019-09-24 DIAGNOSIS — E559 Vitamin D deficiency, unspecified: Secondary | ICD-10-CM

## 2019-09-24 DIAGNOSIS — E782 Mixed hyperlipidemia: Secondary | ICD-10-CM

## 2019-09-24 DIAGNOSIS — N951 Menopausal and female climacteric states: Secondary | ICD-10-CM

## 2019-09-24 MED ORDER — PROGESTERONE 200 MG PO CAPS: 200.0000 mg | ORAL_CAPSULE | Freq: Every day | ORAL | 3 refills | Status: DC

## 2019-09-24 NOTE — Telephone Encounter (Signed)
Ok thanks 

## 2019-09-24 NOTE — Progress Notes (Signed)
Metrics: Intervention Frequency ACO  Documented Smoking Status Yearly  Screened one or more times in 24 months  Cessation Counseling or  Active cessation medication Past 24 months  Past 24 months   Guideline developer: UpToDate (See UpToDate for funding source) Date Released: 2014       Wellness Office Visit  Subjective:  Patient ID: Michelle Patton, female    DOB: Sep 07, 1955  Age: 64 y.o. MRN: [id number]  CC: This pleasant 64 year old lady comes to our practice to establish care.  Her previous physician has left the area. HPI  She has a history of hypertension, sleep apnea, previous history of severe aortic stenosis requiring aortic valve replacement. She also has hyperlipidemia and is on statin therapy. She wants to become healthier.  She tells me that her best weight has been around 140 pounds and she wants to become healthier from this perspective. Her current issue has been hot flashes and insomnia.  This began when her Provera was discontinued by her previous physician.  She has tried many things over-the-counter but unsuccessful. Past Medical History:  Diagnosis Date  . Anxiety   . Aortic regurgitation 07/20/2014  . Aortic stenosis, severe 07/20/2014  . Arthritis   . DVT (deep venous thrombosis) ([place])   . Family history of adverse reaction to anesthesia    Reports father deliurm in his 25's with CABG  . GERD (gastroesophageal reflux disease)   . History of pneumonia   . Hyperlipidemia   . Hypertension   . Insomnia, unspecified   . Muscle weakness (generalized)   . Peripheral artery disease ([place])   . 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 Sleep apnea    diagnosed multiple years ago at [facility]  . Supravalvular aortic stenosis, congenital - s/p repair during childhood    Past Surgical History:    Procedure Laterality Date  . ABDOMINAL AORTAGRAM  06/24/12  . ABDOMINAL AORTAGRAM N/A 06/24/2012   Procedure: ABDOMINAL Maxcine Ham;  Surgeon: Angelia Mould, MD;  Location: [facility] CATH LAB;  Service: Cardiovascular;  Laterality: N/A;  . AORTIC VALVE REPLACEMENT N/A 10/07/2014   Procedure: REDO AORTIC VALVE REPLACEMENT (AVR);  Surgeon: Rexene Alberts, MD;  Location: [place];  Service: Open Heart Surgery;  Laterality: N/A;  . ASCENDING AORTIC ROOT REPLACEMENT N/A 10/07/2014   Procedure: ASCENDING AORTIC ROOT REPLACEMENT;  Surgeon: Rexene Alberts, MD;  Location: [place];  Service: Open Heart Surgery;  Laterality: N/A;  . BIOPSY  09/27/2016   Procedure: BIOPSY;  Surgeon: Daneil Dolin, MD;  [facility];  Service: Endoscopy;;  colon  . BREAST REDUCTION SURGERY Bilateral 01/21/2018   Procedure: BREAST REDUCTION WITH LIPOSUCTION;  Surgeon: Cristine Polio, MD;  Location: [place];  Service: Plastics;  Laterality: Bilateral;  . [place]  . CATARACT EXTRACTION W/PHACO Left 05/30/2019   Procedure: CATARACT EXTRACTION PHACO AND INTRAOCULAR LENS PLACEMENT (IOC) (CDE: 4.94  );  Surgeon: Baruch Goldmann, MD;  Location: [facility];  Service: Ophthalmology;  Laterality: Left;  . CATARACT EXTRACTION W/PHACO Right 06/16/2019   Procedure: CATARACT EXTRACTION PHACO AND INTRAOCULAR LENS PLACEMENT (IOC);  Surgeon: Baruch Goldmann, MD;  Location: [facility];  Service: Ophthalmology;  Laterality: Right;  CDE: 4.64  . CERVICAL FUSION    . CHOLECYSTECTOMY    . COLONOSCOPY WITH PROPOFOL N/A 09/27/2016  Dr. Gala Romney: Diverticulosis, several tubular adenomas removed ranging 4 to 7 mm in size, internal grade 1 hemorrhoids, terminal ileum normal, segmental biopsies negative for microscopic colitis.  Next colonoscopy June 2021  . ESOPHAGOGASTRODUODENOSCOPY (EGD) WITH PROPOFOL N/A 09/27/2016   Dr. Gala Romney: Small hiatal hernia, mild Schatzki ring status post disruption, LA grade a esophagitis  . ILIAC  ARTERY STENT Left 12/2007  . KNEE ARTHROSCOPY WITH MEDIAL MENISECTOMY Right 01/10/2018   Procedure: RIGHT KNEE ARTHROSCOPY WITH PARTIAL MEDIAL MENISECTOMY;  Surgeon: Carole Civil, MD;  Location: [facility];  Service: Orthopedics;  Laterality: Right;  . LEFT AND RIGHT HEART CATHETERIZATION WITH CORONARY ANGIOGRAM N/A 07/31/2014   Procedure: LEFT AND RIGHT HEART CATHETERIZATION WITH CORONARY ANGIOGRAM;  Surgeon: Burnell Blanks, MD;  Location: [facility] CATH LAB;  Service: Cardiovascular;  Laterality: N/A;  . Venia Minks DILATION N/A 09/27/2016   Procedure: Venia Minks DILATION;  Surgeon: Daneil Dolin, MD;  [facility];  Service: Endoscopy;  Laterality: N/A;  . POLYPECTOMY  09/27/2016   Procedure: POLYPECTOMY;  Surgeon: Daneil Dolin, MD;  [facility];  Service: Endoscopy;;  colon  . ROTATOR CUFF REPAIR Bilateral   . TEE WITHOUT CARDIOVERSION N/A 07/07/2014   Procedure: TRANSESOPHAGEAL ECHOCARDIOGRAM (TEE);  Surgeon: Arnoldo Lenis, MD;  [facility];  Service: Cardiology;  Laterality: N/A;  . TEE WITHOUT CARDIOVERSION N/A 07/20/2014   Procedure: TRANSESOPHAGEAL ECHOCARDIOGRAM (TEE) WITH PROPOFOL;  Surgeon: Arnoldo Lenis, MD;  Location: [facility];  Service: Endoscopy;  Laterality: N/A;  . TEE WITHOUT CARDIOVERSION N/A 10/07/2014   Procedure: TRANSESOPHAGEAL ECHOCARDIOGRAM (TEE);  Surgeon: Rexene Alberts, MD;  Location: [place];  Service: Open Heart Surgery;  Laterality: N/A;     Family History  Problem Relation Age of Onset  . Hypertension Mother   . Hypertension Father   . Heart disease Father        before age 38  . Other Father        varicose veins  . COPD Father   . Colon cancer Neg Hx   . Celiac disease Neg Hx   . Inflammatory bowel disease Neg Hx     Social History   Social History Narrative   Patient is married   for 5 years . Patient works at [employer].. Some college education-did not Writer.Originally from [place].    Social History   Tobacco Use  . Smoking status: Current Every Day Smoker    Packs/day: 0.50    Years: 40.00    Pack years: 20.00    Types: Cigarettes  . Smokeless tobacco: Never Used  Substance Use Topics  . Alcohol use: Yes    Alcohol/week: 2.0 standard drinks    Types: 2 Cans of beer per week    Current Meds  Medication Sig  . Cholecalciferol (VITAMIN D3) 5000 UNITS TABS Take 5,000 Units by mouth every morning.   . clonazePAM (KLONOPIN) 0.5 MG tablet Take 1 tablet (0.5 mg total) by mouth 2 (two) times daily as needed for anxiety.  . diclofenac (VOLTAREN) 75 MG EC tablet Take 1 tablet (75 mg total) by mouth 2 (two) times daily. **DO NOT CRUSH**  . Flaxseed, Linseed, (FLAXSEED OIL) 1000 MG CAPS Take 1,000 mg by mouth daily.   [name] ibuprofen (ADVIL) 800 MG tablet Take 1 tablet (800 mg total) by mouth 3 (three) times daily.  [name] losartan (COZAAR) 25 MG tablet TAKE 1 TABLET BY MOUTH EVERY DAY (Patient taking differently: Take 25 mg by  mouth daily. )  . metoprolol tartrate (LOPRESSOR) 25 MG tablet TAKE 1 TABLET TWICE A DAY (Patient taking differently: Take 25 mg by mouth 2 (two) times daily. )  . Multiple Minerals (CALCIUM-MAGNESIUM-ZINC) TABS Take 1 tablet by mouth daily.  . Multiple Vitamin (MULTIVITAMIN WITH MINERALS) TABS tablet Take 1 tablet by mouth daily.  . RABEprazole (ACIPHEX) 20 MG tablet Take 20 mg by mouth 2 (two) times daily.   . rosuvastatin (CRESTOR) 20 MG tablet Take 1 tablet (20 mg total) by mouth daily.  . vitamin B-12 (CYANOCOBALAMIN) 1000 MCG tablet Take 1,000 mcg by mouth daily.  [name] zolpidem (AMBIEN) 5 MG tablet Take 1 tablet (5 mg total) by mouth at bedtime as needed for sleep.  . [DISCONTINUED] gabapentin (NEURONTIN) 300 MG capsule Take 2 capsules (600 mg total) by mouth at bedtime. (Patient taking differently: Take 300-600 mg by mouth See admin instructions. Take 300 mg by mouth at bedtime Sunday-Thursday and 600 mg at bedtime Friday and Saturday)      Depression  screen [facility] 2/9 07/16/2019 04/07/2019 12/09/2018 11/04/2014  Decreased Interest 0 0 0 0  Down, Depressed, Hopeless 0 0 0 1  PHQ - 2 Score 0 0 0 1  Some recent data might be hidden     Objective:   Today's Vitals: BP 135/75 (BP Location: Left Arm, Patient Position: Sitting, Cuff Size: Normal)   Pulse 84   Temp (!) 97 F (36.1 C) (Temporal)   Ht 5\' 6"  (1.676 m)   Wt 228 lb 9.6 oz (103.7 kg)   SpO2 97%   BMI 36.90 kg/m  Vitals with BMI 09/24/2019 09/10/2019 09/09/2019  Height 5\' 6"  5\' 6"  -  Weight 228 lbs 10 oz 237 lbs -  BMI [id number] -  Systolic [id number]  Diastolic 75 73 83  Pulse 84 75 84     Physical Exam   She looks systemically well but remains obese.  She has managed to lose 9 pounds since she was seen in the system about 2 weeks ago.    Assessment   1. Essential hypertension   2. Mixed hyperlipidemia   3. Gastroesophageal reflux disease, unspecified whether esophagitis present   4. Insomnia, unspecified type   5. Hot flashes due to menopause   6. Class 2 severe obesity due to excess calories with serious comorbidity and body mass index (BMI) of 36.0 to 36.9 in adult ([place])   7. Screening for diabetes mellitus   8. Vitamin D deficiency disease   9. Osteoporosis screening       Tests ordered Orders Placed This Encounter  Procedures  . DG Bone Density  . Cardio IQ Insulin Resistance Panel with Score  . Cardio IQ Adv Lipid and Inflamm Pnl  . COMPLETE METABOLIC PANEL WITH GFR  . Hemoglobin A1c  . T3, free  . T4  . TSH  . VITAMIN D 25 Hydroxy (Vit-D Deficiency, Fractures)  . CBC     Plan: 1. Blood work is ordered. 2. I will arrange for her to have bone density scan which she is due for since she is postmenopausal. 3. I am going to prescribe for her progesterone for her hot flashes and menopausal symptoms.  This will likely also help her insomnia. 4. I also recommended melatonin from [email or web address]. 5. For the time being, she will continue all  medications for hypertension, hyperlipidemia. 6. I will follow up with her in about 3 weeks time and review all her blood work and further  recommendations.   Meds ordered this encounter  Medications  . progesterone (PROMETRIUM) 200 MG capsule    Sig: Take 1 capsule (200 mg total) by mouth daily.    Dispense:  30 capsule    Refill:  3    Geryl Dohn Luther Parody, MD

## 2019-09-27 LAB — COMPLETE METABOLIC PANEL WITH GFR
AG Ratio: 1.6 (calc) (ref 1.0–2.5)
ALT: 27 U/L (ref 6–29)
AST: 24 U/L (ref 10–35)
Albumin: 4.8 g/dL (ref 3.6–5.1)
Alkaline phosphatase (APISO): 70 U/L (ref 37–153)
BUN/Creatinine Ratio: 19 (calc) (ref 6–22)
BUN: 21 mg/dL (ref 7–25)
CO2: 28 mmol/L (ref 20–32)
Calcium: 10.3 mg/dL (ref 8.6–10.4)
Chloride: 98 mmol/L (ref 98–110)
Creat: 1.1 mg/dL — ABNORMAL HIGH (ref 0.50–0.99)
GFR, Est African American: 62 mL/min/{1.73_m2} (ref >=60)
GFR, Est Non African American: 54 mL/min/{1.73_m2} — ABNORMAL LOW (ref >=60)
Globulin: 3 g/dL (calc) (ref 1.9–3.7)
Glucose, Bld: 101 mg/dL — ABNORMAL HIGH (ref 65–99)
Potassium: 3.9 mmol/L (ref 3.5–5.3)
Sodium: 138 mmol/L (ref 135–146)
Total Bilirubin: 0.5 mg/dL (ref 0.2–1.2)
Total Protein: 7.8 g/dL (ref 6.1–8.1)

## 2019-09-27 LAB — CARDIO IQ INSULIN RESISTANCE PANEL WITH SCORE
C-PEPTIDE, LC/MS/MS: 3.22 ng/mL — ABNORMAL HIGH (ref 0.68–2.16)
INSULIN, INTACT, LC/MS/MS: 14 u[IU]/mL (ref <=16)
Insulin Resistance Score: 87 — ABNORMAL HIGH (ref <=66)

## 2019-09-27 LAB — HEMOGLOBIN A1C
Hgb A1c MFr Bld: 5.5 % of total Hgb (ref <5.7)
Mean Plasma Glucose: 111 (calc)
eAG (mmol/L): 6.2 (calc)

## 2019-09-27 LAB — TSH: TSH: 3.23 mIU/L (ref 0.40–4.50)

## 2019-09-27 LAB — CARDIO IQ ADV LIPID AND INFLAMM PNL
Apolipoprotein B: 129 mg/dL — ABNORMAL HIGH (ref <90)
Cholesterol: 235 mg/dL — ABNORMAL HIGH (ref <200)
HDL: 52 mg/dL (ref >49)
LDL Cholesterol (Calc): 144 mg/dL (calc) — ABNORMAL HIGH (ref <100)
LDL Large: 7354 nmol/L (ref >6729)
LDL Medium: 600 nmol/L — ABNORMAL HIGH (ref <215)
LDL Particle Number: 2660 nmol/L — ABNORMAL HIGH (ref <1138)
LDL Peak Size: 215.6 Angstrom — ABNORMAL LOW (ref >222.9)
LDL Small: 580 nmol/L — ABNORMAL HIGH (ref <142)
Lipoprotein (a): 24 nmol/L (ref <75)
Non-HDL Cholesterol (Calc): 183 mg/dL (calc) — ABNORMAL HIGH (ref <130)
PLAC: 148 nmol/min/mL — ABNORMAL HIGH (ref <124)
Total CHOL/HDL Ratio: 4.5 calc — ABNORMAL HIGH (ref <3.6)
Triglycerides: 252 mg/dL — ABNORMAL HIGH (ref <150)
hs-CRP: 8.7 mg/L — ABNORMAL HIGH (ref <1.0)

## 2019-09-27 LAB — CBC
HCT: 44.4 % (ref 35.0–45.0)
Hemoglobin: 15 g/dL (ref 11.7–15.5)
MCH: 30.2 pg (ref 27.0–33.0)
MCHC: 33.8 g/dL (ref 32.0–36.0)
MCV: 89.5 fL (ref 80.0–100.0)
MPV: 10 fL (ref 7.5–12.5)
Platelets: 279 10*3/uL (ref 140–400)
RBC: 4.96 10*6/uL (ref 3.80–5.10)
RDW: 12.6 % (ref 11.0–15.0)
WBC: 7.8 10*3/uL (ref 3.8–10.8)

## 2019-09-27 LAB — VITAMIN D 25 HYDROXY (VIT D DEFICIENCY, FRACTURES): Vit D, 25-Hydroxy: 39 ng/mL (ref 30–100)

## 2019-09-27 LAB — T4: T4, Total: 6.4 ug/dL (ref 5.1–11.9)

## 2019-09-27 LAB — T3, FREE: T3, Free: 2.6 pg/mL (ref 2.3–4.2)

## 2019-10-01 ENCOUNTER — Telehealth: Payer: Self-pay | Admitting: Radiology

## 2019-10-01 DIAGNOSIS — M48061 Spinal stenosis, lumbar region without neurogenic claudication: Secondary | ICD-10-CM

## 2019-10-01 NOTE — Telephone Encounter (Signed)
Patient will have to have some conservative treatment first before her [employer] will cover the epidural steroid injections She must have at least 4 weeks of meds and physical therapy  I will call her.

## 2019-10-01 NOTE — Telephone Encounter (Signed)
Left message for her to call me back regarding her denial of the injections, will talk to her and see if she wants to go for the physical therapy then try again

## 2019-10-02 NOTE — Telephone Encounter (Signed)
Michelle Patton talked to her, and she wants therapy, I have put in order

## 2019-10-14 ENCOUNTER — Other Ambulatory Visit: Payer: Self-pay

## 2019-10-14 ENCOUNTER — Ambulatory Visit (INDEPENDENT_AMBULATORY_CARE_PROVIDER_SITE_OTHER): Payer: BC Managed Care – PPO | Admitting: Internal Medicine

## 2019-10-14 ENCOUNTER — Encounter (INDEPENDENT_AMBULATORY_CARE_PROVIDER_SITE_OTHER): Payer: Self-pay | Admitting: Internal Medicine

## 2019-10-14 VITALS — BP 130/80 | HR 89 | Temp 96.2°F | Ht 66.0 in | Wt 221.6 lb

## 2019-10-14 DIAGNOSIS — E782 Mixed hyperlipidemia: Secondary | ICD-10-CM

## 2019-10-14 DIAGNOSIS — N951 Menopausal and female climacteric states: Secondary | ICD-10-CM

## 2019-10-14 DIAGNOSIS — E669 Obesity, unspecified: Secondary | ICD-10-CM | POA: Diagnosis not present

## 2019-10-14 DIAGNOSIS — G5601 Carpal tunnel syndrome, right upper limb: Secondary | ICD-10-CM

## 2019-10-14 DIAGNOSIS — E559 Vitamin D deficiency, unspecified: Secondary | ICD-10-CM

## 2019-10-14 NOTE — Patient Instructions (Signed)
[facility] Dietary Recommendations for Weight Loss What to Avoid . Avoid added sugars o Often added sugar can be found in processed foods such as many condiments, dry cereals, cakes, cookies, chips, crisps, crackers, candies, sweetened drinks, etc.  o Read labels and AVOID/DECREASE use of foods with the following in their ingredient list: Sugar, fructose, high fructose corn syrup, sucrose, glucose, maltose, dextrose, molasses, cane sugar, brown sugar, any type of syrup, agave nectar, etc.   . Avoid snacking in between meals . Avoid foods made with flour o If you are going to eat food made with flour, choose those made with whole-grains; and, minimize your consumption as much as is tolerable . Avoid processed foods o These foods are generally stocked in the middle of the grocery store. Focus on shopping on the perimeter of the grocery.  . Avoid Meat  o We recommend following a plant-based diet at [facility]. Thus, we recommend avoiding meat as a general rule. Consider eating beans, legumes, eggs, and/or dairy products for regular protein sources o If you plan on eating meat limit to 4 ounces of meat at a time and choose lean options such as Fish, chicken, turkey. Avoid red meat intake such as pork and/or steak What to Include . Vegetables o GREEN LEAFY VEGETABLES: Kale, spinach, mustard greens, collard greens, cabbage, broccoli, etc. o OTHER: Asparagus, cauliflower, eggplant, carrots, peas, Brussel sprouts, tomatoes, bell peppers, zucchini, beets, cucumbers, etc. . Grains, seeds, and legumes o Beans: kidney beans, black eyed peas, garbanzo beans, black beans, pinto beans, etc. o Whole, unrefined grains: brown rice, barley, bulgur, oatmeal, etc. . Healthy fats  o Avoid highly processed fats such as vegetable oil o Examples of healthy fats: avocado, olives, virgin olive oil, dark chocolate (?72% Cocoa), nuts (peanuts, almonds, walnuts, cashews, pecans, etc.) . None to Low  Intake of Animal Sources of Protein o Meat sources: chicken, turkey, salmon, tuna. Limit to 4 ounces of meat at one time. o Consider limiting dairy sources, but when choosing dairy focus on: PLAIN Greek yogurt, cottage cheese, high-protein milk . Fruit o Choose berries  When to Eat . Intermittent Fasting: o Choosing not to eat for a specific time period, but DO FOCUS ON HYDRATION when fasting o Multiple Techniques: - Time Restricted Eating: eat 3 meals in a day, each meal lasting no more than 60 minutes, no snacks between meals - 16-18 hour fast: fast for 16 to 18 hours up to 7 days a week. Often suggested to start with 2-3 nonconsecutive days per week.  . Remember the time you sleep is counted as fasting.  . Examples of eating schedule: Fast from 7:00pm-11:00am. Eat between 11:00am-7:00pm.  - 24-hour fast: fast for 24 hours up to every other day. Often suggested to start with 1 day per week . Remember the time you sleep is counted as fasting . Examples of eating schedule:  o Eating day: eat 2-3 meals on your eating day. If doing 2 meals, each meal should last no more than 90 minutes. If doing 3 meals, each meal should last no more than 60 minutes. Finish last meal by 7:00pm. o Fasting day: Fast until 7:00pm.  o IF YOU FEEL UNWELL FOR ANY REASON/IN ANY WAY WHEN FASTING, STOP FASTING BY EATING A NUTRITIOUS SNACK OR LIGHT MEAL o ALWAYS FOCUS ON HYDRATION DURING FASTS - Acceptable Hydration sources: water, broths, tea/coffee (black tea/coffee is best but using a small amount of whole-fat dairy products in coffee/tea is acceptable).  -   Poor Hydration Sources: anything with sugar or artificial sweeteners added to it  These recommendations have been developed for patients that are actively receiving medical care from either Dr. Isabelle Matt or Sarah Gray, DNP, NP-C at [facility]. These recommendations are developed for patients with specific medical conditions and are not meant to be  distributed or used by others that are not actively receiving care from either provider listed above at [facility]. It is not appropriate to participate in the above eating plans without proper medical supervision.   Reference: Fung, J. The obesity code. [place]: [place]; 2016.   

## 2019-10-14 NOTE — Progress Notes (Signed)
Metrics: Intervention Frequency ACO  Documented Smoking Status Yearly  Screened one or more times in 24 months  Cessation Counseling or  Active cessation medication Past 24 months  Past 24 months   Guideline developer: UpToDate (See UpToDate for funding source) Date Released: 2014       Wellness Office Visit  Subjective:  Patient ID: Michelle Patton, female    DOB: July 06, 1955  Age: 64 y.o. MRN: [id number]  CC: This lady comes in to discuss all her blood work and further recommendations. HPI  She was having hot flashes and progesterone has helped her to some degree but not completely.  She was also having trouble with insomnia and she has been taking melatonin from [facility] [employer] and she is up to 4 mg every night which seems to help her tremendously with sleep. She is now following [place]om as a diet and she has managed to lose weight. I discussed all her results with her which shows suboptimal vitamin D levels.  Free T3 levels which are also suboptimal.  Although her hemoglobin A1c is completely normal, she does have insulin resistance based on the insulin resistance score.  Also her lipid IQ panel shows that she is at high risk for coronary artery disease. She also complained of tingling and numbness in the right hand in the distribution of median nerve. Past Medical History:  Diagnosis Date  . Anxiety   . Aortic regurgitation 07/20/2014  . Aortic stenosis, severe 07/20/2014  . Arthritis   . DVT (deep venous thrombosis) ([place])   . Family history of adverse reaction to anesthesia    Reports father deliurm in his 30's with CABG  . GERD (gastroesophageal reflux disease)   . History of pneumonia   . Hyperlipidemia   . Hypertension   . Insomnia, unspecified   . Muscle weakness (generalized)   . Peripheral artery disease ([place])   . 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 Sleep apnea    diagnosed multiple years ago at [facility]  . Supravalvular aortic stenosis, congenital - s/p repair during childhood    Past Surgical History:  Procedure Laterality Date  . ABDOMINAL AORTAGRAM  06/24/12  . ABDOMINAL AORTAGRAM N/A 06/24/2012   Procedure: ABDOMINAL Maxcine Ham;  Surgeon: Angelia Mould, MD;  Location: [facility] CATH LAB;  Service: Cardiovascular;  Laterality: N/A;  . AORTIC VALVE REPLACEMENT N/A 10/07/2014   Procedure: REDO AORTIC VALVE REPLACEMENT (AVR);  Surgeon: Rexene Alberts, MD;  Location: [place];  Service: Open Heart Surgery;  Laterality: N/A;  . ASCENDING AORTIC ROOT REPLACEMENT N/A 10/07/2014   Procedure: ASCENDING AORTIC ROOT REPLACEMENT;  Surgeon: Rexene Alberts, MD;  Location: [place];  Service: Open Heart Surgery;  Laterality: N/A;  . BIOPSY  09/27/2016   Procedure: BIOPSY;  Surgeon: Daneil Dolin, MD;  [facility];  Service: Endoscopy;;  colon  . BREAST REDUCTION SURGERY Bilateral 01/21/2018   Procedure: BREAST REDUCTION WITH LIPOSUCTION;  Surgeon: Cristine Polio, MD;  Location: [place];  Service: Plastics;  Laterality: Bilateral;  . [place]  . CATARACT EXTRACTION W/PHACO Left 05/30/2019   Procedure: CATARACT EXTRACTION PHACO AND INTRAOCULAR LENS PLACEMENT (IOC) (CDE: 4.94  );  Surgeon: Baruch Goldmann, MD;  [facility];  Service: Ophthalmology;  Laterality: Left;  . CATARACT EXTRACTION W/PHACO Right 06/16/2019  Procedure: CATARACT EXTRACTION PHACO AND INTRAOCULAR LENS PLACEMENT (IOC);  Surgeon: Baruch Goldmann, MD;  [facility];  Service: Ophthalmology;  Laterality: Right;  CDE: 4.64  . CERVICAL FUSION    . CHOLECYSTECTOMY    . COLONOSCOPY WITH PROPOFOL N/A 09/27/2016   Dr. Gala Romney: Diverticulosis, several tubular adenomas removed ranging 4 to 7 mm in size, internal grade 1 hemorrhoids, terminal ileum normal, segmental biopsies negative for  microscopic colitis.  Next colonoscopy June 2021  . ESOPHAGOGASTRODUODENOSCOPY (EGD) WITH PROPOFOL N/A 09/27/2016   Dr. Gala Romney: Small hiatal hernia, mild Schatzki ring status post disruption, LA grade a esophagitis  . ILIAC ARTERY STENT Left 12/2007  . KNEE ARTHROSCOPY WITH MEDIAL MENISECTOMY Right 01/10/2018   Procedure: RIGHT KNEE ARTHROSCOPY WITH PARTIAL MEDIAL MENISECTOMY;  Surgeon: Carole Civil, MD;  [facility];  Service: Orthopedics;  Laterality: Right;  . LEFT AND RIGHT HEART CATHETERIZATION WITH CORONARY ANGIOGRAM N/A 07/31/2014   Procedure: LEFT AND RIGHT HEART CATHETERIZATION WITH CORONARY ANGIOGRAM;  Surgeon: Burnell Blanks, MD;  Location: [facility] CATH LAB;  Service: Cardiovascular;  Laterality: N/A;  . Venia Minks DILATION N/A 09/27/2016   Procedure: Venia Minks DILATION;  Surgeon: Daneil Dolin, MD;  [facility];  Service: Endoscopy;  Laterality: N/A;  . POLYPECTOMY  09/27/2016   Procedure: POLYPECTOMY;  Surgeon: Daneil Dolin, MD;  [facility];  Service: Endoscopy;;  colon  . ROTATOR CUFF REPAIR Bilateral   . TEE WITHOUT CARDIOVERSION N/A 07/07/2014   Procedure: TRANSESOPHAGEAL ECHOCARDIOGRAM (TEE);  Surgeon: Arnoldo Lenis, MD;  [facility];  Service: Cardiology;  Laterality: N/A;  . TEE WITHOUT CARDIOVERSION N/A 07/20/2014   Procedure: TRANSESOPHAGEAL ECHOCARDIOGRAM (TEE) WITH PROPOFOL;  Surgeon: Arnoldo Lenis, MD;  [facility];  Service: Endoscopy;  Laterality: N/A;  . TEE WITHOUT CARDIOVERSION N/A 10/07/2014   Procedure: TRANSESOPHAGEAL ECHOCARDIOGRAM (TEE);  Surgeon: Rexene Alberts, MD;  Location: [place];  Service: Open Heart Surgery;  Laterality: N/A;     Family History  Problem Relation Age of Onset  . Hypertension Mother   . Hypertension Father   . Heart disease Father        before age 62  . Other Father        varicose veins  . COPD Father   . Colon cancer Neg Hx   . Celiac disease Neg Hx   . Inflammatory bowel  disease Neg Hx     Social History   Social History Narrative   Patient is married   for 5 years . 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   Social History   Tobacco Use  . Smoking status: Current Every Day Smoker    Packs/day: 0.50    Years: 40.00    Pack years: 20.00    Types: Cigarettes  . Smokeless tobacco: Never Used  Substance Use Topics  . Alcohol use: Yes    Alcohol/week: 2.0 standard drinks    Types: 2 Cans of beer per week    Current Meds  Medication Sig  . Cholecalciferol (VITAMIN D3) 5000 UNITS TABS Take 5,000 Units by mouth every morning.   . clonazePAM (KLONOPIN) 0.5 MG tablet Take 1 tablet (0.5 mg total) by mouth 2 (two) times daily as needed for anxiety.  . diclofenac (VOLTAREN) 75 MG EC tablet Take 1 tablet (75 mg total) by mouth 2 (two) times daily. **DO NOT CRUSH**  . Flaxseed, Linseed, (FLAXSEED OIL) 1000 MG CAPS  Take 1,000 mg by mouth daily.   [name] ibuprofen (ADVIL) 800 MG tablet Take 1 tablet (800 mg total) by mouth 3 (three) times daily.  [name] losartan (COZAAR) 25 MG tablet TAKE 1 TABLET BY MOUTH EVERY DAY (Patient taking differently: Take 25 mg by mouth daily. )  . Melatonin 3 MG SUBL Place 4 mg under the tongue at bedtime.  . metoprolol tartrate (LOPRESSOR) 25 MG tablet TAKE 1 TABLET TWICE A DAY (Patient taking differently: Take 25 mg by mouth 2 (two) times daily. )  . Multiple Minerals (CALCIUM-MAGNESIUM-ZINC) TABS Take 1 tablet by mouth daily.  . Multiple Vitamin (MULTIVITAMIN WITH MINERALS) TABS tablet Take 1 tablet by mouth daily.  . progesterone (PROMETRIUM) 200 MG capsule Take 1 capsule (200 mg total) by mouth daily.  . RABEprazole (ACIPHEX) 20 MG tablet Take 20 mg by mouth 2 (two) times daily.   . rosuvastatin (CRESTOR) 20 MG tablet Take 1 tablet (20 mg total) by mouth daily.  . vitamin B-12 (CYANOCOBALAMIN) 1000 MCG tablet Take 1,000 mcg by mouth daily.  [name] zolpidem (AMBIEN) 5  MG tablet Take 1 tablet (5 mg total) by mouth at bedtime as needed for sleep.      Depression screen [facility] 2/9 07/16/2019 04/07/2019 12/09/2018 11/04/2014  Decreased Interest 0 0 0 0  Down, Depressed, Hopeless 0 0 0 1  PHQ - 2 Score 0 0 0 1  Some recent data might be hidden     Objective:   Today's Vitals: BP 130/80 (BP Location: Left Arm, Patient Position: Sitting, Cuff Size: Normal)   Pulse 89   Temp (!) 96.2 F (35.7 C) (Temporal)   Ht 5\' 6"  (1.676 m)   Wt 221 lb 9.6 oz (100.5 kg)   SpO2 96%   BMI 35.77 kg/m  Vitals with BMI 10/14/2019 09/24/2019 09/10/2019  Height 5\' 6"  5\' 6"  5\' 6"   Weight 221 lbs 10 oz 228 lbs 10 oz 237 lbs  BMI 35.78 99.35 70.17  Systolic 793 903 009  Diastolic 80 75 73  Pulse 89 84 75     Physical Exam  She looks systemically well.  She has lost 7 pounds since the last visit.  Blood pressure is normal.  She is alert and orientated without any focal neurological signs.  She remains obese. Examination of her right hand confirms signs consistent with carpal tunnel syndrome on the right side.     Assessment   1. Carpal tunnel syndrome of right wrist   2. Mixed hyperlipidemia   3. Hot flashes due to menopause   4. Obesity (BMI 30-39.9)   5. Vitamin D deficiency disease       Tests ordered No orders of the defined types were placed in this encounter.    Plan: 1. As far as her carpal tunnel syndrome is concerned, she is due to see Dr. Aline Brochure, orthopedics for her right lower leg and she will address this with him at that time. 2. I discussed her cardio IQ lipid panel in detail which puts her at high risk and we discussed nutrition since she is also insulin resistance.  I introduced the concept of intermittent fasting combined with a more plant-based diet and I explained the rationale for this. 3. Her hot flashes could be improved further and I have told her to increase the progesterone dose to 400 mg at night. 4. Her vitamin D3 dose needs to be  increased to 10,000 units daily to optimize vitamin D levels and she will do this.  5. I will see her in about 3 weeks time to see how she is doing and we will discuss further regarding her overall health, obesity and also I will address the suboptimal free T3 levels at that time. 6. Today I spent 30 minutes with this patient discussing all of the above.   No orders of the defined types were placed in this encounter.   Doree Albee, MD

## 2019-10-15 ENCOUNTER — Encounter: Payer: Self-pay | Admitting: Internal Medicine

## 2019-10-21 DIAGNOSIS — S92354D Nondisplaced fracture of fifth metatarsal bone, right foot, subsequent encounter for fracture with routine healing: Secondary | ICD-10-CM | POA: Insufficient documentation

## 2019-10-23 ENCOUNTER — Ambulatory Visit: Payer: BC Managed Care – PPO | Admitting: Orthopedic Surgery

## 2019-10-23 ENCOUNTER — Other Ambulatory Visit: Payer: Self-pay

## 2019-10-23 ENCOUNTER — Ambulatory Visit (INDEPENDENT_AMBULATORY_CARE_PROVIDER_SITE_OTHER): Payer: BC Managed Care – PPO

## 2019-10-23 ENCOUNTER — Encounter: Payer: Self-pay | Admitting: Orthopedic Surgery

## 2019-10-23 DIAGNOSIS — S92354D Nondisplaced fracture of fifth metatarsal bone, right foot, subsequent encounter for fracture with routine healing: Secondary | ICD-10-CM | POA: Diagnosis not present

## 2019-10-23 DIAGNOSIS — G5601 Carpal tunnel syndrome, right upper limb: Secondary | ICD-10-CM

## 2019-10-23 NOTE — Patient Instructions (Signed)
2 more weeks in the boot   Then take it off and [place] as needed

## 2019-10-23 NOTE — Progress Notes (Signed)
Chief Complaint  Patient presents with  . Foot Injury    09/09/19 right foot injury feeling better     Right foot fracture nondisplaced fifth metatarsal  She notes the foot is feeling better  The x-ray shows the fracture line is improving there is callus starting to form around the fracture however the fracture line is still visible  Clinical exam reveals tenderness only with tapping no palpable tenderness  Encounter Diagnosis  Name Primary?  . Closed nondisplaced fracture of fifth metatarsal bone of right foot with routine healing, subsequent encounter 09/09/19  Yes     6 weeks x-ray today  New problem in the global period of the fracture  Chief complaint numbness and tingling right hand primarily thumb index and long finger  Review of systems denies weakness in the right hand  Exam shows tenderness over the carpal tunnel normal color capillary refill and temperature of the right hand normal grip normal wrist range of motion and no wrist instability  Encounter Diagnoses  Name Primary?  . Closed nondisplaced fracture of fifth metatarsal bone of right foot with routine healing, subsequent encounter 09/09/19  Yes  . Carpal tunnel syndrome of right wrist    Recommend 6 weeks carpal tunnel splinting  cts right wrist

## 2019-11-05 ENCOUNTER — Ambulatory Visit (INDEPENDENT_AMBULATORY_CARE_PROVIDER_SITE_OTHER): Payer: BC Managed Care – PPO | Admitting: Internal Medicine

## 2019-11-05 ENCOUNTER — Other Ambulatory Visit: Payer: Self-pay

## 2019-11-05 ENCOUNTER — Encounter (INDEPENDENT_AMBULATORY_CARE_PROVIDER_SITE_OTHER): Payer: Self-pay | Admitting: Internal Medicine

## 2019-11-05 VITALS — BP 125/75 | HR 70 | Temp 97.2°F | Ht 66.0 in | Wt 216.2 lb

## 2019-11-05 DIAGNOSIS — N951 Menopausal and female climacteric states: Secondary | ICD-10-CM

## 2019-11-05 DIAGNOSIS — I10 Essential (primary) hypertension: Secondary | ICD-10-CM

## 2019-11-05 DIAGNOSIS — R5381 Other malaise: Secondary | ICD-10-CM

## 2019-11-05 DIAGNOSIS — R5383 Other fatigue: Secondary | ICD-10-CM

## 2019-11-05 DIAGNOSIS — E669 Obesity, unspecified: Secondary | ICD-10-CM | POA: Diagnosis not present

## 2019-11-05 NOTE — Progress Notes (Signed)
Metrics: Intervention Frequency ACO  Documented Smoking Status Yearly  Screened one or more times in 24 months  Cessation Counseling or  Active cessation medication Past 24 months  Past 24 months   Guideline developer: UpToDate (See UpToDate for funding source) Date Released: 2014       Wellness Office Visit  Subjective:  Patient ID: Michelle Patton, female    DOB: 03-28-1956  Age: 64 y.o. MRN: [id number]  CC: This lady comes in for follow-up of obesity, hypertension, menopausal symptoms of hot flashes.  She also had history of insomnia. HPI  The insomnia and hot flashes have completely improved now.  She takes melatonin on a nightly basis.  She also initially had increase the progesterone to 400 mg every night but then went back to taking 200 mg every night and this is perfectly fine. She also does describe some symptoms of thyroid deficiency including fatigue, weight gain, hair loss, dry skin.  Her T3 levels were suboptimal. She continues to do intermittent fasting almost on a daily basis and tries to eat as healthy as she can as we had discussed previously. She was seen by Dr. Aline Brochure, orthopedics for her carpal tunnel syndrome symptoms and has been given a splint which seems to help.  Past Medical History:  Diagnosis Date  . Anxiety   . Aortic regurgitation 07/20/2014  . Aortic stenosis, severe 07/20/2014  . Arthritis   . DVT (deep venous thrombosis) ([place])   . Family history of adverse reaction to anesthesia    Reports father deliurm in his 35's with CABG  . GERD (gastroesophageal reflux disease)   . History of pneumonia   . Hyperlipidemia   . Hypertension   . Insomnia, unspecified   . Muscle weakness (generalized)   . Peripheral artery disease ([place])   . 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 Sleep  apnea    diagnosed multiple years ago at [facility]  . Supravalvular aortic stenosis, congenital - s/p repair during childhood    Past Surgical History:  Procedure Laterality Date  . ABDOMINAL AORTAGRAM  06/24/12  . ABDOMINAL AORTAGRAM N/A 06/24/2012   Procedure: ABDOMINAL Maxcine Ham;  Surgeon: Angelia Mould, MD;  Location: [facility] CATH LAB;  Service: Cardiovascular;  Laterality: N/A;  . AORTIC VALVE REPLACEMENT N/A 10/07/2014   Procedure: REDO AORTIC VALVE REPLACEMENT (AVR);  Surgeon: Rexene Alberts, MD;  Location: [place];  Service: Open Heart Surgery;  Laterality: N/A;  . ASCENDING AORTIC ROOT REPLACEMENT N/A 10/07/2014   Procedure: ASCENDING AORTIC ROOT REPLACEMENT;  Surgeon: Rexene Alberts, MD;  Location: [place];  Service: Open Heart Surgery;  Laterality: N/A;  . BIOPSY  09/27/2016   Procedure: BIOPSY;  Surgeon: Daneil Dolin, MD;  [facility];  Service: Endoscopy;;  colon  . BREAST REDUCTION SURGERY Bilateral 01/21/2018   Procedure: BREAST REDUCTION WITH LIPOSUCTION;  Surgeon: Cristine Polio, MD;  Location: [place];  Service: Plastics;  Laterality: Bilateral;  . [place]  . CATARACT EXTRACTION W/PHACO Left 05/30/2019   Procedure: CATARACT EXTRACTION PHACO AND INTRAOCULAR LENS PLACEMENT (IOC) (CDE: 4.94  );  Surgeon: Baruch Goldmann, MD;  [facility];  Service: Ophthalmology;  Laterality: Left;  . CATARACT EXTRACTION W/PHACO Right 06/16/2019   Procedure: CATARACT EXTRACTION PHACO AND INTRAOCULAR LENS PLACEMENT (IOC);  Surgeon: Marisa Hua,  Jeneen Rinks, MD;  [facility]: [facility];  Service: Ophthalmology;  Laterality: Right;  CDE: 4.64  . CERVICAL FUSION    . CHOLECYSTECTOMY    . COLONOSCOPY WITH PROPOFOL N/A 09/27/2016   Dr. Gala Romney: Diverticulosis, several tubular adenomas removed ranging 4 to 7 mm in size, internal grade 1 hemorrhoids, terminal ileum normal, segmental biopsies negative for microscopic colitis.  Next colonoscopy June 2021  .  ESOPHAGOGASTRODUODENOSCOPY (EGD) WITH PROPOFOL N/A 09/27/2016   Dr. Gala Romney: Small hiatal hernia, mild Schatzki ring status post disruption, LA grade a esophagitis  . ILIAC ARTERY STENT Left 12/2007  . KNEE ARTHROSCOPY WITH MEDIAL MENISECTOMY Right 01/10/2018   Procedure: RIGHT KNEE ARTHROSCOPY WITH PARTIAL MEDIAL MENISECTOMY;  Surgeon: Carole Civil, MD;  [facility];  Service: Orthopedics;  Laterality: Right;  . LEFT AND RIGHT HEART CATHETERIZATION WITH CORONARY ANGIOGRAM N/A 07/31/2014   Procedure: LEFT AND RIGHT HEART CATHETERIZATION WITH CORONARY ANGIOGRAM;  Surgeon: Burnell Blanks, MD;  Location: [facility] CATH LAB;  Service: Cardiovascular;  Laterality: N/A;  . Venia Minks DILATION N/A 09/27/2016   Procedure: Venia Minks DILATION;  Surgeon: Daneil Dolin, MD;  [facility];  Service: Endoscopy;  Laterality: N/A;  . POLYPECTOMY  09/27/2016   Procedure: POLYPECTOMY;  Surgeon: Daneil Dolin, MD;  [facility];  Service: Endoscopy;;  colon  . ROTATOR CUFF REPAIR Bilateral   . TEE WITHOUT CARDIOVERSION N/A 07/07/2014   Procedure: TRANSESOPHAGEAL ECHOCARDIOGRAM (TEE);  Surgeon: Arnoldo Lenis, MD;  [facility];  Service: Cardiology;  Laterality: N/A;  . TEE WITHOUT CARDIOVERSION N/A 07/20/2014   Procedure: TRANSESOPHAGEAL ECHOCARDIOGRAM (TEE) WITH PROPOFOL;  Surgeon: Arnoldo Lenis, MD;  [facility];  Service: Endoscopy;  Laterality: N/A;  . TEE WITHOUT CARDIOVERSION N/A 10/07/2014   Procedure: TRANSESOPHAGEAL ECHOCARDIOGRAM (TEE);  Surgeon: Rexene Alberts, MD;  Location: [place];  Service: Open Heart Surgery;  Laterality: N/A;     Family History  Problem Relation Age of Onset  . Hypertension Mother   . Hypertension Father   . Heart disease Father        before age 8  . Other Father        varicose veins  . COPD Father   . Colon cancer Neg Hx   . Celiac disease Neg Hx   . Inflammatory bowel disease Neg Hx     Social History   Social History  Narrative   Patient is married   for 5 years . 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   Social History   Tobacco Use  . Smoking status: Current Every Day Smoker    Packs/day: 0.50    Years: 40.00    Pack years: 20.00    Types: Cigarettes  . Smokeless tobacco: Never Used  Substance Use Topics  . Alcohol use: Yes    Alcohol/week: 2.0 standard drinks    Types: 2 Cans of beer per week    Current Meds  Medication Sig  . Cholecalciferol (VITAMIN D3) 5000 UNITS TABS Take 10,000 Units by mouth every morning.   . clonazePAM (KLONOPIN) 0.5 MG tablet Take 1 tablet (0.5 mg total) by mouth 2 (two) times daily as needed for anxiety.  . diclofenac (VOLTAREN) 75 MG EC tablet Take 1 tablet (75 mg total) by mouth 2 (two) times daily. **DO NOT CRUSH**  . Flaxseed, Linseed, (FLAXSEED OIL) 1000 MG CAPS Take 1,000 mg by mouth daily.   [name] ibuprofen (ADVIL) 800  MG tablet Take 1 tablet (800 mg total) by mouth 3 (three) times daily.  [name] losartan (COZAAR) 25 MG tablet TAKE 1 TABLET BY MOUTH EVERY DAY (Patient taking differently: Take 25 mg by mouth daily. )  . Melatonin 3 MG SUBL Place 4 mg under the tongue at bedtime.  . metoprolol tartrate (LOPRESSOR) 25 MG tablet TAKE 1 TABLET TWICE A DAY (Patient taking differently: Take 25 mg by mouth 2 (two) times daily. )  . Multiple Minerals (CALCIUM-MAGNESIUM-ZINC) TABS Take 1 tablet by mouth daily.  . Multiple Vitamin (MULTIVITAMIN WITH MINERALS) TABS tablet Take 1 tablet by mouth daily.  . progesterone (PROMETRIUM) 200 MG capsule Take 1 capsule (200 mg total) by mouth daily.  . RABEprazole (ACIPHEX) 20 MG tablet Take 20 mg by mouth 2 (two) times daily.   . rosuvastatin (CRESTOR) 20 MG tablet Take 1 tablet (20 mg total) by mouth daily.  . vitamin B-12 (CYANOCOBALAMIN) 1000 MCG tablet Take 1,000 mcg by mouth daily.  [name] zolpidem (AMBIEN) 5 MG tablet Take 1 tablet (5 mg total) by mouth at  bedtime as needed for sleep.      Depression screen [facility] 2/9 07/16/2019 04/07/2019 12/09/2018 11/04/2014  Decreased Interest 0 0 0 0  Down, Depressed, Hopeless 0 0 0 1  PHQ - 2 Score 0 0 0 1  Some recent data might be hidden     Objective:   Today's Vitals: BP 125/75 (BP Location: Left Arm, Patient Position: Sitting, Cuff Size: Normal)   Pulse 70   Temp (!) 97.2 F (36.2 C) (Temporal)   Ht 5\' 6"  (1.676 m)   Wt 216 lb 3.2 oz (98.1 kg)   SpO2 97%   BMI 34.90 kg/m  Vitals with BMI 11/05/2019 10/14/2019 09/24/2019  Height 5\' 6"  5\' 6"  5\' 6"   Weight 216 lbs 3 oz 221 lbs 10 oz 228 lbs 10 oz  BMI 34.91 03.50 09.38  Systolic 182 993 716  Diastolic 75 80 75  Pulse 70 89 84     Physical Exam    Although she remains obese, she has lost another 5 pounds and in total she has lost around 20 pounds.  This is great.  Her blood pressure is improving also.   Assessment   1. Hot flashes due to menopause   2. Obesity (BMI 30-39.9)   3. Essential hypertension   4. Malaise and fatigue       Tests ordered No orders of the defined types were placed in this encounter.    Plan: 1. She will continue with losartan for hypertension which seems to keep her blood pressure under good control but I suspect we will be able to discontinue this as she continues to lose weight. 2. Her hot flashes have improved with progesterone 20 mg at night and she will continue with this. 3. As far as her obesity is concerned, she will continue to work on the diet that she is currently on. 4. I do believe she has symptoms of thyroid deficiency and I am going to give her samples of NP thyroid.  She will start with NP thyroid 15 mg every day and increase every week to achieve a dose of NP thyroid 45 mg every day.  The NP thyroid is prescribed off label for symptoms of  thyroid deficiency and explained this to the patient. 5. Follow-up in 6 weeks and at this time we will probably do some blood work.   No orders of  the defined types were placed  in this encounter.   Doree Albee, MD

## 2019-11-06 ENCOUNTER — Ambulatory Visit: Payer: BC Managed Care – PPO | Admitting: Orthopedic Surgery

## 2019-11-20 ENCOUNTER — Other Ambulatory Visit (INDEPENDENT_AMBULATORY_CARE_PROVIDER_SITE_OTHER): Payer: Self-pay

## 2019-11-20 MED ORDER — PROGESTERONE 200 MG PO CAPS: 200.0000 mg | ORAL_CAPSULE | Freq: Every day | ORAL | 0 refills | Status: DC

## 2019-12-05 ENCOUNTER — Ambulatory Visit (INDEPENDENT_AMBULATORY_CARE_PROVIDER_SITE_OTHER): Payer: BC Managed Care – PPO | Admitting: Orthopedic Surgery

## 2019-12-05 ENCOUNTER — Other Ambulatory Visit: Payer: Self-pay

## 2019-12-05 ENCOUNTER — Ambulatory Visit: Payer: BC Managed Care – PPO

## 2019-12-05 ENCOUNTER — Encounter: Payer: Self-pay | Admitting: Orthopedic Surgery

## 2019-12-05 VITALS — BP 131/84 | HR 67 | Ht 66.0 in | Wt 216.0 lb

## 2019-12-05 DIAGNOSIS — S92354D Nondisplaced fracture of fifth metatarsal bone, right foot, subsequent encounter for fracture with routine healing: Secondary | ICD-10-CM | POA: Diagnosis not present

## 2019-12-05 DIAGNOSIS — G5601 Carpal tunnel syndrome, right upper limb: Secondary | ICD-10-CM | POA: Insufficient documentation

## 2019-12-05 NOTE — Patient Instructions (Addendum)
VIT B6 100 MG BID   MAKE APPT 3 WEEKS BEFORE YOU WANT SURGERY    Carpal Tunnel Syndrome  Carpal tunnel syndrome is a condition that causes pain in your hand and arm. The carpal tunnel is a narrow area located on the palm side of your wrist. Repeated wrist motion or certain diseases may cause swelling within the tunnel. This swelling pinches the main nerve in the wrist (median nerve). What are the causes? This condition may be caused by:  Repeated wrist motions.  Wrist injuries.  Arthritis.  A cyst or tumor in the carpal tunnel.  Fluid buildup during pregnancy. Sometimes the cause of this condition is not known. What increases the risk? The following factors may make you more likely to develop this condition:  Having a job, such as being a Research scientist (life sciences), that requires you to repeatedly move your wrist in the same motion.  Being a woman.  Having certain conditions, such as: ? Diabetes. ? Obesity. ? An underactive thyroid (hypothyroidism). ? Kidney failure. What are the signs or symptoms? Symptoms of this condition include:  A tingling feeling in your fingers, especially in your thumb, index, and middle fingers.  Tingling or numbness in your hand.  An aching feeling in your entire arm, especially when your wrist and elbow are bent for a long time.  Wrist pain that goes up your arm to your shoulder.  Pain that goes down into your palm or fingers.  A weak feeling in your hands. You may have trouble grabbing and holding items. Your symptoms may feel worse during the night. How is this diagnosed? This condition is diagnosed with a medical history and physical exam. You may also have tests, including:  Electromyogram (EMG). This test measures electrical signals sent by your nerves into the muscles.  Nerve conduction study. This test measures how well electrical signals pass through your nerves.  Imaging tests, such as X-rays, ultrasound, and MRI. These tests  check for possible causes of your condition. How is this treated? This condition may be treated with:  Lifestyle changes. It is important to stop or change the activity that caused your condition.  Doing exercise and activities to strengthen your muscles and bones (physical therapy).  Learning how to use your hand again after diagnosis (occupational therapy).  Medicines for pain and inflammation. This may include medicine that is injected into your wrist.  A wrist splint.  Surgery. Follow these instructions at home: If you have a splint:  Wear the splint as told by your health care provider. Remove it only as told by your health care provider.  Loosen the splint if your fingers tingle, become numb, or turn cold and blue.  Keep the splint clean.  If the splint is not waterproof: ? Do not let it get wet. ? Cover it with a watertight covering when you take a bath or shower. Managing pain, stiffness, and swelling   If directed, put ice on the painful area: ? If you have a removable splint, remove it as told by your health care provider. ? Put ice in a plastic bag. ? Place a towel between your skin and the bag. ? Leave the ice on for 20 minutes, 2-3 times per day. General instructions  Take over-the-counter and prescription medicines only as told by your health care provider.  Rest your wrist from any activity that may be causing your pain. If your condition is work related, talk with your employer about changes that can  be made, such as getting a wrist pad to use while typing.  Do any exercises as told by your health care provider, physical therapist, or occupational therapist.  Keep all follow-up visits as told by your health care provider. This is important. Contact a health care provider if:  You have new symptoms.  Your pain is not controlled with medicines.  Your symptoms get worse. Get help right away if:  You have severe numbness or tingling in your wrist or  hand. Summary  Carpal tunnel syndrome is a condition that causes pain in your hand and arm.  It is usually caused by repeated wrist motions.  Lifestyle changes and medicines are used to treat carpal tunnel syndrome. Surgery may be recommended.  Follow your health care provider's instructions about wearing a splint, resting from activity, keeping follow-up visits, and calling for help. This information is not intended to replace advice given to you by your health care provider. Make sure you discuss any questions you have with your health care provider. Document Revised: 08/17/2017 Document Reviewed: 08/17/2017 Elsevier Patient Education  [place].

## 2019-12-05 NOTE — Progress Notes (Signed)
Chief Complaint  Patient presents with  . Foot Injury    right / has pain with walking (wearing flip flops) 09/09/2019  . Carpal Tunnel    dropping items and numbness tingling    In the global period for the fracture right foot  Patient has a metatarsal fracture of #5 is oblique spiral  She is going to wearing flip-flops at this point not having any pain other than when she is walking and those.  X-ray shows the fracture no displacement fracture line still visible but this is expected  Separate problem   CC: pain paresthesias dropping things right hand x several months   Patient is wearing braces already  Past Medical History:  Diagnosis Date  . Anxiety   . Aortic regurgitation 07/20/2014  . Aortic stenosis, severe 07/20/2014  . Arthritis   . DVT (deep venous thrombosis) ([place])   . Family history of adverse reaction to anesthesia    Reports father deliurm in his 31's with CABG  . GERD (gastroesophageal reflux disease)   . History of pneumonia   . Hyperlipidemia   . Hypertension   . Insomnia, unspecified   . Muscle weakness (generalized)   . Peripheral artery disease ([place])   . 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 Sleep apnea    diagnosed multiple years ago at [facility]  . Supravalvular aortic stenosis, congenital - s/p repair during childhood      Exam   BP 131/84   Pulse 67   Ht 5\' 6"  (1.676 m)   Wt 216 lb (98 kg)   BMI 34.86 kg/m   Physical Exam Constitutional:      Appearance: Normal appearance.  Neurological:     Mental Status: She is alert and oriented to person, place, and time.  Psychiatric:        Mood and Affect: Mood normal.     Patient is noted to have decreased sensation thumb index long and ring finger no atrophy capillary refill color temperature normal grip  strength normal  Wrist stable  Patient will probably need surgery she is failed conservative care with bracing.  She cannot do it right now just got a job promotion  We have advised her to call us about 3 weeks before she is ready to schedule right carpal tunnel release.  Encounter Diagnoses  Name Primary?  . Closed nondisplaced fracture of fifth metatarsal bone of right foot with routine healing, subsequent encounter 09/09/19  Yes  . Carpal tunnel syndrome of right wrist    Chr/surgery needed

## 2019-12-08 ENCOUNTER — Other Ambulatory Visit (INDEPENDENT_AMBULATORY_CARE_PROVIDER_SITE_OTHER): Payer: Self-pay

## 2019-12-08 MED ORDER — THYROID 15 MG PO TABS: 45.0000 mg | ORAL_TABLET | Freq: Every day | ORAL | 0 refills | Status: DC

## 2019-12-13 ENCOUNTER — Ambulatory Visit (INDEPENDENT_AMBULATORY_CARE_PROVIDER_SITE_OTHER): Payer: BC Managed Care – PPO

## 2019-12-13 ENCOUNTER — Ambulatory Visit
Admission: EM | Admit: 2019-12-13 | Discharge: 2019-12-13 | Disposition: A | Discharge status: Home / Self Care | Payer: BC Managed Care – PPO | Attending: Family Medicine | Admitting: Family Medicine

## 2019-12-13 DIAGNOSIS — S99921A Unspecified injury of right foot, initial encounter: Secondary | ICD-10-CM

## 2019-12-13 DIAGNOSIS — M21961 Unspecified acquired deformity of right lower leg: Secondary | ICD-10-CM

## 2019-12-13 DIAGNOSIS — S92511A Displaced fracture of proximal phalanx of right lesser toe(s), initial encounter for closed fracture: Secondary | ICD-10-CM

## 2019-12-13 DIAGNOSIS — Z8781 Personal history of (healed) traumatic fracture: Secondary | ICD-10-CM | POA: Diagnosis not present

## 2019-12-13 MED ORDER — HYDROCODONE-ACETAMINOPHEN 5-325 MG PO TABS: 1.0000 | ORAL_TABLET | Freq: Four times a day (QID) | ORAL | 0 refills | Status: DC | PRN

## 2019-12-13 MED ORDER — IBUPROFEN 600 MG PO TABS: 600.0000 mg | ORAL_TABLET | Freq: Three times a day (TID) | ORAL | 0 refills | Status: DC | PRN

## 2019-12-13 NOTE — ED Triage Notes (Signed)
Pt had trip and fall and re injured broken foot, deformity noted

## 2019-12-13 NOTE — Discharge Instructions (Addendum)
You have a fracture of the third toe of the foot Wear the boot Rest, ice, elevate Pain medicine as needed Follow-up with your orthopedic specialist on Monday

## 2019-12-15 ENCOUNTER — Ambulatory Visit (HOSPITAL_COMMUNITY): Payer: BC Managed Care – PPO | Admitting: Physical Therapy

## 2019-12-15 ENCOUNTER — Telehealth (HOSPITAL_COMMUNITY): Payer: Self-pay | Admitting: Physical Therapy

## 2019-12-15 ENCOUNTER — Telehealth: Payer: Self-pay | Admitting: Orthopedic Surgery

## 2019-12-15 ENCOUNTER — Other Ambulatory Visit: Payer: Self-pay

## 2019-12-15 ENCOUNTER — Telehealth (INDEPENDENT_AMBULATORY_CARE_PROVIDER_SITE_OTHER): Payer: Self-pay

## 2019-12-15 ENCOUNTER — Other Ambulatory Visit (INDEPENDENT_AMBULATORY_CARE_PROVIDER_SITE_OTHER): Payer: Self-pay | Admitting: Internal Medicine

## 2019-12-15 ENCOUNTER — Encounter: Payer: Self-pay | Admitting: Orthopedic Surgery

## 2019-12-15 ENCOUNTER — Ambulatory Visit: Payer: BC Managed Care – PPO

## 2019-12-15 ENCOUNTER — Ambulatory Visit (INDEPENDENT_AMBULATORY_CARE_PROVIDER_SITE_OTHER): Payer: BC Managed Care – PPO | Admitting: Orthopedic Surgery

## 2019-12-15 VITALS — BP 132/62 | HR 84 | Ht 66.0 in

## 2019-12-15 DIAGNOSIS — S92511A Displaced fracture of proximal phalanx of right lesser toe(s), initial encounter for closed fracture: Secondary | ICD-10-CM

## 2019-12-15 DIAGNOSIS — Z13820 Encounter for screening for osteoporosis: Secondary | ICD-10-CM

## 2019-12-15 DIAGNOSIS — N951 Menopausal and female climacteric states: Secondary | ICD-10-CM

## 2019-12-15 NOTE — ED Provider Notes (Signed)
[facility]    CSN: [id number] Arrival date & time: 12/13/19  1427      History   Chief Complaint Chief Complaint  Patient presents with  . Foot Injury    HPI Michelle Patton is a 64 y.o. female.   Pt is a 64 year old female that presents today for trip and fall.  Reporting injury to right foot.  Deformity to proximal phalanx of third toe on right foot. Swollen and very tender to touch.  History of fifth metatarsal fracture a few months prior.  Recently just stopped using the cam walker boot     Past Medical History:  Diagnosis Date  . Anxiety   . Aortic regurgitation 07/20/2014  . Aortic stenosis, severe 07/20/2014  . Arthritis   . DVT (deep venous thrombosis) ([place])   . Family history of adverse reaction to anesthesia    Reports father deliurm in his 79's with CABG  . GERD (gastroesophageal reflux disease)   . History of pneumonia   . Hyperlipidemia   . Hypertension   . Insomnia, unspecified   . Muscle weakness (generalized)   . Peripheral artery disease ([place])   . 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 Sleep apnea    diagnosed multiple years ago at [facility]  . Supravalvular aortic stenosis, congenital - s/p repair during childhood     Patient Active Problem List   Diagnosis Date Noted  . Carpal tunnel syndrome of right wrist 12/05/2019  . Nondisplaced fracture of fifth right metatarsal bone with routine healing 10/21/2019  . Skin lesion 04/11/2019  . Low back pain without sciatica 01/20/2019  . Upper abdominal pain 01/14/2019  . Fatigue 12/09/2018  . Breast cancer screening 12/09/2018  . 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 S/P right knee arthroscopy 01/10/18 01/18/2018  . Chondromalacia, patella, right   . Chondromalacia  of medial condyle of right femur   . Personal history of colonic polyps 12/28/2016  . Abdominal pain, epigastric 08/10/2016  . Left sided abdominal pain 08/10/2016  . Esophageal dysphagia 08/10/2016  . 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 Supravalvular aortic stenosis, congenital - s/p repair during childhood   . Essential hypertension   . Hyperlipidemia   . GERD (gastroesophageal reflux disease)   . Aortic stenosis, severe 07/20/2014  . Aortic regurgitation 07/20/2014  . Leg cramps 09/26/2012  . Occlusion and stenosis of carotid artery without mention of cerebral infarction 07/10/2012  . Peripheral vascular disease, unspecified ([place]) 06/12/2012  . Carotid artery bruit 06/12/2012  . CONSTIPATION 12/28/2009  . NAUSEA AND VOMITING 12/28/2009  . DIARRHEA 12/28/2009    Past Surgical History:  Procedure Laterality Date  . ABDOMINAL AORTAGRAM  06/24/12  . ABDOMINAL AORTAGRAM N/A 06/24/2012   Procedure: ABDOMINAL Maxcine Ham;  Surgeon: Angelia Mould, MD;  Location: [facility] CATH LAB;  Service: Cardiovascular;  Laterality: N/A;  . AORTIC VALVE REPLACEMENT N/A 10/07/2014   Procedure: REDO AORTIC VALVE REPLACEMENT (AVR);  Surgeon: Rexene Alberts, MD;  Location: [place];  Service: Open Heart Surgery;  Laterality: N/A;  . ASCENDING AORTIC ROOT REPLACEMENT N/A 10/07/2014   Procedure: ASCENDING AORTIC ROOT REPLACEMENT;  Surgeon: Rexene Alberts, MD;  Location: [place];  Service: Open Heart Surgery;  Laterality: N/A;  . BIOPSY  09/27/2016   Procedure: BIOPSY;  Surgeon: Daneil Dolin, MD;  [facility];  Service: Endoscopy;;  colon  . BREAST REDUCTION SURGERY Bilateral 01/21/2018   Procedure: BREAST REDUCTION WITH LIPOSUCTION;  Surgeon: Cristine Polio, MD;  Location: [place];  Service: Plastics;  Laterality: Bilateral;  . [place]  . CATARACT EXTRACTION W/PHACO Left 05/30/2019   Procedure: CATARACT  EXTRACTION PHACO AND INTRAOCULAR LENS PLACEMENT (IOC) (CDE: 4.94  );  Surgeon: Baruch Goldmann, MD;  Location: [facility];  Service: Ophthalmology;  Laterality: Left;  . CATARACT EXTRACTION W/PHACO Right 06/16/2019   Procedure: CATARACT EXTRACTION PHACO AND INTRAOCULAR LENS PLACEMENT (IOC);  Surgeon: Baruch Goldmann, MD;  Location: [facility];  Service: Ophthalmology;  Laterality: Right;  CDE: 4.64  . CERVICAL FUSION    . CHOLECYSTECTOMY    . COLONOSCOPY WITH PROPOFOL N/A 09/27/2016   Dr. Gala Romney: Diverticulosis, several tubular adenomas removed ranging 4 to 7 mm in size, internal grade 1 hemorrhoids, terminal ileum normal, segmental biopsies negative for microscopic colitis.  Next colonoscopy June 2021  . ESOPHAGOGASTRODUODENOSCOPY (EGD) WITH PROPOFOL N/A 09/27/2016   Dr. Gala Romney: Small hiatal hernia, mild Schatzki ring status post disruption, LA grade a esophagitis  . ILIAC ARTERY STENT Left 12/2007  . KNEE ARTHROSCOPY WITH MEDIAL MENISECTOMY Right 01/10/2018   Procedure: RIGHT KNEE ARTHROSCOPY WITH PARTIAL MEDIAL MENISECTOMY;  Surgeon: Carole Civil, MD;  Location: [facility];  Service: Orthopedics;  Laterality: Right;  . LEFT AND RIGHT HEART CATHETERIZATION WITH CORONARY ANGIOGRAM N/A 07/31/2014   Procedure: LEFT AND RIGHT HEART CATHETERIZATION WITH CORONARY ANGIOGRAM;  Surgeon: Burnell Blanks, MD;  Location: [facility] CATH LAB;  Service: Cardiovascular;  Laterality: N/A;  . Venia Minks DILATION N/A 09/27/2016   Procedure: Venia Minks DILATION;  Surgeon: Daneil Dolin, MD;  [facility];  Service: Endoscopy;  Laterality: N/A;  . POLYPECTOMY  09/27/2016   Procedure: POLYPECTOMY;  Surgeon: Daneil Dolin, MD;  [facility];  Service: Endoscopy;;  colon  . ROTATOR CUFF REPAIR Bilateral   . TEE WITHOUT CARDIOVERSION N/A 07/07/2014   Procedure: TRANSESOPHAGEAL ECHOCARDIOGRAM (TEE);  Surgeon: Arnoldo Lenis, MD;  [facility];  Service: Cardiology;  Laterality: N/A;  . TEE WITHOUT  CARDIOVERSION N/A 07/20/2014   Procedure: TRANSESOPHAGEAL ECHOCARDIOGRAM (TEE) WITH PROPOFOL;  Surgeon: Arnoldo Lenis, MD;  Location[facility];  Service: Endoscopy;  Laterality: N/A;  . TEE WITHOUT CARDIOVERSION N/A 10/07/2014   Procedure: TRANSESOPHAGEAL ECHOCARDIOGRAM (TEE);  Surgeon: Rexene Alberts, MD;  Location: [place];  Service: Open Heart Surgery;  Laterality: N/A;    OB History   No obstetric history on file.      Home Medications    Prior to Admission medications   Medication Sig Start Date End Date Taking? Authorizing Provider  Cholecalciferol (VITAMIN D3) 5000 UNITS TABS Take 10,000 Units by mouth every morning.     [provider]  clonazePAM (KLONOPIN) 0.5 MG tablet Take 1 tablet (0.5 mg total) by mouth 2 (two) times daily as needed for anxiety. 01/20/19   Marcial Pacas, MD  diclofenac (VOLTAREN) 75 MG EC tablet Take 1 tablet (75 mg total) by mouth 2 (two) times daily. **DO NOT CRUSH** 07/23/19   Carole Civil, MD  Flaxseed, Linseed, (FLAXSEED OIL) 1000 MG CAPS Take 1,000 mg by mouth daily.     [provider]  furosemide (LASIX) 40 MG tablet Take 1 tablet (40 mg total) by mouth daily as needed for edema.  Patient taking differently: Take 40 mg by mouth daily as needed.  10/23/17 01/23/19  Arnoldo Lenis, MD  HYDROcodone-acetaminophen (NORCO/VICODIN) 5-325 MG tablet Take 1-2 tablets by mouth every 6 (six) hours as needed. 12/13/19   Loura Halt A, NP  ibuprofen (ADVIL) 600 MG tablet Take 1 tablet (600 mg total) by mouth every 8 (eight) hours as needed for moderate pain. 12/13/19   Loura Halt A, NP  losartan (COZAAR) 25 MG tablet TAKE 1 TABLET BY MOUTH EVERY DAY Patient taking differently: Take 25 mg by mouth daily.  02/17/19   Arnoldo Lenis, MD  Melatonin 3 MG SUBL Place 4 mg under the tongue at bedtime.    [provider]  metoprolol tartrate (LOPRESSOR) 25 MG tablet TAKE 1 TABLET TWICE A DAY Patient taking differently: Take 25 mg by mouth 2  (two) times daily.  04/09/19   Arnoldo Lenis, MD  Multiple Minerals (CALCIUM-MAGNESIUM-ZINC) TABS Take 1 tablet by mouth daily.    [provider]  Multiple Vitamin (MULTIVITAMIN WITH MINERALS) TABS tablet Take 1 tablet by mouth daily.    [provider]  progesterone (PROMETRIUM) 200 MG capsule Take 1 capsule (200 mg total) by mouth daily. 11/20/19   Ailene Ards, NP  RABEprazole (ACIPHEX) 20 MG tablet Take 20 mg by mouth 2 (two) times daily.     [provider]  rosuvastatin (CRESTOR) 20 MG tablet Take 1 tablet (20 mg total) by mouth daily. 07/28/19   Corum, Rex Kras, MD  thyroid (NP THYROID) 15 MG tablet Take 3 tablets (45 mg total) by mouth daily. 12/08/19   Ailene Ards, NP  vitamin B-12 (CYANOCOBALAMIN) 1000 MCG tablet Take 1,000 mcg by mouth daily.    [provider]  zolpidem (AMBIEN) 5 MG tablet Take 1 tablet (5 mg total) by mouth at bedtime as needed for sleep. 07/16/19   Corum, Rex Kras, MD    Family History Family History  Problem Relation Age of Onset  . Hypertension Mother   . Hypertension Father   . Heart disease Father        before age 41  . Other Father        varicose veins  . COPD Father   . Colon cancer Neg Hx   . Celiac disease Neg Hx   . Inflammatory bowel disease Neg Hx     Social History Social History   Tobacco Use  . Smoking status: Current Every Day Smoker    Packs/day: 0.50    Years: 40.00    Pack years: 20.00    Types: Cigarettes  . Smokeless tobacco: Never Used  Vaping Use  . Vaping Use: Never used  Substance Use Topics  . Alcohol use: Yes    Alcohol/week: 2.0 standard drinks    Types: 2 Cans of beer per week  . Drug use: No     Allergies   Penicillins and Sulfa antibiotics   Review of Systems Review of Systems   Physical Exam Triage Vital Signs ED Triage Vitals  Enc Vitals Group     BP 12/13/19 1506 113/77     Pulse Rate 12/13/19 1506 72     Resp 12/13/19 1506 20     Temp 12/13/19 1506 98 F  (36.7 C)     Temp src --      SpO2 12/13/19 1506 98 %     Weight --      Height --      Head Circumference --  Peak Flow --      Pain Score 12/13/19 1504 7     Pain Loc --      Pain Edu? --      Excl. in [place]? --    No data found.  Updated Vital Signs BP 113/77   Pulse 72   Temp 98 F (36.7 C)   Resp 20   SpO2 98%   Visual Acuity Right Eye Distance:   Left Eye Distance:   Bilateral Distance:    Right Eye Near:   Left Eye Near:    Bilateral Near:     Physical Exam Vitals and nursing note reviewed.  Constitutional:      General: She is not in acute distress.    Appearance: Normal appearance. She is not ill-appearing, toxic-appearing or diaphoretic.  HENT:     Head: Normocephalic.     Nose: Nose normal.  Eyes:     Conjunctiva/sclera: Conjunctivae normal.  Pulmonary:     Effort: Pulmonary effort is normal.  Musculoskeletal:        General: Normal range of motion.     Cervical back: Normal range of motion.       Feet:  Skin:    General: Skin is warm and dry.     Findings: No rash.  Neurological:     Mental Status: She is alert.  Psychiatric:        Mood and Affect: Mood normal.      UC Treatments / Results  Labs (all labs ordered are listed, but only abnormal results are displayed) Labs Reviewed - No data to display  EKG   Radiology DG Foot Complete Right  Result Date: 12/13/2019 CLINICAL DATA:  Right foot fracture of the fifth metatarsal in May of 2021. Recent injury and deformity. EXAM: RIGHT FOOT COMPLETE - 3+ VIEW COMPARISON:  Sep 09, 2019, October 23, 2019, and December 05, 2019 FINDINGS: The patient has known fifth metatarsal fracture is similar since October 23, 2019. There does appear to be some developing callus in this location. There is a new displaced fracture through the proximal third phalanx. No other acute abnormalities. IMPRESSION: 1. New displaced fracture through the proximal third phalanx. 2. Healing fracture of the fifth metatarsal.  Electronically Signed   By: Dorise Bullion III M.D   On: 12/13/2019 15:45    Procedures Procedures (including critical care time)  Medications Ordered in UC Medications - No data to display  Initial Impression / Assessment and Plan / UC Course  I have reviewed the triage vital signs and the nursing notes.  Pertinent labs & imaging results that were available during my care of the patient were reviewed by me and considered in my medical decision making (see chart for details).     Closed displaced fracture proximal phalanx of third toe right foot. Patient has cam walker boot.  We will have her start wearing this again.  Rest, ice, elevate.  Hydrocodone for pain as needed.  Plans to follow-up with orthopedics on Monday Final Clinical Impressions(s) / UC Diagnoses   Final diagnoses:  Closed displaced fracture of proximal phalanx of lesser toe of right foot, initial encounter     Discharge Instructions     You have a fracture of the third toe of the foot Wear the boot Rest, ice, elevate Pain medicine as needed Follow-up with your orthopedic specialist on Monday    ED Prescriptions    Medication Sig Dispense Auth. Provider   HYDROcodone-acetaminophen (NORCO/VICODIN) 5-325 MG tablet  Take 1-2 tablets by mouth every 6 (six) hours as needed. 20 tablet Minervia Osso A, NP   ibuprofen (ADVIL) 600 MG tablet Take 1 tablet (600 mg total) by mouth every 8 (eight) hours as needed for moderate pain. 30 tablet Brycin Kille A, NP     I have reviewed the PDMP during this encounter.   Orvan July, NP 12/15/19 1144

## 2019-12-15 NOTE — Progress Notes (Signed)
NEW PROBLEM//OFFICE VISIT  Chief Complaint  Patient presents with  . Foot Pain    new inj 3 rd toe right foot    64 year old female recently had 1/5 metatarsal fracture hit her foot when she got up from a chair injured the third toe proximal phalanx new fracture. Place her self in a boot complains of pain and deformity of the digit   Review of Systems  Constitutional: Negative for chills and fever.  Skin: Negative.   Neurological: Negative for tingling.     Past Medical History:  Diagnosis Date  . Anxiety   . Aortic regurgitation 07/20/2014  . Aortic stenosis, severe 07/20/2014  . Arthritis   . DVT (deep venous thrombosis) ([place])   . Family history of adverse reaction to anesthesia    Reports father deliurm in his 30's with CABG  . GERD (gastroesophageal reflux disease)   . History of pneumonia   . Hyperlipidemia   . Hypertension   . Insomnia, unspecified   . Muscle weakness (generalized)   . Peripheral artery disease ([place])   . 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 Sleep apnea    diagnosed multiple years ago at [facility]  . Supravalvular aortic stenosis, congenital - s/p repair during childhood     Past Surgical History:  Procedure Laterality Date  . ABDOMINAL AORTAGRAM  06/24/12  . ABDOMINAL AORTAGRAM N/A 06/24/2012   Procedure: ABDOMINAL Maxcine Ham;  Surgeon: Angelia Mould, MD;  Location: [facility] CATH LAB;  Service: Cardiovascular;  Laterality: N/A;  . AORTIC VALVE REPLACEMENT N/A 10/07/2014   Procedure: REDO AORTIC VALVE REPLACEMENT (AVR);  Surgeon: Rexene Alberts, MD;  Location: [place];  Service: Open Heart Surgery;  Laterality: N/A;  . ASCENDING AORTIC ROOT REPLACEMENT N/A 10/07/2014   Procedure: ASCENDING AORTIC ROOT REPLACEMENT;  Surgeon: Rexene Alberts, MD;  Location: [place];  Service:  Open Heart Surgery;  Laterality: N/A;  . BIOPSY  09/27/2016   Procedure: BIOPSY;  Surgeon: Daneil Dolin, MD;  [facility];  Service: Endoscopy;;  colon  . BREAST REDUCTION SURGERY Bilateral 01/21/2018   Procedure: BREAST REDUCTION WITH LIPOSUCTION;  Surgeon: Cristine Polio, MD;  Location: [place];  Service: Plastics;  Laterality: Bilateral;  . [place]  . CATARACT EXTRACTION W/PHACO Left 05/30/2019   Procedure: CATARACT EXTRACTION PHACO AND INTRAOCULAR LENS PLACEMENT (IOC) (CDE: 4.94  );  Surgeon: Baruch Goldmann, MD;  Location: [facility];  Service: Ophthalmology;  Laterality: Left;  . CATARACT EXTRACTION W/PHACO Right 06/16/2019   Procedure: CATARACT EXTRACTION PHACO AND INTRAOCULAR LENS PLACEMENT (IOC);  Surgeon: Baruch Goldmann, MD;  Location: [facility];  Service: Ophthalmology;  Laterality: Right;  CDE: 4.64  . CERVICAL FUSION    . CHOLECYSTECTOMY    . COLONOSCOPY WITH PROPOFOL N/A 09/27/2016   Dr. Gala Romney: Diverticulosis, several tubular adenomas removed ranging 4 to 7 mm in size, internal grade 1 hemorrhoids, terminal ileum normal, segmental biopsies negative for microscopic colitis.  Next colonoscopy June 2021  . ESOPHAGOGASTRODUODENOSCOPY (EGD) WITH PROPOFOL N/A 09/27/2016   Dr. Gala Romney: Small hiatal hernia, mild Schatzki ring status post disruption, LA grade a esophagitis  . ILIAC ARTERY STENT Left 12/2007  . KNEE ARTHROSCOPY WITH MEDIAL MENISECTOMY Right 01/10/2018   Procedure: RIGHT KNEE ARTHROSCOPY WITH PARTIAL MEDIAL MENISECTOMY;  Surgeon: Carole Civil, MD;  Location: [facility];  Service: Orthopedics;  Laterality: Right;  . LEFT AND RIGHT HEART CATHETERIZATION WITH CORONARY ANGIOGRAM N/A 07/31/2014   Procedure: LEFT AND RIGHT HEART CATHETERIZATION WITH CORONARY ANGIOGRAM;  Surgeon: Burnell Blanks, MD;  Location: [facility] CATH LAB;  Service: Cardiovascular;  Laterality: N/A;  . Venia Minks DILATION N/A 09/27/2016   Procedure: Venia Minks DILATION;  Surgeon:  Daneil Dolin, MD;  [facility];  Service: Endoscopy;  Laterality: N/A;  . POLYPECTOMY  09/27/2016   Procedure: POLYPECTOMY;  Surgeon: Daneil Dolin, MD;  [facility];  Service: Endoscopy;;  colon  . ROTATOR CUFF REPAIR Bilateral   . TEE WITHOUT CARDIOVERSION N/A 07/07/2014   Procedure: TRANSESOPHAGEAL ECHOCARDIOGRAM (TEE);  Surgeon: Arnoldo Lenis, MD;  [facility];  Service: Cardiology;  Laterality: N/A;  . TEE WITHOUT CARDIOVERSION N/A 07/20/2014   Procedure: TRANSESOPHAGEAL ECHOCARDIOGRAM (TEE) WITH PROPOFOL;  Surgeon: Arnoldo Lenis, MD;  Location: [facility];  Service: Endoscopy;  Laterality: N/A;  . TEE WITHOUT CARDIOVERSION N/A 10/07/2014   Procedure: TRANSESOPHAGEAL ECHOCARDIOGRAM (TEE);  Surgeon: Rexene Alberts, MD;  Location: [place];  Service: Open Heart Surgery;  Laterality: N/A;    Family History  Problem Relation Age of Onset  . Hypertension Mother   . Hypertension Father   . Heart disease Father        before age 21  . Other Father        varicose veins  . COPD Father   . Colon cancer Neg Hx   . Celiac disease Neg Hx   . Inflammatory bowel disease Neg Hx    Social History   Tobacco Use  . Smoking status: Current Every Day Smoker    Packs/day: 0.50    Years: 40.00    Pack years: 20.00    Types: Cigarettes  . Smokeless tobacco: Never Used  Vaping Use  . Vaping Use: Never used  Substance Use Topics  . Alcohol use: Yes    Alcohol/week: 2.0 standard drinks    Types: 2 Cans of beer per week  . Drug use: No    Allergies  Allergen Reactions  . Penicillins Hives    Has patient had a PCN reaction causing immediate rash, facial/tongue/throat swelling, SOB or lightheadedness with hypotension: Yes Has patient had a PCN reaction causing severe rash involving mucus membranes or skin necrosis: No Has patient had a PCN reaction that required hospitalization No Has patient had a PCN reaction occurring within the last 10 years: No If  all of the above answers are "NO", then may proceed with Cephalosporin use.  Have taken Keflex before without any adverse reaction.   . Sulfa Antibiotics Nausea And Vomiting    Current Meds  Medication Sig  . Cholecalciferol (VITAMIN D3) 5000 UNITS TABS Take 10,000 Units by mouth every morning.   . clonazePAM (KLONOPIN) 0.5 MG tablet Take 1 tablet (0.5 mg total) by mouth 2 (two) times daily as needed for anxiety.  . diclofenac (VOLTAREN) 75 MG EC tablet Take 1 tablet (75 mg total) by mouth 2 (two) times daily. **DO NOT CRUSH**  . Flaxseed, Linseed, (FLAXSEED OIL) 1000 MG CAPS Take 1,000 mg by mouth daily.   [name] HYDROcodone-acetaminophen (NORCO/VICODIN) 5-325 MG tablet Take 1-2 tablets by mouth every 6 (six) hours as needed.  [name] ibuprofen (ADVIL) 600 MG tablet Take 1 tablet (600 mg total) by mouth every 8 (eight) hours as needed for moderate pain.  [name] losartan (COZAAR) 25 MG tablet TAKE 1  TABLET BY MOUTH EVERY DAY (Patient taking differently: Take 25 mg by mouth daily. )  . Melatonin 3 MG SUBL Place 4 mg under the tongue at bedtime.  . metoprolol tartrate (LOPRESSOR) 25 MG tablet TAKE 1 TABLET TWICE A DAY (Patient taking differently: Take 25 mg by mouth 2 (two) times daily. )  . Multiple Minerals (CALCIUM-MAGNESIUM-ZINC) TABS Take 1 tablet by mouth daily.  . Multiple Vitamin (MULTIVITAMIN WITH MINERALS) TABS tablet Take 1 tablet by mouth daily.  . progesterone (PROMETRIUM) 200 MG capsule Take 1 capsule (200 mg total) by mouth daily.  . RABEprazole (ACIPHEX) 20 MG tablet Take 20 mg by mouth 2 (two) times daily.   . rosuvastatin (CRESTOR) 20 MG tablet Take 1 tablet (20 mg total) by mouth daily.  [name] thyroid (NP THYROID) 15 MG tablet Take 3 tablets (45 mg total) by mouth daily.  . vitamin B-12 (CYANOCOBALAMIN) 1000 MCG tablet Take 1,000 mcg by mouth daily.  [name] zolpidem (AMBIEN) 5 MG tablet Take 1 tablet (5 mg total) by mouth at bedtime as needed for sleep.    BP 132/62   Pulse 84   Ht 5\' 6"  (1.676  m)   BMI 34.86 kg/m   Physical Exam Constitutional:      General: She is not in acute distress.    Appearance: She is well-developed.  Cardiovascular:     Comments: No peripheral edema Skin:    General: Skin is warm and dry.  Neurological:     Mental Status: She is alert and oriented to person, place, and time.     Sensory: No sensory deficit.     Coordination: Coordination normal.     Gait: Gait normal.     Deep Tendon Reflexes: Reflexes are normal and symmetric.     Ortho Exam Right foot third toe is pointing the wrong direction towards the fibula tenderness is noted feels very rigid like it will not correct with simple taping pain at the fracture site neurovascular exam of the toe is intact   MEDICAL DECISION MAKING  A.  Encounter Diagnosis  Name Primary?  . Closed displaced fracture of proximal phalanx of lesser toe of right foot, initial encounter Yes    B. DATA ANALYSED:   IMAGING: Interpretation of images: I interpret the image as a fracture of the third proximal phalanx of the right foot   Orders: No new orders Outside records reviewed: ER urgent care  C. MANAGEMENT   Closed reduction under local anesthesia  Patient gave consent for closed open third digit we used 1% lidocaine plain total of 8 cc divided on each side of the digit followed by manual pulling traction reversing of deformity to reduce the toe  Post reduction x-ray toe aligned now  Plan weight-bear as tolerated in cam walker  X-rays in 4 weeks  No orders of the defined types were placed in this encounter.     Arther Abbott, MD  12/15/2019 2:40 PM

## 2019-12-15 NOTE — Telephone Encounter (Signed)
Okay, I have put the order in for a bone density scan.  Please schedule.  Thanks.

## 2019-12-15 NOTE — Telephone Encounter (Signed)
12/15/19

## 2019-12-15 NOTE — Telephone Encounter (Signed)
Pt called stating she has broken her foot and she request that this referral be closed. She will get a new one when her foot gets better

## 2019-12-15 NOTE — Telephone Encounter (Signed)
Thank you :)

## 2019-12-15 NOTE — Telephone Encounter (Signed)
Call received from patient, relays re-injured her same (right) foot for which she had been seen and released per Dr Aline Brochure 12/08/19 - had Xrays done at [facility], [place]. I scheduled appointment for today, 12/15/19, in cancellation slot. Upon pulling up radiology report - "displaced" 5th metatarsal, right foot. Okay as scheduled, or other advice?

## 2019-12-15 NOTE — Telephone Encounter (Signed)
Done

## 2019-12-15 NOTE — Patient Instructions (Signed)
You can weight-bear as tolerated in the cam walker  X-rays in 2 weeks

## 2019-12-16 ENCOUNTER — Ambulatory Visit: Payer: BC Managed Care – PPO | Admitting: Cardiology

## 2019-12-16 ENCOUNTER — Encounter: Payer: Self-pay | Admitting: Cardiology

## 2019-12-16 VITALS — BP 102/66 | HR 64 | Ht 66.0 in | Wt 212.6 lb

## 2019-12-16 DIAGNOSIS — Z952 Presence of prosthetic heart valve: Secondary | ICD-10-CM | POA: Diagnosis not present

## 2019-12-16 DIAGNOSIS — I739 Peripheral vascular disease, unspecified: Secondary | ICD-10-CM

## 2019-12-16 DIAGNOSIS — E782 Mixed hyperlipidemia: Secondary | ICD-10-CM | POA: Diagnosis not present

## 2019-12-16 DIAGNOSIS — I10 Essential (primary) hypertension: Secondary | ICD-10-CM | POA: Diagnosis not present

## 2019-12-16 MED ORDER — ASPIRIN EC 81 MG PO TBEC: 81.0000 mg | DELAYED_RELEASE_TABLET | Freq: Every day | ORAL | 3 refills | Status: AC

## 2019-12-16 NOTE — Progress Notes (Signed)
Clinical Summary [name] is a 64 y.o.female  seen today for follow up of the following medicla problems  1. Aortic stenosis - hx in 1968 at age of 32 of supravavlular aortic stenosis, corrective surgery with patching at that time.  - recently found to have developed severe aortic valvular stenosis with possible recurrence of supravalvular stenosis as well based on imaging. 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  - Medtronic Freestyle stentless porcine valve/aortic root graft (size 21 mm, model # 995, serial # [id number])    09/2017 echo LVEF 65-70%, no WMAS, grade I diastolic dysfunction, normal AVR. - some mild LE edema at times, takes lasix prn and resolves - no SOB or DOE    2. PAD - followed by vascular, history of left external iliac stent.  -   3. HTN -she is compliant iwht meds -cough on ACE-I, discontinued - reported some insomina initialyl with losartan but unclear if truly related   - has lost 10 lbs since 01/2019 which looks to have helped her bp's - compliant with meds  4. HL - leg pains on lipitor, tolerating crestor 09/2019 TC 235 TG 252 LDL 144 - has repeat labs Sept 2nd, drastic diet changes.     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    Trip to [place] to visit her mother. Born and raised in [place]. Father passed in February. Enjoys riding motorcycles  Past Medical History:  Diagnosis Date  . Anxiety   . Aortic regurgitation 07/20/2014  . Aortic stenosis, severe 07/20/2014  . Arthritis   . DVT (deep venous thrombosis) ([place])   . Family history of adverse reaction to anesthesia    Reports father deliurm in his 2's with CABG  . GERD (gastroesophageal reflux disease)   .  History of pneumonia   . Hyperlipidemia   . Hypertension   . Insomnia, unspecified   . Muscle weakness (generalized)   . Peripheral artery disease ([place])   . 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 Sleep apnea    diagnosed multiple [facility]  . Supravalvular aortic stenosis, congenital - s/p repair during childhood      Allergies  Allergen Reactions  . Penicillins Hives    Has patient had a PCN reaction causing immediate rash, facial/tongue/throat swelling, SOB or lightheadedness with hypotension: Yes Has patient had a PCN reaction causing severe rash involving mucus membranes or skin necrosis: No Has patient had a PCN reaction that required hospitalization No Has patient had a PCN reaction occurring within the last 10 years: No If all of the above answers are "NO", then may proceed with Cephalosporin use.  Have taken Keflex before without any adverse reaction.   . Sulfa Antibiotics Nausea And Vomiting     Current Outpatient Medications  Medication Sig Dispense Refill  . Cholecalciferol (VITAMIN D3) 5000 UNITS TABS Take 10,000 Units by mouth every morning.     . clonazePAM (KLONOPIN) 0.5 MG tablet Take 1 tablet (0.5 mg total) by mouth 2 (two) times daily as needed for anxiety. 12 tablet 3  .  diclofenac (VOLTAREN) 75 MG EC tablet Take 1 tablet (75 mg total) by mouth 2 (two) times daily. **DO NOT CRUSH** 180 tablet 1  . Flaxseed, Linseed, (FLAXSEED OIL) 1000 MG CAPS Take 1,000 mg by mouth daily.     [name] HYDROcodone-acetaminophen (NORCO/VICODIN) 5-325 MG tablet Take 1-2 tablets by mouth every 6 (six) hours as needed. 20 tablet 0  . ibuprofen (ADVIL) 600 MG tablet Take 1 tablet (600 mg total) by mouth every 8 (eight) hours as needed for moderate pain. 30 tablet 0  . losartan (COZAAR) 25 MG tablet  TAKE 1 TABLET BY MOUTH EVERY DAY 30 tablet 0  . Melatonin 3 MG SUBL Place 4 mg under the tongue at bedtime.    . metoprolol tartrate (LOPRESSOR) 25 MG tablet TAKE 1 TABLET TWICE A DAY 180 tablet 3  . Multiple Minerals (CALCIUM-MAGNESIUM-ZINC) TABS Take 1 tablet by mouth daily.    . Multiple Vitamin (MULTIVITAMIN WITH MINERALS) TABS tablet Take 1 tablet by mouth daily.    . progesterone (PROMETRIUM) 200 MG capsule Take 1 capsule (200 mg total) by mouth daily. 30 capsule 0  . RABEprazole (ACIPHEX) 20 MG tablet Take 20 mg by mouth 2 (two) times daily.     . rosuvastatin (CRESTOR) 20 MG tablet Take 1 tablet (20 mg total) by mouth daily. 90 tablet 1  . thyroid (NP THYROID) 15 MG tablet Take 3 tablets (45 mg total) by mouth daily. 90 tablet 0  . vitamin B-12 (CYANOCOBALAMIN) 1000 MCG tablet Take 1,000 mcg by mouth daily.    [name] zolpidem (AMBIEN) 5 MG tablet Take 1 tablet (5 mg total) by mouth at bedtime as needed for sleep. 30 tablet 2  . furosemide (LASIX) 40 MG tablet Take 1 tablet (40 mg total) by mouth daily as needed for edema. (Patient taking differently: Take 40 mg by mouth daily as needed. ) 90 tablet 3   No current facility-administered medications for this visit.     Past Surgical History:  Procedure Laterality Date  . ABDOMINAL AORTAGRAM  06/24/12  . ABDOMINAL AORTAGRAM N/A 06/24/2012   Procedure: ABDOMINAL Maxcine Ham;  Surgeon: Angelia Mould, MD;  Location: [facility] CATH LAB;  Service: Cardiovascular;  Laterality: N/A;  . AORTIC VALVE REPLACEMENT N/A 10/07/2014   Procedure: REDO AORTIC VALVE REPLACEMENT (AVR);  Surgeon: Rexene Alberts, MD;  Location: [place];  Service: Open Heart Surgery;  Laterality: N/A;  . ASCENDING AORTIC ROOT REPLACEMENT N/A 10/07/2014   Procedure: ASCENDING AORTIC ROOT REPLACEMENT;  Surgeon: Rexene Alberts, MD;  Location: [place];  Service: Open Heart Surgery;  Laterality: N/A;  . BIOPSY  09/27/2016   Procedure: BIOPSY;  Surgeon: Daneil Dolin, MD;  [facility];  Service: Endoscopy;;  colon  . BREAST REDUCTION SURGERY Bilateral 01/21/2018   Procedure: BREAST REDUCTION WITH LIPOSUCTION;  Surgeon: Cristine Polio, MD;  Location: [place];  Service: Plastics;  Laterality: Bilateral;  . [place]  . CATARACT EXTRACTION W/PHACO Left 05/30/2019   Procedure: CATARACT EXTRACTION PHACO AND INTRAOCULAR LENS PLACEMENT (IOC) (CDE: 4.94  );  Surgeon: Baruch Goldmann, MD;  Location: [facility];  Service: Ophthalmology;  Laterality: Left;  . CATARACT EXTRACTION W/PHACO Right 06/16/2019   Procedure: CATARACT EXTRACTION PHACO AND INTRAOCULAR LENS PLACEMENT (IOC);  Surgeon: Baruch Goldmann, MD;  Location: [facility];  Service: Ophthalmology;  Laterality: Right;  CDE: 4.64  . CERVICAL FUSION    . CHOLECYSTECTOMY    . COLONOSCOPY WITH PROPOFOL N/A 09/27/2016  Dr. Gala Romney: Diverticulosis, several tubular adenomas removed ranging 4 to 7 mm in size, internal grade 1 hemorrhoids, terminal ileum normal, segmental biopsies negative for microscopic colitis.  Next colonoscopy June 2021  . ESOPHAGOGASTRODUODENOSCOPY (EGD) WITH PROPOFOL N/A 09/27/2016   Dr. Gala Romney: Small hiatal hernia, mild Schatzki ring status post disruption, LA grade a esophagitis  . ILIAC ARTERY STENT Left 12/2007  . KNEE ARTHROSCOPY WITH MEDIAL MENISECTOMY Right 01/10/2018   Procedure: RIGHT KNEE ARTHROSCOPY WITH PARTIAL MEDIAL MENISECTOMY;  Surgeon: Carole Civil, MD;  Location: [facility];  Service: Orthopedics;  Laterality: Right;  . LEFT AND RIGHT HEART CATHETERIZATION WITH CORONARY ANGIOGRAM N/A 07/31/2014   Procedure: LEFT AND RIGHT HEART CATHETERIZATION WITH CORONARY ANGIOGRAM;  Surgeon: Burnell Blanks, MD;  Location: [facility] CATH LAB;  Service: Cardiovascular;  Laterality: N/A;  . Venia Minks DILATION N/A 09/27/2016   Procedure: Venia Minks DILATION;  Surgeon: Daneil Dolin, MD;  [facility];  Service: Endoscopy;  Laterality: N/A;  . POLYPECTOMY  09/27/2016   Procedure:  POLYPECTOMY;  Surgeon: Daneil Dolin, MD;  [facility];  Service: Endoscopy;;  colon  . ROTATOR CUFF REPAIR Bilateral   . TEE WITHOUT CARDIOVERSION N/A 07/07/2014   Procedure: TRANSESOPHAGEAL ECHOCARDIOGRAM (TEE);  Surgeon: Arnoldo Lenis, MD;  [facility];  Service: Cardiology;  Laterality: N/A;  . TEE WITHOUT CARDIOVERSION N/A 07/20/2014   Procedure: TRANSESOPHAGEAL ECHOCARDIOGRAM (TEE) WITH PROPOFOL;  Surgeon: Arnoldo Lenis, MD;  Location[facility];  Service: Endoscopy;  Laterality: N/A;  . TEE WITHOUT CARDIOVERSION N/A 10/07/2014   Procedure: TRANSESOPHAGEAL ECHOCARDIOGRAM (TEE);  Surgeon: Rexene Alberts, MD;  Location: [place];  Service: Open Heart Surgery;  Laterality: N/A;     Allergies  Allergen Reactions  . Penicillins Hives    Has patient had a PCN reaction causing immediate rash, facial/tongue/throat swelling, SOB or lightheadedness with hypotension: Yes Has patient had a PCN reaction causing severe rash involving mucus membranes or skin necrosis: No Has patient had a PCN reaction that required hospitalization No Has patient had a PCN reaction occurring within the last 10 years: No If all of the above answers are "NO", then may proceed with Cephalosporin use.  Have taken Keflex before without any adverse reaction.   . Sulfa Antibiotics Nausea And Vomiting      Family History  Problem Relation Age of Onset  . Hypertension Mother   . Hypertension Father   . Heart disease Father        before age 31  . Other Father        varicose veins  . COPD Father   . Colon cancer Neg Hx   . Celiac disease Neg Hx   . Inflammatory bowel disease Neg Hx      Social History Ms. Holben reports that she has been smoking cigarettes. She has a 20.00 pack-year smoking history. She has never used smokeless tobacco. Ms. Pequignot reports current alcohol use of about 2.0 standard drinks of alcohol per week.   Review of Systems CONSTITUTIONAL: No weight loss, fever,  chills, weakness or fatigue.  HEENT: Eyes: No visual loss, blurred vision, double vision or yellow sclerae.No hearing loss, sneezing, congestion, runny nose or sore throat.  SKIN: No rash or itching.  CARDIOVASCULAR: per hpi RESPIRATORY: No shortness of breath, cough or sputum.  GASTROINTESTINAL: No anorexia, nausea, vomiting or diarrhea. No abdominal pain or blood.  GENITOURINARY: No burning on urination, no polyuria NEUROLOGICAL: No headache, dizziness, syncope, paralysis, ataxia, numbness or  tingling in the extremities. No change in bowel or bladder control.  MUSCULOSKELETAL: No muscle, back pain, joint pain or stiffness.  LYMPHATICS: No enlarged nodes. No history of splenectomy.  PSYCHIATRIC: No history of depression or anxiety.  ENDOCRINOLOGIC: No reports of sweating, cold or heat intolerance. No polyuria or polydipsia.  [name]   Physical Examination Vitals:   12/16/19 0826  BP: 102/66  Pulse: 64  SpO2: 98%   Filed Weights   12/16/19 0826  Weight: 212 lb 9.6 oz (96.4 kg)    Gen: resting comfortably, no acute distress HEENT: no scleral icterus, pupils equal round and reactive, no palptable cervical adenopathy,  CV: RRR, no m/r/g, no jvd Resp: Clear to auscultation bilaterally GI: abdomen is soft, non-tender, non-distended, normal bowel sounds, no hepatosplenomegaly MSK: extremities are warm, no edema.  Skin: warm, no rash Neuro:  no focal deficits Psych: appropriate affect   Diagnostic Studies 11/2015 echo Study Conclusions  - Left ventricle: The cavity size was normal. Wall thickness was normal. Systolic function was vigorous. The estimated ejection fraction was in the range of 65% to 70%. Wall motion was normal; there were no regional wall motion abnormalities. Doppler parameters are consistent with abnormal left ventricular relaxation (grade 1 diastolic dysfunction). - Aortic valve: 21 mm Medtronic Freestyle stentless porcine valve/aortic root graft in  place. There was no significant regurgitation. Peak gradient (S): 19 mm Hg. Valve area (Vmax): 1.83 cm^2. - Mitral valve: Calcified annulus. There was trivial regurgitation. - Right ventricle: The cavity size was mildly dilated. - Right atrium: Central venous pressure (est): 3 mm Hg. - Atrial septum: The septum bowed from right to left, consistent with increased right atrial pressure. - Tricuspid valve: There was mild regurgitation. - Pulmonic valve: Peak gradient (S): 19 mm Hg. - Pulmonary arteries: PA peak pressure: 21 mm Hg (S). - Pericardium, extracardiac: There was no pericardial effusion.  Impressions:  - Normal LV wall thickness with LVEF 65-70%. Grade 1 diastolic dysfunction. MAC with trivial mitral regurgitation. Bioprosthesis in aortic position as described above with no significant perivalvular regurgitation and peak gradient 19 mmHg. Mild RV enlargement. Mild tricuspid regurgitation with PASP estimated 21 mmHg.  07/2014 cath Hemodynamic Findings: Ao: 107/55  LV: 190/12/15 RA: 1 RV: 41/2/6 PA: 26/6 (mean 15)  PCWP: 7 Fick Cardiac Output: 4.76 L/min Fick Cardiac Index: 2.38 L/min/m2 Central Aortic Saturation: 96% Pulmonary Artery Saturation: 66%  Aortic valve data: Peak to peak gradient 83 mm Hg Mean gradient 56 mmHg AVA 0.52 cm2  Angiographic Findings:  Left main: Normal caliber vessel with no obstructive disease.   Left Anterior Descending Artery: Large caliber vessel that courses to the apex. Moderate caliber diagonal Zeba Luby. No obstructive disease.   Circumflex Artery: Large caliber vessel with moderate caliber obtuse marginal Jennifer Payes. No obstructive disease.   Right Coronary Artery: Large dominant vessel with no obstructive disease.   Left Ventricular Angiogram: LVEF=60%  Aortic root angiogram:The aortic root is not enlarged. There is supravalvular density.   Aortic valve calcification noted  on plain fluoroscopy.   Impression: 1. No angiographic evidence of CAD 2. Normal LV systolic function 3. Severe gradient across the aortic valve into the aorta with density noted in the aortic root. Cannot exclude recurrent supravalvular stenosis along with aortic valve stenosis. 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  4. Normal filling pressures   09/2017 echo Study Conclusions  - Left ventricle: The cavity size was normal. Wall thickness was normal. Systolic function was vigorous. The  estimated ejection fraction was in the range of 65% to 70%. Wall motion was normal; there were no regional wall motion abnormalities. Doppler parameters are consistent with abnormal left ventricular relaxation (grade 1 diastolic dysfunction). Doppler parameters are consistent with indeterminate ventricular filling pressure. - Aortic valve: 21 mm Medtronic Freestyle stentless porcine valve/aortic root graft in place. There was no significant regurgitation. There was no stenosis. Peak gradient (S): 11 mm Hg. - Mitral valve: Mildly calcified annulus. Normal thickness leaflets . - Tricuspid valve: There was mild regurgitation. - Pulmonary arteries: PA peak pressure: 35 mm Hg (S).    Assessment and Plan   1. Aortic stenosis - s/p tissue AVR and aortic root graft placement - 2019 echo shows normal LVEF, normal AVR, - denies any recent symptoms, continue to monitor - restart her ASA 61m which she had stopped   2. HTN -at goal, continue current meds   3. Hyperlipidemia - has not been at goal - upcoming labs with pcp, goal LDL given PAD would be LDL <70. If not at goal would increase crestor to 435mdaily.   4. PAD - she will continue medical therapy.   F/u 1 year    JoArnoldo LenisM.D

## 2019-12-16 NOTE — Patient Instructions (Signed)
Medication Instructions:  Your physician recommends that you continue on your current medications as directed. Please refer to the Current Medication list given to you today.  Start Aspirin 81 mg Daily   *If you need a refill on your cardiac medications before your next appointment, please call your pharmacy*   Lab Work: NONE   If you have labs (blood work) drawn today and your tests are completely normal, you will receive your results only by: [name] MyChart Message (if you have MyChart) OR . A paper copy in the mail If you have any lab test that is abnormal or we need to change your treatment, we will call you to review the results.   Testing/Procedures: NONE    Follow-Up: At [facility], you and your health needs are our priority.  As part of our continuing mission to provide you with exceptional heart care, we have created designated Provider Care Teams.  These Care Teams include your primary Cardiologist (physician) and Advanced Practice Providers (APPs -  Physician Assistants and Nurse Practitioners) who all work together to provide you with the care you need, when you need it.  We recommend signing up for the patient portal called "MyChart".  Sign up information is provided on this After Visit Summary.  MyChart is used to connect with patients for Virtual Visits (Telemedicine).  Patients are able to view lab/test results, encounter notes, upcoming appointments, etc.  Non-urgent messages can be sent to your provider as well.   To learn more about what you can do with MyChart, go to [email or web address].    Your next appointment:   1 year(s)  The format for your next appointment:   In Person  Provider:   Carlyle Dolly, MD   Other Instructions Thank you for choosing [facility]!

## 2019-12-17 ENCOUNTER — Ambulatory Visit (INDEPENDENT_AMBULATORY_CARE_PROVIDER_SITE_OTHER): Payer: BC Managed Care – PPO | Admitting: Internal Medicine

## 2019-12-17 ENCOUNTER — Encounter (INDEPENDENT_AMBULATORY_CARE_PROVIDER_SITE_OTHER): Payer: Self-pay | Admitting: Internal Medicine

## 2019-12-17 ENCOUNTER — Ambulatory Visit: Payer: BC Managed Care – PPO | Admitting: Gastroenterology

## 2019-12-17 ENCOUNTER — Encounter: Payer: Self-pay | Admitting: Gastroenterology

## 2019-12-17 ENCOUNTER — Other Ambulatory Visit: Payer: Self-pay

## 2019-12-17 VITALS — BP 133/80 | HR 73 | Temp 97.5°F | Resp 18 | Ht 66.0 in | Wt 212.0 lb

## 2019-12-17 VITALS — BP 112/59 | HR 69 | Temp 97.0°F | Ht 66.0 in | Wt 212.0 lb

## 2019-12-17 DIAGNOSIS — N951 Menopausal and female climacteric states: Secondary | ICD-10-CM | POA: Diagnosis not present

## 2019-12-17 DIAGNOSIS — I10 Essential (primary) hypertension: Secondary | ICD-10-CM

## 2019-12-17 DIAGNOSIS — E669 Obesity, unspecified: Secondary | ICD-10-CM | POA: Diagnosis not present

## 2019-12-17 DIAGNOSIS — Z86010 Personal history of colonic polyps: Secondary | ICD-10-CM | POA: Diagnosis not present

## 2019-12-17 DIAGNOSIS — E782 Mixed hyperlipidemia: Secondary | ICD-10-CM

## 2019-12-17 LAB — T3, FREE: T3, Free: 2.9 pg/mL (ref 2.3–4.2)

## 2019-12-17 LAB — PROGESTERONE: Progesterone: 6 ng/mL

## 2019-12-17 LAB — ESTRADIOL: Estradiol: <15 pg/mL

## 2019-12-17 MED ORDER — RABEPRAZOLE SODIUM 20 MG PO TBEC: 20.0000 mg | DELAYED_RELEASE_TABLET | Freq: Two times a day (BID) | ORAL | 3 refills | Status: DC

## 2019-12-17 MED ORDER — PROGESTERONE 200 MG PO CAPS
200.0000 mg | ORAL_CAPSULE | Freq: Every day | ORAL | 1 refills | Status: DC
Start: 2019-12-17 — End: 2020-03-02

## 2019-12-17 NOTE — Progress Notes (Signed)
Primary Care Physician:  Doree Albee, MD  Primary Gastroenterologist:  Garfield Cornea, MD   Chief Complaint  Patient presents with   Colonoscopy    recall    HPI:  Michelle Patton is a 64 y.o. female with history of peripheral artery disease, hypertension, DVT, sleep apnea, aortic stenosis status post redo aortic root replacement with stentless porcine aortic root graft 2016 here to schedule surveillance colonoscopy.  She was last seen in September 2020 for GERD and diarrhea.  EGD and colonoscopy June 2018.  She had small hiatal hernia, mild Schatzki ring at the GE junction, required biopsy forceps for disruption.  LA grade A esophagitis noted.  Colonoscopy revealed diverticulosis, multiple semi-pedunculated polyps, ranging 4 to 7 mm in size were tubular adenomas, internal hemorrhoids grade 1, terminal ileum normal, segmental biopsies of the colon to evaluate for microscopic colitis were negative.  Recommended next colonoscopy June 2021.  Doing well from a GI standpoint.  Reflux well controlled on AcipHex twice daily.  She is trying to lose some weight.  Has had some setbacks with broken foot.  Recently had a closed nondisplaced fracture of the fifth metatarsal bone on the right foot.  Was seen this week by Dr. Aline Brochure for a new injury and found to have a closed displaced fracture of the proximal phalanx of the lesser toe of the right foot.  She hopes when she is able to be more active and lose some weight that she can cut back on her AcipHex to once daily.  Currently symptoms are well controlled on twice daily.  No dysphagia.  No abdominal pain.  Bowel movements are regular.  No blood in stool or melena.   Current Outpatient Medications  Medication Sig Dispense Refill   aspirin EC 81 MG tablet Take 1 tablet (81 mg total) by mouth daily. Swallow whole. 90 tablet 3   Cholecalciferol (VITAMIN D3) 5000 UNITS TABS Take 10,000 Units by mouth every morning.      diclofenac (VOLTAREN) 75 MG EC  tablet Take 1 tablet (75 mg total) by mouth 2 (two) times daily. **DO NOT CRUSH** 180 tablet 1   Flaxseed, Linseed, (FLAXSEED OIL) 1000 MG CAPS Take 1,000 mg by mouth daily.      furosemide (LASIX) 40 MG tablet Take 1 tablet (40 mg total) by mouth daily as needed for edema. (Patient taking differently: Take 40 mg by mouth daily as needed. ) 90 tablet 3   HYDROcodone-acetaminophen (NORCO/VICODIN) 5-325 MG tablet Take 1-2 tablets by mouth every 6 (six) hours as needed. 20 tablet 0   ibuprofen (ADVIL) 600 MG tablet Take 1 tablet (600 mg total) by mouth every 8 (eight) hours as needed for moderate pain. 30 tablet 0   losartan (COZAAR) 25 MG tablet TAKE 1 TABLET BY MOUTH EVERY DAY 30 tablet 0   Melatonin 3 MG SUBL Place 4 mg under the tongue at bedtime.     metoprolol tartrate (LOPRESSOR) 25 MG tablet TAKE 1 TABLET TWICE A DAY 180 tablet 3   Multiple Minerals (CALCIUM-MAGNESIUM-ZINC) TABS Take 1 tablet by mouth daily.     Multiple Vitamin (MULTIVITAMIN WITH MINERALS) TABS tablet Take 1 tablet by mouth daily.     progesterone (PROMETRIUM) 200 MG capsule Take 1 capsule (200 mg total) by mouth daily. 90 capsule 1   RABEprazole (ACIPHEX) 20 MG tablet Take 20 mg by mouth 2 (two) times daily.      rosuvastatin (CRESTOR) 20 MG tablet Take 1 tablet (20 mg total) by mouth  daily. 90 tablet 1   thyroid (NP THYROID) 15 MG tablet Take 3 tablets (45 mg total) by mouth daily. 90 tablet 0   vitamin B-12 (CYANOCOBALAMIN) 1000 MCG tablet Take 1,000 mcg by mouth daily.     zolpidem (AMBIEN) 5 MG tablet Take 1 tablet (5 mg total) by mouth at bedtime as needed for sleep. 30 tablet 2   No current facility-administered medications for this visit.    Allergies as of 12/17/2019 - Review Complete 12/17/2019  Allergen Reaction Noted   Penicillins Hives    Sulfa antibiotics Nausea And Vomiting 06/19/2012    Past Medical History:  Diagnosis Date   Anxiety    Aortic regurgitation 07/20/2014   Aortic  stenosis, severe 07/20/2014   Arthritis    DVT (deep venous thrombosis) (HCC)    Family history of adverse reaction to anesthesia    Reports father deliurm in his 48's with CABG   GERD (gastroesophageal reflux disease)    History of pneumonia    Hyperlipidemia    Hypertension    Insomnia, unspecified    Muscle weakness (generalized)    Peripheral artery disease (HCC)    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   Redo sternotomy for 21 mm Medtronic Freestyle porcine aortic root graft w/ reimplantation of left main and right coronary arteries   Sleep apnea    diagnosed multiple years ago at [facility] aortic stenosis, congenital - s/p repair during childhood     Past Surgical History:  Procedure Laterality Date   ABDOMINAL AORTAGRAM  06/24/12   ABDOMINAL AORTAGRAM N/A 06/24/2012   Procedure: ABDOMINAL Maxcine Ham;  Surgeon: Angelia Mould, MD;  Location: [facility] CATH LAB;  Service: Cardiovascular;  Laterality: N/A;   AORTIC VALVE REPLACEMENT N/A 10/07/2014   Procedure: REDO AORTIC VALVE REPLACEMENT (AVR);  Surgeon: Rexene Alberts, MD;  Location: [place];  Service: Open Heart Surgery;  Laterality: N/A;   ASCENDING AORTIC ROOT REPLACEMENT N/A 10/07/2014   Procedure: ASCENDING AORTIC ROOT REPLACEMENT;  Surgeon: Rexene Alberts, MD;  Location: [place];  Service: Open Heart Surgery;  Laterality: N/A;   BIOPSY  09/27/2016   Procedure: BIOPSY;  Surgeon: Daneil Dolin, MD;  [facility];  Service: Endoscopy;;  colon   BREAST REDUCTION SURGERY Bilateral 01/21/2018   Procedure: BREAST REDUCTION WITH LIPOSUCTION;  Surgeon: Cristine Polio, MD;  Location: [place];  Service: Plastics;  Laterality: Bilateral;   CARDIAC VALVE SURGERY  1968   CATARACT EXTRACTION W/PHACO Left 05/30/2019   Procedure: CATARACT EXTRACTION PHACO AND INTRAOCULAR LENS PLACEMENT (IOC) (CDE: 4.94  );  Surgeon: Baruch Goldmann, MD;   Location: [facility];  Service: Ophthalmology;  Laterality: Left;   CATARACT EXTRACTION W/PHACO Right 06/16/2019   Procedure: CATARACT EXTRACTION PHACO AND INTRAOCULAR LENS PLACEMENT (IOC);  Surgeon: Baruch Goldmann, MD;  Location: [facility];  Service: Ophthalmology;  Laterality: Right;  CDE: 4.64   CERVICAL FUSION     CHOLECYSTECTOMY     COLONOSCOPY WITH PROPOFOL N/A 09/27/2016   Dr. Gala Romney: Diverticulosis, several tubular adenomas removed ranging 4 to 7 mm in size, internal grade 1 hemorrhoids, terminal ileum normal, segmental biopsies negative for microscopic colitis.  Next colonoscopy June 2021   ESOPHAGOGASTRODUODENOSCOPY (EGD) WITH PROPOFOL N/A 09/27/2016   Dr. Gala Romney: Small hiatal hernia, mild Schatzki ring status post disruption, LA grade a esophagitis   ILIAC ARTERY STENT Left 12/2007   KNEE ARTHROSCOPY WITH MEDIAL MENISECTOMY Right 01/10/2018  Procedure: RIGHT KNEE ARTHROSCOPY WITH PARTIAL MEDIAL MENISECTOMY;  Surgeon: Carole Civil, MD;  Location: [facility];  Service: Orthopedics;  Laterality: Right;   LEFT AND RIGHT HEART CATHETERIZATION WITH CORONARY ANGIOGRAM N/A 07/31/2014   Procedure: LEFT AND RIGHT HEART CATHETERIZATION WITH CORONARY ANGIOGRAM;  Surgeon: Burnell Blanks, MD;  Location: [facility] CATH LAB;  Service: Cardiovascular;  Laterality: N/A;   MALONEY DILATION N/A 09/27/2016   Procedure: Venia Minks DILATION;  Surgeon: Daneil Dolin, MD;  [facility];  Service: Endoscopy;  Laterality: N/A;   POLYPECTOMY  09/27/2016   Procedure: POLYPECTOMY;  Surgeon: Daneil Dolin, MD;  [facility];  Service: Endoscopy;;  colon   ROTATOR CUFF REPAIR Bilateral    TEE WITHOUT CARDIOVERSION N/A 07/07/2014   Procedure: TRANSESOPHAGEAL ECHOCARDIOGRAM (TEE);  Surgeon: Arnoldo Lenis, MD;  [facility];  Service: Cardiology;  Laterality: N/A;   TEE WITHOUT CARDIOVERSION N/A 07/20/2014   Procedure: TRANSESOPHAGEAL ECHOCARDIOGRAM (TEE) WITH PROPOFOL;  Surgeon:  Arnoldo Lenis, MD;  Location: [facility];  Service: Endoscopy;  Laterality: N/A;   TEE WITHOUT CARDIOVERSION N/A 10/07/2014   Procedure: TRANSESOPHAGEAL ECHOCARDIOGRAM (TEE);  Surgeon: Rexene Alberts, MD;  Location: [place];  Service: Open Heart Surgery;  Laterality: N/A;    Family History  Problem Relation Age of Onset   Hypertension Mother    Hypertension Father    Heart disease Father        before age 58   Other Father        varicose veins   COPD Father    Colon cancer Neg Hx    Celiac disease Neg Hx    Inflammatory bowel disease Neg Hx     Social History   Socioeconomic History   Marital status: Married    Spouse name: Not on file   Number of children: 0   Years of education: some coll   Highest education level: Not on file  Occupational History   Occupation: call center    Employer: [employer]    Comment: [employer]  Tobacco Use   Smoking status: Current Every Day Smoker    Packs/day: 0.50    Years: 40.00    Pack years: 20.00    Types: Cigarettes   Smokeless tobacco: Never Used  Vaping Use   Vaping Use: Never used  Substance and Sexual Activity   Alcohol use: Yes    Alcohol/week: 2.0 standard drinks    Types: 2 Cans of beer per week   Drug use: No   Sexual activity: Yes    Birth control/protection: None  Other Topics Concern   Not on file  Social History Narrative   Patient is married   for 5 years . Patient works at [employer].. Some college education-did not Writer.Originally from [place].   Social Determinants of Health   Financial Resource Strain:    Difficulty of Paying Living Expenses: Not on file  Food Insecurity:    Worried About Charity fundraiser in the Last Year: Not on file   [employer] of Food in the Last Year: Not on file  Transportation Needs:    Lack of Transportation (Medical): Not on file   Lack of Transportation (Non-Medical): Not on file  Physical Activity:    Days of Exercise per Week: Not on  file   Minutes of Exercise per Session: Not on file  Stress:    Feeling of Stress : Not on file  Social Connections:  Frequency of Communication with Friends and Family: Not on file   Frequency of Social Gatherings with Friends and Family: Not on file   Attends Religious Services: Not on file   Active Member of Clubs or Organizations: Not on file   Attends Archivist Meetings: Not on file   Marital Status: Not on file  Intimate Partner Violence:    Fear of Current or Ex-Partner: Not on file   Emotionally Abused: Not on file   Physically Abused: Not on file   Sexually Abused: Not on file      ROS:  General: Negative for anorexia, weight loss, fever, chills, fatigue, weakness. Eyes: Negative for vision changes.  ENT: Negative for hoarseness, difficulty swallowing , nasal congestion. CV: Negative for chest pain, angina, palpitations, dyspnea on exertion, peripheral edema.  Respiratory: Negative for dyspnea at rest, dyspnea on exertion, cough, sputum, wheezing.  GI: See history of present illness. GU:  Negative for dysuria, hematuria, urinary incontinence, urinary frequency, nocturnal urination.  MS: right foot pain,no low back pain.  Derm: Negative for rash or itching.  Neuro: Negative for weakness, abnormal sensation, seizure, frequent headaches, memory loss, confusion.  Psych: Negative for anxiety, depression, suicidal ideation, hallucinations.  Endo: Negative for unusual weight change.  Heme: Negative for bruising or bleeding. Allergy: Negative for rash or hives.    Physical Examination:  BP (!) 112/59    Pulse 69    Temp (!) 97 F (36.1 C) (Temporal)    Ht 5\' 6"  (1.676 m)    Wt 212 lb (96.2 kg)    BMI 34.22 kg/m    General: Well-nourished, well-developed in no acute distress.  Head: Normocephalic, atraumatic.   Eyes: Conjunctiva pink, no icterus. Mouth: masked Neck: Supple without thyromegaly, masses, or lymphadenopathy.  Lungs: Clear to  auscultation bilaterally.  Heart: Regular rate and rhythm, no murmurs rubs or gallops.  Abdomen: Bowel sounds are normal, nontender, nondistended, no hepatosplenomegaly or masses, no abdominal bruits or    hernia , no rebound or guarding.   Rectal: not performed Extremities: No lower extremity edema. No clubbing or deformities.  Neuro: Alert and oriented x 4 , grossly normal neurologically.  Skin: Warm and dry, no rash or jaundice.   Psych: Alert and cooperative, normal mood and affect.  Labs: Lab Results  Component Value Date   CREATININE 1.10 (H) 09/24/2019   BUN 21 09/24/2019   NA 138 09/24/2019   K 3.9 09/24/2019   CL 98 09/24/2019   CO2 28 09/24/2019   Lab Results  Component Value Date   ALT 27 09/24/2019   AST 24 09/24/2019   ALKPHOS 48 10/26/2018   BILITOT 0.5 09/24/2019   Lab Results  Component Value Date   WBC 7.8 09/24/2019   HGB 15.0 09/24/2019   HCT 44.4 09/24/2019   MCV 89.5 09/24/2019   PLT 279 09/24/2019   Lab Results  Component Value Date   TSH 3.23 09/24/2019   Lab Results  Component Value Date   HGBA1C 5.5 09/24/2019     Imaging Studies: DG Foot Complete Right  Result Date: 12/13/2019 CLINICAL DATA:  Right foot fracture of the fifth metatarsal in May of 2021. Recent injury and deformity. EXAM: RIGHT FOOT COMPLETE - 3+ VIEW COMPARISON:  Sep 09, 2019, October 23, 2019, and December 05, 2019 FINDINGS: The patient has known fifth metatarsal fracture is similar since October 23, 2019. There does appear to be some developing callus in this location. There is a new displaced fracture through  the proximal third phalanx. No other acute abnormalities. IMPRESSION: 1. New displaced fracture through the proximal third phalanx. 2. Healing fracture of the fifth metatarsal. Electronically Signed   By: Dorise Bullion III M.D   On: 12/13/2019 15:45   DG Foot Complete Right  Result Date: 12/05/2019 Right foot x-rays x3 right foot fracture X-rays show fracture is stable no  complete resolution of fracture line Impression stable fracture right foot  DG Toe 3rd Right  Result Date: 12/15/2019 DG Toe 3rd Right Post reduction films 3rd toe injury Near anatomic reduction of the digit Impression improved alignment post reduction third toe proximal phalanx right foot  Impression/Plan: Pleasant 64 year old female with history of adenomatous colon polyps due for surveillance colonoscopy at this time.  Plan for colonoscopy with Dr. Gala Romney with propofol. ASA II.  I have discussed the risks, alternatives, benefits with regards to but not limited to the risk of reaction to medication, bleeding, infection, perforation and the patient is agreeable to proceed. Written consent to be obtained.  GERD is reasonably well controlled on AcipHex 20 mg twice daily.  Goal of reducing dose to once daily at some point in the future when patient is able to lose 10 to 15 pounds, and with avoidance of trigger foods.

## 2019-12-17 NOTE — Patient Instructions (Signed)
1. Continue AcipHex 20 mg twice daily before meals.  With weight reduction and dietary changes, goal would be to get down to once daily dosing and still be able to manage your symptoms. 2. Colonoscopy to be scheduled.  Please see separate instructions.

## 2019-12-17 NOTE — Progress Notes (Signed)
Metrics: Intervention Frequency ACO  Documented Smoking Status Yearly  Screened one or more times in 24 months  Cessation Counseling or  Active cessation medication Past 24 months  Past 24 months   Guideline developer: UpToDate (See UpToDate for funding source) Date Released: 2014       Wellness Office Visit  Subjective:  Patient ID: Michelle Patton, female    DOB: 1955-09-12  Age: 65 y.o. MRN: [id number]  CC: This lady comes in for follow-up of obesity, hypertension, menopausal symptoms and hyperlipidemia. HPI  She has tolerated the thyroid medication that I put her on and seems to have given her more energy. She is doing well with progesterone only at night for hot flashes. I have not started estradiol on her yet. She continues on losartan, metoprolol for hypertension and also continues on statin therapy for hyperlipidemia. She is trying hard to continue to maintain and be consistent with nutrition that we have discussed previously.  She does not eat much animal protein except maybe once a week.  She typically does intermittent fasting almost on a daily basis. Past Medical History:  Diagnosis Date  . Anxiety   . Aortic regurgitation 07/20/2014  . Aortic stenosis, severe 07/20/2014  . Arthritis   . DVT (deep venous thrombosis) ([place])   . Family history of adverse reaction to anesthesia    Reports father deliurm in his 64's with CABG  . GERD (gastroesophageal reflux disease)   . History of pneumonia   . Hyperlipidemia   . Hypertension   . Insomnia, unspecified   . Muscle weakness (generalized)   . Peripheral artery disease ([place])   . 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 Sleep apnea    diagnosed multiple years ago at [facility]  . Supravalvular aortic stenosis, congenital - s/p repair during childhood     Past Surgical History:  Procedure Laterality Date  . ABDOMINAL AORTAGRAM  06/24/12  . ABDOMINAL AORTAGRAM N/A 06/24/2012   Procedure: ABDOMINAL Maxcine Ham;  Surgeon: Angelia Mould, MD;  Location: [facility] C[facility] LAB;  Service: Cardiovascular;  Laterality: N/A;  . AORTIC VALVE REPLACEMENT N/A 10/07/2014   Procedure: REDO AORTIC VALVE REPLACEMENT (AVR);  Surgeon: Rexene Alberts, MD;  Location: [place];  Service: Open Heart Surgery;  Laterality: N/A;  . ASCENDING AORTIC ROOT REPLACEMENT N/A 10/07/2014   Procedure: ASCENDING AORTIC ROOT REPLACEMENT;  Surgeon: Rexene Alberts, MD;  Location: [place];  Service: Open Heart Surgery;  Laterality: N/A;  . BIOPSY  09/27/2016   Procedure: BIOPSY;  Surgeon: Daneil Dolin, MD;  [facility];  Service: Endoscopy;;  colon  . BREAST REDUCTION SURGERY Bilateral 01/21/2018   Procedure: BREAST REDUCTION WITH LIPOSUCTION;  Surgeon: Cristine Polio, MD;  Location: [place];  Service: Plastics;  Laterality: Bilateral;  . [place]  . CATARACT EXTRACTION W/PHACO Left 05/30/2019   Procedure: CATARACT EXTRACTION PHACO AND INTRAOCULAR LENS PLACEMENT (IOC) (CDE: 4.94  );  Surgeon: Baruch Goldmann, MD;  [facility];  Service: Ophthalmology;  Laterality: Left;  . CATARACT EXTRACTION W/PHACO Right 06/16/2019   Procedure: CATARACT EXTRACTION PHACO AND INTRAOCULAR LENS PLACEMENT (IOC);  Surgeon: Baruch Goldmann, MD;  Location: [facility];  Service: Ophthalmology;  Laterality: Right;  CDE: 4.64  . CERVICAL FUSION    . CHOLECYSTECTOMY    . COLONOSCOPY  WITH PROPOFOL N/A 09/27/2016   Dr. Gala Romney: Diverticulosis, several tubular adenomas removed ranging 4 to 7 mm in size, internal grade 1 hemorrhoids, terminal ileum normal, segmental biopsies negative for microscopic colitis.  Next colonoscopy June 2021  . ESOPHAGOGASTRODUODENOSCOPY (EGD) WITH PROPOFOL N/A 09/27/2016   Dr. Gala Romney: Small hiatal hernia, mild Schatzki ring status post disruption, LA  grade a esophagitis  . ILIAC ARTERY STENT Left 12/2007  . KNEE ARTHROSCOPY WITH MEDIAL MENISECTOMY Right 01/10/2018   Procedure: RIGHT KNEE ARTHROSCOPY WITH PARTIAL MEDIAL MENISECTOMY;  Surgeon: Carole Civil, MD;  [facility];  Service: Orthopedics;  Laterality: Right;  . LEFT AND RIGHT HEART CATHETERIZATION WITH CORONARY ANGIOGRAM N/A 07/31/2014   Procedure: LEFT AND RIGHT HEART CATHETERIZATION WITH CORONARY ANGIOGRAM;  Surgeon: Burnell Blanks, MD;  Location[facility] CATH LAB;  Service: Cardiovascular;  Laterality: N/A;  . Venia Minks DILATION N/A 09/27/2016   Procedure: Venia Minks DILATION;  Surgeon: Daneil Dolin, MD;  [facility];  Service: Endoscopy;  Laterality: N/A;  . POLYPECTOMY  09/27/2016   Procedure: POLYPECTOMY;  Surgeon: Daneil Dolin, MD;  [facility];  Service: Endoscopy;;  colon  . ROTATOR CUFF REPAIR Bilateral   . TEE WITHOUT CARDIOVERSION N/A 07/07/2014   Procedure: TRANSESOPHAGEAL ECHOCARDIOGRAM (TEE);  Surgeon: Arnoldo Lenis, MD;  [facility];  Service: Cardiology;  Laterality: N/A;  . TEE WITHOUT CARDIOVERSION N/A 07/20/2014   Procedure: TRANSESOPHAGEAL ECHOCARDIOGRAM (TEE) WITH PROPOFOL;  Surgeon: Arnoldo Lenis, MD;  [facility];  Service: Endoscopy;  Laterality: N/A;  . TEE WITHOUT CARDIOVERSION N/A 10/07/2014   Procedure: TRANSESOPHAGEAL ECHOCARDIOGRAM (TEE);  Surgeon: Rexene Alberts, MD;  Location: [place];  Service: Open Heart Surgery;  Laterality: N/A;     Family History  Problem Relation Age of Onset  . Hypertension Mother   . Hypertension Father   . Heart disease Father        before age 27  . Other Father        varicose veins  . COPD Father   . Colon cancer Neg Hx   . Celiac disease Neg Hx   . Inflammatory bowel disease Neg Hx     Social History   Social History Narrative   Patient is married   for 5 years . 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   Social History   Tobacco Use  . Smoking status: Current Every Day Smoker    Packs/day: 0.50    Years: 40.00    Pack years: 20.00    Types: Cigarettes  . Smokeless tobacco: Never Used  Substance Use Topics  . Alcohol use: Yes    Alcohol/week: 2.0 standard drinks    Types: 2 Cans of beer per week    Current Meds  Medication Sig  . Cholecalciferol (VITAMIN D3) 5000 UNITS TABS Take 10,000 Units by mouth every morning.   . diclofenac (VOLTAREN) 75 MG EC tablet Take 1 tablet (75 mg total) by mouth 2 (two) times daily. **DO NOT CRUSH**  . Flaxseed, Linseed, (FLAXSEED OIL) 1000 MG CAPS Take 1,000 mg by mouth daily.   . furosemide (LASIX) 40 MG tablet Take 1 tablet (40 mg total) by mouth daily as needed for edema. (Patient taking differently: Take 40 mg by mouth daily as needed. )  . HYDROcodone-acetaminophen (NORCO/VICODIN) 5-325 MG tablet Take 1-2 tablets by mouth every 6 (six) hours as needed.  [name] ibuprofen (ADVIL) 600 MG tablet  Take 1 tablet (600 mg total) by mouth every 8 (eight) hours as needed for moderate pain.  [name] losartan (COZAAR) 25 MG tablet TAKE 1 TABLET BY MOUTH EVERY DAY  . Melatonin 3 MG SUBL Place 4 mg under the tongue at bedtime.  . metoprolol tartrate (LOPRESSOR) 25 MG tablet TAKE 1 TABLET TWICE A DAY  . Multiple Minerals (CALCIUM-MAGNESIUM-ZINC) TABS Take 1 tablet by mouth daily.  . Multiple Vitamin (MULTIVITAMIN WITH MINERALS) TABS tablet Take 1 tablet by mouth daily.  . progesterone (PROMETRIUM) 200 MG capsule Take 1 capsule (200 mg total) by mouth daily.  . RABEprazole (ACIPHEX) 20 MG tablet Take 20 mg by mouth 2 (two) times daily.   . rosuvastatin (CRESTOR) 20 MG tablet Take 1 tablet (20 mg total) by mouth daily.  [name] thyroid (NP THYROID) 15 MG tablet Take 3 tablets (45 mg total) by mouth daily.  . vitamin B-12 (CYANOCOBALAMIN) 1000 MCG tablet Take 1,000 mcg by mouth daily.  [name] zolpidem (AMBIEN) 5 MG tablet Take 1 tablet (5 mg  total) by mouth at bedtime as needed for sleep.  . [DISCONTINUED] progesterone (PROMETRIUM) 200 MG capsule Take 1 capsule (200 mg total) by mouth daily.       Depression screen [facility] 2/9 07/16/2019 04/07/2019 12/09/2018 11/04/2014  Decreased Interest 0 0 0 0  Down, Depressed, Hopeless 0 0 0 1  PHQ - 2 Score 0 0 0 1  Some recent data might be hidden     Objective:   Today's Vitals: BP 133/80 (BP Location: Right Arm, Patient Position: Sitting, Cuff Size: Normal)   Pulse 73   Temp (!) 97.5 F (36.4 C) (Temporal)   Resp 18   Ht 5\' 6"  (1.676 m)   Wt 212 lb (96.2 kg)   SpO2 97%   BMI 34.22 kg/m  Vitals with BMI 12/17/2019 12/16/2019 12/15/2019  Height 5\' 6"  5\' 6"  5\' 6"   Weight 212 lbs 212 lbs 10 oz -  BMI 35.57 32.20 -  Systolic 254 270 623  Diastolic 80 66 62  Pulse 73 64 84     Physical Exam  She looks systemically well.  She has maintained her weight loss so far.  Blood pressure slightly elevated today because she has a painful foot.     Assessment   1. Essential hypertension   2. Hot flashes due to menopause   3. Obesity (BMI 30-39.9)   4. Mixed hyperlipidemia       Tests ordered Orders Placed This Encounter  Procedures  . T3, free  . Progesterone  . Estradiol     Plan: 1. She will continue with antihypertensive therapy mentioned above and this is controlling her blood pressure reasonably well. 2. She will continue with progesterone and we will check levels today.  I also want to check estradiol levels as a baseline. 3. She will continue with desiccated thyroid and we will check T3 levels to see if we need to adjust the dose. 4. Further recommendations will depend on the results and I will see him in 2 months time for follow-up.   Meds ordered this encounter  Medications  . progesterone (PROMETRIUM) 200 MG capsule    Sig: Take 1 capsule (200 mg total) by mouth daily.    Dispense:  90 capsule    Refill:  1    Aziz Slape Luther Parody, MD

## 2019-12-18 ENCOUNTER — Other Ambulatory Visit (INDEPENDENT_AMBULATORY_CARE_PROVIDER_SITE_OTHER): Payer: Self-pay | Admitting: Internal Medicine

## 2019-12-18 MED ORDER — NP THYROID 60 MG PO TABS: 60.0000 mg | ORAL_TABLET | Freq: Every day | ORAL | 3 refills | Status: DC

## 2019-12-18 NOTE — Progress Notes (Signed)
CC'ED TO PCP 

## 2019-12-24 ENCOUNTER — Ambulatory Visit: Payer: BC Managed Care – PPO | Admitting: Orthopedic Surgery

## 2019-12-24 ENCOUNTER — Telehealth: Payer: Self-pay | Admitting: Orthopedic Surgery

## 2019-12-24 NOTE — Telephone Encounter (Addendum)
Called her told her told her to come in tomorrow 320

## 2019-12-24 NOTE — Telephone Encounter (Signed)
Patient called and states Dr. Aline Brochure said she can change the tape on Tuesday and she did last night and now they both have gone back side ways like he never fixed them. She is in ouchy pain    Please call her at [phone].

## 2019-12-25 ENCOUNTER — Ambulatory Visit: Payer: BC Managed Care – PPO

## 2019-12-25 ENCOUNTER — Ambulatory Visit (INDEPENDENT_AMBULATORY_CARE_PROVIDER_SITE_OTHER): Payer: BC Managed Care – PPO | Admitting: Internal Medicine

## 2019-12-25 ENCOUNTER — Encounter: Payer: Self-pay | Admitting: Orthopedic Surgery

## 2019-12-25 ENCOUNTER — Ambulatory Visit (INDEPENDENT_AMBULATORY_CARE_PROVIDER_SITE_OTHER): Payer: BC Managed Care – PPO | Admitting: Orthopedic Surgery

## 2019-12-25 ENCOUNTER — Other Ambulatory Visit: Payer: Self-pay

## 2019-12-25 VITALS — BP 145/87 | HR 74

## 2019-12-25 DIAGNOSIS — S92511A Displaced fracture of proximal phalanx of right lesser toe(s), initial encounter for closed fracture: Secondary | ICD-10-CM

## 2019-12-25 NOTE — Patient Instructions (Signed)
Keep it taped ; keep prior appt

## 2019-12-25 NOTE — Progress Notes (Addendum)
Chief Complaint  Patient presents with  . Foot Pain    Closed fracture of 4th metatatsal, 09/09/19.  Also has fracture right foot third digit she took the tape off tueday and the toe went back to previuos potition.     Michelle Patton comes in after a closed reduction under local anesthesia in the office of the proximal phalanx right foot on August 23.  I told her to take the tape off today for a shower and the toe came out of position she put it back and taped in regard to take an x-ray of it with it taped.  Place in a short cam walker   Encounter Diagnosis  Name Primary?  . Closed displaced fracture of proximal phalanx of lesser toe of right foot, initial encounter Yes   Fu xrays

## 2019-12-31 ENCOUNTER — Telehealth: Payer: Self-pay | Admitting: *Deleted

## 2019-12-31 DIAGNOSIS — S92511A Displaced fracture of proximal phalanx of right lesser toe(s), initial encounter for closed fracture: Secondary | ICD-10-CM | POA: Insufficient documentation

## 2019-12-31 NOTE — Telephone Encounter (Signed)
Called pt. She has been scheduled for TCS with propofol with Dr. Gala Romney, ASA 2 on 11/18 at 7:30am. Aware will mail prep instructions with covid test appt.

## 2020-01-01 ENCOUNTER — Encounter: Payer: Self-pay | Admitting: Orthopedic Surgery

## 2020-01-01 ENCOUNTER — Ambulatory Visit: Payer: BC Managed Care – PPO

## 2020-01-01 ENCOUNTER — Other Ambulatory Visit: Payer: Self-pay

## 2020-01-01 ENCOUNTER — Ambulatory Visit (INDEPENDENT_AMBULATORY_CARE_PROVIDER_SITE_OTHER): Payer: BC Managed Care – PPO | Admitting: Orthopedic Surgery

## 2020-01-01 VITALS — BP 147/83 | HR 71 | Ht 66.0 in

## 2020-01-01 DIAGNOSIS — S92511D Displaced fracture of proximal phalanx of right lesser toe(s), subsequent encounter for fracture with routine healing: Secondary | ICD-10-CM

## 2020-01-01 NOTE — Progress Notes (Signed)
Chief Complaint  Patient presents with  . Toe Pain    Closed displaced fracture of proximal phalnax, Dec 13, 2019    And fractured the third toe right foot she lost fixation when she took the tape off after a week (which I told her to do) however she did retaped it reduction was redone and x-ray showed a fracture lined up nicely  X-rays and clinical exam today show the fracture is again lined up nicely  Continue taping x-ray 2 weeks

## 2020-01-01 NOTE — Patient Instructions (Signed)
Tape/boot

## 2020-01-14 ENCOUNTER — Other Ambulatory Visit: Payer: Self-pay | Admitting: Family Medicine

## 2020-01-14 ENCOUNTER — Other Ambulatory Visit: Payer: Self-pay | Admitting: Cardiology

## 2020-01-14 DIAGNOSIS — E785 Hyperlipidemia, unspecified: Secondary | ICD-10-CM

## 2020-01-15 ENCOUNTER — Ambulatory Visit: Payer: BC Managed Care – PPO

## 2020-01-15 ENCOUNTER — Ambulatory Visit (INDEPENDENT_AMBULATORY_CARE_PROVIDER_SITE_OTHER): Payer: BC Managed Care – PPO | Admitting: Orthopedic Surgery

## 2020-01-15 ENCOUNTER — Encounter: Payer: Self-pay | Admitting: Orthopedic Surgery

## 2020-01-15 ENCOUNTER — Other Ambulatory Visit: Payer: Self-pay

## 2020-01-15 DIAGNOSIS — S92511D Displaced fracture of proximal phalanx of right lesser toe(s), subsequent encounter for fracture with routine healing: Secondary | ICD-10-CM

## 2020-01-15 DIAGNOSIS — G5601 Carpal tunnel syndrome, right upper limb: Secondary | ICD-10-CM

## 2020-01-15 NOTE — Patient Instructions (Addendum)
1. Keep taped for 2 weeks wear boot 2 more weeks  FOLLOW UP 2 WEEKS FOR THE TOE   2. Carpal tunnel release post op appt 2 weeks out

## 2020-01-15 NOTE — Progress Notes (Signed)
Chief Complaint  Patient presents with  . Foot Injury    12/15/19 toe fractures closed manipulation    Alyanah was seen today for foot injury.  Diagnoses and all orders for this visit:  Closed displaced fracture of proximal phalanx of lesser toe of right foot with routine healing, subsequent encounter -     DG Toe 4th Right; Future  Carpal tunnel syndrome of right wrist  Assessment and plan  Fourth toe fracture right foot proximal phalanx.  Continue taping for 2 weeks  Come back in 2 weeks  Right carpal tunnel syndrome planning for surgery right wrist  History 64 year old female with a fracture of her fourth toe right foot she had to remanipulate the toe and retape it after she took the tape off as the toe reangulated but now with the tape off the toe is straight her x-ray looks good.  She will continue the taping for 2 weeks  As far as the carpal tunnel goes pain became severe severe night pain.  Does not want to be on gabapentin.  Full H&P for the surgery to be determined at a later date

## 2020-01-20 ENCOUNTER — Other Ambulatory Visit: Payer: Self-pay | Admitting: Orthopedic Surgery

## 2020-02-02 ENCOUNTER — Ambulatory Visit (INDEPENDENT_AMBULATORY_CARE_PROVIDER_SITE_OTHER): Payer: BC Managed Care – PPO | Admitting: Orthopedic Surgery

## 2020-02-02 ENCOUNTER — Other Ambulatory Visit: Payer: Self-pay

## 2020-02-02 DIAGNOSIS — S92511D Displaced fracture of proximal phalanx of right lesser toe(s), subsequent encounter for fracture with routine healing: Secondary | ICD-10-CM

## 2020-02-02 NOTE — Progress Notes (Signed)
Chief Complaint  Patient presents with  . Follow-up    Recheck on right foot, 4th toe, DOI 12-15-19.    Fractured toe right foot  Overall alignment looks good compared to the opposite foot no tenderness or swelling  Patient may remove the postop shoe return to normal activity follow-up as needed regarding her foot  Patient will be scheduled for carpal tunnel release expects out of work 2 to 4 weeks

## 2020-02-16 ENCOUNTER — Encounter: Payer: Self-pay | Admitting: Orthopedic Surgery

## 2020-02-16 ENCOUNTER — Other Ambulatory Visit: Payer: Self-pay | Admitting: Orthopedic Surgery

## 2020-02-16 DIAGNOSIS — M1712 Unilateral primary osteoarthritis, left knee: Secondary | ICD-10-CM

## 2020-02-17 ENCOUNTER — Other Ambulatory Visit: Payer: Self-pay

## 2020-02-17 ENCOUNTER — Encounter (INDEPENDENT_AMBULATORY_CARE_PROVIDER_SITE_OTHER): Payer: Self-pay | Admitting: Internal Medicine

## 2020-02-17 ENCOUNTER — Ambulatory Visit (INDEPENDENT_AMBULATORY_CARE_PROVIDER_SITE_OTHER): Payer: BC Managed Care – PPO | Admitting: Internal Medicine

## 2020-02-17 VITALS — BP 124/82 | HR 70 | Temp 95.9°F | Ht 66.0 in | Wt 205.6 lb

## 2020-02-17 DIAGNOSIS — I10 Essential (primary) hypertension: Secondary | ICD-10-CM | POA: Diagnosis not present

## 2020-02-17 DIAGNOSIS — Z23 Encounter for immunization: Secondary | ICD-10-CM | POA: Diagnosis not present

## 2020-02-17 DIAGNOSIS — E785 Hyperlipidemia, unspecified: Secondary | ICD-10-CM | POA: Diagnosis not present

## 2020-02-17 DIAGNOSIS — E669 Obesity, unspecified: Secondary | ICD-10-CM | POA: Diagnosis not present

## 2020-02-17 DIAGNOSIS — N951 Menopausal and female climacteric states: Secondary | ICD-10-CM | POA: Diagnosis not present

## 2020-02-17 LAB — T3, FREE: T3, Free: 2.4 pg/mL (ref 2.3–4.2)

## 2020-02-17 LAB — TSH: TSH: 1.61 mIU/L (ref 0.40–4.50)

## 2020-02-17 MED ORDER — ROSUVASTATIN CALCIUM 20 MG PO TABS: 20.0000 mg | ORAL_TABLET | Freq: Every day | ORAL | 1 refills | Status: DC

## 2020-02-17 MED ORDER — ESTRADIOL 0.5 MG PO TABS: 0.5000 mg | ORAL_TABLET | Freq: Every day | ORAL | 3 refills | Status: DC

## 2020-02-17 MED ORDER — PROGESTERONE 200 MG PO CAPS: 400.0000 mg | ORAL_CAPSULE | Freq: Every day | ORAL | 0 refills | Status: DC

## 2020-02-17 NOTE — Addendum Note (Signed)
Addended by: Anibal Henderson on: 02/17/2020 10:23 AM   Modules accepted: Orders

## 2020-02-17 NOTE — Progress Notes (Signed)
Metrics: Intervention Frequency ACO  Documented Smoking Status Yearly  Screened one or more times in 24 months  Cessation Counseling or  Active cessation medication Past 24 months  Past 24 months   Guideline developer: UpToDate (See UpToDate for funding source) Date Released: 2014       Wellness Office Visit  Subjective:  Patient ID: Michelle Patton, female    DOB: 1955-12-15  Age: 64 y.o. MRN: [id number]  CC: This lady comes in for follow-up of hypertension, obesity, hyperlipidemia and menopausal symptoms. HPI  She has tolerated the higher dose of NP thyroid for symptoms of thyroid deficiency.  She is trying to do intermittent fasting on a daily basis and eating healthier and as a result has lost weight. She is still troubled by hot flashes significantly.  Progesterone 200 mg at night has helped to some degree but the hot flashes still seem to be a problem. Past Medical History:  Diagnosis Date  . Anxiety   . Aortic regurgitation 07/20/2014  . Aortic stenosis, severe 07/20/2014  . Arthritis   . DVT (deep venous thrombosis) ([place])   . Family history of adverse reaction to anesthesia    Reports father deliurm in his 37's with CABG  . GERD (gastroesophageal reflux disease)   . History of pneumonia   . Hyperlipidemia   . Hypertension   . Insomnia, unspecified   . Muscle weakness (generalized)   . Peripheral artery disease ([place])   . 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 Sleep apnea    diagnosed multiple years ago at [facility]  . Supravalvular aortic stenosis, congenital - s/p repair during childhood    Past Surgical History:  Procedure Laterality Date  . ABDOMINAL AORTAGRAM  06/24/12  . ABDOMINAL AORTAGRAM N/A 06/24/2012   Procedure: ABDOMINAL Maxcine Ham;  Surgeon: Angelia Mould, MD;  Location: [facility] CATH  LAB;  Service: Cardiovascular;  Laterality: N/A;  . AORTIC VALVE REPLACEMENT N/A 10/07/2014   Procedure: REDO AORTIC VALVE REPLACEMENT (AVR);  Surgeon: Rexene Alberts, MD;  Location: [place];  Service: Open Heart Surgery;  Laterality: N/A;  . ASCENDING AORTIC ROOT REPLACEMENT N/A 10/07/2014   Procedure: ASCENDING AORTIC ROOT REPLACEMENT;  Surgeon: Rexene Alberts, MD;  Location: [place];  Service: Open Heart Surgery;  Laterality: N/A;  . BIOPSY  09/27/2016   Procedure: BIOPSY;  Surgeon: Daneil Dolin, MD;  [facility];  Service: Endoscopy;;  colon  . BREAST REDUCTION SURGERY Bilateral 01/21/2018   Procedure: BREAST REDUCTION WITH LIPOSUCTION;  Surgeon: Cristine Polio, MD;  Location: [place];  Service: Plastics;  Laterality: Bilateral;  . [place]  . CATARACT EXTRACTION W/PHACO Left 05/30/2019   Procedure: CATARACT EXTRACTION PHACO AND INTRAOCULAR LENS PLACEMENT (IOC) (CDE: 4.94  );  Surgeon: Baruch Goldmann, MD;  [facility]: [facility];  Service: Ophthalmology;  Laterality: Left;  . CATARACT EXTRACTION W/PHACO Right 06/16/2019   Procedure: CATARACT EXTRACTION PHACO AND INTRAOCULAR LENS PLACEMENT (IOC);  Surgeon: Baruch Goldmann, MD;  [facility]: [facility];  Service: Ophthalmology;  Laterality: Right;  CDE: 4.64  . CERVICAL FUSION    . CHOLECYSTECTOMY    . COLONOSCOPY WITH PROPOFOL N/A 09/27/2016   Dr. Gala Romney: Diverticulosis, several tubular adenomas removed ranging 4 to 7 mm in size, internal grade 1 hemorrhoids, terminal ileum normal, segmental biopsies negative for  microscopic colitis.  Next colonoscopy June 2021  . ESOPHAGOGASTRODUODENOSCOPY (EGD) WITH PROPOFOL N/A 09/27/2016   Dr. Gala Romney: Small hiatal hernia, mild Schatzki ring status post disruption, LA grade a esophagitis  . ILIAC ARTERY STENT Left 12/2007  . KNEE ARTHROSCOPY WITH MEDIAL MENISECTOMY Right 01/10/2018   Procedure: RIGHT KNEE ARTHROSCOPY WITH PARTIAL MEDIAL MENISECTOMY;  Surgeon: Carole Civil,  MD;  [facility]: [facility];  Service: Orthopedics;  Laterality: Right;  . LEFT AND RIGHT HEART CATHETERIZATION WITH CORONARY ANGIOGRAM N/A 07/31/2014   Procedure: LEFT AND RIGHT HEART CATHETERIZATION WITH CORONARY ANGIOGRAM;  Surgeon: Burnell Blanks, MD;  Location: [facility] CATH LAB;  Service: Cardiovascular;  Laterality: N/A;  . Venia Minks DILATION N/A 09/27/2016   Procedure: Venia Minks DILATION;  Surgeon: Daneil Dolin, MD;  [facility];  Service: Endoscopy;  Laterality: N/A;  . POLYPECTOMY  09/27/2016   Procedure: POLYPECTOMY;  Surgeon: Daneil Dolin, MD;  [facility];  Service: Endoscopy;;  colon  . ROTATOR CUFF REPAIR Bilateral   . TEE WITHOUT CARDIOVERSION N/A 07/07/2014   Procedure: TRANSESOPHAGEAL ECHOCARDIOGRAM (TEE);  Surgeon: Arnoldo Lenis, MD;  [facility];  Service: Cardiology;  Laterality: N/A;  . TEE WITHOUT CARDIOVERSION N/A 07/20/2014   Procedure: TRANSESOPHAGEAL ECHOCARDIOGRAM (TEE) WITH PROPOFOL;  Surgeon: Arnoldo Lenis, MD;  [facility];  Service: Endoscopy;  Laterality: N/A;  . TEE WITHOUT CARDIOVERSION N/A 10/07/2014   Procedure: TRANSESOPHAGEAL ECHOCARDIOGRAM (TEE);  Surgeon: Rexene Alberts, MD;  Location: [place];  Service: Open Heart Surgery;  Laterality: N/A;     Family History  Problem Relation Age of Onset  . Hypertension Mother   . Hypertension Father   . Heart disease Father        before age 85  . Other Father        varicose veins  . COPD Father   . Colon cancer Neg Hx   . Celiac disease Neg Hx   . Inflammatory bowel disease Neg Hx     Social History   Social History Narrative   Patient is married   for 5 years . Patient works at [employer].. 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   Social History   Tobacco Use  . Smoking status: Current Every Day Smoker    Packs/day: 0.50    Years: 40.00    Pack years: 20.00    Types: Cigarettes  . Smokeless tobacco: Never  Used  Substance Use Topics  . Alcohol use: Yes    Alcohol/week: 2.0 standard drinks    Types: 2 Cans of beer per week    Current Meds  Medication Sig  . aspirin EC 81 MG tablet Take 1 tablet (81 mg total) by mouth daily. Swallow whole.  . Cholecalciferol (VITAMIN D3) 5000 UNITS TABS Take 10,000 Units by mouth every morning.   . diclofenac (VOLTAREN) 75 MG EC tablet TAKE 1 TABLET TWICE A DAY (DO NOT CRUSH)  . Flaxseed, Linseed, (FLAXSEED OIL) 1000 MG CAPS Take 1,000 mg by mouth daily.   [name] ibuprofen (ADVIL) 600 MG tablet Take 1 tablet (600 mg total) by mouth every 8 (eight) hours as needed for moderate pain.  [name] losartan (COZAAR) 25 MG tablet TAKE 1 TABLET DAILY  . Melatonin 3 MG SUBL Place 4 mg under the tongue at bedtime.  . metoprolol tartrate (LOPRESSOR) 25 MG tablet TAKE 1 TABLET TWICE A DAY  . Multiple Minerals (CALCIUM-MAGNESIUM-ZINC) TABS Take 1 tablet by mouth daily.  [name]  Multiple Vitamin (MULTIVITAMIN WITH MINERALS) TABS tablet Take 1 tablet by mouth daily.  . NP THYROID 60 MG tablet Take 1 tablet (60 mg total) by mouth daily before breakfast.  . progesterone (PROMETRIUM) 200 MG capsule Take 1 capsule (200 mg total) by mouth daily.  . RABEprazole (ACIPHEX) 20 MG tablet Take 1 tablet (20 mg total) by mouth 2 (two) times daily before a meal.  . rosuvastatin (CRESTOR) 20 MG tablet Take 1 tablet (20 mg total) by mouth daily.  . vitamin B-12 (CYANOCOBALAMIN) 1000 MCG tablet Take 1,000 mcg by mouth daily.  [name] zolpidem (AMBIEN) 5 MG tablet Take 1 tablet (5 mg total) by mouth at bedtime as needed for sleep.  . [DISCONTINUED] HYDROcodone-acetaminophen (NORCO/VICODIN) 5-325 MG tablet Take 1-2 tablets by mouth every 6 (six) hours as needed.  . [DISCONTINUED] rosuvastatin (CRESTOR) 20 MG tablet Take 1 tablet (20 mg total) by mouth daily.      Depression screen [facility] 2/9 07/16/2019 04/07/2019 12/09/2018 11/04/2014  Decreased Interest 0 0 0 0  Down, Depressed, Hopeless 0 0 0 1  PHQ - 2 Score 0 0 0  1  Some recent data might be hidden     Objective:   Today's Vitals: BP 124/82   Pulse 70   Temp (!) 95.9 F (35.5 C) (Temporal)   Ht 5\' 6"  (1.676 m)   Wt 205 lb 9.6 oz (93.3 kg)   SpO2 99%   BMI 33.18 kg/m  Vitals with BMI 02/17/2020 01/01/2020 12/25/2019  Height 5\' 6"  5\' 6"  -  Weight 205 lbs 10 oz - -  BMI 34.1 - -  Systolic 962 229 798  Diastolic 82 83 87  Pulse 70 71 74     Physical Exam  She looks systemically well.  She remains obese but she has lost 7 pounds since last time I saw her.  Blood pressure is excellent.     Assessment   1. Essential hypertension   2. Hot flashes due to menopause   3. Obesity (BMI 30-39.9)   4. Hyperlipidemia, unspecified hyperlipidemia type       Tests ordered Orders Placed This Encounter  Procedures  . T3, free  . TSH     Plan: 1. She will continue with current dose of losartan and metoprolol for hypertension. 2. She will continue with Crestor for her hyperlipidemia and I have sent a new refill prescription. 3. She will continue with NP thyroid 60 mg daily and we will check thyroid function test today. 4. As far as her menopausal symptoms are concerned, I am going to start her on estradiol 0.5 mg daily in the morning and also increase progesterone to 400 mg at night.  I have sent a new prescription to reflect all these changes.  I have told her about possible side effects with estradiol and we also briefly discussed the difference between bioidentical and synthetic hormones and the women's health initiative study. 5. Follow-up in about 6 weeks.  Influenza vaccination was given today.   Meds ordered this encounter  Medications  . estradiol (ESTRACE) 0.5 MG tablet    Sig: Take 1 tablet (0.5 mg total) by mouth daily.    Dispense:  30 tablet    Refill:  3  . progesterone (PROMETRIUM) 200 MG capsule    Sig: Take 2 capsules (400 mg total) by mouth daily.    Dispense:  180 capsule    Refill:  0  . rosuvastatin (CRESTOR) 20  MG tablet    Sig:  Take 1 tablet (20 mg total) by mouth daily.    Dispense:  90 tablet    Refill:  1    Gearldene Fiorenza Luther Parody, MD

## 2020-02-18 ENCOUNTER — Other Ambulatory Visit (INDEPENDENT_AMBULATORY_CARE_PROVIDER_SITE_OTHER): Payer: Self-pay | Admitting: Internal Medicine

## 2020-02-18 MED ORDER — NP THYROID 90 MG PO TABS: 90.0000 mg | ORAL_TABLET | Freq: Every day | ORAL | 3 refills | Status: DC

## 2020-02-23 NOTE — Progress Notes (Signed)
Called pt to see if she open mychart to see the response on medication change per labs. LVM on the moblie contact. Asked her to reply back to you in mychart she seen and understands instructions.

## 2020-02-27 NOTE — Patient Instructions (Signed)
Michelle Patton  02/27/2020     @PREFPERIOPPHARMACY @   Your procedure is scheduled on  03/02/2020.  Report to [facility] at  [phone]  A.M.  Call this number if you have problems the morning of surgery:  [phone]   Remember:  Do not eat or drink after midnight.                        Take these medicines the morning of surgery with A SIP OF WATER  Voltaren, metoprolol, NP thyroid.    Do not wear jewelry, make-up or nail polish.  Do not wear lotions, powders, or perfumes. Please wear deodorant and brush your teeth.  Do not shave 48 hours prior to surgery.  Men may shave face and neck.  Do not bring valuables to the hospital.  [facility] is not responsible for any belongings or valuables.  Contacts, dentures or bridgework may not be worn into surgery.  Leave your suitcase in the car.  After surgery it may be brought to your room.  For patients admitted to the hospital, discharge time will be determined by your treatment team.  Patients discharged the day of surgery will not be allowed to drive home.   Name and phone number of your driver:   family Special instructions:   DO NOT smoke the morning of your procedure.  Please read over the following fact sheets that you were given. Anesthesia Post-op Instructions and Care and Recovery After Surgery       Open Carpal Tunnel Release, Care After This sheet gives you information about how to care for yourself after your procedure. Your health care provider may also give you more specific instructions. If you have problems or questions, contact your health care provider. What can I expect after the procedure? After the procedure, it is common to have:  Wrist stiffness.  Bruising. Follow these instructions at home: Bathing  Do not take baths, swim, or use a hot tub until your health care provider approves. Ask your health care provider if you may take showers.  Keep your bandage (dressing) dry until your health care  provider says it can be removed. If you have a splint or brace:  Wear the splint or brace as told by your health care provider. You may need to wear it for 2-3 weeks. Remove it only as told by your health care provider.  Loosen the splint or brace if your fingers tingle, become numb, or turn cold and blue.  Keep the splint or brace clean.  If the splint or brace is not waterproof: ? Do not let it get wet. ? Cover it with a watertight covering when you take a bath or a shower. Incision care   Follow instructions from your health care provider about how to take care of your incision. Make sure you: ? Wash your hands with soap and water before you change your dressing. If soap and water are not available, use hand sanitizer. ? Change your dressing as told by your health care provider. ? Leave stitches (sutures), skin glue, or adhesive strips in place. These skin closures may need to stay in place for 2 weeks or longer. If adhesive strip edges start to loosen and curl up, you may trim the loose edges. Do not remove adhesive strips completely unless your health care provider tells you to do that.  Check your incision area every day for signs  of infection. Check for: ? Redness, swelling, or pain. ? Fluid or blood. ? Warmth. ? Pus or a bad smell. Managing pain, stiffness, and swelling   If directed, put ice on the affected area. ? If you have a removable splint or brace, remove it as told by your health care provider. ? Put ice in a plastic bag. ? Place a towel between your skin and the bag. ? Leave the ice on for 20 minutes, 2-3 times a day.  Move your fingers often to avoid stiffness and to lessen swelling.  Raise (elevate) your wrist above the level of your heart while you are sitting or lying down. Activity  Do not drive until your health care provider approves.  Do not drive or use heavy machinery while taking prescription pain medicine.  Return to your normal activities as  told by your health care provider. Avoid activities that cause pain.  If physical therapy was prescribed, do exercises as told by your therapist. Physical therapy can help you heal faster and regain movement. General instructions  Take over-the-counter and prescription medicines only as told by your health care provider.  If you are taking prescription pain medicine, take actions to prevent or treat constipation. Your health care provider may recommend that you: ? Drink enough fluid to keep your urine pale yellow. ? Eat foods that are high in fiber, such as fresh fruits and vegetables, whole grains, and beans. ? Limit foods that are high in fat and processed sugars, such as fried or sweet foods. ? Take an over-the-counter or prescription medicine for constipation.  Do not use any products that contain nicotine or tobacco, such as cigarettes and e-cigarettes. If you need help quitting, ask your health care provider.  Keep all follow-up visits as told by your health care provider and physical therapist. This is important. Contact a health care provider if:  You have redness or swelling around your incision.  You have fluid or blood coming from your incision.  Your incision feels warm to the touch.  You have pus or a bad smell coming from your incision.  You have a fever.  You have chills.  You have pain that does not get better with medicine.  Your carpal tunnel symptoms do not go away after 2 months.  Your carpal tunnel symptoms go away and then come back. Get help right away if:  You have pain or numbness that is getting worse.  Your fingers or fingertips become very pale or bluish in color.  You are not able to move your fingers. Summary  It is common to have wrist stiffness and bruising after a carpal tunnel release.  Icing and raising (elevating) your wrist may help to lessen swelling and pain.  Call your health care provider if you have a fever or notice any signs  of infection in your incision area. This information is not intended to replace advice given to you by your health care provider. Make sure you discuss any questions you have with your health care provider. Document Revised: 03/23/2017 Document Reviewed: 12/18/2016 Elsevier Patient Education  2020 [place] After These instructions provide you with information about caring for yourself after your procedure. Your health care provider may also give you more specific instructions. Your treatment has been planned according to current medical practices, but problems sometimes occur. Call your health care provider if you have any problems or questions after your procedure. What can I expect after the procedure?  After your procedure, you may:  Feel sleepy for several hours.  Feel clumsy and have poor balance for several hours.  Feel forgetful about what happened after the procedure.  Have poor judgment for several hours.  Feel nauseous or vomit.  Have a sore throat if you had a breathing tube during the procedure. Follow these instructions at home: For at least 24 hours after the procedure:      Have a responsible adult stay with you. It is important to have someone help care for you until you are awake and alert.  Rest as needed.  Do not: ? Participate in activities in which you could fall or become injured. ? Drive. ? Use heavy machinery. ? Drink alcohol. ? Take sleeping pills or medicines that cause drowsiness. ? Make important decisions or sign legal documents. ? Take care of children on your own. Eating and drinking  Follow the diet that is recommended by your health care provider.  If you vomit, drink water, juice, or soup when you can drink without vomiting.  Make sure you have little or no nausea before eating solid foods. General instructions  Take over-the-counter and prescription medicines only as told by your health care  provider.  If you have sleep apnea, surgery and certain medicines can increase your risk for breathing problems. Follow instructions from your health care provider about wearing your sleep device: ? Anytime you are sleeping, including during daytime naps. ? While taking prescription pain medicines, sleeping medicines, or medicines that make you drowsy.  If you smoke, do not smoke without supervision.  Keep all follow-up visits as told by your health care provider. This is important. Contact a health care provider if:  You keep feeling nauseous or you keep vomiting.  You feel light-headed.  You develop a rash.  You have a fever. Get help right away if:  You have trouble breathing. Summary  For several hours after your procedure, you may feel sleepy and have poor judgment.  Have a responsible adult stay with you for at least 24 hours or until you are awake and alert. This information is not intended to replace advice given to you by your health care provider. Make sure you discuss any questions you have with your health care provider. Document Revised: 07/09/2017 Document Reviewed: 08/01/2015 Elsevier Patient Education  [place]. How to Use Chlorhexidine for Bathing Chlorhexidine gluconate (CHG) is a germ-killing (antiseptic) solution that is used to clean the skin. It can get rid of the bacteria that normally live on the skin and can keep them away for about 24 hours. To clean your skin with CHG, you may be given:  A CHG solution to use in the shower or as part of a sponge bath.  A prepackaged cloth that contains CHG. Cleaning your skin with CHG may help lower the risk for infection:  While you are staying in the intensive care unit of the hospital.  If you have a vascular access, such as a central line, to provide short-term or long-term access to your veins.  If you have a catheter to drain urine from your bladder.  If you are on a ventilator. A ventilator is a  machine that helps you breathe by moving air in and out of your lungs.  After surgery. What are the risks? Risks of using CHG include:  A skin reaction.  Hearing loss, if CHG gets in your ears.  Eye injury, if CHG gets in your eyes and is not  rinsed out.  The CHG product catching fire. Make sure that you avoid smoking and flames after applying CHG to your skin. Do not use CHG:  If you have a chlorhexidine allergy or have previously reacted to chlorhexidine.  On babies younger than 42 months of age. How to use CHG solution  Use CHG only as told by your health care provider, and follow the instructions on the label.  Use the full amount of CHG as directed. Usually, this is one bottle. During a shower Follow these steps when using CHG solution during a shower (unless your health care provider gives you different instructions): 1. Start the shower. 2. Use your normal soap and shampoo to wash your face and hair. 3. Turn off the shower or move out of the shower stream. 4. Pour the CHG onto a clean washcloth. Do not use any type of brush or rough-edged sponge. 5. Starting at your neck, lather your body down to your toes. Make sure you follow these instructions: ? If you will be having surgery, pay special attention to the part of your body where you will be having surgery. Scrub this area for at least 1 minute. ? Do not use CHG on your head or face. If the solution gets into your ears or eyes, rinse them well with water. ? Avoid your genital area. ? Avoid any areas of skin that have broken skin, cuts, or scrapes. ? Scrub your back and under your arms. Make sure to wash skin folds. 6. Let the lather sit on your skin for 1-2 minutes or as long as told by your health care provider. 7. Thoroughly rinse your entire body in the shower. Make sure that all body creases and crevices are rinsed well. 8. Dry off with a clean towel. Do not put any substances on your body afterward--such as powder,  lotion, or perfume--unless you are told to do so by your health care provider. Only use lotions that are recommended by the manufacturer. 9. Put on clean clothes or pajamas. 10. If it is the night before your surgery, sleep in clean sheets.  During a sponge bath Follow these steps when using CHG solution during a sponge bath (unless your health care provider gives you different instructions): 1. Use your normal soap and shampoo to wash your face and hair. 2. Pour the CHG onto a clean washcloth. 3. Starting at your neck, lather your body down to your toes. Make sure you follow these instructions: ? If you will be having surgery, pay special attention to the part of your body where you will be having surgery. Scrub this area for at least 1 minute. ? Do not use CHG on your head or face. If the solution gets into your ears or eyes, rinse them well with water. ? Avoid your genital area. ? Avoid any areas of skin that have broken skin, cuts, or scrapes. ? Scrub your back and under your arms. Make sure to wash skin folds. 4. Let the lather sit on your skin for 1-2 minutes or as long as told by your health care provider. 5. Using a different clean, wet washcloth, thoroughly rinse your entire body. Make sure that all body creases and crevices are rinsed well. 6. Dry off with a clean towel. Do not put any substances on your body afterward--such as powder, lotion, or perfume--unless you are told to do so by your health care provider. Only use lotions that are recommended by the manufacturer. 7. Put on clean  clothes or pajamas. 8. If it is the night before your surgery, sleep in clean sheets. How to use CHG prepackaged cloths  Only use CHG cloths as told by your health care provider, and follow the instructions on the label.  Use the CHG cloth on clean, dry skin.  Do not use the CHG cloth on your head or face unless your health care provider tells you to.  When washing with the CHG cloth: ? Avoid  your genital area. ? Avoid any areas of skin that have broken skin, cuts, or scrapes. Before surgery Follow these steps when using a CHG cloth to clean before surgery (unless your health care provider gives you different instructions): 1. Using the CHG cloth, vigorously scrub the part of your body where you will be having surgery. Scrub using a back-and-forth motion for 3 minutes. The area on your body should be completely wet with CHG when you are done scrubbing. 2. Do not rinse. Discard the cloth and let the area air-dry. Do not put any substances on the area afterward, such as powder, lotion, or perfume. 3. Put on clean clothes or pajamas. 4. If it is the night before your surgery, sleep in clean sheets.  For general bathing Follow these steps when using CHG cloths for general bathing (unless your health care provider gives you different instructions). 1. Use a separate CHG cloth for each area of your body. Make sure you wash between any folds of skin and between your fingers and toes. Wash your body in the following order, switching to a new cloth after each step: ? The front of your neck, shoulders, and chest. ? Both of your arms, under your arms, and your hands. ? Your stomach and groin area, avoiding the genitals. ? Your right leg and foot. ? Your left leg and foot. ? The back of your neck, your back, and your buttocks. 2. Do not rinse. Discard the cloth and let the area air-dry. Do not put any substances on your body afterward--such as powder, lotion, or perfume--unless you are told to do so by your health care provider. Only use lotions that are recommended by the manufacturer. 3. Put on clean clothes or pajamas. Contact a health care provider if:  Your skin gets irritated after scrubbing.  You have questions about using your solution or cloth. Get help right away if:  Your eyes become very red or swollen.  Your eyes itch badly.  Your skin itches badly and is red or  swollen.  Your hearing changes.  You have trouble seeing.  You have swelling or tingling in your mouth or throat.  You have trouble breathing.  You swallow any chlorhexidine. Summary  Chlorhexidine gluconate (CHG) is a germ-killing (antiseptic) solution that is used to clean the skin. Cleaning your skin with CHG may help to lower your risk for infection.  You may be given CHG to use for bathing. It may be in a bottle or in a prepackaged cloth to use on your skin. Carefully follow your health care provider's instructions and the instructions on the product label.  Do not use CHG if you have a chlorhexidine allergy.  Contact your health care provider if your skin gets irritated after scrubbing. This information is not intended to replace advice given to you by your health care provider. Make sure you discuss any questions you have with your health care provider. Document Revised: 06/27/2018 Document Reviewed: 03/08/2017 Elsevier Patient Education  [place].

## 2020-03-01 ENCOUNTER — Other Ambulatory Visit (HOSPITAL_COMMUNITY)
Admission: RE | Admit: 2020-03-01 | Discharge: 2020-03-01 | Disposition: A | Discharge status: Home / Self Care | Payer: BC Managed Care – PPO | Source: Ambulatory Visit | Attending: Orthopedic Surgery | Admitting: Orthopedic Surgery

## 2020-03-01 ENCOUNTER — Encounter (HOSPITAL_COMMUNITY)
Admission: RE | Admit: 2020-03-01 | Discharge: 2020-03-01 | Disposition: A | Discharge status: Home / Self Care | Payer: BC Managed Care – PPO | Source: Ambulatory Visit | Attending: Orthopedic Surgery | Admitting: Orthopedic Surgery

## 2020-03-01 ENCOUNTER — Other Ambulatory Visit: Payer: Self-pay

## 2020-03-01 ENCOUNTER — Encounter (HOSPITAL_COMMUNITY): Payer: Self-pay

## 2020-03-01 DIAGNOSIS — Z20822 Contact with and (suspected) exposure to covid-19: Secondary | ICD-10-CM | POA: Diagnosis not present

## 2020-03-01 DIAGNOSIS — M659 Synovitis and tenosynovitis, unspecified: Secondary | ICD-10-CM | POA: Diagnosis not present

## 2020-03-01 DIAGNOSIS — Z01818 Encounter for other preprocedural examination: Secondary | ICD-10-CM | POA: Insufficient documentation

## 2020-03-01 DIAGNOSIS — G5601 Carpal tunnel syndrome, right upper limb: Secondary | ICD-10-CM | POA: Diagnosis present

## 2020-03-01 DIAGNOSIS — Z882 Allergy status to sulfonamides status: Secondary | ICD-10-CM | POA: Diagnosis not present

## 2020-03-01 DIAGNOSIS — Z880 Allergy status to penicillin: Secondary | ICD-10-CM | POA: Diagnosis not present

## 2020-03-01 DIAGNOSIS — Z881 Allergy status to other antibiotic agents status: Secondary | ICD-10-CM | POA: Diagnosis not present

## 2020-03-01 DIAGNOSIS — F17210 Nicotine dependence, cigarettes, uncomplicated: Secondary | ICD-10-CM | POA: Diagnosis not present

## 2020-03-01 LAB — CBC WITH DIFFERENTIAL/PLATELET
Abs Immature Granulocytes: 0.02 10*3/uL (ref 0.00–0.07)
Basophils Absolute: 0.1 10*3/uL (ref 0.0–0.1)
Basophils Relative: 1 %
Eosinophils Absolute: 0.3 10*3/uL (ref 0.0–0.5)
Eosinophils Relative: 4 %
HCT: 40.7 % (ref 36.0–46.0)
Hemoglobin: 13.3 g/dL (ref 12.0–15.0)
Immature Granulocytes: 0 %
Lymphocytes Relative: 32 %
Lymphs Abs: 2.3 10*3/uL (ref 0.7–4.0)
MCH: 29.6 pg (ref 26.0–34.0)
MCHC: 32.7 g/dL (ref 30.0–36.0)
MCV: 90.4 fL (ref 80.0–100.0)
Monocytes Absolute: 0.7 10*3/uL (ref 0.1–1.0)
Monocytes Relative: 10 %
Neutro Abs: 3.9 10*3/uL (ref 1.7–7.7)
Neutrophils Relative %: 53 %
Platelets: 255 10*3/uL (ref 150–400)
RBC: 4.5 MIL/uL (ref 3.87–5.11)
RDW: 13.3 % (ref 11.5–15.5)
WBC: 7.2 10*3/uL (ref 4.0–10.5)
nRBC: 0 % (ref 0.0–0.2)

## 2020-03-01 LAB — BASIC METABOLIC PANEL
Anion gap: 10 (ref 5–15)
BUN: 17 mg/dL (ref 8–23)
CO2: 25 mmol/L (ref 22–32)
Calcium: 9.5 mg/dL (ref 8.9–10.3)
Chloride: 102 mmol/L (ref 98–111)
Creatinine, Ser: 0.94 mg/dL (ref 0.44–1.00)
GFR, Estimated: >60 mL/min (ref >60)
Glucose, Bld: 91 mg/dL (ref 70–99)
Potassium: 4.3 mmol/L (ref 3.5–5.1)
Sodium: 137 mmol/L (ref 135–145)

## 2020-03-01 LAB — SARS CORONAVIRUS 2 (TAT 6-24 HRS): SARS Coronavirus 2: NEGATIVE

## 2020-03-01 NOTE — H&P (Signed)
Michelle Patton is an 64 y.o. female.   Chief Complaint: Pain and paresthesias right upper extremity HPI: 64 year old female with history of pain and paresthesias in the right upper extremity consistent with median nerve compression in the carpal tunnel.  Patient did not improve with standard nonoperative measures including bracing activity modification and oral therapy.  She decided to proceed with the carpal tunnel release due to persistent symptoms and dysfunction  Past Medical History:  Diagnosis Date  . Anxiety   . Aortic regurgitation 07/20/2014  . Aortic stenosis, severe 07/20/2014  . Arthritis   . DVT (deep venous thrombosis) ([place])   . Family history of adverse reaction to anesthesia    Reports father deliurm in his 55's with CABG  . GERD (gastroesophageal reflux disease)   . History of pneumonia   . Hyperlipidemia   . Hypertension   . Insomnia, unspecified   . Muscle weakness (generalized)   . Peripheral artery disease ([place])   . 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 Sleep apnea    diagnosed multiple years ago at [facility]  . Supravalvular aortic stenosis, congenital - s/p repair during childhood     Past Surgical History:  Procedure Laterality Date  . ABDOMINAL AORTAGRAM  06/24/12  . ABDOMINAL AORTAGRAM N/A 06/24/2012   Procedure: ABDOMINAL Maxcine Ham;  Surgeon: Angelia Mould, MD;  Location[facility] C[facility] LAB;  Service: Cardiovascular;  Laterality: N/A;  . AORTIC VALVE REPLACEMENT N/A 10/07/2014   Procedure: REDO AORTIC VALVE REPLACEMENT (AVR);  Surgeon: Rexene Alberts, MD;  Location: [place];  Service: Open Heart Surgery;  Laterality: N/A;  . ASCENDING AORTIC ROOT REPLACEMENT N/A 10/07/2014   Procedure: ASCENDING AORTIC ROOT REPLACEMENT;  Surgeon: Rexene Alberts, MD;  Location: [place];  Service: Open Heart  Surgery;  Laterality: N/A;  . BIOPSY  09/27/2016   Procedure: BIOPSY;  Surgeon: Daneil Dolin, MD;  [facility];  Service: Endoscopy;;  colon  . BREAST REDUCTION SURGERY Bilateral 01/21/2018   Procedure: BREAST REDUCTION WITH LIPOSUCTION;  Surgeon: Cristine Polio, MD;  Location: [place];  Service: Plastics;  Laterality: Bilateral;  . [place]  . CATARACT EXTRACTION W/PHACO Left 05/30/2019   Procedure: CATARACT EXTRACTION PHACO AND INTRAOCULAR LENS PLACEMENT (IOC) (CDE: 4.94  );  Surgeon: Baruch Goldmann, MD;  Location: [facility];  Service: Ophthalmology;  Laterality: Left;  . CATARACT EXTRACTION W/PHACO Right 06/16/2019   Procedure: CATARACT EXTRACTION PHACO AND INTRAOCULAR LENS PLACEMENT (IOC);  Surgeon: Baruch Goldmann, MD;  Location: [facility];  Service: Ophthalmology;  Laterality: Right;  CDE: 4.64  . CERVICAL FUSION    . CHOLECYSTECTOMY    . COLONOSCOPY WITH PROPOFOL N/A 09/27/2016   Dr. Gala Romney: Diverticulosis, several tubular adenomas removed ranging 4 to 7 mm in size, internal grade 1 hemorrhoids, terminal ileum normal, segmental biopsies negative for microscopic colitis.  Next colonoscopy June 2021  . ESOPHAGOGASTRODUODENOSCOPY (EGD) WITH PROPOFOL N/A 09/27/2016   Dr. Gala Romney: Small hiatal hernia, mild Schatzki ring status post disruption, LA grade a esophagitis  . ILIAC ARTERY STENT Left 12/2007  . KNEE ARTHROSCOPY WITH MEDIAL MENISECTOMY Right 01/10/2018   Procedure: RIGHT KNEE ARTHROSCOPY WITH PARTIAL MEDIAL MENISECTOMY;  Surgeon: Carole Civil, MD;  Location[facility];  Service: Orthopedics;  Laterality: Right;  . LEFT AND RIGHT HEART CATHETERIZATION WITH  CORONARY ANGIOGRAM N/A 07/31/2014   Procedure: LEFT AND RIGHT HEART CATHETERIZATION WITH CORONARY ANGIOGRAM;  Surgeon: Burnell Blanks, MD;  Location: [facility] CATH LAB;  Service: Cardiovascular;  Laterality: N/A;  . Venia Minks DILATION N/A 09/27/2016   Procedure: Venia Minks DILATION;  Surgeon: Daneil Dolin, MD;  [facility];  Service: Endoscopy;  Laterality: N/A;  . POLYPECTOMY  09/27/2016   Procedure: POLYPECTOMY;  Surgeon: Daneil Dolin, MD;  [facility];  Service: Endoscopy;;  colon  . ROTATOR CUFF REPAIR Bilateral   . TEE WITHOUT CARDIOVERSION N/A 07/07/2014   Procedure: TRANSESOPHAGEAL ECHOCARDIOGRAM (TEE);  Surgeon: Arnoldo Lenis, MD;  [facility];  Service: Cardiology;  Laterality: N/A;  . TEE WITHOUT CARDIOVERSION N/A 07/20/2014   Procedure: TRANSESOPHAGEAL ECHOCARDIOGRAM (TEE) WITH PROPOFOL;  Surgeon: Arnoldo Lenis, MD;  Location[facility];  Service: Endoscopy;  Laterality: N/A;  . TEE WITHOUT CARDIOVERSION N/A 10/07/2014   Procedure: TRANSESOPHAGEAL ECHOCARDIOGRAM (TEE);  Surgeon: Rexene Alberts, MD;  Location: [place];  Service: Open Heart Surgery;  Laterality: N/A;    Family History  Problem Relation Age of Onset  . Hypertension Mother   . Hypertension Father   . Heart disease Father        before age 64  . Other Father        varicose veins  . COPD Father   . Colon cancer Neg Hx   . Celiac disease Neg Hx   . Inflammatory bowel disease Neg Hx    Social History:  reports that she has been smoking cigarettes. She has a 20.00 pack-year smoking history. She has never used smokeless tobacco. She reports current alcohol use of about 2.0 standard drinks of alcohol per week. She reports that she does not use drugs.  Allergies:  Allergies  Allergen Reactions  . Penicillins Hives    Has patient had a PCN reaction causing immediate rash, facial/tongue/throat swelling, SOB or lightheadedness with hypotension: Yes Has patient had a PCN reaction causing severe rash involving mucus membranes or skin necrosis: No Has patient had a PCN reaction that required hospitalization No Has patient had a PCN reaction occurring within the last 10 years: No If all of the above answers are "NO", then may proceed with Cephalosporin use.  Have taken  Keflex before without any adverse reaction.   . Sulfa Antibiotics Nausea And Vomiting    No medications prior to admission.    No results found for this or any previous visit (from the past 48 hour(s)). No results found.  Review of Systems  There were no vitals taken for this visit. Physical Exam Constitutional:      General: She is not in acute distress.    Appearance: She is obese.  HENT:     Head: Normocephalic.     Nose: No congestion or rhinorrhea.  Eyes:     General: No scleral icterus.    Extraocular Movements: Extraocular movements intact.     Pupils: Pupils are equal, round, and reactive to light.  Cardiovascular:     Rate and Rhythm: Normal rate.     Pulses: Normal pulses.  Pulmonary:     Effort: Pulmonary effort is normal.  Abdominal:     General: Bowel sounds are normal.  Musculoskeletal:        General: Normal range of motion.     Cervical back: Neck supple.  Skin:    General: Skin is warm and dry.     Coloration: Skin  is not jaundiced or pale.     Findings: No bruising, erythema, lesion or rash.  Neurological:     Mental Status: She is alert.     Cranial Nerves: No cranial nerve deficit.     Sensory: Sensory deficit present.     Motor: Weakness present. No tremor, atrophy, abnormal muscle tone, seizure activity or pronator drift.     Coordination: Coordination normal.     Gait: Gait normal.     Deep Tendon Reflexes: Reflexes normal.     Comments: Median nerve compression test positive at the wrist Tinel's slightly positive flexion test positive right wrist  Psychiatric:        Mood and Affect: Mood normal.        Behavior: Behavior normal.        Thought Content: Thought content normal.        Judgment: Judgment normal.      Assessment/Plan Right carpal tunnel syndrome  Open right carpal tunnel release  Arther Abbott, MD 03/01/2020, 8:28 AM

## 2020-03-02 ENCOUNTER — Ambulatory Visit (HOSPITAL_COMMUNITY): Payer: BC Managed Care – PPO | Admitting: Anesthesiology

## 2020-03-02 ENCOUNTER — Other Ambulatory Visit: Payer: Self-pay

## 2020-03-02 ENCOUNTER — Ambulatory Visit (HOSPITAL_COMMUNITY)
Admission: RE | Admit: 2020-03-02 | Discharge: 2020-03-02 | Disposition: A | Discharge status: Home / Self Care | Payer: BC Managed Care – PPO | Attending: Orthopedic Surgery | Admitting: Orthopedic Surgery

## 2020-03-02 ENCOUNTER — Encounter (HOSPITAL_COMMUNITY): Payer: Self-pay | Admitting: Orthopedic Surgery

## 2020-03-02 ENCOUNTER — Encounter (HOSPITAL_COMMUNITY)
Admission: RE | Disposition: A | Discharge status: Home / Self Care | Payer: Self-pay | Source: Home / Self Care | Attending: Orthopedic Surgery

## 2020-03-02 DIAGNOSIS — Z882 Allergy status to sulfonamides status: Secondary | ICD-10-CM | POA: Insufficient documentation

## 2020-03-02 DIAGNOSIS — M659 Synovitis and tenosynovitis, unspecified: Secondary | ICD-10-CM | POA: Insufficient documentation

## 2020-03-02 DIAGNOSIS — G5601 Carpal tunnel syndrome, right upper limb: Secondary | ICD-10-CM | POA: Diagnosis not present

## 2020-03-02 DIAGNOSIS — F17210 Nicotine dependence, cigarettes, uncomplicated: Secondary | ICD-10-CM | POA: Insufficient documentation

## 2020-03-02 DIAGNOSIS — Z881 Allergy status to other antibiotic agents status: Secondary | ICD-10-CM | POA: Insufficient documentation

## 2020-03-02 DIAGNOSIS — Z20822 Contact with and (suspected) exposure to covid-19: Secondary | ICD-10-CM | POA: Insufficient documentation

## 2020-03-02 DIAGNOSIS — Z880 Allergy status to penicillin: Secondary | ICD-10-CM | POA: Insufficient documentation

## 2020-03-02 HISTORY — PX: CARPAL TUNNEL RELEASE: SHX101

## 2020-03-02 SURGERY — CARPAL TUNNEL RELEASE
Anesthesia: General | Site: Hand | Laterality: Right

## 2020-03-02 MED ORDER — CHLORHEXIDINE GLUCONATE 0.12 % MT SOLN
15.0000 mL | Freq: Once | OROMUCOSAL | Status: AC
  Administered 2020-03-02: 15 mL via OROMUCOSAL
  Filled 2020-03-02: qty 15

## 2020-03-02 MED ORDER — FENTANYL CITRATE (PF) 100 MCG/2ML IJ SOLN
INTRAMUSCULAR | Status: AC
  Filled 2020-03-02: qty 2

## 2020-03-02 MED ORDER — FENTANYL CITRATE (PF) 100 MCG/2ML IJ SOLN
INTRAMUSCULAR | Status: DC | PRN
  Administered 2020-03-02 (×4): 25 ug via INTRAVENOUS

## 2020-03-02 MED ORDER — LIDOCAINE HCL (PF) 1 % IJ SOLN
INTRAMUSCULAR | Status: AC
  Filled 2020-03-02: qty 30

## 2020-03-02 MED ORDER — PROPOFOL 10 MG/ML IV BOLUS
INTRAVENOUS | Status: AC
  Filled 2020-03-02: qty 40

## 2020-03-02 MED ORDER — MIDAZOLAM HCL 5 MG/5ML IJ SOLN
INTRAMUSCULAR | Status: DC | PRN
  Administered 2020-03-02: 2 mg via INTRAVENOUS

## 2020-03-02 MED ORDER — ORAL CARE MOUTH RINSE: 15.0000 mL | Freq: Once | OROMUCOSAL | Status: AC

## 2020-03-02 MED ORDER — HYDROMORPHONE HCL 1 MG/ML IJ SOLN: 0.2500 mg | INTRAMUSCULAR | Status: DC | PRN

## 2020-03-02 MED ORDER — VANCOMYCIN HCL 1000 MG IV SOLR
INTRAVENOUS | Status: DC | PRN
  Administered 2020-03-02: 1000 mg via INTRAVENOUS

## 2020-03-02 MED ORDER — PROMETHAZINE HCL 25 MG/ML IJ SOLN: 6.2500 mg | INTRAMUSCULAR | Status: DC | PRN

## 2020-03-02 MED ORDER — 0.9 % SODIUM CHLORIDE (POUR BTL) OPTIME
TOPICAL | Status: DC | PRN
  Administered 2020-03-02: 1000 mL

## 2020-03-02 MED ORDER — MEPERIDINE HCL 50 MG/ML IJ SOLN: 6.2500 mg | INTRAMUSCULAR | Status: DC | PRN

## 2020-03-02 MED ORDER — HYDROCODONE-ACETAMINOPHEN 5-325 MG PO TABS: 1.0000 | ORAL_TABLET | ORAL | 0 refills | Status: AC | PRN

## 2020-03-02 MED ORDER — ACETAMINOPHEN 500 MG PO TABS
500.0000 mg | ORAL_TABLET | Freq: Once | ORAL | Status: AC
  Administered 2020-03-02: 500 mg via ORAL
  Filled 2020-03-02: qty 1

## 2020-03-02 MED ORDER — MIDAZOLAM HCL 2 MG/2ML IJ SOLN
INTRAMUSCULAR | Status: AC
  Filled 2020-03-02: qty 2

## 2020-03-02 MED ORDER — BUPIVACAINE HCL (PF) 0.5 % IJ SOLN
INTRAMUSCULAR | Status: AC
  Filled 2020-03-02: qty 30

## 2020-03-02 MED ORDER — BUPIVACAINE HCL (PF) 0.5 % IJ SOLN
INTRAMUSCULAR | Status: DC | PRN
  Administered 2020-03-02: 10 mL

## 2020-03-02 MED ORDER — PROPOFOL 500 MG/50ML IV EMUL
INTRAVENOUS | Status: DC | PRN
  Administered 2020-03-02: 50 mg via INTRAVENOUS
  Administered 2020-03-02: 100 ug/kg/min via INTRAVENOUS

## 2020-03-02 MED ORDER — VANCOMYCIN HCL IN DEXTROSE 1-5 GM/200ML-% IV SOLN
1000.0000 mg | INTRAVENOUS | Status: DC
  Filled 2020-03-02: qty 200

## 2020-03-02 MED ORDER — LIDOCAINE HCL 1 % IJ SOLN
INTRAMUSCULAR | Status: DC | PRN
  Administered 2020-03-02: 10 mL

## 2020-03-02 MED ORDER — LACTATED RINGERS IV SOLN: INTRAVENOUS | Status: DC | PRN

## 2020-03-02 MED ORDER — LACTATED RINGERS IV SOLN: Freq: Once | INTRAVENOUS | Status: AC

## 2020-03-02 MED ORDER — IBUPROFEN 800 MG PO TABS
800.0000 mg | ORAL_TABLET | Freq: Once | ORAL | Status: AC
  Administered 2020-03-02: 800 mg via ORAL
  Filled 2020-03-02: qty 1

## 2020-03-02 SURGICAL SUPPLY — 42 items
APL PRP STRL LF DISP 70% ISPRP (MISCELLANEOUS) ×1
BANDAGE ESMARK 4X12 BL STRL LF (DISPOSABLE) ×1 IMPLANT
BLADE SURG 15 STRL LF DISP TIS (BLADE) ×1 IMPLANT
BLADE SURG 15 STRL SS (BLADE) ×3
BNDG CMPR 12X4 ELC STRL LF (DISPOSABLE) ×1
BNDG CMPR STD VLCR NS LF 5.8X3 (GAUZE/BANDAGES/DRESSINGS) ×1
BNDG COHESIVE 4X5 TAN STRL (GAUZE/BANDAGES/DRESSINGS) ×3 IMPLANT
BNDG ELASTIC 3X5.8 VLCR NS LF (GAUZE/BANDAGES/DRESSINGS) ×3 IMPLANT
BNDG ESMARK 4X12 BLUE STRL LF (DISPOSABLE) ×3
BNDG GAUZE ELAST 4 BULKY (GAUZE/BANDAGES/DRESSINGS) ×3 IMPLANT
CHLORAPREP W/TINT 26 (MISCELLANEOUS) ×3 IMPLANT
CLOTH BEACON ORANGE TIMEOUT ST (SAFETY) ×3 IMPLANT
COVER LIGHT HANDLE STERIS (MISCELLANEOUS) ×6 IMPLANT
COVER WAND RF STERILE (DRAPES) ×3 IMPLANT
CUFF TOURN SGL QUICK 18X4 (TOURNIQUET CUFF) ×3 IMPLANT
DECANTER SPIKE VIAL GLASS SM (MISCELLANEOUS) ×5 IMPLANT
DRAPE HALF SHEET 40X57 (DRAPES) ×3 IMPLANT
DRSG XEROFORM 1X8 (GAUZE/BANDAGES/DRESSINGS) ×3 IMPLANT
ELECT NDL TIP 2.8 STRL (NEEDLE) ×1 IMPLANT
ELECT NEEDLE TIP 2.8 STRL (NEEDLE) ×3 IMPLANT
ELECT REM PT RETURN 9FT ADLT (ELECTROSURGICAL) ×3
ELECTRODE REM PT RTRN 9FT ADLT (ELECTROSURGICAL) ×1 IMPLANT
GAUZE SPONGE 4X4 12PLY STRL (GAUZE/BANDAGES/DRESSINGS) ×3 IMPLANT
GLOVE BIOGEL PI IND STRL 7.0 (GLOVE) ×2 IMPLANT
GLOVE BIOGEL PI INDICATOR 7.0 (GLOVE) ×4
GLOVE SKINSENSE NS SZ8.0 LF (GLOVE) ×2
GLOVE SKINSENSE STRL SZ8.0 LF (GLOVE) ×1 IMPLANT
GLOVE SS N UNI LF 8.5 STRL (GLOVE) ×3 IMPLANT
GOWN STRL REUS W/TWL LRG LVL3 (GOWN DISPOSABLE) ×3 IMPLANT
GOWN STRL REUS W/TWL XL LVL3 (GOWN DISPOSABLE) ×3 IMPLANT
KIT TURNOVER KIT A (KITS) ×3 IMPLANT
MANIFOLD NEPTUNE II (INSTRUMENTS) ×3 IMPLANT
NDL HYPO 21X1.5 SAFETY (NEEDLE) ×1 IMPLANT
NEEDLE HYPO 21X1.5 SAFETY (NEEDLE) ×3 IMPLANT
NS IRRIG 1000ML POUR BTL (IV SOLUTION) ×3 IMPLANT
PACK BASIC LIMB (CUSTOM PROCEDURE TRAY) ×3 IMPLANT
PAD ARMBOARD 7.5X6 YLW CONV (MISCELLANEOUS) ×5 IMPLANT
POSITIONER HAND ALUMI XLG (MISCELLANEOUS) ×3 IMPLANT
SET BASIN LINEN APH (SET/KITS/TRAYS/PACK) ×3 IMPLANT
SUT ETHILON 3 0 FSL (SUTURE) ×3 IMPLANT
SYR BULB IRRIG 60ML STRL (SYRINGE) ×2 IMPLANT
SYR CONTROL 10ML LL (SYRINGE) ×5 IMPLANT

## 2020-03-02 NOTE — Op Note (Signed)
03/02/2020 29:21 AM  64 year old female with right upper extremity paresthesias and pain, found to have carpal tunnel syndrome right upper extremity opted for surgical treatment versus continued nonoperative management  Preop diagnosis right carpal tunnel syndrome  Postop diagnosis same  Procedure open carpal tunnel release right hand and wrist  Surgeon [place]  Anesthesia local plus IV sedation.  10 cc 1% local lidocaine plain  Findings compression of the median nerve discoloration and apple core appearance  No space-occupying lesions were noted  No complications sponge needle count were correct  Details of procedure patient was seen in the preop and cleared for surgery surgical site confirmed his right carpal tunnel surgical site marked  Patient taken to the operating room for IV sedation in the supine position with a hand table for the right arm  After sterile prep and drape and timeout the incision site was injected with 10 cc 1% plain lidocaine  Tourniquet was then elevated to 220 mmHg after exsanguination with a 4 inch Esmarch  The incision was made in line with the radial border of the ring finger starting at the distal aspect of the carpal tunnel and taking up to the distal wrist crease subcutaneous tissue was divided bluntly and further blunt dissection was carried out to find the end of the carpal tunnel after which blunt dissection was carried out beneath the ligament and then the ligament was released sharply.  The carpal tunnel was evaluated there was minimal synovitis apple core appearance gray discoloration on the nerve with no ganglion cyst  The wound was irrigated and closed with a combination of interrupted and running 3-0 nylon suture and then the incision was injected with half percent plain bupivacaine  Dressing was applied tourniquet was released Ace bandage was applied  Patient taken recovery room in stable condition

## 2020-03-02 NOTE — Progress Notes (Signed)
Called Dr. Ruthe Mannan office to get a follow-up appt scheduled for patient. The scheduler said they are working on it and will call patient to inform her of that date and time. This RN called patient to let her know of this LVM to phone.

## 2020-03-02 NOTE — Anesthesia Preprocedure Evaluation (Signed)
Anesthesia Evaluation  Patient identified by MRN, date of birth, ID band Patient awake    Reviewed: Allergy & Precautions, NPO status , Patient's Chart, lab work & pertinent test results, reviewed documented beta blocker date and time   History of Anesthesia Complications (+) Family history of anesthesia reaction  Airway Mallampati: II  TM Distance: >3 FB Neck ROM: Full    Dental  (+) Missing, Dental Advisory Given   Pulmonary sleep apnea , Current Smoker and Patient abstained from smoking.,    Pulmonary exam normal breath sounds clear to auscultation       Cardiovascular Exercise Tolerance: Good hypertension, Pt. on medications and Pt. 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 Wall thickness was  normal. Systolic function was vigorous. The estimated ejection  fraction was in the range of 65% to 70%. Wall motion was normal;  there were no regional wall motion abnormalities. Doppler  parameters are consistent with abnormal left ventricular  relaxation (grade 1 diastolic dysfunction). Doppler parameters  are consistent with indeterminate ventricular filling pressure.  - Aortic valve: 21 mm Medtronic Freestyle stentless porcine valve/aortic root graft in place. There was no significant regurgitation. There was no stenosis. Peak gradient (S): 11 mm Hg.  - Mitral valve: Mildly calcified annulus. Normal thickness leaflets  .  - Tricuspid valve: There was mild regurgitation.  - Pulmonary arteries: PA peak pressure: 35 mm Hg (S).    Neuro/Psych Anxiety  Neuromuscular disease    GI/Hepatic Neg liver ROS, GERD  Medicated,  Endo/Other  negative endocrine ROS  Renal/GU negative  Renal ROS     Musculoskeletal  (+) Arthritis ,   Abdominal   Peds  Hematology negative hematology ROS (+)   Anesthesia Other Findings   Reproductive/Obstetrics                             Anesthesia Physical Anesthesia Plan  ASA: III  Anesthesia Plan: General   Post-op Pain Management:    Induction: Intravenous  PONV Risk Score and Plan: TIVA  Airway Management Planned: Nasal Cannula, Natural Airway and Simple Face Mask  Additional Equipment:   Intra-op Plan:   Post-operative Plan:   Informed Consent: I have reviewed the patients History and Physical, chart, labs and discussed the procedure including the risks, benefits and alternatives for the proposed anesthesia with the patient or authorized representative who has indicated his/her understanding and acceptance.     Dental advisory given  Plan Discussed with: CRNA and Surgeon  Anesthesia Plan Comments: (General with local block by Dr. Aline Brochure)        Anesthesia Quick Evaluation

## 2020-03-02 NOTE — Transfer of Care (Signed)
Immediate Anesthesia Transfer of Care Note  Patient: Michelle Patton  Procedure(s) Performed: CARPAL TUNNEL RELEASE (Right Hand)  Patient Location: PACU  Anesthesia Type:MAC  Level of Consciousness: awake, alert , oriented and patient cooperative  Airway & Oxygen Therapy: Patient Spontanous Breathing  Post-op Assessment: Report given to RN, Post -op Vital signs reviewed and stable and Patient moving all extremities  Post vital signs: Reviewed and stable  Last Vitals:  Vitals Value Taken Time  BP    Temp    Pulse    Resp    SpO2      Last Pain:  Vitals:   03/02/20 0644  TempSrc: Oral  PainSc: 4          Complications: No complications documented.

## 2020-03-02 NOTE — Anesthesia Postprocedure Evaluation (Signed)
Anesthesia Post Note  Patient: Michelle Patton  Procedure(s) Performed: CARPAL TUNNEL RELEASE (Right Hand)  Patient location during evaluation: PACU Anesthesia Type: MAC Level of consciousness: awake, oriented, awake and alert and patient cooperative Pain management: pain level controlled Vital Signs Assessment: post-procedure vital signs reviewed and stable Respiratory status: spontaneous breathing, respiratory function stable and nonlabored ventilation Cardiovascular status: blood pressure returned to baseline and stable Postop Assessment: no headache and no backache Anesthetic complications: no   No complications documented.   Last Vitals:  Vitals:   03/02/20 0644  BP: 118/70  Pulse: 64  Resp: 16  Temp: 36.7 C  SpO2: 97%    Last Pain:  Vitals:   03/02/20 0644  TempSrc: Oral  PainSc: 4                  Tacy Learn

## 2020-03-02 NOTE — Interval H&P Note (Signed)
History and Physical Interval Note:  03/02/2020 7:18 AM  Michelle Patton  has presented today for surgery, with the diagnosis of right Carpal tunnel syndrome.  The various methods of treatment have been discussed with the patient and family. After consideration of risks, benefits and other options for treatment, the patient has consented to  Procedure(s): CARPAL TUNNEL RELEASE (Right) as a surgical intervention.  The patient's history has been reviewed, patient examined, no change in status, stable for surgery.  I have reviewed the patient's chart and labs.  Questions were answered to the patient's satisfaction.     Arther Abbott

## 2020-03-02 NOTE — Brief Op Note (Signed)
03/02/2020  8:01 AM  PATIENT:  Michelle Patton  64 y.o. female  PRE-OPERATIVE DIAGNOSIS:  right Carpal tunnel syndrome  POST-OPERATIVE DIAGNOSIS:  right Carpal tunnel syndrome  PROCEDURE:  Procedure(s): CARPAL TUNNEL RELEASE (Right)  SURGEON:  Surgeon(s) and Role:    Carole Civil, MD - Primary  PHYSICIAN ASSISTANT:   ASSISTANTS: none   ANESTHESIA:   local and IV sedation  EBL:  none   BLOOD ADMINISTERED:none  DRAINS: none   LOCAL MEDICATIONS USED:  MARCAINE    and LIDOCAINE   SPECIMEN:  No Specimen  DISPOSITION OF SPECIMEN:  N/A  COUNTS:  YES  TOURNIQUET:   Total Tourniquet Time Documented: Upper Arm (laterality) - 15 minutes Total: Upper Arm (laterality) - 15 minutes   DICTATION: .Dragon Dictation  PLAN OF CARE: Discharge to home after PACU  PATIENT DISPOSITION:  PACU - hemodynamically stable.   Delay start of Pharmacological VTE agent (>24hrs) due to surgical blood loss or risk of bleeding: not applicable

## 2020-03-03 ENCOUNTER — Encounter (HOSPITAL_COMMUNITY): Payer: Self-pay | Admitting: Orthopedic Surgery

## 2020-03-03 ENCOUNTER — Other Ambulatory Visit (HOSPITAL_COMMUNITY): Payer: BC Managed Care – PPO

## 2020-03-08 ENCOUNTER — Telehealth: Payer: Self-pay | Admitting: Internal Medicine

## 2020-03-08 NOTE — Telephone Encounter (Signed)
Patient returned call. She has been rescheduled to 1/13 at 7:30am. Aware will mail new covid test appt with new instructions. She voiced understanding. Called endo and made aware

## 2020-03-08 NOTE — Telephone Encounter (Signed)
Called pt. LMOVM

## 2020-03-08 NOTE — Telephone Encounter (Signed)
Patient left message that she needs to reschedule her procedure

## 2020-03-09 ENCOUNTER — Other Ambulatory Visit (HOSPITAL_COMMUNITY)
Admission: RE | Admit: 2020-03-09 | Discharge: 2020-03-09 | Disposition: A | Discharge status: Home / Self Care | Payer: BC Managed Care – PPO | Source: Ambulatory Visit | Attending: Internal Medicine | Admitting: Internal Medicine

## 2020-03-10 ENCOUNTER — Other Ambulatory Visit: Payer: Self-pay

## 2020-03-10 ENCOUNTER — Ambulatory Visit (INDEPENDENT_AMBULATORY_CARE_PROVIDER_SITE_OTHER): Payer: BC Managed Care – PPO | Admitting: Orthopedic Surgery

## 2020-03-10 DIAGNOSIS — Z9889 Other specified postprocedural states: Secondary | ICD-10-CM

## 2020-03-10 NOTE — Progress Notes (Signed)
Chief Complaint  Patient presents with  . Follow-up    Recheck on right CTR, DOS 03-02-20    POD 8   03/02/2020   64 year old female with right upper extremity paresthesias and pain, found to have carpal tunnel syndrome right upper extremity opted for surgical treatment versus continued nonoperative management   Preop diagnosis right carpal tunnel syndrome   Postop diagnosis same   Procedure open carpal tunnel release right hand and wrist  And is doing well she is getting good relief from her carpal tunnel release her wound looks good sutures out next week

## 2020-03-10 NOTE — Patient Instructions (Signed)
Keep moving the fingers

## 2020-03-15 ENCOUNTER — Other Ambulatory Visit: Payer: Self-pay

## 2020-03-15 ENCOUNTER — Ambulatory Visit (HOSPITAL_COMMUNITY)
Admission: RE | Admit: 2020-03-15 | Discharge: 2020-03-15 | Disposition: A | Discharge status: Home / Self Care | Payer: BC Managed Care – PPO | Source: Ambulatory Visit | Attending: Internal Medicine | Admitting: Internal Medicine

## 2020-03-15 DIAGNOSIS — Z13820 Encounter for screening for osteoporosis: Secondary | ICD-10-CM | POA: Diagnosis present

## 2020-03-15 DIAGNOSIS — N951 Menopausal and female climacteric states: Secondary | ICD-10-CM | POA: Diagnosis present

## 2020-03-17 ENCOUNTER — Ambulatory Visit (INDEPENDENT_AMBULATORY_CARE_PROVIDER_SITE_OTHER): Payer: BC Managed Care – PPO | Admitting: Orthopedic Surgery

## 2020-03-17 ENCOUNTER — Other Ambulatory Visit: Payer: Self-pay

## 2020-03-17 VITALS — Ht 66.0 in | Wt 202.0 lb

## 2020-03-17 DIAGNOSIS — Z9889 Other specified postprocedural states: Secondary | ICD-10-CM

## 2020-03-17 NOTE — Progress Notes (Signed)
Pt verified and sutures removed from right hand after Right CTR, pt tolerated. Pt stated that she had some bleeding on 03/16/20. Pt advised to return next week for a follow up with Dr. Aline Brochure. Wound dressed and daily care regimen explained (Keep hand dry/clean and contact us with any abnormal swelling, bleeding, redness, fever).

## 2020-03-24 NOTE — Progress Notes (Signed)
Nursing note reviewed.

## 2020-03-25 ENCOUNTER — Encounter: Payer: Self-pay | Admitting: Orthopedic Surgery

## 2020-03-25 ENCOUNTER — Other Ambulatory Visit: Payer: Self-pay

## 2020-03-25 ENCOUNTER — Ambulatory Visit (INDEPENDENT_AMBULATORY_CARE_PROVIDER_SITE_OTHER): Payer: BC Managed Care – PPO | Admitting: Orthopedic Surgery

## 2020-03-25 DIAGNOSIS — Z9889 Other specified postprocedural states: Secondary | ICD-10-CM

## 2020-03-25 MED ORDER — DOXYCYCLINE HYCLATE 100 MG PO TABS: 100.0000 mg | ORAL_TABLET | Freq: Two times a day (BID) | ORAL | 1 refills | Status: DC

## 2020-03-25 NOTE — Progress Notes (Signed)
Post op   Chief Complaint  Patient presents with  . Routine Post Op    right carpal tunnel release on 03/02/20 incision open some at palm    Encounter Diagnosis  Name Primary?  . s/p right carpal tunnel release 03/02/20 Yes    S/p right CTR 03/02/20  She has a superficial infection   The epidermis has opened some   There is erythema aroung the ulna edge of the wound   rec  Soap and water  Pat dry  Neosporin Doxy due to pcn sulfa allegey  u 2 weeks  Meds ordered this encounter  Medications  . doxycycline (VIBRA-TABS) 100 MG tablet    Sig: Take 1 tablet (100 mg total) by mouth 2 (two) times daily.    Dispense:  28 tablet    Refill:  1

## 2020-03-25 NOTE — Patient Instructions (Signed)
Wash soap water   Dry   Cover   Take  Meds ordered this encounter  Medications  . doxycycline (VIBRA-TABS) 100 MG tablet    Sig: Take 1 tablet (100 mg total) by mouth 2 (two) times daily.    Dispense:  28 tablet    Refill:  1

## 2020-03-26 ENCOUNTER — Encounter (HOSPITAL_COMMUNITY): Payer: Self-pay | Admitting: Orthopedic Surgery

## 2020-04-01 ENCOUNTER — Encounter (INDEPENDENT_AMBULATORY_CARE_PROVIDER_SITE_OTHER): Payer: Self-pay | Admitting: Internal Medicine

## 2020-04-01 ENCOUNTER — Ambulatory Visit (INDEPENDENT_AMBULATORY_CARE_PROVIDER_SITE_OTHER): Payer: BC Managed Care – PPO | Admitting: Internal Medicine

## 2020-04-01 ENCOUNTER — Other Ambulatory Visit: Payer: Self-pay

## 2020-04-01 VITALS — BP 146/86 | HR 73 | Temp 97.0°F | Ht 66.0 in | Wt 207.0 lb

## 2020-04-01 DIAGNOSIS — E782 Mixed hyperlipidemia: Secondary | ICD-10-CM | POA: Diagnosis not present

## 2020-04-01 DIAGNOSIS — I10 Essential (primary) hypertension: Secondary | ICD-10-CM | POA: Diagnosis not present

## 2020-04-01 DIAGNOSIS — N951 Menopausal and female climacteric states: Secondary | ICD-10-CM | POA: Diagnosis not present

## 2020-04-01 DIAGNOSIS — Z1231 Encounter for screening mammogram for malignant neoplasm of breast: Secondary | ICD-10-CM

## 2020-04-01 NOTE — Progress Notes (Signed)
Metrics: Intervention Frequency ACO  Documented Smoking Status Yearly  Screened one or more times in 24 months  Cessation Counseling or  Active cessation medication Past 24 months  Past 24 months   Guideline developer: UpToDate (See UpToDate for funding source) Date Released: 2014       Wellness Office Visit  Subjective:  Patient ID: Michelle Patton, female    DOB: [birth year]  Age: 64 y.o. MRN: [id number]  CC: This lady comes in for follow-up of menopausal symptoms including hot flashes.  She also has hypertension and hyperlipidemia which is quite severe and she takes statin therapy. HPI  She does not really want to take statin therapy but has been put on this medication. She is tolerating NP thyroid for symptoms of thyroid hypofunction at the present dose. Her hot flashes have now completely resolved with institution of estradiol at a small dose and increasing the progesterone at night. She did have carpal tunnel syndrome surgery and she is doing well. Past Medical History:  Diagnosis Date  . Anxiety   . Aortic regurgitation 07/20/2014  . Aortic stenosis, severe 07/20/2014  . Arthritis   . DVT (deep venous thrombosis) ([place])   . Family history of adverse reaction to anesthesia    Reports father deliurm in his 73's with CABG  . GERD (gastroesophageal reflux disease)   . History of pneumonia   . Hyperlipidemia   . Hypertension   . Insomnia, unspecified   . Muscle weakness (generalized)   . Peripheral artery disease ([place])   . 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 Sleep apnea    diagnosed multiple years ago at [facility]  . Supravalvular aortic stenosis, congenital - s/p repair during childhood    Past Surgical History:  Procedure Laterality Date  . ABDOMINAL AORTAGRAM  06/24/12  . ABDOMINAL AORTAGRAM N/A  06/24/2012   Procedure: ABDOMINAL Maxcine Ham;  Surgeon: Angelia Mould, MD;  Location: [facility] CATH LAB;  Service: Cardiovascular;  Laterality: N/A;  . AORTIC VALVE REPLACEMENT N/A 10/07/2014   Procedure: REDO AORTIC VALVE REPLACEMENT (AVR);  Surgeon: Rexene Alberts, MD;  Location: [place];  Service: Open Heart Surgery;  Laterality: N/A;  . ASCENDING AORTIC ROOT REPLACEMENT N/A 10/07/2014   Procedure: ASCENDING AORTIC ROOT REPLACEMENT;  Surgeon: Rexene Alberts, MD;  Location: [place];  Service: Open Heart Surgery;  Laterality: N/A;  . BIOPSY  09/27/2016   Procedure: BIOPSY;  Surgeon: Daneil Dolin, MD;  [facility];  Service: Endoscopy;;  colon  . BREAST REDUCTION SURGERY Bilateral 01/21/2018   Procedure: BREAST REDUCTION WITH LIPOSUCTION;  Surgeon: Cristine Polio, MD;  Location: [place];  Service: Plastics;  Laterality: Bilateral;  . [place]  . CARPAL TUNNEL RELEASE Right 03/02/2020   Procedure: CARPAL TUNNEL RELEASE;  Surgeon: Carole Civil, MD;  Location: [facility];  Service: Orthopedics;  Laterality: Right;  . CATARACT EXTRACTION W/PHACO Left 05/30/2019   Procedure: CATARACT EXTRACTION PHACO AND INTRAOCULAR LENS PLACEMENT (IOC) (CDE: 4.94  );  Surgeon: Baruch Goldmann, MD;  Location: [facility];  Service: Ophthalmology;  Laterality: Left;  . CATARACT EXTRACTION W/PHACO Right 06/16/2019   Procedure: CATARACT EXTRACTION PHACO AND INTRAOCULAR LENS PLACEMENT (IOC);  Surgeon: Baruch Goldmann, MD;  Location: [facility];  Service: Ophthalmology;  Laterality: Right;  CDE: 4.64  . CERVICAL  FUSION    . CHOLECYSTECTOMY    . COLONOSCOPY WITH PROPOFOL N/A 09/27/2016   Dr. Gala Romney: Diverticulosis, several tubular adenomas removed ranging 4 to 7 mm in size, internal grade 1 hemorrhoids, terminal ileum normal, segmental biopsies negative for microscopic colitis.  Next colonoscopy June 2021  . ESOPHAGOGASTRODUODENOSCOPY (EGD) WITH PROPOFOL N/A 09/27/2016   Dr. Gala Romney: Small  hiatal hernia, mild Schatzki ring status post disruption, LA grade a esophagitis  . ILIAC ARTERY STENT Left 12/2007  . KNEE ARTHROSCOPY WITH MEDIAL MENISECTOMY Right 01/10/2018   Procedure: RIGHT KNEE ARTHROSCOPY WITH PARTIAL MEDIAL MENISECTOMY;  Surgeon: Carole Civil, MD;  Location: [facility];  Service: Orthopedics;  Laterality: Right;  . LEFT AND RIGHT HEART CATHETERIZATION WITH CORONARY ANGIOGRAM N/A 07/31/2014   Procedure: LEFT AND RIGHT HEART CATHETERIZATION WITH CORONARY ANGIOGRAM;  Surgeon: Burnell Blanks, MD;  Location: [facility] CATH LAB;  Service: Cardiovascular;  Laterality: N/A;  . Venia Minks DILATION N/A 09/27/2016   Procedure: Venia Minks DILATION;  Surgeon: Daneil Dolin, MD;  [facility];  Service: Endoscopy;  Laterality: N/A;  . POLYPECTOMY  09/27/2016   Procedure: POLYPECTOMY;  Surgeon: Daneil Dolin, MD;  [facility];  Service: Endoscopy;;  colon  . ROTATOR CUFF REPAIR Bilateral   . TEE WITHOUT CARDIOVERSION N/A 07/07/2014   Procedure: TRANSESOPHAGEAL ECHOCARDIOGRAM (TEE);  Surgeon: Arnoldo Lenis, MD;  [facility];  Service: Cardiology;  Laterality: N/A;  . TEE WITHOUT CARDIOVERSION N/A 07/20/2014   Procedure: TRANSESOPHAGEAL ECHOCARDIOGRAM (TEE) WITH PROPOFOL;  Surgeon: Arnoldo Lenis, MD;  [facility];  Service: Endoscopy;  Laterality: N/A;  . TEE WITHOUT CARDIOVERSION N/A 10/07/2014   Procedure: TRANSESOPHAGEAL ECHOCARDIOGRAM (TEE);  Surgeon: Rexene Alberts, MD;  Location: [place];  Service: Open Heart Surgery;  Laterality: N/A;     Family History  Problem Relation Age of Onset  . Hypertension Mother   . Hypertension Father   . Heart disease Father        before age 35  . Other Father        varicose veins  . COPD Father   . Colon cancer Neg Hx   . Celiac disease Neg Hx   . Inflammatory bowel disease Neg Hx     Social History   Social History Narrative   Patient is married   for 5 years . Patient works at [employer].. Some college education-did not Writer.Originally from [place].   Social History   Tobacco Use  . Smoking status: Current Every Day Smoker    Packs/day: 0.50    Years: 40.00    Pack years: 20.00    Types: Cigarettes  . Smokeless tobacco: Never Used  Substance Use Topics  . Alcohol use: Yes    Alcohol/week: 2.0 standard drinks    Types: 2 Cans of beer per week    Current Meds  Medication Sig  . aspirin EC 81 MG tablet Take 1 tablet (81 mg total) by mouth daily. Swallow whole.  . Cholecalciferol (VITAMIN D3) 5000 UNITS TABS Take 10,000 Units by mouth every morning.   . diclofenac (VOLTAREN) 75 MG EC tablet TAKE 1 TABLET TWICE A DAY (DO NOT CRUSH) (Patient taking differently: Take 75 mg by mouth 2 (two) times daily.)  . doxycycline (VIBRA-TABS) 100 MG tablet Take 1 tablet (100 mg total) by mouth 2 (two) times daily.  [name] estradiol (ESTRACE) 0.5 MG tablet Take 1 tablet (0.5 mg total) by mouth daily.  [name] ibuprofen (  ADVIL) 600 MG tablet Take 1 tablet (600 mg total) by mouth every 8 (eight) hours as needed for moderate pain.  [name] losartan (COZAAR) 25 MG tablet TAKE 1 TABLET DAILY (Patient taking differently: Take 25 mg by mouth daily.)  . melatonin 5 MG TABS Take 5 mg by mouth at bedtime.  . metoprolol tartrate (LOPRESSOR) 25 MG tablet TAKE 1 TABLET TWICE A DAY (Patient taking differently: Take 25 mg by mouth 2 (two) times daily.)  . Multiple Minerals (CALCIUM-MAGNESIUM-ZINC) TABS Take 1 tablet by mouth daily.  . Multiple Vitamin (MULTIVITAMIN WITH MINERALS) TABS tablet Take 1 tablet by mouth daily.  . NP THYROID 90 MG tablet Take 1 tablet (90 mg total) by mouth daily.  . progesterone (PROMETRIUM) 200 MG capsule Take 2 capsules (400 mg total) by mouth daily. (Patient taking differently: Take 400 mg by mouth at bedtime.)  . RABEprazole (ACIPHEX) 20 MG tablet Take 1 tablet (20 mg total) by mouth 2 (two) times daily before a meal.  . rosuvastatin (CRESTOR) 20 MG tablet Take 1  tablet (20 mg total) by mouth daily.  . vitamin B-12 (CYANOCOBALAMIN) 1000 MCG tablet Take 1,000 mcg by mouth daily.      Depression screen [facility] 2/9 07/16/2019 04/07/2019 12/09/2018 11/04/2014  Decreased Interest 0 0 0 0  Down, Depressed, Hopeless 0 0 0 1  PHQ - 2 Score 0 0 0 1  Some recent data might be hidden     Objective:   Today's Vitals: BP (!) 146/86   Pulse 73   Temp (!) 97 F (36.1 C) (Temporal)   Ht 5\' 6"  (1.676 m)   Wt 207 lb (93.9 kg)   SpO2 95%   BMI 33.41 kg/m  Vitals with BMI 04/01/2020 03/17/2020 03/02/2020  Height 5\' 6"  5\' 6"  -  Weight 207 lbs 202 lbs -  BMI 26.20 35.59 -  Systolic 741 - 638  Diastolic 86 - 80  Pulse 73 - 60     Physical Exam   She looks systemically well, remains obese.  Blood pressure somewhat elevated today.    Assessment   1. Hot flashes due to menopause   2. Essential hypertension   3. Mixed hyperlipidemia   4. Encounter for screening mammogram for malignant neoplasm of breast       Tests ordered Orders Placed This Encounter  Procedures  . MM 3D SCREEN BREAST BILATERAL  . Progesterone  . Estradiol  . T3, free  . TSH     Plan: 1. She will continue with estradiol and progesterone and I will check levels today.  The aim is to optimize both. 2. She will continue with desiccated NP thyroid for symptoms of thyroid hypofunction and again we will optimize depending on results. 3. We discussed statin therapy in detail today.  We discussed the perceived benefits but this leads to only 2 to 3% reduction in events and no significant reduction in mortality.  On the other hand statins increase insulin resistance, increase the risk of diabetes, increase the risk of breast cancer, colon cancer.  After this conversation and and shared decision making, we decided discontinued statin so she will stop it.  We will work on reducing visceral fat which will reduce her cholesterol and improve cardiac risk factor. 4. Follow-up in 2 months.  We  will organize a mammogram in the meantime.   No orders of the defined types were placed in this encounter.   Doree Albee, MD

## 2020-04-02 LAB — TSH: TSH: 0.34 mIU/L — ABNORMAL LOW (ref 0.40–4.50)

## 2020-04-02 LAB — ESTRADIOL: Estradiol: 23 pg/mL

## 2020-04-02 LAB — PROGESTERONE: Progesterone: 12.1 ng/mL

## 2020-04-02 LAB — T3, FREE: T3, Free: 2.9 pg/mL (ref 2.3–4.2)

## 2020-04-05 ENCOUNTER — Other Ambulatory Visit: Payer: Self-pay | Admitting: Cardiology

## 2020-04-05 ENCOUNTER — Ambulatory Visit: Payer: BC Managed Care – PPO | Admitting: Orthopedic Surgery

## 2020-04-05 ENCOUNTER — Other Ambulatory Visit (INDEPENDENT_AMBULATORY_CARE_PROVIDER_SITE_OTHER): Payer: Self-pay | Admitting: Internal Medicine

## 2020-04-05 MED ORDER — NP THYROID 120 MG PO TABS: 120.0000 mg | ORAL_TABLET | Freq: Every day | ORAL | 3 refills | Status: DC

## 2020-04-05 MED ORDER — ESTRADIOL 1 MG PO TABS: 1.0000 mg | ORAL_TABLET | Freq: Every day | ORAL | 3 refills | Status: DC

## 2020-04-06 DIAGNOSIS — Z9889 Other specified postprocedural states: Secondary | ICD-10-CM | POA: Insufficient documentation

## 2020-04-08 ENCOUNTER — Other Ambulatory Visit: Payer: Self-pay

## 2020-04-08 ENCOUNTER — Ambulatory Visit (INDEPENDENT_AMBULATORY_CARE_PROVIDER_SITE_OTHER): Payer: BC Managed Care – PPO | Admitting: Orthopedic Surgery

## 2020-04-08 ENCOUNTER — Encounter: Payer: Self-pay | Admitting: Orthopedic Surgery

## 2020-04-08 DIAGNOSIS — Z9889 Other specified postprocedural states: Secondary | ICD-10-CM

## 2020-04-08 DIAGNOSIS — G5602 Carpal tunnel syndrome, left upper limb: Secondary | ICD-10-CM

## 2020-04-08 MED ORDER — PREGABALIN 50 MG PO CAPS: 50.0000 mg | ORAL_CAPSULE | Freq: Every day | ORAL | 2 refills | Status: DC

## 2020-04-08 NOTE — Progress Notes (Signed)
Chief Complaint  Patient presents with  . Post-op Follow-up    Right CTR 03/12/20    Encounter Diagnosis  Name Primary?  . s/p right carpal tunnel release 03/02/20 Yes    5 weeks status post carpal tunnel release right upper extremity; we did give her some antibiotics for redness around the incision which has improved she has full range of motion of the right hand and basically no symptoms however, she does complain of burning pain in the left hand at night and we have decided to try some Lyrica and a nighttime splint  She is interested in surgery on the left hand we discussed not waiting for a year and probably settle around sometime March or April  She will call us and let us know when she is ready for surgery on the left wrist for carpal tunnel release

## 2020-04-22 ENCOUNTER — Ambulatory Visit: Payer: BC Managed Care – PPO | Admitting: Neurology

## 2020-05-05 ENCOUNTER — Encounter (HOSPITAL_COMMUNITY): Payer: Self-pay | Admitting: Internal Medicine

## 2020-05-05 ENCOUNTER — Other Ambulatory Visit: Payer: Self-pay

## 2020-05-05 ENCOUNTER — Other Ambulatory Visit (HOSPITAL_COMMUNITY)
Admission: RE | Admit: 2020-05-05 | Discharge: 2020-05-05 | Disposition: A | Discharge status: Home / Self Care | Payer: BC Managed Care – PPO | Source: Ambulatory Visit | Attending: Internal Medicine | Admitting: Internal Medicine

## 2020-05-05 DIAGNOSIS — Z7982 Long term (current) use of aspirin: Secondary | ICD-10-CM | POA: Diagnosis not present

## 2020-05-05 DIAGNOSIS — Z01812 Encounter for preprocedural laboratory examination: Secondary | ICD-10-CM | POA: Insufficient documentation

## 2020-05-05 DIAGNOSIS — Z79890 Hormone replacement therapy (postmenopausal): Secondary | ICD-10-CM | POA: Diagnosis not present

## 2020-05-05 DIAGNOSIS — K640 First degree hemorrhoids: Secondary | ICD-10-CM | POA: Diagnosis not present

## 2020-05-05 DIAGNOSIS — Z86718 Personal history of other venous thrombosis and embolism: Secondary | ICD-10-CM | POA: Diagnosis not present

## 2020-05-05 DIAGNOSIS — F17210 Nicotine dependence, cigarettes, uncomplicated: Secondary | ICD-10-CM | POA: Diagnosis not present

## 2020-05-05 DIAGNOSIS — Z09 Encounter for follow-up examination after completed treatment for conditions other than malignant neoplasm: Secondary | ICD-10-CM | POA: Diagnosis not present

## 2020-05-05 DIAGNOSIS — K5730 Diverticulosis of large intestine without perforation or abscess without bleeding: Secondary | ICD-10-CM | POA: Diagnosis not present

## 2020-05-05 DIAGNOSIS — Z79899 Other long term (current) drug therapy: Secondary | ICD-10-CM | POA: Diagnosis not present

## 2020-05-05 DIAGNOSIS — Z20822 Contact with and (suspected) exposure to covid-19: Secondary | ICD-10-CM | POA: Insufficient documentation

## 2020-05-05 DIAGNOSIS — Z952 Presence of prosthetic heart valve: Secondary | ICD-10-CM | POA: Diagnosis not present

## 2020-05-05 DIAGNOSIS — Z86010 Personal history of colonic polyps: Secondary | ICD-10-CM | POA: Diagnosis not present

## 2020-05-05 LAB — BASIC METABOLIC PANEL
Anion gap: 9 (ref 5–15)
BUN: 13 mg/dL (ref 8–23)
CO2: 27 mmol/L (ref 22–32)
Calcium: 9.6 mg/dL (ref 8.9–10.3)
Chloride: 101 mmol/L (ref 98–111)
Creatinine, Ser: 0.83 mg/dL (ref 0.44–1.00)
GFR, Estimated: >60 mL/min (ref >60)
Glucose, Bld: 114 mg/dL — ABNORMAL HIGH (ref 70–99)
Potassium: 4.3 mmol/L (ref 3.5–5.1)
Sodium: 137 mmol/L (ref 135–145)

## 2020-05-05 LAB — SARS CORONAVIRUS 2 (TAT 6-24 HRS): SARS Coronavirus 2: NEGATIVE

## 2020-05-06 ENCOUNTER — Encounter (HOSPITAL_COMMUNITY)
Admission: RE | Disposition: A | Discharge status: Home / Self Care | Payer: Self-pay | Source: Home / Self Care | Attending: Internal Medicine

## 2020-05-06 ENCOUNTER — Ambulatory Visit (HOSPITAL_COMMUNITY)
Admission: RE | Admit: 2020-05-06 | Discharge: 2020-05-06 | Disposition: A | Discharge status: Home / Self Care | Payer: BC Managed Care – PPO | Attending: Internal Medicine | Admitting: Internal Medicine

## 2020-05-06 ENCOUNTER — Ambulatory Visit (HOSPITAL_COMMUNITY): Payer: BC Managed Care – PPO | Admitting: Anesthesiology

## 2020-05-06 ENCOUNTER — Encounter (HOSPITAL_COMMUNITY): Payer: Self-pay | Admitting: Internal Medicine

## 2020-05-06 ENCOUNTER — Other Ambulatory Visit: Payer: Self-pay

## 2020-05-06 DIAGNOSIS — Z79899 Other long term (current) drug therapy: Secondary | ICD-10-CM | POA: Insufficient documentation

## 2020-05-06 DIAGNOSIS — Z09 Encounter for follow-up examination after completed treatment for conditions other than malignant neoplasm: Secondary | ICD-10-CM | POA: Diagnosis not present

## 2020-05-06 DIAGNOSIS — Z86010 Personal history of colonic polyps: Secondary | ICD-10-CM | POA: Diagnosis not present

## 2020-05-06 DIAGNOSIS — Z01812 Encounter for preprocedural laboratory examination: Secondary | ICD-10-CM | POA: Insufficient documentation

## 2020-05-06 DIAGNOSIS — Z7982 Long term (current) use of aspirin: Secondary | ICD-10-CM | POA: Insufficient documentation

## 2020-05-06 DIAGNOSIS — Z20822 Contact with and (suspected) exposure to covid-19: Secondary | ICD-10-CM | POA: Insufficient documentation

## 2020-05-06 DIAGNOSIS — K5730 Diverticulosis of large intestine without perforation or abscess without bleeding: Secondary | ICD-10-CM | POA: Insufficient documentation

## 2020-05-06 DIAGNOSIS — F17210 Nicotine dependence, cigarettes, uncomplicated: Secondary | ICD-10-CM | POA: Insufficient documentation

## 2020-05-06 DIAGNOSIS — Z952 Presence of prosthetic heart valve: Secondary | ICD-10-CM | POA: Insufficient documentation

## 2020-05-06 DIAGNOSIS — Z86718 Personal history of other venous thrombosis and embolism: Secondary | ICD-10-CM | POA: Insufficient documentation

## 2020-05-06 DIAGNOSIS — Z79890 Hormone replacement therapy (postmenopausal): Secondary | ICD-10-CM | POA: Insufficient documentation

## 2020-05-06 DIAGNOSIS — K640 First degree hemorrhoids: Secondary | ICD-10-CM | POA: Insufficient documentation

## 2020-05-06 HISTORY — PX: COLONOSCOPY WITH PROPOFOL: SHX5780

## 2020-05-06 SURGERY — COLONOSCOPY WITH PROPOFOL
Anesthesia: General

## 2020-05-06 MED ORDER — CHLORHEXIDINE GLUCONATE CLOTH 2 % EX PADS: 6.0000 | MEDICATED_PAD | Freq: Once | CUTANEOUS | Status: DC

## 2020-05-06 MED ORDER — LACTATED RINGERS IV SOLN: Freq: Once | INTRAVENOUS | Status: AC

## 2020-05-06 MED ORDER — PROPOFOL 10 MG/ML IV BOLUS
INTRAVENOUS | Status: DC | PRN
  Administered 2020-05-06: 150 ug/kg/min via INTRAVENOUS
  Administered 2020-05-06: 100 mg via INTRAVENOUS

## 2020-05-06 MED ORDER — LACTATED RINGERS IV SOLN: INTRAVENOUS | Status: DC | PRN

## 2020-05-06 NOTE — Discharge Instructions (Signed)
Colonoscopy Discharge Instructions  Read the instructions outlined below and refer to this sheet in the next few weeks. These discharge instructions provide you with general information on caring for yourself after you leave the hospital. Your doctor may also give you specific instructions. While your treatment has been planned according to the most current medical practices available, unavoidable complications occasionally occur. If you have any problems or questions after discharge, call Dr. Gala Romney at [phone]. ACTIVITY  You may resume your regular activity, but move at a slower pace for the next 24 hours.   Take frequent rest periods for the next 24 hours.   Walking will help get rid of the air and reduce the bloated feeling in your belly (abdomen).   No driving for 24 hours (because of the medicine (anesthesia) used during the test).    Do not sign any important legal documents or operate any machinery for 24 hours (because of the anesthesia used during the test).  NUTRITION  Drink plenty of fluids.   You may resume your normal diet as instructed by your doctor.   Begin with a light meal and progress to your normal diet. Heavy or fried foods are harder to digest and may make you feel sick to your stomach (nauseated).   Avoid alcoholic beverages for 24 hours or as instructed.  MEDICATIONS  You may resume your normal medications unless your doctor tells you otherwise.  WHAT YOU CAN EXPECT TODAY  Some feelings of bloating in the abdomen.   Passage of more gas than usual.   Spotting of blood in your stool or on the toilet paper.  IF YOU HAD POLYPS REMOVED DURING THE COLONOSCOPY:  No aspirin products for 7 days or as instructed.   No alcohol for 7 days or as instructed.   Eat a soft diet for the next 24 hours.  FINDING OUT THE RESULTS OF YOUR TEST Not all test results are available during your visit. If your test results are not back during the visit, make an appointment  with your caregiver to find out the results. Do not assume everything is normal if you have not heard from your caregiver or the medical facility. It is important for you to follow up on all of your test results.  SEEK IMMEDIATE MEDICAL ATTENTION IF:  You have more than a spotting of blood in your stool.   Your belly is swollen (abdominal distention).   You are nauseated or vomiting.   You have a temperature over 101.   You have abdominal pain or discomfort that is severe or gets worse throughout the day.   No polyps found today diverticulosis information provided  Repeat colonoscopy in 5 years recommended  Patient request I called Vernie Ammons at [phone] -reviewed results  PATIENT INSTRUCTIONS POST-ANESTHESIA  IMMEDIATELY FOLLOWING SURGERY:  Do not drive or operate machinery for the first twenty four hours after surgery.  Do not make any important decisions for twenty four hours after surgery or while taking narcotic pain medications or sedatives.  If you develop intractable nausea and vomiting or a severe headache please notify your doctor immediately.  FOLLOW-UP:  Please make an appointment with your surgeon as instructed. You do not need to follow up with anesthesia unless specifically instructed to do so.  WOUND CARE INSTRUCTIONS (if applicable):  Keep a dry clean dressing on the anesthesia/puncture wound site if there is drainage.  Once the wound has quit draining you may leave it open to air.  Generally you  should leave the bandage intact for twenty four hours unless there is drainage.  If the epidural site drains for more than 36-48 hours please call the anesthesia department.  QUESTIONS?:  Please feel free to call your physician or the hospital operator if you have any questions, and they will be happy to assist you.       Diverticulosis  Diverticulosis is a condition that develops when small pouches (diverticula) form in the wall of the large intestine (colon). The  colon is where water is absorbed and stool (feces) is formed. The pouches form when the inside layer of the colon pushes through weak spots in the outer layers of the colon. You may have a few pouches or many of them. The pouches usually do not cause problems unless they become inflamed or infected. When this happens, the condition is called diverticulitis. What are the causes? The cause of this condition is not known. What increases the risk? The following factors may make you more likely to develop this condition:  Being older than age 55. Your risk for this condition increases with age. Diverticulosis is rare among people younger than age 48. By age 64, many people have it.  Eating a low-fiber diet.  Having frequent constipation.  Being overweight.  Not getting enough exercise.  Smoking.  Taking over-the-counter pain medicines, like aspirin and ibuprofen.  Having a family history of diverticulosis. What are the signs or symptoms? In most people, there are no symptoms of this condition. If you do have symptoms, they may include:  Bloating.  Cramps in the abdomen.  Constipation or diarrhea.  Pain in the lower left side of the abdomen. How is this diagnosed? Because diverticulosis usually has no symptoms, it is most often diagnosed during an exam for other colon problems. The condition may be diagnosed by:  Using a flexible scope to examine the colon (colonoscopy).  Taking an X-ray of the colon after dye has been put into the colon (barium enema).  Having a CT scan. How is this treated? You may not need treatment for this condition. Your health care provider may recommend treatment to prevent problems. You may need treatment if you have symptoms or if you previously had diverticulitis. Treatment may include:  Eating a high-fiber diet.  Taking a fiber supplement.  Taking a live bacteria supplement (probiotic).  Taking medicine to relax your colon.   Follow these  instructions at home: Medicines  Take over-the-counter and prescription medicines only as told by your health care provider.  If told by your health care provider, take a fiber supplement or probiotic. Constipation prevention Your condition may cause constipation. To prevent or treat constipation, you may need to:  Drink enough fluid to keep your urine pale yellow.  Take over-the-counter or prescription medicines.  Eat foods that are high in fiber, such as beans, whole grains, and fresh fruits and vegetables.  Limit foods that are high in fat and processed sugars, such as fried or sweet foods.   General instructions  Try not to strain when you have a bowel movement.  Keep all follow-up visits as told by your health care provider. This is important. Contact a health care provider if you:  Have pain in your abdomen.  Have bloating.  Have cramps.  Have not had a bowel movement in 3 days. Get help right away if:  Your pain gets worse.  Your bloating becomes very bad.  You have a fever or chills, and your symptoms suddenly get  worse.  You vomit.  You have bowel movements that are bloody or black.  You have bleeding from your rectum. Summary  Diverticulosis is a condition that develops when small pouches (diverticula) form in the wall of the large intestine (colon).  You may have a few pouches or many of them.  This condition is most often diagnosed during an exam for other colon problems.  Treatment may include increasing the fiber in your diet, taking supplements, or taking medicines. This information is not intended to replace advice given to you by your health care provider. Make sure you discuss any questions you have with your health care provider. Document Revised: 11/07/2018 Document Reviewed: 11/07/2018 Elsevier Patient Education  [place].

## 2020-05-06 NOTE — Transfer of Care (Signed)
Immediate Anesthesia Transfer of Care Note  Patient: Michelle Patton  Procedure(s) Performed: COLONOSCOPY WITH PROPOFOL (N/A )  Patient Location: Endoscopy Unit  Anesthesia Type:General  Level of Consciousness: awake, alert , oriented and patient cooperative  Airway & Oxygen Therapy: Patient Spontanous Breathing  Post-op Assessment: Report given to RN, Post -op Vital signs reviewed and stable and Patient moving all extremities  Post vital signs: Reviewed and stable  Last Vitals:  Vitals Value Taken Time  BP    Temp    Pulse    Resp    SpO2      Last Pain:  Vitals:   05/06/20 0740  TempSrc:   PainSc: 0-No pain      Patients Stated Pain Goal: 7 (55/73/22 0254)  Complications: No complications documented.

## 2020-05-06 NOTE — Op Note (Signed)
[facility] Patient Name: Michelle Patton Procedure Date: 05/06/2020 7:28 AM MRN: [id number] Date of Birth: Feb 25, 1956 Attending MD: Norvel Richards , MD CSN: [id number] Age: 65 Admit Type: Outpatient Procedure:                Colonoscopy Indications:              High risk colon cancer surveillance: Personal                            history of colonic polyps Providers:                Norvel Richards, MD, Gwenlyn Fudge, RN, Rosina Lowenstein, RN Referring MD:              Medicines:                Propofol per Anesthesia Complications:            No immediate complications. Estimated Blood Loss:     Estimated blood loss: none. Procedure:                Pre-Anesthesia Assessment:                           - Prior to the procedure, a History and Physical                            was performed, and patient medications and                            allergies were reviewed. The patient's tolerance of                            previous anesthesia was also reviewed. The risks                            and benefits of the procedure and the sedation                            options and risks were discussed with the patient.                            All questions were answered, and informed consent                            was obtained. Prior Anticoagulants: The patient has                            taken no previous anticoagulant or antiplatelet                            agents. ASA Grade Assessment: II - A patient with  mild systemic disease. After reviewing the risks                            and benefits, the patient was deemed in                            satisfactory condition to undergo the procedure.                           After obtaining informed consent, the colonoscope                            was passed under direct vision. Throughout the                            procedure, the patient's blood  pressure, pulse, and                            oxygen saturations were monitored continuously. The                            [id number]) scope was introduced through                            the anus and advanced to the the cecum, identified                            by appendiceal orifice and ileocecal valve. The                            colonoscopy was performed without difficulty. The                            patient tolerated the procedure well. The quality                            of the bowel preparation was adequate. The                            ileocecal valve, appendiceal orifice, and rectum                            were photographed. Scope In: 7:44:14 AM Scope Out: 7:55:45 AM Scope Withdrawal Time: 0 hours 6 minutes 23 seconds  Total Procedure Duration: 0 hours 11 minutes 31 seconds  Findings:      The perianal and digital rectal examinations were normal.      Non-bleeding internal hemorrhoids were found during retroflexion. The       hemorrhoids were moderate, medium-sized and Grade I (internal       hemorrhoids that do not prolapse). Multiple anal papilla also present.      Scattered medium-mouthed diverticula were found in the sigmoid colon.      The exam was otherwise without abnormality on direct and retroflexion       views. Impression:               -  Non-bleeding internal hemorrhoids. Anal papilla                           - Diverticulosis in the sigmoid colon.                           - The examination was otherwise normal on direct                            and retroflexion views.                           - No specimens collected. Moderate Sedation:      Moderate (conscious) sedation was personally administered by an       anesthesia professional. The following parameters were monitored: oxygen       saturation, heart rate, blood pressure, respiratory rate, EKG, adequacy       of pulmonary ventilation, and response to care. Recommendation:            - Patient has a contact number available for                            emergencies. The signs and symptoms of potential                            delayed complications were discussed with the                            patient. Return to normal activities tomorrow.                            Written discharge instructions were provided to the                            patient.                           - Resume previous diet.                           - Continue present medications.                           - Repeat colonoscopy in 5 years for surveillance.                           - Return to GI office (date not yet determined). Procedure Code(s):        --- Professional ---                           [phone], Colonoscopy, flexible; diagnostic, including                            collection of specimen(s) by brushing or washing,  when performed (separate procedure) Diagnosis Code(s):        --- Professional ---                           Z86.010, Personal history of colonic polyps                           K64.0, First degree hemorrhoids                           K57.30, Diverticulosis of large intestine without                            perforation or abscess without bleeding CPT copyright 2019 American Medical Association. All rights reserved. The codes documented in this report are preliminary and upon coder review may  be revised to meet current compliance requirements. Cristopher Estimable. [name], MD Norvel Richards, MD 05/06/2020 8:06:32 AM This report has been signed electronically. Number of Addenda: 0

## 2020-05-06 NOTE — H&P (Signed)
@LOGO @   Primary Care Physician:  Doree Albee, MD Primary Gastroenterologist:  Dr. Gala Romney  Pre-Procedure History & Physical: HPI:  Michelle Patton is a 65 y.o. female here for surveillance colonoscopy.  History of multiple colonic adenomas removed 2018.  Past Medical History:  Diagnosis Date  . Anxiety   . Aortic regurgitation 07/20/2014  . Aortic stenosis, severe 07/20/2014  . Arthritis   . DVT (deep venous thrombosis) ([place])   . Family history of adverse reaction to anesthesia    Reports father deliurm in his 38's with CABG  . GERD (gastroesophageal reflux disease)   . History of pneumonia   . Hyperlipidemia   . Hypertension   . Insomnia, unspecified   . Muscle weakness (generalized)   . Peripheral artery disease ([place])   . 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 Sleep apnea    diagnosed multiple years ago at [facility]  . Supravalvular aortic stenosis, congenital - s/p repair during childhood     Past Surgical History:  Procedure Laterality Date  . ABDOMINAL AORTAGRAM  06/24/12  . ABDOMINAL AORTAGRAM N/A 06/24/2012   Procedure: ABDOMINAL Maxcine Ham;  Surgeon: Angelia Mould, MD;  Location: [facility] CATH LAB;  Service: Cardiovascular;  Laterality: N/A;  . AORTIC VALVE REPLACEMENT N/A 10/07/2014   Procedure: REDO AORTIC VALVE REPLACEMENT (AVR);  Surgeon: Rexene Alberts, MD;  Location: [place];  Service: Open Heart Surgery;  Laterality: N/A;  . ASCENDING AORTIC ROOT REPLACEMENT N/A 10/07/2014   Procedure: ASCENDING AORTIC ROOT REPLACEMENT;  Surgeon: Rexene Alberts, MD;  Location: [place];  Service: Open Heart Surgery;  Laterality: N/A;  . BIOPSY  09/27/2016   Procedure: BIOPSY;  Surgeon: Daneil Dolin, MD;  [facility];  Service: Endoscopy;;  colon  . BREAST REDUCTION SURGERY Bilateral 01/21/2018    Procedure: BREAST REDUCTION WITH LIPOSUCTION;  Surgeon: Cristine Polio, MD;  Location: [place];  Service: Plastics;  Laterality: Bilateral;  . [place]  . CARPAL TUNNEL RELEASE Right 03/02/2020   Procedure: CARPAL TUNNEL RELEASE;  Surgeon: Carole Civil, MD;  Location: [facility] OR[facility];  Service: Orthopedics;  Laterality: Right;  . CATARACT EXTRACTION W/PHACO Left 05/30/2019   Procedure: CATARACT EXTRACTION PHACO AND INTRAOCULAR LENS PLACEMENT (IOC) (CDE: 4.94  );  Surgeon: Baruch Goldmann, MD;  Location: [facility];  Service: Ophthalmology;  Laterality: Left;  . CATARACT EXTRACTION W/PHACO Right 06/16/2019   Procedure: CATARACT EXTRACTION PHACO AND INTRAOCULAR LENS PLACEMENT (IOC);  Surgeon: Baruch Goldmann, MD;  Location: [facility];  Service: Ophthalmology;  Laterality: Right;  CDE: 4.64  . CERVICAL FUSION    . CHOLECYSTECTOMY    . COLONOSCOPY WITH PROPOFOL N/A 09/27/2016   Dr. Gala Romney: Diverticulosis, several tubular adenomas removed ranging 4 to 7 mm in size, internal grade 1 hemorrhoids, terminal ileum normal, segmental biopsies negative for microscopic colitis.  Next colonoscopy June 2021  . ESOPHAGOGASTRODUODENOSCOPY (EGD) WITH PROPOFOL N/A 09/27/2016   Dr. Gala Romney: Small hiatal hernia, mild Schatzki ring status post disruption, LA grade a esophagitis  . ILIAC ARTERY STENT Left 12/2007  . KNEE ARTHROSCOPY WITH MEDIAL MENISECTOMY Right 01/10/2018   Procedure: RIGHT KNEE ARTHROSCOPY WITH PARTIAL MEDIAL MENISECTOMY;  Surgeon: Carole Civil, MD;  Location: [facility];  Service: Orthopedics;  Laterality: Right;  . LEFT AND RIGHT HEART CATHETERIZATION WITH CORONARY  ANGIOGRAM N/A 07/31/2014   Procedure: LEFT AND RIGHT HEART CATHETERIZATION WITH CORONARY ANGIOGRAM;  Surgeon: Burnell Blanks, MD;  Location: [facility] CATH LAB;  Service: Cardiovascular;  Laterality: N/A;  . Venia Minks DILATION N/A 09/27/2016   Procedure: Venia Minks DILATION;  Surgeon: Daneil Dolin, MD;  [facility];  Service: Endoscopy;  Laterality: N/A;  . POLYPECTOMY  09/27/2016   Procedure: POLYPECTOMY;  Surgeon: Daneil Dolin, MD;  [facility];  Service: Endoscopy;;  colon  . ROTATOR CUFF REPAIR Bilateral   . TEE WITHOUT CARDIOVERSION N/A 07/07/2014   Procedure: TRANSESOPHAGEAL ECHOCARDIOGRAM (TEE);  Surgeon: Arnoldo Lenis, MD;  [facility];  Service: Cardiology;  Laterality: N/A;  . TEE WITHOUT CARDIOVERSION N/A 07/20/2014   Procedure: TRANSESOPHAGEAL ECHOCARDIOGRAM (TEE) WITH PROPOFOL;  Surgeon: Arnoldo Lenis, MD;  Location: [facility];  Service: Endoscopy;  Laterality: N/A;  . TEE WITHOUT CARDIOVERSION N/A 10/07/2014   Procedure: TRANSESOPHAGEAL ECHOCARDIOGRAM (TEE);  Surgeon: Rexene Alberts, MD;  Location: [place];  Service: Open Heart Surgery;  Laterality: N/A;    Prior to Admission medications   Medication Sig Start Date End Date Taking? Authorizing Provider  aspirin EC 81 MG tablet Take 1 tablet (81 mg total) by mouth daily. Swallow whole. 12/16/19  Yes BranchAlphonse Guild, MD  Cholecalciferol (VITAMIN D3) 5000 UNITS TABS Take 10,000 Units by mouth every morning.    Yes [provider]  diclofenac (VOLTAREN) 75 MG EC tablet TAKE 1 TABLET TWICE A DAY (DO NOT CRUSH) Patient taking differently: Take 75 mg by mouth 2 (two) times daily. 02/16/20  Yes Carole Civil, MD  estradiol (ESTRACE) 1 MG tablet Take 1 tablet (1 mg total) by mouth daily. 04/05/20  Yes Gosrani, Nimish C, MD  furosemide (LASIX) 40 MG tablet Take 1 tablet (40 mg total) by mouth daily as needed for edema. Patient taking differently: Take 40 mg by mouth daily as needed for fluid.  10/23/17 02/24/20 Yes Branch, Alphonse Guild, MD  ibuprofen (ADVIL) 600 MG tablet Take 1 tablet (600 mg total) by mouth every 8 (eight) hours as needed for moderate pain. 12/13/19  Yes Bast, Traci A, NP  losartan (COZAAR) 25 MG tablet TAKE 1 TABLET DAILY Patient taking differently: Take 25 mg by mouth daily. 01/14/20  Yes  Branch, Alphonse Guild, MD  melatonin 5 MG TABS Take 5 mg by mouth at bedtime.   Yes [provider]  metoprolol tartrate (LOPRESSOR) 25 MG tablet Take 1 tablet (25 mg total) by mouth 2 (two) times daily. 04/05/20  Yes BranchAlphonse Guild, MD  Multiple Minerals (CALCIUM-MAGNESIUM-ZINC) TABS Take 1 tablet by mouth daily.   Yes [provider]  Multiple Vitamin (MULTIVITAMIN WITH MINERALS) TABS tablet Take 1 tablet by mouth daily.   Yes [provider]  NP THYROID 120 MG tablet Take 1 tablet (120 mg total) by mouth daily before breakfast. 04/05/20  Yes Gosrani, Nimish C, MD  pregabalin (LYRICA) 50 MG capsule Take 1 capsule (50 mg total) by mouth at bedtime. 04/08/20  Yes Carole Civil, MD  progesterone (PROMETRIUM) 200 MG capsule Take 2 capsules (400 mg total) by mouth daily. Patient taking differently: Take 400 mg by mouth at bedtime. 02/17/20  Yes Gosrani, Nimish C, MD  RABEprazole (ACIPHEX) 20 MG tablet Take 1 tablet (20 mg total) by mouth 2 (two) times daily before a meal. 12/17/19  Yes Mahala Menghini, PA-C  rosuvastatin (CRESTOR) 20 MG tablet Take 1 tablet (20 mg  total) by mouth daily. 02/17/20  Yes Gosrani, Nimish C, MD  vitamin B-12 (CYANOCOBALAMIN) 1000 MCG tablet Take 1,000 mcg by mouth daily.   Yes [provider]  doxycycline (VIBRA-TABS) 100 MG tablet Take 1 tablet (100 mg total) by mouth 2 (two) times daily. 03/25/20   Carole Civil, MD    Allergies as of 12/31/2019 - Review Complete 12/25/2019  Allergen Reaction Noted  . Penicillins Hives   . Sulfa antibiotics Nausea And Vomiting 06/19/2012    Family History  Problem Relation Age of Onset  . Hypertension Mother   . Hypertension Father   . Heart disease Father        before age 5  . Other Father        varicose veins  . COPD Father   . Colon cancer Neg Hx   . Celiac disease Neg Hx   . Inflammatory bowel disease Neg Hx     Social History   Socioeconomic History  . Marital  status: Married    Spouse name: Not on file  . Number of children: 0  . Years of education: some coll  . Highest education level: Not on file  Occupational History  . Occupation: call center    Employer: [employer]    Comment: [employer]  Tobacco Use  . Smoking status: Current Every Day Smoker    Packs/day: 0.50    Years: 40.00    Pack years: 20.00    Types: Cigarettes  . Smokeless tobacco: Never Used  Vaping Use  . Vaping Use: Never used  Substance and Sexual Activity  . Alcohol use: Yes    Alcohol/week: 2.0 standard drinks    Types: 2 Cans of beer per week  . Drug use: No  . Sexual activity: Yes    Birth control/protection: None  Other Topics Concern  . Not on file  Social History Narrative   Patient is married   for 5 years . Patient works at [employer].. Some college education-did not Writer.Originally from [place].   Social Determinants of Health   Financial Resource Strain: Not on file  Food Insecurity: Not on file  Transportation Needs: Not on file  Physical Activity: Not on file  Stress: Not on file  Social Connections: Not on file  Intimate Partner Violence: Not on file    Review of Systems: See HPI, otherwise negative ROS  Physical Exam: BP 125/68   Pulse 85   Temp 97.9 F (36.6 C) (Oral)   Resp 18   SpO2 96%  General:   Alert,  Well-developed, well-nourished, pleasant and cooperative in NAD Neck:  Supple; no masses or thyromegaly. No significant cervical adenopathy. Lungs:  Clear throughout to auscultation.   No wheezes, crackles, or rhonchi. No acute distress. Heart:  Regular rate and rhythm; no murmurs, clicks, rubs,  or gallops. Abdomen: Non-distended, normal bowel sounds.  Soft and nontender without appreciable mass or hepatosplenomegaly.  Pulses:  Normal pulses noted. Extremities:  Without clubbing or edema.  Impression/Plan: 65 year old lady with history of multiple colonic adenomas removed previously; here for surveillance  colonoscopy per plan. The risks, benefits, limitations, alternatives and imponderables have been reviewed with the patient. Questions have been answered. All parties are agreeable.      Notice: This dictation was prepared with Dragon dictation along with smaller phrase technology. Any transcriptional errors that result from this process are unintentional and may not be corrected upon review.

## 2020-05-06 NOTE — Anesthesia Postprocedure Evaluation (Signed)
Anesthesia Post Note  Patient: [name]  Procedure(s) Performed: COLONOSCOPY WITH PROPOFOL (N/A )  Patient location during evaluation: Endoscopy Anesthesia Type: General Level of consciousness: awake, oriented, awake and alert and patient cooperative Pain management: pain level controlled Vital Signs Assessment: post-procedure vital signs reviewed and stable Respiratory status: spontaneous breathing, respiratory function stable and nonlabored ventilation Cardiovascular status: blood pressure returned to baseline and stable Postop Assessment: no headache and no backache Anesthetic complications: no   No complications documented.   Last Vitals:  Vitals:   05/06/20 0644  BP: 125/68  Pulse: 85  Resp: 18  Temp: 36.6 C  SpO2: 96%    Last Pain:  Vitals:   05/06/20 0740  TempSrc:   PainSc: 0-No pain                 Tacy Learn

## 2020-05-06 NOTE — Anesthesia Preprocedure Evaluation (Signed)
Anesthesia Evaluation  Patient identified by MRN, date of birth, ID band Patient awake    Reviewed: Allergy & Precautions, NPO status , Patient's Chart, lab work & pertinent test results, reviewed documented beta blocker date and time   History of Anesthesia Complications (+) Family history of anesthesia reaction  Airway Mallampati: II  TM Distance: >3 FB Neck ROM: Full    Dental  (+) Missing, Dental Advisory Given   Pulmonary sleep apnea , Current Smoker and Patient abstained from smoking.,    Pulmonary exam normal breath sounds clear to auscultation       Cardiovascular Exercise Tolerance: Good hypertension, Pt. on medications and Pt. 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 Wall thickness was  normal. Systolic function was vigorous. The estimated ejection  fraction was in the range of 65% to 70%. Wall motion was normal;  there were no regional wall motion abnormalities. Doppler  parameters are consistent with abnormal left ventricular  relaxation (grade 1 diastolic dysfunction). Doppler parameters  are consistent with indeterminate ventricular filling pressure.  - Aortic valve: 21 mm Medtronic Freestyle stentless porcine valve/aortic root graft in place. There was no significant regurgitation. There was no stenosis. Peak gradient (S): 11 mm Hg.  - Mitral valve: Mildly calcified annulus. Normal thickness leaflets  .  - Tricuspid valve: There was mild regurgitation.  - Pulmonary arteries: PA peak pressure: 35 mm Hg (S).    Neuro/Psych Anxiety  Neuromuscular disease    GI/Hepatic Neg liver ROS, GERD  Medicated,  Endo/Other  negative endocrine ROS  Renal/GU negative  Renal ROS     Musculoskeletal  (+) Arthritis ,   Abdominal   Peds  Hematology negative hematology ROS (+)   Anesthesia Other Findings   Reproductive/Obstetrics                             Anesthesia Physical Anesthesia Plan  ASA: III  Anesthesia Plan: General   Post-op Pain Management:    Induction: Intravenous  PONV Risk Score and Plan: TIVA  Airway Management Planned: Nasal Cannula, Natural Airway and Simple Face Mask  Additional Equipment:   Intra-op Plan:   Post-operative Plan:   Informed Consent: I have reviewed the patients History and Physical, chart, labs and discussed the procedure including the risks, benefits and alternatives for the proposed anesthesia with the patient or authorized representative who has indicated his/her understanding and acceptance.     Dental advisory given  Plan Discussed with: CRNA and Surgeon  Anesthesia Plan Comments: (General with local block by Dr. Harrison)        Anesthesia Quick Evaluation  

## 2020-05-13 ENCOUNTER — Encounter (HOSPITAL_COMMUNITY): Payer: Self-pay | Admitting: Internal Medicine

## 2020-05-24 ENCOUNTER — Other Ambulatory Visit (INDEPENDENT_AMBULATORY_CARE_PROVIDER_SITE_OTHER): Payer: Self-pay | Admitting: Internal Medicine

## 2020-06-02 ENCOUNTER — Encounter (INDEPENDENT_AMBULATORY_CARE_PROVIDER_SITE_OTHER): Payer: Self-pay | Admitting: Internal Medicine

## 2020-06-02 ENCOUNTER — Other Ambulatory Visit: Payer: Self-pay

## 2020-06-02 ENCOUNTER — Ambulatory Visit (INDEPENDENT_AMBULATORY_CARE_PROVIDER_SITE_OTHER): Payer: BC Managed Care – PPO | Admitting: Internal Medicine

## 2020-06-02 VITALS — BP 158/80 | HR 75 | Temp 97.3°F | Resp 15 | Ht 66.0 in | Wt 208.0 lb

## 2020-06-02 DIAGNOSIS — I10 Essential (primary) hypertension: Secondary | ICD-10-CM

## 2020-06-02 DIAGNOSIS — E782 Mixed hyperlipidemia: Secondary | ICD-10-CM | POA: Diagnosis not present

## 2020-06-02 DIAGNOSIS — N951 Menopausal and female climacteric states: Secondary | ICD-10-CM | POA: Diagnosis not present

## 2020-06-02 NOTE — Progress Notes (Signed)
Metrics: Intervention Frequency ACO  Documented Smoking Status Yearly  Screened one or more times in 24 months  Cessation Counseling or  Active cessation medication Past 24 months  Past 24 months   Guideline developer: UpToDate (See UpToDate for funding source) Date Released: 2014       Wellness Office Visit  Subjective:  Patient ID: Michelle Patton, female    DOB: 12/11/1955  Age: 65 y.o. MRN: [id number]  CC: This lady comes in for follow-up of hypertension, menopausal symptoms, dyslipidemia and obesity. HPI  She is doing reasonably well and has tolerated higher doses of estradiol and NP thyroid.  We agreed on the last visit to discontinue statin therapy as these were causing side effects for her. She has been not very consistent with nutrition but she realizes that she needs to get back on track. She continues to take estradiol and progesterone and also NP thyroid. She continues on losartan for hypertension as well as metoprolol and takes furosemide as needed for leg edema. Past Medical History:  Diagnosis Date  . Anxiety   . Aortic regurgitation 07/20/2014  . Aortic stenosis, severe 07/20/2014  . Arthritis   . DVT (deep venous thrombosis) ([place])   . Family history of adverse reaction to anesthesia    Reports father deliurm in his 42's with CABG  . GERD (gastroesophageal reflux disease)   . History of pneumonia   . Hyperlipidemia   . Hypertension   . Insomnia, unspecified   . Muscle weakness (generalized)   . Peripheral artery disease ([place])   . 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 Sleep apnea    diagnosed multiple years ago at [facility]  . Supravalvular aortic stenosis, congenital - s/p repair during childhood    Past Surgical History:  Procedure Laterality Date  . ABDOMINAL AORTAGRAM  06/24/12  .  ABDOMINAL AORTAGRAM N/A 06/24/2012   Procedure: ABDOMINAL Maxcine Ham;  Surgeon: Angelia Mould, MD;  Location: [facility] CATH LAB;  Service: Cardiovascular;  Laterality: N/A;  . AORTIC VALVE REPLACEMENT N/A 10/07/2014   Procedure: REDO AORTIC VALVE REPLACEMENT (AVR);  Surgeon: Rexene Alberts, MD;  Location: [place];  Service: Open Heart Surgery;  Laterality: N/A;  . ASCENDING AORTIC ROOT REPLACEMENT N/A 10/07/2014   Procedure: ASCENDING AORTIC ROOT REPLACEMENT;  Surgeon: Rexene Alberts, MD;  Location: [place];  Service: Open Heart Surgery;  Laterality: N/A;  . BIOPSY  09/27/2016   Procedure: BIOPSY;  Surgeon: Daneil Dolin, MD;  [facility];  Service: Endoscopy;;  colon  . BREAST REDUCTION SURGERY Bilateral 01/21/2018   Procedure: BREAST REDUCTION WITH LIPOSUCTION;  Surgeon: Cristine Polio, MD;  Location: [place];  Service: Plastics;  Laterality: Bilateral;  . [place]  . CARPAL TUNNEL RELEASE Right 03/02/2020   Procedure: CARPAL TUNNEL RELEASE;  Surgeon: Carole Civil, MD;  Location: [facility];  Service: Orthopedics;  Laterality: Right;  . CATARACT EXTRACTION W/PHACO Left 05/30/2019   Procedure: CATARACT EXTRACTION PHACO AND INTRAOCULAR LENS PLACEMENT (IOC) (CDE: 4.94  );  Surgeon: Baruch Goldmann, MD;  Location: [facility];  Service: Ophthalmology;  Laterality: Left;  . CATARACT EXTRACTION W/PHACO Right 06/16/2019   Procedure: CATARACT EXTRACTION PHACO AND INTRAOCULAR LENS PLACEMENT (IOC);  Surgeon: Baruch Goldmann, MD;  Location[facility];  Service: Ophthalmology;  Laterality: Right;  CDE:  4.64  . CERVICAL FUSION    . CHOLECYSTECTOMY    . COLONOSCOPY WITH PROPOFOL N/A 09/27/2016   Dr. Gala Romney: Diverticulosis, several tubular adenomas removed ranging 4 to 7 mm in size, internal grade 1 hemorrhoids, terminal ileum normal, segmental biopsies negative for microscopic colitis.  Next colonoscopy June 2021  . COLONOSCOPY WITH PROPOFOL N/A 05/06/2020   Procedure:  COLONOSCOPY WITH PROPOFOL;  Surgeon: Daneil Dolin, MD;  [facility];  Service: Endoscopy;  Laterality: N/A;  7:30am  . ESOPHAGOGASTRODUODENOSCOPY (EGD) WITH PROPOFOL N/A 09/27/2016   Dr. Gala Romney: Small hiatal hernia, mild Schatzki ring status post disruption, LA grade a esophagitis  . ILIAC ARTERY STENT Left 12/2007  . KNEE ARTHROSCOPY WITH MEDIAL MENISECTOMY Right 01/10/2018   Procedure: RIGHT KNEE ARTHROSCOPY WITH PARTIAL MEDIAL MENISECTOMY;  Surgeon: Carole Civil, MD;  [facility];  Service: Orthopedics;  Laterality: Right;  . LEFT AND RIGHT HEART CATHETERIZATION WITH CORONARY ANGIOGRAM N/A 07/31/2014   Procedure: LEFT AND RIGHT HEART CATHETERIZATION WITH CORONARY ANGIOGRAM;  Surgeon: Burnell Blanks, MD;  Location: [facility] CATH LAB;  Service: Cardiovascular;  Laterality: N/A;  . Venia Minks DILATION N/A 09/27/2016   Procedure: Venia Minks DILATION;  Surgeon: Daneil Dolin, MD;  [facility];  Service: Endoscopy;  Laterality: N/A;  . POLYPECTOMY  09/27/2016   Procedure: POLYPECTOMY;  Surgeon: Daneil Dolin, MD;  [facility];  Service: Endoscopy;;  colon  . ROTATOR CUFF REPAIR Bilateral   . TEE WITHOUT CARDIOVERSION N/A 07/07/2014   Procedure: TRANSESOPHAGEAL ECHOCARDIOGRAM (TEE);  Surgeon: Arnoldo Lenis, MD;  [facility];  Service: Cardiology;  Laterality: N/A;  . TEE WITHOUT CARDIOVERSION N/A 07/20/2014   Procedure: TRANSESOPHAGEAL ECHOCARDIOGRAM (TEE) WITH PROPOFOL;  Surgeon: Arnoldo Lenis, MD;  [facility];  Service: Endoscopy;  Laterality: N/A;  . TEE WITHOUT CARDIOVERSION N/A 10/07/2014   Procedure: TRANSESOPHAGEAL ECHOCARDIOGRAM (TEE);  Surgeon: Rexene Alberts, MD;  Location: [place];  Service: Open Heart Surgery;  Laterality: N/A;     Family History  Problem Relation Age of Onset  . Hypertension Mother   . Hypertension Father   . Heart disease Father        before age 90  . Other Father        varicose veins  . COPD  Father   . Colon cancer Neg Hx   . Celiac disease Neg Hx   . Inflammatory bowel disease Neg Hx     Social History   Social History Narrative   Patient is married   for 5 years . Patient works at [employer].. Some college education-did not Writer.Originally from [place].   Social History   Tobacco Use  . Smoking status: Current Every Day Smoker    Packs/day: 0.50    Years: 40.00    Pack years: 20.00    Types: Cigarettes  . Smokeless tobacco: Never Used  Substance Use Topics  . Alcohol use: Yes    Alcohol/week: 2.0 standard drinks    Types: 2 Cans of beer per week    Current Meds  Medication Sig  . aspirin EC 81 MG tablet Take 1 tablet (81 mg total) by mouth daily. Swallow whole.  . Cholecalciferol (VITAMIN D3) 5000 UNITS TABS Take 10,000 Units by mouth every morning.   . diclofenac (VOLTAREN) 75 MG EC tablet TAKE 1 TABLET TWICE A DAY (DO NOT CRUSH) (Patient taking differently: Take 75 mg by mouth 2 (two) times daily.)  .  estradiol (ESTRACE) 1 MG tablet Take 1 tablet (1 mg total) by mouth daily.  . furosemide (LASIX) 40 MG tablet Take 1 tablet (40 mg total) by mouth daily as needed for edema. (Patient taking differently: Take 40 mg by mouth daily as needed for fluid.)  . losartan (COZAAR) 25 MG tablet TAKE 1 TABLET DAILY (Patient taking differently: Take 25 mg by mouth daily.)  . melatonin 5 MG TABS Take 5 mg by mouth at bedtime.  . metoprolol tartrate (LOPRESSOR) 25 MG tablet Take 1 tablet (25 mg total) by mouth 2 (two) times daily.  . Multiple Minerals (CALCIUM-MAGNESIUM-ZINC) TABS Take 1 tablet by mouth daily.  . Multiple Vitamin (MULTIVITAMIN WITH MINERALS) TABS tablet Take 1 tablet by mouth daily.  . NP THYROID 120 MG tablet Take 1 tablet (120 mg total) by mouth daily before breakfast.  . pregabalin (LYRICA) 50 MG capsule Take 1 capsule (50 mg total) by mouth at bedtime.  . progesterone (PROMETRIUM) 200 MG capsule TAKE 1 CAPSULE DAILY (Patient taking  differently: Take 400 mg by mouth daily.)  . RABEprazole (ACIPHEX) 20 MG tablet Take 1 tablet (20 mg total) by mouth 2 (two) times daily before a meal.  . vitamin B-12 (CYANOCOBALAMIN) 1000 MCG tablet Take 1,000 mcg by mouth daily.  . [DISCONTINUED] rosuvastatin (CRESTOR) 20 MG tablet Take 1 tablet (20 mg total) by mouth daily.       Objective:   Today's Vitals: BP (!) 158/80   Pulse 75   Temp (!) 97.3 F (36.3 C) (Temporal)   Resp 15   Ht 5\' 6"  (1.676 m)   Wt 208 lb (94.3 kg)   SpO2 99%   BMI 33.57 kg/m  Vitals with BMI 06/02/2020 05/06/2020 05/06/2020  Height 5\' 6"  - -  Weight 208 lbs - -  BMI 27.74 - -  Systolic 128 786 767  Diastolic 80 62 68  Pulse 75 81 85     Physical Exam  She remains obese and has not lost any significant weight.  Blood pressure today elevated systolically.  She is alert and orientated without any focal logical signs.     Assessment   1. Mixed hyperlipidemia   2. Essential hypertension   3. Hot flashes due to menopause       Tests ordered Orders Placed This Encounter  Procedures  . Estradiol  . Progesterone  . Lipid panel  . T3, free  . TSH     Plan: 1. She will continue with estradiol and progesterone and we will check levels today.  Her menopausal symptoms have resolved and we will try to optimize these levels. 2. Continue with losartan and metoprolol for hypertension as before. 3. Continue with higher dose of desiccated NP thyroid and we will check levels today. 4. Further recommendations will depend on blood results and I will see her in 3 months time for follow-up.   No orders of the defined types were placed in this encounter.   Doree Albee, MD

## 2020-06-03 LAB — TSH: TSH: 0.07 mIU/L — ABNORMAL LOW (ref 0.40–4.50)

## 2020-06-03 LAB — ESTRADIOL: Estradiol: 34 pg/mL

## 2020-06-03 LAB — LIPID PANEL
Cholesterol: 253 mg/dL — ABNORMAL HIGH (ref <200)
HDL: 51 mg/dL (ref >=50)
LDL Cholesterol (Calc): 149 mg/dL (calc) — ABNORMAL HIGH
Non-HDL Cholesterol (Calc): 202 mg/dL (calc) — ABNORMAL HIGH (ref <130)
Total CHOL/HDL Ratio: 5 (calc) — ABNORMAL HIGH (ref <5.0)
Triglycerides: 347 mg/dL — ABNORMAL HIGH (ref <150)

## 2020-06-03 LAB — PROGESTERONE: Progesterone: 25.6 ng/mL

## 2020-06-03 LAB — T3, FREE: T3, Free: 2.9 pg/mL (ref 2.3–4.2)

## 2020-06-04 ENCOUNTER — Other Ambulatory Visit (INDEPENDENT_AMBULATORY_CARE_PROVIDER_SITE_OTHER): Payer: Self-pay | Admitting: Internal Medicine

## 2020-06-04 MED ORDER — NP THYROID 60 MG PO TABS: 60.0000 mg | ORAL_TABLET | Freq: Every day | ORAL | 3 refills | Status: DC

## 2020-06-04 MED ORDER — ESTRADIOL 1 MG PO TABS: 1.5000 mg | ORAL_TABLET | Freq: Every day | ORAL | 3 refills | Status: DC

## 2020-06-14 ENCOUNTER — Other Ambulatory Visit (INDEPENDENT_AMBULATORY_CARE_PROVIDER_SITE_OTHER): Payer: Self-pay | Admitting: Internal Medicine

## 2020-06-14 ENCOUNTER — Encounter (INDEPENDENT_AMBULATORY_CARE_PROVIDER_SITE_OTHER): Payer: Self-pay | Admitting: Internal Medicine

## 2020-06-14 MED ORDER — PROGESTERONE 200 MG PO CAPS: 400.0000 mg | ORAL_CAPSULE | Freq: Every day | ORAL | 1 refills | Status: DC

## 2020-07-01 ENCOUNTER — Encounter (INDEPENDENT_AMBULATORY_CARE_PROVIDER_SITE_OTHER): Payer: Self-pay | Admitting: Internal Medicine

## 2020-07-01 ENCOUNTER — Other Ambulatory Visit (INDEPENDENT_AMBULATORY_CARE_PROVIDER_SITE_OTHER): Payer: Self-pay | Admitting: Internal Medicine

## 2020-07-01 DIAGNOSIS — G4730 Sleep apnea, unspecified: Secondary | ICD-10-CM

## 2020-07-02 ENCOUNTER — Telehealth: Payer: Self-pay

## 2020-07-02 NOTE — Telephone Encounter (Signed)
I would suggest an ER evaluation to sort out right away whats going on. May need to have a CT scan done to make sure no new issues have developed with her prior aortic graft, quickest way to have done is in ER   J Bennet Kujawa MD

## 2020-07-02 NOTE — Telephone Encounter (Signed)
Patient verbalized understanding and said she would go for ER eval.

## 2020-07-02 NOTE — Telephone Encounter (Signed)
Good morning. I'm not feeling well. I have pressure in my chest and up my left side of my neck with no energy and constantly exhausted. It occurred to me last night that this is how I felt before my open heart surgery in 2016. Is that possible? Do I need to see [name] (who I saw last month) or you? Thanks! Michelle Patton   Received message from patient. I contacted her and she admitted that she was having chest pains that come and go. She denies any SOB. Patient does not take nitro. Patient wanted Dr. Nelly Laurence input on what she should do. Patient was instructed to go to ER if her symptoms worsened before hearing back from our office.

## 2020-07-21 ENCOUNTER — Other Ambulatory Visit (INDEPENDENT_AMBULATORY_CARE_PROVIDER_SITE_OTHER): Payer: Self-pay | Admitting: Internal Medicine

## 2020-07-21 ENCOUNTER — Encounter (INDEPENDENT_AMBULATORY_CARE_PROVIDER_SITE_OTHER): Payer: Self-pay | Admitting: Internal Medicine

## 2020-07-21 MED ORDER — NP THYROID 60 MG PO TABS: 60.0000 mg | ORAL_TABLET | Freq: Every day | ORAL | 1 refills | Status: DC

## 2020-07-21 MED ORDER — ESTRADIOL 1 MG PO TABS: 1.5000 mg | ORAL_TABLET | Freq: Every day | ORAL | 1 refills | Status: DC

## 2020-07-21 MED ORDER — NP THYROID 120 MG PO TABS: 120.0000 mg | ORAL_TABLET | Freq: Every day | ORAL | 1 refills | Status: DC

## 2020-07-27 ENCOUNTER — Other Ambulatory Visit (INDEPENDENT_AMBULATORY_CARE_PROVIDER_SITE_OTHER): Payer: Self-pay | Admitting: Internal Medicine

## 2020-07-27 ENCOUNTER — Encounter (INDEPENDENT_AMBULATORY_CARE_PROVIDER_SITE_OTHER): Payer: Self-pay | Admitting: Internal Medicine

## 2020-07-27 MED ORDER — VARENICLINE TARTRATE 1 MG PO TABS: 1.0000 mg | ORAL_TABLET | Freq: Two times a day (BID) | ORAL | 3 refills | Status: DC

## 2020-08-09 ENCOUNTER — Encounter: Payer: Self-pay | Admitting: Neurology

## 2020-08-09 ENCOUNTER — Ambulatory Visit: Payer: BC Managed Care – PPO | Admitting: Neurology

## 2020-08-09 VITALS — BP 133/73 | HR 64 | Ht 66.0 in | Wt 203.0 lb

## 2020-08-09 DIAGNOSIS — J449 Chronic obstructive pulmonary disease, unspecified: Secondary | ICD-10-CM

## 2020-08-09 DIAGNOSIS — G4733 Obstructive sleep apnea (adult) (pediatric): Secondary | ICD-10-CM | POA: Diagnosis not present

## 2020-08-09 DIAGNOSIS — I739 Peripheral vascular disease, unspecified: Secondary | ICD-10-CM | POA: Insufficient documentation

## 2020-08-09 DIAGNOSIS — I5022 Chronic systolic (congestive) heart failure: Secondary | ICD-10-CM

## 2020-08-09 DIAGNOSIS — R0989 Other specified symptoms and signs involving the circulatory and respiratory systems: Secondary | ICD-10-CM

## 2020-08-09 DIAGNOSIS — I350 Nonrheumatic aortic (valve) stenosis: Secondary | ICD-10-CM

## 2020-08-09 DIAGNOSIS — Z952 Presence of prosthetic heart valve: Secondary | ICD-10-CM

## 2020-08-09 DIAGNOSIS — Z954 Presence of other heart-valve replacement: Secondary | ICD-10-CM

## 2020-08-09 DIAGNOSIS — Z6836 Body mass index (BMI) 36.0-36.9, adult: Secondary | ICD-10-CM

## 2020-08-09 NOTE — Patient Instructions (Signed)
Quality Sleep Information, Adult Quality sleep is important for your mental and physical health. It also improves your quality of life. Quality sleep means you:  Are asleep for most of the time you are in bed.  Fall asleep within 30 minutes.  Wake up no more than once a night.  Are awake for no longer than 20 minutes if you do wake up during the night. Most adults need 7-8 hours of quality sleep each night. How can poor sleep affect me? If you do not get enough quality sleep, you may have:  Mood swings.  Daytime sleepiness.  Confusion.  Decreased reaction time.  Sleep disorders, such as insomnia and sleep apnea.  Difficulty with: ? Solving problems. ? Coping with stress. ? Paying attention. These issues may affect your performance and productivity at work, school, and at home. Lack of sleep may also put you at higher risk for accidents, suicide, and risky behaviors. If you do not get quality sleep you may also be at higher risk for several health problems, including:  Infections.  Type 2 diabetes.  Heart disease.  High blood pressure.  Obesity.  Worsening of long-term conditions, like arthritis, kidney disease, depression, Parkinson's disease, and epilepsy. What actions can I take to get more quality sleep?  Stick to a sleep schedule. Go to sleep and wake up at about the same time each day. Do not try to sleep less on weekdays and make up for lost sleep on weekends. This does not work.  Try to get about 30 minutes of exercise on most days. Do not exercise 2-3 hours before going to bed.  Limit naps during the day to 30 minutes or less.  Do not use any products that contain nicotine or tobacco, such as cigarettes or e-cigarettes. If you need help quitting, ask your health care provider.  Do not drink caffeinated beverages for at least 8 hours before going to bed. Coffee, tea, and some sodas contain caffeine.  Do not drink alcohol close to bedtime.  Do not eat  large meals close to bedtime.  Do not take naps in the late afternoon.  Try to get at least 30 minutes of sunlight every day. Morning sunlight is best.  Make time to relax before bed. Reading, listening to music, or taking a hot bath promotes quality sleep.  Make your bedroom a place that promotes quality sleep. Keep your bedroom dark, quiet, and at a comfortable room temperature. Make sure your bed is comfortable. Take out sleep distractions like TV, a computer, smartphone, and bright lights.  If you are lying awake in bed for longer than 20 minutes, get up and do a relaxing activity until you feel sleepy.  Work with your health care provider to treat medical conditions that may affect sleeping, such as: ? Nasal obstruction. ? Snoring. ? Sleep apnea and other sleep disorders.  Talk to your health care provider if you think any of your prescription medicines may cause you to have difficulty falling or staying asleep.  If you have sleep problems, talk with a sleep consultant. If you think you have a sleep disorder, talk with your health care provider about getting evaluated by a specialist.      Where to find more information  National Sleep Foundation website: https://sleepfoundation.org  National Heart, Lung, and Blood Institute (NHLBI): www.nhlbi.nih.gov/files/docs/public/sleep/healthy_sleep.pdf  Centers for Disease Control and Prevention (CDC): www.cdc.gov/sleep/index.html Contact a health care provider if you:  Have trouble getting to sleep or staying asleep.  Often wake   up very early in the morning and cannot get back to sleep.  Have daytime sleepiness.  Have daytime sleep attacks of suddenly falling asleep and sudden muscle weakness (narcolepsy).  Have a tingling sensation in your legs with a strong urge to move your legs (restless legs syndrome).  Stop breathing briefly during sleep (sleep apnea).  Think you have a sleep disorder or are taking a medicine that is  affecting your quality of sleep. Summary  Most adults need 7-8 hours of quality sleep each night.  Getting enough quality sleep is an important part of health and well-being.  Make your bedroom a place that promotes quality sleep and avoid things that may cause you to have poor sleep, such as alcohol, caffeine, smoking, and large meals.  Talk to your health care provider if you have trouble falling asleep or staying asleep. This information is not intended to replace advice given to you by your health care provider. Make sure you discuss any questions you have with your health care provider. Document Revised: 07/18/2017 Document Reviewed: 07/18/2017 Elsevier Patient Education  2021 [place] for Sleep Apnea  Sleep apnea is a condition in which breathing pauses or becomes shallow during sleep. Sleep apnea screening is a test to determine if you are at risk for sleep apnea. The test is easy and only takes a few minutes. Your health care provider may ask you to have this test in preparation for surgery or as part of a physical exam. What are the symptoms of sleep apnea? Common symptoms of sleep apnea include:  Snoring.  Restless sleep.  Daytime sleepiness.  Pauses in breathing.  Choking during sleep.  Irritability.  Forgetfulness.  Trouble thinking clearly.  Depression.  Personality changes. Most people with sleep apnea are not aware that they have it. Why should I get screened? Getting screened for sleep apnea can help:  Ensure your safety. It is important for your health care providers to know whether or not you have sleep apnea, especially if you are having surgery or have other long-term (chronic) health conditions.  Improve your health and allow you to get a better night's rest. Restful sleep can help you: ? Have more energy. ? Lose weight. ? Improve high blood pressure. ? Improve diabetes management. ? Prevent stroke. ? Prevent car accidents. How  is screening done? Screening usually includes being asked a list of questions about your sleep quality. Some questions you may be asked include:  Do you snore?  Is your sleep restless?  Do you have daytime sleepiness?  Has a partner or spouse told you that you stop breathing during sleep?  Have you had trouble concentrating or memory loss? If your screening test is positive, you are at risk for the condition. Further testing may be needed to confirm a diagnosis of sleep apnea. Where to find more information You can find screening tools online or at your health care clinic. For more information about sleep apnea screening and healthy sleep, visit these websites:  Centers for Disease Control and Prevention: [employer]pl  American Sleep Apnea Association: www.sleepapnea.org Contact a health care provider if:  You think that you may have sleep apnea. Summary  Sleep apnea screening can help determine if you are at risk for sleep apnea.  It is important for your health care providers to know whether or not you have sleep apnea, especially if you are having surgery or have other chronic health conditions.  You may be asked to take a screening test  for sleep apnea in preparation for surgery or as part of a physical exam. This information is not intended to replace advice given to you by your health care provider. Make sure you discuss any questions you have with your health care provider. Document Revised: 01/25/2018 Document Reviewed: 07/21/2016 Elsevier Patient Education  [place].

## 2020-08-09 NOTE — Progress Notes (Signed)
SLEEP MEDICINE CLINIC    Provider:  Larey Seat, MD  Primary Care Physician:  Doree Albee, MD [place] [address]     Referring Provider: Doree Albee, Md [address] [place],  [place] [address]          Chief Complaint according to patient   Patient presents with:    . New Patient (Initial Visit)           HISTORY OF PRESENT ILLNESS:  Michelle Patton is a 65 y.o. Caucasian female patient is seen here upon referral of Dr [name], on 08/09/2020 . Her primary neurologist is Dr. Krista Blue , for leg pain.  Chief concern according to patient :   " I had a sleep study in [place] at least one decade ago, was treated with CPAP, but not using it any longer- I could not use it" - " no doctor followed up on it ". Dr. [name] wants me to look at this again as I snore, I gained weight since, and I have difficulties to lose weight. Paternal history of early heart disease, paternal GM, father and Barbaraann Rondo, aunts. On her maternal side stroke in [place]. Mother has CHF. Dr Harl Bowie diagnosed CHF.     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     Sleep relevant medical history: She used to have leg cramps, foot cramping in the middle of the night- " my paternal family has it ". magnesium in PM has treated this condition., hydration helps. EMG and NCV were normal,  2) Dr Doren Custard- she has a history of femoral artery stenosis, claudication, now stented .  Dr Harl Bowie diagnosed  CHF. Born with aortic stenosis many surgeries since age 31, 3 times CABG, Nocturia 1-2, spinal fusion anterior C4-5.  Family medical /sleep history:Paternal history of early heart disease, paternal GM, father and Barbaraann Rondo, aunts. On her maternal side stroke in [place]. Mother has CHF. Other family member on CPAP with OSA, insomnia, sleep walkers.    Social history:  Patient is working as [employer], and lives in a household with persons/ alone.  Family status is married , without children.  The patient currently works from home. Pets are present, 3 dogs and one cat. . Tobacco use- she is a smoker, active. 1 ppd(!).   ETOH use ; socially., 2-4 drinks/ a week   Caffeine intake in form of Coffee( 1/2 pot of coffee in AM - 4 cups or more. ) Soda( /) Tea ( /) no energy drinks. Regular exercise in form of - walking when she can go out with a girl friend.   Hobbies : bird watching.      Sleep habits are as follows: The patient's dinner time is between 5 PM, but often skipps dinner- intermittent fasting.   The patient goes to bed at 8-9 PM and continues to sleep for 3 hours, wakes for 1-2 bathroom breaks.   The preferred sleep position is prone , with the support of 1 pillow.  Dreams are reportedly rare. 5 AM is the usual rise time. Starts working at 7.  The patient wakes up spontaneously.  She reports not feeling refreshed or restored in AM,  Naps are taken infrequently, lasting from 30-6- minutes and are less refreshing than nocturnal sleep.    Review of Systems: Out of a complete 14 system review, the patient complains of only the following symptoms, and all other reviewed systems are negative.:  Fatigue, sleepiness , snoring- witnessed by husband- who is not here ,  fatigue , not sleepy. GDS 4/ 15 points, [place].    How likely are you to doze in the following situations: 0 = not likely, 1 = slight chance, 2 = moderate chance, 3 = high chance   Sitting and  Reading? Watching Television? Sitting inactive in a public place (theater or meeting)? As a passenger in a car for an hour without a break? Lying down in the afternoon when circumstances permit? Sitting and talking to someone? Sitting quietly after lunch without alcohol? In a car, while stopped for a few minutes in traffic?   Total =2 / 24 points   FSS endorsed at 39/ 63 points.   " feeing exhausted - and I don't know why"  Social History   Socioeconomic History  . Marital status: Married    Spouse name: Not on file  . Number of children: 0  . Years of education: some coll  . Highest education level: Not on file  Occupational History  . Occupation: call center    Employer: [employer]    Comment: [employer]  Tobacco Use  . Smoking status: Current Every Day Smoker    Packs/day: 0.50    Years: 40.00    Pack years: 20.00    Types: Cigarettes  . Smokeless tobacco: Never Used  Vaping Use  . Vaping Use: Never used  Substance and Sexual Activity  . Alcohol use: Yes    Alcohol/week: 2.0 standard drinks    Types: 2 Cans of beer per week  . Drug use: No  . Sexual activity: Yes    Birth control/protection: None  Other Topics Concern  . Not on file  Social History Narrative   Patient is married   for 5 years . Patient works at [employer].. Some college education-did not Writer.Originally from [place]. [place]. Lived in [place], [place], in [place].    Social Determinants of Health   Financial Resource Strain: Not on file  Food Insecurity: Not on file  Transportation Needs: Not on file  Physical Activity: Not on file  Stress: Not on file  Social Connections: Not on file    Family History  Problem Relation Age of Onset  . Hypertension Mother   . Hypertension Father   . Heart disease Father        before age 59  . Other Father        varicose veins  . COPD Father   . Colon cancer Neg Hx   . Celiac disease Neg Hx   . Inflammatory bowel disease Neg Hx      Past Medical History:  Diagnosis Date  . Anxiety   . Aortic regurgitation 07/20/2014  . Aortic stenosis, severe 07/20/2014  . Arthritis   . DVT (deep venous thrombosis) ([place])   . Family history of adverse reaction to anesthesia    Reports father deliurm in his 60's with CABG  . GERD (gastroesophageal reflux disease)   . History of pneumonia   . Hyperlipidemia   . Hypertension   . Insomnia, unspecified   .  Muscle weakness (generalized)   . Peripheral artery disease ([place])   . 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 Sleep apnea    diagnosed multiple years ago at [facility]  . Supravalvular aortic stenosis, congenital - s/p repair during childhood     Past Surgical History:  Procedure Laterality Date  . ABDOMINAL AORTAGRAM  06/24/12  . ABDOMINAL AORTAGRAM N/A 06/24/2012   Procedure: ABDOMINAL Maxcine Ham;  Surgeon: Angelia Mould, MD;  Location: [facility] CATH LAB;  Service: Cardiovascular;  Laterality: N/A;  . AORTIC VALVE REPLACEMENT N/A 10/07/2014   Procedure: REDO AORTIC VALVE REPLACEMENT (AVR);  Surgeon: Rexene Alberts, MD;  Location: [place];  Service: Open Heart Surgery;  Laterality: N/A;  . ASCENDING AORTIC ROOT REPLACEMENT N/A 10/07/2014   Procedure: ASCENDING AORTIC ROOT REPLACEMENT;  Surgeon: Rexene Alberts, MD;  Location: [place];  Service: Open Heart Surgery;  Laterality: N/A;  . BIOPSY  09/27/2016   Procedure: BIOPSY;  Surgeon: Daneil Dolin, MD;  [facility];  Service: Endoscopy;;  colon  . BREAST REDUCTION SURGERY Bilateral 01/21/2018   Procedure: BREAST REDUCTION WITH LIPOSUCTION;  Surgeon: Cristine Polio, MD;  Location: [place];  Service: Plastics;  Laterality: Bilateral;  . [place]  . CARPAL TUNNEL RELEASE Right 03/02/2020   Procedure: CARPAL  TUNNEL RELEASE;  Surgeon: Carole Civil, MD;  Location: [facility] OR[facility];  Service: Orthopedics;  Laterality: Right;  . CATARACT EXTRACTION W/PHACO Left 05/30/2019   Procedure: CATARACT EXTRACTION PHACO AND INTRAOCULAR LENS PLACEMENT (IOC) (CDE: 4.94  );  Surgeon: Baruch Goldmann, MD;  Location: [facility];  Service: Ophthalmology;  Laterality: Left;  . CATARACT EXTRACTION W/PHACO Right 06/16/2019   Procedure: CATARACT EXTRACTION PHACO AND INTRAOCULAR LENS PLACEMENT (IOC);  Surgeon: Baruch Goldmann, MD;  Location: [facility];  Service: Ophthalmology;  Laterality: Right;  CDE: 4.64  . CERVICAL FUSION    . CHOLECYSTECTOMY    . COLONOSCOPY WITH PROPOFOL N/A 09/27/2016   Dr. Gala Romney: Diverticulosis, several tubular adenomas removed ranging 4 to 7 mm in size, internal grade 1 hemorrhoids, terminal ileum normal, segmental biopsies negative for microscopic colitis.  Next colonoscopy June 2021  . COLONOSCOPY WITH PROPOFOL N/A 05/06/2020   Procedure: COLONOSCOPY WITH PROPOFOL;  Surgeon: Daneil Dolin, MD;  [facility];  Service: Endoscopy;  Laterality: N/A;  7:30am  . ESOPHAGOGASTRODUODENOSCOPY (EGD) WITH PROPOFOL N/A 09/27/2016   Dr. Gala Romney: Small hiatal hernia, mild Schatzki ring status post disruption, LA grade a esophagitis  . ILIAC ARTERY STENT Left 12/2007  . KNEE ARTHROSCOPY WITH MEDIAL MENISECTOMY Right 01/10/2018   Procedure: RIGHT KNEE ARTHROSCOPY WITH PARTIAL MEDIAL MENISECTOMY;  Surgeon: Carole Civil, MD;  [facility];  Service: Orthopedics;  Laterality: Right;  . LEFT AND RIGHT HEART CATHETERIZATION WITH CORONARY ANGIOGRAM N/A 07/31/2014   Procedure: LEFT AND RIGHT HEART CATHETERIZATION WITH CORONARY ANGIOGRAM;  Surgeon: Burnell Blanks, MD;  Location: [facility] CATH LAB;  Service: Cardiovascular;  Laterality: N/A;  . Venia Minks DILATION N/A 09/27/2016   Procedure: Venia Minks DILATION;  Surgeon: Daneil Dolin, MD;  [facility];  Service: Endoscopy;  Laterality: N/A;  . POLYPECTOMY   09/27/2016   Procedure: POLYPECTOMY;  Surgeon: Daneil Dolin, MD;  [facility];  Service: Endoscopy;;  colon  . ROTATOR CUFF REPAIR Bilateral   .  TEE WITHOUT CARDIOVERSION N/A 07/07/2014   Procedure: TRANSESOPHAGEAL ECHOCARDIOGRAM (TEE);  Surgeon: Arnoldo Lenis, MD;  [facility];  Service: Cardiology;  Laterality: N/A;  . TEE WITHOUT CARDIOVERSION N/A 07/20/2014   Procedure: TRANSESOPHAGEAL ECHOCARDIOGRAM (TEE) WITH PROPOFOL;  Surgeon: Arnoldo Lenis, MD;  [facility];  Service: Endoscopy;  Laterality: N/A;  . TEE WITHOUT CARDIOVERSION N/A 10/07/2014   Procedure: TRANSESOPHAGEAL ECHOCARDIOGRAM (TEE);  Surgeon: Rexene Alberts, MD;  Location: [place];  Service: Open Heart Surgery;  Laterality: N/A;     Current Outpatient Medications on File Prior to Visit  Medication Sig Dispense Refill  . aspirin EC 81 MG tablet Take 1 tablet (81 mg total) by mouth daily. Swallow whole. 90 tablet 3  . Cholecalciferol (VITAMIN D3) 5000 UNITS TABS Take 10,000 Units by mouth every morning.     . diclofenac (VOLTAREN) 75 MG EC tablet TAKE 1 TABLET TWICE A DAY (DO NOT CRUSH) (Patient taking differently: Take 75 mg by mouth 2 (two) times daily.) 180 tablet 3  . estradiol (ESTRACE) 1 MG tablet Take 1.5 tablets (1.5 mg total) by mouth daily. 135 tablet 1  . losartan (COZAAR) 25 MG tablet TAKE 1 TABLET DAILY (Patient taking differently: Take 25 mg by mouth daily.) 90 tablet 3  . melatonin 5 MG TABS Take 5 mg by mouth at bedtime.    . metoprolol tartrate (LOPRESSOR) 25 MG tablet Take 1 tablet (25 mg total) by mouth 2 (two) times daily. 180 tablet 3  . Multiple Minerals (CALCIUM-MAGNESIUM-ZINC) TABS Take 1 tablet by mouth daily.    . Multiple Vitamin (MULTIVITAMIN WITH MINERALS) TABS tablet Take 1 tablet by mouth daily.    . NP THYROID 120 MG tablet Take 1 tablet (120 mg total) by mouth daily before breakfast. 90 tablet 1  . NP THYROID 60 MG tablet Take 1 tablet (60 mg total) by mouth daily  before breakfast. 90 tablet 1  . pregabalin (LYRICA) 50 MG capsule Take 1 capsule (50 mg total) by mouth at bedtime. 30 capsule 2  . progesterone (PROMETRIUM) 200 MG capsule Take 2 capsules (400 mg total) by mouth daily. 180 capsule 1  . RABEprazole (ACIPHEX) 20 MG tablet Take 1 tablet (20 mg total) by mouth 2 (two) times daily before a meal. 180 tablet 3  . varenicline (CHANTIX) 1 MG tablet Take 1 tablet (1 mg total) by mouth 2 (two) times daily. 60 tablet 3  . vitamin B-12 (CYANOCOBALAMIN) 1000 MCG tablet Take 1,000 mcg by mouth daily.    . furosemide (LASIX) 40 MG tablet Take 1 tablet (40 mg total) by mouth daily as needed for edema. (Patient taking differently: Take 40 mg by mouth daily as needed for fluid.) 90 tablet 3   No current facility-administered medications on file prior to visit.    Allergies  Allergen Reactions  . Penicillins Hives    Has patient had a PCN reaction causing immediate rash, facial/tongue/throat swelling, SOB or lightheadedness with hypotension: Yes Has patient had a PCN reaction causing severe rash involving mucus membranes or skin necrosis: No Has patient had a PCN reaction that required hospitalization No Has patient had a PCN reaction occurring within the last 10 years: No If all of the above answers are "NO", then may proceed with Cephalosporin use.  Have taken Keflex before without any adverse reaction.   . Sulfa Antibiotics Nausea And Vomiting    Physical exam:  Today's Vitals   08/09/20 0845  BP: 133/73  Pulse: 64  Weight: 203 lb (92.1 kg)  Height: 5\' 6"  (1.676 m)   Body mass index is 32.77 kg/m.   Wt Readings from Last 3 Encounters:  08/09/20 203 lb (92.1 kg)  06/02/20 208 lb (94.3 kg)  04/01/20 207 lb (93.9 kg)     Ht Readings from Last 3 Encounters:  08/09/20 5\' 6"  (1.676 m)  06/02/20 5\' 6"  (1.676 m)  04/01/20 5\' 6"  (1.676 m)      General: The patient is awake, alert and appears not in acute distress.  The patient is well  groomed. Head: Normocephalic, atraumatic.  Neck is supple. Mallampati: 1,  neck circumference: 15.5  inches . Nasal airflow congested.  Retrognathia is seen.  Dental status: biological - never wore braces.  Cardiovascular:  Regular rate and cardiac rhythm by pulse,  without distended neck veins. Respiratory: Lungs are clear to auscultation.  Skin:  Without evidence of ankle edema, or rash. Trunk: The patient is obese. Large abdominal girth.     Neurologic exam : The patient is awake and alert, oriented to place and time.   Memory subjective described as intact.  Attention span & concentration ability appears normal.  Speech is fluent,  without  dysarthria, dysphonia or aphasia.  Mood and affect are appropriate.   Cranial nerves: no loss of smell or taste reported  Pupils are equal and briskly reactive to light. Funduscopic exam - status post cataract surgery..  Extraocular movements in vertical and horizontal planes were intact and without nystagmus. No Diplopia. Visual fields by finger perimetry are intact. Hearing was intact to soft voice and finger rubbing.  Facial sensation intact to fine touch. Facial motor strength is symmetric and tongue and uvula move midline.  Neck ROM : rotation, tilt and flexion extension were normal for age and shoulder shrug was symmetrical.    Motor exam:  Symmetric bulk, tone and ROM.   Normal tone without cog wheeling, symmetric grip strength .   Sensory:  Fine touch and vibration were normal in upper extremitatis and reduced below ankle in both feet. .  Proprioception tested in the upper extremities was normal.   Coordination: Rapid alternating movements in the fingers/hands were of normal speed.     Gait and station: Patient could rise unassisted from a seated position, walked without assistive device.  Stance is of normal width/ base. Toe and heel walk were deferred.  Deep tendon reflexes: in the  upper and lower extremities are symmetric and  intact.  Babinski response was deferred .    After spending a total time of 55 minutes in CONSULTATION ,  face to face including additional time for physical and neurologic examination, review of laboratory studies,  personal review of imaging studies, reports and results of other testing and review of referral information / records as far as provided in visit, I have established the following assessments:  1) snoring and known dx of sleep apnea untreated.   2)She is at risk for central apnea ( CHF, CAD) for hypoxia ( PAD-smoker's leg), and she is a smoker -  3) still, COPD can be part overlapping, causing hypoxia.  4) excessive fatigue, not sleepy.     My Plan is to proceed with:  1)  Attended sleep study - will look at PLMs and hypoxia , CSA versus OSA  2)  If not allowed by insurance-  Will need HST.   I would like to thank Doree Albee, Md [address] [place],  [place] [address] for allowing  me to meet with and to take care of this pleasant patient.   In short, Lorel Lembo is presenting with non restorative sleep , a symptom that can be attributed to the diagnoses above.   I plan to follow up either personally or through our NP within 2-4 month.   CC: I will share my notes with Dr Krista Blue and Dr [name], Dr. Harl Bowie.   Electronically signed by: Larey Seat, MD 08/09/2020 8:58 AM  [facility] Board certified by The [employer] of Sleep Medicine and Diplomate of the [employer] of Sleep Medicine. Board certified In Neurology through the [place], Fellow of the [employer] of Neurology. Medical Director of [facility].

## 2020-09-01 ENCOUNTER — Encounter (INDEPENDENT_AMBULATORY_CARE_PROVIDER_SITE_OTHER): Payer: Self-pay | Admitting: Internal Medicine

## 2020-09-01 ENCOUNTER — Other Ambulatory Visit: Payer: Self-pay

## 2020-09-01 ENCOUNTER — Ambulatory Visit (INDEPENDENT_AMBULATORY_CARE_PROVIDER_SITE_OTHER): Payer: BC Managed Care – PPO | Admitting: Internal Medicine

## 2020-09-01 VITALS — BP 126/76 | HR 74 | Temp 97.5°F | Ht 66.0 in | Wt 207.4 lb

## 2020-09-01 DIAGNOSIS — E669 Obesity, unspecified: Secondary | ICD-10-CM

## 2020-09-01 DIAGNOSIS — I10 Essential (primary) hypertension: Secondary | ICD-10-CM | POA: Diagnosis not present

## 2020-09-01 DIAGNOSIS — N951 Menopausal and female climacteric states: Secondary | ICD-10-CM | POA: Diagnosis not present

## 2020-09-01 DIAGNOSIS — E782 Mixed hyperlipidemia: Secondary | ICD-10-CM | POA: Diagnosis not present

## 2020-09-01 MED ORDER — PREDNISONE 20 MG PO TABS: 40.0000 mg | ORAL_TABLET | Freq: Every day | ORAL | 1 refills | Status: DC

## 2020-09-01 NOTE — Progress Notes (Signed)
Metrics: Intervention Frequency ACO  Documented Smoking Status Yearly  Screened one or more times in 24 months  Cessation Counseling or  Active cessation medication Past 24 months  Past 24 months   Guideline developer: UpToDate (See UpToDate for funding source) Date Released: 2014       Wellness Office Visit  Subjective:  Patient ID: Michelle Patton, female    DOB: 09-02-1955  Age: 65 y.o. MRN: [id number]  CC: This lady comes in for follow-up regarding hypertension, obesity, dyslipidemia, menopausal symptoms. HPI  She has tolerated the higher dose of estradiol 1.5 mg daily and also the higher dose of NP thyroid 120 mg in the morning and NP thyroid 60 mg in the afternoon. She continues on progesterone 400 mg at night. She is currently trying to stop smoking and has been taking Chantix. Unfortunately she did not remember to take her Aciphex for about a week and is now getting reflux symptoms which are having a hard time improving. She also has a skin rash on her right medial arm around the elbow area.  It looks like an allergic reaction to something. Past Medical History:  Diagnosis Date  . Anxiety   . Aortic regurgitation 07/20/2014  . Aortic stenosis, severe 07/20/2014  . Arthritis   . DVT (deep venous thrombosis) ([place])   . Family history of adverse reaction to anesthesia    Reports father deliurm in his 60's with CABG  . GERD (gastroesophageal reflux disease)   . History of pneumonia   . Hyperlipidemia   . Hypertension   . Insomnia, unspecified   . Muscle weakness (generalized)   . Peripheral artery disease ([place])   . 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 Sleep apnea    diagnosed multiple years ago at [facility]  . Supravalvular aortic stenosis, congenital - s/p repair during childhood    Past Surgical  History:  Procedure Laterality Date  . ABDOMINAL AORTAGRAM  06/24/12  . ABDOMINAL AORTAGRAM N/A 06/24/2012   Procedure: ABDOMINAL Maxcine Ham;  Surgeon: Angelia Mould, MD;  Location[facility] CATH LAB;  Service: Cardiovascular;  Laterality: N/A;  . AORTIC VALVE REPLACEMENT N/A 10/07/2014   Procedure: REDO AORTIC VALVE REPLACEMENT (AVR);  Surgeon: Rexene Alberts, MD;  Location: [place];  Service: Open Heart Surgery;  Laterality: N/A;  . ASCENDING AORTIC ROOT REPLACEMENT N/A 10/07/2014   Procedure: ASCENDING AORTIC ROOT REPLACEMENT;  Surgeon: Rexene Alberts, MD;  Location: [place];  Service: Open Heart Surgery;  Laterality: N/A;  . BIOPSY  09/27/2016   Procedure: BIOPSY;  Surgeon: Daneil Dolin, MD;  [facility];  Service: Endoscopy;;  colon  . BREAST REDUCTION SURGERY Bilateral 01/21/2018   Procedure: BREAST REDUCTION WITH LIPOSUCTION;  Surgeon: Cristine Polio, MD;  Location: [place];  Service: Plastics;  Laterality: Bilateral;  . [place]  . CARPAL TUNNEL RELEASE Right 03/02/2020   Procedure: CARPAL TUNNEL RELEASE;  Surgeon: Carole Civil, MD;  Location: [facility];  Service: Orthopedics;  Laterality: Right;  . CATARACT EXTRACTION W/PHACO Left 05/30/2019   Procedure: CATARACT EXTRACTION PHACO AND INTRAOCULAR LENS PLACEMENT (IOC) (CDE: 4.94  );  Surgeon: Baruch Goldmann, MD;  Location: [facility];  Service: Ophthalmology;  Laterality: Left;  . CATARACT EXTRACTION W/PHACO Right 06/16/2019   Procedure: CATARACT EXTRACTION PHACO AND INTRAOCULAR LENS  PLACEMENT (IOC);  Surgeon: Baruch Goldmann, MD;  [facility];  Service: Ophthalmology;  Laterality: Right;  CDE: 4.64  . CERVICAL FUSION    . CHOLECYSTECTOMY    . COLONOSCOPY WITH PROPOFOL N/A 09/27/2016   Dr. Gala Romney: Diverticulosis, several tubular adenomas removed ranging 4 to 7 mm in size, internal grade 1 hemorrhoids, terminal ileum normal, segmental biopsies negative for microscopic colitis.  Next colonoscopy  June 2021  . COLONOSCOPY WITH PROPOFOL N/A 05/06/2020   Procedure: COLONOSCOPY WITH PROPOFOL;  Surgeon: Daneil Dolin, MD;  [facility];  Service: Endoscopy;  Laterality: N/A;  7:30am  . ESOPHAGOGASTRODUODENOSCOPY (EGD) WITH PROPOFOL N/A 09/27/2016   Dr. Gala Romney: Small hiatal hernia, mild Schatzki ring status post disruption, LA grade a esophagitis  . ILIAC ARTERY STENT Left 12/2007  . KNEE ARTHROSCOPY WITH MEDIAL MENISECTOMY Right 01/10/2018   Procedure: RIGHT KNEE ARTHROSCOPY WITH PARTIAL MEDIAL MENISECTOMY;  Surgeon: Carole Civil, MD;  [facility];  Service: Orthopedics;  Laterality: Right;  . LEFT AND RIGHT HEART CATHETERIZATION WITH CORONARY ANGIOGRAM N/A 07/31/2014   Procedure: LEFT AND RIGHT HEART CATHETERIZATION WITH CORONARY ANGIOGRAM;  Surgeon: Burnell Blanks, MD;  Location: [facility] CATH LAB;  Service: Cardiovascular;  Laterality: N/A;  . Venia Minks DILATION N/A 09/27/2016   Procedure: Venia Minks DILATION;  Surgeon: Daneil Dolin, MD;  [facility];  Service: Endoscopy;  Laterality: N/A;  . POLYPECTOMY  09/27/2016   Procedure: POLYPECTOMY;  Surgeon: Daneil Dolin, MD;  [facility];  Service: Endoscopy;;  colon  . ROTATOR CUFF REPAIR Bilateral   . TEE WITHOUT CARDIOVERSION N/A 07/07/2014   Procedure: TRANSESOPHAGEAL ECHOCARDIOGRAM (TEE);  Surgeon: Arnoldo Lenis, MD;  [facility];  Service: Cardiology;  Laterality: N/A;  . TEE WITHOUT CARDIOVERSION N/A 07/20/2014   Procedure: TRANSESOPHAGEAL ECHOCARDIOGRAM (TEE) WITH PROPOFOL;  Surgeon: Arnoldo Lenis, MD;  [facility];  Service: Endoscopy;  Laterality: N/A;  . TEE WITHOUT CARDIOVERSION N/A 10/07/2014   Procedure: TRANSESOPHAGEAL ECHOCARDIOGRAM (TEE);  Surgeon: Rexene Alberts, MD;  Location: [place];  Service: Open Heart Surgery;  Laterality: N/A;     Family History  Problem Relation Age of Onset  . Hypertension Mother   . Hypertension Father   . Heart disease Father         before age 41  . Other Father        varicose veins  . COPD Father   . Colon cancer Neg Hx   . Celiac disease Neg Hx   . Inflammatory bowel disease Neg Hx     Social History   Social History Narrative   Patient is married   for 5 years . Patient works at [employer].. 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   Social History   Tobacco Use  . Smoking status: Current Every Day Smoker    Packs/day: 0.50    Years: 40.00    Pack years: 20.00    Types: Cigarettes  . Smokeless tobacco: Never Used  Substance Use Topics  . Alcohol use: Yes    Alcohol/week: 2.0 standard drinks    Types: 2 Cans of beer per week    Current Meds  Medication Sig  . aspirin EC 81 MG tablet Take 1 tablet (81 mg total) by mouth daily. Swallow whole.  . Cholecalciferol (VITAMIN D3) 5000 UNITS TABS Take 10,000 Units by mouth every morning.   . diclofenac (VOLTAREN) 75 MG EC tablet TAKE 1 TABLET TWICE  A DAY (DO NOT CRUSH) (Patient taking differently: Take 75 mg by mouth 2 (two) times daily.)  . estradiol (ESTRACE) 1 MG tablet Take 1.5 tablets (1.5 mg total) by mouth daily.  . furosemide (LASIX) 40 MG tablet Take 1 tablet (40 mg total) by mouth daily as needed for edema. (Patient taking differently: Take 40 mg by mouth daily as needed for fluid.)  . losartan (COZAAR) 25 MG tablet TAKE 1 TABLET DAILY (Patient taking differently: Take 25 mg by mouth daily.)  . melatonin 5 MG TABS Take 5 mg by mouth at bedtime.  . metoprolol tartrate (LOPRESSOR) 25 MG tablet Take 1 tablet (25 mg total) by mouth 2 (two) times daily.  . Multiple Minerals (CALCIUM-MAGNESIUM-ZINC) TABS Take 1 tablet by mouth daily.  . Multiple Vitamin (MULTIVITAMIN WITH MINERALS) TABS tablet Take 1 tablet by mouth daily.  . NP THYROID 120 MG tablet Take 1 tablet (120 mg total) by mouth daily before breakfast.  . NP THYROID 60 MG tablet Take 1 tablet (60 mg total) by mouth daily before breakfast.  .  predniSONE (DELTASONE) 20 MG tablet Take 2 tablets (40 mg total) by mouth daily with breakfast.  . pregabalin (LYRICA) 50 MG capsule Take 1 capsule (50 mg total) by mouth at bedtime.  . progesterone (PROMETRIUM) 200 MG capsule Take 2 capsules (400 mg total) by mouth daily.  . RABEprazole (ACIPHEX) 20 MG tablet Take 1 tablet (20 mg total) by mouth 2 (two) times daily before a meal.  . varenicline (CHANTIX) 1 MG tablet Take 1 tablet (1 mg total) by mouth 2 (two) times daily.  . vitamin B-12 (CYANOCOBALAMIN) 1000 MCG tablet Take 1,000 mcg by mouth daily.     [place] Office Visit from 09/01/2020 in [facility]  PHQ-9 Total Score 0      Objective:   Today's Vitals: BP 126/76   Pulse 74   Temp (!) 97.5 F (36.4 C) (Temporal)   Ht 5\' 6"  (1.676 m)   Wt 207 lb 6.4 oz (94.1 kg)   SpO2 98%   BMI 33.48 kg/m  Vitals with BMI 09/01/2020 08/09/2020 06/02/2020  Height 5\' 6"  5\' 6"  5\' 6"   Weight 207 lbs 6 oz 203 lbs 208 lbs  BMI 33.49 62.69 48.54  Systolic 627 035 009  Diastolic 76 73 80  Pulse 74 64 75     Physical Exam  She remains obese and has gained some weight since last time.  Blood pressure is excellent.  The rash is erythematous and looks allergic in nature and not infectious.     Assessment   1. Hot flashes due to menopause   2. Mixed hyperlipidemia   3. Essential hypertension   4. Obesity (BMI 30-39.9)       Tests ordered Orders Placed This Encounter  Procedures  . Basic metabolic panel  . T3, free  . TSH  . Progesterone  . Estradiol     Plan: 1. Continue with estradiol 1.5 mg daily and progesterone 400 mg at night and we will check levels again and aim to further optimize as needed. 2. Continue with the same dose of NP thyroid and we will check thyroid function.  I think they may be room to further optimize also. 3. Continue with same antihypertensive medication which is controlling her blood pressure well. 4. I have sent a prescription of  prednisone in case local hydrocortisone does not improve the rash. 5. I will see her in about 2 months time for follow-up  Meds ordered this encounter  Medications  . predniSONE (DELTASONE) 20 MG tablet    Sig: Take 2 tablets (40 mg total) by mouth daily with breakfast.    Dispense:  10 tablet    Refill:  1    Michaele Amundson Luther Parody, MD

## 2020-09-02 ENCOUNTER — Other Ambulatory Visit (INDEPENDENT_AMBULATORY_CARE_PROVIDER_SITE_OTHER): Payer: Self-pay | Admitting: Internal Medicine

## 2020-09-02 LAB — TSH: TSH: <0.01 mIU/L — ABNORMAL LOW (ref 0.40–4.50)

## 2020-09-02 LAB — T3, FREE: T3, Free: 3.9 pg/mL (ref 2.3–4.2)

## 2020-09-02 LAB — PROGESTERONE: Progesterone: 10 ng/mL

## 2020-09-02 LAB — BASIC METABOLIC PANEL
BUN: 13 mg/dL (ref 7–25)
CO2: 24 mmol/L (ref 20–32)
Calcium: 9.4 mg/dL (ref 8.6–10.4)
Chloride: 105 mmol/L (ref 98–110)
Creat: 0.79 mg/dL (ref 0.50–0.99)
Glucose, Bld: 90 mg/dL (ref 65–139)
Potassium: 4.2 mmol/L (ref 3.5–5.3)
Sodium: 138 mmol/L (ref 135–146)

## 2020-09-02 LAB — ESTRADIOL: Estradiol: 84 pg/mL

## 2020-09-02 MED ORDER — NP THYROID 120 MG PO TABS: 120.0000 mg | ORAL_TABLET | Freq: Two times a day (BID) | ORAL | 3 refills | Status: DC

## 2020-09-02 MED ORDER — ESTRADIOL 2 MG PO TABS: 2.0000 mg | ORAL_TABLET | Freq: Every day | ORAL | 3 refills | Status: DC

## 2020-09-06 ENCOUNTER — Ambulatory Visit (INDEPENDENT_AMBULATORY_CARE_PROVIDER_SITE_OTHER): Payer: BC Managed Care – PPO | Admitting: Neurology

## 2020-09-06 DIAGNOSIS — I739 Peripheral vascular disease, unspecified: Secondary | ICD-10-CM

## 2020-09-06 DIAGNOSIS — Z6836 Body mass index (BMI) 36.0-36.9, adult: Secondary | ICD-10-CM

## 2020-09-06 DIAGNOSIS — I350 Nonrheumatic aortic (valve) stenosis: Secondary | ICD-10-CM

## 2020-09-06 DIAGNOSIS — Z952 Presence of prosthetic heart valve: Secondary | ICD-10-CM

## 2020-09-06 DIAGNOSIS — G4733 Obstructive sleep apnea (adult) (pediatric): Secondary | ICD-10-CM | POA: Diagnosis not present

## 2020-09-06 DIAGNOSIS — I5022 Chronic systolic (congestive) heart failure: Secondary | ICD-10-CM

## 2020-09-07 NOTE — Progress Notes (Signed)
Please call the patient.  Read to her what I wrote about her blood work and further recommendations regarding her hormones.  Thanks.

## 2020-09-07 NOTE — Progress Notes (Signed)
Pt called and notified. She is going into app and review. Will reply back she got instructions.

## 2020-09-09 NOTE — Progress Notes (Signed)
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 She is an active smoker(!).  Has had Aortic regurgitation (07/20/2014), Aortic stenosis, severe (07/20/2014), Arthritis, DVT (deep venous thrombosis) ([place]),  CHF ( Dr. Harl Bowie) , GERD (gastroesophageal reflux disease),  History of pneumonia, Hyperlipidemia, Hypertension, Hypothyroidism, Insomnia, unspecified, Muscle weakness (generalized), Leg pain ( was seen by Dr. Krista Blue), Femoral artery stenosis- Peripheral artery disease ([place]), now stented. Mother with CHF, paternal family history of early cardiac death. Patient goes to bed as early as 8-9 PM and wakes up     Epworth sleepiness score: 2/24.  BMI: 32.6 kg/m  Neck Circumference: 15.5"  FINDINGS:  Total Record Time (hours, min): 8 h 2 min Total Sleep Time (hours, min):  6 h 45 min   Percent REM (%):    25.92%    Calculated pAHI (per hour): 19.0      REM pAHI: 17.9    NREM pAHI: 19.4 Supine AHI: 22.1   Oxygen Saturation (%) Mean: 95  Minimum oxygen saturation (%):        91   O2 Saturation Range (%): 91-95  O2Saturation (minutes) <=88%: 0 min  Pulse Mean (bpm):    95  Pulse Range (35-102)   IMPRESSION: This HST confirmed a diagnosis of moderate OSA (obstructive sleep apnea) without REM sleep dependence. Positional data were not suggestive of supine sleep dependent apnea. Moderate to loud snoring was recorded. There was no prolonged hypoxemia present.  The algorithm stated a sleep time of almost 7 hours for the night of the test.    RECOMMENDATION: The patient's fatigue may improve  under OSA therapy, and PAP therapy is preferred in a patient with CHF history. I recommend therapy with an autotitration device, settings of 5-16 cm water, 2 cm EPR, humidity, and a mask of patient's choice ( nasal pillow, nasal ,or FFM should be offered and discussed).     INTERPRETING PHYSICIAN:  Larey Seat, MD    [facility] and [facility] Sleep Board certified by The [employer] of Sleep Medicine and Diplomate of the [employer] of Sleep Medicine. Board certified In Neurology through the [place], Fellow of the [employer] of Neurology. Medical Director of [employer].  Sleep Summary  Oxygen Saturation Statistics   Start Study Time: End Study Time: Total Recording Time:         10:13:51 PM 6:15:51 AM 8 h, 2 min  Total Sleep Time % REM of Sleep Time:  6 h, 45 min  25.9    Mean: 95 Minimum: 91 Maximum: 99  Mean of Desaturations Nadirs (%):   93  Oxygen Desatur. %:  4-9 10-20 >20 Total  Events Number Total   32  1 97.0 3.0  0 0.0  33 100.0  Oxygen Saturation: <90 <=88 <85 <80 <70  Duration (minutes): Sleep % 0.0 0.0 0.0 0.0 0.0 0.0 0.0 0.0 0.0 0.0     Respiratory Indices      Total Events REM NREM All Night  pRDI: pAHI 3%: ODI 4%: pAHIc 3%: pAHI 4%:  133 125   33  13  45 18.5 17.9 1.2 5.1 20.8  19.4 6.3 1.3 20.2 19.0 5.0 2.2 6.8       Pulse Rate Statistics during Sleep (BPM)      Mean: 74 Minimum: 55 Maximum: 102         Body Position Statistics  Position Supine Prone Right Left Non-Supine  Sleep (min) 100.5 207.5 44.5 52.5 304.5  Sleep % 24.8 51.2 11.0 13.0 75.2  pRDI 23.3 14.9 16.4 39.0 19.2  pAHI 3% 22.1 14.7 15.0 34.3 18.0  ODI 4% 8.8 1.8 4.1 11.8 3.8     Snoring Statistics Snoring Level (dB) >40 >50 >60 >70 >80 >Threshold (45)  Sleep (min) 93.9 18.6 7.1 0.0 0.0 51.1  Sleep % 23.2 4.6 1.8 0.0 0.0 12.6    Mean: 45.9 dB

## 2020-09-19 ENCOUNTER — Encounter (INDEPENDENT_AMBULATORY_CARE_PROVIDER_SITE_OTHER): Payer: Self-pay | Admitting: Internal Medicine

## 2020-09-19 MED ORDER — PANTOPRAZOLE SODIUM 40 MG PO TBEC: 40.0000 mg | DELAYED_RELEASE_TABLET | Freq: Two times a day (BID) | ORAL | 3 refills | Status: DC

## 2020-09-22 ENCOUNTER — Ambulatory Visit (INDEPENDENT_AMBULATORY_CARE_PROVIDER_SITE_OTHER): Payer: BC Managed Care – PPO | Admitting: Internal Medicine

## 2020-09-22 ENCOUNTER — Other Ambulatory Visit: Payer: Self-pay

## 2020-09-22 ENCOUNTER — Encounter (INDEPENDENT_AMBULATORY_CARE_PROVIDER_SITE_OTHER): Payer: Self-pay | Admitting: Internal Medicine

## 2020-09-22 ENCOUNTER — Ambulatory Visit (HOSPITAL_COMMUNITY)
Admission: RE | Admit: 2020-09-22 | Discharge: 2020-09-22 | Disposition: A | Discharge status: Home / Self Care | Payer: BC Managed Care – PPO | Source: Ambulatory Visit | Attending: Internal Medicine | Admitting: Internal Medicine

## 2020-09-22 VITALS — BP 143/86 | HR 85 | Temp 97.8°F | Resp 18 | Ht 66.0 in | Wt 208.0 lb

## 2020-09-22 DIAGNOSIS — R1032 Left lower quadrant pain: Secondary | ICD-10-CM | POA: Insufficient documentation

## 2020-09-22 DIAGNOSIS — K219 Gastro-esophageal reflux disease without esophagitis: Secondary | ICD-10-CM | POA: Diagnosis not present

## 2020-09-22 MED ORDER — METRONIDAZOLE 500 MG PO TABS
500.0000 mg | ORAL_TABLET | Freq: Three times a day (TID) | ORAL | 0 refills | Status: DC
Start: 2020-09-22 — End: 2020-09-26

## 2020-09-22 MED ORDER — CIPROFLOXACIN HCL 500 MG PO TABS: 500.0000 mg | ORAL_TABLET | Freq: Two times a day (BID) | ORAL | 0 refills | Status: DC

## 2020-09-22 NOTE — Progress Notes (Signed)
Pt

## 2020-09-22 NOTE — Progress Notes (Signed)
Metrics: Intervention Frequency ACO  Documented Smoking Status Yearly  Screened one or more times in 24 months  Cessation Counseling or  Active cessation medication Past 24 months  Past 24 months   Guideline developer: UpToDate (See UpToDate for funding source) Date Released: 2014       Wellness Office Visit  Subjective:  Patient ID: Michelle Patton, female    DOB: Jun 16, 1955  Age: 65 y.o. MRN: [id number]  CC: Abdominal pain, gastroesophageal reflux symptoms. HPI  This lady communicated me with me over the weekend and we decided to change her Aciphex to Protonix for her acid reflux symptoms.  However, she also describes abdominal pain with an episode of rectal bleeding.  She denies any fever. She did have a colonoscopy in January of this year which showed diverticular disease in the sigmoid colon. Past Medical History:  Diagnosis Date  . Anxiety   . Aortic regurgitation 07/20/2014  . Aortic stenosis, severe 07/20/2014  . Arthritis   . DVT (deep venous thrombosis) ([place])   . Family history of adverse reaction to anesthesia    Reports father deliurm in his 87's with CABG  . GERD (gastroesophageal reflux disease)   . History of pneumonia   . Hyperlipidemia   . Hypertension   . Insomnia, unspecified   . Muscle weakness (generalized)   . Peripheral artery disease ([place])   . 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 Sleep apnea    diagnosed multiple years ago at [facility]  . Supravalvular aortic stenosis, congenital - s/p repair during childhood    Past Surgical History:  Procedure Laterality Date  . ABDOMINAL AORTAGRAM  06/24/12  . ABDOMINAL AORTAGRAM N/A 06/24/2012   Procedure: ABDOMINAL Maxcine Ham;  Surgeon: Angelia Mould, MD;  Location: [facility] CATH LAB;  Service: Cardiovascular;  Laterality: N/A;  . AORTIC  VALVE REPLACEMENT N/A 10/07/2014   Procedure: REDO AORTIC VALVE REPLACEMENT (AVR);  Surgeon: Rexene Alberts, MD;  Location: [place];  Service: Open Heart Surgery;  Laterality: N/A;  . ASCENDING AORTIC ROOT REPLACEMENT N/A 10/07/2014   Procedure: ASCENDING AORTIC ROOT REPLACEMENT;  Surgeon: Rexene Alberts, MD;  Location: [place];  Service: Open Heart Surgery;  Laterality: N/A;  . BIOPSY  09/27/2016   Procedure: BIOPSY;  Surgeon: Daneil Dolin, MD;  [facility];  Service: Endoscopy;;  colon  . BREAST REDUCTION SURGERY Bilateral 01/21/2018   Procedure: BREAST REDUCTION WITH LIPOSUCTION;  Surgeon: Cristine Polio, MD;  Location: [place];  Service: Plastics;  Laterality: Bilateral;  . [place]  . CARPAL TUNNEL RELEASE Right 03/02/2020   Procedure: CARPAL TUNNEL RELEASE;  Surgeon: Carole Civil, MD;  Location: [facility];  Service: Orthopedics;  Laterality: Right;  . CATARACT EXTRACTION W/PHACO Left 05/30/2019   Procedure: CATARACT EXTRACTION PHACO AND INTRAOCULAR LENS PLACEMENT (IOC) (CDE: 4.94  );  Surgeon: Baruch Goldmann, MD;  Location: [facility];  Service: Ophthalmology;  Laterality: Left;  . CATARACT EXTRACTION W/PHACO Right 06/16/2019   Procedure: CATARACT EXTRACTION PHACO AND INTRAOCULAR LENS PLACEMENT (IOC);  Surgeon: Baruch Goldmann, MD;  [facility]: [facility];  Service: Ophthalmology;  Laterality: Right;  CDE: 4.64  . CERVICAL FUSION    . CHOLECYSTECTOMY    . COLONOSCOPY WITH PROPOFOL N/A 09/27/2016   Dr. Gala Romney: Diverticulosis, several tubular adenomas removed ranging 4 to  7 mm in size, internal grade 1 hemorrhoids, terminal ileum normal, segmental biopsies negative for microscopic colitis.  Next colonoscopy June 2021  . COLONOSCOPY WITH PROPOFOL N/A 05/06/2020   Procedure: COLONOSCOPY WITH PROPOFOL;  Surgeon: Daneil Dolin, MD;  [facility];  Service: Endoscopy;  Laterality: N/A;  7:30am  . ESOPHAGOGASTRODUODENOSCOPY (EGD) WITH PROPOFOL N/A  09/27/2016   Dr. Gala Romney: Small hiatal hernia, mild Schatzki ring status post disruption, LA grade a esophagitis  . ILIAC ARTERY STENT Left 12/2007  . KNEE ARTHROSCOPY WITH MEDIAL MENISECTOMY Right 01/10/2018   Procedure: RIGHT KNEE ARTHROSCOPY WITH PARTIAL MEDIAL MENISECTOMY;  Surgeon: Carole Civil, MD;  [facility];  Service: Orthopedics;  Laterality: Right;  . LEFT AND RIGHT HEART CATHETERIZATION WITH CORONARY ANGIOGRAM N/A 07/31/2014   Procedure: LEFT AND RIGHT HEART CATHETERIZATION WITH CORONARY ANGIOGRAM;  Surgeon: Burnell Blanks, MD;  Location: [facility] CATH LAB;  Service: Cardiovascular;  Laterality: N/A;  . Venia Minks DILATION N/A 09/27/2016   Procedure: Venia Minks DILATION;  Surgeon: Daneil Dolin, MD;  [facility];  Service: Endoscopy;  Laterality: N/A;  . POLYPECTOMY  09/27/2016   Procedure: POLYPECTOMY;  Surgeon: Daneil Dolin, MD;  [facility];  Service: Endoscopy;;  colon  . ROTATOR CUFF REPAIR Bilateral   . TEE WITHOUT CARDIOVERSION N/A 07/07/2014   Procedure: TRANSESOPHAGEAL ECHOCARDIOGRAM (TEE);  Surgeon: Arnoldo Lenis, MD;  [facility];  Service: Cardiology;  Laterality: N/A;  . TEE WITHOUT CARDIOVERSION N/A 07/20/2014   Procedure: TRANSESOPHAGEAL ECHOCARDIOGRAM (TEE) WITH PROPOFOL;  Surgeon: Arnoldo Lenis, MD;  [facility];  Service: Endoscopy;  Laterality: N/A;  . TEE WITHOUT CARDIOVERSION N/A 10/07/2014   Procedure: TRANSESOPHAGEAL ECHOCARDIOGRAM (TEE);  Surgeon: Rexene Alberts, MD;  Location: [facility];  Service: Open Heart Surgery;  Laterality: N/A;     Family History  Problem Relation Age of Onset  . Hypertension Mother   . Hypertension Father   . Heart disease Father        before age 58  . Other Father        varicose veins  . COPD Father   . Colon cancer Neg Hx   . Celiac disease Neg Hx   . Inflammatory bowel disease Neg Hx     Social History   Social History Narrative   Patient is married   for 5 years .  Patient works at [employer].. 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   Social History   Tobacco Use  . Smoking status: Current Every Day Smoker    Packs/day: 0.50    Years: 40.00    Pack years: 20.00    Types: Cigarettes  . Smokeless tobacco: Never Used  Substance Use Topics  . Alcohol use: Yes    Alcohol/week: 2.0 standard drinks    Types: 2 Cans of beer per week    Current Meds  Medication Sig  . aspirin EC 81 MG tablet Take 1 tablet (81 mg total) by mouth daily. Swallow whole.  . Cholecalciferol (VITAMIN D3) 5000 UNITS TABS Take 10,000 Units by mouth every morning.   . ciprofloxacin (CIPRO) 500 MG tablet Take 1 tablet (500 mg total) by mouth 2 (two) times daily for 10 days.  . diclofenac (VOLTAREN) 75 MG EC tablet TAKE 1 TABLET TWICE A DAY (DO NOT CRUSH) (Patient taking differently: Take 75 mg by mouth 2 (two) times daily.)  . estradiol (ESTRACE) 2 MG tablet Take 1 tablet (2  mg total) by mouth daily.  . furosemide (LASIX) 40 MG tablet Take 1 tablet (40 mg total) by mouth daily as needed for edema. (Patient taking differently: Take 40 mg by mouth daily as needed for fluid.)  . losartan (COZAAR) 25 MG tablet TAKE 1 TABLET DAILY (Patient taking differently: Take 25 mg by mouth daily.)  . melatonin 5 MG TABS Take 5 mg by mouth at bedtime.  . metoprolol tartrate (LOPRESSOR) 25 MG tablet Take 1 tablet (25 mg total) by mouth 2 (two) times daily.  . metroNIDAZOLE (FLAGYL) 500 MG tablet Take 1 tablet (500 mg total) by mouth 3 (three) times daily.  . Multiple Minerals (CALCIUM-MAGNESIUM-ZINC) TABS Take 1 tablet by mouth daily.  . Multiple Vitamin (MULTIVITAMIN WITH MINERALS) TABS tablet Take 1 tablet by mouth daily.  . NP THYROID 120 MG tablet Take 1 tablet (120 mg total) by mouth in the morning and at bedtime.  . pantoprazole (PROTONIX) 40 MG tablet Take 1 tablet (40 mg total) by mouth 2 (two) times daily.  . predniSONE (DELTASONE) 20 MG  tablet Take 2 tablets (40 mg total) by mouth daily with breakfast.  . pregabalin (LYRICA) 50 MG capsule Take 1 capsule (50 mg total) by mouth at bedtime.  . RABEprazole (ACIPHEX) 20 MG tablet Take 1 tablet (20 mg total) by mouth 2 (two) times daily before a meal.  . varenicline (CHANTIX) 1 MG tablet Take 1 tablet (1 mg total) by mouth 2 (two) times daily.  . vitamin B-12 (CYANOCOBALAMIN) 1000 MCG tablet Take 1,000 mcg by mouth daily.     [place] Office Visit from 09/01/2020 in [facility]  PHQ-9 Total Score 0      Objective:   Today's Vitals: BP (!) 143/86 (BP Location: Right Arm, Patient Position: Sitting, Cuff Size: Normal)   Pulse 85   Temp 97.8 F (36.6 C) (Temporal)   Resp 18   Ht 5\' 6"  (1.676 m)   Wt 208 lb (94.3 kg)   BMI 33.57 kg/m  Vitals with BMI 09/22/2020 09/01/2020 08/09/2020  Height 5\' 6"  5\' 6"  5\' 6"   Weight 208 lbs 207 lbs 6 oz 203 lbs  BMI 33.59 24.58 09.98  Systolic 338 250 539  Diastolic 86 76 73  Pulse 85 74 64     Physical Exam  She is afebrile.  She does not appear to be in any acute pain.  Abdominal examination shows tenderness in the left mid and lower quadrants.  She has percussion tenderness.  Bowel sounds are heard but scanty.  He does not appear to have an obvious acute abdomen     Assessment   1. Left lower quadrant abdominal pain   2. Gastroesophageal reflux disease, unspecified whether esophagitis present       Tests ordered Orders Placed This Encounter  Procedures  . CT Abdomen Pelvis Wo Contrast  . CBC  . COMPLETE METABOLIC PANEL WITH GFR     Plan: 1. In addition to the GERD, I think this lady probably has diverticulitis. 2. Blood work is ordered. 3. I want to get her to have an stat CT abdominal scan to make sure there is nothing else going on. 4. I will treat her empirically with a combination of metronidazole and ciprofloxacin for presumed diverticulitis and then further recommendations will depend on all the  above results. I told the patient that should her symptoms worsen, she should go directly to the emergency room.   Meds ordered this encounter  Medications  .  ciprofloxacin (CIPRO) 500 MG tablet    Sig: Take 1 tablet (500 mg total) by mouth 2 (two) times daily for 10 days.    Dispense:  20 tablet    Refill:  0  . metroNIDAZOLE (FLAGYL) 500 MG tablet    Sig: Take 1 tablet (500 mg total) by mouth 3 (three) times daily.    Dispense:  30 tablet    Refill:  0    Ikea Demicco Luther Parody, MD

## 2020-09-23 LAB — COMPLETE METABOLIC PANEL WITH GFR
AG Ratio: 1.7 (calc) (ref 1.0–2.5)
ALT: 18 U/L (ref 6–29)
AST: 17 U/L (ref 10–35)
Albumin: 4 g/dL (ref 3.6–5.1)
Alkaline phosphatase (APISO): 59 U/L (ref 37–153)
BUN: 15 mg/dL (ref 7–25)
CO2: 25 mmol/L (ref 20–32)
Calcium: 9 mg/dL (ref 8.6–10.4)
Chloride: 105 mmol/L (ref 98–110)
Creat: 0.77 mg/dL (ref 0.50–0.99)
GFR, Est African American: 95 mL/min/{1.73_m2} (ref >=60)
GFR, Est Non African American: 82 mL/min/{1.73_m2} (ref >=60)
Globulin: 2.4 g/dL (calc) (ref 1.9–3.7)
Glucose, Bld: 101 mg/dL (ref 65–139)
Potassium: 4.2 mmol/L (ref 3.5–5.3)
Sodium: 138 mmol/L (ref 135–146)
Total Bilirubin: 0.3 mg/dL (ref 0.2–1.2)
Total Protein: 6.4 g/dL (ref 6.1–8.1)

## 2020-09-23 LAB — CBC
HCT: 36.1 % (ref 35.0–45.0)
Hemoglobin: 12 g/dL (ref 11.7–15.5)
MCH: 29.4 pg (ref 27.0–33.0)
MCHC: 33.2 g/dL (ref 32.0–36.0)
MCV: 88.5 fL (ref 80.0–100.0)
MPV: 9.8 fL (ref 7.5–12.5)
Platelets: 275 10*3/uL (ref 140–400)
RBC: 4.08 10*6/uL (ref 3.80–5.10)
RDW: 11.6 % (ref 11.0–15.0)
WBC: 7.3 10*3/uL (ref 3.8–10.8)

## 2020-09-24 NOTE — Procedures (Signed)
[facility] TEST (Watch PAT)  STUDY DATA LOAD: 09/09/20  DOB: 08-28-1955  MRN: [id number]  ORDERING CLINICIAN: Larey Seat, MD   REFERRING CLINICIAN: Doree Albee, MD   CLINICAL INFORMATION/HISTORY: seen on 08/09/20: 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 She is an active smoker(!).  Has had Aortic regurgitation (07/20/2014), Aortic stenosis, severe (07/20/2014), Arthritis, DVT (deep venous thrombosis) ([place]),  CHF ( Dr. Harl Bowie) , GERD (gastroesophageal reflux disease),  History of pneumonia, Hyperlipidemia, Hypertension, Hypothyroidism, Insomnia, unspecified, Muscle weakness (generalized), Leg pain ( was seen by Dr. Krista Blue), Femoral artery stenosis- Peripheral artery disease ([place]), now stented. Mother with CHF, paternal family history of early cardiac death. Patient goes to bed as early as 8-9 PM and wakes up     Epworth sleepiness score: 2/24.  BMI: 32.6 kg/m  Neck Circumference: 15.5"  FINDINGS:  Total Record Time (hours, min): 8 h 2 min Total Sleep Time (hours, min):  6 h 45 min   Percent REM (%):    25.92%    Calculated pAHI (per hour): 19.0      REM pAHI: 17.9    NREM pAHI: 19.4 Supine AHI: 22.1   Oxygen Saturation (%) Mean: 95  Minimum oxygen saturation (%):        91   O2 Saturation Range (%): 91-95  O2Saturation (minutes) <=88%: 0 min  Pulse Mean (bpm):    95  Pulse Range (35-102)   IMPRESSION: This HST confirmed a diagnosis of moderate OSA (obstructive sleep apnea) without REM sleep dependence. Positional data were not suggestive of supine sleep dependent apnea. Moderate to loud snoring was recorded. There was no prolonged hypoxemia present.  The algorithm stated a sleep time of almost 7 hours for the night of the test.    RECOMMENDATION: The patient's fatigue may improve under  OSA therapy, and PAP therapy is preferred in a patient with CHF history. I recommend therapy with an autotitration device, settings of 5-16 cm water, 2 cm EPR, humidity, and a mask of patient's choice ( nasal pillow, nasal ,or FFM should be offered and discussed).     INTERPRETING PHYSICIAN:  Larey Seat, MD    [facility] and [facility] Sleep Board certified by The [employer] of Sleep Medicine and Diplomate of the [employer] of Sleep Medicine. Board certified In Neurology through the [employer], Fellow of the [employer] of Neurology. Medical Director of [employer].  Sleep Summary  Oxygen Saturation Statistics   Start Study Time: End Study Time: Total Recording Time:         10:13:51 PM 6:15:51 AM 8 h, 2 min  Total Sleep Time % REM of Sleep Time:  6 h, 45 min  25.9    Mean: 95 Minimum: 91 Maximum: 99  Mean of Desaturations Nadirs (%):   93  Oxygen Desatur. %:  4-9 10-20 >20 Total  Events Number Total   32  1 97.0 3.0  0 0.0  33 100.0  Oxygen Saturation: <90 <=88 <85 <80 <70  Duration (minutes): Sleep % 0.0 0.0 0.0 0.0 0.0 0.0 0.0 0.0 0.0 0.0     Respiratory Indices      Total Events REM NREM All Night  pRDI: pAHI 3%: ODI 4%: pAHIc 3%: pAHI 4%:  133 125   33  13  45 18.5 17.9 1.2 5.1 20.8 19.4  6.3 1.3 20.2 19.0 5.0 2.2 6.8       Pulse Rate Statistics during Sleep (BPM)      Mean: 74 Minimum: 55 Maximum: 102         Body Position Statistics  Position Supine Prone Right Left Non-Supine  Sleep (min) 100.5 207.5 44.5 52.5 304.5  Sleep % 24.8 51.2 11.0 13.0 75.2  pRDI 23.3 14.9 16.4 39.0 19.2  pAHI 3% 22.1 14.7 15.0 34.3 18.0  ODI 4% 8.8 1.8 4.1 11.8 3.8     Snoring Statistics Snoring Level (dB) >40 >50 >60 >70 >80 >Threshold (45)  Sleep (min) 93.9 18.6 7.1 0.0 0.0 51.1  Sleep % 23.2 4.6 1.8 0.0 0.0 12.6

## 2020-09-24 NOTE — Progress Notes (Signed)
IMPRESSION: This HST confirmed a diagnosis of moderate OSA (obstructive sleep apnea) without REM sleep dependence. Positional data were not suggestive of supine sleep dependent apnea. Moderate to loud snoring was recorded. There was no prolonged hypoxemia present.  The algorithm stated a sleep time of almost 7 hours for the night of the test.    RECOMMENDATION: The patient's fatigue may improve under OSA therapy, and PAP therapy is preferred in a patient with CHF history. I recommend therapy with an autotitration device, settings of 5-16 cm water, 2 cm EPR, humidity, and a mask of patient's choice ( nasal pillow, nasal ,or FFM should be offered and discussed).     INTERPRETING PHYSICIAN:  Larey Seat, MD

## 2020-09-24 NOTE — Addendum Note (Signed)
Addended by: Larey Seat on: 09/24/2020 12:13 PM   Modules accepted: Orders

## 2020-09-26 ENCOUNTER — Other Ambulatory Visit: Payer: Self-pay

## 2020-09-26 ENCOUNTER — Encounter (HOSPITAL_COMMUNITY): Payer: Self-pay | Admitting: Emergency Medicine

## 2020-09-26 ENCOUNTER — Emergency Department (HOSPITAL_COMMUNITY)
Admission: EM | Admit: 2020-09-26 | Discharge: 2020-09-26 | Disposition: A | Discharge status: Home / Self Care | Payer: BC Managed Care – PPO | Attending: Emergency Medicine | Admitting: Emergency Medicine

## 2020-09-26 DIAGNOSIS — I110 Hypertensive heart disease with heart failure: Secondary | ICD-10-CM | POA: Diagnosis not present

## 2020-09-26 DIAGNOSIS — N3000 Acute cystitis without hematuria: Secondary | ICD-10-CM | POA: Insufficient documentation

## 2020-09-26 DIAGNOSIS — Z955 Presence of coronary angioplasty implant and graft: Secondary | ICD-10-CM | POA: Insufficient documentation

## 2020-09-26 DIAGNOSIS — F17210 Nicotine dependence, cigarettes, uncomplicated: Secondary | ICD-10-CM | POA: Insufficient documentation

## 2020-09-26 DIAGNOSIS — I5022 Chronic systolic (congestive) heart failure: Secondary | ICD-10-CM | POA: Diagnosis not present

## 2020-09-26 DIAGNOSIS — Z7982 Long term (current) use of aspirin: Secondary | ICD-10-CM | POA: Diagnosis not present

## 2020-09-26 DIAGNOSIS — R109 Unspecified abdominal pain: Secondary | ICD-10-CM | POA: Diagnosis present

## 2020-09-26 DIAGNOSIS — Z79899 Other long term (current) drug therapy: Secondary | ICD-10-CM | POA: Diagnosis not present

## 2020-09-26 DIAGNOSIS — K219 Gastro-esophageal reflux disease without esophagitis: Secondary | ICD-10-CM | POA: Diagnosis not present

## 2020-09-26 DIAGNOSIS — R1084 Generalized abdominal pain: Secondary | ICD-10-CM

## 2020-09-26 LAB — CBC
HCT: 37 % (ref 36.0–46.0)
Hemoglobin: 12.5 g/dL (ref 12.0–15.0)
MCH: 30 pg (ref 26.0–34.0)
MCHC: 33.8 g/dL (ref 30.0–36.0)
MCV: 88.7 fL (ref 80.0–100.0)
Platelets: 263 10*3/uL (ref 150–400)
RBC: 4.17 MIL/uL (ref 3.87–5.11)
RDW: 11.8 % (ref 11.5–15.5)
WBC: 5.9 10*3/uL (ref 4.0–10.5)
nRBC: 0 % (ref 0.0–0.2)

## 2020-09-26 LAB — COMPREHENSIVE METABOLIC PANEL
ALT: 46 U/L — ABNORMAL HIGH (ref 0–44)
AST: 45 U/L — ABNORMAL HIGH (ref 15–41)
Albumin: 3.6 g/dL (ref 3.5–5.0)
Alkaline Phosphatase: 55 U/L (ref 38–126)
Anion gap: 6 (ref 5–15)
BUN: 16 mg/dL (ref 8–23)
CO2: 24 mmol/L (ref 22–32)
Calcium: 9.2 mg/dL (ref 8.9–10.3)
Chloride: 107 mmol/L (ref 98–111)
Creatinine, Ser: 1.05 mg/dL — ABNORMAL HIGH (ref 0.44–1.00)
GFR, Estimated: 60 mL/min — ABNORMAL LOW (ref >60)
Glucose, Bld: 126 mg/dL — ABNORMAL HIGH (ref 70–99)
Potassium: 4.3 mmol/L (ref 3.5–5.1)
Sodium: 137 mmol/L (ref 135–145)
Total Bilirubin: 0.6 mg/dL (ref 0.3–1.2)
Total Protein: 6.8 g/dL (ref 6.5–8.1)

## 2020-09-26 LAB — URINALYSIS, ROUTINE W REFLEX MICROSCOPIC
Bilirubin Urine: NEGATIVE
Glucose, UA: NEGATIVE mg/dL
Hgb urine dipstick: NEGATIVE
Ketones, ur: NEGATIVE mg/dL
Nitrite: NEGATIVE
Protein, ur: NEGATIVE mg/dL
Specific Gravity, Urine: 1.011 (ref 1.005–1.030)
pH: 6 (ref 5.0–8.0)

## 2020-09-26 LAB — LIPASE, BLOOD: Lipase: 37 U/L (ref 11–51)

## 2020-09-26 MED ORDER — DICYCLOMINE HCL 20 MG PO TABS: 20.0000 mg | ORAL_TABLET | Freq: Two times a day (BID) | ORAL | 0 refills | Status: DC | PRN

## 2020-09-26 MED ORDER — DICYCLOMINE HCL 10 MG PO CAPS
20.0000 mg | ORAL_CAPSULE | Freq: Once | ORAL | Status: AC
  Administered 2020-09-26: 20 mg via ORAL
  Filled 2020-09-26: qty 2

## 2020-09-26 MED ORDER — CEPHALEXIN 500 MG PO CAPS: 500.0000 mg | ORAL_CAPSULE | Freq: Two times a day (BID) | ORAL | 0 refills | Status: AC

## 2020-09-26 NOTE — Discharge Instructions (Addendum)
As discussed, you do have a urinary tract infection and will be placed on keflex for this.  Stop taking the cipro and the flagyl at this time - sometimes cipro can treat a urinary infection but would have by now if the infection was sensitive to cipro.  Make sure you are drinking plenty of fluids.  You may take the bentyl as needed for abdominal cramping since it seems to have helped you today.  If you notice increasing constipation (from this medicine) you may need to add a stool softener.

## 2020-09-26 NOTE — ED Provider Notes (Signed)
[facility] EMERGENCY DEPARTMENT Provider Note   CSN: [id number] Arrival date & time: 09/26/20  1353     History Chief Complaint  Patient presents with  . Abdominal Pain    Michelle Patton is a 65 y.o. female with a history of HTN, aortic stenosis, GERD (on protonix) PAD, presenting for further evaluation of abdominal pain, described as intermittent sharp stabbing pain, primarily in her right abdomen occurring randomly without any recognized triggers, lasting for minutes or longer.  She denies fevers, chills, dysuria, has been nauseated and increased reflux sx despite a switch from aciphex to protonix by her pcp.  She saw Dr [name] on 6/1 at which time she was started on cipro and flagyl for presumptive diverticulitis.  She has since had a CT scan which was negative study on 6/1.  She tried contacting her GI specialist for f/u care but cannot be seen until September and was advised to get rechecked in the ed.  She has found no alleviators for her symptoms. No cp or sob.    HPI     Past Medical History:  Diagnosis Date  . Anxiety   . Aortic regurgitation 07/20/2014  . Aortic stenosis, severe 07/20/2014  . Arthritis   . DVT (deep venous thrombosis) ([place])   . Family history of adverse reaction to anesthesia    Reports father deliurm in his 68's with CABG  . GERD (gastroesophageal reflux disease)   . History of pneumonia   . Hyperlipidemia   . Hypertension   . Insomnia, unspecified   . Muscle weakness (generalized)   . Peripheral artery disease ([place])   . 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 Sleep apnea    diagnosed multiple years ago at [facility]  . Supravalvular aortic stenosis, congenital - s/p repair during childhood     Patient Active Problem List   Diagnosis Date Noted  . Class 2 severe obesity due  to excess calories with serious comorbidity and body mass index (BMI) of 36.0 to 36.9 in adult [facility]) 08/09/2020  . PAD (peripheral artery disease) ([place]) 08/09/2020  . Aortic valve prosthesis present 08/09/2020  . Chronic systolic congestive heart failure ([facility]) 08/09/2020  . OSA and COPD overlap syndrome ([place]) 08/09/2020  . s/p right carpal tunnel release 03/02/20 04/06/2020  . Closed displaced fracture of proximal phalanx of lesser toe of right foot 12/31/2019  . Carpal tunnel syndrome of right wrist 12/05/2019  . Nondisplaced fracture of fifth right metatarsal bone with routine healing 10/21/2019  . Skin lesion 04/11/2019  . Low back pain without sciatica 01/20/2019  . Upper abdominal pain 01/14/2019  . Fatigue 12/09/2018  . Breast cancer screening 12/09/2018  . 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 S/P right knee arthroscopy 01/10/18 01/18/2018  . Chondromalacia, patella, right   . 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 Personal history of colonic polyps 12/28/2016  . Abdominal pain, epigastric 08/10/2016  . Left sided abdominal pain 08/10/2016  . Esophageal dysphagia 08/10/2016  . 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 Supravalvular aortic stenosis, congenital - s/p repair during childhood   . Essential hypertension   . Hyperlipidemia   . GERD (gastroesophageal reflux disease)   . Aortic stenosis, severe 07/20/2014  . Aortic regurgitation  07/20/2014  . Leg cramps 09/26/2012  . Occlusion and stenosis of carotid artery without mention of cerebral infarction 07/10/2012  . Peripheral vascular disease, unspecified ([place]) 06/12/2012  . Carotid artery bruit 06/12/2012  . CONSTIPATION 12/28/2009  . NAUSEA AND VOMITING 12/28/2009  . DIARRHEA 12/28/2009    Past Surgical History:  Procedure Laterality Date  . ABDOMINAL AORTAGRAM  06/24/12  . ABDOMINAL AORTAGRAM N/A 06/24/2012    Procedure: ABDOMINAL Maxcine Ham;  Surgeon: Angelia Mould, MD;  Location: [facility] CATH LAB;  Service: Cardiovascular;  Laterality: N/A;  . AORTIC VALVE REPLACEMENT N/A 10/07/2014   Procedure: REDO AORTIC VALVE REPLACEMENT (AVR);  Surgeon: Rexene Alberts, MD;  Location: [place];  Service: Open Heart Surgery;  Laterality: N/A;  . ASCENDING AORTIC ROOT REPLACEMENT N/A 10/07/2014   Procedure: ASCENDING AORTIC ROOT REPLACEMENT;  Surgeon: Rexene Alberts, MD;  Location: [place];  Service: Open Heart Surgery;  Laterality: N/A;  . BIOPSY  09/27/2016   Procedure: BIOPSY;  Surgeon: Daneil Dolin, MD;  [facility];  Service: Endoscopy;;  colon  . BREAST REDUCTION SURGERY Bilateral 01/21/2018   Procedure: BREAST REDUCTION WITH LIPOSUCTION;  Surgeon: Cristine Polio, MD;  Location: [place];  Service: Plastics;  Laterality: Bilateral;  . Chloride  . CARPAL TUNNEL RELEASE Right 03/02/2020   Procedure: CARPAL TUNNEL RELEASE;  Surgeon: Carole Civil, MD;  Location: [facility] OR[facility];  Service: Orthopedics;  Laterality: Right;  . CATARACT EXTRACTION W/PHACO Left 05/30/2019   Procedure: CATARACT EXTRACTION PHACO AND INTRAOCULAR LENS PLACEMENT (IOC) (CDE: 4.94  );  Surgeon: Baruch Goldmann, MD;  Location: [facility];  Service: Ophthalmology;  Laterality: Left;  . CATARACT EXTRACTION W/PHACO Right 06/16/2019   Procedure: CATARACT EXTRACTION PHACO AND INTRAOCULAR LENS PLACEMENT (IOC);  Surgeon: Baruch Goldmann, MD;  Location: [facility];  Service: Ophthalmology;  Laterality: Right;  CDE: 4.64  . CERVICAL FUSION    . CHOLECYSTECTOMY    . COLONOSCOPY WITH PROPOFOL N/A 09/27/2016   Dr. Gala Romney: Diverticulosis, several tubular adenomas removed ranging 4 to 7 mm in size, internal grade 1 hemorrhoids, terminal ileum normal, segmental biopsies negative for microscopic colitis.  Next colonoscopy June 2021  . COLONOSCOPY WITH PROPOFOL N/A 05/06/2020   Procedure: COLONOSCOPY WITH PROPOFOL;  Surgeon:  Daneil Dolin, MD;  [facility];  Service: Endoscopy;  Laterality: N/A;  7:30am  . ESOPHAGOGASTRODUODENOSCOPY (EGD) WITH PROPOFOL N/A 09/27/2016   Dr. Gala Romney: Small hiatal hernia, mild Schatzki ring status post disruption, LA grade a esophagitis  . ILIAC ARTERY STENT Left 12/2007  . KNEE ARTHROSCOPY WITH MEDIAL MENISECTOMY Right 01/10/2018   Procedure: RIGHT KNEE ARTHROSCOPY WITH PARTIAL MEDIAL MENISECTOMY;  Surgeon: Carole Civil, MD;  Location: [facility];  Service: Orthopedics;  Laterality: Right;  . LEFT AND RIGHT HEART CATHETERIZATION WITH CORONARY ANGIOGRAM N/A 07/31/2014   Procedure: LEFT AND RIGHT HEART CATHETERIZATION WITH CORONARY ANGIOGRAM;  Surgeon: Burnell Blanks, MD;  Location: [facility] CATH LAB;  Service: Cardiovascular;  Laterality: N/A;  . Venia Minks DILATION N/A 09/27/2016   Procedure: Venia Minks DILATION;  Surgeon: Daneil Dolin, MD;  [facility];  Service: Endoscopy;  Laterality: N/A;  . POLYPECTOMY  09/27/2016   Procedure: POLYPECTOMY;  Surgeon: Daneil Dolin, MD;  [facility];  Service: Endoscopy;;  colon  . ROTATOR CUFF REPAIR Bilateral   . TEE WITHOUT CARDIOVERSION N/A 07/07/2014   Procedure: TRANSESOPHAGEAL ECHOCARDIOGRAM (TEE);  Surgeon: Arnoldo Lenis, MD;  [facility];  Service: Cardiology;  Laterality: N/A;  . TEE WITHOUT CARDIOVERSION N/A 07/20/2014   Procedure: TRANSESOPHAGEAL ECHOCARDIOGRAM (TEE) WITH PROPOFOL;  Surgeon: Arnoldo Lenis, MD;  Location: [facility];  Service: Endoscopy;  Laterality: N/A;  . TEE WITHOUT CARDIOVERSION N/A 10/07/2014   Procedure: TRANSESOPHAGEAL ECHOCARDIOGRAM (TEE);  Surgeon: Rexene Alberts, MD;  Location: [place];  Service: Open Heart Surgery;  Laterality: N/A;     OB History   No obstetric history on file.     Family History  Problem Relation Age of Onset  . Hypertension Mother   . Hypertension Father   . Heart disease Father        before age 61  . Other Father        varicose  veins  . COPD Father   . Colon cancer Neg Hx   . Celiac disease Neg Hx   . Inflammatory bowel disease Neg Hx     Social History   Tobacco Use  . Smoking status: Current Every Day Smoker    Packs/day: 0.50    Years: 40.00    Pack years: 20.00    Types: Cigarettes  . Smokeless tobacco: Never Used  Vaping Use  . Vaping Use: Never used  Substance Use Topics  . Alcohol use: Yes    Alcohol/week: 2.0 standard drinks    Types: 2 Cans of beer per week  . Drug use: No    Home Medications Prior to Admission medications   Medication Sig Start Date End Date Taking? Authorizing Provider  aspirin EC 81 MG tablet Take 1 tablet (81 mg total) by mouth daily. Swallow whole. 12/16/19  Yes Branch, Alphonse Guild, MD  cephALEXin (KEFLEX) 500 MG capsule Take 1 capsule (500 mg total) by mouth 2 (two) times daily for 7 days. 09/26/20 10/03/20 Yes Yazid Pop, Almyra Free, PA-C  Cholecalciferol (VITAMIN D3) 5000 UNITS TABS Take 10,000 Units by mouth every morning.    Yes [provider]  diclofenac (VOLTAREN) 75 MG EC tablet TAKE 1 TABLET TWICE A DAY (DO NOT CRUSH) Patient taking differently: Take 75 mg by mouth 2 (two) times daily. 02/16/20  Yes Carole Civil, MD  dicyclomine (BENTYL) 20 MG tablet Take 1 tablet (20 mg total) by mouth 2 (two) times daily as needed for spasms. 09/26/20  Yes Diego Delancey, Almyra Free, PA-C  estradiol (ESTRACE) 2 MG tablet Take 1 tablet (2 mg total) by mouth daily. 09/02/20  Yes Gosrani, Nimish C, MD  furosemide (LASIX) 40 MG tablet Take 1 tablet (40 mg total) by mouth daily as needed for edema. Patient taking differently: Take 40 mg by mouth daily as needed for fluid. 10/23/17 02/24/20 Yes Branch, Alphonse Guild, MD  losartan (COZAAR) 25 MG tablet TAKE 1 TABLET DAILY Patient taking differently: Take 25 mg by mouth daily. 01/14/20  Yes Branch, Alphonse Guild, MD  melatonin 5 MG TABS Take 5 mg by mouth at bedtime as needed (sleep).   Yes [provider]  metoprolol tartrate (LOPRESSOR) 25 MG tablet  Take 1 tablet (25 mg total) by mouth 2 (two) times daily. 04/05/20  Yes BranchAlphonse Guild, MD  Multiple Vitamin (MULTIVITAMIN WITH MINERALS) TABS tablet Take 1 tablet by mouth daily.   Yes [provider]  NP THYROID 120 MG tablet Take 1 tablet (120 mg total) by mouth in the morning and at bedtime. 09/02/20  Yes Gosrani, Nimish C, MD  pantoprazole (PROTONIX) 40 MG tablet Take 1 tablet (40 mg total) by mouth 2 (two) times daily. 09/19/20 10/19/20 Yes  Doree Albee, MD  progesterone (PROMETRIUM) 200 MG capsule Take 2 capsules (400 mg total) by mouth daily. 06/14/20 09/12/20 Yes Gosrani, Nimish C, MD  predniSONE (DELTASONE) 20 MG tablet Take 2 tablets (40 mg total) by mouth daily with breakfast. Patient not taking: No sig reported 09/01/20   Hurshel Party C, MD  pregabalin (LYRICA) 50 MG capsule Take 1 capsule (50 mg total) by mouth at bedtime. Patient not taking: No sig reported 04/08/20   Carole Civil, MD  RABEprazole (ACIPHEX) 20 MG tablet Take 1 tablet (20 mg total) by mouth 2 (two) times daily before a meal. Patient not taking: Reported on 09/26/2020 12/17/19   Mahala Menghini, PA-C  varenicline (CHANTIX) 1 MG tablet Take 1 tablet (1 mg total) by mouth 2 (two) times daily. Patient not taking: No sig reported 07/27/20   Doree Albee, MD    Allergies    Penicillins and Sulfa antibiotics  Review of Systems   Review of Systems  Constitutional: Negative for chills and fever.  HENT: Negative.   Eyes: Negative.   Respiratory: Negative for chest tightness and shortness of breath.   Cardiovascular: Negative for chest pain.  Gastrointestinal: Positive for abdominal pain and nausea. Negative for vomiting.  Genitourinary: Negative.   Musculoskeletal: Negative for arthralgias, joint swelling and neck pain.  Skin: Negative.  Negative for rash and wound.  Neurological: Negative for dizziness, weakness, light-headedness, numbness and headaches.  Psychiatric/Behavioral: Negative.      Physical Exam Updated Vital Signs BP (!) 135/54 (BP Location: Right Arm)   Pulse 73   Temp 98.2 F (36.8 C) (Oral)   Resp 16   Ht 5\' 6"  (1.676 m)   Wt 94.3 kg   SpO2 100%   BMI 33.57 kg/m   Physical Exam Vitals and nursing note reviewed.  Constitutional:      Appearance: She is well-developed.  HENT:     Head: Normocephalic and atraumatic.  Eyes:     Conjunctiva/sclera: Conjunctivae normal.  Cardiovascular:     Rate and Rhythm: Normal rate and regular rhythm.     Heart sounds: Normal heart sounds.  Pulmonary:     Effort: Pulmonary effort is normal.     Breath sounds: Normal breath sounds. No wheezing.  Abdominal:     General: Bowel sounds are normal.     Palpations: Abdomen is soft.     Tenderness: There is generalized abdominal tenderness. There is no guarding or rebound.  Musculoskeletal:        General: Normal range of motion.     Cervical back: Normal range of motion.  Skin:    General: Skin is warm and dry.  Neurological:     Mental Status: She is alert.     ED Results / Procedures / Treatments   Labs (all labs ordered are listed, but only abnormal results are displayed) Labs Reviewed  COMPREHENSIVE METABOLIC PANEL - Abnormal; Notable for the following components:      Result Value   Glucose, Bld 126 (*)    Creatinine, Ser 1.05 (*)    AST 45 (*)    ALT 46 (*)    GFR, Estimated 60 (*)    All other components within normal limits  URINALYSIS, ROUTINE W REFLEX MICROSCOPIC - Abnormal; Notable for the following components:   APPearance HAZY (*)    Leukocytes,Ua TRACE (*)    Bacteria, UA MANY (*)    All other components within normal limits  URINE CULTURE  LIPASE, BLOOD  CBC    EKG None  Radiology No results found.  Procedures Procedures   Medications Ordered in ED Medications  dicyclomine (BENTYL) capsule 20 mg (20 mg Oral Given 09/26/20 1528)    ED Course  I have reviewed the triage vital signs and the nursing notes.  Pertinent labs  & imaging results that were available during my care of the patient were reviewed by me and considered in my medical decision making (see chart for details).    MDM Rules/Calculators/A&P                          Patient with generalized abdominal pain of unclear etiology.  She was given a dose of Bentyl here which did seem to eliminate her episodic pain.  Additionally she does have a UTI, urine culture was ordered.  She was started on Keflex after confirming that she has had this medication before and does not have an allergy to this medicine.  Review of recent work-up indicates as normal CT abdomen pelvis completed on June 1, this was not repeated today given this test was normal.  She was advised.  Follow-up with her PCP, and will need a lab visit once her antibiotics are completed to confirm her UTI is resolved.  She had been on Cipro and Flagyl for presumptive diverticulitis.  Since this was not seen on her CT scan and her UTI must be resistant to the Cipro since she is on day 5 of this medication, she was asked to discontinue these 2 antibiotics.  Return precautions outlined.  The patient appears reasonably screened and/or stabilized for discharge and I doubt any other medical condition or other [facility] requiring further screening, evaluation, or treatment in the ED at this time prior to discharge.  Final Clinical Impression(s) / ED Diagnoses Final diagnoses:  Generalized abdominal pain  Acute cystitis without hematuria    Rx / DC Orders ED Discharge Orders         Ordered    cephALEXin (KEFLEX) 500 MG capsule  2 times daily        09/26/20 1755    dicyclomine (BENTYL) 20 MG tablet  2 times daily PRN        09/26/20 1756           Evalee Jefferson, PA-C 09/27/20 0032    Luna Fuse, MD 10/08/20 [phone]

## 2020-09-26 NOTE — ED Triage Notes (Signed)
Pt states that she has been having abd pain with N/V/D for the past month. Pt has attempted to follow up with GI and can't get in until September. Pt seen by PCP this past Wednesday, MD ordered CT and abx.  Pt states no improvement with pain or symptoms.

## 2020-09-27 ENCOUNTER — Telehealth: Payer: Self-pay

## 2020-09-27 ENCOUNTER — Encounter (INDEPENDENT_AMBULATORY_CARE_PROVIDER_SITE_OTHER): Payer: Self-pay | Admitting: Internal Medicine

## 2020-09-27 ENCOUNTER — Telehealth (INDEPENDENT_AMBULATORY_CARE_PROVIDER_SITE_OTHER): Payer: Self-pay

## 2020-09-27 NOTE — Telephone Encounter (Signed)
Transition Care Management Follow-up Telephone Call  Date of discharge and from where: 09/26/20 @ 6:22pm; home.  How have you been since you were released from the hospital? Same, not happy.   Any questions or concerns? Yes, Pt asked  why does all establish for GI office & [facility] cannot get in sooner? Pt stated she called & was told they have no openings. And if she is having problems to go to [facility] ER. Very, direct,sharp, non caring instructions.  Apologized to pt for her experience. I  Got message from Dr [name] to see if we can get her in sooner. Urgent appt; she is establish already. While talking to pt for this call; GI office called with a open cancellation appt. We took it for her and pt is happy now.   Items Reviewed:  Did the pt receive and understand the discharge instructions provided? Yes   Medications obtained and verified? No   Other? No   Any new allergies since your discharge? No   Dietary orders reviewed? No  Do you have support at home? Yes   Home Care and Equipment/Supplies: Were home health services ordered? no If so, what is the name of the agency?   Has the agency set up a time to come to the patient's home? not applicable Were any new equipment or medical supplies ordered?  No What is the name of the medical supply agency?  Were you able to get the supplies/equipment? no Do you have any questions related to the use of the equipment or supplies? No  Functional Questionnaire: (I = Independent and D = Dependent) ADLs: I  Bathing/Dressing- I  Meal Prep- I  Eating- I  Maintaining continence- I  Transferring/Ambulation- I  Managing Meds- I  Follow up appointments reviewed:   PCP Hospital f/u appt confirmed? Yes  Scheduled to see DR [name] on  11/09/20@  4 PM.  [facility] f/u appt confirmed? Yes  Scheduled to see [facility] on 10/05/20 @ 8:00AM.  Are transportation arrangements needed? No   If their condition worsens, is the pt aware  to call PCP or go to the Emergency Dept.? Yes  Was the patient provided with contact information for the PCP's office or ED? Yes  Was to pt encouraged to call back with questions or concerns? Yes

## 2020-09-27 NOTE — Telephone Encounter (Signed)
I called pt. No answer, left a message asking pt to call me back.   

## 2020-09-27 NOTE — Telephone Encounter (Signed)
Excellent, thanks. 

## 2020-09-28 LAB — URINE CULTURE: Culture: NO GROWTH

## 2020-09-29 NOTE — Telephone Encounter (Signed)
I called pt. I advised pt that Dr. Brett Fairy  reviewed their sleep study results and found that pt has moderate osa. Dr. Brett Fairy recommends that pt start auto-PAP for treatment. I reviewed PAP compliance expectations with the pt. Pt is agreeable to starting an auto-PAP. I advised pt that an order will be sent to a DME, [facility], and [facility] will call the pt within about one week after they file with the pt's insurance. [facility] will show the pt how to use the machine, fit for masks, and troubleshoot the auto-PAP if needed. A follow up appt was made for insurance purposes with Dr. Brett Fairy on 12/22/20. A letter with all of this information in it will be mailed to the pt as a reminder. I verified with the pt that the address we have on file is correct. Pt verbalized understanding of results. Pt had no questions at this time but was encouraged to call back if questions arise. I have sent the order to [facility] and have received confirmation that they have received the order.

## 2020-10-05 ENCOUNTER — Encounter: Payer: Self-pay | Admitting: Gastroenterology

## 2020-10-05 ENCOUNTER — Telehealth: Payer: Self-pay | Admitting: *Deleted

## 2020-10-05 ENCOUNTER — Other Ambulatory Visit: Payer: Self-pay

## 2020-10-05 ENCOUNTER — Ambulatory Visit: Payer: BC Managed Care – PPO | Admitting: Gastroenterology

## 2020-10-05 VITALS — BP 136/82 | HR 67 | Temp 96.6°F | Ht 66.0 in | Wt 201.0 lb

## 2020-10-05 DIAGNOSIS — R1010 Upper abdominal pain, unspecified: Secondary | ICD-10-CM | POA: Diagnosis not present

## 2020-10-05 DIAGNOSIS — R197 Diarrhea, unspecified: Secondary | ICD-10-CM

## 2020-10-05 DIAGNOSIS — R7989 Other specified abnormal findings of blood chemistry: Secondary | ICD-10-CM

## 2020-10-05 LAB — HEPATIC FUNCTION PANEL
AG Ratio: 1.6 (calc) (ref 1.0–2.5)
ALT: 20 U/L (ref 6–29)
AST: 19 U/L (ref 10–35)
Albumin: 3.9 g/dL (ref 3.6–5.1)
Alkaline phosphatase (APISO): 63 U/L (ref 37–153)
Bilirubin, Direct: 0.1 mg/dL (ref 0.0–0.2)
Globulin: 2.5 g/dL (calc) (ref 1.9–3.7)
Indirect Bilirubin: 0.2 mg/dL (calc) (ref 0.2–1.2)
Total Bilirubin: 0.3 mg/dL (ref 0.2–1.2)
Total Protein: 6.4 g/dL (ref 6.1–8.1)

## 2020-10-05 NOTE — Patient Instructions (Signed)
We are arranging an upper endoscopy in the near future.  I have ordered blood work and stool studies to be done.   Continue pantoprazole (Protonix) daily, 30 minutes before breakfast.   Further recommendations to follow!  It was a pleasure to see you today. I want to create trusting relationships with patients to provide genuine, compassionate, and quality care. I value your feedback. If you receive a survey regarding your visit,  I greatly appreciate you taking time to fill this out.   Annitta Needs, PhD, ANP-BC [facility]

## 2020-10-05 NOTE — H&P (View-Only) (Signed)
Referring Provider: Doree Albee, MD Primary Care Physician:  Doree Albee, MD Primary GI: Dr. Gala Romney   Chief Complaint  Patient presents with   Abdominal Pain    Pain x86months. Pain moves around. Feels like she has ulcer. Mostly constant. Worse when goes to work and has stress. Started taking Chantix and forgot to take Aciphex x4 days. Sx didn't go away after restarting Acipex. Stomach issues continued after stopping Chantix. Has had CT and went to ED   Diarrhea    Has gas with bm   Nausea    Vomited once    HPI:   Michelle Patton is a 65 y.o. female presenting today with a history of peripheral artery disease, hypertension, DVT, sleep apnea, aortic stenosis status post redo aortic root replacement with stentless porcine aortic root graft 2016. History of adenomas with colonoscopy up-to-date as of Jan 2022: internal hemorrhoids, sigmoid diverticulosis. Surveillance colonoscopy due in 2027. EGD last in 2018 with mild Schatzki ring at GE junction, LA Grade A esophagitis.    CT abd/pelvis June 2022 without acute findings.    States symptoms of stomach upset started with Chantix, which was at beginning of April. Had also forgotten to take Aciphex in May 2022 for a few days, then started having pain. Pain was upper abdomen RUQ/LUQ, radiating to lower abdomen, diffusley upper abdomen mainly. Has seen Dr. [name] twice. Had been placed on abx for presumed diverticulitis but patient didn't feel this similar to pain with diverticulitis in the past. Stopped the antibiotics and started different from the ED. Finished on Sunday. Diarrhea started before antibiotics. Pain is underlying but waxes and wanes. Eating worsens pain so she has had decreased oral intake. Taking yogurt. Worsening reflux symptoms. Changed Aciphex to Protonix with some mild relief. Associated nausea.   Having diarrhea several times per day. Started about 2 weeks ago. Prior to this would have BM once or twice a day. Not  associated with abdominal pain. Was given dicyclomine in ED but not taking at home. No prednisone. No Ibuprofen, Advil, Aleve, etc. Persistent headache.   Past Medical History:  Diagnosis Date   Anxiety    Aortic regurgitation 07/20/2014   Aortic stenosis, severe 07/20/2014   Arthritis    DVT (deep venous thrombosis) (HCC)    Family history of adverse reaction to anesthesia    Reports father deliurm in his 84's with CABG   GERD (gastroesophageal reflux disease)    History of pneumonia    Hyperlipidemia    Hypertension    Insomnia, unspecified    Muscle weakness (generalized)    Peripheral artery disease (HCC)    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   Redo sternotomy for 21 mm Medtronic Freestyle porcine aortic root graft w/ reimplantation of left main and right coronary arteries   Sleep apnea    diagnosed multiple years ago at [facility] aortic stenosis, congenital - s/p repair during childhood     Past Surgical History:  Procedure Laterality Date   ABDOMINAL AORTAGRAM  06/24/2012   ABDOMINAL AORTAGRAM N/A 06/24/2012   Procedure: ABDOMINAL Maxcine Ham;  Surgeon: Angelia Mould, MD;  Location: [facility] CATH LAB;  Service: Cardiovascular;  Laterality: N/A;   AORTIC VALVE REPLACEMENT N/A 10/07/2014   Procedure: REDO AORTIC VALVE REPLACEMENT (AVR);  Surgeon: Rexene Alberts, MD;  Location: [place];  Service: Open Heart Surgery;  Laterality: N/A;   ASCENDING AORTIC ROOT REPLACEMENT N/A 10/07/2014  Procedure: ASCENDING AORTIC ROOT REPLACEMENT;  Surgeon: Rexene Alberts, MD;  Location: [place];  Service: Open Heart Surgery;  Laterality: N/A;   BIOPSY  09/27/2016   Procedure: BIOPSY;  Surgeon: Daneil Dolin, MD;  [facility]: [facility];  Service: Endoscopy;;  colon   BREAST REDUCTION SURGERY Bilateral 01/21/2018   Procedure: BREAST REDUCTION WITH LIPOSUCTION;  Surgeon: Cristine Polio, MD;  Location: [place];   Service: Plastics;  Laterality: Bilateral;   CARDIAC VALVE SURGERY  1968   CARPAL TUNNEL RELEASE Right 03/02/2020   Procedure: CARPAL TUNNEL RELEASE;  Surgeon: Carole Civil, MD;  Location: [facility];  Service: Orthopedics;  Laterality: Right;   CATARACT EXTRACTION W/PHACO Left 05/30/2019   Procedure: CATARACT EXTRACTION PHACO AND INTRAOCULAR LENS PLACEMENT (IOC) (CDE: 4.94  );  Surgeon: Baruch Goldmann, MD;  Location: [facility];  Service: Ophthalmology;  Laterality: Left;   CATARACT EXTRACTION W/PHACO Right 06/16/2019   Procedure: CATARACT EXTRACTION PHACO AND INTRAOCULAR LENS PLACEMENT (IOC);  Surgeon: Baruch Goldmann, MD;  Location: [facility];  Service: Ophthalmology;  Laterality: Right;  CDE: 4.64   CERVICAL FUSION     CHOLECYSTECTOMY     COLONOSCOPY WITH PROPOFOL N/A 09/27/2016   Dr. Gala Romney: Diverticulosis, several tubular adenomas removed ranging 4 to 7 mm in size, internal grade 1 hemorrhoids, terminal ileum normal, segmental biopsies negative for microscopic colitis.  Next colonoscopy June 2021   COLONOSCOPY WITH PROPOFOL N/A 05/06/2020   internal hemorrhoids, sigmoid diverticulosis. Surveillance colonoscopy due in 2027   ESOPHAGOGASTRODUODENOSCOPY (EGD) WITH PROPOFOL N/A 09/27/2016   Dr. Gala Romney: Small hiatal hernia, mild Schatzki ring status post disruption, LA grade a esophagitis   ILIAC ARTERY STENT Left 12/2007   KNEE ARTHROSCOPY WITH MEDIAL MENISECTOMY Right 01/10/2018   Procedure: RIGHT KNEE ARTHROSCOPY WITH PARTIAL MEDIAL MENISECTOMY;  Surgeon: Carole Civil, MD;  Location: [facility];  Service: Orthopedics;  Laterality: Right;   LEFT AND RIGHT HEART CATHETERIZATION WITH CORONARY ANGIOGRAM N/A 07/31/2014   Procedure: LEFT AND RIGHT HEART CATHETERIZATION WITH CORONARY ANGIOGRAM;  Surgeon: Burnell Blanks, MD;  Location[facility];  Service: Cardiovascular;  Laterality: N/A;   MALONEY DILATION N/A 09/27/2016   Procedure: Venia Minks DILATION;  Surgeon: Daneil Dolin, MD;   [facility];  Service: Endoscopy;  Laterality: N/A;   POLYPECTOMY  09/27/2016   Procedure: POLYPECTOMY;  Surgeon: Daneil Dolin, MD;  [facility];  Service: Endoscopy;;  colon   ROTATOR CUFF REPAIR Bilateral    TEE WITHOUT CARDIOVERSION N/A 07/07/2014   Procedure: TRANSESOPHAGEAL ECHOCARDIOGRAM (TEE);  Surgeon: Arnoldo Lenis, MD;  [facility];  Service: Cardiology;  Laterality: N/A;   TEE WITHOUT CARDIOVERSION N/A 07/20/2014   Procedure: TRANSESOPHAGEAL ECHOCARDIOGRAM (TEE) WITH PROPOFOL;  Surgeon: Arnoldo Lenis, MD;  Location: [facility];  Service: Endoscopy;  Laterality: N/A;   TEE WITHOUT CARDIOVERSION N/A 10/07/2014   Procedure: TRANSESOPHAGEAL ECHOCARDIOGRAM (TEE);  Surgeon: Rexene Alberts, MD;  Location: [place];  Service: Open Heart Surgery;  Laterality: N/A;    Current Outpatient Medications  Medication Sig Dispense Refill   aspirin EC 81 MG tablet Take 1 tablet (81 mg total) by mouth daily. Swallow whole. 90 tablet 3   Cholecalciferol (VITAMIN D3) 5000 UNITS TABS Take 10,000 Units by mouth every morning.      diclofenac (VOLTAREN) 75 MG EC tablet TAKE 1 TABLET TWICE A DAY (DO NOT CRUSH) (Patient taking differently: Take 75 mg by mouth 2 (two) times daily.) 180 tablet  3   estradiol (ESTRACE) 2 MG tablet Take 1 tablet (2 mg total) by mouth daily. 30 tablet 3   furosemide (LASIX) 40 MG tablet Take 1 tablet (40 mg total) by mouth daily as needed for edema. (Patient taking differently: Take 40 mg by mouth daily as needed for fluid.) 90 tablet 3   losartan (COZAAR) 25 MG tablet TAKE 1 TABLET DAILY (Patient taking differently: Take 25 mg by mouth daily.) 90 tablet 3   melatonin 5 MG TABS Take 5 mg by mouth at bedtime as needed (sleep).     metoprolol tartrate (LOPRESSOR) 25 MG tablet Take 1 tablet (25 mg total) by mouth 2 (two) times daily. 180 tablet 3   Multiple Vitamin (MULTIVITAMIN WITH MINERALS) TABS tablet Take 1 tablet by mouth daily.     NP  THYROID 120 MG tablet Take 1 tablet (120 mg total) by mouth in the morning and at bedtime. 60 tablet 3   pantoprazole (PROTONIX) 40 MG tablet Take 1 tablet (40 mg total) by mouth 2 (two) times daily. 60 tablet 3   progesterone (PROMETRIUM) 200 MG capsule Take 2 capsules (400 mg total) by mouth daily. 180 capsule 1   dicyclomine (BENTYL) 20 MG tablet Take 1 tablet (20 mg total) by mouth 2 (two) times daily as needed for spasms. (Patient not taking: Reported on 10/05/2020) 20 tablet 0   No current facility-administered medications for this visit.    Allergies as of 10/05/2020 - Review Complete 10/05/2020  Allergen Reaction Noted   Penicillins Hives    Sulfa antibiotics Nausea And Vomiting 06/19/2012    Family History  Problem Relation Age of Onset   Hypertension Mother    Hypertension Father    Heart disease Father        before age 45   Other Father        varicose veins   COPD Father    Colon cancer Neg Hx    Celiac disease Neg Hx    Inflammatory bowel disease Neg Hx     Social History   Socioeconomic History   Marital status: Married    Spouse name: Not on file   Number of children: 0   Years of education: some coll   Highest education level: Not on file  Occupational History   Occupation: call center    Employer: [employer]    Comment: [employer]  Tobacco Use   Smoking status: Every Day    Packs/day: 0.50    Years: 40.00    Pack years: 20.00    Types: Cigarettes   Smokeless tobacco: Never  Vaping Use   Vaping Use: Never used  Substance and Sexual Activity   Alcohol use: Yes    Alcohol/week: 2.0 standard drinks    Types: 2 Cans of beer per week   Drug use: No   Sexual activity: Yes    Birth control/protection: None  Other Topics Concern   Not on file  Social History Narrative   Patient is married   for 5 years . Patient works at [employer].. Some college education-did not Writer.Originally from [place].   Social Determinants of Health    Financial Resource Strain: Not on file  Food Insecurity: Not on file  Transportation Needs: Not on file  Physical Activity: Not on file  Stress: Not on file  Social Connections: Not on file    Review of Systems: Gen: Denies fever, chills, anorexia. Denies fatigue, weakness, weight loss.  CV: Denies chest pain, palpitations,  syncope, peripheral edema, and claudication. Resp: Denies dyspnea at rest, cough, wheezing, coughing up blood, and pleurisy. GI: see HPI Derm: Denies rash, itching, dry skin Psych: Denies depression, anxiety, memory loss, confusion. No homicidal or suicidal ideation.  Heme: Denies bruising, bleeding, and enlarged lymph nodes.  Physical Exam: BP 136/82   Pulse 67   Temp (!) 96.6 F (35.9 C) (Temporal)   Ht 5\' 6"  (1.676 m)   Wt 201 lb (91.2 kg)   BMI 32.44 kg/m  General:   Alert and oriented. No distress noted. Pleasant and cooperative.  Head:  Normocephalic and atraumatic. Eyes:  Conjuctiva clear without scleral icterus. Mouth:  mask in place Abdomen:  +BS, soft, non-tender and non-distended. No rebound or guarding. No HSM or masses noted. Msk:  Symmetrical without gross deformities. Normal posture. Extremities:  Without edema. Neurologic:  Alert and  oriented x4 Psych:  Alert and cooperative. Normal mood and affect.  ASSESSMENT: Ahlana Slaydon is a 65 y.o. female presenting today with history of GERD, esophagitis, and now with several month history of abdominal pain and changes in bowel habits.   Abdominal pain: upper abdomen, both RUQ and LUQ as well, worsened with eating, worsening GERD symptoms, decreased oral intake. Symptom onset with Chantix, which she has stopped. Changed from Aciphex to Protonix by PCP with only marginal improvement. CT abd/pelvis June 2022 without acute findings. Treated emipirically for diverticulitis without improvement (CT negative for this). Will pursue EGD as last was in 2018.   Looser stool: onset several weeks ago, several  episodes of diarrhea a day. Not associated with abdominal pain. Checking stool studies. If negative, will recommend dicyclomine.   Elevated transaminases: mild bump with AST 45 and ALT 46. Recheck now. Gallbladder absent.     PLAN:  Continue Protonix daily  Proceed with upper endoscopy by Dr. Abbey Chatters in near future as need to expedite evaluation : the risks, benefits, and alternatives have been discussed with the patient in detail. The patient states understanding and desires to proceed.   Cdiff, GI Path panel  Check HFP (completed after visit and normal.)  Further recommendations to follow!  Annitta Needs, PhD, ANP-BC [facility]

## 2020-10-05 NOTE — Telephone Encounter (Signed)
Called pt. Made aware per Vicente Males can schedule EGD with Dr. Abbey Chatters since Dr. Gala Romney does not have availability. She was fine with that. EGD with propofol, ASA 2 scheduled for 6/27 at 8:45am. Aware will send EGD instructions to [facility].

## 2020-10-05 NOTE — Progress Notes (Signed)
Referring Provider: Doree Albee, MD Primary Care Physician:  Doree Albee, MD Primary GI: Dr. Gala Romney   Chief Complaint  Patient presents with   Abdominal Pain    Pain x73months. Pain moves around. Feels like she has ulcer. Mostly constant. Worse when goes to work and has stress. Started taking Chantix and forgot to take Aciphex x4 days. Sx didn't go away after restarting Acipex. Stomach issues continued after stopping Chantix. Has had CT and went to ED   Diarrhea    Has gas with bm   Nausea    Vomited once    HPI:   Michelle Patton is a 65 y.o. female presenting today with a history of peripheral artery disease, hypertension, DVT, sleep apnea, aortic stenosis status post redo aortic root replacement with stentless porcine aortic root graft 2016. History of adenomas with colonoscopy up-to-date as of Jan 2022: internal hemorrhoids, sigmoid diverticulosis. Surveillance colonoscopy due in 2027. EGD last in 2018 with mild Schatzki ring at GE junction, LA Grade A esophagitis.    CT abd/pelvis June 2022 without acute findings.    States symptoms of stomach upset started with Chantix, which was at beginning of April. Had also forgotten to take Aciphex in May 2022 for a few days, then started having pain. Pain was upper abdomen RUQ/LUQ, radiating to lower abdomen, diffusley upper abdomen mainly. Has seen Dr. [name] twice. Had been placed on abx for presumed diverticulitis but patient didn't feel this similar to pain with diverticulitis in the past. Stopped the antibiotics and started different from the ED. Finished on Sunday. Diarrhea started before antibiotics. Pain is underlying but waxes and wanes. Eating worsens pain so she has had decreased oral intake. Taking yogurt. Worsening reflux symptoms. Changed Aciphex to Protonix with some mild relief. Associated nausea.   Having diarrhea several times per day. Started about 2 weeks ago. Prior to this would have BM once or twice a day. Not  associated with abdominal pain. Was given dicyclomine in ED but not taking at home. No prednisone. No Ibuprofen, Advil, Aleve, etc. Persistent headache.   Past Medical History:  Diagnosis Date   Anxiety    Aortic regurgitation 07/20/2014   Aortic stenosis, severe 07/20/2014   Arthritis    DVT (deep venous thrombosis) (HCC)    Family history of adverse reaction to anesthesia    Reports father deliurm in his 27's with CABG   GERD (gastroesophageal reflux disease)    History of pneumonia    Hyperlipidemia    Hypertension    Insomnia, unspecified    Muscle weakness (generalized)    Peripheral artery disease (HCC)    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   Redo sternotomy for 21 mm Medtronic Freestyle porcine aortic root graft w/ reimplantation of left main and right coronary arteries   Sleep apnea    diagnosed multiple years ago at [facility] aortic stenosis, congenital - s/p repair during childhood     Past Surgical History:  Procedure Laterality Date   ABDOMINAL AORTAGRAM  06/24/2012   ABDOMINAL AORTAGRAM N/A 06/24/2012   Procedure: ABDOMINAL Maxcine Ham;  Surgeon: Angelia Mould, MD;  Location: [facility] CATH LAB;  Service: Cardiovascular;  Laterality: N/A;   AORTIC VALVE REPLACEMENT N/A 10/07/2014   Procedure: REDO AORTIC VALVE REPLACEMENT (AVR);  Surgeon: Rexene Alberts, MD;  Location: [place];  Service: Open Heart Surgery;  Laterality: N/A;   ASCENDING AORTIC ROOT REPLACEMENT N/A 10/07/2014  Procedure: ASCENDING AORTIC ROOT REPLACEMENT;  Surgeon: Rexene Alberts, MD;  Location: [place];  Service: Open Heart Surgery;  Laterality: N/A;   BIOPSY  09/27/2016   Procedure: BIOPSY;  Surgeon: Daneil Dolin, MD;  [facility];  Service: Endoscopy;;  colon   BREAST REDUCTION SURGERY Bilateral 01/21/2018   Procedure: BREAST REDUCTION WITH LIPOSUCTION;  Surgeon: Cristine Polio, MD;  Location: [place];   Service: Plastics;  Laterality: Bilateral;   CARDIAC VALVE SURGERY  1968   CARPAL TUNNEL RELEASE Right 03/02/2020   Procedure: CARPAL TUNNEL RELEASE;  Surgeon: Carole Civil, MD;  Location: [facility];  Service: Orthopedics;  Laterality: Right;   CATARACT EXTRACTION W/PHACO Left 05/30/2019   Procedure: CATARACT EXTRACTION PHACO AND INTRAOCULAR LENS PLACEMENT (IOC) (CDE: 4.94  );  Surgeon: Baruch Goldmann, MD;  Location: [facility];  Service: Ophthalmology;  Laterality: Left;   CATARACT EXTRACTION W/PHACO Right 06/16/2019   Procedure: CATARACT EXTRACTION PHACO AND INTRAOCULAR LENS PLACEMENT (IOC);  Surgeon: Baruch Goldmann, MD;  Location: [facility];  Service: Ophthalmology;  Laterality: Right;  CDE: 4.64   CERVICAL FUSION     CHOLECYSTECTOMY     COLONOSCOPY WITH PROPOFOL N/A 09/27/2016   Dr. Gala Romney: Diverticulosis, several tubular adenomas removed ranging 4 to 7 mm in size, internal grade 1 hemorrhoids, terminal ileum normal, segmental biopsies negative for microscopic colitis.  Next colonoscopy June 2021   COLONOSCOPY WITH PROPOFOL N/A 05/06/2020   internal hemorrhoids, sigmoid diverticulosis. Surveillance colonoscopy due in 2027   ESOPHAGOGASTRODUODENOSCOPY (EGD) WITH PROPOFOL N/A 09/27/2016   Dr. Gala Romney: Small hiatal hernia, mild Schatzki ring status post disruption, LA grade a esophagitis   ILIAC ARTERY STENT Left 12/2007   KNEE ARTHROSCOPY WITH MEDIAL MENISECTOMY Right 01/10/2018   Procedure: RIGHT KNEE ARTHROSCOPY WITH PARTIAL MEDIAL MENISECTOMY;  Surgeon: Carole Civil, MD;  Location: [facility];  Service: Orthopedics;  Laterality: Right;   LEFT AND RIGHT HEART CATHETERIZATION WITH CORONARY ANGIOGRAM N/A 07/31/2014   Procedure: LEFT AND RIGHT HEART CATHETERIZATION WITH CORONARY ANGIOGRAM;  Surgeon: Burnell Blanks, MD;  Location: [facility] CATH LAB;  Service: Cardiovascular;  Laterality: N/A;   MALONEY DILATION N/A 09/27/2016   Procedure: Venia Minks DILATION;  Surgeon: Daneil Dolin, MD;   [facility];  Service: Endoscopy;  Laterality: N/A;   POLYPECTOMY  09/27/2016   Procedure: POLYPECTOMY;  Surgeon: Daneil Dolin, MD;  [facility];  Service: Endoscopy;;  colon   ROTATOR CUFF REPAIR Bilateral    TEE WITHOUT CARDIOVERSION N/A 07/07/2014   Procedure: TRANSESOPHAGEAL ECHOCARDIOGRAM (TEE);  Surgeon: Arnoldo Lenis, MD;  [facility];  Service: Cardiology;  Laterality: N/A;   TEE WITHOUT CARDIOVERSION N/A 07/20/2014   Procedure: TRANSESOPHAGEAL ECHOCARDIOGRAM (TEE) WITH PROPOFOL;  Surgeon: Arnoldo Lenis, MD;  Location[facility];  Service: Endoscopy;  Laterality: N/A;   TEE WITHOUT CARDIOVERSION N/A 10/07/2014   Procedure: TRANSESOPHAGEAL ECHOCARDIOGRAM (TEE);  Surgeon: Rexene Alberts, MD;  Location: [place];  Service: Open Heart Surgery;  Laterality: N/A;    Current Outpatient Medications  Medication Sig Dispense Refill   aspirin EC 81 MG tablet Take 1 tablet (81 mg total) by mouth daily. Swallow whole. 90 tablet 3   Cholecalciferol (VITAMIN D3) 5000 UNITS TABS Take 10,000 Units by mouth every morning.      diclofenac (VOLTAREN) 75 MG EC tablet TAKE 1 TABLET TWICE A DAY (DO NOT CRUSH) (Patient taking differently: Take 75 mg by mouth 2 (two) times daily.) 180 tablet  3   estradiol (ESTRACE) 2 MG tablet Take 1 tablet (2 mg total) by mouth daily. 30 tablet 3   furosemide (LASIX) 40 MG tablet Take 1 tablet (40 mg total) by mouth daily as needed for edema. (Patient taking differently: Take 40 mg by mouth daily as needed for fluid.) 90 tablet 3   losartan (COZAAR) 25 MG tablet TAKE 1 TABLET DAILY (Patient taking differently: Take 25 mg by mouth daily.) 90 tablet 3   melatonin 5 MG TABS Take 5 mg by mouth at bedtime as needed (sleep).     metoprolol tartrate (LOPRESSOR) 25 MG tablet Take 1 tablet (25 mg total) by mouth 2 (two) times daily. 180 tablet 3   Multiple Vitamin (MULTIVITAMIN WITH MINERALS) TABS tablet Take 1 tablet by mouth daily.     NP  THYROID 120 MG tablet Take 1 tablet (120 mg total) by mouth in the morning and at bedtime. 60 tablet 3   pantoprazole (PROTONIX) 40 MG tablet Take 1 tablet (40 mg total) by mouth 2 (two) times daily. 60 tablet 3   progesterone (PROMETRIUM) 200 MG capsule Take 2 capsules (400 mg total) by mouth daily. 180 capsule 1   dicyclomine (BENTYL) 20 MG tablet Take 1 tablet (20 mg total) by mouth 2 (two) times daily as needed for spasms. (Patient not taking: Reported on 10/05/2020) 20 tablet 0   No current facility-administered medications for this visit.    Allergies as of 10/05/2020 - Review Complete 10/05/2020  Allergen Reaction Noted   Penicillins Hives    Sulfa antibiotics Nausea And Vomiting 06/19/2012    Family History  Problem Relation Age of Onset   Hypertension Mother    Hypertension Father    Heart disease Father        before age 59   Other Father        varicose veins   COPD Father    Colon cancer Neg Hx    Celiac disease Neg Hx    Inflammatory bowel disease Neg Hx     Social History   Socioeconomic History   Marital status: Married    Spouse name: Not on file   Number of children: 0   Years of education: some coll   Highest education level: Not on file  Occupational History   Occupation: call center    Employer: [employer]    Comment: [employer]  Tobacco Use   Smoking status: Every Day    Packs/day: 0.50    Years: 40.00    Pack years: 20.00    Types: Cigarettes   Smokeless tobacco: Never  Vaping Use   Vaping Use: Never used  Substance and Sexual Activity   Alcohol use: Yes    Alcohol/week: 2.0 standard drinks    Types: 2 Cans of beer per week   Drug use: No   Sexual activity: Yes    Birth control/protection: None  Other Topics Concern   Not on file  Social History Narrative   Patient is married   for 5 years . Patient works at [employer].. Some college education-did not Writer.Originally from [place].   Social Determinants of Health    Financial Resource Strain: Not on file  Food Insecurity: Not on file  Transportation Needs: Not on file  Physical Activity: Not on file  Stress: Not on file  Social Connections: Not on file    Review of Systems: Gen: Denies fever, chills, anorexia. Denies fatigue, weakness, weight loss.  CV: Denies chest pain, palpitations,  syncope, peripheral edema, and claudication. Resp: Denies dyspnea at rest, cough, wheezing, coughing up blood, and pleurisy. GI: see HPI Derm: Denies rash, itching, dry skin Psych: Denies depression, anxiety, memory loss, confusion. No homicidal or suicidal ideation.  Heme: Denies bruising, bleeding, and enlarged lymph nodes.  Physical Exam: BP 136/82   Pulse 67   Temp (!) 96.6 F (35.9 C) (Temporal)   Ht 5\' 6"  (1.676 m)   Wt 201 lb (91.2 kg)   BMI 32.44 kg/m  General:   Alert and oriented. No distress noted. Pleasant and cooperative.  Head:  Normocephalic and atraumatic. Eyes:  Conjuctiva clear without scleral icterus. Mouth:  mask in place Abdomen:  +BS, soft, non-tender and non-distended. No rebound or guarding. No HSM or masses noted. Msk:  Symmetrical without gross deformities. Normal posture. Extremities:  Without edema. Neurologic:  Alert and  oriented x4 Psych:  Alert and cooperative. Normal mood and affect.  ASSESSMENT: Michelle Patton is a 64 y.o. female presenting today with history of GERD, esophagitis, and now with several month history of abdominal pain and changes in bowel habits.   Abdominal pain: upper abdomen, both RUQ and LUQ as well, worsened with eating, worsening GERD symptoms, decreased oral intake. Symptom onset with Chantix, which she has stopped. Changed from Aciphex to Protonix by PCP with only marginal improvement. CT abd/pelvis June 2022 without acute findings. Treated emipirically for diverticulitis without improvement (CT negative for this). Will pursue EGD as last was in 2018.   Looser stool: onset several weeks ago, several  episodes of diarrhea a day. Not associated with abdominal pain. Checking stool studies. If negative, will recommend dicyclomine.   Elevated transaminases: mild bump with AST 45 and ALT 46. Recheck now. Gallbladder absent.     PLAN:  Continue Protonix daily  Proceed with upper endoscopy by Dr. Abbey Chatters in near future as need to expedite evaluation : the risks, benefits, and alternatives have been discussed with the patient in detail. The patient states understanding and desires to proceed.   Cdiff, GI Path panel  Check HFP (completed after visit and normal.)  Further recommendations to follow!  Annitta Needs, PhD, ANP-BC [facility]

## 2020-10-10 ENCOUNTER — Other Ambulatory Visit: Payer: Self-pay | Admitting: Gastroenterology

## 2020-10-10 LAB — GASTROINTESTINAL PATHOGEN PANEL PCR
C. difficile Tox A/B, PCR: DETECTED — AB
Campylobacter, PCR: NOT DETECTED
Cryptosporidium, PCR: NOT DETECTED
E coli (ETEC) LT/ST PCR: NOT DETECTED
E coli (STEC) stx1/stx2, PCR: NOT DETECTED
E coli 0157, PCR: NOT DETECTED
Giardia lamblia, PCR: NOT DETECTED
Norovirus, PCR: NOT DETECTED
Rotavirus A, PCR: NOT DETECTED
Salmonella, PCR: NOT DETECTED
Shigella, PCR: NOT DETECTED

## 2020-10-10 LAB — C. DIFFICILE GDH AND TOXIN A/B
GDH ANTIGEN: DETECTED
MICRO NUMBER:: 12012395
SPECIMEN QUALITY:: ADEQUATE
TOXIN A AND B: NOT DETECTED

## 2020-10-10 LAB — CLOSTRIDIUM DIFFICILE TOXIN B, QUALITATIVE, REAL-TIME PCR: Toxigenic C. Difficile by PCR: DETECTED — AB

## 2020-10-10 MED ORDER — VANCOMYCIN HCL 125 MG PO CAPS: 125.0000 mg | ORAL_CAPSULE | Freq: Four times a day (QID) | ORAL | 0 refills | Status: AC

## 2020-10-10 NOTE — Progress Notes (Signed)
Cdiff testing indeterminate, with reflex to PCR positive. Treating for Cdiff with vancomycin, which was sent to local pharmacy .

## 2020-10-10 NOTE — H&P (View-Only) (Signed)
Cdiff testing indeterminate, with reflex to PCR positive. Treating for Cdiff with vancomycin, which was sent to local pharmacy .

## 2020-10-11 ENCOUNTER — Telehealth: Payer: Self-pay | Admitting: Internal Medicine

## 2020-10-11 NOTE — Telephone Encounter (Signed)
Pt called this morning saying that she received a MyChart message from Roseanne Kaufman, NP yesterday and wants to talk with her about some questions she has. I told her Vicente Males wasn't in the office today, but I would put a note in. Shortly after we hung up Christ Kick, RMA was talking with patient. I don't know if you still need to call her back or not. [phone]

## 2020-10-11 NOTE — Telephone Encounter (Signed)
Spoke to pt.  Routed note to Roseanne Kaufman, NP under result note.

## 2020-10-15 ENCOUNTER — Telehealth: Payer: Self-pay | Admitting: Internal Medicine

## 2020-10-15 MED ORDER — HYDROCORTISONE (PERIANAL) 2.5 % EX CREA: 1.0000 "application " | TOPICAL_CREAM | Freq: Two times a day (BID) | CUTANEOUS | 1 refills | Status: DC

## 2020-10-15 MED ORDER — FLUCONAZOLE 150 MG PO TABS: 150.0000 mg | ORAL_TABLET | Freq: Once | ORAL | 0 refills | Status: AC

## 2020-10-15 NOTE — Addendum Note (Signed)
Addended by: Annitta Needs on: 10/15/2020 10:50 AM   Modules accepted: Orders

## 2020-10-15 NOTE — Telephone Encounter (Signed)
Pt is miserable.  Says she was up to 2:00 this morning with hemorrhoids bleeding and has raging yeast infection.  Still with diarrhea.  Says her house "reeks of bleach".  She is not happy about having bleach because she had a dog to die from bleach in Oct but she wants to do as we requested.  Has EGD scheduled for 10/18/2020.

## 2020-10-15 NOTE — Telephone Encounter (Signed)
Called pt and made her aware of recommendations.  She was informed on how to administer medications.  Pt voiced understanding and was advised to follow up with PCP if no better.

## 2020-10-15 NOTE — Telephone Encounter (Signed)
Please call patient, she has been on antibiotics for her c-diff and now has a yeast infections and hemorrhoids.  She is miserable and needs something to help.

## 2020-10-15 NOTE — Telephone Encounter (Signed)
I have sent in Diflucan one tablet now. Repeat in 72 hours if no improvement. If no improvement after that, reach out to PCP.   I hope her diarrhea is better??? I have sent in Anusol per rectum twice a day to [facility] sent as well. She can also get an OTC cream like Monistat.

## 2020-10-18 ENCOUNTER — Ambulatory Visit (HOSPITAL_COMMUNITY): Payer: BC Managed Care – PPO | Admitting: Anesthesiology

## 2020-10-18 ENCOUNTER — Other Ambulatory Visit: Payer: Self-pay

## 2020-10-18 ENCOUNTER — Encounter (HOSPITAL_COMMUNITY)
Admission: RE | Disposition: A | Discharge status: Home / Self Care | Payer: Self-pay | Source: Home / Self Care | Attending: Internal Medicine

## 2020-10-18 ENCOUNTER — Encounter (HOSPITAL_COMMUNITY): Payer: Self-pay

## 2020-10-18 ENCOUNTER — Telehealth: Payer: Self-pay | Admitting: Internal Medicine

## 2020-10-18 ENCOUNTER — Ambulatory Visit (HOSPITAL_COMMUNITY)
Admission: RE | Admit: 2020-10-18 | Discharge: 2020-10-18 | Disposition: A | Discharge status: Home / Self Care | Payer: BC Managed Care – PPO | Attending: Internal Medicine | Admitting: Internal Medicine

## 2020-10-18 DIAGNOSIS — Z86718 Personal history of other venous thrombosis and embolism: Secondary | ICD-10-CM | POA: Diagnosis not present

## 2020-10-18 DIAGNOSIS — Z86010 Personal history of colonic polyps: Secondary | ICD-10-CM | POA: Insufficient documentation

## 2020-10-18 DIAGNOSIS — F17210 Nicotine dependence, cigarettes, uncomplicated: Secondary | ICD-10-CM | POA: Diagnosis not present

## 2020-10-18 DIAGNOSIS — K30 Functional dyspepsia: Secondary | ICD-10-CM

## 2020-10-18 DIAGNOSIS — I739 Peripheral vascular disease, unspecified: Secondary | ICD-10-CM | POA: Diagnosis not present

## 2020-10-18 DIAGNOSIS — K2100 Gastro-esophageal reflux disease with esophagitis, without bleeding: Secondary | ICD-10-CM | POA: Diagnosis not present

## 2020-10-18 DIAGNOSIS — Z825 Family history of asthma and other chronic lower respiratory diseases: Secondary | ICD-10-CM | POA: Diagnosis not present

## 2020-10-18 DIAGNOSIS — Z951 Presence of aortocoronary bypass graft: Secondary | ICD-10-CM | POA: Insufficient documentation

## 2020-10-18 DIAGNOSIS — K222 Esophageal obstruction: Secondary | ICD-10-CM | POA: Insufficient documentation

## 2020-10-18 DIAGNOSIS — Z7982 Long term (current) use of aspirin: Secondary | ICD-10-CM | POA: Diagnosis not present

## 2020-10-18 DIAGNOSIS — K319 Disease of stomach and duodenum, unspecified: Secondary | ICD-10-CM | POA: Insufficient documentation

## 2020-10-18 DIAGNOSIS — Z79899 Other long term (current) drug therapy: Secondary | ICD-10-CM | POA: Diagnosis not present

## 2020-10-18 DIAGNOSIS — Z8249 Family history of ischemic heart disease and other diseases of the circulatory system: Secondary | ICD-10-CM | POA: Insufficient documentation

## 2020-10-18 DIAGNOSIS — K2970 Gastritis, unspecified, without bleeding: Secondary | ICD-10-CM | POA: Insufficient documentation

## 2020-10-18 DIAGNOSIS — Z952 Presence of prosthetic heart valve: Secondary | ICD-10-CM | POA: Insufficient documentation

## 2020-10-18 DIAGNOSIS — I10 Essential (primary) hypertension: Secondary | ICD-10-CM | POA: Insufficient documentation

## 2020-10-18 DIAGNOSIS — Z791 Long term (current) use of non-steroidal anti-inflammatories (NSAID): Secondary | ICD-10-CM | POA: Insufficient documentation

## 2020-10-18 DIAGNOSIS — R1011 Right upper quadrant pain: Secondary | ICD-10-CM | POA: Diagnosis present

## 2020-10-18 HISTORY — PX: ESOPHAGOGASTRODUODENOSCOPY (EGD) WITH PROPOFOL: SHX5813

## 2020-10-18 HISTORY — PX: BIOPSY: SHX5522

## 2020-10-18 SURGERY — ESOPHAGOGASTRODUODENOSCOPY (EGD) WITH PROPOFOL
Anesthesia: General

## 2020-10-18 MED ORDER — LACTATED RINGERS IV SOLN: INTRAVENOUS | Status: DC

## 2020-10-18 MED ORDER — LIDOCAINE HCL (CARDIAC) PF 100 MG/5ML IV SOSY
PREFILLED_SYRINGE | INTRAVENOUS | Status: DC | PRN
  Administered 2020-10-18: 50 mg via INTRAVENOUS

## 2020-10-18 MED ORDER — PROPOFOL 10 MG/ML IV BOLUS
INTRAVENOUS | Status: DC | PRN
  Administered 2020-10-18: 30 mg via INTRAVENOUS
  Administered 2020-10-18: 110 mg via INTRAVENOUS

## 2020-10-18 MED ORDER — DIFICID 200 MG PO TABS: 200.0000 mg | ORAL_TABLET | Freq: Two times a day (BID) | ORAL | 0 refills | Status: AC

## 2020-10-18 MED ORDER — STERILE WATER FOR IRRIGATION IR SOLN
Status: DC | PRN
  Administered 2020-10-18: 1.5 mL

## 2020-10-18 NOTE — Anesthesia Preprocedure Evaluation (Addendum)
Anesthesia Evaluation  Patient identified by MRN, date of birth, ID band Patient awake    Reviewed: Allergy & Precautions, NPO status , Patient's Chart, lab work & pertinent test results, reviewed documented beta blocker date and time   History of Anesthesia Complications (+) Family history of anesthesia reaction  Airway Mallampati: II  TM Distance: >3 FB Neck ROM: Full    Dental  (+) Missing, Dental Advisory Given, Chipped   Pulmonary sleep apnea , COPD, Current Smoker and Patient abstained from smoking.,    Pulmonary exam normal breath sounds clear to auscultation       Cardiovascular Exercise Tolerance: Good hypertension, Pt. on medications and Pt. on home beta blockers + Peripheral Vascular Disease (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-2016) and +CHF  Normal cardiovascular exam+ Valvular Problems/Murmurs AS  Rhythm:Regular Rate:Normal  Study Conclusions   - Left ventricle: The cavity size was normal. Wall thickness was  normal. Systolic function was vigorous. The estimated ejection  fraction was in the range of 65% to 70%. Wall motion was normal;  there were no regional wall motion abnormalities. Doppler  parameters are consistent with abnormal left ventricular  relaxation (grade 1 diastolic dysfunction). Doppler parameters  are consistent with indeterminate ventricular filling pressure.  - Aortic valve: 21 mm Medtronic Freestyle stentless porcine valve/aortic root graft in place. There was no significant regurgitation. There was no stenosis. Peak gradient (S): 11 mm Hg.  - Mitral valve: Mildly calcified annulus. Normal thickness leaflets  .  - Tricuspid valve: There was mild regurgitation.  - Pulmonary arteries: PA peak pressure: 35 mm Hg (S).    Neuro/Psych Anxiety  Neuromuscular disease    GI/Hepatic Neg liver ROS, GERD  Medicated,  Endo/Other  negative endocrine  ROS  Renal/GU negative Renal ROS     Musculoskeletal  (+) Arthritis ,   Abdominal   Peds  Hematology negative hematology ROS (+)   Anesthesia Other Findings   Reproductive/Obstetrics                            Anesthesia Physical  Anesthesia Plan  ASA: 3  Anesthesia Plan: General   Post-op Pain Management:    Induction: Intravenous  PONV Risk Score and Plan: TIVA  Airway Management Planned: Nasal Cannula, Natural Airway and Simple Face Mask  Additional Equipment:   Intra-op Plan:   Post-operative Plan:   Informed Consent: I have reviewed the patients History and Physical, chart, labs and discussed the procedure including the risks, benefits and alternatives for the proposed anesthesia with the patient or authorized representative who has indicated his/her understanding and acceptance.     Dental advisory given  Plan Discussed with: CRNA and Surgeon  Anesthesia Plan Comments:                                         Anesthesia Evaluation  Patient identified by MRN, date of birth, ID band Patient awake    Reviewed: Allergy & Precautions, NPO status , Patient's Chart, lab work & pertinent test results, reviewed documented beta blocker date and time   History of Anesthesia Complications (+) Family history of anesthesia reaction  Airway Mallampati: II  TM Distance: >3 FB Neck ROM: Full    Dental  (+) Missing, Dental Advisory Given   Pulmonary sleep apnea , Current Smoker and Patient abstained  from smoking.,    Pulmonary exam normal breath sounds clear to auscultation       Cardiovascular Exercise Tolerance: Good hypertension, Pt. on medications and Pt. on home beta blockers + Peripheral Vascular Disease (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-2016)  Normal cardiovascular exam+ Valvular Problems/Murmurs AS  Rhythm:Regular Rate:Normal  Study Conclusions   - Left ventricle:  The cavity size was normal. Wall thickness was  normal. Systolic function was vigorous. The estimated ejection  fraction was in the range of 65% to 70%. Wall motion was normal;  there were no regional wall motion abnormalities. Doppler  parameters are consistent with abnormal left ventricular  relaxation (grade 1 diastolic dysfunction). Doppler parameters  are consistent with indeterminate ventricular filling pressure.  - Aortic valve: 21 mm Medtronic Freestyle stentless porcine valve/aortic root graft in place. There was no significant regurgitation. There was no stenosis. Peak gradient (S): 11 mm Hg.  - Mitral valve: Mildly calcified annulus. Normal thickness leaflets  .  - Tricuspid valve: There was mild regurgitation.  - Pulmonary arteries: PA peak pressure: 35 mm Hg (S).    Neuro/Psych Anxiety  Neuromuscular disease    GI/Hepatic Neg liver ROS, GERD  Medicated,  Endo/Other  negative endocrine ROS  Renal/GU negative Renal ROS     Musculoskeletal  (+) Arthritis ,   Abdominal   Peds  Hematology negative hematology ROS (+)   Anesthesia Other Findings   Reproductive/Obstetrics                             Anesthesia Physical  Anesthesia Plan  ASA: III  Anesthesia Plan: General   Post-op Pain Management:    Induction: Intravenous  PONV Risk Score and Plan: TIVA  Airway Management Planned: Nasal Cannula, Natural Airway and Simple Face Mask  Additional Equipment:   Intra-op Plan:   Post-operative Plan:   Informed Consent: I have reviewed the patients History and Physical, chart, labs and discussed the procedure including the risks, benefits and alternatives for the proposed anesthesia with the patient or authorized representative who has indicated his/her understanding and acceptance.     Dental advisory given  Plan Discussed with: CRNA and Surgeon  Anesthesia Plan Comments: (General with local block by Dr. Aline Brochure)         Anesthesia Quick Evaluation  Anesthesia Quick Evaluation

## 2020-10-18 NOTE — Interval H&P Note (Signed)
History and Physical Interval Note:  10/18/2020 8:19 AM  Michelle Patton  has presented today for surgery, with the diagnosis of dyspepsia.  The various methods of treatment have been discussed with the patient and family. After consideration of risks, benefits and other options for treatment, the patient has consented to  Procedure(s) with comments: ESOPHAGOGASTRODUODENOSCOPY (EGD) WITH PROPOFOL (N/A) - 8:45am as a surgical intervention.  The patient's history has been reviewed, patient examined, no change in status, stable for surgery.  I have reviewed the patient's chart and labs.  Questions were answered to the patient's satisfaction.     Eloise Harman

## 2020-10-18 NOTE — Transfer of Care (Signed)
Immediate Anesthesia Transfer of Care Note  Patient: Michelle Patton  Procedure(s) Performed: ESOPHAGOGASTRODUODENOSCOPY (EGD) WITH PROPOFOL BIOPSY  Patient Location: Endoscopy Unit  Anesthesia Type:General  Level of Consciousness: awake  Airway & Oxygen Therapy: Patient Spontanous Breathing  Post-op Assessment: Report given to RN and Post -op Vital signs reviewed and stable  Post vital signs: Reviewed and stable  Last Vitals:  Vitals Value Taken Time  BP    Temp    Pulse    Resp    SpO2      Last Pain:  Vitals:   10/18/20 0840  TempSrc:   PainSc: 0-No pain         Complications: No notable events documented.

## 2020-10-18 NOTE — Telephone Encounter (Signed)
Spoke to pt. See other note.  

## 2020-10-18 NOTE — Anesthesia Postprocedure Evaluation (Signed)
Anesthesia Post Note  Patient: Michelle Patton  Procedure(s) Performed: ESOPHAGOGASTRODUODENOSCOPY (EGD) WITH PROPOFOL BIOPSY  Patient location during evaluation: Endoscopy Anesthesia Type: General Level of consciousness: awake and alert and oriented Pain management: pain level controlled Respiratory status: spontaneous breathing and respiratory function stable Cardiovascular status: blood pressure returned to baseline and stable Postop Assessment: no apparent nausea or vomiting Anesthetic complications: no   No notable events documented.   Last Vitals:  Vitals:   10/18/20 0731 10/18/20 0852  BP: (!) 130/56 121/66  Pulse: 78 84  Resp: 14 20  Temp: 36.5 C 36.7 C  SpO2: 97% 96%    Last Pain:  Vitals:   10/18/20 0852  TempSrc: Oral  PainSc: 0-No pain                 Adam Sanjuan C Jolina Symonds

## 2020-10-18 NOTE — Telephone Encounter (Signed)
[phone] please call patient , she has medication questions.

## 2020-10-18 NOTE — Discharge Instructions (Addendum)
EGD Discharge instructions Please read the instructions outlined below and refer to this sheet in the next few weeks. These discharge instructions provide you with general information on caring for yourself after you leave the hospital. Your doctor may also give you specific instructions. While your treatment has been planned according to the most current medical practices available, unavoidable complications occasionally occur. If you have any problems or questions after discharge, please call your doctor. ACTIVITY You may resume your regular activity but move at a slower pace for the next 24 hours.  Take frequent rest periods for the next 24 hours.  Walking will help expel (get rid of) the air and reduce the bloated feeling in your abdomen.  No driving for 24 hours (because of the anesthesia (medicine) used during the test).  You may shower.  Do not sign any important legal documents or operate any machinery for 24 hours (because of the anesthesia used during the test).  NUTRITION Drink plenty of fluids.  You may resume your normal diet.  Begin with a light meal and progress to your normal diet.  Avoid alcoholic beverages for 24 hours or as instructed by your caregiver.  MEDICATIONS You may resume your normal medications unless your caregiver tells you otherwise.  WHAT YOU CAN EXPECT TODAY You may experience abdominal discomfort such as a feeling of fullness or "gas" pains.  FOLLOW-UP Your doctor will discuss the results of your test with you.  SEEK IMMEDIATE MEDICAL ATTENTION IF ANY OF THE FOLLOWING OCCUR: Excessive nausea (feeling sick to your stomach) and/or vomiting.  Severe abdominal pain and distention (swelling).  Trouble swallowing.  Temperature over 101 F (37.8 C).  Rectal bleeding or vomiting of blood.    Your EGD revealed a moderate amount of inflammation in your stomach.  I took biopsies of this to rule infection a bacteria called H. pylori.  We likely need to treat your  C. difficile further if you are still having 5-7 loose stools a day.  I will discuss further with Vicente Males and we will send in medication.  Await pathology results, my office will contact you.  Follow-up with GI in 2 to 3 months.  I hope you have a great rest of your week!  Elon Alas. Abbey Chatters, D.O. Gastroenterology and Hepatology [facility]

## 2020-10-18 NOTE — Telephone Encounter (Addendum)
Returned pt's call.  She wanted me to let Roseanne Kaufman, NP know that Dr. Abbey Chatters mentioned consulting with her.  He thinks she may need another round of antibiotics after these run out in 3 days.  She was also upset because she was to follow up with Roseanne Kaufman, NP but first available appointment scheduled for November.  She is aware that we have her on a cancellation list but really would like sooner if we can.    Pt said having 5 to 7 bm's in the morning and 2 to 3 bm's in the evening.  Dark in color.  Consistency is like mucous and bm falls to the bottom of commode.  She said that she really never got better with vancomycin.  Maybe 2-3 days were a little better.

## 2020-10-18 NOTE — Addendum Note (Signed)
Addended by: Annitta Needs on: 10/18/2020 03:09 PM   Modules accepted: Orders

## 2020-10-18 NOTE — Telephone Encounter (Signed)
Spoke with pt. She was made aware that we sent in RX for Dificid to take BID for 10 days.  She was informed to let us know how it works for her.    Erline Levine Southard:  Roseanne Kaufman, NP would like to see if we could get her in an urgent spot in the next 2 weeks or so.

## 2020-10-18 NOTE — Telephone Encounter (Signed)
I am sending in Dificid to take BID for 10 days. Please keep [place] updated with how this works for her. We might have to do a prior authorization. Sounds like she did not have an adequate response to vancomycin.   Please see if we can get her in an urgent spot in next 2 weeks or so.

## 2020-10-18 NOTE — Telephone Encounter (Signed)
PLEASE CALL PATIENT, SHE HAS SOME CONCERNS

## 2020-10-18 NOTE — Interval H&P Note (Signed)
History and Physical Interval Note:  10/18/2020 8:27 AM  Michelle Patton  has presented today for surgery, with the diagnosis of dyspepsia.  The various methods of treatment have been discussed with the patient and family. After consideration of risks, benefits and other options for treatment, the patient has consented to  Procedure(s) with comments: ESOPHAGOGASTRODUODENOSCOPY (EGD) WITH PROPOFOL (N/A) - 8:45am as a surgical intervention.  The patient's history has been reviewed, patient examined, no change in status, stable for surgery.  I have reviewed the patient's chart and labs.  Questions were answered to the patient's satisfaction.     Eloise Harman

## 2020-10-18 NOTE — Op Note (Signed)
[facility] Patient Name: Michelle Patton Procedure Date: 10/18/2020 8:32 AM MRN: [id number] Date of Birth: November 21, 1955 Attending MD: Elon Alas. Abbey Chatters DO CSN: [id number] Age: 65 Admit Type: Outpatient Procedure:                Upper GI endoscopy Indications:              Abdominal pain in the right upper quadrant,                            Abdominal pain in the left upper quadrant,                            Functional Dyspepsia, Heartburn Providers:                Elon Alas. Abbey Chatters, DO, Caprice Kluver, Raphael Gibney,                            Technician Referring MD:              Medicines:                See the Anesthesia note for documentation of the                            administered medications Complications:            No immediate complications. Estimated Blood Loss:     Estimated blood loss was minimal. Procedure:                Pre-Anesthesia Assessment:                           - The anesthesia plan was to use monitored                            anesthesia care (MAC).                           After obtaining informed consent, the endoscope was                            passed under direct vision. Throughout the                            procedure, the patient's blood pressure, pulse, and                            oxygen saturations were monitored continuously. The                            [id number]-H190 ([id number]) scope was introduced through the                            mouth, and advanced to the second part of duodenum.                            The upper GI endoscopy was  accomplished without                            difficulty. The patient tolerated the procedure                            well. Scope In: 8:44:54 AM Scope Out: 8:49:21 AM Total Procedure Duration: 0 hours 4 minutes 27 seconds  Findings:      A non-obstructing and mild Schatzki ring was found in the distal       esophagus.      Localized moderate inflammation characterized by erosions and  erythema       was found in the gastric antrum. Biopsies were taken with a cold forceps       for Helicobacter pylori testing.      Localized mucosal changes characterized by pale gastric folds were found       in the gastric body. Biopsies were taken with a cold forceps for       histology.      The duodenal bulb, first portion of the duodenum and second portion of       the duodenum were normal. Impression:               - Non-obstructing and mild Schatzki ring.                           - Gastritis. Biopsied.                           - Pale gastric folds mucosa in the gastric body.                            Biopsied.                           - Normal duodenal bulb, first portion of the                            duodenum and second portion of the duodenum. Moderate Sedation:      Per Anesthesia Care Recommendation:           - Patient has a contact number available for                            emergencies. The signs and symptoms of potential                            delayed complications were discussed with the                            patient. Return to normal activities tomorrow.                            Written discharge instructions were provided to the                            patient.                           -  Resume previous diet.                           - Continue present medications.                           - Await pathology results.                           - Return to GI clinic in 3 months.                           - Use a proton pump inhibitor PO BID. Procedure Code(s):        --- Professional ---                           [phone], Esophagogastroduodenoscopy, flexible,                            transoral; with biopsy, single or multiple Diagnosis Code(s):        --- Professional ---                           K22.2, Esophageal obstruction                           K29.70, Gastritis, unspecified, without bleeding                           R10.11,  Right upper quadrant pain                           R10.12, Left upper quadrant pain                           K30, Functional dyspepsia                           R12, Heartburn CPT copyright 2019 [employer]. All rights reserved. The codes documented in this report are preliminary and upon coder review may  be revised to meet current compliance requirements. Elon Alas. Abbey Chatters, DO [place] Abbey Chatters, DO 10/18/2020 9:07:49 AM This report has been signed electronically. Number of Addenda: 0

## 2020-10-19 LAB — SURGICAL PATHOLOGY

## 2020-10-20 ENCOUNTER — Telehealth: Payer: Self-pay | Admitting: Gastroenterology

## 2020-10-20 ENCOUNTER — Telehealth: Payer: Self-pay | Admitting: Internal Medicine

## 2020-10-20 NOTE — Telephone Encounter (Signed)
Tried to call pt earlier but line was busy.  Lmom for pt to call me back.

## 2020-10-20 NOTE — Telephone Encounter (Signed)
Hi Dr. Tarri Glenn,  This patient called to request a transfer of care is currently with Dr. Gala Romney at [place] [facility]. Patient's records are available through [place]. Patient states she is not satisfied with their patient care.  Please advise on scheduling.  Thanks

## 2020-10-20 NOTE — Telephone Encounter (Signed)
Records reviewed. Okay to schedule with me.

## 2020-10-20 NOTE — Telephone Encounter (Signed)
Pt called with questions about her results she had seen on MyChart. She refused to speak with nurse and wanted Christ Kick, RMA only. Please call her at [phone]

## 2020-10-21 ENCOUNTER — Ambulatory Visit (HOSPITAL_COMMUNITY)
Admission: RE | Admit: 2020-10-21 | Discharge: 2020-10-21 | Disposition: A | Discharge status: Home / Self Care | Payer: BC Managed Care – PPO | Source: Ambulatory Visit | Attending: Gastroenterology | Admitting: Gastroenterology

## 2020-10-21 ENCOUNTER — Other Ambulatory Visit: Payer: Self-pay

## 2020-10-21 ENCOUNTER — Telehealth: Payer: Self-pay | Admitting: Gastroenterology

## 2020-10-21 ENCOUNTER — Encounter: Payer: Self-pay | Admitting: Gastroenterology

## 2020-10-21 ENCOUNTER — Encounter (HOSPITAL_COMMUNITY): Payer: Self-pay | Admitting: Radiology

## 2020-10-21 DIAGNOSIS — R1084 Generalized abdominal pain: Secondary | ICD-10-CM | POA: Insufficient documentation

## 2020-10-21 MED ORDER — IOHEXOL 350 MG/ML SOLN
100.0000 mL | Freq: Once | INTRAVENOUS | Status: AC | PRN
  Administered 2020-10-21: 100 mL via INTRAVENOUS

## 2020-10-21 NOTE — Telephone Encounter (Signed)
noted 

## 2020-10-21 NOTE — Telephone Encounter (Signed)
Spoke to pt.  She had multiple complaints that she wanted addressed.  Pt made aware that I would speak to our provider.  Spoke with Roseanne Kaufman, NP and she immediately called the pt to address her issues.

## 2020-10-21 NOTE — Telephone Encounter (Signed)
PA approved via [employer]. Auth# [id number], DOS 10/21/2020-11/19/2020  Spoke with pt. She last ate 25 min ago. She can go today or tomorrow.  Called central scheduling and patient can head over now.  Called pt back and made aware.

## 2020-10-21 NOTE — Addendum Note (Signed)
Addended by: Cheron Every on: 10/21/2020 12:27 PM   Modules accepted: Orders

## 2020-10-21 NOTE — Telephone Encounter (Signed)
2:50 PM approx.  Today spoke with this nice lady while on speaker phone (with her permission) with Angie.  I listened and addressed her issues and concerns.  I told her I would take this and speak to staff.   Which I have handled.  I told her I was appreciative to brining this to my attention and any time that I needed to know about things not to hesitate in letting me know.

## 2020-10-21 NOTE — Telephone Encounter (Signed)
Spoke with patient for an extended amount of time today. Multiple complaints, which I will discuss with office manager.   We need to do CT angiogram, no later than tomorrow, Friday July 1st. Symptoms concerning with postprandial abdominal pain, weight loss, food aversion. Need to assess for chronic mesenteric ischemia?

## 2020-10-22 ENCOUNTER — Telehealth: Payer: Self-pay | Admitting: Internal Medicine

## 2020-10-22 ENCOUNTER — Other Ambulatory Visit: Payer: Self-pay | Admitting: *Deleted

## 2020-10-22 DIAGNOSIS — R1084 Generalized abdominal pain: Secondary | ICD-10-CM

## 2020-10-22 NOTE — Telephone Encounter (Signed)
Called patient and reviewed her CTA results.  She had multiple questions which I answered.  Recent combination of GI issues including exacerbation of GERD C. difficile colitis and now insidiously progressive signs symptoms of mesenteric ischemia over the last several weeks.  Fully agree with IR evaluation/intervention in the near future.  Patient indeed is afraid to eat.  She has been losing weight.  I recommended small meal intake similar to a gastroparesis type diet.  If severe abdominal pain she knows she needs to go immediately to the [facility] ED.  Potential life-threatening nature of this disorder reviewed.  She seems understand.

## 2020-10-22 NOTE — Telephone Encounter (Signed)
Spoke to Dr. Markus Daft, interventional radiologist at [facility].  I have reviewed the case and patient's ongoing symptoms.  I have requested an expedited outpatient evaluation.  He states he will get her in to be seen in clinic the first part of next week.

## 2020-10-26 ENCOUNTER — Other Ambulatory Visit: Payer: Self-pay | Admitting: Interventional Radiology

## 2020-10-26 ENCOUNTER — Encounter (HOSPITAL_COMMUNITY): Payer: Self-pay | Admitting: Internal Medicine

## 2020-10-26 ENCOUNTER — Other Ambulatory Visit (HOSPITAL_COMMUNITY): Payer: Self-pay | Admitting: Interventional Radiology

## 2020-10-26 ENCOUNTER — Other Ambulatory Visit: Payer: Self-pay

## 2020-10-26 ENCOUNTER — Ambulatory Visit
Admission: RE | Admit: 2020-10-26 | Discharge: 2020-10-26 | Disposition: A | Discharge status: Home / Self Care | Payer: BC Managed Care – PPO | Source: Ambulatory Visit | Attending: Gastroenterology | Admitting: Gastroenterology

## 2020-10-26 DIAGNOSIS — R1084 Generalized abdominal pain: Secondary | ICD-10-CM

## 2020-10-26 DIAGNOSIS — K551 Chronic vascular disorders of intestine: Secondary | ICD-10-CM

## 2020-10-26 HISTORY — PX: IR RADIOLOGIST EVAL & MGMT: IMG5224

## 2020-10-26 NOTE — Telephone Encounter (Signed)
Noted  

## 2020-10-26 NOTE — Consult Note (Signed)
Chief Complaint: Patient was seen in telephone consultation today for chronic mesenteric ischemia  Referring Physician(s): Daneil Dolin, MD  Patient Status: [facility] - Out-pt  History of Present Illness: Michelle Patton is a 65 y.o. 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    She developed abdominal pain in March 2022 that has gradually worsened.  The pain is worst after eating, to the point she has begun to avoid eating.  The pain typically starts in the right abdomen and radiates in a diffuse, vague pattern.  If she does eat, she frequently has to lay down on the floor to help manage the pain.  She has been worked up by her PCP and GI for enteritis, diverticulitis, etc including an endoscopy and CT scan.  This prompted a long course of antibiotics which was complicated by C. Diff infection for which she remains on oral antibiotics.  As the pain has worsened, she had a CTA abdomen/pelvis to evaluate for mesenteric ischemia on 10/21/20 which demonstrated a severe, approximately 3 cm proximal stenosis of the SMA and a focal stenosis of the distal celiac trunk.  She has a prominent Catering manager.    She endorses some melena but no hematochezia.  Has has nausea but no vomiting.  No fevers, chills, shortness of breath, chest pain.     Past Medical History:  Diagnosis Date   Anxiety    Aortic regurgitation 07/20/2014   Aortic stenosis, severe 07/20/2014   Arthritis    DVT (deep venous thrombosis) (HCC)    Family history of adverse reaction to anesthesia    Reports father deliurm in his 76's with CABG   GERD (gastroesophageal reflux disease)    History of pneumonia     Hyperlipidemia    Hypertension    Insomnia, unspecified    Muscle weakness (generalized)    Peripheral artery disease (HCC)    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   Redo sternotomy for 21 mm Medtronic Freestyle porcine aortic root graft w/ reimplantation of left main and right coronary arteries   Sleep apnea    diagnosed multiple years ago at [facility] aortic stenosis, congenital - s/p repair during childhood     Past Surgical History:  Procedure Laterality Date   ABDOMINAL AORTAGRAM  06/24/2012   ABDOMINAL AORTAGRAM N/A 06/24/2012   Procedure: ABDOMINAL Maxcine Ham;  Surgeon: Angelia Mould, MD;  Location: [facility] CATH [facility];  Service: Cardiovascular;  Laterality: N/A;   AORTIC VALVE REPLACEMENT N/A 10/07/2014   Procedure: REDO AORTIC VALVE REPLACEMENT (AVR);  Surgeon: Rexene Alberts, MD;  Location: [place];  Service: Open Heart Surgery;  Laterality: N/A;   ASCENDING AORTIC ROOT REPLACEMENT N/A 10/07/2014   Procedure: ASCENDING AORTIC ROOT REPLACEMENT;  Surgeon: Rexene Alberts, MD;  Location: [place];  Service: Open Heart Surgery;  Laterality: N/A;   BIOPSY  09/27/2016   Procedure: BIOPSY;  Surgeon: Daneil Dolin, MD;  [facility];  Service: Endoscopy;;  colon   BIOPSY  10/18/2020   Procedure: BIOPSY;  Surgeon: Eloise Harman, DO;  [facility];  Service: Endoscopy;;   BREAST REDUCTION SURGERY Bilateral 01/21/2018   Procedure: BREAST  REDUCTION WITH LIPOSUCTION;  Surgeon: Cristine Polio, MD;  Location: [place];  Service: Plastics;  Laterality: Bilateral;   CARDIAC VALVE SURGERY  1968   CARPAL TUNNEL RELEASE Right 03/02/2020   Procedure: CARPAL TUNNEL RELEASE;  Surgeon: Carole Civil, MD;  Location: [facility];  Service: Orthopedics;  Laterality: Right;   CATARACT EXTRACTION W/PHACO Left 05/30/2019   Procedure: CATARACT EXTRACTION PHACO AND INTRAOCULAR LENS PLACEMENT (IOC)  (CDE: 4.94  );  Surgeon: Baruch Goldmann, MD;  Location: [facility];  Service: Ophthalmology;  Laterality: Left;   CATARACT EXTRACTION W/PHACO Right 06/16/2019   Procedure: CATARACT EXTRACTION PHACO AND INTRAOCULAR LENS PLACEMENT (IOC);  Surgeon: Baruch Goldmann, MD;  Location: [facility];  Service: Ophthalmology;  Laterality: Right;  CDE: 4.64   CERVICAL FUSION     CHOLECYSTECTOMY     COLONOSCOPY WITH PROPOFOL N/A 09/27/2016   Dr. Gala Romney: Diverticulosis, several tubular adenomas removed ranging 4 to 7 mm in size, internal grade 1 hemorrhoids, terminal ileum normal, segmental biopsies negative for microscopic colitis.  Next colonoscopy June 2021   COLONOSCOPY WITH PROPOFOL N/A 05/06/2020   internal hemorrhoids, sigmoid diverticulosis. Surveillance colonoscopy due in 2027   ESOPHAGOGASTRODUODENOSCOPY (EGD) WITH PROPOFOL N/A 09/27/2016   Dr. Gala Romney: Small hiatal hernia, mild Schatzki ring status post disruption, LA grade a esophagitis   ESOPHAGOGASTRODUODENOSCOPY (EGD) WITH PROPOFOL N/A 10/18/2020   Procedure: ESOPHAGOGASTRODUODENOSCOPY (EGD) WITH PROPOFOL;  Surgeon: Eloise Harman, DO;  [facility];  Service: Endoscopy;  Laterality: N/A;  8:45am   ILIAC ARTERY STENT Left 12/2007   KNEE ARTHROSCOPY WITH MEDIAL MENISECTOMY Right 01/10/2018   Procedure: RIGHT KNEE ARTHROSCOPY WITH PARTIAL MEDIAL MENISECTOMY;  Surgeon: Carole Civil, MD;  Location: [facility];  Service: Orthopedics;  Laterality: Right;   LEFT AND RIGHT HEART CATHETERIZATION WITH CORONARY ANGIOGRAM N/A 07/31/2014   Procedure: LEFT AND RIGHT HEART CATHETERIZATION WITH CORONARY ANGIOGRAM;  Surgeon: Burnell Blanks, MD;  Location: [facility] CATH LAB;  Service: Cardiovascular;  Laterality: N/A;   MALONEY DILATION N/A 09/27/2016   Procedure: Venia Minks DILATION;  Surgeon: Daneil Dolin, MD;  [facility];  Service: Endoscopy;  Laterality: N/A;   POLYPECTOMY  09/27/2016   Procedure: POLYPECTOMY;  Surgeon: Daneil Dolin, MD;   [facility];  Service: Endoscopy;;  colon   ROTATOR CUFF REPAIR Bilateral    TEE WITHOUT CARDIOVERSION N/A 07/07/2014   Procedure: TRANSESOPHAGEAL ECHOCARDIOGRAM (TEE);  Surgeon: Arnoldo Lenis, MD;  [facility];  Service: Cardiology;  Laterality: N/A;   TEE WITHOUT CARDIOVERSION N/A 07/20/2014   Procedure: TRANSESOPHAGEAL ECHOCARDIOGRAM (TEE) WITH PROPOFOL;  Surgeon: Arnoldo Lenis, MD;  Location: [facility];  Service: Endoscopy;  Laterality: N/A;   TEE WITHOUT CARDIOVERSION N/A 10/07/2014   Procedure: TRANSESOPHAGEAL ECHOCARDIOGRAM (TEE);  Surgeon: Rexene Alberts, MD;  Location: [place];  Service: Open Heart Surgery;  Laterality: N/A;    Allergies: Penicillins and Sulfa antibiotics  Medications: Prior to Admission medications   Medication Sig Start Date End Date Taking? Authorizing Provider  aspirin EC 81 MG tablet Take 1 tablet (81 mg total) by mouth daily. Swallow whole. 12/16/19   Arnoldo Lenis, MD  Cholecalciferol (VITAMIN D3) 5000 UNITS TABS Take 10,000 Units by mouth every morning.     [provider]  diclofenac (VOLTAREN) 75 MG EC tablet TAKE 1 TABLET TWICE A DAY (DO NOT CRUSH) Patient taking differently: Take 75 mg by mouth 2 (two) times daily. 02/16/20   Carole Civil, MD  dicyclomine (BENTYL) 20 MG tablet Take 1 tablet (20 mg total) by mouth 2 (two) times daily as needed for spasms. 09/26/20   Evalee Jefferson, PA-C  estradiol (ESTRACE) 2 MG tablet Take 1 tablet (2 mg total) by mouth daily. 09/02/20   Doree Albee, MD  fidaxomicin (DIFICID) 200 MG TABS tablet Take 1 tablet (200 mg total) by mouth 2 (two) times daily with a meal for 10 days. 10/18/20 10/28/20  Annitta Needs, NP  furosemide (LASIX) 40 MG tablet Take 1 tablet (40 mg total) by mouth daily as needed for edema. Patient taking differently: Take 40 mg by mouth daily as needed for fluid. 10/23/17 10/05/20  Arnoldo Lenis, MD  hydrocortisone (ANUSOL-HC) 2.5 % rectal cream Place 1  application rectally 2 (two) times daily. 10/15/20   Annitta Needs, NP  losartan (COZAAR) 25 MG tablet TAKE 1 TABLET DAILY Patient taking differently: Take 25 mg by mouth daily. 01/14/20   Arnoldo Lenis, MD  melatonin 5 MG TABS Take 5 mg by mouth at bedtime as needed (sleep).    [provider]  metoprolol tartrate (LOPRESSOR) 25 MG tablet Take 1 tablet (25 mg total) by mouth 2 (two) times daily. 04/05/20   Arnoldo Lenis, MD  Multiple Vitamin (MULTIVITAMIN WITH MINERALS) TABS tablet Take 1 tablet by mouth daily.    [provider]  NP THYROID 120 MG tablet Take 1 tablet (120 mg total) by mouth in the morning and at bedtime. 09/02/20   Doree Albee, MD  pantoprazole (PROTONIX) 40 MG tablet Take 1 tablet (40 mg total) by mouth 2 (two) times daily. 09/19/20 10/19/20  Doree Albee, MD  progesterone (PROMETRIUM) 200 MG capsule Take 2 capsules (400 mg total) by mouth daily. 06/14/20 10/05/20  Doree Albee, MD     Family History  Problem Relation Age of Onset   Hypertension Mother    Hypertension Father    Heart disease Father        before age 63   Other Father        varicose veins   COPD Father    Colon cancer Neg Hx    Celiac disease Neg Hx    Inflammatory bowel disease Neg Hx     Social History   Socioeconomic History   Marital status: Married    Spouse name: Not on file   Number of children: 0   Years of education: some coll   Highest education level: Not on file  Occupational History   Occupation: call center    Employer: [employer]    Comment: [employer]  Tobacco Use   Smoking status: Every Day    Packs/day: 0.50    Years: 40.00    Pack years: 20.00    Types: Cigarettes   Smokeless tobacco: Never  Vaping Use   Vaping Use: Never used  Substance and Sexual Activity   Alcohol use: Yes    Alcohol/week: 2.0 standard drinks    Types: 2 Cans of beer per week   Drug use: No   Sexual activity: Yes    Birth control/protection: None  Other Topics  Concern   Not on file  Social History Narrative   Patient is married   for 5 years . Patient works at [employer].. Some college education-did not Writer.Originally from [place].   Social Determinants of Health   Financial Resource Strain: Not on file  Food Insecurity: Not on file  Transportation Needs: Not on file  Physical Activity: Not on file  Stress: Not on file  Social Connections: Not on file   Review of Systems: A 12 point ROS discussed and pertinent positives are indicated in the HPI above.  All other systems are negative.  Review of Systems  Vital Signs: There were no vitals taken for this visit.  No physical examination performed in lieu of telephone visit.  Imaging: CT Angio Abd/Pel w/ and/or w/o  Result Date: 10/21/2020 CLINICAL DATA:  Chronic mesenteric ischemia Left abdominal pain EXAM: CTA ABDOMEN AND PELVIS WITHOUT AND WITH CONTRAST TECHNIQUE: Multidetector CT imaging of the abdomen and pelvis was performed using the standard protocol during bolus administration of intravenous contrast. Multiplanar reconstructed images and MIPs were obtained and reviewed to evaluate the vascular anatomy. CONTRAST:  177mL OMNIPAQUE IOHEXOL 350 MG/ML SOLN COMPARISON:  CT abdomen pelvis 09/22/2020 FINDINGS: VASCULAR Aorta: Minimal scattered atheromatous plaque of the infrarenal abdominal aorta without aneurysm or flow-limiting stenosis. Celiac: Severe focal stenosis of the proximal celiac artery. Celiac artery is patent beyond this status stenosis. SMA: Severe long segment stenosis of the proximal super mesenteric artery. The superior mesenteric artery is patent beyond this segment of stenosis. Renals: Both renal arteries are patent without evidence of aneurysm, dissection, vasculitis, fibromuscular dysplasia or significant stenosis. IMA: Patent. Enlarged. The enlarged collateral flowing from IMA to SMA, indicating that the SMA stenosis is likely chronic. Inflow: No  significant stenosis of the right common, internal, or external iliac arteries. No significant stenosis of the left common or internal iliac arteries. Left external iliac artery stent is patent with mild intimal hyperplasia within the stented segments. Proximal Outflow: Bilateral common femoral and visualized portions of the superficial and profunda femoral arteries are patent without evidence of aneurysm, dissection, vasculitis or significant stenosis. Veins: Hepatic, portal, superior mesenteric, splenic veins are patent. Review of the MIP images confirms the above findings. NON-VASCULAR Lower chest: No acute abnormality. Hepatobiliary: No focal liver abnormality is seen. Status post cholecystectomy. No biliary dilatation. Pancreas: Unremarkable. No pancreatic ductal dilatation or surrounding inflammatory changes. Spleen: Normal in size without focal abnormality. Adrenals/Urinary Tract: Adrenal glands are normal in appearance. 9 mm simple cyst seen in the anterior cortex of the left kidney. Kidneys and ureters otherwise normal. No significant abnormality of the bladder. Stomach/Bowel: Stomach is within normal limits. Appendix appears normal. No evidence of bowel wall thickening, distention, or inflammatory changes. Lymphatic: No enlarged abdominal or pelvic lymph nodes. Reproductive: Lobulated enlarged partially calcified uterus consistent with fibroids. Other: No abdominal wall hernia or abnormality. No abdominopelvic ascites. Musculoskeletal: No acute or significant osseous findings. IMPRESSION: VASCULAR 1. Severe focal stenosis of the cephalic artery. 2. Severe long segment stenosis of the proximal superior mesenteric artery. Prominent collateral branch noted originating from enlarged inferior mesenteric artery and supplying superior mesenteric artery, indicating that the SMA stenosis is likely chronic. NON-VASCULAR No acute abnormality of the abdominal organs. Fibroid uterus again seen. Electronically Signed    By: Miachel Roux M.D.   On: 10/21/2020 14:56    Labs:  CBC: Recent Labs    03/01/20 0833 09/22/20 1637 09/26/20 1423  WBC 7.2 7.3 5.9  HGB 13.3 12.0 12.5  HCT 40.7 36.1 37.0  PLT 255 275 263    COAGS: No results for input(s): INR, APTT in the last 8760 hours.  BMP: Recent Labs    03/01/20 0833 05/05/20 0823 09/01/20 1640 09/22/20 1637 09/26/20 1423  NA 137 137 138 138 137  K 4.3 4.3 4.2 4.2 4.3  CL 102 101  105 105 107  CO2 25 27 24 25 24   GLUCOSE 91 114* 90 101 126*  BUN 17 13 13 15 16   CALCIUM 9.5 9.6 9.4 9.0 9.2  CREATININE 0.94 0.83 0.79 0.77 1.05*  GFRNONAA >60 >60  --  82 60*  GFRAA  --   --   --  95  --     LIVER FUNCTION TESTS: Recent Labs    09/22/20 1637 09/26/20 1423 10/05/20 1550  BILITOT 0.3 0.6 0.3  AST 17 45* 19  ALT 18 46* 20  ALKPHOS  --  55  --   PROT 6.4 6.8 6.4  ALBUMIN  --  3.6  --     TUMOR MARKERS: No results for input(s): AFPTM, CEA, CA199, CHROMGRNA in the last 8760 hours.  Assessment and Plan:  65 year old female with chronic mesenteric ischemia secondary to severe, 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  Given the prominent Arc of [place], recanalization and stenting of the SMA alone should significantly improve symptoms.  We spoke about risk factors for atherosclerotic disease and emphasized smoking cessation.  She would like to try Chantix again after her abdominal pain improves.   We also discussed adding Plavix to her antiplatelet therapy after stent placement.  Plan for mesenteric angiogram with possible SMA stent placement at [facility].    Thank you for this interesting consult.  I greatly enjoyed meeting Catherin Doorn and look forward to participating in their care.  A copy of this report was sent to the requesting provider on this date.  Electronically Signed: Suzette Battiest, MD 10/26/2020, 9:20 AM   I spent a total of  60 Minutes  in virtual clinical consultation, greater  than 50% of which was counseling/coordinating care for chronic mesenteric ischemia.

## 2020-10-27 ENCOUNTER — Telehealth: Payer: Self-pay | Admitting: Student

## 2020-10-27 ENCOUNTER — Other Ambulatory Visit: Payer: Self-pay | Admitting: Radiology

## 2020-10-27 ENCOUNTER — Emergency Department (HOSPITAL_COMMUNITY)
Admission: EM | Admit: 2020-10-27 | Discharge: 2020-10-27 | Disposition: A | Discharge status: Home / Self Care | Payer: BC Managed Care – PPO | Attending: Emergency Medicine | Admitting: Emergency Medicine

## 2020-10-27 ENCOUNTER — Telehealth: Payer: Self-pay | Admitting: *Deleted

## 2020-10-27 DIAGNOSIS — Z7982 Long term (current) use of aspirin: Secondary | ICD-10-CM | POA: Insufficient documentation

## 2020-10-27 DIAGNOSIS — I110 Hypertensive heart disease with heart failure: Secondary | ICD-10-CM | POA: Insufficient documentation

## 2020-10-27 DIAGNOSIS — Z79899 Other long term (current) drug therapy: Secondary | ICD-10-CM | POA: Insufficient documentation

## 2020-10-27 DIAGNOSIS — I5022 Chronic systolic (congestive) heart failure: Secondary | ICD-10-CM | POA: Insufficient documentation

## 2020-10-27 DIAGNOSIS — J449 Chronic obstructive pulmonary disease, unspecified: Secondary | ICD-10-CM | POA: Diagnosis not present

## 2020-10-27 DIAGNOSIS — R1012 Left upper quadrant pain: Secondary | ICD-10-CM | POA: Diagnosis present

## 2020-10-27 DIAGNOSIS — R110 Nausea: Secondary | ICD-10-CM | POA: Insufficient documentation

## 2020-10-27 DIAGNOSIS — F17210 Nicotine dependence, cigarettes, uncomplicated: Secondary | ICD-10-CM | POA: Insufficient documentation

## 2020-10-27 LAB — URINALYSIS, ROUTINE W REFLEX MICROSCOPIC
Bilirubin Urine: NEGATIVE
Glucose, UA: NEGATIVE mg/dL
Hgb urine dipstick: NEGATIVE
Ketones, ur: NEGATIVE mg/dL
Leukocytes,Ua: NEGATIVE
Nitrite: NEGATIVE
Protein, ur: NEGATIVE mg/dL
Specific Gravity, Urine: 1.005 (ref 1.005–1.030)
pH: 6 (ref 5.0–8.0)

## 2020-10-27 LAB — CBC WITH DIFFERENTIAL/PLATELET
Abs Immature Granulocytes: 0.02 10*3/uL (ref 0.00–0.07)
Basophils Absolute: 0 10*3/uL (ref 0.0–0.1)
Basophils Relative: 0 %
Eosinophils Absolute: 0.3 10*3/uL (ref 0.0–0.5)
Eosinophils Relative: 4 %
HCT: 39.8 % (ref 36.0–46.0)
Hemoglobin: 13.2 g/dL (ref 12.0–15.0)
Immature Granulocytes: 0 %
Lymphocytes Relative: 33 %
Lymphs Abs: 2.3 10*3/uL (ref 0.7–4.0)
MCH: 29.1 pg (ref 26.0–34.0)
MCHC: 33.2 g/dL (ref 30.0–36.0)
MCV: 87.9 fL (ref 80.0–100.0)
Monocytes Absolute: 0.7 10*3/uL (ref 0.1–1.0)
Monocytes Relative: 10 %
Neutro Abs: 3.7 10*3/uL (ref 1.7–7.7)
Neutrophils Relative %: 53 %
Platelets: 251 10*3/uL (ref 150–400)
RBC: 4.53 MIL/uL (ref 3.87–5.11)
RDW: 11.8 % (ref 11.5–15.5)
WBC: 7 10*3/uL (ref 4.0–10.5)
nRBC: 0 % (ref 0.0–0.2)

## 2020-10-27 LAB — COMPREHENSIVE METABOLIC PANEL
ALT: 26 U/L (ref 0–44)
AST: 26 U/L (ref 15–41)
Albumin: 3.6 g/dL (ref 3.5–5.0)
Alkaline Phosphatase: 63 U/L (ref 38–126)
Anion gap: 9 (ref 5–15)
BUN: 10 mg/dL (ref 8–23)
CO2: 23 mmol/L (ref 22–32)
Calcium: 9.5 mg/dL (ref 8.9–10.3)
Chloride: 106 mmol/L (ref 98–111)
Creatinine, Ser: 0.69 mg/dL (ref 0.44–1.00)
GFR, Estimated: >60 mL/min (ref >60)
Glucose, Bld: 104 mg/dL — ABNORMAL HIGH (ref 70–99)
Potassium: 4.1 mmol/L (ref 3.5–5.1)
Sodium: 138 mmol/L (ref 135–145)
Total Bilirubin: 0.7 mg/dL (ref 0.3–1.2)
Total Protein: 6.7 g/dL (ref 6.5–8.1)

## 2020-10-27 LAB — LACTIC ACID, PLASMA
Lactic Acid, Venous: 1.2 mmol/L (ref 0.5–1.9)
Lactic Acid, Venous: 0.9 mmol/L (ref 0.5–1.9)

## 2020-10-27 LAB — LIPASE, BLOOD: Lipase: 42 U/L (ref 11–51)

## 2020-10-27 MED ORDER — SODIUM CHLORIDE 0.9 % IV BOLUS: 1000.0000 mL | Freq: Once | INTRAVENOUS | Status: DC

## 2020-10-27 MED ORDER — HYDROMORPHONE HCL 1 MG/ML IJ SOLN
1.0000 mg | Freq: Once | INTRAMUSCULAR | Status: AC
  Administered 2020-10-27: 1 mg via INTRAVENOUS
  Filled 2020-10-27: qty 1

## 2020-10-27 MED ORDER — SODIUM CHLORIDE 0.9 % IV BOLUS
1000.0000 mL | Freq: Once | INTRAVENOUS | Status: AC
  Administered 2020-10-27: 1000 mL via INTRAVENOUS

## 2020-10-27 NOTE — ED Provider Notes (Signed)
Emergency Medicine Provider Triage Evaluation Note  Michelle Patton , a 65 y.o. female  was evaluated in triage.  Pt complains of upper left quadrant abdominal pain.  Patient has been having intermittently over the last month.  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  Patient reports that she is due to be stented this Friday.  Patient reports that pain is more intense since last night.  Pain has gotten progressively worse.  Patient reports that pain is worse with eating.  Patient endorses nausea and urinary frequency.  Patient denies any fevers, chills, vomiting, dysuria, hematuria, vaginal pain, vaginal bleeding, vaginal discharge.  Review of Systems  Positive: Abdominal pain, nausea, urinary frequency Negative: evers, chills, vomiting, dysuria, hematuria, vaginal pain, vaginal bleeding, vaginal discharge.  Physical Exam  BP (!) 145/71   Pulse 71   Temp (!) 97.4 F (36.3 C)   Resp 20   SpO2 100%  Gen:   Awake, no distress   Resp:  Normal effort  MSK:   Moves extremities without difficulty  Other:  Abdomen soft, nondistended, tenderness to left upper quadrant and left lower quadrant.  Medical Decision Making  Medically screening exam initiated at 12:33 PM.  Appropriate orders placed.  Kevina Piloto was informed that the remainder of the evaluation will be completed by another provider, this initial triage assessment does not replace that evaluation, and the importance of remaining in the ED until their evaluation is complete.  The patient appears stable so that the remainder of the work up may be completed by another provider.      Loni Beckwith, PA-C 10/27/20 1236    Charlesetta Shanks, MD 10/28/20 [phone]

## 2020-10-27 NOTE — Progress Notes (Signed)
error 

## 2020-10-27 NOTE — Telephone Encounter (Signed)
Pt called in. Said she is on way to [facility] ER.  Real bad sharp pain in left side and in lower abdomen and nausea.  Pain level at a 7.  Had virtual ov yesterday and stent scheduled for this Friday.  She also called Dr. Lance Bosch office and left a message that she is headed to ER.  Routing as FYI.

## 2020-10-27 NOTE — Telephone Encounter (Signed)
Noted. I am very glad she is headed there. Appears IR PA-C is aware as well.

## 2020-10-27 NOTE — ED Triage Notes (Signed)
Pt here from home with c/o left upper abd pain radiates across  her abd , due to have drain placed this Friday in IR

## 2020-10-27 NOTE — Telephone Encounter (Signed)
Patient called to notify Interventional Radiology that she was en route to the ED for increased abdominal pain, referred by her GI MD.  Dr. Serafina Royals made aware in light of planned procedure 10/29/20.  Agree with further work-up in the setting of acute symptoms as she may need repeat CTA Abdomen Pelvis.  CBC, BMP, and lactate may also help delineate per the discretion of ED team.  IR remains available for consult if patient appropriate for hospitalist admission.   Brynda Greathouse, MS RD PA-C

## 2020-10-27 NOTE — ED Provider Notes (Signed)
[facility] EMERGENCY DEPARTMENT Provider Note   CSN: [id number] Arrival date & time: 10/27/20  1132     History No chief complaint on file.   Michelle Patton is a 65 y.o. female.  HPI  Patient presents with acute upper left abdominal quadrant pain.  Started last night has been constant since then.  States that he has been increasingly worse.  Associated with some nausea but no vomiting. Pain currently a 5/10.   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  Patient reports that she is due to be stented this Friday.  Contacted radiology and history of pain and they advised her to go to the ER for further work-up.  Note from her PA: Brynda Greathouse, MS RD PA-C Patient called to notify Interventional Radiology that she was en route to the ED for increased abdominal pain, referred by her GI MD. Dr. Serafina Royals made aware in light of planned procedure 10/29/20. Agree with further work-up in the setting of acute symptoms as she may need repeat CTA Abdomen Pelvis.  CBC, BMP, and lactate may also help delineate per the discretion of ED team.  IR remains available for consult if patient appropriate for hospitalist admission.  Past Medical History:  Diagnosis Date   Anxiety    Aortic regurgitation 07/20/2014   Aortic stenosis, severe 07/20/2014   Arthritis    DVT (deep venous thrombosis) (HCC)    Family history of adverse reaction to anesthesia    Reports father deliurm in his 52's with CABG   GERD (gastroesophageal reflux disease)    History of pneumonia    Hyperlipidemia    Hypertension    Insomnia, unspecified    Muscle weakness (generalized)    Peripheral artery disease (HCC)    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   Redo sternotomy for 21 mm Medtronic Freestyle porcine aortic root graft w/ reimplantation of left main and right coronary arteries   Sleep apnea     diagnosed multiple years ago at [facility] aortic stenosis, congenital - s/p repair during childhood     Patient Active Problem List   Diagnosis Date Noted   Elevated LFTs 10/05/2020   Class 2 severe obesity due to excess calories with serious comorbidity and body mass index (BMI) of 36.0 to 36.9 in adult [facility]) 08/09/2020   PAD (peripheral artery disease) ([place]) 08/09/2020   Aortic valve prosthesis present 97/98/9211   Chronic systolic congestive heart failure ([place]) 08/09/2020   OSA and COPD overlap syndrome ([place]) 08/09/2020   s/p right carpal tunnel release 03/02/20 04/06/2020   Closed displaced fracture of proximal phalanx of lesser toe of right foot 12/31/2019   Carpal tunnel syndrome of right wrist 12/05/2019   Nondisplaced fracture of fifth right metatarsal bone with routine healing 10/21/2019   Skin lesion 04/11/2019   Low back pain without sciatica 01/20/2019   Pain of upper abdomen 01/14/2019   Fatigue 12/09/2018   Breast cancer screening 12/09/2018   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 left wrist 10/23/2018   S/P right knee arthroscopy 01/10/18 01/18/2018   Chondromalacia, patella, right    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    Personal history of colonic polyps 12/28/2016   Abdominal pain, epigastric 08/10/2016   Left sided abdominal pain 08/10/2016   Esophageal dysphagia 08/10/2016   S/P redo aortic root replacement with stentless porcine aortic root  graft 10/07/2014   Supravalvular aortic stenosis, congenital - s/p repair during childhood    Essential hypertension    Hyperlipidemia    GERD (gastroesophageal reflux disease)    Aortic stenosis, severe 07/20/2014   Aortic regurgitation 07/20/2014   Leg cramps 09/26/2012   Occlusion and stenosis of carotid artery without mention of cerebral infarction 07/10/2012   Peripheral vascular disease, unspecified ([place]) 06/12/2012   Carotid artery bruit 06/12/2012    CONSTIPATION 12/28/2009   NAUSEA AND VOMITING 12/28/2009   DIARRHEA 12/28/2009    Past Surgical History:  Procedure Laterality Date   ABDOMINAL AORTAGRAM  06/24/2012   ABDOMINAL AORTAGRAM N/A 06/24/2012   Procedure: ABDOMINAL Maxcine Ham;  Surgeon: Angelia Mould, MD;  Location: [facility] CATH LAB;  Service: Cardiovascular;  Laterality: N/A;   AORTIC VALVE REPLACEMENT N/A 10/07/2014   Procedure: REDO AORTIC VALVE REPLACEMENT (AVR);  Surgeon: Rexene Alberts, MD;  Location: [place];  Service: Open Heart Surgery;  Laterality: N/A;   ASCENDING AORTIC ROOT REPLACEMENT N/A 10/07/2014   Procedure: ASCENDING AORTIC ROOT REPLACEMENT;  Surgeon: Rexene Alberts, MD;  Location: [place];  Service: Open Heart Surgery;  Laterality: N/A;   BIOPSY  09/27/2016   Procedure: BIOPSY;  Surgeon: Daneil Dolin, MD;  [facility];  Service: Endoscopy;;  colon   BIOPSY  10/18/2020   Procedure: BIOPSY;  Surgeon: Eloise Harman, DO;  [facility];  Service: Endoscopy;;   BREAST REDUCTION SURGERY Bilateral 01/21/2018   Procedure: BREAST REDUCTION WITH LIPOSUCTION;  Surgeon: Cristine Polio, MD;  Location: [facility];  Service: Plastics;  Laterality: Bilateral;   CARDIAC VALVE SURGERY  1968   CARPAL TUNNEL RELEASE Right 03/02/2020   Procedure: CARPAL TUNNEL RELEASE;  Surgeon: Carole Civil, MD;  Location: [facility];  Service: Orthopedics;  Laterality: Right;   CATARACT EXTRACTION W/PHACO Left 05/30/2019   Procedure: CATARACT EXTRACTION PHACO AND INTRAOCULAR LENS PLACEMENT (IOC) (CDE: 4.94  );  Surgeon: Baruch Goldmann, MD;  Location: [facility];  Service: Ophthalmology;  Laterality: Left;   CATARACT EXTRACTION W/PHACO Right 06/16/2019   Procedure: CATARACT EXTRACTION PHACO AND INTRAOCULAR LENS PLACEMENT (IOC);  Surgeon: Baruch Goldmann, MD;  Location: [facility];  Service: Ophthalmology;  Laterality: Right;  CDE: 4.64   CERVICAL FUSION     CHOLECYSTECTOMY     COLONOSCOPY WITH PROPOFOL  N/A 09/27/2016   Dr. Gala Romney: Diverticulosis, several tubular adenomas removed ranging 4 to 7 mm in size, internal grade 1 hemorrhoids, terminal ileum normal, segmental biopsies negative for microscopic colitis.  Next colonoscopy June 2021   COLONOSCOPY WITH PROPOFOL N/A 05/06/2020   internal hemorrhoids, sigmoid diverticulosis. Surveillance colonoscopy due in 2027   ESOPHAGOGASTRODUODENOSCOPY (EGD) WITH PROPOFOL N/A 09/27/2016   Dr. Gala Romney: Small hiatal hernia, mild Schatzki ring status post disruption, LA grade a esophagitis   ESOPHAGOGASTRODUODENOSCOPY (EGD) WITH PROPOFOL N/A 10/18/2020   Procedure: ESOPHAGOGASTRODUODENOSCOPY (EGD) WITH PROPOFOL;  Surgeon: Eloise Harman, DO;  [facility];  Service: Endoscopy;  Laterality: N/A;  8:45am   ILIAC ARTERY STENT Left 12/2007   IR RADIOLOGIST EVAL & MGMT  10/26/2020   KNEE ARTHROSCOPY WITH MEDIAL MENISECTOMY Right 01/10/2018   Procedure: RIGHT KNEE ARTHROSCOPY WITH PARTIAL MEDIAL MENISECTOMY;  Surgeon: Carole Civil, MD;  Location: [facility];  Service: Orthopedics;  Laterality: Right;   LEFT AND RIGHT HEART CATHETERIZATION WITH CORONARY ANGIOGRAM N/A 07/31/2014   Procedure: LEFT AND RIGHT HEART CATHETERIZATION WITH CORONARY ANGIOGRAM;  Surgeon: Burnell Blanks, MD;  Location: [facility]  CATH LAB;  Service: Cardiovascular;  Laterality: N/A;   MALONEY DILATION N/A 09/27/2016   Procedure: Venia Minks DILATION;  Surgeon: Daneil Dolin, MD;  [facility];  Service: Endoscopy;  Laterality: N/A;   POLYPECTOMY  09/27/2016   Procedure: POLYPECTOMY;  Surgeon: Daneil Dolin, MD;  [facility];  Service: Endoscopy;;  colon   ROTATOR CUFF REPAIR Bilateral    TEE WITHOUT CARDIOVERSION N/A 07/07/2014   Procedure: TRANSESOPHAGEAL ECHOCARDIOGRAM (TEE);  Surgeon: Arnoldo Lenis, MD;  [facility];  Service: Cardiology;  Laterality: N/A;   TEE WITHOUT CARDIOVERSION N/A 07/20/2014   Procedure: TRANSESOPHAGEAL  ECHOCARDIOGRAM (TEE) WITH PROPOFOL;  Surgeon: Arnoldo Lenis, MD;  Location: [facility];  Service: Endoscopy;  Laterality: N/A;   TEE WITHOUT CARDIOVERSION N/A 10/07/2014   Procedure: TRANSESOPHAGEAL ECHOCARDIOGRAM (TEE);  Surgeon: Rexene Alberts, MD;  Location: [place];  Service: Open Heart Surgery;  Laterality: N/A;     OB History   No obstetric history on file.     Family History  Problem Relation Age of Onset   Hypertension Mother    Hypertension Father    Heart disease Father        before age 63   Other Father        varicose veins   COPD Father    Colon cancer Neg Hx    Celiac disease Neg Hx    Inflammatory bowel disease Neg Hx     Social History   Tobacco Use   Smoking status: Every Day    Packs/day: 0.50    Years: 40.00    Pack years: 20.00    Types: Cigarettes   Smokeless tobacco: Never  Vaping Use   Vaping Use: Never used  Substance Use Topics   Alcohol use: Yes    Alcohol/week: 2.0 standard drinks    Types: 2 Cans of beer per week   Drug use: No    Home Medications Prior to Admission medications   Medication Sig Start Date End Date Taking? Authorizing Provider  aspirin EC 81 MG tablet Take 1 tablet (81 mg total) by mouth daily. Swallow whole. 12/16/19   Arnoldo Lenis, MD  CALCIUM MAGNESIUM ZINC PO Take 1 tablet by mouth daily.    [provider]  diclofenac (VOLTAREN) 75 MG EC tablet TAKE 1 TABLET TWICE A DAY (DO NOT CRUSH) Patient taking differently: Take 75 mg by mouth 2 (two) times daily. 02/16/20   Carole Civil, MD  dicyclomine (BENTYL) 20 MG tablet Take 1 tablet (20 mg total) by mouth 2 (two) times daily as needed for spasms. Patient not taking: No sig reported 09/26/20   Evalee Jefferson, PA-C  estradiol (ESTRACE) 2 MG tablet Take 1 tablet (2 mg total) by mouth daily. 09/02/20   Doree Albee, MD  fidaxomicin (DIFICID) 200 MG TABS tablet Take 1 tablet (200 mg total) by mouth 2 (two) times daily with a meal for 10 days. 10/18/20 10/28/20   Annitta Needs, NP  fidaxomicin (DIFICID) 200 MG TABS tablet Take 200 mg by mouth 2 (two) times daily.    [provider]  Flaxseed, Linseed, (FLAX SEEDS PO) Take 1 capsule by mouth daily.    [provider]  furosemide (LASIX) 40 MG tablet Take 1 tablet (40 mg total) by mouth daily as needed for edema. Patient taking differently: Take 40 mg by mouth daily as needed for fluid. 10/23/17 10/27/20  Arnoldo Lenis, MD  hydrocortisone (ANUSOL-HC) 2.5 %  rectal cream Place 1 application rectally 2 (two) times daily. Patient not taking: No sig reported 10/15/20   Annitta Needs, NP  losartan (COZAAR) 25 MG tablet TAKE 1 TABLET DAILY Patient taking differently: Take 25 mg by mouth daily. 01/14/20   Arnoldo Lenis, MD  metoprolol tartrate (LOPRESSOR) 25 MG tablet Take 1 tablet (25 mg total) by mouth 2 (two) times daily. 04/05/20   Arnoldo Lenis, MD  Multiple Vitamin (MULTIVITAMIN WITH MINERALS) TABS tablet Take 1 tablet by mouth daily.    [provider]  NP THYROID 120 MG tablet Take 1 tablet (120 mg total) by mouth in the morning and at bedtime. 09/02/20   Doree Albee, MD  pantoprazole (PROTONIX) 40 MG tablet Take 1 tablet (40 mg total) by mouth 2 (two) times daily. Patient not taking: Reported on 10/27/2020 09/19/20 10/27/20  Doree Albee, MD  progesterone (PROMETRIUM) 200 MG capsule Take 2 capsules (400 mg total) by mouth daily. 06/14/20 10/27/20  Doree Albee, MD  RABEprazole (ACIPHEX) 20 MG tablet Take 20 mg by mouth daily.    [provider]  VITAMIN D PO Take 1 capsule by mouth daily.    [provider]    Allergies    Penicillins and Sulfa antibiotics  Review of Systems   Review of Systems  Constitutional:  Negative for chills and fever.  HENT:  Negative for ear pain and sore throat.   Eyes:  Negative for pain and visual disturbance.  Respiratory:  Negative for cough and shortness of breath.   Cardiovascular:  Negative for chest  pain and palpitations.  Gastrointestinal:  Positive for abdominal pain and nausea. Negative for diarrhea and vomiting.  Genitourinary:  Negative for dysuria and hematuria.  Musculoskeletal:  Negative for arthralgias and back pain.  Skin:  Negative for color change and rash.  Neurological:  Negative for seizures and syncope.  All other systems reviewed and are negative.  Physical Exam Updated Vital Signs BP (!) 116/42   Pulse 69   Temp (!) 97.4 F (36.3 C)   Resp 15   SpO2 97%   Physical Exam Vitals and nursing note reviewed. Exam conducted with a chaperone present.  Constitutional:      Appearance: Normal appearance.  HENT:     Head: Normocephalic and atraumatic.  Eyes:     General: No scleral icterus.       Right eye: No discharge.        Left eye: No discharge.     Extraocular Movements: Extraocular movements intact.     Pupils: Pupils are equal, round, and reactive to light.  Cardiovascular:     Rate and Rhythm: Normal rate and regular rhythm.     Pulses: Normal pulses.     Heart sounds: Normal heart sounds. No murmur heard.   No friction rub. No gallop.  Pulmonary:     Effort: Pulmonary effort is normal. No respiratory distress.     Breath sounds: Normal breath sounds.  Abdominal:     General: Abdomen is flat. Bowel sounds are normal. There is no distension.     Palpations: Abdomen is soft.     Tenderness: There is abdominal tenderness. There is no guarding or rebound.     Comments: Tender to the left upper quadrant.  Skin:    General: Skin is warm and dry.     Coloration: Skin is not jaundiced.  Neurological:     Mental Status: She is alert. Mental status is  at baseline.     Coordination: Coordination normal.    ED Results / Procedures / Treatments   Labs (all labs ordered are listed, but only abnormal results are displayed) Labs Reviewed  COMPREHENSIVE METABOLIC PANEL - Abnormal; Notable for the following components:      Result Value   Glucose, Bld 104  (*)    All other components within normal limits  URINALYSIS, ROUTINE W REFLEX MICROSCOPIC - Abnormal; Notable for the following components:   APPearance HAZY (*)    All other components within normal limits  CBC WITH DIFFERENTIAL/PLATELET  LIPASE, BLOOD  LACTIC ACID, PLASMA  LACTIC ACID, PLASMA    EKG None  Radiology IR Radiologist Eval & Mgmt  Result Date: 10/26/2020 Please refer to notes tab for details about interventional procedure. (Op Note)   Procedures Procedures   Medications Ordered in ED Medications  HYDROmorphone (DILAUDID) injection 1 mg (1 mg Intravenous Given 10/27/20 1347)  sodium chloride 0.9 % bolus 1,000 mL (0 mLs Intravenous Stopped 10/27/20 1654)  HYDROmorphone (DILAUDID) injection 1 mg (1 mg Intravenous Given 10/27/20 1719)    ED Course  I have reviewed the triage vital signs and the nursing notes.  Pertinent labs & imaging results that were available during my care of the patient were reviewed by me and considered in my medical decision making (see chart for details).  Clinical Course as of 10/27/20 1745  Wed Oct 27, 2020  1705 Lactic Acid, Venous: 0.9 [HS]  1710 Pain returned. 7/10. Will order another dose of pain medicine  [HS]  1735 Pain improved.  [HS]  1736 Comprehensive metabolic panel(!) No electrolyte derangement, no AKI, elevated liver enzymes [HS]  1744 Lipase, blood Doubt pancreatitis [HS]  1744 CBC with Differential No anemia. No elevated WBC concerning for infection  [HS]  1745 Urinalysis, Routine w reflex microscopic Urine, Clean Catch(!) No UTI [HS]    Clinical Course User Index [HS] Sherrill Raring, PA-C   MDM Rules/Calculators/A&P                          Initiated work-up involving labs and lactic acid.  We will hold off on additional radiation while the lab results are still pending.  Given history, concern that there is occlusion of proximal superior mesenteric artery.   See ED course for lab results. Thus far, nothing acute.  Second lactic still pending.   Spoke with Dr. Serafina Royals from interventional radiology about the patient.  He is going to see the patient.  Based on the current work-up, he consider a second lactic acid does not come back increasing her pain remains low she is appropriate for discharge with surgery scheduled for Friday morning.  Further advice pending when he evaluates the patient.  Second lactic returned negative. Patient is nontoxic with stable vitals. Her pain is controlled. Discussed results with her and Dr. Serafina Royals and she is appropriate for discharge with strict return precautions. Patient voices understanding and agreement with the plan.   Final Clinical Impression(s) / ED Diagnoses Final diagnoses:  Left upper quadrant abdominal pain    Rx / DC Orders ED Discharge Orders     None        Sherrill Raring, PA-C 10/27/20 1745    Charlesetta Shanks, MD 10/28/20 [phone]

## 2020-10-27 NOTE — Discharge Instructions (Addendum)
You were seen today for abdominal pain.  Work-up is reassuring.  Please continue with Tylenol and ibuprofen as needed for pain.  Continue to drink fluids and prepare for your procedure on Friday.  If things change or worsen from how they were today, please return to the ED for further evaluation.  As discussed, we did not order repeat imaging in hopes to avoid extra radiation exposure today.  If things worsen before your procedure we may need to move ahead with that.

## 2020-10-27 NOTE — Consult Note (Signed)
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  She presents today after waking up with severely worsened abdominal pain.  The pain is primarily epigastric, radiating throughout her abdomen.  Oral intake remains low: last night she drank a ginger ale for dinner and this morning she ate a yogurt.  She had a small bowel movement this morning, reports no blood.  Has been nauseated throughout the day, no vomiting.  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   Redo sternotomy for 21 mm Medtronic Freestyle porcine aortic root graft w/ reimplantation of left main and right coronary arteries   Sleep apnea    diagnosed multiple years ago at [facility] aortic stenosis, congenital - s/p repair during childhood     Past Surgical History:  Procedure Laterality Date   ABDOMINAL AORTAGRAM  06/24/2012   ABDOMINAL AORTAGRAM N/A 06/24/2012   Procedure: ABDOMINAL Maxcine Ham;  Surgeon: Angelia Mould, MD;  Location: [facility] CATH LAB;  Service: Cardiovascular;  Laterality: N/A;   AORTIC VALVE REPLACEMENT N/A 10/07/2014   Procedure: REDO AORTIC VALVE REPLACEMENT (AVR);  Surgeon:  Rexene Alberts, MD;  Location: [place];  Service: Open Heart Surgery;  Laterality: N/A;   ASCENDING AORTIC ROOT REPLACEMENT N/A 10/07/2014   Procedure: ASCENDING AORTIC ROOT REPLACEMENT;  Surgeon: Rexene Alberts, MD;  Location: [place];  Service: Open Heart Surgery;  Laterality: N/A;   BIOPSY  09/27/2016   Procedure: BIOPSY;  Surgeon: Daneil Dolin, MD;  [facility];  Service: Endoscopy;;  colon   BIOPSY  10/18/2020   Procedure: BIOPSY;  Surgeon: Eloise Harman, DO;  [facility];  Service: Endoscopy;;   BREAST REDUCTION SURGERY Bilateral 01/21/2018   Procedure: BREAST REDUCTION WITH LIPOSUCTION;  Surgeon: Cristine Polio, MD;  Location: [place];  Service: Plastics;  Laterality: Bilateral;   CARDIAC VALVE SURGERY  1968   CARPAL TUNNEL RELEASE Right 03/02/2020   Procedure: CARPAL TUNNEL RELEASE;  Surgeon: Carole Civil, MD;  Location: [facility];  Service: Orthopedics;  Laterality: Right;   CATARACT EXTRACTION W/PHACO Left 05/30/2019   Procedure: CATARACT EXTRACTION PHACO AND INTRAOCULAR LENS PLACEMENT (IOC) (CDE: 4.94  );  Surgeon: Baruch Goldmann, MD;  Location: [facility];  Service: Ophthalmology;  Laterality: Left;   CATARACT EXTRACTION W/PHACO Right 06/16/2019   Procedure: CATARACT EXTRACTION PHACO AND INTRAOCULAR LENS PLACEMENT (IOC);  Surgeon: Baruch Goldmann, MD;  Location: [facility];  Service: Ophthalmology;  Laterality: Right;  CDE: 4.64   CERVICAL FUSION     CHOLECYSTECTOMY     COLONOSCOPY WITH PROPOFOL N/A 09/27/2016   Dr. Gala Romney: Diverticulosis,  several tubular adenomas removed ranging 4 to 7 mm in size, internal grade 1 hemorrhoids, terminal ileum normal, segmental biopsies negative for microscopic colitis.  Next colonoscopy June 2021   COLONOSCOPY WITH PROPOFOL N/A 05/06/2020   internal hemorrhoids, sigmoid diverticulosis. Surveillance colonoscopy due in 2027   ESOPHAGOGASTRODUODENOSCOPY (EGD) WITH PROPOFOL N/A 09/27/2016   Dr. Gala Romney: Small  hiatal hernia, mild Schatzki ring status post disruption, LA grade a esophagitis   ESOPHAGOGASTRODUODENOSCOPY (EGD) WITH PROPOFOL N/A 10/18/2020   Procedure: ESOPHAGOGASTRODUODENOSCOPY (EGD) WITH PROPOFOL;  Surgeon: Eloise Harman, DO;  [facility];  Service: Endoscopy;  Laterality: N/A;  8:45am   ILIAC ARTERY STENT Left 12/2007   IR RADIOLOGIST EVAL & MGMT  10/26/2020   KNEE ARTHROSCOPY WITH MEDIAL MENISECTOMY Right 01/10/2018   Procedure: RIGHT KNEE ARTHROSCOPY WITH PARTIAL MEDIAL MENISECTOMY;  Surgeon: Carole Civil, MD;  Location[facility];  Service: Orthopedics;  Laterality: Right;   LEFT AND RIGHT HEART CATHETERIZATION WITH CORONARY ANGIOGRAM N/A 07/31/2014   Procedure: LEFT AND RIGHT HEART CATHETERIZATION WITH CORONARY ANGIOGRAM;  Surgeon: Burnell Blanks, MD;  Location[facility] CATH LAB;  Service: Cardiovascular;  Laterality: N/A;   MALONEY DILATION N/A 09/27/2016   Procedure: Venia Minks DILATION;  Surgeon: Daneil Dolin, MD;  [facility];  Service: Endoscopy;  Laterality: N/A;   POLYPECTOMY  09/27/2016   Procedure: POLYPECTOMY;  Surgeon: Daneil Dolin, MD;  [facility];  Service: Endoscopy;;  colon   ROTATOR CUFF REPAIR Bilateral    TEE WITHOUT CARDIOVERSION N/A 07/07/2014   Procedure: TRANSESOPHAGEAL ECHOCARDIOGRAM (TEE);  Surgeon: Arnoldo Lenis, MD;  [facility];  Service: Cardiology;  Laterality: N/A;   TEE WITHOUT CARDIOVERSION N/A 07/20/2014   Procedure: TRANSESOPHAGEAL ECHOCARDIOGRAM (TEE) WITH PROPOFOL;  Surgeon: Arnoldo Lenis, MD;  Location: [facility];  Service: Endoscopy;  Laterality: N/A;   TEE WITHOUT CARDIOVERSION N/A 10/07/2014   Procedure: TRANSESOPHAGEAL ECHOCARDIOGRAM (TEE);  Surgeon: Rexene Alberts, MD;  Location: [place];  Service: Open Heart Surgery;  Laterality: N/A;    Allergies: Penicillins and Sulfa antibiotics  Medications: Prior to Admission medications   Medication Sig Start Date End Date Taking?  Authorizing Provider  aspirin EC 81 MG tablet Take 1 tablet (81 mg total) by mouth daily. Swallow whole. 12/16/19   Arnoldo Lenis, MD  CALCIUM MAGNESIUM ZINC PO Take 1 tablet by mouth daily.    [provider]  diclofenac (VOLTAREN) 75 MG EC tablet TAKE 1 TABLET TWICE A DAY (DO NOT CRUSH) Patient taking differently: Take 75 mg by mouth 2 (two) times daily. 02/16/20   Carole Civil, MD  dicyclomine (BENTYL) 20 MG tablet Take 1 tablet (20 mg total) by mouth 2 (two) times daily as needed for spasms. Patient not taking: No sig reported 09/26/20   Evalee Jefferson, PA-C  estradiol (ESTRACE) 2 MG tablet Take 1 tablet (2 mg total) by mouth daily. 09/02/20   Doree Albee, MD  fidaxomicin (DIFICID) 200 MG TABS tablet Take 1 tablet (200 mg total) by mouth 2 (two) times daily with a meal for 10 days. 10/18/20 10/28/20  Annitta Needs, NP  fidaxomicin (DIFICID) 200 MG TABS tablet Take 200 mg by mouth 2 (two) times daily.    [provider]  Flaxseed, Linseed, (FLAX SEEDS PO) Take 1 capsule by mouth daily.    [provider]  furosemide (LASIX) 40 MG tablet Take 1 tablet (40 mg total) by mouth daily as needed for edema. Patient  taking differently: Take 40 mg by mouth daily as needed for fluid. 10/23/17 10/27/20  Arnoldo Lenis, MD  hydrocortisone (ANUSOL-HC) 2.5 % rectal cream Place 1 application rectally 2 (two) times daily. Patient not taking: No sig reported 10/15/20   Annitta Needs, NP  losartan (COZAAR) 25 MG tablet TAKE 1 TABLET DAILY Patient taking differently: Take 25 mg by mouth daily. 01/14/20   Arnoldo Lenis, MD  metoprolol tartrate (LOPRESSOR) 25 MG tablet Take 1 tablet (25 mg total) by mouth 2 (two) times daily. 04/05/20   Arnoldo Lenis, MD  Multiple Vitamin (MULTIVITAMIN WITH MINERALS) TABS tablet Take 1 tablet by mouth daily.    [provider]  NP THYROID 120 MG tablet Take 1 tablet (120 mg total) by mouth in the morning and at bedtime. 09/02/20    Doree Albee, MD  pantoprazole (PROTONIX) 40 MG tablet Take 1 tablet (40 mg total) by mouth 2 (two) times daily. Patient not taking: Reported on 10/27/2020 09/19/20 10/27/20  Doree Albee, MD  progesterone (PROMETRIUM) 200 MG capsule Take 2 capsules (400 mg total) by mouth daily. 06/14/20 10/27/20  Doree Albee, MD  RABEprazole (ACIPHEX) 20 MG tablet Take 20 mg by mouth daily.    [provider]  VITAMIN D PO Take 1 capsule by mouth daily.    [provider]     Family History  Problem Relation Age of Onset   Hypertension Mother    Hypertension Father    Heart disease Father        before age 26   Other Father        varicose veins   COPD Father    Colon cancer Neg Hx    Celiac disease Neg Hx    Inflammatory bowel disease Neg Hx     Social History   Socioeconomic History   Marital status: Married    Spouse name: Not on file   Number of children: 0   Years of education: some coll   Highest education level: Not on file  Occupational History   Occupation: call center    Employer: [employer]    Comment: [employer]  Tobacco Use   Smoking status: Every Day    Packs/day: 0.50    Years: 40.00    Pack years: 20.00    Types: Cigarettes   Smokeless tobacco: Never  Vaping Use   Vaping Use: Never used  Substance and Sexual Activity   Alcohol use: Yes    Alcohol/week: 2.0 standard drinks    Types: 2 Cans of beer per week   Drug use: No   Sexual activity: Yes    Birth control/protection: None  Other Topics Concern   Not on file  Social History Narrative   Patient is married   for 5 years . Patient works at [employer].. Some college education-did not Writer.Originally from [place].   Social Determinants of Health   Financial Resource Strain: Not on file  Food Insecurity: Not on file  Transportation Needs: Not on file  Physical Activity: Not on file  Stress: Not on file  Social Connections: Not on file    Review of Systems: A 12  point ROS discussed and pertinent positives are indicated in the HPI above.  All other systems are negative.   Vital Signs: BP (!) 114/47   Pulse 76   Temp (!) 97.4 F (36.3 C)   Resp 20   SpO2 98%   Physical Exam HENT:  Head: Normocephalic.     Mouth/Throat:     Mouth: Mucous membranes are moist.  Eyes:     General: No scleral icterus. Cardiovascular:     Rate and Rhythm: Normal rate and regular rhythm.  Pulmonary:     Effort: Pulmonary effort is normal.     Breath sounds: Normal breath sounds.  Abdominal:     General: There is no distension.     Tenderness: There is abdominal tenderness. There is guarding.  Skin:    General: Skin is warm and dry.  Neurological:     Mental Status: She is alert and oriented to person, place, and time.    Imaging: IR Radiologist Eval & Mgmt  Result Date: 10/26/2020 Please refer to notes tab for details about interventional procedure. (Op Note)  CT Angio Abd/Pel w/ and/or w/o  Result Date: 10/21/2020 CLINICAL DATA:  Chronic mesenteric ischemia Left abdominal pain EXAM: CTA ABDOMEN AND PELVIS WITHOUT AND WITH CONTRAST TECHNIQUE: Multidetector CT imaging of the abdomen and pelvis was performed using the standard protocol during bolus administration of intravenous contrast. Multiplanar reconstructed images and MIPs were obtained and reviewed to evaluate the vascular anatomy. CONTRAST:  17mL OMNIPAQUE IOHEXOL 350 MG/ML SOLN COMPARISON:  CT abdomen pelvis 09/22/2020 FINDINGS: VASCULAR Aorta: Minimal scattered atheromatous plaque of the infrarenal abdominal aorta without aneurysm or flow-limiting stenosis. Celiac: Severe focal stenosis of the proximal celiac artery. Celiac artery is patent beyond this status stenosis. SMA: Severe long segment stenosis of the proximal super mesenteric artery. The superior mesenteric artery is patent beyond this segment of stenosis. Renals: Both renal arteries are patent without evidence of aneurysm, dissection,  vasculitis, fibromuscular dysplasia or significant stenosis. IMA: Patent. Enlarged. The enlarged collateral flowing from IMA to SMA, indicating that the SMA stenosis is likely chronic. Inflow: No significant stenosis of the right common, internal, or external iliac arteries. No significant stenosis of the left common or internal iliac arteries. Left external iliac artery stent is patent with mild intimal hyperplasia within the stented segments. Proximal Outflow: Bilateral common femoral and visualized portions of the superficial and profunda femoral arteries are patent without evidence of aneurysm, dissection, vasculitis or significant stenosis. Veins: Hepatic, portal, superior mesenteric, splenic veins are patent. Review of the MIP images confirms the above findings. NON-VASCULAR Lower chest: No acute abnormality. Hepatobiliary: No focal liver abnormality is seen. Status post cholecystectomy. No biliary dilatation. Pancreas: Unremarkable. No pancreatic ductal dilatation or surrounding inflammatory changes. Spleen: Normal in size without focal abnormality. Adrenals/Urinary Tract: Adrenal glands are normal in appearance. 9 mm simple cyst seen in the anterior cortex of the left kidney. Kidneys and ureters otherwise normal. No significant abnormality of the bladder. Stomach/Bowel: Stomach is within normal limits. Appendix appears normal. No evidence of bowel wall thickening, distention, or inflammatory changes. Lymphatic: No enlarged abdominal or pelvic lymph nodes. Reproductive: Lobulated enlarged partially calcified uterus consistent with fibroids. Other: No abdominal wall hernia or abnormality. No abdominopelvic ascites. Musculoskeletal: No acute or significant osseous findings. IMPRESSION: VASCULAR 1. Severe focal stenosis of the cephalic artery. 2. Severe long segment stenosis of the proximal superior mesenteric artery. Prominent collateral branch noted originating from enlarged inferior mesenteric artery and  supplying superior mesenteric artery, indicating that the SMA stenosis is likely chronic. NON-VASCULAR No acute abnormality of the abdominal organs. Fibroid uterus again seen. Electronically Signed   By: Miachel Roux M.D.   On: 10/21/2020 14:56    Labs:  CBC: Recent Labs    03/01/20 1610 09/22/20 1637 09/26/20 1423 10/27/20  1234  WBC 7.2 7.3 5.9 7.0  HGB 13.3 12.0 12.5 13.2  HCT 40.7 36.1 37.0 39.8  PLT 255 275 263 251    COAGS: No results for input(s): INR, APTT in the last 8760 hours.  BMP: Recent Labs    05/05/20 0823 09/01/20 1640 09/22/20 1637 09/26/20 1423 10/27/20 1234  NA 137 138 138 137 138  K 4.3 4.2 4.2 4.3 4.1  CL 101 105 105 107 106  CO2 27 24 25 24 23   GLUCOSE 114* 90 101 126* 104*  BUN 13 13 15 16 10   CALCIUM 9.6 9.4 9.0 9.2 9.5  CREATININE 0.83 0.79 0.77 1.05* 0.69  GFRNONAA >60  --  82 60* >60  GFRAA  --   --  95  --   --     LIVER FUNCTION TESTS: Recent Labs    09/22/20 1637 09/26/20 1423 10/05/20 1550 10/27/20 1234  BILITOT 0.3 0.6 0.3 0.7  AST 17 45* 19 26  ALT 18 46* 20 26  ALKPHOS  --  55  --  63  PROT 6.4 6.8 6.4 6.7  ALBUMIN  --  3.6  --  3.6   Lactate:  1.2  Assessment and Plan: 65 year old female with chronic mesenteric ischemia secondary to severe, 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  Presents with worsening abdominal pain.  No clinical evidence of acute on chronic mesenteric ischemia.  Likely exacerbation of chronic symptoms.    Agree with repeat lactate.  If increasing, would recommend admission with initiation of heparin infusion and repeat CTA abdomen and pelvis to assess for worsening ischemia.  If this is the case, please page IR MD on call. Otherwise if lactate stable and she remains clinically stable, OK to discharge with return on Friday morning for planned SMA stent procedure.    Please reach out to IR team with any questions or concerns.    Electronically Signed: Suzette Battiest, MD 10/27/2020, 3:56 PM   I spent a total of 40 Minutes  in face to face in clinical consultation, greater than 50% of which was counseling/coordinating care for acute abdominal pain.

## 2020-10-28 NOTE — H&P (Addendum)
Chief Complaint: Patient was seen in consultation today for mesenteric angiogram with possible SMA stent placement at the request of Daneil Dolin, MD  Referring Physician(s): Daneil Dolin, MD  Supervising Physician: Ruthann Cancer  Patient Status: [facility] - Out-pt  History of Present Illness: Michelle Patton is a 65 y.o. 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 Patient had a consultation visit with Dr. Serafina Royals on 10/26/2020, she was deemed a good candidate for mesenteric arteriogram with possible SMA stent placement.  After the consultation she was scheduled for the mesenteric arteriogram with possible SMA stent placement at [facility] IR on 10/28/2020, patient presented to [facility] ED on 10/27/2020 due to LUQ abdominal pain.  In ED, vital signs were stable, CBC was all within normal limit , CMP showed mild elevation in glucose, normal lipase and lactic acid.   Patient was evaluated by EDPs and Dr. Serafina Royals, ultimately discharged home with plan for returning to [facility] IR on 10/28/2020 for the mesenteric arteriogram with possible SMA stent placement. Patient presents to [facility] IR today for the procedure.  Patient laying in bed, not in acute distress.  Reports chronic abdominal pain, not worse than usual. Denise headache, fever, chills, shortness of breath, cough, chest pain, nausea ,vomiting, and bleeding.   Past Medical History:  Diagnosis Date   Anxiety    Aortic regurgitation 07/20/2014   Aortic stenosis, severe 07/20/2014   Arthritis    DVT (deep venous thrombosis) (HCC)    Family history of adverse reaction to anesthesia    Reports father deliurm in his 52's with CABG   GERD (gastroesophageal reflux disease)    History of pneumonia    Hyperlipidemia    Hypertension    Insomnia, unspecified     Muscle weakness (generalized)    Peripheral artery disease (HCC)    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   Redo sternotomy for 21 mm Medtronic Freestyle porcine aortic root graft w/ reimplantation of left main and right coronary arteries   Sleep apnea    diagnosed multiple years ago at [facility] aortic stenosis, congenital - s/p repair during childhood     Past Surgical History:  Procedure Laterality Date   ABDOMINAL AORTAGRAM  06/24/2012   ABDOMINAL AORTAGRAM N/A 06/24/2012   Procedure: ABDOMINAL Maxcine Ham;  Surgeon: Angelia Mould, MD;  Location: [facility] CATH LAB;  Service: Cardiovascular;  Laterality: N/A;   AORTIC VALVE REPLACEMENT N/A 10/07/2014   Procedure: REDO AORTIC VALVE REPLACEMENT (AVR);  Surgeon: Rexene Alberts, MD;  Location: [place];  Service: Open Heart Surgery;  Laterality: N/A;   ASCENDING AORTIC ROOT REPLACEMENT N/A 10/07/2014   Procedure: ASCENDING AORTIC ROOT REPLACEMENT;  Surgeon: Rexene Alberts, MD;  Location: [place];  Service: Open Heart Surgery;  Laterality: N/A;   BIOPSY  09/27/2016   Procedure: BIOPSY;  Surgeon: Daneil Dolin, MD;  [facility];  Service: Endoscopy;;  colon   BIOPSY  10/18/2020   Procedure: BIOPSY;  Surgeon: Eloise Harman, DO;  [facility];  Service: Endoscopy;;   BREAST REDUCTION SURGERY Bilateral 01/21/2018   Procedure: BREAST REDUCTION WITH LIPOSUCTION;  Surgeon: Cristine Polio, MD;  Location: [place];  Service: Plastics;  Laterality: Bilateral;  CARDIAC VALVE SURGERY  1968   CARPAL TUNNEL RELEASE Right 03/02/2020   Procedure: CARPAL TUNNEL RELEASE;  Surgeon: Carole Civil, MD;  Location: [facility] ORS;  Service: Orthopedics;  Laterality: Right;   CATARACT EXTRACTION W/PHACO Left 05/30/2019   Procedure: CATARACT EXTRACTION PHACO AND INTRAOCULAR LENS PLACEMENT (IOC) (CDE: 4.94  );  Surgeon: Baruch Goldmann, MD;  Location: [facility];   Service: Ophthalmology;  Laterality: Left;   CATARACT EXTRACTION W/PHACO Right 06/16/2019   Procedure: CATARACT EXTRACTION PHACO AND INTRAOCULAR LENS PLACEMENT (IOC);  Surgeon: Baruch Goldmann, MD;  Location: [facility];  Service: Ophthalmology;  Laterality: Right;  CDE: 4.64   CERVICAL FUSION     CHOLECYSTECTOMY     COLONOSCOPY WITH PROPOFOL N/A 09/27/2016   Dr. Gala Romney: Diverticulosis, several tubular adenomas removed ranging 4 to 7 mm in size, internal grade 1 hemorrhoids, terminal ileum normal, segmental biopsies negative for microscopic colitis.  Next colonoscopy June 2021   COLONOSCOPY WITH PROPOFOL N/A 05/06/2020   internal hemorrhoids, sigmoid diverticulosis. Surveillance colonoscopy due in 2027   ESOPHAGOGASTRODUODENOSCOPY (EGD) WITH PROPOFOL N/A 09/27/2016   Dr. Gala Romney: Small hiatal hernia, mild Schatzki ring status post disruption, LA grade a esophagitis   ESOPHAGOGASTRODUODENOSCOPY (EGD) WITH PROPOFOL N/A 10/18/2020   Procedure: ESOPHAGOGASTRODUODENOSCOPY (EGD) WITH PROPOFOL;  Surgeon: Eloise Harman, DO;  [facility];  Service: Endoscopy;  Laterality: N/A;  8:45am   ILIAC ARTERY STENT Left 12/2007   IR RADIOLOGIST EVAL & MGMT  10/26/2020   KNEE ARTHROSCOPY WITH MEDIAL MENISECTOMY Right 01/10/2018   Procedure: RIGHT KNEE ARTHROSCOPY WITH PARTIAL MEDIAL MENISECTOMY;  Surgeon: Carole Civil, MD;  [facility];  Service: Orthopedics;  Laterality: Right;   LEFT AND RIGHT HEART CATHETERIZATION WITH CORONARY ANGIOGRAM N/A 07/31/2014   Procedure: LEFT AND RIGHT HEART CATHETERIZATION WITH CORONARY ANGIOGRAM;  Surgeon: Burnell Blanks, MD;  Location: [facility] CATH LAB;  Service: Cardiovascular;  Laterality: N/A;   MALONEY DILATION N/A 09/27/2016   Procedure: Venia Minks DILATION;  Surgeon: Daneil Dolin, MD;  [facility];  Service: Endoscopy;  Laterality: N/A;   POLYPECTOMY  09/27/2016   Procedure: POLYPECTOMY;  Surgeon: Daneil Dolin, MD;  [facility];  Service: Endoscopy;;  colon   ROTATOR CUFF REPAIR Bilateral    TEE WITHOUT CARDIOVERSION N/A 07/07/2014   Procedure: TRANSESOPHAGEAL ECHOCARDIOGRAM (TEE);  Surgeon: Arnoldo Lenis, MD;  [facility];  Service: Cardiology;  Laterality: N/A;   TEE WITHOUT CARDIOVERSION N/A 07/20/2014   Procedure: TRANSESOPHAGEAL ECHOCARDIOGRAM (TEE) WITH PROPOFOL;  Surgeon: Arnoldo Lenis, MD;  Location: [facility];  Service: Endoscopy;  Laterality: N/A;   TEE WITHOUT CARDIOVERSION N/A 10/07/2014   Procedure: TRANSESOPHAGEAL ECHOCARDIOGRAM (TEE);  Surgeon: Rexene Alberts, MD;  Location: [place];  Service: Open Heart Surgery;  Laterality: N/A;    Allergies: Penicillins and Sulfa antibiotics  Medications: Prior to Admission medications   Medication Sig Start Date End Date Taking? Authorizing Provider  aspirin EC 81 MG tablet Take 1 tablet (81 mg total) by mouth daily. Swallow whole. 12/16/19  Yes Branch, Alphonse Guild, MD  diclofenac (VOLTAREN) 75 MG EC tablet TAKE 1 TABLET TWICE A DAY (DO NOT CRUSH) Patient taking differently: Take 75 mg by mouth 2 (two) times daily. 02/16/20  Yes Carole Civil, MD  estradiol (ESTRACE) 2 MG tablet Take 1 tablet (2 mg total) by mouth daily. 09/02/20  Yes Gosrani, Nimish C, MD  fidaxomicin (DIFICID) 200 MG TABS tablet Take 1 tablet (200 mg total)  by mouth 2 (two) times daily with a meal for 10 days. 10/18/20 10/28/20 Yes Annitta Needs, NP  fidaxomicin (DIFICID) 200 MG TABS tablet Take 200 mg by mouth 2 (two) times daily.   Yes [provider]  losartan (COZAAR) 25 MG tablet TAKE 1 TABLET DAILY Patient taking differently: Take 25 mg by mouth daily. 01/14/20  Yes Branch, Alphonse Guild, MD  metoprolol tartrate (LOPRESSOR) 25 MG tablet Take 1 tablet (25 mg total) by mouth 2 (two) times daily. 04/05/20  Yes BranchAlphonse Guild, MD  NP THYROID 120 MG tablet Take 1 tablet (120 mg total) by mouth in the morning and at bedtime. 09/02/20  Yes Gosrani, Nimish C, MD   progesterone (PROMETRIUM) 200 MG capsule Take 2 capsules (400 mg total) by mouth daily. 06/14/20 10/27/20 Yes Gosrani, Nimish C, MD  RABEprazole (ACIPHEX) 20 MG tablet Take 20 mg by mouth daily.   Yes [provider]  CALCIUM MAGNESIUM ZINC PO Take 1 tablet by mouth daily.    [provider]  dicyclomine (BENTYL) 20 MG tablet Take 1 tablet (20 mg total) by mouth 2 (two) times daily as needed for spasms. Patient not taking: No sig reported 09/26/20   Evalee Jefferson, PA-C  Flaxseed, Linseed, (FLAX SEEDS PO) Take 1 capsule by mouth daily.    [provider]  furosemide (LASIX) 40 MG tablet Take 1 tablet (40 mg total) by mouth daily as needed for edema. Patient taking differently: Take 40 mg by mouth daily as needed for fluid. 10/23/17 10/27/20  Arnoldo Lenis, MD  hydrocortisone (ANUSOL-HC) 2.5 % rectal cream Place 1 application rectally 2 (two) times daily. Patient not taking: No sig reported 10/15/20   Annitta Needs, NP  Multiple Vitamin (MULTIVITAMIN WITH MINERALS) TABS tablet Take 1 tablet by mouth daily.    [provider]  pantoprazole (PROTONIX) 40 MG tablet Take 1 tablet (40 mg total) by mouth 2 (two) times daily. Patient not taking: Reported on 10/27/2020 09/19/20 10/27/20  Doree Albee, MD  VITAMIN D PO Take 1 capsule by mouth daily.    [provider]     Family History  Problem Relation Age of Onset   Hypertension Mother    Hypertension Father    Heart disease Father        before age 58   Other Father        varicose veins   COPD Father    Colon cancer Neg Hx    Celiac disease Neg Hx    Inflammatory bowel disease Neg Hx     Social History   Socioeconomic History   Marital status: Married    Spouse name: Not on file   Number of children: 0   Years of education: some coll   Highest education level: Not on file  Occupational History   Occupation: call center    Employer: [employer]    Comment: [employer]  Tobacco Use   Smoking status: Every  Day    Packs/day: 0.50    Years: 40.00    Pack years: 20.00    Types: Cigarettes   Smokeless tobacco: Never  Vaping Use   Vaping Use: Never used  Substance and Sexual Activity   Alcohol use: Yes    Alcohol/week: 2.0 standard drinks    Types: 2 Cans of beer per week   Drug use: No   Sexual activity: Yes    Birth control/protection: None  Other Topics Concern   Not on file  Social History Narrative   Patient is married   for 5 years . Patient works at [employer].. Some college education-did not Writer.Originally from [place].   Social Determinants of Health   Financial Resource Strain: Not on file  Food Insecurity: Not on file  Transportation Needs: Not on file  Physical Activity: Not on file  Stress: Not on file  Social Connections: Not on file     Review of Systems: A 12 point ROS discussed and pertinent positives are indicated in the HPI above.  All other systems are negative.   Vital Signs: BP 117/72   Pulse 63   Temp 98.1 F (36.7 C) (Oral)   Resp 16   Ht 5\' 6"  (1.676 m)   Wt 195 lb (88.5 kg)   SpO2 98%   BMI 31.47 kg/m   Physical Exam Vitals reviewed.  Constitutional:      General: She is not in acute distress.    Appearance: Normal appearance. She is not ill-appearing.  HENT:     Head: Normocephalic and atraumatic.     Mouth/Throat:     Mouth: Mucous membranes are moist.  Cardiovascular:     Rate and Rhythm: Normal rate and regular rhythm.     Pulses: Normal pulses.     Heart sounds: Normal heart sounds.  Pulmonary:     Effort: Pulmonary effort is normal.     Breath sounds: Normal breath sounds.     Comments: Bilateral DP dopplerable  Abdominal:     General: Abdomen is flat. Bowel sounds are normal.     Palpations: Abdomen is soft.  Musculoskeletal:     Cervical back: Neck supple.  Skin:    General: Skin is warm and dry.     Coloration: Skin is not jaundiced or pale.  Neurological:     Mental Status: She is alert and  oriented to person, place, and time.  Psychiatric:        Mood and Affect: Mood normal.        Behavior: Behavior normal.        Judgment: Judgment normal.    MD Evaluation Airway: WNL Heart: WNL Abdomen: WNL Chest/ Lungs: WNL ASA  Classification: 2 Mallampati/Airway Score: Two  Imaging: IR Radiologist Eval & Mgmt  Result Date: 10/26/2020 Please refer to notes tab for details about interventional procedure. (Op Note)  CT Angio Abd/Pel w/ and/or w/o  Result Date: 10/21/2020 CLINICAL DATA:  Chronic mesenteric ischemia Left abdominal pain EXAM: CTA ABDOMEN AND PELVIS WITHOUT AND WITH CONTRAST TECHNIQUE: Multidetector CT imaging of the abdomen and pelvis was performed using the standard protocol during bolus administration of intravenous contrast. Multiplanar reconstructed images and MIPs were obtained and reviewed to evaluate the vascular anatomy. CONTRAST:  121mL OMNIPAQUE IOHEXOL 350 MG/ML SOLN COMPARISON:  CT abdomen pelvis 09/22/2020 FINDINGS: VASCULAR Aorta: Minimal scattered atheromatous plaque of the infrarenal abdominal aorta without aneurysm or flow-limiting stenosis. Celiac: Severe focal stenosis of the proximal celiac artery. Celiac artery is patent beyond this status stenosis. SMA: Severe long segment stenosis of the proximal super mesenteric artery. The superior mesenteric artery is patent beyond this segment of stenosis. Renals: Both renal arteries are patent without evidence of aneurysm, dissection, vasculitis, fibromuscular dysplasia or significant stenosis. IMA: Patent. Enlarged. The enlarged collateral flowing from IMA to SMA, indicating that the SMA stenosis is likely chronic. Inflow: No significant stenosis of the right common, internal, or external iliac arteries. No significant stenosis of the left common or internal iliac arteries.  Left external iliac artery stent is patent with mild intimal hyperplasia within the stented segments. Proximal Outflow: Bilateral common femoral  and visualized portions of the superficial and profunda femoral arteries are patent without evidence of aneurysm, dissection, vasculitis or significant stenosis. Veins: Hepatic, portal, superior mesenteric, splenic veins are patent. Review of the MIP images confirms the above findings. NON-VASCULAR Lower chest: No acute abnormality. Hepatobiliary: No focal liver abnormality is seen. Status post cholecystectomy. No biliary dilatation. Pancreas: Unremarkable. No pancreatic ductal dilatation or surrounding inflammatory changes. Spleen: Normal in size without focal abnormality. Adrenals/Urinary Tract: Adrenal glands are normal in appearance. 9 mm simple cyst seen in the anterior cortex of the left kidney. Kidneys and ureters otherwise normal. No significant abnormality of the bladder. Stomach/Bowel: Stomach is within normal limits. Appendix appears normal. No evidence of bowel wall thickening, distention, or inflammatory changes. Lymphatic: No enlarged abdominal or pelvic lymph nodes. Reproductive: Lobulated enlarged partially calcified uterus consistent with fibroids. Other: No abdominal wall hernia or abnormality. No abdominopelvic ascites. Musculoskeletal: No acute or significant osseous findings. IMPRESSION: VASCULAR 1. Severe focal stenosis of the cephalic artery. 2. Severe long segment stenosis of the proximal superior mesenteric artery. Prominent collateral branch noted originating from enlarged inferior mesenteric artery and supplying superior mesenteric artery, indicating that the SMA stenosis is likely chronic. NON-VASCULAR No acute abnormality of the abdominal organs. Fibroid uterus again seen. Electronically Signed   By: Miachel Roux M.D.   On: 10/21/2020 14:56    Labs:  CBC: Recent Labs    09/22/20 1637 09/26/20 1423 10/27/20 1234 10/29/20 0705  WBC 7.3 5.9 7.0 5.6  HGB 12.0 12.5 13.2 13.0  HCT 36.1 37.0 39.8 38.8  PLT 275 263 251 227    COAGS: Recent Labs    10/29/20 0705  INR 1.0     BMP: Recent Labs    05/05/20 0823 09/01/20 1640 09/22/20 1637 09/26/20 1423 10/27/20 1234  NA 137 138 138 137 138  K 4.3 4.2 4.2 4.3 4.1  CL 101 105 105 107 106  CO2 27 24 25 24 23   GLUCOSE 114* 90 101 126* 104*  BUN 13 13 15 16 10   CALCIUM 9.6 9.4 9.0 9.2 9.5  CREATININE 0.83 0.79 0.77 1.05* 0.69  GFRNONAA >60  --  82 60* >60  GFRAA  --   --  95  --   --     LIVER FUNCTION TESTS: Recent Labs    09/22/20 1637 09/26/20 1423 10/05/20 1550 10/27/20 1234  BILITOT 0.3 0.6 0.3 0.7  AST 17 45* 19 26  ALT 18 46* 20 26  ALKPHOS  --  55  --  63  PROT 6.4 6.8 6.4 6.7  ALBUMIN  --  3.6  --  3.6    TUMOR MARKERS: No results for input(s): AFPTM, CEA, CA199, CHROMGRNA in the last 8760 hours.  Assessment and Plan: 65 y.o. 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 Patient had a consultation visit with Dr. Serafina Royals on 10/26/2020, she was deemed to be a good candidate for mesenteric arteriogram with possible SMA stent placement.  After thorough discussion and shared decision making with Dr. Serafina Royals, patient decided to proceed with the procedure.  Patient presents to [facility] IR today for the procedure. N.p.o. since midnight VSS CBC w/in normal limit  INR 1.0 BMP pending   Risks and benefits of mesenteric arteriogram  with possible SMA stent placement were discussed with the patient including, but not limited to bleeding, infection, vascular injury, and contrast induced renal failure.  This interventional procedure involves the use of X-rays and because of the nature of the planned procedure, it is possible that we will have prolonged use of X-ray fluoroscopy.  Potential radiation risks to you include (but are not limited to) the following: - A slightly elevated risk for cancer  several years later in life. This risk is typically less than 0.5%  percent. This risk is low in comparison to the normal incidence of human cancer, which is 33% for women and 50% for men according to the [employer]. - Radiation induced injury can include skin redness, resembling a rash, tissue breakdown / ulcers and hair loss (which can be temporary or permanent).   The likelihood of either of these occurring depends on the difficulty of the procedure and whether you are sensitive to radiation due to previous procedures, disease, or genetic conditions.   IF your procedure requires a prolonged use of radiation, you will be notified and given written instructions for further action.  It is your responsibility to monitor the irradiated area for the 2 weeks following the procedure and to notify your physician if you are concerned that you have suffered a radiation induced injury.    All of the patient's questions were answered, patient is agreeable to proceed.  Consent signed and in chart.   Thank you for this interesting consult.  I greatly enjoyed meeting Laycie Schriner and look forward to participating in their care.  A copy of this report was sent to the requesting provider on this date.  Electronically Signed: Tera Mater, PA-C 10/29/2020, 8:06 AM   I spent a total of    25 Minutes in face to face in clinical consultation, greater than 50% of which was counseling/coordinating care for mesenteric arteriogram with possible SMA stent placement.

## 2020-10-29 ENCOUNTER — Other Ambulatory Visit: Payer: Self-pay

## 2020-10-29 ENCOUNTER — Other Ambulatory Visit (HOSPITAL_COMMUNITY): Payer: Self-pay | Admitting: Interventional Radiology

## 2020-10-29 ENCOUNTER — Ambulatory Visit (HOSPITAL_COMMUNITY)
Admission: RE | Admit: 2020-10-29 | Discharge: 2020-10-29 | Disposition: A | Discharge status: Home / Self Care | Payer: BC Managed Care – PPO | Source: Ambulatory Visit | Attending: Interventional Radiology | Admitting: Interventional Radiology

## 2020-10-29 ENCOUNTER — Ambulatory Visit (HOSPITAL_BASED_OUTPATIENT_CLINIC_OR_DEPARTMENT_OTHER)
Admission: RE | Admit: 2020-10-29 | Discharge: 2020-10-29 | Disposition: A | Discharge status: Home / Self Care | Payer: BC Managed Care – PPO | Source: Ambulatory Visit | Attending: Student | Admitting: Student

## 2020-10-29 ENCOUNTER — Encounter (HOSPITAL_COMMUNITY): Payer: Self-pay

## 2020-10-29 DIAGNOSIS — Z79899 Other long term (current) drug therapy: Secondary | ICD-10-CM | POA: Insufficient documentation

## 2020-10-29 DIAGNOSIS — Z7982 Long term (current) use of aspirin: Secondary | ICD-10-CM | POA: Diagnosis not present

## 2020-10-29 DIAGNOSIS — I10 Essential (primary) hypertension: Secondary | ICD-10-CM | POA: Diagnosis not present

## 2020-10-29 DIAGNOSIS — Z79890 Hormone replacement therapy (postmenopausal): Secondary | ICD-10-CM | POA: Diagnosis not present

## 2020-10-29 DIAGNOSIS — I724 Aneurysm of artery of lower extremity: Secondary | ICD-10-CM

## 2020-10-29 DIAGNOSIS — Z87891 Personal history of nicotine dependence: Secondary | ICD-10-CM | POA: Insufficient documentation

## 2020-10-29 DIAGNOSIS — Z880 Allergy status to penicillin: Secondary | ICD-10-CM | POA: Insufficient documentation

## 2020-10-29 DIAGNOSIS — K551 Chronic vascular disorders of intestine: Secondary | ICD-10-CM | POA: Diagnosis not present

## 2020-10-29 DIAGNOSIS — Z86718 Personal history of other venous thrombosis and embolism: Secondary | ICD-10-CM | POA: Insufficient documentation

## 2020-10-29 DIAGNOSIS — Z882 Allergy status to sulfonamides status: Secondary | ICD-10-CM | POA: Diagnosis not present

## 2020-10-29 DIAGNOSIS — K219 Gastro-esophageal reflux disease without esophagitis: Secondary | ICD-10-CM | POA: Diagnosis not present

## 2020-10-29 DIAGNOSIS — E785 Hyperlipidemia, unspecified: Secondary | ICD-10-CM | POA: Diagnosis not present

## 2020-10-29 DIAGNOSIS — I739 Peripheral vascular disease, unspecified: Secondary | ICD-10-CM | POA: Insufficient documentation

## 2020-10-29 HISTORY — PX: IR US GUIDE VASC ACCESS RIGHT: IMG2390

## 2020-10-29 HISTORY — PX: IR ANGIOGRAM VISCERAL SELECTIVE: IMG657

## 2020-10-29 HISTORY — PX: IR TRANSCATH PLC STENT 1ST ART NOT LE CV CAR VERT CAR: IMG5443

## 2020-10-29 LAB — BASIC METABOLIC PANEL
Anion gap: 6 (ref 5–15)
BUN: 8 mg/dL (ref 8–23)
CO2: 26 mmol/L (ref 22–32)
Calcium: 9.5 mg/dL (ref 8.9–10.3)
Chloride: 106 mmol/L (ref 98–111)
Creatinine, Ser: 0.88 mg/dL (ref 0.44–1.00)
GFR, Estimated: >60 mL/min (ref >60)
Glucose, Bld: 111 mg/dL — ABNORMAL HIGH (ref 70–99)
Potassium: 3.7 mmol/L (ref 3.5–5.1)
Sodium: 138 mmol/L (ref 135–145)

## 2020-10-29 LAB — CBC
HCT: 38.8 % (ref 36.0–46.0)
Hemoglobin: 13 g/dL (ref 12.0–15.0)
MCH: 29.2 pg (ref 26.0–34.0)
MCHC: 33.5 g/dL (ref 30.0–36.0)
MCV: 87.2 fL (ref 80.0–100.0)
Platelets: 227 10*3/uL (ref 150–400)
RBC: 4.45 MIL/uL (ref 3.87–5.11)
RDW: 11.8 % (ref 11.5–15.5)
WBC: 5.6 10*3/uL (ref 4.0–10.5)
nRBC: 0 % (ref 0.0–0.2)

## 2020-10-29 LAB — PROTIME-INR
INR: 1 (ref 0.8–1.2)
Prothrombin Time: 13.4 seconds (ref 11.4–15.2)

## 2020-10-29 LAB — POCT ACTIVATED CLOTTING TIME
Activated Clotting Time: 196 seconds
Activated Clotting Time: 92 seconds

## 2020-10-29 MED ORDER — LIDOCAINE HCL (PF) 1 % IJ SOLN
INTRAMUSCULAR | Status: AC | PRN
  Administered 2020-10-29: 10 mL

## 2020-10-29 MED ORDER — ACETAMINOPHEN 325 MG PO TABS
650.0000 mg | ORAL_TABLET | Freq: Four times a day (QID) | ORAL | Status: DC | PRN
  Filled 2020-10-29: qty 2

## 2020-10-29 MED ORDER — FENTANYL CITRATE (PF) 100 MCG/2ML IJ SOLN
INTRAMUSCULAR | Status: AC | PRN
  Administered 2020-10-29 (×3): 50 ug via INTRAVENOUS
  Administered 2020-10-29 (×2): 25 ug via INTRAVENOUS

## 2020-10-29 MED ORDER — HEPARIN SODIUM (PORCINE) 1000 UNIT/ML IJ SOLN
INTRAMUSCULAR | Status: AC | PRN
  Administered 2020-10-29: 8000 [IU] via INTRAVENOUS
  Administered 2020-10-29: 3000 [IU] via INTRAVENOUS

## 2020-10-29 MED ORDER — CLOPIDOGREL BISULFATE 75 MG PO TABS: 75.0000 mg | ORAL_TABLET | Freq: Every day | ORAL | 0 refills | Status: DC

## 2020-10-29 MED ORDER — IOHEXOL 300 MG/ML  SOLN
150.0000 mL | Freq: Once | INTRAMUSCULAR | Status: AC | PRN
  Administered 2020-10-29: 20 mL via INTRAVENOUS

## 2020-10-29 MED ORDER — HEPARIN SODIUM (PORCINE) 1000 UNIT/ML IJ SOLN
INTRAMUSCULAR | Status: AC
  Filled 2020-10-29: qty 1

## 2020-10-29 MED ORDER — MIDAZOLAM HCL 2 MG/2ML IJ SOLN
INTRAMUSCULAR | Status: AC | PRN
  Administered 2020-10-29 (×4): 0.5 mg via INTRAVENOUS
  Administered 2020-10-29: 1 mg via INTRAVENOUS

## 2020-10-29 MED ORDER — ACETAMINOPHEN 325 MG PO TABS
ORAL_TABLET | ORAL | Status: AC
  Administered 2020-10-29: 650 mg via ORAL
  Filled 2020-10-29: qty 2

## 2020-10-29 MED ORDER — MIDAZOLAM HCL 2 MG/2ML IJ SOLN
INTRAMUSCULAR | Status: AC
  Filled 2020-10-29: qty 2

## 2020-10-29 MED ORDER — IOHEXOL 300 MG/ML  SOLN
100.0000 mL | Freq: Once | INTRAMUSCULAR | Status: AC | PRN
  Administered 2020-10-29: 70 mL via INTRA_ARTERIAL

## 2020-10-29 MED ORDER — CLOPIDOGREL BISULFATE 75 MG PO TABS
300.0000 mg | ORAL_TABLET | Freq: Once | ORAL | Status: AC
  Administered 2020-10-29: 300 mg via ORAL
  Filled 2020-10-29: qty 4

## 2020-10-29 MED ORDER — LIDOCAINE HCL 1 % IJ SOLN
INTRAMUSCULAR | Status: AC
  Filled 2020-10-29: qty 20

## 2020-10-29 MED ORDER — FENTANYL CITRATE (PF) 100 MCG/2ML IJ SOLN
INTRAMUSCULAR | Status: AC
  Filled 2020-10-29: qty 2

## 2020-10-29 MED ORDER — SODIUM CHLORIDE 0.9 % IV SOLN: INTRAVENOUS | Status: DC

## 2020-10-29 NOTE — Procedures (Signed)
Interventional Radiology Procedure Note  Procedure:  1) Abdominal aortogram 2) Superior mesenteric angiogram 3) Superior mesenteric artery stent placement  Findings: Please refer to procedural dictation for full description.  5x29 mm VBX deployed in SMA ostium, balloon flare of proximal stent to 7 mm.  Proglide closure with persistent bleeding from access site after deployment, manual compression held to achieve hemostasis.  No change in palpable peripheral pulses.  Complications: None immediate  Estimated Blood Loss: 10 mL  Recommendations: Flat bedrest for 4 hours.  Head of bed up to 30 degrees for 2 hours.   Plavix 300 mg once prior to discharge, administer once no evidence of groin access site complication.  This will be followed by 75 mg once daily starting tomorrow.  Continue daily 81 mg aspirin.    Ruthann Cancer, MD Pager: [phone]

## 2020-10-29 NOTE — Progress Notes (Signed)
Pt ambulated without difficulty or bleeding.   Discharged home with her husband who will drive and stay with pt x 24 hrs. 

## 2020-10-29 NOTE — Progress Notes (Signed)
Right groin duplex demonstrates hematoma without persistent pseudoaneurysm.  Right groin tender to palpation but soft.  Palpable pedal pulses.    Tolerating ginger ale, some mild nausea.  Abdominal pain improving.  Plan for loading dose of Plavix now, start 75 mg QD tomorrow.  Will arrange for 1 month follow up in IR clinic.   Ruthann Cancer, MD Pager: [phone]

## 2020-10-29 NOTE — Progress Notes (Signed)
Limited right lower extremity arterial duplex completed. Refer to "CV Proc" under chart review to view preliminary results.  10/29/2020 4:15 PM Kelby Aline., MHA, RVT, RDCS, RDMS

## 2020-10-29 NOTE — Sedation Documentation (Signed)
ACT 196 

## 2020-10-29 NOTE — Progress Notes (Signed)
Notable small hematoma, Elmyra Ricks IR tech still holding manual pressure at this time

## 2020-10-29 NOTE — Progress Notes (Signed)
Arterial device failed to deploy. Holding manual pressure at this time

## 2020-10-29 NOTE — Sedation Documentation (Signed)
ACT 92

## 2020-10-29 NOTE — Progress Notes (Signed)
Patient transported to short stay. Marzella Schlein RN at the bedside. Groin site was assessed. Notable ecchymosis to area. Site is clean, dry and intact. Soft to palpation at the this time. Dressing has a quik clot which will need to be removed on 7/9 at 1222. Distal pulses intact via palpation.

## 2020-11-02 ENCOUNTER — Other Ambulatory Visit: Payer: Self-pay | Admitting: Interventional Radiology

## 2020-11-02 ENCOUNTER — Other Ambulatory Visit: Payer: BC Managed Care – PPO

## 2020-11-02 ENCOUNTER — Telehealth: Payer: Self-pay | Admitting: *Deleted

## 2020-11-02 DIAGNOSIS — K551 Chronic vascular disorders of intestine: Secondary | ICD-10-CM

## 2020-11-02 NOTE — Telephone Encounter (Signed)
Angie, I am not sure the specifics on this and do not want to give wrong information. Even with physician out of office, there is coverage with nursing staff. Recommend she reach out to them for those specifics.

## 2020-11-02 NOTE — Telephone Encounter (Signed)
Noted and pt was made aware.  She voiced understanding.

## 2020-11-02 NOTE — Telephone Encounter (Signed)
Pt had stent placed Friday.  She said the doctor who did it is on vacation this week.  Pt still with left sided pain and has fear of eating.  Wants to know how long to recover from this.  How long until pain goes away?  How long until she heals?  Still shaky since placement.  She is back to work today.

## 2020-11-03 ENCOUNTER — Ambulatory Visit (HOSPITAL_COMMUNITY): Admission: RE | Admit: 2020-11-03 | Payer: BC Managed Care – PPO | Source: Ambulatory Visit

## 2020-11-03 ENCOUNTER — Telehealth (INDEPENDENT_AMBULATORY_CARE_PROVIDER_SITE_OTHER): Payer: Self-pay

## 2020-11-03 ENCOUNTER — Other Ambulatory Visit: Payer: Self-pay

## 2020-11-03 ENCOUNTER — Ambulatory Visit (HOSPITAL_COMMUNITY)
Admission: RE | Admit: 2020-11-03 | Discharge: 2020-11-03 | Disposition: A | Discharge status: Home / Self Care | Payer: BC Managed Care – PPO | Source: Ambulatory Visit | Attending: Interventional Radiology | Admitting: Interventional Radiology

## 2020-11-03 DIAGNOSIS — K551 Chronic vascular disorders of intestine: Secondary | ICD-10-CM | POA: Diagnosis not present

## 2020-11-03 NOTE — Telephone Encounter (Signed)
.  Transition Care Management Follow-up Telephone Call Date of discharge and from where: 10/27/2020 @ 6:08PM FROM [facility] ER DEPT. How have you been since you were released from the hospital? TERRIBLE, PAIN. NO IMPROVEMENT. NOT EATING MUCH. JUST TAKING FLUIDS FROM TIME TO TIME. Any questions or concerns? Yes, DOS IT SHOW HER ER VISIT & APPTS UPCOMING?  Items Reviewed: Did the pt receive and understand the discharge instructions provided? Yes  Medications obtained and verified? Yes  Other? Yes  Any new allergies since your discharge? No  Dietary orders reviewed? Yes, NOT EATING , STAYING HYDRATED.  Do you have support at home? Yes   Home Care and Equipment/Supplies: Were home health services ordered? no If so, what is the name of the agency? N/A  Has the agency set up a time to come to the patient's home? yes Were any new equipment or medical supplies ordered?  No What is the name of the medical supply agency? N/a Were you able to get the supplies/equipment? not applicable Do you have any questions related to the use of the equipment or supplies? No  Functional Questionnaire: (I = Independent and D = Dependent) ADLs: I  Bathing/Dressing- I  Meal Prep- I  Eating- I, ONLY TAKING FLUIDS, LIGHT FOOD.   Maintaining continence- I  Transferring/Ambulation- I  Managing Meds- I  Follow up appointments reviewed:  PCP Hospital f/u appt confirmed? Yes  Scheduled to see [facility] GOSRANI,MD on 11/09/20 @ [facility] f/u appt confirmed? No  Scheduled to see [facility] on UNKNOW, PENDING TESTING @ N/A. Are transportation arrangements needed? No  If their condition worsens, is the pt aware to call PCP or go to the Emergency Dept.? Yes Was the patient provided with contact information for the PCP's office or ED? Yes Was to pt encouraged to call back with questions or concerns? Yes PAIN HAS NOT GONE AWAY. PT IS NOW CURRENTLY GOING TO GET IMAGES DONE NOW.

## 2020-11-08 ENCOUNTER — Other Ambulatory Visit: Payer: Self-pay | Admitting: Interventional Radiology

## 2020-11-08 DIAGNOSIS — K559 Vascular disorder of intestine, unspecified: Secondary | ICD-10-CM

## 2020-11-09 ENCOUNTER — Encounter (INDEPENDENT_AMBULATORY_CARE_PROVIDER_SITE_OTHER): Payer: Self-pay | Admitting: Internal Medicine

## 2020-11-09 ENCOUNTER — Other Ambulatory Visit: Payer: Self-pay

## 2020-11-09 ENCOUNTER — Ambulatory Visit (INDEPENDENT_AMBULATORY_CARE_PROVIDER_SITE_OTHER): Payer: BC Managed Care – PPO | Admitting: Internal Medicine

## 2020-11-09 VITALS — BP 124/70 | HR 91 | Temp 97.3°F | Ht 66.0 in | Wt 196.2 lb

## 2020-11-09 DIAGNOSIS — R5381 Other malaise: Secondary | ICD-10-CM

## 2020-11-09 DIAGNOSIS — R5383 Other fatigue: Secondary | ICD-10-CM

## 2020-11-09 DIAGNOSIS — N951 Menopausal and female climacteric states: Secondary | ICD-10-CM | POA: Diagnosis not present

## 2020-11-09 DIAGNOSIS — R1032 Left lower quadrant pain: Secondary | ICD-10-CM

## 2020-11-09 DIAGNOSIS — I10 Essential (primary) hypertension: Secondary | ICD-10-CM

## 2020-11-09 LAB — PROGESTERONE: Progesterone: 12.2 ng/mL

## 2020-11-09 LAB — ESTRADIOL: Estradiol: 90 pg/mL

## 2020-11-09 LAB — T3, FREE: T3, Free: 3.7 pg/mL (ref 2.3–4.2)

## 2020-11-09 LAB — TSH: TSH: <0.01 mIU/L — ABNORMAL LOW (ref 0.40–4.50)

## 2020-11-09 MED ORDER — NP THYROID 120 MG PO TABS: 120.0000 mg | ORAL_TABLET | Freq: Two times a day (BID) | ORAL | 1 refills | Status: DC

## 2020-11-09 NOTE — Progress Notes (Signed)
Metrics: Intervention Frequency ACO  Documented Smoking Status Yearly  Screened one or more times in 24 months  Cessation Counseling or  Active cessation medication Past 24 months  Past 24 months   Guideline developer: UpToDate (See UpToDate for funding source) Date Released: 2014       Wellness Office Visit  Subjective:  Patient ID: Michelle Patton, female    DOB: January 13, 1956  Age: 65 y.o. MRN: [id number]  CC: This lady comes in for follow-up of bioidentical hormone therapy, thyroid hypofunction therapy. HPI  She continues on higher dose of estradiol 2 mg daily, progesterone 400 mg at night and also NP thyroid 120 mg twice a day which she has tolerated. In the interim between me seeing her early June and today, she has had bowel ischemia which was the cause of her abdominal pain with rectal bleeding.  Fortunately, she underwent stenting procedure in her SMA primarily and now her symptoms are significantly improved. Past Medical History:  Diagnosis Date   Anxiety    Aortic regurgitation 07/20/2014   Aortic stenosis, severe 07/20/2014   Arthritis    DVT (deep venous thrombosis) (HCC)    Family history of adverse reaction to anesthesia    Reports father deliurm in his 64's with CABG   GERD (gastroesophageal reflux disease)    History of pneumonia    Hyperlipidemia    Hypertension    Insomnia, unspecified    Muscle weakness (generalized)    Peripheral artery disease (HCC)    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   Redo sternotomy for 21 mm Medtronic Freestyle porcine aortic root graft w/ reimplantation of left main and right coronary arteries   Sleep apnea    diagnosed multiple years ago at [facility] aortic stenosis, congenital - s/p repair during childhood    Past Surgical History:  Procedure Laterality Date   ABDOMINAL AORTAGRAM  06/24/2012   ABDOMINAL AORTAGRAM N/A 06/24/2012   Procedure: ABDOMINAL Maxcine Ham;  Surgeon:  Angelia Mould, MD;  Location: [facility] CATH LAB;  Service: Cardiovascular;  Laterality: N/A;   AORTIC VALVE REPLACEMENT N/A 10/07/2014   Procedure: REDO AORTIC VALVE REPLACEMENT (AVR);  Surgeon: Rexene Alberts, MD;  Location: [place];  Service: Open Heart Surgery;  Laterality: N/A;   ASCENDING AORTIC ROOT REPLACEMENT N/A 10/07/2014   Procedure: ASCENDING AORTIC ROOT REPLACEMENT;  Surgeon: Rexene Alberts, MD;  Location: [place];  Service: Open Heart Surgery;  Laterality: N/A;   BIOPSY  09/27/2016   Procedure: BIOPSY;  Surgeon: Daneil Dolin, MD;  [facility];  Service: Endoscopy;;  colon   BIOPSY  10/18/2020   Procedure: BIOPSY;  Surgeon: Eloise Harman, DO;  [facility];  Service: Endoscopy;;   BREAST REDUCTION SURGERY Bilateral 01/21/2018   Procedure: BREAST REDUCTION WITH LIPOSUCTION;  Surgeon: Cristine Polio, MD;  Location: [place];  Service: Plastics;  Laterality: Bilateral;   CARDIAC VALVE SURGERY  1968   CARPAL TUNNEL RELEASE Right 03/02/2020   Procedure: CARPAL TUNNEL RELEASE;  Surgeon: Carole Civil, MD;  [facility];  Service: Orthopedics;  Laterality: Right;   CATARACT EXTRACTION W/PHACO Left 05/30/2019   Procedure: CATARACT EXTRACTION PHACO AND INTRAOCULAR LENS PLACEMENT (IOC) (CDE: 4.94  );  Surgeon: Baruch Goldmann, MD;  [facility]: [facility];  Service: Ophthalmology;  Laterality: Left;   CATARACT EXTRACTION W/PHACO Right 06/16/2019   Procedure: CATARACT EXTRACTION PHACO AND INTRAOCULAR LENS PLACEMENT (IOC);  Surgeon: Baruch Goldmann,  MD;  [facility];  Service: Ophthalmology;  Laterality: Right;  CDE: 4.64   CERVICAL FUSION     CHOLECYSTECTOMY     COLONOSCOPY WITH PROPOFOL N/A 09/27/2016   Dr. Gala Romney: Diverticulosis, several tubular adenomas removed ranging 4 to 7 mm in size, internal grade 1 hemorrhoids, terminal ileum normal, segmental biopsies negative for microscopic colitis.  Next colonoscopy June 2021   COLONOSCOPY  WITH PROPOFOL N/A 05/06/2020   internal hemorrhoids, sigmoid diverticulosis. Surveillance colonoscopy due in 2027   ESOPHAGOGASTRODUODENOSCOPY (EGD) WITH PROPOFOL N/A 09/27/2016   Dr. Gala Romney: Small hiatal hernia, mild Schatzki ring status post disruption, LA grade a esophagitis   ESOPHAGOGASTRODUODENOSCOPY (EGD) WITH PROPOFOL N/A 10/18/2020   Procedure: ESOPHAGOGASTRODUODENOSCOPY (EGD) WITH PROPOFOL;  Surgeon: Eloise Harman, DO;  [facility];  Service: Endoscopy;  Laterality: N/A;  8:45am   ILIAC ARTERY STENT Left 12/2007   IR ANGIOGRAM VISCERAL SELECTIVE  10/29/2020   IR RADIOLOGIST EVAL & MGMT  10/26/2020   IR TRANSCATH PLC STENT 1ST ART NOT LE CV CAR VERT CAR  10/29/2020   IR US GUIDE VASC ACCESS RIGHT  10/29/2020   KNEE ARTHROSCOPY WITH MEDIAL MENISECTOMY Right 01/10/2018   Procedure: RIGHT KNEE ARTHROSCOPY WITH PARTIAL MEDIAL MENISECTOMY;  Surgeon: Carole Civil, MD;  [facility];  Service: Orthopedics;  Laterality: Right;   LEFT AND RIGHT HEART CATHETERIZATION WITH CORONARY ANGIOGRAM N/A 07/31/2014   Procedure: LEFT AND RIGHT HEART CATHETERIZATION WITH CORONARY ANGIOGRAM;  Surgeon: Burnell Blanks, MD;  Location: [facility] CATH LAB;  Service: Cardiovascular;  Laterality: N/A;   MALONEY DILATION N/A 09/27/2016   Procedure: Venia Minks DILATION;  Surgeon: Daneil Dolin, MD;  [facility];  Service: Endoscopy;  Laterality: N/A;   POLYPECTOMY  09/27/2016   Procedure: POLYPECTOMY;  Surgeon: Daneil Dolin, MD;  [facility];  Service: Endoscopy;;  colon   ROTATOR CUFF REPAIR Bilateral    TEE WITHOUT CARDIOVERSION N/A 07/07/2014   Procedure: TRANSESOPHAGEAL ECHOCARDIOGRAM (TEE);  Surgeon: Arnoldo Lenis, MD;  [facility];  Service: Cardiology;  Laterality: N/A;   TEE WITHOUT CARDIOVERSION N/A 07/20/2014   Procedure: TRANSESOPHAGEAL ECHOCARDIOGRAM (TEE) WITH PROPOFOL;  Surgeon: Arnoldo Lenis, MD;  [facility];  Service: Endoscopy;   Laterality: N/A;   TEE WITHOUT CARDIOVERSION N/A 10/07/2014   Procedure: TRANSESOPHAGEAL ECHOCARDIOGRAM (TEE);  Surgeon: Rexene Alberts, MD;  Location: [place];  Service: Open Heart Surgery;  Laterality: N/A;     Family History  Problem Relation Age of Onset   Hypertension Mother    Hypertension Father    Heart disease Father        before age 39   Other Father        varicose veins   COPD Father    Colon cancer Neg Hx    Celiac disease Neg Hx    Inflammatory bowel disease Neg Hx     Social History   Social History Narrative   Patient is married   for 5 years . Patient works at [employer].. Some college education-did not Writer.Originally from [place].   Social History   Tobacco Use   Smoking status: Every Day    Packs/day: 0.50    Years: 40.00    Pack years: 20.00    Types: Cigarettes   Smokeless tobacco: Never  Substance Use Topics   Alcohol use: Yes    Alcohol/week: 2.0 standard drinks    Types: 2 Cans of beer per week  Current Meds  Medication Sig   aspirin EC 81 MG tablet Take 1 tablet (81 mg total) by mouth daily. Swallow whole.   clopidogrel (PLAVIX) 75 MG tablet Take 1 tablet (75 mg total) by mouth daily.   diclofenac (VOLTAREN) 75 MG EC tablet TAKE 1 TABLET TWICE A DAY (DO NOT CRUSH) (Patient taking differently: Take 75 mg by mouth 2 (two) times daily.)   estradiol (ESTRACE) 2 MG tablet Take 1 tablet (2 mg total) by mouth daily.   fidaxomicin (DIFICID) 200 MG TABS tablet Take 200 mg by mouth 2 (two) times daily.   furosemide (LASIX) 40 MG tablet Take 1 tablet (40 mg total) by mouth daily as needed for edema. (Patient taking differently: Take 40 mg by mouth daily as needed for fluid.)   losartan (COZAAR) 25 MG tablet TAKE 1 TABLET DAILY (Patient taking differently: Take 25 mg by mouth daily.)   metoprolol tartrate (LOPRESSOR) 25 MG tablet Take 1 tablet (25 mg total) by mouth 2 (two) times daily.   NP THYROID 120 MG tablet Take 1  tablet (120 mg total) by mouth in the morning and at bedtime.   progesterone (PROMETRIUM) 200 MG capsule Take 2 capsules (400 mg total) by mouth daily.   RABEprazole (ACIPHEX) 20 MG tablet Take 20 mg by mouth daily.   [DISCONTINUED] NP THYROID 120 MG tablet Take 1 tablet (120 mg total) by mouth in the morning and at bedtime.     [place] Office Visit from 09/01/2020 in [facility]  PHQ-9 Total Score 0       Objective:   Today's Vitals: BP 124/70   Pulse 91   Temp (!) 97.3 F (36.3 C) (Temporal)   Ht 5\' 6"  (1.676 m)   Wt 196 lb 3.2 oz (89 kg)   SpO2 97%   BMI 31.67 kg/m  Vitals with BMI 11/09/2020 10/29/2020 10/29/2020  Height 5\' 6"  - -  Weight 196 lbs 3 oz - -  BMI 55.37 - -  Systolic 482 707 867  Diastolic 70 59 43  Pulse 91 70 76     Physical Exam She looks systemically well.  No new physical findings today.      Assessment   1. Left lower quadrant abdominal pain   2. Essential hypertension   3. Hot flashes due to menopause   4. Malaise and fatigue       Tests ordered Orders Placed This Encounter  Procedures   Estradiol   Progesterone   T3, free   TSH      Plan: 1.  Continue with estradiol and progesterone and we will check levels today. 2.  Continue with NP thyroid and I will check thyroid function today. 3.  Follow-up in 3 months.  Further recommendations will depend on blood results    Meds ordered this encounter  Medications   NP THYROID 120 MG tablet    Sig: Take 1 tablet (120 mg total) by mouth in the morning and at bedtime.    Dispense:  180 tablet    Refill:  1     Cheynne Virden Luther Parody, MD

## 2020-11-10 ENCOUNTER — Ambulatory Visit
Admission: RE | Admit: 2020-11-10 | Discharge: 2020-11-10 | Disposition: A | Discharge status: Home / Self Care | Payer: BC Managed Care – PPO | Source: Ambulatory Visit | Attending: Interventional Radiology | Admitting: Interventional Radiology

## 2020-11-10 ENCOUNTER — Encounter: Payer: Self-pay | Admitting: *Deleted

## 2020-11-10 DIAGNOSIS — K559 Vascular disorder of intestine, unspecified: Secondary | ICD-10-CM

## 2020-11-10 HISTORY — PX: IR RADIOLOGIST EVAL & MGMT: IMG5224

## 2020-11-10 NOTE — Procedures (Signed)
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   She is now status post SMA covered balloon-expandable stent placement on 10/29/20.  Her abdominal pain only improved marginally after the procedure, therefore clinic visit was planned for today, ahead of her scheduled 1 month follow up.  Thankfully, she reports that yesterday her abdominal pain finally resolved, and she's feeling back to normal.  She is tolerating a diet.  No blood in her stool.  She denies nausea.  She is taking her Plavix as prescribed without any abnormal bleeding.  Her right groin has a firm, non-tender knot that is slowly resolving.  She underwent abdominal duplex study last week which demonstrated patency of the SMA stent.     Past Medical History:  Diagnosis Date   Anxiety    Aortic regurgitation 07/20/2014   Aortic stenosis, severe 07/20/2014   Arthritis    DVT (deep venous thrombosis) (HCC)    Family history of adverse reaction to anesthesia    Reports father deliurm in his 28's with CABG   GERD (gastroesophageal reflux disease)    History of pneumonia    Hyperlipidemia    Hypertension    Insomnia, unspecified    Muscle weakness (generalized)    Peripheral artery disease (HCC)    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   Redo sternotomy for 21 mm Medtronic Freestyle porcine aortic root graft w/ reimplantation of left main and right coronary arteries   Sleep apnea    diagnosed multiple years ago at [facility] aortic stenosis, congenital - s/p repair during childhood     Past Surgical History:  Procedure Laterality Date   ABDOMINAL AORTAGRAM  06/24/2012   ABDOMINAL AORTAGRAM N/A  06/24/2012   Procedure: ABDOMINAL Maxcine Ham;  Surgeon: Angelia Mould, MD;  Location: [facility] CATH LAB;  Service: Cardiovascular;  Laterality: N/A;   AORTIC VALVE REPLACEMENT N/A 10/07/2014   Procedure: REDO AORTIC VALVE REPLACEMENT (AVR);  Surgeon: Rexene Alberts, MD;  Location: [place];  Service: Open Heart Surgery;  Laterality: N/A;   ASCENDING AORTIC ROOT REPLACEMENT N/A 10/07/2014   Procedure: ASCENDING AORTIC ROOT REPLACEMENT;  Surgeon: Rexene Alberts, MD;  Location: [place];  Service: Open Heart Surgery;  Laterality: N/A;   BIOPSY  09/27/2016   Procedure: BIOPSY;  Surgeon: Daneil Dolin, MD;  [facility];  Service: Endoscopy;;  colon   BIOPSY  10/18/2020   Procedure: BIOPSY;  Surgeon: Eloise Harman, DO;  [facility];  Service: Endoscopy;;   BREAST REDUCTION SURGERY Bilateral 01/21/2018   Procedure: BREAST REDUCTION WITH LIPOSUCTION;  Surgeon: Cristine Polio, MD;  Location: [place];  Service: Plastics;  Laterality: Bilateral;   CARDIAC VALVE SURGERY  1968   CARPAL TUNNEL RELEASE Right 03/02/2020   Procedure: CARPAL TUNNEL RELEASE;  Surgeon: Carole Civil, MD;  [facility]: [facility];  Service: Orthopedics;  Laterality: Right;   CATARACT EXTRACTION W/PHACO Left 05/30/2019   Procedure: CATARACT EXTRACTION PHACO AND INTRAOCULAR LENS PLACEMENT (IOC) (CDE: 4.94  );  Surgeon: Baruch Goldmann, MD;  [facility]: [facility];  Service: Ophthalmology;  Laterality: Left;   CATARACT EXTRACTION W/PHACO Right 06/16/2019   Procedure: CATARACT EXTRACTION PHACO AND INTRAOCULAR  LENS PLACEMENT (IOC);  Surgeon: Baruch Goldmann, MD;  [facility]: [facility];  Service: Ophthalmology;  Laterality: Right;  CDE: 4.64   CERVICAL FUSION     CHOLECYSTECTOMY     COLONOSCOPY WITH PROPOFOL N/A 09/27/2016   Dr. Gala Romney: Diverticulosis, several tubular adenomas removed ranging 4 to 7 mm in size, internal grade 1 hemorrhoids, terminal ileum normal, segmental biopsies negative for microscopic  colitis.  Next colonoscopy June 2021   COLONOSCOPY WITH PROPOFOL N/A 05/06/2020   internal hemorrhoids, sigmoid diverticulosis. Surveillance colonoscopy due in 2027   ESOPHAGOGASTRODUODENOSCOPY (EGD) WITH PROPOFOL N/A 09/27/2016   Dr. Gala Romney: Small hiatal hernia, mild Schatzki ring status post disruption, LA grade a esophagitis   ESOPHAGOGASTRODUODENOSCOPY (EGD) WITH PROPOFOL N/A 10/18/2020   Procedure: ESOPHAGOGASTRODUODENOSCOPY (EGD) WITH PROPOFOL;  Surgeon: Eloise Harman, DO;  [facility];  Service: Endoscopy;  Laterality: N/A;  8:45am   ILIAC ARTERY STENT Left 12/2007   IR ANGIOGRAM VISCERAL SELECTIVE  10/29/2020   IR RADIOLOGIST EVAL & MGMT  10/26/2020   IR TRANSCATH PLC STENT 1ST ART NOT LE CV CAR VERT CAR  10/29/2020   IR US GUIDE VASC ACCESS RIGHT  10/29/2020   KNEE ARTHROSCOPY WITH MEDIAL MENISECTOMY Right 01/10/2018   Procedure: RIGHT KNEE ARTHROSCOPY WITH PARTIAL MEDIAL MENISECTOMY;  Surgeon: Carole Civil, MD;  [facility];  Service: Orthopedics;  Laterality: Right;   LEFT AND RIGHT HEART CATHETERIZATION WITH CORONARY ANGIOGRAM N/A 07/31/2014   Procedure: LEFT AND RIGHT HEART CATHETERIZATION WITH CORONARY ANGIOGRAM;  Surgeon: Burnell Blanks, MD;  Location: [facility] CATH LAB;  Service: Cardiovascular;  Laterality: N/A;   MALONEY DILATION N/A 09/27/2016   Procedure: Venia Minks DILATION;  Surgeon: Daneil Dolin, MD;  [facility];  Service: Endoscopy;  Laterality: N/A;   POLYPECTOMY  09/27/2016   Procedure: POLYPECTOMY;  Surgeon: Daneil Dolin, MD;  [facility];  Service: Endoscopy;;  colon   ROTATOR CUFF REPAIR Bilateral    TEE WITHOUT CARDIOVERSION N/A 07/07/2014   Procedure: TRANSESOPHAGEAL ECHOCARDIOGRAM (TEE);  Surgeon: Arnoldo Lenis, MD;  [facility];  Service: Cardiology;  Laterality: N/A;   TEE WITHOUT CARDIOVERSION N/A 07/20/2014   Procedure: TRANSESOPHAGEAL ECHOCARDIOGRAM (TEE) WITH PROPOFOL;  Surgeon: Arnoldo Lenis, MD;  [facility];  Service: Endoscopy;  Laterality: N/A;   TEE WITHOUT CARDIOVERSION N/A 10/07/2014   Procedure: TRANSESOPHAGEAL ECHOCARDIOGRAM (TEE);  Surgeon: Rexene Alberts, MD;  Location: [place];  Service: Open Heart Surgery;  Laterality: N/A;    Allergies: Penicillins and Sulfa antibiotics  Medications: Prior to Admission medications   Medication Sig Start Date End Date Taking? Authorizing Provider  aspirin EC 81 MG tablet Take 1 tablet (81 mg total) by mouth daily. Swallow whole. 12/16/19   Arnoldo Lenis, MD  clopidogrel (PLAVIX) 75 MG tablet Take 1 tablet (75 mg total) by mouth daily. 10/30/20   Areyanna Figeroa, Rosanne Ashing, MD  diclofenac (VOLTAREN) 75 MG EC tablet TAKE 1 TABLET TWICE A DAY (DO NOT CRUSH) Patient taking differently: Take 75 mg by mouth 2 (two) times daily. 02/16/20   Carole Civil, MD  estradiol (ESTRACE) 2 MG tablet Take 1 tablet (2 mg total) by mouth daily. 09/02/20   Doree Albee, MD  fidaxomicin (DIFICID) 200 MG TABS tablet Take 200 mg by mouth 2 (two) times daily.    [provider]  furosemide (LASIX) 40 MG tablet Take 1 tablet (40 mg total) by mouth daily as needed for edema. Patient taking  differently: Take 40 mg by mouth daily as needed for fluid. 10/23/17 11/09/20  Arnoldo Lenis, MD  losartan (COZAAR) 25 MG tablet TAKE 1 TABLET DAILY Patient taking differently: Take 25 mg by mouth daily. 01/14/20   Arnoldo Lenis, MD  metoprolol tartrate (LOPRESSOR) 25 MG tablet Take 1 tablet (25 mg total) by mouth 2 (two) times daily. 04/05/20   Arnoldo Lenis, MD  NP THYROID 120 MG tablet Take 1 tablet (120 mg total) by mouth in the morning and at bedtime. 11/09/20   Doree Albee, MD  progesterone (PROMETRIUM) 200 MG capsule Take 2 capsules (400 mg total) by mouth daily. 06/14/20 11/09/20  Doree Albee, MD  RABEprazole (ACIPHEX) 20 MG tablet Take 20 mg by mouth daily.    [provider]     Family History  Problem Relation  Age of Onset   Hypertension Mother    Hypertension Father    Heart disease Father        before age 59   Other Father        varicose veins   COPD Father    Colon cancer Neg Hx    Celiac disease Neg Hx    Inflammatory bowel disease Neg Hx     Social History   Socioeconomic History   Marital status: Married    Spouse name: Not on file   Number of children: 0   Years of education: some coll   Highest education level: Not on file  Occupational History   Occupation: call center    Employer: [employer]    Comment: [employer]  Tobacco Use   Smoking status: Every Day    Packs/day: 0.50    Years: 40.00    Pack years: 20.00    Types: Cigarettes   Smokeless tobacco: Never  Vaping Use   Vaping Use: Never used  Substance and Sexual Activity   Alcohol use: Yes    Alcohol/week: 2.0 standard drinks    Types: 2 Cans of beer per week   Drug use: No   Sexual activity: Yes    Birth control/protection: None  Other Topics Concern   Not on file  Social History Narrative   Patient is married   for 5 years . Patient works at [employer].. Some college education-did not Writer.Originally from [place].   Social Determinants of Health   Financial Resource Strain: Not on file  Food Insecurity: Not on file  Transportation Needs: Not on file  Physical Activity: Not on file  Stress: Not on file  Social Connections: Not on file     Vital Signs: There were no vitals taken for this visit.  No physical examination performed in lieu of telephone visit.     Imaging: SMA angio (10/29/20) Pre:   Post    Abdominal duplex (11/03/20) IMPRESSION: 1. No significant residual proximal SMA stenosis post stent assisted angioplasty. 2. Elevated velocities in the proximal celiac axis consistent with known high-grade stenosis.   Labs:  CBC: Recent Labs    09/22/20 1637 09/26/20 1423 10/27/20 1234 10/29/20 0705  WBC 7.3 5.9 7.0 5.6  HGB 12.0 12.5 13.2 13.0  HCT 36.1  37.0 39.8 38.8  PLT 275 263 251 227    COAGS: Recent Labs    10/29/20 0705  INR 1.0    BMP: Recent Labs    09/22/20 1637 09/26/20 1423 10/27/20 1234 10/29/20 0705  NA 138 137 138 138  K 4.2 4.3 4.1 3.7  CL 105  107 106 106  CO2 25 24 23 26   GLUCOSE 101 126* 104* 111*  BUN 15 16 10 8   CALCIUM 9.0 9.2 9.5 9.5  CREATININE 0.77 1.05* 0.69 0.88  GFRNONAA 82 60* >60 >60  GFRAA 95  --   --   --     LIVER FUNCTION TESTS: Recent Labs    09/22/20 1637 09/26/20 1423 10/05/20 1550 10/27/20 1234  BILITOT 0.3 0.6 0.3 0.7  AST 17 45* 19 26  ALT 18 46* 20 26  ALKPHOS  --  55  --  63  PROT 6.4 6.8 6.4 6.7  ALBUMIN  --  3.6  --  3.6    Assessment and Plan:  65 year old female with history of chronic mesenteric ischemia now status post superior mesenteric artery covered stent placement.  Her pain has now resolved and she is tolerating a normal diet.  She is tolerating dual antiplatelet therapy.    -continue aspirin 81 mg + Plavix 75 mg daily for 3 months -follow up in 10-12 weeks with CTA abdomen/pelvis     Electronically Signed: Rosanne Ashing Reid Regas 11/10/2020, 10:29 AM   I spent a total of 25 Minutes in face to face in clinical consultation, greater than 50% of which was counseling/coordinating care for chronic mesenteric ischemia

## 2020-12-03 ENCOUNTER — Ambulatory Visit: Payer: BC Managed Care – PPO | Admitting: Gastroenterology

## 2020-12-14 ENCOUNTER — Telehealth (INDEPENDENT_AMBULATORY_CARE_PROVIDER_SITE_OTHER): Payer: Self-pay

## 2020-12-14 DIAGNOSIS — Z780 Asymptomatic menopausal state: Secondary | ICD-10-CM

## 2020-12-14 MED ORDER — PROGESTERONE 200 MG PO CAPS: 400.0000 mg | ORAL_CAPSULE | Freq: Every day | ORAL | 0 refills | Status: DC

## 2020-12-14 NOTE — Telephone Encounter (Signed)
Patient called and stated that she needs a refill of the following medication to [facility] and she has an appointment in December for first appointment and a medication check in late September:  progesterone (PROMETRIUM) 200 MG capsule  Last filled 06/14/2020, # 180 with 1 refill

## 2020-12-15 ENCOUNTER — Ambulatory Visit: Payer: BC Managed Care – PPO | Admitting: Gastroenterology

## 2020-12-22 ENCOUNTER — Ambulatory Visit: Payer: BC Managed Care – PPO | Admitting: Neurology

## 2021-01-10 ENCOUNTER — Other Ambulatory Visit: Payer: Self-pay | Admitting: Cardiology

## 2021-01-11 ENCOUNTER — Ambulatory Visit: Payer: BC Managed Care – PPO | Admitting: Gastroenterology

## 2021-01-14 ENCOUNTER — Other Ambulatory Visit: Payer: Self-pay | Admitting: Interventional Radiology

## 2021-01-14 DIAGNOSIS — K559 Vascular disorder of intestine, unspecified: Secondary | ICD-10-CM

## 2021-01-19 ENCOUNTER — Ambulatory Visit (INDEPENDENT_AMBULATORY_CARE_PROVIDER_SITE_OTHER): Payer: BC Managed Care – PPO | Admitting: Family Medicine

## 2021-01-19 ENCOUNTER — Other Ambulatory Visit: Payer: Self-pay

## 2021-01-19 VITALS — BP 130/72 | HR 76 | Temp 98.2°F | Resp 16 | Ht 66.0 in | Wt 201.0 lb

## 2021-01-19 DIAGNOSIS — I739 Peripheral vascular disease, unspecified: Secondary | ICD-10-CM | POA: Diagnosis not present

## 2021-01-19 DIAGNOSIS — I5022 Chronic systolic (congestive) heart failure: Secondary | ICD-10-CM

## 2021-01-19 DIAGNOSIS — Z780 Asymptomatic menopausal state: Secondary | ICD-10-CM

## 2021-01-19 DIAGNOSIS — I10 Essential (primary) hypertension: Secondary | ICD-10-CM

## 2021-01-19 MED ORDER — PROGESTERONE 200 MG PO CAPS: 400.0000 mg | ORAL_CAPSULE | Freq: Every day | ORAL | 1 refills | Status: AC

## 2021-01-19 MED ORDER — ESTRADIOL 2 MG PO TABS: 2.0000 mg | ORAL_TABLET | Freq: Every day | ORAL | 3 refills | Status: AC

## 2021-01-19 MED ORDER — LOSARTAN POTASSIUM 25 MG PO TABS: 25.0000 mg | ORAL_TABLET | Freq: Every day | ORAL | 1 refills | Status: DC

## 2021-01-19 MED ORDER — NP THYROID 120 MG PO TABS: 120.0000 mg | ORAL_TABLET | Freq: Two times a day (BID) | ORAL | 1 refills | Status: DC

## 2021-01-19 NOTE — Patient Instructions (Signed)
I did refill medications temporarily but follow-up with hormone specialist as planned.  I am happy to refer you to endocrinology if needed.  I did refill your losartan but call your cardiologist office to see if you are due for follow-up.  I will see you in a few months but let me know if there are questions sooner.  Thanks for coming in today and take care.

## 2021-01-19 NOTE — Progress Notes (Signed)
Subjective:  Patient ID: Michelle Patton, female    DOB: Jul 08, 1955  Age: 65 y.o. MRN: [id number]  CC:  Chief Complaint  Patient presents with   Medication Refill    Pt here for interim visit until [facility] in December, needs refills today due to Dr [name] passing and being unable to fulfil refills from him     HPI Michelle Patton presents for  New patient to me with medication refills.  Previous patient of Dr. [name].  Reportedly she does have a transition of care visit with me later in the year.  Was started on NP thyroid 120mg  BID,  estradiol 2mg  QD and progesterone  400mg  QD. Plans to assume care for hormone treatment at [facility]. Temporary refill today. Feels better on current regimen.   Followed by cardiology, Dr. Harl Bowie - PAD, chronic systolic CHF, aortic valve prosthesis, HTN.  Needs refill of losartan, other meds have refills. Furosemide as needed recently. No new CP/dyspnea.   Lab Results  Component Value Date   CREATININE 0.88 10/29/2020    History Patient Active Problem List   Diagnosis Date Noted   Elevated LFTs 10/05/2020   Class 2 severe obesity due to excess calories with serious comorbidity and body mass index (BMI) of 36.0 to 36.9 in adult [facility]) 08/09/2020   PAD (peripheral artery disease) ([place]) 08/09/2020   Aortic valve prosthesis present 64/40/3474   Chronic systolic congestive heart failure ([place]) 08/09/2020   OSA and COPD overlap syndrome ([place]) 08/09/2020   s/p right carpal tunnel release 03/02/20 04/06/2020   Closed displaced fracture of proximal phalanx of lesser toe of right foot 12/31/2019   Carpal tunnel syndrome of right wrist 12/05/2019   Nondisplaced fracture of fifth right metatarsal bone with routine healing 10/21/2019   Skin lesion 04/11/2019   Low back pain without sciatica 01/20/2019   Pain of upper abdomen 01/14/2019   Fatigue 12/09/2018   Breast cancer screening 12/09/2018   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 left wrist  10/23/2018   S/P right knee arthroscopy 01/10/18 01/18/2018   Chondromalacia, patella, right    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    Personal history of colonic polyps 12/28/2016   Abdominal pain, epigastric 08/10/2016   Left sided abdominal pain 08/10/2016   Esophageal dysphagia 08/10/2016   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   Supravalvular aortic stenosis, congenital - s/p repair during childhood    Essential hypertension    Hyperlipidemia    GERD (gastroesophageal reflux disease)    Aortic stenosis, severe 07/20/2014   Aortic regurgitation 07/20/2014   Leg cramps 09/26/2012   Occlusion and stenosis of carotid artery without mention of cerebral infarction 07/10/2012   Peripheral vascular disease, unspecified ([place]) 06/12/2012   Carotid artery bruit 06/12/2012   CONSTIPATION 12/28/2009   NAUSEA AND VOMITING 12/28/2009   DIARRHEA 12/28/2009   Past Medical History:  Diagnosis Date   Anxiety    Aortic regurgitation 07/20/2014   Aortic stenosis, severe 07/20/2014   Arthritis    DVT (deep venous thrombosis) (HCC)    Family history of adverse reaction to anesthesia    Reports father deliurm in his 76's with CABG   GERD (gastroesophageal reflux disease)    History of pneumonia    Hyperlipidemia    Hypertension    Insomnia, unspecified    Muscle weakness (generalized)    Peripheral artery disease (HCC)    S/P redo aortic root replacement with stentless porcine aortic  root graft 10/07/2014   Redo sternotomy for 21 mm Medtronic Freestyle porcine aortic root graft w/ reimplantation of left main and right coronary arteries   Sleep apnea    diagnosed multiple years ago at [facility] aortic stenosis, congenital - s/p repair during childhood    Past Surgical History:  Procedure Laterality Date   ABDOMINAL AORTAGRAM  06/24/2012   ABDOMINAL AORTAGRAM N/A 06/24/2012   Procedure: ABDOMINAL Maxcine Ham;  Surgeon:  Angelia Mould, MD;  Location: [facility] CATH LAB;  Service: Cardiovascular;  Laterality: N/A;   AORTIC VALVE REPLACEMENT N/A 10/07/2014   Procedure: REDO AORTIC VALVE REPLACEMENT (AVR);  Surgeon: Rexene Alberts, MD;  Location: [place];  Service: Open Heart Surgery;  Laterality: N/A;   ASCENDING AORTIC ROOT REPLACEMENT N/A 10/07/2014   Procedure: ASCENDING AORTIC ROOT REPLACEMENT;  Surgeon: Rexene Alberts, MD;  Location: [place];  Service: Open Heart Surgery;  Laterality: N/A;   BIOPSY  09/27/2016   Procedure: BIOPSY;  Surgeon: Daneil Dolin, MD;  [facility];  Service: Endoscopy;;  colon   BIOPSY  10/18/2020   Procedure: BIOPSY;  Surgeon: Eloise Harman, DO;  [facility];  Service: Endoscopy;;   BREAST REDUCTION SURGERY Bilateral 01/21/2018   Procedure: BREAST REDUCTION WITH LIPOSUCTION;  Surgeon: Cristine Polio, MD;  Location: [place];  Service: Plastics;  Laterality: Bilateral;   CARDIAC VALVE SURGERY  1968   CARPAL TUNNEL RELEASE Right 03/02/2020   Procedure: CARPAL TUNNEL RELEASE;  Surgeon: Carole Civil, MD;  Location: [facility];  Service: Orthopedics;  Laterality: Right;   CATARACT EXTRACTION W/PHACO Left 05/30/2019   Procedure: CATARACT EXTRACTION PHACO AND INTRAOCULAR LENS PLACEMENT (IOC) (CDE: 4.94  );  Surgeon: Baruch Goldmann, MD;  Location: [facility];  Service: Ophthalmology;  Laterality: Left;   CATARACT EXTRACTION W/PHACO Right 06/16/2019   Procedure: CATARACT EXTRACTION PHACO AND INTRAOCULAR LENS PLACEMENT (IOC);  Surgeon: Baruch Goldmann, MD;  Location: [facility];  Service: Ophthalmology;  Laterality: Right;  CDE: 4.64   CERVICAL FUSION     CHOLECYSTECTOMY     COLONOSCOPY WITH PROPOFOL N/A 09/27/2016   Dr. Gala Romney: Diverticulosis, several tubular adenomas removed ranging 4 to 7 mm in size, internal grade 1 hemorrhoids, terminal ileum normal, segmental biopsies negative for microscopic colitis.  Next colonoscopy June 2021   COLONOSCOPY  WITH PROPOFOL N/A 05/06/2020   internal hemorrhoids, sigmoid diverticulosis. Surveillance colonoscopy due in 2027   ESOPHAGOGASTRODUODENOSCOPY (EGD) WITH PROPOFOL N/A 09/27/2016   Dr. Gala Romney: Small hiatal hernia, mild Schatzki ring status post disruption, LA grade a esophagitis   ESOPHAGOGASTRODUODENOSCOPY (EGD) WITH PROPOFOL N/A 10/18/2020   Procedure: ESOPHAGOGASTRODUODENOSCOPY (EGD) WITH PROPOFOL;  Surgeon: Eloise Harman, DO;  [facility];  Service: Endoscopy;  Laterality: N/A;  8:45am   ILIAC ARTERY STENT Left 12/2007   IR ANGIOGRAM VISCERAL SELECTIVE  10/29/2020   IR RADIOLOGIST EVAL & MGMT  10/26/2020   IR RADIOLOGIST EVAL & MGMT  11/10/2020   IR TRANSCATH PLC STENT 1ST ART NOT LE CV CAR VERT CAR  10/29/2020   IR US GUIDE VASC ACCESS RIGHT  10/29/2020   KNEE ARTHROSCOPY WITH MEDIAL MENISECTOMY Right 01/10/2018   Procedure: RIGHT KNEE ARTHROSCOPY WITH PARTIAL MEDIAL MENISECTOMY;  Surgeon: Carole Civil, MD;  [facility];  Service: Orthopedics;  Laterality: Right;   LEFT AND RIGHT HEART CATHETERIZATION WITH CORONARY ANGIOGRAM N/A 07/31/2014   Procedure: LEFT AND RIGHT HEART CATHETERIZATION WITH CORONARY ANGIOGRAM;  Surgeon: Burnell Blanks,  MD;  Location: [place] CATH LAB;  Service: Cardiovascular;  Laterality: N/A;   MALONEY DILATION N/A 09/27/2016   Procedure: Venia Minks DILATION;  Surgeon: Daneil Dolin, MD;  [facility];  Service: Endoscopy;  Laterality: N/A;   POLYPECTOMY  09/27/2016   Procedure: POLYPECTOMY;  Surgeon: Daneil Dolin, MD;  [facility];  Service: Endoscopy;;  colon   ROTATOR CUFF REPAIR Bilateral    TEE WITHOUT CARDIOVERSION N/A 07/07/2014   Procedure: TRANSESOPHAGEAL ECHOCARDIOGRAM (TEE);  Surgeon: Arnoldo Lenis, MD;  [facility];  Service: Cardiology;  Laterality: N/A;   TEE WITHOUT CARDIOVERSION N/A 07/20/2014   Procedure: TRANSESOPHAGEAL ECHOCARDIOGRAM (TEE) WITH PROPOFOL;  Surgeon: Arnoldo Lenis, MD;   [facility];  Service: Endoscopy;  Laterality: N/A;   TEE WITHOUT CARDIOVERSION N/A 10/07/2014   Procedure: TRANSESOPHAGEAL ECHOCARDIOGRAM (TEE);  Surgeon: Rexene Alberts, MD;  Location: [place];  Service: Open Heart Surgery;  Laterality: N/A;   Allergies  Allergen Reactions   Penicillins Hives    Has patient had a PCN reaction causing immediate rash, facial/tongue/throat swelling, SOB or lightheadedness with hypotension: Yes Has patient had a PCN reaction causing severe rash involving mucus membranes or skin necrosis: No Has patient had a PCN reaction that required hospitalization No Has patient had a PCN reaction occurring within the last 10 years: No If all of the above answers are "NO", then may proceed with Cephalosporin use.  Have taken Keflex before without any adverse reaction.    Sulfa Antibiotics Nausea And Vomiting   Prior to Admission medications   Medication Sig Start Date End Date Taking? Authorizing Provider  aspirin EC 81 MG tablet Take 1 tablet (81 mg total) by mouth daily. Swallow whole. 12/16/19  Yes BranchAlphonse Guild, MD  clopidogrel (PLAVIX) 75 MG tablet Take 1 tablet (75 mg total) by mouth daily. 10/30/20  Yes Suttle, Rosanne Ashing, MD  diclofenac (VOLTAREN) 75 MG EC tablet TAKE 1 TABLET TWICE A DAY (DO NOT CRUSH) Patient taking differently: Take 75 mg by mouth 2 (two) times daily. 02/16/20  Yes Carole Civil, MD  estradiol (ESTRACE) 2 MG tablet Take 1 tablet (2 mg total) by mouth daily. 09/02/20  Yes Gosrani, Nimish C, MD  fidaxomicin (DIFICID) 200 MG TABS tablet Take 200 mg by mouth 2 (two) times daily.   Yes [provider]  losartan (COZAAR) 25 MG tablet Take 1 tablet (25 mg total) by mouth daily. 01/10/21  Yes Branch, Alphonse Guild, MD  metoprolol tartrate (LOPRESSOR) 25 MG tablet Take 1 tablet (25 mg total) by mouth 2 (two) times daily. 04/05/20  Yes BranchAlphonse Guild, MD  NP THYROID 120 MG tablet Take 1 tablet (120 mg total) by mouth in the morning and at  bedtime. 11/09/20  Yes Gosrani, Nimish C, MD  progesterone (PROMETRIUM) 200 MG capsule Take 2 capsules (400 mg total) by mouth daily. 12/14/20 03/14/21 Yes Ailene Ards, NP  RABEprazole (ACIPHEX) 20 MG tablet Take 20 mg by mouth daily.   Yes [provider]  furosemide (LASIX) 40 MG tablet Take 1 tablet (40 mg total) by mouth daily as needed for edema. Patient taking differently: Take 40 mg by mouth daily as needed for fluid. 10/23/17 11/09/20  Arnoldo Lenis, MD   Social History   Socioeconomic History   Marital status: Married    Spouse name: Not on file   Number of children: 0   Years of education: some coll   Highest education  level: Not on file  Occupational History   Occupation: call center    Employer: [employer]    Comment: [employer]  Tobacco Use   Smoking status: Every Day    Packs/day: 0.50    Years: 40.00    Pack years: 20.00    Types: Cigarettes   Smokeless tobacco: Never  Vaping Use   Vaping Use: Never used  Substance and Sexual Activity   Alcohol use: Yes    Alcohol/week: 2.0 standard drinks    Types: 2 Cans of beer per week   Drug use: No   Sexual activity: Yes    Birth control/protection: None  Other Topics Concern   Not on file  Social History Narrative   Patient is married   for 5 years . Patient works at [employer].. Some college education-did not Writer.Originally from [place].   Social Determinants of Health   Financial Resource Strain: Not on file  Food Insecurity: Not on file  Transportation Needs: Not on file  Physical Activity: Not on file  Stress: Not on file  Social Connections: Not on file  Intimate Partner Violence: Not on file    Review of Systems   Objective:   Vitals:   01/19/21 1542  BP: 130/72  Pulse: 76  Resp: 16  Temp: 98.2 F (36.8 C)  TempSrc: Temporal  SpO2: 98%  Weight: 201 lb (91.2 kg)  Height: 5\' 6"  (1.676 m)     Physical Exam Vitals reviewed.  Constitutional:      Appearance:  Normal appearance. She is well-developed.  HENT:     Head: Normocephalic and atraumatic.  Eyes:     Conjunctiva/sclera: Conjunctivae normal.     Pupils: Pupils are equal, round, and reactive to light.  Neck:     Vascular: No carotid bruit.  Cardiovascular:     Rate and Rhythm: Normal rate and regular rhythm.     Heart sounds: Normal heart sounds.  Pulmonary:     Effort: Pulmonary effort is normal.     Breath sounds: Normal breath sounds.  Abdominal:     Palpations: Abdomen is soft. There is no pulsatile mass.     Tenderness: There is no abdominal tenderness.  Musculoskeletal:     Right lower leg: No edema.     Left lower leg: No edema.  Skin:    General: Skin is warm and dry.  Neurological:     Mental Status: She is alert and oriented to person, place, and time.  Psychiatric:        Mood and Affect: Mood normal.        Behavior: Behavior normal.     Assessment & Plan:  Michelle Patton is a 65 y.o. female . Essential hypertension - Plan: losartan (COZAAR) 25 MG tablet PAD (peripheral artery disease) (HCC) Chronic systolic congestive heart failure ([place]) - Plan: losartan (COZAAR) 25 MG tablet  -Continue ongoing care with cardiology, losartan refilled for now.  Appears euvolemic in office.  Menopause - Plan: progesterone (PROMETRIUM) 200 MG capsule, NP THYROID 120 MG tablet, estradiol (ESTRACE) 2 MG tablet  -Temporary refill provided until she can establish with new provider for hormone management.  Plans to seek care through [facility].  Option for endocrinology referral given as well.  Meds ordered this encounter  Medications   progesterone (PROMETRIUM) 200 MG capsule    Sig: Take 2 capsules (400 mg total) by mouth daily.    Dispense:  180 capsule    Refill:  1   NP  THYROID 120 MG tablet    Sig: Take 1 tablet (120 mg total) by mouth in the morning and at bedtime.    Dispense:  180 tablet    Refill:  1   estradiol (ESTRACE) 2 MG tablet    Sig: Take 1 tablet (2 mg total) by  mouth daily.    Dispense:  30 tablet    Refill:  3   losartan (COZAAR) 25 MG tablet    Sig: Take 1 tablet (25 mg total) by mouth daily.    Dispense:  90 tablet    Refill:  1   Patient Instructions  I did refill medications temporarily but follow-up with hormone specialist as planned.  I am happy to refer you to endocrinology if needed.  I did refill your losartan but call your cardiologist office to see if you are due for follow-up.  I will see you in a few months but let me know if there are questions sooner.  Thanks for coming in today and take care.    Signed,   Merri Ray, MD [place], [facility] 01/19/21 4:33 PM

## 2021-01-20 ENCOUNTER — Encounter: Payer: Self-pay | Admitting: Family Medicine

## 2021-01-24 ENCOUNTER — Ambulatory Visit (INDEPENDENT_AMBULATORY_CARE_PROVIDER_SITE_OTHER): Payer: BC Managed Care – PPO | Admitting: Internal Medicine

## 2021-02-06 ENCOUNTER — Other Ambulatory Visit: Payer: Self-pay | Admitting: Gastroenterology

## 2021-02-08 ENCOUNTER — Ambulatory Visit (HOSPITAL_COMMUNITY)
Admission: RE | Admit: 2021-02-08 | Discharge: 2021-02-08 | Disposition: A | Discharge status: Home / Self Care | Payer: BC Managed Care – PPO | Source: Ambulatory Visit | Attending: Interventional Radiology | Admitting: Interventional Radiology

## 2021-02-08 ENCOUNTER — Other Ambulatory Visit: Payer: Self-pay

## 2021-02-08 DIAGNOSIS — K559 Vascular disorder of intestine, unspecified: Secondary | ICD-10-CM | POA: Insufficient documentation

## 2021-02-08 LAB — POCT I-STAT CREATININE: Creatinine, Ser: 0.7 mg/dL (ref 0.44–1.00)

## 2021-02-08 MED ORDER — IOHEXOL 350 MG/ML SOLN
100.0000 mL | Freq: Once | INTRAVENOUS | Status: AC | PRN
  Administered 2021-02-08: 100 mL via INTRAVENOUS

## 2021-02-09 ENCOUNTER — Ambulatory Visit
Admission: RE | Admit: 2021-02-09 | Discharge: 2021-02-09 | Disposition: A | Discharge status: Home / Self Care | Payer: BC Managed Care – PPO | Source: Ambulatory Visit | Attending: Interventional Radiology | Admitting: Interventional Radiology

## 2021-02-09 ENCOUNTER — Other Ambulatory Visit: Payer: Self-pay | Admitting: Interventional Radiology

## 2021-02-09 ENCOUNTER — Encounter: Payer: Self-pay | Admitting: *Deleted

## 2021-02-09 DIAGNOSIS — K559 Vascular disorder of intestine, unspecified: Secondary | ICD-10-CM

## 2021-02-09 HISTORY — PX: IR RADIOLOGIST EVAL & MGMT: IMG5224

## 2021-02-09 MED ORDER — CLOPIDOGREL BISULFATE 75 MG PO TABS: 75.0000 mg | ORAL_TABLET | Freq: Every day | ORAL | Status: AC

## 2021-02-09 NOTE — Progress Notes (Signed)
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   She is now status post SMA covered balloon-expandable stent placement on 10/29/20.  She presents today via virtual telephone visit.  CTA abdomen pelvis was performed on 02/09/21 which demonstrates wide patency of the SMA stent and moderate stenosis of the native SMA for approximately 1 cm distal to the stent.   The SMA is otherwise widely patent.  She complains of occasional transient "twinges" of abdominal discomfort, but has experienced no pain like prior to the procedure.  She has no post-prandial pain.  She is tolerating a normal diet.  She continues aspirin and plavix without abnormal bleeding.  She endorses some calf cramping pain with walking.   Past Medical History:  Diagnosis Date   Anxiety    Aortic regurgitation 07/20/2014   Aortic stenosis, severe 07/20/2014   Arthritis    DVT (deep venous thrombosis) (HCC)    Family history of adverse reaction to anesthesia    Reports father deliurm in his 24's with CABG   GERD (gastroesophageal reflux disease)    History of pneumonia    Hyperlipidemia    Hypertension    Insomnia, unspecified    Muscle weakness (generalized)    Peripheral artery disease (HCC)    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   Redo sternotomy for 21 mm Medtronic Freestyle porcine aortic root graft w/ reimplantation of left main and right coronary arteries   Sleep apnea    diagnosed multiple years ago at [facility] aortic stenosis, congenital - s/p repair during childhood     Past Surgical History:  Procedure Laterality Date   ABDOMINAL AORTAGRAM  06/24/2012   ABDOMINAL AORTAGRAM N/A 06/24/2012   Procedure:  ABDOMINAL Maxcine Ham;  Surgeon: Angelia Mould, MD;  Location: [facility] CATH LAB;  Service: Cardiovascular;  Laterality: N/A;   AORTIC VALVE REPLACEMENT N/A 10/07/2014   Procedure: REDO AORTIC VALVE REPLACEMENT (AVR);  Surgeon: Rexene Alberts, MD;  Location: [place];  Service: Open Heart Surgery;  Laterality: N/A;   ASCENDING AORTIC ROOT REPLACEMENT N/A 10/07/2014   Procedure: ASCENDING AORTIC ROOT REPLACEMENT;  Surgeon: Rexene Alberts, MD;  Location: [place];  Service: Open Heart Surgery;  Laterality: N/A;   BIOPSY  09/27/2016   Procedure: BIOPSY;  Surgeon: Daneil Dolin, MD;  [facility];  Service: Endoscopy;;  colon   BIOPSY  10/18/2020   Procedure: BIOPSY;  Surgeon: Eloise Harman, DO;  [facility];  Service: Endoscopy;;   BREAST REDUCTION SURGERY Bilateral 01/21/2018   Procedure: BREAST REDUCTION WITH LIPOSUCTION;  Surgeon: Cristine Polio, MD;  Location: [place];  Service: Plastics;  Laterality: Bilateral;   CARDIAC VALVE SURGERY  1968   CARPAL TUNNEL RELEASE Right 03/02/2020   Procedure: CARPAL TUNNEL RELEASE;  Surgeon: Carole Civil, MD;  [facility];  Service: Orthopedics;  Laterality: Right;   CATARACT EXTRACTION W/PHACO Left 05/30/2019   Procedure: CATARACT EXTRACTION PHACO AND INTRAOCULAR LENS PLACEMENT (IOC) (CDE: 4.94  );  Surgeon: Baruch Goldmann, MD;  [facility]: [facility];  Service: Ophthalmology;  Laterality: Left;   CATARACT EXTRACTION W/PHACO Right 06/16/2019   Procedure: CATARACT EXTRACTION PHACO AND INTRAOCULAR LENS PLACEMENT (IOC);  Surgeon: Baruch Goldmann, MD;  [facility];  Service: Ophthalmology;  Laterality: Right;  CDE: 4.64   CERVICAL FUSION     CHOLECYSTECTOMY     COLONOSCOPY WITH PROPOFOL N/A 09/27/2016   Dr. Gala Romney: Diverticulosis, several tubular adenomas removed ranging 4 to 7 mm in size, internal grade 1 hemorrhoids, terminal ileum normal, segmental biopsies negative for microscopic colitis.  Next  colonoscopy June 2021   COLONOSCOPY WITH PROPOFOL N/A 05/06/2020   internal hemorrhoids, sigmoid diverticulosis. Surveillance colonoscopy due in 2027   ESOPHAGOGASTRODUODENOSCOPY (EGD) WITH PROPOFOL N/A 09/27/2016   Dr. Gala Romney: Small hiatal hernia, mild Schatzki ring status post disruption, LA grade a esophagitis   ESOPHAGOGASTRODUODENOSCOPY (EGD) WITH PROPOFOL N/A 10/18/2020   Procedure: ESOPHAGOGASTRODUODENOSCOPY (EGD) WITH PROPOFOL;  Surgeon: Eloise Harman, DO;  [facility];  Service: Endoscopy;  Laterality: N/A;  8:45am   ILIAC ARTERY STENT Left 12/2007   IR ANGIOGRAM VISCERAL SELECTIVE  10/29/2020   IR RADIOLOGIST EVAL & MGMT  10/26/2020   IR RADIOLOGIST EVAL & MGMT  11/10/2020   IR TRANSCATH PLC STENT 1ST ART NOT LE CV CAR VERT CAR  10/29/2020   IR US GUIDE VASC ACCESS RIGHT  10/29/2020   KNEE ARTHROSCOPY WITH MEDIAL MENISECTOMY Right 01/10/2018   Procedure: RIGHT KNEE ARTHROSCOPY WITH PARTIAL MEDIAL MENISECTOMY;  Surgeon: Carole Civil, MD;  [facility];  Service: Orthopedics;  Laterality: Right;   LEFT AND RIGHT HEART CATHETERIZATION WITH CORONARY ANGIOGRAM N/A 07/31/2014   Procedure: LEFT AND RIGHT HEART CATHETERIZATION WITH CORONARY ANGIOGRAM;  Surgeon: Burnell Blanks, MD;  Location: [facility] CATH LAB;  Service: Cardiovascular;  Laterality: N/A;   MALONEY DILATION N/A 09/27/2016   Procedure: Venia Minks DILATION;  Surgeon: Daneil Dolin, MD;  [facility];  Service: Endoscopy;  Laterality: N/A;   POLYPECTOMY  09/27/2016   Procedure: POLYPECTOMY;  Surgeon: Daneil Dolin, MD;  [facility];  Service: Endoscopy;;  colon   ROTATOR CUFF REPAIR Bilateral    TEE WITHOUT CARDIOVERSION N/A 07/07/2014   Procedure: TRANSESOPHAGEAL ECHOCARDIOGRAM (TEE);  Surgeon: Arnoldo Lenis, MD;  [facility];  Service: Cardiology;  Laterality: N/A;   TEE WITHOUT CARDIOVERSION N/A 07/20/2014   Procedure: TRANSESOPHAGEAL ECHOCARDIOGRAM (TEE) WITH  PROPOFOL;  Surgeon: Arnoldo Lenis, MD;  [facility];  Service: Endoscopy;  Laterality: N/A;   TEE WITHOUT CARDIOVERSION N/A 10/07/2014   Procedure: TRANSESOPHAGEAL ECHOCARDIOGRAM (TEE);  Surgeon: Rexene Alberts, MD;  Location: [place];  Service: Open Heart Surgery;  Laterality: N/A;    Allergies: Penicillins and Sulfa antibiotics  Medications: Prior to Admission medications   Medication Sig Start Date End Date Taking? Authorizing Provider  aspirin EC 81 MG tablet Take 1 tablet (81 mg total) by mouth daily. Swallow whole. 12/16/19   Arnoldo Lenis, MD  clopidogrel (PLAVIX) 75 MG tablet Take 1 tablet (75 mg total) by mouth daily. 10/30/20   Cherith Tewell, Rosanne Ashing, MD  diclofenac (VOLTAREN) 75 MG EC tablet TAKE 1 TABLET TWICE A DAY (DO NOT CRUSH) Patient taking differently: Take 75 mg by mouth 2 (two) times daily. 02/16/20   Carole Civil, MD  estradiol (ESTRACE) 2 MG tablet Take 1 tablet (2 mg total) by mouth daily. 01/19/21   Wendie Agreste, MD  fidaxomicin (DIFICID) 200 MG TABS tablet Take 200 mg by mouth 2 (two) times daily.    [provider]  furosemide (LASIX) 40 MG tablet Take 1 tablet (40 mg total) by mouth daily as needed for edema. Patient taking  differently: Take 40 mg by mouth daily as needed for fluid. 10/23/17 11/09/20  Arnoldo Lenis, MD  losartan (COZAAR) 25 MG tablet Take 1 tablet (25 mg total) by mouth daily. 01/19/21   Wendie Agreste, MD  metoprolol tartrate (LOPRESSOR) 25 MG tablet Take 1 tablet (25 mg total) by mouth 2 (two) times daily. 04/05/20   Arnoldo Lenis, MD  NP THYROID 120 MG tablet Take 1 tablet (120 mg total) by mouth in the morning and at bedtime. 01/19/21   Wendie Agreste, MD  progesterone (PROMETRIUM) 200 MG capsule Take 2 capsules (400 mg total) by mouth daily. 01/19/21 04/19/21  Wendie Agreste, MD  RABEprazole (ACIPHEX) 20 MG tablet TAKE ONE TABLET BY MOUTH TWICE A DAY BEFORE A MEAL 02/09/21   Annitta Needs, NP     Family  History  Problem Relation Age of Onset   Hypertension Mother    Hypertension Father    Heart disease Father        before age 24   Other Father        varicose veins   COPD Father    Colon cancer Neg Hx    Celiac disease Neg Hx    Inflammatory bowel disease Neg Hx     Social History   Socioeconomic History   Marital status: Married    Spouse name: Not on file   Number of children: 0   Years of education: some coll   Highest education level: Not on file  Occupational History   Occupation: call center    Employer: [employer]    Comment: [employer]  Tobacco Use   Smoking status: Every Day    Packs/day: 0.50    Years: 40.00    Pack years: 20.00    Types: Cigarettes   Smokeless tobacco: Never  Vaping Use   Vaping Use: Never used  Substance and Sexual Activity   Alcohol use: Yes    Alcohol/week: 2.0 standard drinks    Types: 2 Cans of beer per week   Drug use: No   Sexual activity: Yes    Birth control/protection: None  Other Topics Concern   Not on file  Social History Narrative   Patient is married   for 5 years . Patient works at [employer].. Some college education-did not Writer.Originally from [place].   Social Determinants of Health   Financial Resource Strain: Not on file  Food Insecurity: Not on file  Transportation Needs: Not on file  Physical Activity: Not on file  Stress: Not on file  Social Connections: Not on file     Vital Signs: There were no vitals taken for this visit.  No physical examination performed in lieu of telephone visit.  Imaging: CTA [facility] 02/09/21    Labs:  CBC: Recent Labs    09/22/20 1637 09/26/20 1423 10/27/20 1234 10/29/20 0705  WBC 7.3 5.9 7.0 5.6  HGB 12.0 12.5 13.2 13.0  HCT 36.1 37.0 39.8 38.8  PLT 275 263 251 227    COAGS: Recent Labs    10/29/20 0705  INR 1.0    BMP: Recent Labs    09/22/20 1637 09/26/20 1423 10/27/20 1234 10/29/20 0705 02/08/21 0813  NA 138 137 138 138  --   K  4.2 4.3 4.1 3.7  --   CL 105 107 106 106  --   CO2 25 24 23 26   --   GLUCOSE 101 126* 104* 111*  --   BUN 15  16 10 8   --   CALCIUM 9.0 9.2 9.5 9.5  --   CREATININE 0.77 1.05* 0.69 0.88 0.70  GFRNONAA 82 60* >60 >60  --   GFRAA 95  --   --   --   --     LIVER FUNCTION TESTS: Recent Labs    09/22/20 1637 09/26/20 1423 10/05/20 1550 10/27/20 1234  BILITOT 0.3 0.6 0.3 0.7  AST 17 45* 19 26  ALT 18 46* 20 26  ALKPHOS  --  55  --  63  PROT 6.4 6.8 6.4 6.7  ALBUMIN  --  3.6  --  3.6    Assessment and Plan: 65 year old female with history of atherosclerotic disease including peripheral artery disease and chronic mesenteric ischemia now status post superior mesenteric artery covered stent placement on 10/29/20.  Her pain is resolved and she is tolerating a normal diet.  She is tolerating dual antiplatelet therapy.  She does complain of occasional calf cramping (previously seen by Dr. Scot Dock s/p left EIA stent placement in 2014, no recent follow up) and wishes to establish care with me for surveillance of her PAD.   -continue aspirin 81 mg  -continue Plavix 75 mg daily for 3 more months - if residual SMA stenosis is unchanged, will discontinue -follow up in 3 months with CTA abdomen/pelvis  -obtain ABIs before 3 month follow up  Electronically Signed: Rosanne Ashing Yoselyn Mcglade 02/09/2021, 9:34 AM   I spent a total of 40 Minutes in telephone clinical consultation, greater than 50% of which was counseling/coordinating care for chronic mesenteric ischemia

## 2021-02-10 ENCOUNTER — Other Ambulatory Visit: Payer: Self-pay | Admitting: Orthopedic Surgery

## 2021-02-10 DIAGNOSIS — M1712 Unilateral primary osteoarthritis, left knee: Secondary | ICD-10-CM

## 2021-02-16 ENCOUNTER — Ambulatory Visit: Payer: BC Managed Care – PPO | Admitting: Gastroenterology

## 2021-02-22 ENCOUNTER — Encounter: Payer: Self-pay | Admitting: Gastroenterology

## 2021-02-22 ENCOUNTER — Ambulatory Visit: Payer: BC Managed Care – PPO | Admitting: Gastroenterology

## 2021-02-22 ENCOUNTER — Other Ambulatory Visit: Payer: Self-pay

## 2021-02-22 VITALS — BP 144/74 | HR 80 | Temp 97.3°F | Ht 66.0 in | Wt 206.6 lb

## 2021-02-22 DIAGNOSIS — K219 Gastro-esophageal reflux disease without esophagitis: Secondary | ICD-10-CM | POA: Diagnosis not present

## 2021-02-22 DIAGNOSIS — K551 Chronic vascular disorders of intestine: Secondary | ICD-10-CM

## 2021-02-22 NOTE — Patient Instructions (Signed)
Please call us or Vascular if you have any changes in your symptoms.  If you have acute abdominal pain that is severe, go to the nearest emergency room.  We will see you in 6 months or sooner if needed!  I enjoyed seeing you again today! As you know, I value our relationship and want to provide genuine, compassionate, and quality care. I welcome your feedback. If you receive a survey regarding your visit,  I greatly appreciate you taking time to fill this out. See you next time!  Annitta Needs, PhD, ANP-BC [facility]

## 2021-02-22 NOTE — Progress Notes (Signed)
Referring Provider: Doree Albee, MD Primary Care Physician:  Wendie Agreste, MD Primary GI: Dr. Gala Romney   Chief Complaint  Patient presents with   Abdominal Pain    twinges    HPI:   Michelle Patton is a 65 y.o. female presenting today with a history of chronic mesenteric ischemia with severe, long segment stenosis of SMA and moderate focal stenosis of celiac, s/p SMA covered balloon-expandable stent placement to SMA October 29, 2020. CTA  Oct 2022 showed patency of SMA stent and moderate stenosis of native SMA distal to the stent.   She was also found to have Cdiff in June 2022 and treated successfully. She now takes fiber for productive stools. Occasional cramping but not like it was. If doesn't take fiber pills, will have explosive stool. Abdominal "twinges" of discomfort. Vascular is aware and has completed a visit recently.    Continues on Aciphex BID.   Past Medical History:  Diagnosis Date   Anxiety    Aortic regurgitation 07/20/2014   Aortic stenosis, severe 07/20/2014   Arthritis    DVT (deep venous thrombosis) (HCC)    Family history of adverse reaction to anesthesia    Reports father deliurm in his 32's with CABG   GERD (gastroesophageal reflux disease)    History of pneumonia    Hyperlipidemia    Hypertension    Insomnia, unspecified    Muscle weakness (generalized)    Peripheral artery disease (HCC)    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   Redo sternotomy for 21 mm Medtronic Freestyle porcine aortic root graft w/ reimplantation of left main and right coronary arteries   Sleep apnea    diagnosed multiple years ago at [facility] aortic stenosis, congenital - s/p repair during childhood     Past Surgical History:  Procedure Laterality Date   ABDOMINAL AORTAGRAM  06/24/2012   ABDOMINAL AORTAGRAM N/A 06/24/2012   Procedure: ABDOMINAL Maxcine Ham;  Surgeon: Angelia Mould, MD;  Location: [facility] CATH  LAB;  Service: Cardiovascular;  Laterality: N/A;   AORTIC VALVE REPLACEMENT N/A 10/07/2014   Procedure: REDO AORTIC VALVE REPLACEMENT (AVR);  Surgeon: Rexene Alberts, MD;  Location: [place];  Service: Open Heart Surgery;  Laterality: N/A;   ASCENDING AORTIC ROOT REPLACEMENT N/A 10/07/2014   Procedure: ASCENDING AORTIC ROOT REPLACEMENT;  Surgeon: Rexene Alberts, MD;  Location: [place];  Service: Open Heart Surgery;  Laterality: N/A;   BIOPSY  09/27/2016   Procedure: BIOPSY;  Surgeon: Daneil Dolin, MD;  [facility];  Service: Endoscopy;;  colon   BIOPSY  10/18/2020   Procedure: BIOPSY;  Surgeon: Eloise Harman, DO;  [facility];  Service: Endoscopy;;   BREAST REDUCTION SURGERY Bilateral 01/21/2018   Procedure: BREAST REDUCTION WITH LIPOSUCTION;  Surgeon: Cristine Polio, MD;  Location: [place];  Service: Plastics;  Laterality: Bilateral;   CARDIAC VALVE SURGERY  1968   CARPAL TUNNEL RELEASE Right 03/02/2020   Procedure: CARPAL TUNNEL RELEASE;  Surgeon: Carole Civil, MD;  Location: [facility];  Service: Orthopedics;  Laterality: Right;   CATARACT EXTRACTION W/PHACO Left 05/30/2019   Procedure: CATARACT EXTRACTION PHACO AND INTRAOCULAR LENS PLACEMENT (IOC) (CDE: 4.94  );  Surgeon: Baruch Goldmann, MD;  Location: [facility];  Service: Ophthalmology;  Laterality: Left;   CATARACT EXTRACTION W/PHACO Right 06/16/2019   Procedure: CATARACT EXTRACTION PHACO AND INTRAOCULAR LENS PLACEMENT (IOC);  Surgeon: Baruch Goldmann, MD;  [facility]: [facility];  Service: Ophthalmology;  Laterality: Right;  CDE: 4.64   CERVICAL FUSION     CHOLECYSTECTOMY     COLONOSCOPY WITH PROPOFOL N/A 09/27/2016   Dr. Gala Romney: Diverticulosis, several tubular adenomas removed ranging 4 to 7 mm in size, internal grade 1 hemorrhoids, terminal ileum normal, segmental biopsies negative for microscopic colitis.  Next colonoscopy June 2021   COLONOSCOPY WITH PROPOFOL N/A 05/06/2020   internal  hemorrhoids, sigmoid diverticulosis. Surveillance colonoscopy due in 2027   ESOPHAGOGASTRODUODENOSCOPY (EGD) WITH PROPOFOL N/A 09/27/2016   Dr. Gala Romney: Small hiatal hernia, mild Schatzki ring status post disruption, LA grade a esophagitis   ESOPHAGOGASTRODUODENOSCOPY (EGD) WITH PROPOFOL N/A 10/18/2020   non-obstructing mild Schatzki ring, gastritis s/ biopsy. Negative H.pylori.   ILIAC ARTERY STENT Left 12/2007   IR ANGIOGRAM VISCERAL SELECTIVE  10/29/2020   IR RADIOLOGIST EVAL & MGMT  10/26/2020   IR RADIOLOGIST EVAL & MGMT  11/10/2020   IR RADIOLOGIST EVAL & MGMT  02/09/2021   IR TRANSCATH PLC STENT 1ST ART NOT LE CV CAR VERT CAR  10/29/2020   IR US GUIDE VASC ACCESS RIGHT  10/29/2020   KNEE ARTHROSCOPY WITH MEDIAL MENISECTOMY Right 01/10/2018   Procedure: RIGHT KNEE ARTHROSCOPY WITH PARTIAL MEDIAL MENISECTOMY;  Surgeon: Carole Civil, MD;  [facility]: [facility];  Service: Orthopedics;  Laterality: Right;   LEFT AND RIGHT HEART CATHETERIZATION WITH CORONARY ANGIOGRAM N/A 07/31/2014   Procedure: LEFT AND RIGHT HEART CATHETERIZATION WITH CORONARY ANGIOGRAM;  Surgeon: Burnell Blanks, MD;  [facility] CATH LAB;  Service: Cardiovascular;  Laterality: N/A;   MALONEY DILATION N/A 09/27/2016   Procedure: Venia Minks DILATION;  Surgeon: Daneil Dolin, MD;  [facility];  Service: Endoscopy;  Laterality: N/A;   POLYPECTOMY  09/27/2016   Procedure: POLYPECTOMY;  Surgeon: Daneil Dolin, MD;  [facility];  Service: Endoscopy;;  colon   ROTATOR CUFF REPAIR Bilateral    TEE WITHOUT CARDIOVERSION N/A 07/07/2014   Procedure: TRANSESOPHAGEAL ECHOCARDIOGRAM (TEE);  Surgeon: Arnoldo Lenis, MD;  [facility];  Service: Cardiology;  Laterality: N/A;   TEE WITHOUT CARDIOVERSION N/A 07/20/2014   Procedure: TRANSESOPHAGEAL ECHOCARDIOGRAM (TEE) WITH PROPOFOL;  Surgeon: Arnoldo Lenis, MD;  Location: [facility];  Service: Endoscopy;  Laterality: N/A;   TEE WITHOUT  CARDIOVERSION N/A 10/07/2014   Procedure: TRANSESOPHAGEAL ECHOCARDIOGRAM (TEE);  Surgeon: Rexene Alberts, MD;  Location: [place];  Service: Open Heart Surgery;  Laterality: N/A;    Current Outpatient Medications  Medication Sig Dispense Refill   aspirin EC 81 MG tablet Take 1 tablet (81 mg total) by mouth daily. Swallow whole. 90 tablet 3   clopidogrel (PLAVIX) 75 MG tablet Take 1 tablet (75 mg total) by mouth daily. 90 tablet 0   diclofenac (VOLTAREN) 75 MG EC tablet Take 1 tablet (75 mg total) by mouth 2 (two) times daily. 60 tablet 0   estradiol (ESTRACE) 2 MG tablet Take 1 tablet (2 mg total) by mouth daily. 30 tablet 3   furosemide (LASIX) 40 MG tablet Take 1 tablet (40 mg total) by mouth daily as needed for edema. (Patient taking differently: Take 40 mg by mouth daily as needed for fluid.) 90 tablet 3   losartan (COZAAR) 25 MG tablet Take 1 tablet (25 mg total) by mouth daily. 90 tablet 1   metoprolol tartrate (LOPRESSOR) 25 MG tablet Take 1 tablet (25 mg total) by mouth 2 (two) times daily. 180 tablet 3   NP THYROID 120 MG  tablet Take 1 tablet (120 mg total) by mouth in the morning and at bedtime. (Patient taking differently: Take 120 mg by mouth daily.) 180 tablet 1   progesterone (PROMETRIUM) 200 MG capsule Take 2 capsules (400 mg total) by mouth daily. 180 capsule 1   RABEprazole (ACIPHEX) 20 MG tablet TAKE ONE TABLET BY MOUTH TWICE A DAY BEFORE A MEAL 180 tablet 3   Current Facility-Administered Medications  Medication Dose Route Frequency Provider Last Rate Last Admin   clopidogrel (PLAVIX) tablet 75 mg  75 mg Oral Daily Suttle, Rosanne Ashing, MD        Allergies as of 02/22/2021 - Review Complete 02/22/2021  Allergen Reaction Noted   Penicillins Hives    Sulfa antibiotics Nausea And Vomiting 06/19/2012    Family History  Problem Relation Age of Onset   Hypertension Mother    Hypertension Father    Heart disease Father        before age 71   Other Father        varicose veins    COPD Father    Colon cancer Neg Hx    Celiac disease Neg Hx    Inflammatory bowel disease Neg Hx     Social History   Socioeconomic History   Marital status: Married    Spouse name: Not on file   Number of children: 0   Years of education: some coll   Highest education level: Not on file  Occupational History   Occupation: call center    Employer: [employer]    Comment: [employer]  Tobacco Use   Smoking status: Every Day    Packs/day: 0.50    Years: 40.00    Pack years: 20.00    Types: Cigarettes   Smokeless tobacco: Never  Vaping Use   Vaping Use: Never used  Substance and Sexual Activity   Alcohol use: Yes    Alcohol/week: 2.0 standard drinks    Types: 2 Cans of beer per week   Drug use: No   Sexual activity: Yes    Birth control/protection: None  Other Topics Concern   Not on file  Social History Narrative   Patient is married   for 5 years . Patient works at [employer].. Some college education-did not Writer.Originally from [place].   Social Determinants of Health   Financial Resource Strain: Not on file  Food Insecurity: Not on file  Transportation Needs: Not on file  Physical Activity: Not on file  Stress: Not on file  Social Connections: Not on file    Review of Systems: Gen: Denies fever, chills, anorexia. Denies fatigue, weakness, weight loss.  CV: Denies chest pain, palpitations, syncope, peripheral edema, and claudication. Resp: Denies dyspnea at rest, cough, wheezing, coughing up blood, and pleurisy. GI: see HPI Derm: Denies rash, itching, dry skin Psych: Denies depression, anxiety, memory loss, confusion. No homicidal or suicidal ideation.  Heme: Denies bruising, bleeding, and enlarged lymph nodes.  Physical Exam: BP (!) 144/74   Pulse 80   Temp (!) 97.3 F (36.3 C) (Temporal)   Ht 5\' 6"  (1.676 m)   Wt 206 lb 9.6 oz (93.7 kg)   BMI 33.35 kg/m  General:   Alert and oriented. No distress noted. Pleasant and cooperative.   Head:  Normocephalic and atraumatic. Eyes:  Conjuctiva clear without scleral icterus. Mouth:  mask in place Abdomen:  +BS, soft, mild TTP diffusely and non-distended. No rebound or guarding. No HSM or masses noted. Msk:  Symmetrical without gross  deformities. Normal posture. Extremities:  Without edema. Neurologic:  Alert and  oriented x4 Psych:  Alert and cooperative. Normal mood and affect.  ASSESSMENT/PLAN: Michelle Patton is a 65 y.o. female presenting today  with a history of chronic mesenteric ischemia with severe, long segment stenosis of SMA and moderate focal stenosis of celiac, s/p SMA covered balloon-expandable stent placement to SMA October 29, 2020. CTA  Oct 2022 showed patency of SMA stent and moderate stenosis of native SMA distal to the stent.   Clinically much improved. Continues to follow closely with Vascular. Aciphex BID is controlling GERD symptoms.  We discussed signs/symptoms that would prompt immediate/urgent evaluation.  We will see her in 6 months or sooner if needed.  Annitta Needs, PhD, ANP-BC [facility]

## 2021-02-23 ENCOUNTER — Other Ambulatory Visit: Payer: Self-pay | Admitting: Radiology

## 2021-02-23 MED ORDER — CLOPIDOGREL BISULFATE 75 MG PO TABS: 75.0000 mg | ORAL_TABLET | Freq: Every day | ORAL | 0 refills | Status: DC

## 2021-03-01 ENCOUNTER — Other Ambulatory Visit: Payer: Self-pay | Admitting: Orthopedic Surgery

## 2021-03-01 DIAGNOSIS — M1712 Unilateral primary osteoarthritis, left knee: Secondary | ICD-10-CM

## 2021-03-14 ENCOUNTER — Ambulatory Visit: Payer: BC Managed Care – PPO | Admitting: Neurology

## 2021-03-22 ENCOUNTER — Telehealth: Payer: Self-pay | Admitting: Radiology

## 2021-03-22 NOTE — Telephone Encounter (Signed)
Per order of Dr. Serafina Royals, prescription for Plavix 75 mg daily for 3 months was called into pt's pharmacy [facility] at [phone].

## 2021-03-25 ENCOUNTER — Other Ambulatory Visit: Payer: Self-pay

## 2021-03-25 ENCOUNTER — Encounter: Payer: Self-pay | Admitting: Cardiology

## 2021-03-25 ENCOUNTER — Ambulatory Visit (INDEPENDENT_AMBULATORY_CARE_PROVIDER_SITE_OTHER): Payer: BC Managed Care – PPO | Admitting: Cardiology

## 2021-03-25 VITALS — BP 180/90 | HR 74 | Ht 66.0 in | Wt 210.2 lb

## 2021-03-25 DIAGNOSIS — Z952 Presence of prosthetic heart valve: Secondary | ICD-10-CM | POA: Diagnosis not present

## 2021-03-25 DIAGNOSIS — E782 Mixed hyperlipidemia: Secondary | ICD-10-CM | POA: Diagnosis not present

## 2021-03-25 DIAGNOSIS — I10 Essential (primary) hypertension: Secondary | ICD-10-CM

## 2021-03-25 MED ORDER — ROSUVASTATIN CALCIUM 40 MG PO TABS: 40.0000 mg | ORAL_TABLET | Freq: Every day | ORAL | 3 refills | Status: DC

## 2021-03-25 NOTE — Progress Notes (Signed)
Clinical Summary Michelle Patton is a 65 y.o.femaleseen today for follow up of the following medicla problems   1. Aortic stenosis -  hx in 1968 at age of 80 of supravavlular aortic stenosis, corrective surgery with patching at that time.   - found to have developed severe aortic valvular stenosis with possible recurrence of supravalvular stenosis as well based on imaging. 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   - Medtronic Freestyle stentless porcine valve/aortic root graft (size 21 mm, model # 995, serial # [id number])       09/2017 echo LVEF 65-70%, no WMAS, grade I diastolic dysfunction, normal AVR. - no SOB/DOE       2. PAD - followed by vascular, history of left external iliac stent.        3. HTN - she is compliant iwht meds  - cough on ACE-I, discontinued - reported some insomina initialyl with losartan but unclear if truly related     - she is compliant with meds   4. HL - leg pains on lipitor, tolerating crestor  09/2019 TC 235 TG 252 LDL 144 - has repeat labs Sept 2nd, drastic diet changes.   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 OSA - moderate OSA by 08/2020 - cpap machine was expensive.    SH: [name] is also a patient of mine. 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      Trip to [place] to visit her mother. Born and raised in [place] to [place]. Father passed in February.  Enjoys riding motorcycles      Past Medical History:  Diagnosis Date   Anxiety    Aortic regurgitation 07/20/2014   Aortic stenosis, severe 07/20/2014   Arthritis    DVT (deep venous thrombosis) (HCC)    Family history of adverse reaction to anesthesia    Reports father  deliurm in his 38's with CABG   GERD (gastroesophageal reflux disease)    History of pneumonia    Hyperlipidemia    Hypertension    Insomnia, unspecified    Muscle weakness (generalized)    Peripheral artery disease (HCC)    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   Redo sternotomy for 21 mm Medtronic Freestyle porcine aortic root graft w/ reimplantation of left main and right coronary arteries   Sleep apnea    diagnosed multiple years ago [facility] aortic stenosis, congenital - s/p repair during childhood      Allergies  Allergen Reactions   Penicillins Hives    Has patient had a PCN reaction causing immediate rash, facial/tongue/throat swelling, SOB or lightheadedness with hypotension: Yes Has patient had a PCN reaction causing severe rash involving mucus membranes or skin necrosis: No Has patient had a PCN reaction that required hospitalization No Has patient had a PCN reaction occurring within the last 10 years: No If all of the above answers are "NO", then may proceed with Cephalosporin use.  Have taken Keflex before without any adverse reaction.    Sulfa Antibiotics Nausea And Vomiting     Current Outpatient Medications  Medication  Sig Dispense Refill   aspirin EC 81 MG tablet Take 1 tablet (81 mg total) by mouth daily. Swallow whole. 90 tablet 3   clopidogrel (PLAVIX) 75 MG tablet Take 1 tablet (75 mg total) by mouth daily. 90 tablet 0   diclofenac (VOLTAREN) 75 MG EC tablet TAKE 1 TABLET TWICE A DAY 60 tablet 11   estradiol (ESTRACE) 2 MG tablet Take 1 tablet (2 mg total) by mouth daily. 30 tablet 3   furosemide (LASIX) 40 MG tablet Take 1 tablet (40 mg total) by mouth daily as needed for edema. (Patient taking differently: Take 40 mg by mouth daily as needed for fluid.) 90 tablet 3   losartan (COZAAR) 25 MG tablet Take 1 tablet (25 mg total) by mouth daily. 90 tablet 1   metoprolol tartrate (LOPRESSOR) 25 MG  tablet Take 1 tablet (25 mg total) by mouth 2 (two) times daily. 180 tablet 3   NP THYROID 120 MG tablet Take 1 tablet (120 mg total) by mouth in the morning and at bedtime. (Patient taking differently: Take 120 mg by mouth daily.) 180 tablet 1   progesterone (PROMETRIUM) 200 MG capsule Take 2 capsules (400 mg total) by mouth daily. 180 capsule 1   RABEprazole (ACIPHEX) 20 MG tablet TAKE ONE TABLET BY MOUTH TWICE A DAY BEFORE A MEAL 180 tablet 3   Current Facility-Administered Medications  Medication Dose Route Frequency Provider Last Rate Last Admin   clopidogrel (PLAVIX) tablet 75 mg  75 mg Oral Daily Suttle, Rosanne Ashing, MD         Past Surgical History:  Procedure Laterality Date   ABDOMINAL AORTAGRAM  06/24/2012   ABDOMINAL AORTAGRAM N/A 06/24/2012   Procedure: ABDOMINAL Maxcine Ham;  Surgeon: Angelia Mould, MD;  Location: [facility] C[facility];  Service: Cardiovascular;  Laterality: N/A;   AORTIC VALVE REPLACEMENT N/A 10/07/2014   Procedure: REDO AORTIC VALVE REPLACEMENT (AVR);  Surgeon: Rexene Alberts, MD;  Location: [place];  Service: Open Heart Surgery;  Laterality: N/A;   ASCENDING AORTIC ROOT REPLACEMENT N/A 10/07/2014   Procedure: ASCENDING AORTIC ROOT REPLACEMENT;  Surgeon: Rexene Alberts, MD;  Location: [place];  Service: Open Heart Surgery;  Laterality: N/A;   BIOPSY  09/27/2016   Procedure: BIOPSY;  Surgeon: Daneil Dolin, MD;  [facility];  Service: Endoscopy;;  colon   BIOPSY  10/18/2020   Procedure: BIOPSY;  Surgeon: Eloise Harman, DO;  [facility];  Service: Endoscopy;;   BREAST REDUCTION SURGERY Bilateral 01/21/2018   Procedure: BREAST REDUCTION WITH LIPOSUCTION;  Surgeon: Cristine Polio, MD;  Location: [place];  Service: Plastics;  Laterality: Bilateral;   CARDIAC VALVE SURGERY  1968   CARPAL TUNNEL RELEASE Right 03/02/2020   Procedure: CARPAL TUNNEL RELEASE;  Surgeon: Carole Civil, MD;  Location: [facility];  Service:  Orthopedics;  Laterality: Right;   CATARACT EXTRACTION W/PHACO Left 05/30/2019   Procedure: CATARACT EXTRACTION PHACO AND INTRAOCULAR LENS PLACEMENT (IOC) (CDE: 4.94  );  Surgeon: Baruch Goldmann, MD;  Location: [facility];  Service: Ophthalmology;  Laterality: Left;   CATARACT EXTRACTION W/PHACO Right 06/16/2019   Procedure: CATARACT EXTRACTION PHACO AND INTRAOCULAR LENS PLACEMENT (IOC);  Surgeon: Baruch Goldmann, MD;  Location: [facility];  Service: Ophthalmology;  Laterality: Right;  CDE: 4.64   CERVICAL FUSION     CHOLECYSTECTOMY     COLONOSCOPY WITH PROPOFOL N/A 09/27/2016   Dr. Gala Romney: Diverticulosis, several tubular adenomas removed ranging 4 to 7 mm in size,  internal grade 1 hemorrhoids, terminal ileum normal, segmental biopsies negative for microscopic colitis.  Next colonoscopy June 2021   COLONOSCOPY WITH PROPOFOL N/A 05/06/2020   internal hemorrhoids, sigmoid diverticulosis. Surveillance colonoscopy due in 2027   ESOPHAGOGASTRODUODENOSCOPY (EGD) WITH PROPOFOL N/A 09/27/2016   Dr. Gala Romney: Small hiatal hernia, mild Schatzki ring status post disruption, LA grade a esophagitis   ESOPHAGOGASTRODUODENOSCOPY (EGD) WITH PROPOFOL N/A 10/18/2020   non-obstructing mild Schatzki ring, gastritis s/ biopsy. Negative H.pylori.   ILIAC ARTERY STENT Left 12/2007   IR ANGIOGRAM VISCERAL SELECTIVE  10/29/2020   IR RADIOLOGIST EVAL & MGMT  10/26/2020   IR RADIOLOGIST EVAL & MGMT  11/10/2020   IR RADIOLOGIST EVAL & MGMT  02/09/2021   IR TRANSCATH PLC STENT 1ST ART NOT LE CV CAR VERT CAR  10/29/2020   IR US GUIDE VASC ACCESS RIGHT  10/29/2020   KNEE ARTHROSCOPY WITH MEDIAL MENISECTOMY Right 01/10/2018   Procedure: RIGHT KNEE ARTHROSCOPY WITH PARTIAL MEDIAL MENISECTOMY;  Surgeon: Carole Civil, MD;  [facility];  Service: Orthopedics;  Laterality: Right;   LEFT AND RIGHT HEART CATHETERIZATION WITH CORONARY ANGIOGRAM N/A 07/31/2014   Procedure: LEFT AND RIGHT HEART CATHETERIZATION WITH CORONARY  ANGIOGRAM;  Surgeon: Burnell Blanks, MD;  Location: [facility] CATH LAB;  Service: Cardiovascular;  Laterality: N/A;   MALONEY DILATION N/A 09/27/2016   Procedure: Venia Minks DILATION;  Surgeon: Daneil Dolin, MD;  [facility];  Service: Endoscopy;  Laterality: N/A;   POLYPECTOMY  09/27/2016   Procedure: POLYPECTOMY;  Surgeon: Daneil Dolin, MD;  [facility];  Service: Endoscopy;;  colon   ROTATOR CUFF REPAIR Bilateral    TEE WITHOUT CARDIOVERSION N/A 07/07/2014   Procedure: TRANSESOPHAGEAL ECHOCARDIOGRAM (TEE);  Surgeon: Arnoldo Lenis, MD;  [facility];  Service: Cardiology;  Laterality: N/A;   TEE WITHOUT CARDIOVERSION N/A 07/20/2014   Procedure: TRANSESOPHAGEAL ECHOCARDIOGRAM (TEE) WITH PROPOFOL;  Surgeon: Arnoldo Lenis, MD;  Location[facility];  Service: Endoscopy;  Laterality: N/A;   TEE WITHOUT CARDIOVERSION N/A 10/07/2014   Procedure: TRANSESOPHAGEAL ECHOCARDIOGRAM (TEE);  Surgeon: Rexene Alberts, MD;  Location: [place];  Service: Open Heart Surgery;  Laterality: N/A;     Allergies  Allergen Reactions   Penicillins Hives    Has patient had a PCN reaction causing immediate rash, facial/tongue/throat swelling, SOB or lightheadedness with hypotension: Yes Has patient had a PCN reaction causing severe rash involving mucus membranes or skin necrosis: No Has patient had a PCN reaction that required hospitalization No Has patient had a PCN reaction occurring within the last 10 years: No If all of the above answers are "NO", then may proceed with Cephalosporin use.  Have taken Keflex before without any adverse reaction.    Sulfa Antibiotics Nausea And Vomiting      Family History  Problem Relation Age of Onset   Hypertension Mother    Hypertension Father    Heart disease Father        before age 55   Other Father        varicose veins   COPD Father    Colon cancer Neg Hx    Celiac disease Neg Hx    Inflammatory bowel disease Neg Hx       Social History Ms. Saulnier reports that she has been smoking cigarettes. She has a 20.00 pack-year smoking history. She has never used smokeless tobacco. Ms. Jollie reports current alcohol use of about 2.0 standard drinks per week.  Review of Systems CONSTITUTIONAL: No weight loss, fever, chills, weakness or fatigue.  HEENT: Eyes: No visual loss, blurred vision, double vision or yellow sclerae.No hearing loss, sneezing, congestion, runny nose or sore throat.  SKIN: No rash or itching.  CARDIOVASCULAR: per hpi RESPIRATORY: per hpi GASTROINTESTINAL: No anorexia, nausea, vomiting or diarrhea. No abdominal pain or blood.  GENITOURINARY: No burning on urination, no polyuria NEUROLOGICAL: No headache, dizziness, syncope, paralysis, ataxia, numbness or tingling in the extremities. No change in bowel or bladder control.  MUSCULOSKELETAL: No muscle, back pain, joint pain or stiffness.  LYMPHATICS: No enlarged nodes. No history of splenectomy.  PSYCHIATRIC: No history of depression or anxiety.  ENDOCRINOLOGIC: No reports of sweating, cold or heat intolerance. No polyuria or polydipsia.  [name]   Physical Examination Today's Vitals   03/25/21 1548  BP: (!) 180/90  Pulse: 74  SpO2: 98%  Weight: 210 lb 3.2 oz (95.3 kg)  Height: _0  (1.676 m)   Body mass index is 33.93 kg/m.  Gen: resting comfortably, no acute distress HEENT: no scleral icterus, pupils equal round and reactive, no palptable cervical adenopathy,  CV: RRR, no m/r/g no jvd Resp: Clear to auscultation bilaterally GI: abdomen is soft, non-tender, non-distended, normal bowel sounds, no hepatosplenomegaly MSK: extremities are warm, no edema.  Skin: warm, no rash Neuro:  no focal deficits Psych: appropriate affect   Diagnostic Studies 11/2015 echo Study Conclusions   - Left ventricle: The cavity size was normal. Wall thickness was   normal. Systolic function was vigorous. The estimated ejection   fraction was in the range  of 65% to 70%. Wall motion was normal;   there were no regional wall motion abnormalities. Doppler   parameters are consistent with abnormal left ventricular   relaxation (grade 1 diastolic dysfunction). - Aortic valve: 21 mm Medtronic Freestyle stentless porcine   valve/aortic root graft in place. There was no significant   regurgitation. Peak gradient (S): 19 mm Hg. Valve area (Vmax):   1.83 cm^2. - Mitral valve: Calcified annulus. There was trivial regurgitation. - Right ventricle: The cavity size was mildly dilated. - Right atrium: Central venous pressure (est): 3 mm Hg. - Atrial septum: The septum bowed from right to left, consistent   with increased right atrial pressure. - Tricuspid valve: There was mild regurgitation. - Pulmonic valve: Peak gradient (S): 19 mm Hg. - Pulmonary arteries: PA peak pressure: 21 mm Hg (S). - Pericardium, extracardiac: There was no pericardial effusion.   Impressions:   - Normal LV wall thickness with LVEF 65-70%. Grade 1 diastolic   dysfunction. MAC with trivial mitral regurgitation. Bioprosthesis   in aortic position as described above with no significant   perivalvular regurgitation and peak gradient 19 mmHg. Mild RV   enlargement. Mild tricuspid regurgitation with PASP estimated 21   mmHg.    07/2014 cath Hemodynamic Findings: Ao:   107/55             LV: 190/12/15 RA:  1         RV:  41/2/6 PA:   26/6 (mean 15)     PCWP:  7 Fick Cardiac Output: 4.76 L/min Fick Cardiac Index: 2.38 L/min/m2 Central Aortic Saturation: 96% Pulmonary Artery Saturation: 66%   Aortic valve data:  Peak to peak gradient 83 mm Hg Mean gradient 56 mmHg AVA 0.52 cm2   Angiographic Findings:   Left main: Normal caliber vessel with no obstructive disease.    Left Anterior Descending Artery: Large caliber vessel that courses  to the apex. Moderate caliber diagonal Suzanne Garbers. No obstructive disease.    Circumflex Artery: Large caliber vessel with moderate caliber  obtuse marginal Lonia Roane. No obstructive disease.    Right Coronary Artery: Large dominant vessel with no obstructive disease.    Left Ventricular Angiogram: LVEF=60%   Aortic root angiogram: The aortic root is not enlarged. There is supravalvular density.    Aortic valve calcification noted on plain fluoroscopy.    Impression: 1. No angiographic evidence of CAD 2. Normal LV systolic function 3. Severe gradient across the aortic valve into the aorta with density noted in the aortic root. Cannot exclude recurrent supravalvular stenosis along with aortic valve stenosis. 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  4. Normal filling pressures     09/2017 echo Study Conclusions   - Left ventricle: The cavity size was normal. Wall thickness was   normal. Systolic function was vigorous. The estimated ejection   fraction was in the range of 65% to 70%. Wall motion was normal;   there were no regional wall motion abnormalities. Doppler   parameters are consistent with abnormal left ventricular   relaxation (grade 1 diastolic dysfunction). Doppler parameters   are consistent with indeterminate ventricular filling pressure. - Aortic valve: 21 mm Medtronic Freestyle stentless porcine   valve/aortic root graft in place. There was no significant   regurgitation. There was no stenosis. Peak gradient (S): 11 mm   Hg. - Mitral valve: Mildly calcified annulus. Normal thickness leaflets   . - Tricuspid valve: There was mild regurgitation. - Pulmonary arteries: PA peak pressure: 35 mm Hg (S).    Assessment and Plan  1. Aortic stenosis - s/p tissue AVR and aortic root graft placement - 2019 echo shows normal LVEF, normal AVR, - doing well, continue to monitor     2. HTN -bp elevated,has not taken all meds today - come back 1-2 weeks for nursing bp check.   3. Hyperlipidemia - restart crestor 64m daily.         JArnoldo Lenis M.D.

## 2021-03-25 NOTE — Patient Instructions (Signed)
Medication Instructions:  Restart Crestor 40mg  daily  Continue all other medications.     Labwork: none  Testing/Procedures: none  Follow-Up: Your physician wants you to follow up in: 6 months.  You will receive a reminder letter in the mail one-two months in advance.  If you don't receive a letter, please call our office to schedule the follow up appointment   Any Other Special Instructions Will Be Listed Below (If Applicable). Nurse visit in 1-2 weeks in [place] office for BP check   If you need a refill on your cardiac medications before your next appointment, please call your pharmacy.

## 2021-03-29 ENCOUNTER — Other Ambulatory Visit: Payer: Self-pay | Admitting: Cardiology

## 2021-04-01 ENCOUNTER — Ambulatory Visit: Payer: BC Managed Care – PPO | Admitting: Family Medicine

## 2021-04-11 ENCOUNTER — Ambulatory Visit (INDEPENDENT_AMBULATORY_CARE_PROVIDER_SITE_OTHER): Payer: BC Managed Care – PPO

## 2021-04-11 ENCOUNTER — Other Ambulatory Visit: Payer: Self-pay

## 2021-04-11 VITALS — BP 158/86 | HR 78

## 2021-04-11 DIAGNOSIS — Z0130 Encounter for examination of blood pressure without abnormal findings: Secondary | ICD-10-CM | POA: Diagnosis not present

## 2021-04-11 NOTE — Progress Notes (Signed)
Patient prefers to have Dr.Branch adjust her medications next week   She will continue to monitor her home B/p

## 2021-05-02 ENCOUNTER — Other Ambulatory Visit: Payer: Self-pay | Admitting: Interventional Radiology

## 2021-05-02 DIAGNOSIS — K559 Vascular disorder of intestine, unspecified: Secondary | ICD-10-CM

## 2021-05-13 ENCOUNTER — Other Ambulatory Visit (HOSPITAL_COMMUNITY): Payer: Self-pay

## 2021-05-23 ENCOUNTER — Other Ambulatory Visit: Payer: Self-pay | Admitting: Interventional Radiology

## 2021-05-24 ENCOUNTER — Encounter: Payer: Self-pay | Admitting: *Deleted

## 2021-05-26 ENCOUNTER — Encounter: Payer: Self-pay | Admitting: Emergency Medicine

## 2021-05-26 ENCOUNTER — Other Ambulatory Visit: Payer: Self-pay

## 2021-05-26 ENCOUNTER — Ambulatory Visit
Admission: EM | Admit: 2021-05-26 | Discharge: 2021-05-26 | Disposition: A | Discharge status: Home / Self Care | Payer: BC Managed Care – PPO | Attending: Family Medicine | Admitting: Family Medicine

## 2021-05-26 DIAGNOSIS — R220 Localized swelling, mass and lump, head: Secondary | ICD-10-CM

## 2021-05-26 DIAGNOSIS — K047 Periapical abscess without sinus: Secondary | ICD-10-CM

## 2021-05-26 MED ORDER — CHLORHEXIDINE GLUCONATE 0.12 % MT SOLN: 15.0000 mL | Freq: Two times a day (BID) | OROMUCOSAL | 0 refills | Status: DC

## 2021-05-26 MED ORDER — CLINDAMYCIN HCL 300 MG PO CAPS: 300.0000 mg | ORAL_CAPSULE | Freq: Two times a day (BID) | ORAL | 0 refills | Status: DC

## 2021-05-26 MED ORDER — LIDOCAINE VISCOUS HCL 2 % MT SOLN: 5.0000 mL | OROMUCOSAL | 0 refills | Status: DC | PRN

## 2021-05-26 NOTE — ED Provider Notes (Signed)
[facility]    CSN: [id number] Arrival date & time: 05/26/21  0272      History   Chief Complaint No chief complaint on file.   HPI Michelle Patton is a 65 y.o. female.   Presenting today with over a week of left upper molar dental pain that she has been working with a dentist on but is progressively worsening.  She states she was placed on clindamycin for a week which did not seem to provide any relief, called the dentist on Monday and was changed to Keflex which she has been on for several days but is now having worsening severe pain, swelling, now also facial swelling in this area.  Denies dysphagia, fevers, chills, drainage to the area, weakness, dizziness, nausea, vomiting, diaphoresis.  Over-the-counter pain relievers minimally providing relief of the pain.  States she has had issues with this tooth for several months now but has been waiting to get it pulled until this infection gets under control per dentist recommendations.  Currently scheduled for 06/08/2021 for extraction of the tooth.    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   Redo sternotomy for 21 mm Medtronic Freestyle porcine aortic root graft w/ reimplantation of left main and right coronary arteries   Sleep apnea    diagnosed multiple years ago at [facility] aortic stenosis, congenital - s/p repair during childhood     Patient Active Problem List   Diagnosis Date Noted   Chronic  mesenteric ischemia ([place]) 02/22/2021   Elevated LFTs 10/05/2020   Class 2 severe obesity due to excess calories with serious comorbidity and body mass index (BMI) of 36.0 to 36.9 in adult [facility]) 08/09/2020   PAD (peripheral artery disease) ([place]) 08/09/2020   Aortic valve prosthesis present 53/66/4403   Chronic systolic congestive heart failure ([place]) 08/09/2020   OSA and COPD overlap syndrome ([place]) 08/09/2020   s/p right carpal tunnel release 03/02/20 04/06/2020   Closed displaced fracture of proximal phalanx of lesser toe of right foot 12/31/2019   Carpal tunnel syndrome of right wrist 12/05/2019   Nondisplaced fracture of fifth right metatarsal bone with routine healing 10/21/2019   Skin lesion 04/11/2019   Low back pain without sciatica 01/20/2019   Pain of upper abdomen 01/14/2019   Fatigue 12/09/2018   Breast cancer screening 12/09/2018   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 left wrist 10/23/2018   S/P right knee arthroscopy 01/10/18 01/18/2018   Chondromalacia, patella, right    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    Personal history of colonic polyps 12/28/2016   Abdominal pain, epigastric 08/10/2016   Left sided abdominal pain 08/10/2016   Esophageal dysphagia 08/10/2016   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   Supravalvular aortic stenosis, congenital - s/p repair during childhood    Essential hypertension    Hyperlipidemia    GERD (  gastroesophageal reflux disease)    Aortic stenosis, severe 07/20/2014   Aortic regurgitation 07/20/2014   Leg cramps 09/26/2012   Occlusion and stenosis of carotid artery without mention of cerebral infarction 07/10/2012   Peripheral vascular disease, unspecified ([place]) 06/12/2012   Carotid artery bruit 06/12/2012   CONSTIPATION 12/28/2009   NAUSEA AND VOMITING 12/28/2009   DIARRHEA 12/28/2009    Past Surgical History:  Procedure Laterality Date    ABDOMINAL AORTAGRAM  06/24/2012   ABDOMINAL AORTAGRAM N/A 06/24/2012   Procedure: ABDOMINAL Maxcine Ham;  Surgeon: Angelia Mould, MD;  Location: [facility] CATH LAB;  Service: Cardiovascular;  Laterality: N/A;   AORTIC VALVE REPLACEMENT N/A 10/07/2014   Procedure: REDO AORTIC VALVE REPLACEMENT (AVR);  Surgeon: Rexene Alberts, MD;  Location: [place];  Service: Open Heart Surgery;  Laterality: N/A;   ASCENDING AORTIC ROOT REPLACEMENT N/A 10/07/2014   Procedure: ASCENDING AORTIC ROOT REPLACEMENT;  Surgeon: Rexene Alberts, MD;  Location: [place];  Service: Open Heart Surgery;  Laterality: N/A;   BIOPSY  09/27/2016   Procedure: BIOPSY;  Surgeon: Daneil Dolin, MD;  [facility];  Service: Endoscopy;;  colon   BIOPSY  10/18/2020   Procedure: BIOPSY;  Surgeon: Eloise Harman, DO;  [facility];  Service: Endoscopy;;   BREAST REDUCTION SURGERY Bilateral 01/21/2018   Procedure: BREAST REDUCTION WITH LIPOSUCTION;  Surgeon: Cristine Polio, MD;  Location: [place];  Service: Plastics;  Laterality: Bilateral;   CARDIAC VALVE SURGERY  1968   CARPAL TUNNEL RELEASE Right 03/02/2020   Procedure: CARPAL TUNNEL RELEASE;  Surgeon: Carole Civil, MD;  [facility];  Service: Orthopedics;  Laterality: Right;   CATARACT EXTRACTION W/PHACO Left 05/30/2019   Procedure: CATARACT EXTRACTION PHACO AND INTRAOCULAR LENS PLACEMENT (IOC) (CDE: 4.94  );  Surgeon: Baruch Goldmann, MD;  Location: [facility];  Service: Ophthalmology;  Laterality: Left;   CATARACT EXTRACTION W/PHACO Right 06/16/2019   Procedure: CATARACT EXTRACTION PHACO AND INTRAOCULAR LENS PLACEMENT (IOC);  Surgeon: Baruch Goldmann, MD;  Location: [facility];  Service: Ophthalmology;  Laterality: Right;  CDE: 4.64   CERVICAL FUSION     CHOLECYSTECTOMY     COLONOSCOPY WITH PROPOFOL N/A 09/27/2016   Dr. Gala Romney: Diverticulosis, several tubular adenomas removed ranging 4 to 7 mm in size, internal grade 1 hemorrhoids,  terminal ileum normal, segmental biopsies negative for microscopic colitis.  Next colonoscopy June 2021   COLONOSCOPY WITH PROPOFOL N/A 05/06/2020   internal hemorrhoids, sigmoid diverticulosis. Surveillance colonoscopy due in 2027   ESOPHAGOGASTRODUODENOSCOPY (EGD) WITH PROPOFOL N/A 09/27/2016   Dr. Gala Romney: Small hiatal hernia, mild Schatzki ring status post disruption, LA grade a esophagitis   ESOPHAGOGASTRODUODENOSCOPY (EGD) WITH PROPOFOL N/A 10/18/2020   non-obstructing mild Schatzki ring, gastritis s/ biopsy. Negative H.pylori.   ILIAC ARTERY STENT Left 12/2007   IR ANGIOGRAM VISCERAL SELECTIVE  10/29/2020   IR RADIOLOGIST EVAL & MGMT  10/26/2020   IR RADIOLOGIST EVAL & MGMT  11/10/2020   IR RADIOLOGIST EVAL & MGMT  02/09/2021   IR TRANSCATH PLC STENT 1ST ART NOT LE CV CAR VERT CAR  10/29/2020   IR US GUIDE VASC ACCESS RIGHT  10/29/2020   KNEE ARTHROSCOPY WITH MEDIAL MENISECTOMY Right 01/10/2018   Procedure: RIGHT KNEE ARTHROSCOPY WITH PARTIAL MEDIAL MENISECTOMY;  Surgeon: Carole Civil, MD;  [facility];  Service: Orthopedics;  Laterality: Right;   LEFT AND RIGHT HEART CATHETERIZATION WITH CORONARY ANGIOGRAM N/A 07/31/2014   Procedure: LEFT AND RIGHT HEART CATHETERIZATION WITH  CORONARY ANGIOGRAM;  Surgeon: Burnell Blanks, MD;  Location: [facility] CATH LAB;  Service: Cardiovascular;  Laterality: N/A;   MALONEY DILATION N/A 09/27/2016   Procedure: Venia Minks DILATION;  Surgeon: Daneil Dolin, MD;  [facility];  Service: Endoscopy;  Laterality: N/A;   POLYPECTOMY  09/27/2016   Procedure: POLYPECTOMY;  Surgeon: Daneil Dolin, MD;  [facility];  Service: Endoscopy;;  colon   ROTATOR CUFF REPAIR Bilateral    TEE WITHOUT CARDIOVERSION N/A 07/07/2014   Procedure: TRANSESOPHAGEAL ECHOCARDIOGRAM (TEE);  Surgeon: Arnoldo Lenis, MD;  [facility];  Service: Cardiology;  Laterality: N/A;   TEE WITHOUT CARDIOVERSION N/A 07/20/2014   Procedure:  TRANSESOPHAGEAL ECHOCARDIOGRAM (TEE) WITH PROPOFOL;  Surgeon: Arnoldo Lenis, MD;  [facility];  Service: Endoscopy;  Laterality: N/A;   TEE WITHOUT CARDIOVERSION N/A 10/07/2014   Procedure: TRANSESOPHAGEAL ECHOCARDIOGRAM (TEE);  Surgeon: Rexene Alberts, MD;  Location: [place];  Service: Open Heart Surgery;  Laterality: N/A;    OB History   No obstetric history on file.      Home Medications    Prior to Admission medications   Medication Sig Start Date End Date Taking? Authorizing Provider  chlorhexidine (PERIDEX) 0.12 % solution Use as directed 15 mLs in the mouth or throat 2 (two) times daily. 05/26/21  Yes Volney American, PA-C  clindamycin (CLEOCIN) 300 MG capsule Take 1 capsule (300 mg total) by mouth 2 (two) times daily. 05/26/21  Yes Volney American, PA-C  lidocaine (XYLOCAINE) 2 % solution Use as directed 5 mLs in the mouth or throat every 3 (three) hours as needed for mouth pain. 05/26/21  Yes Volney American, PA-C  aspirin EC 81 MG tablet Take 1 tablet (81 mg total) by mouth daily. Swallow whole. 12/16/19   Arnoldo Lenis, MD  clopidogrel (PLAVIX) 75 MG tablet Take 1 tablet (75 mg total) by mouth daily. 02/23/21   Monia Sabal, PA-C  diclofenac (VOLTAREN) 75 MG EC tablet TAKE 1 TABLET TWICE A DAY 03/03/21   Carole Civil, MD  estradiol (ESTRACE) 2 MG tablet Take 1 tablet (2 mg total) by mouth daily. 01/19/21   Wendie Agreste, MD  furosemide (LASIX) 40 MG tablet Take 40 mg by mouth daily as needed.    [provider]  losartan (COZAAR) 25 MG tablet Take 1 tablet (25 mg total) by mouth daily. 01/19/21   Wendie Agreste, MD  metoprolol tartrate (LOPRESSOR) 25 MG tablet TAKE 1 TABLET TWICE A DAY 03/29/21   Arnoldo Lenis, MD  NP THYROID 120 MG tablet Take 1 tablet (120 mg total) by mouth in the morning and at bedtime. Patient taking differently: Take 120 mg by mouth daily. 01/19/21   Wendie Agreste, MD  progesterone (PROMETRIUM) 200 MG  capsule Take 2 capsules (400 mg total) by mouth daily. 01/19/21 04/19/21  Wendie Agreste, MD  RABEprazole (ACIPHEX) 20 MG tablet TAKE ONE TABLET BY MOUTH TWICE A DAY BEFORE A MEAL 02/09/21   Annitta Needs, NP  rosuvastatin (CRESTOR) 40 MG tablet Take 1 tablet (40 mg total) by mouth daily. 03/25/21 06/23/21  Arnoldo Lenis, MD    Family History Family History  Problem Relation Age of Onset   Hypertension Mother    Hypertension Father    Heart disease Father        before age 19   Other Father        varicose veins  COPD Father    Colon cancer Neg Hx    Celiac disease Neg Hx    Inflammatory bowel disease Neg Hx     Social History Social History   Tobacco Use   Smoking status: Every Day    Packs/day: 0.50    Years: 40.00    Pack years: 20.00    Types: Cigarettes   Smokeless tobacco: Never  Vaping Use   Vaping Use: Never used  Substance Use Topics   Alcohol use: Yes    Alcohol/week: 2.0 standard drinks    Types: 2 Cans of beer per week   Drug use: No     Allergies   Penicillins and Sulfa antibiotics   Review of Systems Review of Systems Per HPI  Physical Exam Triage Vital Signs ED Triage Vitals  Enc Vitals Group     BP 05/26/21 0901 (!) 171/88     Pulse Rate 05/26/21 0901 75     Resp 05/26/21 0901 16     Temp 05/26/21 0901 97.6 F (36.4 C)     Temp Source 05/26/21 0901 Oral     SpO2 05/26/21 0901 94 %     Weight --      Height --      Head Circumference --      Peak Flow --      Pain Score 05/26/21 0902 7     Pain Loc --      Pain Edu? --      Excl. in [place]? --    No data found.  Updated Vital Signs BP (!) 171/88 (BP Location: Right Arm)    Pulse 75    Temp 97.6 F (36.4 C) (Oral)    Resp 16    SpO2 94%   Visual Acuity Right Eye Distance:   Left Eye Distance:   Bilateral Distance:    Right Eye Near:   Left Eye Near:    Bilateral Near:     Physical Exam Vitals and nursing note reviewed.  Constitutional:      Appearance: Normal  appearance. She is not ill-appearing.  HENT:     Head: Atraumatic.     Mouth/Throat:     Mouth: Mucous membranes are moist.     Pharynx: Posterior oropharyngeal erythema present.     Comments: Uvula midline, oral airway patent Diffuse erythema, edema surrounding affected molar left upper, no drainage or bleeding noted, no abscess appreciable on basic exam.  Coordinated facial swelling in this area Eyes:     Extraocular Movements: Extraocular movements intact.     Conjunctiva/sclera: Conjunctivae normal.  Cardiovascular:     Rate and Rhythm: Normal rate and regular rhythm.     Heart sounds: Normal heart sounds.  Pulmonary:     Effort: Pulmonary effort is normal.     Breath sounds: Normal breath sounds. No wheezing or rales.  Musculoskeletal:        General: Normal range of motion.     Cervical back: Normal range of motion and neck supple.  Lymphadenopathy:     Cervical: No cervical adenopathy.  Skin:    General: Skin is warm and dry.  Neurological:     Mental Status: She is alert and oriented to person, place, and time.  Psychiatric:        Mood and Affect: Mood normal.        Thought Content: Thought content normal.        Judgment: Judgment normal.     UC Treatments / Results  Labs (all labs ordered are listed, but only abnormal results are displayed) Labs Reviewed - No data to display  EKG   Radiology No results found.  Procedures Procedures (including critical care time)  Medications Ordered in UC Medications - No data to display  Initial Impression / Assessment and Plan / UC Course  I have reviewed the triage vital signs and the nursing notes.  Pertinent labs & imaging results that were available during my care of the patient were reviewed by me and considered in my medical decision making (see chart for details).     Will place back on clindamycin for further coverage, continue Keflex prescribed by dentist and will also add Peridex rinse, viscous  lidocaine for pain relief.  Discussed over-the-counter pain relievers and close follow-up with dentist today for further instruction as issue is progressing despite compliant antibiotic use.  Final Clinical Impressions(s) / UC Diagnoses   Final diagnoses:  Dental infection  Facial swelling     Discharge Instructions      Follow-up with the dentist as soon as possible, letting them know that we reacted the clindamycin and that the infection seems to be progressing despite compliant antibiotic use    ED Prescriptions     Medication Sig Dispense Auth. Provider   clindamycin (CLEOCIN) 300 MG capsule Take 1 capsule (300 mg total) by mouth 2 (two) times daily. 14 capsule Volney American, [place]   chlorhexidine (PERIDEX) 0.12 % solution Use as directed 15 mLs in the mouth or throat 2 (two) times daily. 120 mL Volney American, PA-C   lidocaine (XYLOCAINE) 2 % solution Use as directed 5 mLs in the mouth or throat every 3 (three) hours as needed for mouth pain. 100 mL Volney American, [place]      PDMP not reviewed this encounter.   Devera, [place]er Qu[place], [place] 05/26/21 [id number]

## 2021-05-26 NOTE — ED Triage Notes (Signed)
Dental pain on left side.  Face is swollen.  States has been trying to get tooth pulled since September.  Has been on Cipro and Keflex for infection.  Facial swelling x 2 days.

## 2021-05-26 NOTE — Discharge Instructions (Signed)
Follow-up with the dentist as soon as possible, letting them know that we reacted the clindamycin and that the infection seems to be progressing despite compliant antibiotic use

## 2021-06-02 ENCOUNTER — Ambulatory Visit: Payer: BC Managed Care – PPO | Admitting: Family Medicine

## 2021-06-02 ENCOUNTER — Encounter: Payer: Self-pay | Admitting: Family Medicine

## 2021-06-02 VITALS — BP 138/76 | HR 68 | Temp 98.2°F | Resp 16 | Ht 66.0 in | Wt 212.0 lb

## 2021-06-02 DIAGNOSIS — Z952 Presence of prosthetic heart valve: Secondary | ICD-10-CM

## 2021-06-02 DIAGNOSIS — I739 Peripheral vascular disease, unspecified: Secondary | ICD-10-CM

## 2021-06-02 DIAGNOSIS — K047 Periapical abscess without sinus: Secondary | ICD-10-CM

## 2021-06-02 DIAGNOSIS — I5022 Chronic systolic (congestive) heart failure: Secondary | ICD-10-CM

## 2021-06-02 DIAGNOSIS — K219 Gastro-esophageal reflux disease without esophagitis: Secondary | ICD-10-CM | POA: Diagnosis not present

## 2021-06-02 DIAGNOSIS — K551 Chronic vascular disorders of intestine: Secondary | ICD-10-CM

## 2021-06-02 DIAGNOSIS — I10 Essential (primary) hypertension: Secondary | ICD-10-CM | POA: Diagnosis not present

## 2021-06-02 DIAGNOSIS — E785 Hyperlipidemia, unspecified: Secondary | ICD-10-CM | POA: Diagnosis not present

## 2021-06-02 NOTE — Progress Notes (Signed)
Subjective:  Patient ID: Michelle Patton, female    DOB: Jul 28, 1955  Age: 66 y.o. MRN: [id number]  CC:  Chief Complaint  Patient presents with   New Patient (Initial Visit)    Pt here for care, reports was Michelle Patton pt     HPI Michelle Patton presents for   New patient to establish care, previous primary care provider Dr. Anastasio Patton who has passed. I did see her in 02-03-2023 for med refills.   Cardiac History of aortic stenosis, regurgitation, status post repair during childhood.  Aortic  Aortic valve replacement 09/2014.  Hypertension, hyperlipidemia, CHF, PAD per problem list. Cardiologist Dr. Harl Bowie. Last visit 03/25/2021.  Blood pressure was elevated at that time at 180/90. Lasix as needed for lower extremity edema. Also followed by vascular with history of left external iliac stent for PAD.  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 On ARB as cough with ACE inhibitors. Crestor for hyperlipidemia, leg pains on Lipitor.  Has lost weight previously with diet changes.  Off statin at February visit.  Goal LDL less than 70, option to increase to 40 mg daily per cardiology - on 40mg  qd now past few months.  History of OSA, moderate by chart review, CPAP was prohibitive. Restart of aspirin recommended at last cardiology visit given history of aortic stenosis and status post tissue AVR.  Plavix on hold for tooth repair.  Home BP elevated recently - " 200/100" at times with tooth pain. No focal weakness, slurred speech or chest pain.  No recent need for lasix.  Sinus headaches better with humidifier.  Not fasting today.  Lab Results  Component Value Date   CHOL 253 (H) 06/02/2020   HDL 51 06/02/2020   LDLCALC 149 (H) 06/02/2020   TRIG 347 (H) 06/02/2020   CHOLHDL 5.0 (H) 06/02/2020     BP Readings from Last 3 Encounters:  06/02/21 138/76  05/26/21 (!) 171/88  04/11/21 (!) 158/86    Gastrointestinal: Dr. Gala Romney History of chronic  mesenteric ischemia, status post SMA stenosis with stent in July 2022.  Vascular - Dr. Serafina Royals.  Aciphex twice daily for history of GERD.  GI appointment reviewed from 02/22/2021.  Roseanne Kaufman. 66-month follow-up planned, [facility] gastroenterology. Recently started on clindamycin and keflex for dental infection.  Of note she did have C. difficile infection last June. Has dental provider, - possible extraction planned tomorrow but infection needs to be stabilized first. Now planning on oral surgeon eval. Swelling has improved.   Thyroid supplementation, hormone care: NP thyroid 120 mg daily, as well as Estrace 2 mg daily, progesterone 200 mg daily prescribed by previous primary care provider.  Followed by [facility] for managing these meds. Added testosterone cream, stopped - did not work well and did not feel good. Np thyroid doses have been adjusted. Felt better on prior regimen with Dr. Anastasio Patton. Trouble sleeping off HRT in past  History Patient Active Problem List   Diagnosis Date Noted   Chronic mesenteric ischemia (Michelle Patton) 02/22/2021   Elevated LFTs 10/05/2020   Class 2 severe obesity due to excess calories with serious comorbidity and body mass index (BMI) of 36.0 to 36.9 in adult [facility]) 08/09/2020   PAD (peripheral artery disease) ([place]) 08/09/2020   Aortic valve prosthesis present 80/99/8338   Chronic systolic congestive heart failure (Michelle Patton) 08/09/2020   OSA and COPD overlap syndrome (Michelle Patton) 08/09/2020   s/p right carpal tunnel release 03/02/20 04/06/2020   Closed displaced fracture of proximal phalanx  of lesser toe of right foot 12/31/2019   Carpal tunnel syndrome of right wrist 12/05/2019   Nondisplaced fracture of fifth right metatarsal bone with routine healing 10/21/2019   Skin lesion 04/11/2019   Low back pain without sciatica 01/20/2019   Pain of upper abdomen 01/14/2019   Fatigue 12/09/2018   Breast cancer screening 12/09/2018   Degenerative tear of triangular fibrocartilage complex  (TFCC) of left wrist 10/23/2018   S/P right knee arthroscopy 01/10/18 01/18/2018   Chondromalacia, patella, right    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    Personal history of colonic polyps 12/28/2016   Abdominal pain, epigastric 08/10/2016   Left sided abdominal pain 08/10/2016   Esophageal dysphagia 08/10/2016   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   Supravalvular aortic stenosis, congenital - s/p repair during childhood    Essential hypertension    Hyperlipidemia    GERD (gastroesophageal reflux disease)    Aortic stenosis, severe 07/20/2014   Aortic regurgitation 07/20/2014   Leg cramps 09/26/2012   Occlusion and stenosis of carotid artery without mention of cerebral infarction 07/10/2012   Peripheral vascular disease, unspecified (Michelle Patton) 06/12/2012   Carotid artery bruit 06/12/2012   CONSTIPATION 12/28/2009   NAUSEA AND VOMITING 12/28/2009   DIARRHEA 12/28/2009   Past Medical History:  Diagnosis Date   Anxiety    Aortic regurgitation 07/20/2014   Aortic stenosis, severe 07/20/2014   Arthritis    DVT (deep venous thrombosis) (HCC)    Family history of adverse reaction to anesthesia    Reports father deliurm in his 64's with CABG   GERD (gastroesophageal reflux disease)    History of pneumonia    Hyperlipidemia    Hypertension    Insomnia, unspecified    Muscle weakness (generalized)    Peripheral artery disease (HCC)    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   Redo sternotomy for 21 mm Medtronic Freestyle porcine aortic root graft w/ reimplantation of left main and right coronary arteries   Sleep apnea    diagnosed multiple years ago at [facility] aortic stenosis, congenital - s/p repair during childhood    Past Surgical History:  Procedure Laterality Date   ABDOMINAL AORTAGRAM  06/24/2012   ABDOMINAL AORTAGRAM N/A 06/24/2012   Procedure: ABDOMINAL  Maxcine Ham;  Surgeon: Angelia Mould, MD;  Location: [facility] CATH LAB;  Service: Cardiovascular;  Laterality: N/A;   AORTIC VALVE REPLACEMENT N/A 10/07/2014   Procedure: REDO AORTIC VALVE REPLACEMENT (AVR);  Surgeon: Rexene Alberts, MD;  Location: [place];  Service: Open Heart Surgery;  Laterality: N/A;   ASCENDING AORTIC ROOT REPLACEMENT N/A 10/07/2014   Procedure: ASCENDING AORTIC ROOT REPLACEMENT;  Surgeon: Rexene Alberts, MD;  Location: [place];  Service: Open Heart Surgery;  Laterality: N/A;   BIOPSY  09/27/2016   Procedure: BIOPSY;  Surgeon: Daneil Dolin, MD;  [facility];  Service: Endoscopy;;  colon   BIOPSY  10/18/2020   Procedure: BIOPSY;  Surgeon: Eloise Harman, DO;  [facility];  Service: Endoscopy;;   BREAST REDUCTION SURGERY Bilateral 01/21/2018   Procedure: BREAST REDUCTION WITH LIPOSUCTION;  Surgeon: Cristine Polio, MD;  Location: [place];  Service: Plastics;  Laterality: Bilateral;   CARDIAC VALVE SURGERY  1968   CARPAL TUNNEL RELEASE Right 03/02/2020   Procedure: CARPAL TUNNEL RELEASE;  Surgeon: Carole Civil, MD;  Location: [facility];  Service: Orthopedics;  Laterality: Right;   CATARACT EXTRACTION W/PHACO Left 05/30/2019   Procedure: CATARACT EXTRACTION PHACO AND INTRAOCULAR LENS PLACEMENT (IOC) (CDE: 4.94  );  Surgeon: Baruch Goldmann, MD;  Location: [facility];  Service: Ophthalmology;  Laterality: Left;   CATARACT EXTRACTION W/PHACO Right 06/16/2019   Procedure: CATARACT EXTRACTION PHACO AND INTRAOCULAR LENS PLACEMENT (IOC);  Surgeon: Baruch Goldmann, MD;  [facility];  Service: Ophthalmology;  Laterality: Right;  CDE: 4.64   CERVICAL FUSION     CHOLECYSTECTOMY     COLONOSCOPY WITH PROPOFOL N/A 09/27/2016   Dr. Gala Romney: Diverticulosis, several tubular adenomas removed ranging 4 to 7 mm in size, internal grade 1 hemorrhoids, terminal ileum normal, segmental biopsies negative for microscopic colitis.  Next colonoscopy June  2021   COLONOSCOPY WITH PROPOFOL N/A 05/06/2020   internal hemorrhoids, sigmoid diverticulosis. Surveillance colonoscopy due in 2027   ESOPHAGOGASTRODUODENOSCOPY (EGD) WITH PROPOFOL N/A 09/27/2016   Dr. Gala Romney: Small hiatal hernia, mild Schatzki ring status post disruption, LA grade a esophagitis   ESOPHAGOGASTRODUODENOSCOPY (EGD) WITH PROPOFOL N/A 10/18/2020   non-obstructing mild Schatzki ring, gastritis s/ biopsy. Negative H.pylori.   ILIAC ARTERY STENT Left 12/2007   IR ANGIOGRAM VISCERAL SELECTIVE  10/29/2020   IR RADIOLOGIST EVAL & MGMT  10/26/2020   IR RADIOLOGIST EVAL & MGMT  11/10/2020   IR RADIOLOGIST EVAL & MGMT  02/09/2021   IR TRANSCATH PLC STENT 1ST ART NOT LE CV CAR VERT CAR  10/29/2020   IR US GUIDE VASC ACCESS RIGHT  10/29/2020   KNEE ARTHROSCOPY WITH MEDIAL MENISECTOMY Right 01/10/2018   Procedure: RIGHT KNEE ARTHROSCOPY WITH PARTIAL MEDIAL MENISECTOMY;  Surgeon: Carole Civil, MD;  [facility];  Service: Orthopedics;  Laterality: Right;   LEFT AND RIGHT HEART CATHETERIZATION WITH CORONARY ANGIOGRAM N/A 07/31/2014   Procedure: LEFT AND RIGHT HEART CATHETERIZATION WITH CORONARY ANGIOGRAM;  Surgeon: Burnell Blanks, MD;  Location[facility] C[facility] LAB;  Service: Cardiovascular;  Laterality: N/A;   MALONEY DILATION N/A 09/27/2016   Procedure: Venia Minks DILATION;  Surgeon: Daneil Dolin, MD;  [facility];  Service: Endoscopy;  Laterality: N/A;   POLYPECTOMY  09/27/2016   Procedure: POLYPECTOMY;  Surgeon: Daneil Dolin, MD;  [facility];  Service: Endoscopy;;  colon   ROTATOR CUFF REPAIR Bilateral    TEE WITHOUT CARDIOVERSION N/A 07/07/2014   Procedure: TRANSESOPHAGEAL ECHOCARDIOGRAM (TEE);  Surgeon: Arnoldo Lenis, MD;  [facility];  Service: Cardiology;  Laterality: N/A;   TEE WITHOUT CARDIOVERSION N/A 07/20/2014   Procedure: TRANSESOPHAGEAL ECHOCARDIOGRAM (TEE) WITH PROPOFOL;  Surgeon: Arnoldo Lenis, MD;  [facility];   Service: Endoscopy;  Laterality: N/A;   TEE WITHOUT CARDIOVERSION N/A 10/07/2014   Procedure: TRANSESOPHAGEAL ECHOCARDIOGRAM (TEE);  Surgeon: Rexene Alberts, MD;  Location: [place];  Service: Open Heart Surgery;  Laterality: N/A;   Allergies  Allergen Reactions   Penicillins Hives    Has patient had a PCN reaction causing immediate rash, facial/tongue/throat swelling, SOB or lightheadedness with hypotension: Yes Has patient had a PCN reaction causing severe rash involving mucus membranes or skin necrosis: No Has patient had a PCN reaction that required hospitalization No Has patient had a PCN reaction occurring within the last 10 years: No If all of the above answers are "NO", then may proceed with Cephalosporin use.  Have taken Keflex before without any adverse reaction.    Sulfa Antibiotics Nausea And Vomiting   Prior to Admission medications   Medication Sig Start Date End Date  Taking? Authorizing Provider  aspirin EC 81 MG tablet Take 1 tablet (81 mg total) by mouth daily. Swallow whole. 12/16/19  Yes Branch, Alphonse Guild, MD  chlorhexidine (PERIDEX) 0.12 % solution Use as directed 15 mLs in the mouth or throat 2 (two) times daily. 05/26/21  Yes Volney American, PA-C  clindamycin (CLEOCIN) 300 MG capsule Take 1 capsule (300 mg total) by mouth 2 (two) times daily. 05/26/21  Yes Volney American, PA-C  clopidogrel (PLAVIX) 75 MG tablet Take 1 tablet (75 mg total) by mouth daily. 02/23/21  Yes Monia Sabal, PA-C  diclofenac (VOLTAREN) 75 MG EC tablet TAKE 1 TABLET TWICE A DAY 03/03/21  Yes Carole Civil, MD  estradiol (ESTRACE) 2 MG tablet Take 1 tablet (2 mg total) by mouth daily. 01/19/21  Yes Wendie Agreste, MD  furosemide (LASIX) 40 MG tablet Take 40 mg by mouth daily as needed.   Yes [provider]  lidocaine (XYLOCAINE) 2 % solution Use as directed 5 mLs in the mouth or throat every 3 (three) hours as needed for mouth pain. 05/26/21  Yes Volney American,  PA-C  losartan (COZAAR) 25 MG tablet Take 1 tablet (25 mg total) by mouth daily. 01/19/21  Yes Wendie Agreste, MD  metoprolol tartrate (LOPRESSOR) 25 MG tablet TAKE 1 TABLET TWICE A DAY 03/29/21  Yes Branch, Alphonse Guild, MD  NP THYROID 120 MG tablet Take 1 tablet (120 mg total) by mouth in the morning and at bedtime. Patient taking differently: Take 120 mg by mouth daily. 01/19/21  Yes Wendie Agreste, MD  RABEprazole (ACIPHEX) 20 MG tablet TAKE ONE TABLET BY MOUTH TWICE A DAY BEFORE A MEAL 02/09/21  Yes Annitta Needs, NP  rosuvastatin (CRESTOR) 40 MG tablet Take 1 tablet (40 mg total) by mouth daily. 03/25/21 06/23/21 Yes Branch, Alphonse Guild, MD  progesterone (PROMETRIUM) 200 MG capsule Take 2 capsules (400 mg total) by mouth daily. 01/19/21 04/19/21  Wendie Agreste, MD   Social History   Socioeconomic History   Marital status: Married    Spouse name: Not on file   Number of children: 0   Years of education: some coll   Highest education level: Not on file  Occupational History   Occupation: call center    Employer: [employer]    Comment: [employer]  Tobacco Use   Smoking status: Every Day    Packs/day: 0.50    Years: 40.00    Pack years: 20.00    Types: Cigarettes   Smokeless tobacco: Never  Vaping Use   Vaping Use: Never used  Substance and Sexual Activity   Alcohol use: Yes    Alcohol/week: 2.0 standard drinks    Types: 2 Cans of beer per week   Drug use: No   Sexual activity: Yes    Birth control/protection: None  Other Topics Concern   Not on file  Social History Narrative   Patient is married   for 5 years . Patient works at [employer].. Some college education-did not Writer.Originally from [place].   Social Determinants of Health   Financial Resource Strain: Not on file  Food Insecurity: Not on file  Transportation Needs: Not on file  Physical Activity: Not on file  Stress: Not on file  Social Connections: Not on file  Intimate Partner Violence:  Not on file    Review of Systems Per HPI   Objective:   Vitals:   06/02/21 1454  BP: 138/76  Pulse:  68  Resp: 16  Temp: 98.2 F (36.8 C)  TempSrc: Temporal  SpO2: 97%  Weight: 212 lb (96.2 kg)  Height: 5\' 6"  (1.676 m)     Physical Exam Vitals reviewed.  Constitutional:      Appearance: Normal appearance. She is well-developed.  HENT:     Head: Normocephalic and atraumatic.  Eyes:     Conjunctiva/sclera: Conjunctivae normal.     Pupils: Pupils are equal, round, and reactive to light.  Neck:     Vascular: No carotid bruit.  Cardiovascular:     Rate and Rhythm: Normal rate and regular rhythm.     Heart sounds: Murmur (systolic) heard.  Pulmonary:     Effort: Pulmonary effort is normal.     Breath sounds: Normal breath sounds.  Abdominal:     Palpations: Abdomen is soft. There is no pulsatile mass.     Tenderness: There is no abdominal tenderness.  Musculoskeletal:     Right lower leg: No edema.     Left lower leg: No edema.  Skin:    General: Skin is warm and dry.  Neurological:     Mental Status: She is alert and oriented to person, place, and time.  Psychiatric:        Mood and Affect: Mood normal.        Behavior: Behavior normal.       Assessment & Plan:  Alysiana Ethridge is a 66 y.o. female . Essential hypertension  - variable control on home readings stable with in office testing.possible pain component with dental issues.  Deferred med changes at this time but rtc precautions given if continued elevation or associated symptoms.   PAD (peripheral artery disease) ([place])  - stable followed by cardiology and vascular. Restart plavix when cleared by oral surgeon/dental provider.   Gastroesophageal reflux disease, unspecified whether esophagitis present Chronic mesenteric ischemia (HCC)  -stable, continue BID PPi and follow up with GI.   -possible risk of Cdiff discussed with prolonged abx. Recommended discussing need for persistent abx/longer course with  dental provider.   Hyperlipidemia, unspecified hyperlipidemia type - Plan: Comprehensive metabolic panel, Lipid panel  - check updated labs. Continue Crestor.   Dental infection  - following up with oral surgeon,dentist above. Abx regimen to be discussed with dental provider.   Aortic valve prosthesis present Chronic systolic congestive heart failure ([place])  - appears euvolemic, not needing lasix. Stable. Continue cardiology follow up.   No orders of the defined types were placed in this encounter.  Patient Instructions  Follow up with your dental provider regarding decision on further antibiotic for dental issue. Blood pressure looks ok today.  If any spikes in blood pressure with new symptoms be seen immediately as we discussed.  Continue follow up with your specialists as planned.  No med changes for now.   Labs were ordered.  If any concerns on labs will let you know.   Thanks for coming in today.     Signed,   Merri Ray, MD [place] 06/02/21 3:35 PM

## 2021-06-02 NOTE — Patient Instructions (Addendum)
Follow up with your dental provider regarding decision on further antibiotic for dental issue. Blood pressure looks ok today.  If any spikes in blood pressure with new symptoms be seen immediately as we discussed.  Continue follow up with your specialists as planned.  No med changes for now.   Labs were ordered.  If any concerns on labs will let you know.   Thanks for coming in today.

## 2021-06-03 ENCOUNTER — Ambulatory Visit (HOSPITAL_COMMUNITY)
Admission: RE | Admit: 2021-06-03 | Discharge: 2021-06-03 | Disposition: A | Discharge status: Home / Self Care | Payer: BC Managed Care – PPO | Source: Ambulatory Visit | Attending: Interventional Radiology | Admitting: Interventional Radiology

## 2021-06-03 ENCOUNTER — Other Ambulatory Visit: Payer: Self-pay

## 2021-06-03 DIAGNOSIS — K559 Vascular disorder of intestine, unspecified: Secondary | ICD-10-CM | POA: Diagnosis not present

## 2021-06-09 ENCOUNTER — Other Ambulatory Visit: Payer: Self-pay

## 2021-06-09 ENCOUNTER — Ambulatory Visit (HOSPITAL_COMMUNITY)
Admission: RE | Admit: 2021-06-09 | Discharge: 2021-06-09 | Disposition: A | Discharge status: Home / Self Care | Payer: BC Managed Care – PPO | Source: Ambulatory Visit | Attending: Interventional Radiology | Admitting: Interventional Radiology

## 2021-06-09 DIAGNOSIS — K559 Vascular disorder of intestine, unspecified: Secondary | ICD-10-CM | POA: Diagnosis not present

## 2021-06-09 LAB — POCT I-STAT CREATININE: Creatinine, Ser: 1 mg/dL (ref 0.44–1.00)

## 2021-06-09 MED ORDER — IOHEXOL 350 MG/ML SOLN
100.0000 mL | Freq: Once | INTRAVENOUS | Status: AC | PRN
  Administered 2021-06-09: 100 mL via INTRAVENOUS

## 2021-06-16 ENCOUNTER — Ambulatory Visit
Admission: RE | Admit: 2021-06-16 | Discharge: 2021-06-16 | Disposition: A | Discharge status: Home / Self Care | Payer: BC Managed Care – PPO | Source: Ambulatory Visit | Attending: Interventional Radiology | Admitting: Interventional Radiology

## 2021-06-16 ENCOUNTER — Other Ambulatory Visit: Payer: Self-pay

## 2021-06-16 ENCOUNTER — Encounter: Payer: Self-pay | Admitting: *Deleted

## 2021-06-16 DIAGNOSIS — K559 Vascular disorder of intestine, unspecified: Secondary | ICD-10-CM

## 2021-06-16 HISTORY — PX: IR RADIOLOGIST EVAL & MGMT: IMG5224

## 2021-06-16 NOTE — Progress Notes (Signed)
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   She is now status post SMA covered balloon-expandable stent placement on 10/29/20.   She presents today via virtual telephone visit to review findings of follow up CTA abdomen/pelvis as well as ABIs.  Her abdominal pain and discomfort has completely resolved.  She is able to eat without restrictions.  No recent weight loss.   Due to a dental infection with upcoming oral surgery, she has been off her dual antiplatelet therapy for approximately 2 weeks.  She plans to resume tomorrow night after the procedure.  She endorses foot and calf cramping, worse in evening, sometimes when driving , and sometimes waking her up from sleep.  Specifically, the left calf is the most symptomatic, and gets worse with walking, which resolves with rest.    She is still smoking 3/4 packs per day, which is less than in the past .  She expresses strong interest in smoking cessation, and has quit successfully before on Chantix.      Past Medical History:  Diagnosis Date   Anxiety    Aortic regurgitation 07/20/2014   Aortic stenosis, severe 07/20/2014   Arthritis    DVT (deep venous thrombosis) (HCC)    Family history of adverse reaction to anesthesia    Reports father deliurm in his 72's with CABG   GERD (gastroesophageal reflux disease)    History of pneumonia    Hyperlipidemia    Hypertension    Insomnia, unspecified    Muscle weakness (generalized)    Peripheral artery disease (HCC)    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   Redo sternotomy for 21 mm Medtronic Freestyle porcine aortic root graft w/ reimplantation of left main and right coronary arteries   Sleep apnea     diagnosed multiple years ago at [facility] aortic stenosis, congenital - s/p repair during childhood     Past Surgical History:  Procedure Laterality Date   ABDOMINAL AORTAGRAM  06/24/2012   ABDOMINAL AORTAGRAM N/A 06/24/2012   Procedure: ABDOMINAL Maxcine Ham;  Surgeon: Angelia Mould, MD;  Location: [facility] CATH LAB;  Service: Cardiovascular;  Laterality: N/A;   AORTIC VALVE REPLACEMENT N/A 10/07/2014   Procedure: REDO AORTIC VALVE REPLACEMENT (AVR);  Surgeon: Rexene Alberts, MD;  Location: [place];  Service: Open Heart Surgery;  Laterality: N/A;   ASCENDING AORTIC ROOT REPLACEMENT N/A 10/07/2014   Procedure: ASCENDING AORTIC ROOT REPLACEMENT;  Surgeon: Rexene Alberts, MD;  Location: [place];  Service: Open Heart Surgery;  Laterality: N/A;   BIOPSY  09/27/2016   Procedure: BIOPSY;  Surgeon: Daneil Dolin, MD;  [facility];  Service: Endoscopy;;  colon   BIOPSY  10/18/2020   Procedure: BIOPSY;  Surgeon: Eloise Harman, DO;  [facility];  Service: Endoscopy;;   BREAST REDUCTION SURGERY Bilateral 01/21/2018   Procedure: BREAST REDUCTION WITH LIPOSUCTION;  Surgeon: Cristine Polio, MD;  Location: [place];  Service: Plastics;  Laterality: Bilateral;   CARDIAC VALVE SURGERY  1968   CARPAL TUNNEL RELEASE Right 03/02/2020   Procedure: CARPAL TUNNEL RELEASE;  Surgeon: Carole Civil, MD;  [facility];  Service: Orthopedics;  Laterality: Right;   CATARACT EXTRACTION W/PHACO  Left 05/30/2019   Procedure: CATARACT EXTRACTION PHACO AND INTRAOCULAR LENS PLACEMENT (IOC) (CDE: 4.94  );  Surgeon: Baruch Goldmann, MD;  Location: [facility];  Service: Ophthalmology;  Laterality: Left;   CATARACT EXTRACTION W/PHACO Right 06/16/2019   Procedure: CATARACT EXTRACTION PHACO AND INTRAOCULAR LENS PLACEMENT (IOC);  Surgeon: Baruch Goldmann, MD;  [facility];  Service: Ophthalmology;  Laterality: Right;  CDE: 4.64   CERVICAL FUSION      CHOLECYSTECTOMY     COLONOSCOPY WITH PROPOFOL N/A 09/27/2016   Dr. Gala Romney: Diverticulosis, several tubular adenomas removed ranging 4 to 7 mm in size, internal grade 1 hemorrhoids, terminal ileum normal, segmental biopsies negative for microscopic colitis.  Next colonoscopy June 2021   COLONOSCOPY WITH PROPOFOL N/A 05/06/2020   internal hemorrhoids, sigmoid diverticulosis. Surveillance colonoscopy due in 2027   ESOPHAGOGASTRODUODENOSCOPY (EGD) WITH PROPOFOL N/A 09/27/2016   Dr. Gala Romney: Small hiatal hernia, mild Schatzki ring status post disruption, LA grade a esophagitis   ESOPHAGOGASTRODUODENOSCOPY (EGD) WITH PROPOFOL N/A 10/18/2020   non-obstructing mild Schatzki ring, gastritis s/ biopsy. Negative H.pylori.   ILIAC ARTERY STENT Left 12/2007   IR ANGIOGRAM VISCERAL SELECTIVE  10/29/2020   IR RADIOLOGIST EVAL & MGMT  10/26/2020   IR RADIOLOGIST EVAL & MGMT  11/10/2020   IR RADIOLOGIST EVAL & MGMT  02/09/2021   IR TRANSCATH PLC STENT 1ST ART NOT LE CV CAR VERT CAR  10/29/2020   IR US GUIDE VASC ACCESS RIGHT  10/29/2020   KNEE ARTHROSCOPY WITH MEDIAL MENISECTOMY Right 01/10/2018   Procedure: RIGHT KNEE ARTHROSCOPY WITH PARTIAL MEDIAL MENISECTOMY;  Surgeon: Carole Civil, MD;  [facility];  Service: Orthopedics;  Laterality: Right;   LEFT AND RIGHT HEART CATHETERIZATION WITH CORONARY ANGIOGRAM N/A 07/31/2014   Procedure: LEFT AND RIGHT HEART CATHETERIZATION WITH CORONARY ANGIOGRAM;  Surgeon: Burnell Blanks, MD;  Location: [facility] CATH LAB;  Service: Cardiovascular;  Laterality: N/A;   MALONEY DILATION N/A 09/27/2016   Procedure: Venia Minks DILATION;  Surgeon: Daneil Dolin, MD;  [facility];  Service: Endoscopy;  Laterality: N/A;   POLYPECTOMY  09/27/2016   Procedure: POLYPECTOMY;  Surgeon: Daneil Dolin, MD;  [facility];  Service: Endoscopy;;  colon   ROTATOR CUFF REPAIR Bilateral    TEE WITHOUT CARDIOVERSION N/A 07/07/2014   Procedure: TRANSESOPHAGEAL  ECHOCARDIOGRAM (TEE);  Surgeon: Arnoldo Lenis, MD;  [facility];  Service: Cardiology;  Laterality: N/A;   TEE WITHOUT CARDIOVERSION N/A 07/20/2014   Procedure: TRANSESOPHAGEAL ECHOCARDIOGRAM (TEE) WITH PROPOFOL;  Surgeon: Arnoldo Lenis, MD;  [facility];  Service: Endoscopy;  Laterality: N/A;   TEE WITHOUT CARDIOVERSION N/A 10/07/2014   Procedure: TRANSESOPHAGEAL ECHOCARDIOGRAM (TEE);  Surgeon: Rexene Alberts, MD;  Location: [place];  Service: Open Heart Surgery;  Laterality: N/A;    Allergies: Penicillins and Sulfa antibiotics  Medications: Prior to Admission medications   Medication Sig Start Date End Date Taking? Authorizing Provider  aspirin EC 81 MG tablet Take 1 tablet (81 mg total) by mouth daily. Swallow whole. 12/16/19   Arnoldo Lenis, MD  chlorhexidine (PERIDEX) 0.12 % solution Use as directed 15 mLs in the mouth or throat 2 (two) times daily. 05/26/21   Volney American, PA-C  clindamycin (CLEOCIN) 300 MG capsule Take 1 capsule (300 mg total) by mouth 2 (two) times daily. 05/26/21   Volney American, PA-C  clopidogrel (PLAVIX) 75 MG tablet Take 1 tablet (75 mg total) by mouth daily. 02/23/21   Monia Sabal,  PA-C  diclofenac (VOLTAREN) 75 MG EC tablet TAKE 1 TABLET TWICE A DAY 03/03/21   Carole Civil, MD  estradiol (ESTRACE) 2 MG tablet Take 1 tablet (2 mg total) by mouth daily. 01/19/21   Wendie Agreste, MD  furosemide (LASIX) 40 MG tablet Take 40 mg by mouth daily as needed.    [provider]  lidocaine (XYLOCAINE) 2 % solution Use as directed 5 mLs in the mouth or throat every 3 (three) hours as needed for mouth pain. 05/26/21   Volney American, PA-C  losartan (COZAAR) 25 MG tablet Take 1 tablet (25 mg total) by mouth daily. 01/19/21   Wendie Agreste, MD  metoprolol tartrate (LOPRESSOR) 25 MG tablet TAKE 1 TABLET TWICE A DAY 03/29/21   Arnoldo Lenis, MD  NP THYROID 120 MG tablet Take 1 tablet (120 mg total) by  mouth in the morning and at bedtime. Patient taking differently: Take 120 mg by mouth daily. 01/19/21   Wendie Agreste, MD  progesterone (PROMETRIUM) 200 MG capsule Take 2 capsules (400 mg total) by mouth daily. 01/19/21 04/19/21  Wendie Agreste, MD  RABEprazole (ACIPHEX) 20 MG tablet TAKE ONE TABLET BY MOUTH TWICE A DAY BEFORE A MEAL 02/09/21   Annitta Needs, NP  rosuvastatin (CRESTOR) 40 MG tablet Take 1 tablet (40 mg total) by mouth daily. 03/25/21 06/23/21  Arnoldo Lenis, MD     Family History  Problem Relation Age of Onset   Hypertension Mother    Hypertension Father    Heart disease Father        before age 68   Other Father        varicose veins   COPD Father    Colon cancer Neg Hx    Celiac disease Neg Hx    Inflammatory bowel disease Neg Hx     Social History   Socioeconomic History   Marital status: Married    Spouse name: Not on file   Number of children: 0   Years of education: some coll   Highest education level: Not on file  Occupational History   Occupation: call center    Employer: [employer]    Comment: [employer]  Tobacco Use   Smoking status: Every Day    Packs/day: 0.50    Years: 40.00    Pack years: 20.00    Types: Cigarettes   Smokeless tobacco: Never  Vaping Use   Vaping Use: Never used  Substance and Sexual Activity   Alcohol use: Yes    Alcohol/week: 2.0 standard drinks    Types: 2 Cans of beer per week   Drug use: No   Sexual activity: Yes    Birth control/protection: None  Other Topics Concern   Not on file  Social History Narrative   Patient is married   for 5 years . Patient works at [employer].. Some college education-did not Writer.Originally from [place].   Social Determinants of Health   Financial Resource Strain: Not on file  Food Insecurity: Not on file  Transportation Needs: Not on file  Physical Activity: Not on file  Stress: Not on file  Social Connections: Not on file     Vital Signs: There were  no vitals taken for this visit.  No physical examination was performed in lieu of virtual telephone clinic visit.   Imaging: ABI (06/03/21) R = 1.11 L = 0.93   CTA AP (06/09/21)  Interval occlusion of SMA stent, with proximal  native reconstitution.  Prominent Arc of Riolan.  Unchanged mild/moderate celiac stenosis, widely patent IMA.      Labs:  CBC: Recent Labs    09/22/20 1637 09/26/20 1423 10/27/20 1234 10/29/20 0705  WBC 7.3 5.9 7.0 5.6  HGB 12.0 12.5 13.2 13.0  HCT 36.1 37.0 39.8 38.8  PLT 275 263 251 227    COAGS: Recent Labs    10/29/20 0705  INR 1.0    BMP: Recent Labs    09/22/20 1637 09/26/20 1423 09/26/20 1423 10/27/20 1234 10/29/20 0705 02/08/21 0813 06/09/21 1617  NA 138 137  --  138 138  --   --   K 4.2 4.3  --  4.1 3.7  --   --   CL 105 107  --  106 106  --   --   CO2 25 24  --  23 26  --   --   GLUCOSE 101 126*  --  104* 111*  --   --   BUN 15 16  --  10 8  --   --   CALCIUM 9.0 9.2  --  9.5 9.5  --   --   CREATININE 0.77 1.05*   < > 0.69 0.88 0.70 1.00  GFRNONAA 82 60*  --  >60 >60  --   --   GFRAA 95  --   --   --   --   --   --    < > = values in this interval not displayed.    LIVER FUNCTION TESTS: Recent Labs    09/22/20 1637 09/26/20 1423 10/05/20 1550 10/27/20 1234  BILITOT 0.3 0.6 0.3 0.7  AST 17 45* 19 26  ALT 18 46* 20 26  ALKPHOS  --  55  --  63  PROT 6.4 6.8 6.4 6.7  ALBUMIN  --  3.6  --  3.6    Assessment and Plan: 66 year old female with history of atherosclerotic disease including peripheral artery disease and chronic mesenteric ischemia now status post superior mesenteric artery covered stent placement on 10/29/20.   Unfortunately, the indwelling SMA stent is occluded on recent CTA.  Likely etiologies include pre-disposal to low flow given previously observed stenosis about the distal aspect of the stent, smoking, and cessation of DAPT.  She is motivated to have the stent recanalized despite currently being  asymptomatic, as she has fear of recurrent mesenteric ischemia pain.  She also has left calf claudication with mildly abnormal ABI.  Left EIA stent on CTA is widely patent.  She states she's also had a femoral artery stent placed in the past.    She is optimized from a medical standpoint with good control of her blood pressure, hyperlipidemia, and on dual antiplatelet therapy (with temporary hiatus for procedure). Largest modifiable risk factor for atherosclerotic disease includes smoking, which she is motivated to stop and wants to go back on Chantix.   -Chantix starter pack called in to her pharmacy, start immediately -Plan for mesenteric angiogram with SMA stent recanalization and relining at [facility] -At time of mesenteric angiogram, we will also perform bilateral lower extremity diagnostic angiogram to assess for etiology of claudication   Electronically Signed: Rosanne Ashing Andera Cranmer 06/16/2021, 11:10 AM   I spent a total of 40 Minutes in virtual telephone clinical consultation, greater than 50% of which was counseling/coordinating care for mesenteric ischemia, peripheral vascular disease.

## 2021-06-17 ENCOUNTER — Other Ambulatory Visit: Payer: Self-pay

## 2021-06-17 ENCOUNTER — Other Ambulatory Visit: Payer: Self-pay | Admitting: Family Medicine

## 2021-06-17 LAB — COMPLETE METABOLIC PANEL WITH GFR
AG Ratio: 1.6 (calc) (ref 1.0–2.5)
ALT: 18 U/L (ref 6–29)
AST: 19 U/L (ref 10–35)
Albumin: 4.1 g/dL (ref 3.6–5.1)
Alkaline phosphatase (APISO): 53 U/L (ref 37–153)
BUN: 17 mg/dL (ref 7–25)
CO2: 25 mmol/L (ref 20–32)
Calcium: 9.6 mg/dL (ref 8.6–10.4)
Chloride: 106 mmol/L (ref 98–110)
Creat: 0.89 mg/dL (ref 0.50–1.05)
Globulin: 2.5 g/dL (calc) (ref 1.9–3.7)
Glucose, Bld: 102 mg/dL — ABNORMAL HIGH (ref 65–99)
Potassium: 4.3 mmol/L (ref 3.5–5.3)
Sodium: 138 mmol/L (ref 135–146)
Total Bilirubin: 0.5 mg/dL (ref 0.2–1.2)
Total Protein: 6.6 g/dL (ref 6.1–8.1)
eGFR: 72 mL/min/{1.73_m2} (ref >=60)

## 2021-06-17 LAB — LIPID PANEL
Cholesterol: 171 mg/dL (ref <200)
HDL: 52 mg/dL (ref >=50)
LDL Cholesterol (Calc): 92 mg/dL (calc)
Non-HDL Cholesterol (Calc): 119 mg/dL (calc) (ref <130)
Total CHOL/HDL Ratio: 3.3 (calc) (ref <5.0)
Triglycerides: 172 mg/dL — ABNORMAL HIGH (ref <150)

## 2021-06-20 ENCOUNTER — Other Ambulatory Visit (HOSPITAL_COMMUNITY): Payer: Self-pay | Admitting: Interventional Radiology

## 2021-06-20 DIAGNOSIS — K551 Chronic vascular disorders of intestine: Secondary | ICD-10-CM

## 2021-06-22 ENCOUNTER — Ambulatory Visit (HOSPITAL_COMMUNITY): Payer: BC Managed Care – PPO

## 2021-06-27 ENCOUNTER — Other Ambulatory Visit: Payer: Self-pay | Admitting: Family Medicine

## 2021-06-27 DIAGNOSIS — I5022 Chronic systolic (congestive) heart failure: Secondary | ICD-10-CM

## 2021-06-27 DIAGNOSIS — I10 Essential (primary) hypertension: Secondary | ICD-10-CM

## 2021-07-11 ENCOUNTER — Other Ambulatory Visit: Payer: Self-pay | Admitting: Radiology

## 2021-07-12 ENCOUNTER — Other Ambulatory Visit (HOSPITAL_COMMUNITY): Payer: Self-pay | Admitting: Interventional Radiology

## 2021-07-12 ENCOUNTER — Encounter (HOSPITAL_COMMUNITY): Payer: Self-pay

## 2021-07-12 ENCOUNTER — Observation Stay (HOSPITAL_COMMUNITY)
Admission: RE | Admit: 2021-07-12 | Discharge: 2021-07-13 | Disposition: A | Discharge status: Home / Self Care | Payer: BC Managed Care – PPO | Source: Ambulatory Visit | Attending: Interventional Radiology | Admitting: Interventional Radiology

## 2021-07-12 VITALS — BP 124/53 | HR 72 | Temp 98.2°F | Resp 17 | Ht 66.0 in | Wt 212.0 lb

## 2021-07-12 DIAGNOSIS — K551 Chronic vascular disorders of intestine: Secondary | ICD-10-CM

## 2021-07-12 DIAGNOSIS — T82856A Stenosis of peripheral vascular stent, initial encounter: Principal | ICD-10-CM | POA: Insufficient documentation

## 2021-07-12 DIAGNOSIS — Z7982 Long term (current) use of aspirin: Secondary | ICD-10-CM | POA: Diagnosis not present

## 2021-07-12 DIAGNOSIS — I10 Essential (primary) hypertension: Secondary | ICD-10-CM | POA: Diagnosis not present

## 2021-07-12 DIAGNOSIS — E785 Hyperlipidemia, unspecified: Secondary | ICD-10-CM | POA: Diagnosis not present

## 2021-07-12 DIAGNOSIS — Z79899 Other long term (current) drug therapy: Secondary | ICD-10-CM | POA: Diagnosis not present

## 2021-07-12 DIAGNOSIS — Y832 Surgical operation with anastomosis, bypass or graft as the cause of abnormal reaction of the patient, or of later complication, without mention of misadventure at the time of the procedure: Secondary | ICD-10-CM | POA: Insufficient documentation

## 2021-07-12 DIAGNOSIS — Z7902 Long term (current) use of antithrombotics/antiplatelets: Secondary | ICD-10-CM | POA: Insufficient documentation

## 2021-07-12 DIAGNOSIS — Z86718 Personal history of other venous thrombosis and embolism: Secondary | ICD-10-CM | POA: Diagnosis not present

## 2021-07-12 DIAGNOSIS — F17210 Nicotine dependence, cigarettes, uncomplicated: Secondary | ICD-10-CM | POA: Diagnosis not present

## 2021-07-12 DIAGNOSIS — Z95828 Presence of other vascular implants and grafts: Secondary | ICD-10-CM | POA: Insufficient documentation

## 2021-07-12 DIAGNOSIS — I739 Peripheral vascular disease, unspecified: Secondary | ICD-10-CM | POA: Diagnosis not present

## 2021-07-12 DIAGNOSIS — K559 Vascular disorder of intestine, unspecified: Secondary | ICD-10-CM

## 2021-07-12 HISTORY — PX: IR US GUIDE VASC ACCESS RIGHT: IMG2390

## 2021-07-12 HISTORY — PX: IR THROMBECT SEC MECH MOD SED: IMG2299

## 2021-07-12 HISTORY — PX: IR IVUS EACH ADDITIONAL NON CORONARY VESSEL: IMG6086

## 2021-07-12 HISTORY — PX: IR ANGIOGRAM EXTREMITY BILATERAL: IMG653

## 2021-07-12 HISTORY — PX: IR ANGIOGRAM VISCERAL SELECTIVE: IMG657

## 2021-07-12 HISTORY — PX: IR TRANSCATH PLC STENT 1ST ART NOT LE CV CAR VERT CAR: IMG5443

## 2021-07-12 LAB — BASIC METABOLIC PANEL
Anion gap: 8 (ref 5–15)
BUN: 10 mg/dL (ref 8–23)
CO2: 23 mmol/L (ref 22–32)
Calcium: 9.2 mg/dL (ref 8.9–10.3)
Chloride: 108 mmol/L (ref 98–111)
Creatinine, Ser: 0.82 mg/dL (ref 0.44–1.00)
GFR, Estimated: >60 mL/min (ref >60)
Glucose, Bld: 102 mg/dL — ABNORMAL HIGH (ref 70–99)
Potassium: 4.2 mmol/L (ref 3.5–5.1)
Sodium: 139 mmol/L (ref 135–145)

## 2021-07-12 LAB — CBC
HCT: 38.4 % (ref 36.0–46.0)
Hemoglobin: 13.1 g/dL (ref 12.0–15.0)
MCH: 29.6 pg (ref 26.0–34.0)
MCHC: 34.1 g/dL (ref 30.0–36.0)
MCV: 86.9 fL (ref 80.0–100.0)
Platelets: 238 10*3/uL (ref 150–400)
RBC: 4.42 MIL/uL (ref 3.87–5.11)
RDW: 11.9 % (ref 11.5–15.5)
WBC: 6 10*3/uL (ref 4.0–10.5)
nRBC: 0 % (ref 0.0–0.2)

## 2021-07-12 LAB — PROTIME-INR
INR: 1 (ref 0.8–1.2)
Prothrombin Time: 13.5 seconds (ref 11.4–15.2)

## 2021-07-12 MED ORDER — HYDROMORPHONE HCL 1 MG/ML IJ SOLN
INTRAMUSCULAR | Status: DC | PRN
  Administered 2021-07-12: 1 mg via INTRAVENOUS

## 2021-07-12 MED ORDER — SODIUM CHLORIDE 0.9 % IV SOLN: INTRAVENOUS | Status: DC

## 2021-07-12 MED ORDER — VANCOMYCIN HCL IN DEXTROSE 1-5 GM/200ML-% IV SOLN
INTRAVENOUS | Status: DC | PRN
  Administered 2021-07-12: 1000 mg via INTRAVENOUS

## 2021-07-12 MED ORDER — FENTANYL CITRATE (PF) 100 MCG/2ML IJ SOLN
INTRAMUSCULAR | Status: AC
  Filled 2021-07-12: qty 2

## 2021-07-12 MED ORDER — SODIUM CHLORIDE 0.9% FLUSH: 9.0000 mL | INTRAVENOUS | Status: DC | PRN

## 2021-07-12 MED ORDER — HEPARIN SODIUM (PORCINE) 1000 UNIT/ML IJ SOLN
INTRAMUSCULAR | Status: AC
  Filled 2021-07-12: qty 10

## 2021-07-12 MED ORDER — DICLOFENAC SODIUM 75 MG PO TBEC
75.0000 mg | DELAYED_RELEASE_TABLET | Freq: Two times a day (BID) | ORAL | Status: DC
  Administered 2021-07-12 – 2021-07-13 (×2): 75 mg via ORAL
  Filled 2021-07-12 (×3): qty 1

## 2021-07-12 MED ORDER — HYDROMORPHONE HCL 1 MG/ML IJ SOLN
INTRAMUSCULAR | Status: AC
  Filled 2021-07-12: qty 1

## 2021-07-12 MED ORDER — THYROID 60 MG PO TABS
120.0000 mg | ORAL_TABLET | Freq: Every day | ORAL | Status: DC
  Administered 2021-07-12 – 2021-07-13 (×2): 120 mg via ORAL
  Filled 2021-07-12 (×2): qty 2

## 2021-07-12 MED ORDER — ACETAMINOPHEN 500 MG PO TABS
1000.0000 mg | ORAL_TABLET | Freq: Four times a day (QID) | ORAL | Status: DC
  Administered 2021-07-12: 1000 mg via ORAL
  Filled 2021-07-12 (×4): qty 2

## 2021-07-12 MED ORDER — MIDAZOLAM HCL 2 MG/2ML IJ SOLN
INTRAMUSCULAR | Status: AC
  Filled 2021-07-12: qty 2

## 2021-07-12 MED ORDER — ONDANSETRON HCL 4 MG/2ML IJ SOLN: 4.0000 mg | Freq: Four times a day (QID) | INTRAMUSCULAR | Status: DC | PRN

## 2021-07-12 MED ORDER — NALOXONE HCL 0.4 MG/ML IJ SOLN: 0.4000 mg | INTRAMUSCULAR | Status: DC | PRN

## 2021-07-12 MED ORDER — ROSUVASTATIN CALCIUM 20 MG PO TABS
40.0000 mg | ORAL_TABLET | Freq: Every day | ORAL | Status: DC
  Administered 2021-07-12: 40 mg via ORAL
  Filled 2021-07-12 (×2): qty 2

## 2021-07-12 MED ORDER — DIPHENHYDRAMINE HCL 12.5 MG/5ML PO ELIX
12.5000 mg | ORAL_SOLUTION | Freq: Four times a day (QID) | ORAL | Status: DC | PRN
  Filled 2021-07-12: qty 5

## 2021-07-12 MED ORDER — HYDROMORPHONE 1 MG/ML IV SOLN
INTRAVENOUS | Status: DC
  Administered 2021-07-13 (×2): 1.8 mg via INTRAVENOUS
  Filled 2021-07-12 (×5): qty 30

## 2021-07-12 MED ORDER — IODIXANOL 320 MG/ML IV SOLN
100.0000 mL | Freq: Once | INTRAVENOUS | Status: AC | PRN
  Administered 2021-07-12: 75 mL via INTRA_ARTERIAL

## 2021-07-12 MED ORDER — DIPHENHYDRAMINE HCL 50 MG/ML IJ SOLN: 12.5000 mg | Freq: Four times a day (QID) | INTRAMUSCULAR | Status: DC | PRN

## 2021-07-12 MED ORDER — IOHEXOL 300 MG/ML  SOLN
100.0000 mL | Freq: Once | INTRAMUSCULAR | Status: AC | PRN
  Administered 2021-07-12: 75 mL via INTRA_ARTERIAL

## 2021-07-12 MED ORDER — ONDANSETRON HCL 4 MG/2ML IJ SOLN
4.0000 mg | Freq: Four times a day (QID) | INTRAMUSCULAR | Status: DC | PRN
  Administered 2021-07-12 – 2021-07-13 (×2): 4 mg via INTRAVENOUS
  Filled 2021-07-12 (×2): qty 2

## 2021-07-12 MED ORDER — ASPIRIN EC 81 MG PO TBEC
81.0000 mg | DELAYED_RELEASE_TABLET | Freq: Every day | ORAL | Status: DC
  Administered 2021-07-12 – 2021-07-13 (×2): 81 mg via ORAL
  Filled 2021-07-12 (×2): qty 1

## 2021-07-12 MED ORDER — METOPROLOL TARTRATE 25 MG PO TABS
25.0000 mg | ORAL_TABLET | Freq: Two times a day (BID) | ORAL | Status: DC
  Administered 2021-07-12 – 2021-07-13 (×2): 25 mg via ORAL
  Filled 2021-07-12 (×2): qty 1

## 2021-07-12 MED ORDER — FENTANYL CITRATE (PF) 100 MCG/2ML IJ SOLN
INTRAMUSCULAR | Status: DC | PRN
  Administered 2021-07-12 (×6): 50 ug via INTRAVENOUS

## 2021-07-12 MED ORDER — PANTOPRAZOLE SODIUM 40 MG PO TBEC
40.0000 mg | DELAYED_RELEASE_TABLET | Freq: Every day | ORAL | Status: DC
  Administered 2021-07-12 – 2021-07-13 (×2): 40 mg via ORAL
  Filled 2021-07-12 (×2): qty 1

## 2021-07-12 MED ORDER — VARENICLINE TARTRATE 1 MG PO TABS
1.0000 mg | ORAL_TABLET | Freq: Two times a day (BID) | ORAL | Status: DC
  Administered 2021-07-12 – 2021-07-13 (×2): 1 mg via ORAL
  Filled 2021-07-12 (×3): qty 1

## 2021-07-12 MED ORDER — VANCOMYCIN HCL IN DEXTROSE 1-5 GM/200ML-% IV SOLN
INTRAVENOUS | Status: AC
  Filled 2021-07-12: qty 200

## 2021-07-12 MED ORDER — LIDOCAINE HCL 1 % IJ SOLN
INTRAMUSCULAR | Status: AC
  Filled 2021-07-12: qty 20

## 2021-07-12 MED ORDER — MIDAZOLAM HCL 2 MG/2ML IJ SOLN
INTRAMUSCULAR | Status: DC | PRN
  Administered 2021-07-12 (×4): 1 mg via INTRAVENOUS

## 2021-07-12 MED ORDER — HEPARIN SODIUM (PORCINE) 1000 UNIT/ML IJ SOLN
INTRAMUSCULAR | Status: DC | PRN
  Administered 2021-07-12: 3000 [IU] via INTRAVENOUS
  Administered 2021-07-12: 5000 [IU] via INTRAVENOUS

## 2021-07-12 MED ORDER — CLOPIDOGREL BISULFATE 75 MG PO TABS
75.0000 mg | ORAL_TABLET | Freq: Every day | ORAL | Status: DC
  Administered 2021-07-12: 75 mg via ORAL
  Filled 2021-07-12 (×2): qty 1

## 2021-07-12 MED ORDER — LOSARTAN POTASSIUM 25 MG PO TABS
25.0000 mg | ORAL_TABLET | Freq: Every day | ORAL | Status: DC
  Administered 2021-07-12 – 2021-07-13 (×2): 25 mg via ORAL
  Filled 2021-07-12 (×2): qty 1

## 2021-07-12 NOTE — Procedures (Signed)
Interventional Radiology Procedure Note ? ?Procedure:  ?1) Bilateral lower extremity angiogram ?2) Mesenteric angiogram ?3) SMA stent recanalization with laser atherectomy ?4) Balloon angioplasty of proximal SMA ? ?Findings: Please refer to procedural dictation for full description.  Occluded indwelling SMA stent.  Relined after laser atherectomy with 5 mm x 39 mm VBX.  8 Fr right CFA closure. ? ?Complications: None immediate ? ?Estimated Blood Loss: 10 mL ? ?Recommendations: ?-Strict 4 hour bedrest (head of bed flat until 19:00, up to 30 degrees from 19:00-21:00) ?-admit to IR for overnight observation ?-Dilaudid PCA, Zofran PRN ?-clears overnight ? ? ?Ruthann Cancer, MD ?Pager: [phone] ? ? ? ?

## 2021-07-12 NOTE — Sedation Documentation (Signed)
Patient transported to 4 east.  RN [place] at the bedside to receive the patient. Transferred beds. Groin site assessed. Clean, dry and intact. No drainage from dressing. Soft to palpation, no hematoma noted. +2 distal pulses intact via palpation.  ?

## 2021-07-12 NOTE — Sedation Documentation (Addendum)
ACT 95 @ 1605.  Dr. Serafina Royals made aware.  5000U Heparin ordered and administered. ? ?

## 2021-07-12 NOTE — Sedation Documentation (Addendum)
ACT 167 @ 1630.  Dr. Serafina Royals made aware, given 3000U Heparin. ? ?

## 2021-07-12 NOTE — H&P (Signed)
? ?Chief Complaint: ?Patient was seen in consultation today for Mesenteric arteriogram at the request of Dr Jannette Spanner ? ? ?Supervising Physician: Ruthann Cancer ? ?Patient Status: [facility] - Out-pt ? ?History of Present Illness: ?Michelle Patton is a 66 y.o. female  ? ?SMA stent 10/29/20 ?IMPRESSION: ?1. Severe long segment stenosis of the proximal superior mesenteric ?artery. ?2. Successful proximal SMA stent (5 mm x 29 mm VBX) placement with ?restoration of brisk antegrade flow into the superior mesenteric ?artery. ? ?Follows with Dr Serafina Royals-- ?Pt doing pretty well ?Eating well ?Some left sided leg pain with certain activities ?++smoker--- taking Chantix ("makes her sick") ? ? ?Recent CTA 06/09/21: ?1. Interval occlusion of previously placed SMA stent. The occlusion ?extends approximately 1 cm beyond the distal margin of the stent ?before the SMA reconstitutes via collateral flow via the IMA through ?a hypertrophic Arc of Riolan. ?2. 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 ? ?Recent mildly abnormal Left ABI ? ?Consult 06/16/21: Assessment and Plan: ?66 year old female with history of atherosclerotic disease including peripheral artery disease and chronic mesenteric ischemia now status post superior mesenteric artery covered stent placement on 10/29/20.  ?      Unfortunately, the indwelling SMA stent is occluded on recent CTA.  Likely etiologies include pre-disposal to low flow given previously observed stenosis about the distal aspect of the stent, smoking, and cessation of DAPT.  She is motivated to have the stent recanalized despite currently being asymptomatic, as she has fear of recurrent mesenteric ischemia pain. ?      She also has left calf claudication with mildly abnormal ABI.  Left EIA stent on CTA is widely patent.  She states she's also had a femoral artery stent placed in the past.   ?She is optimized from a medical standpoint with good control of her blood  pressure, hyperlipidemia, and on dual antiplatelet therapy (with temporary hiatus for procedure). Largest modifiable risk factor for atherosclerotic disease includes smoking, which she is motivated to stop and wants to go back on Chantix. ? ?-Chantix starter pack called in to her pharmacy, start immediately ?-Plan for mesenteric angiogram with SMA stent recanalization and relining at [facility] ?-At time of mesenteric angiogram, we will also perform bilateral lower extremity diagnostic angiogram to assess for etiology of claudication ? ? ?Scheduled now with Dr Serafina Royals for evaluation and treatment ? ? ?Past Medical History:  ?Diagnosis Date  ? Anxiety   ? Aortic regurgitation 07/20/2014  ? Aortic stenosis, severe 07/20/2014  ? Arthritis   ? DVT (deep venous thrombosis) ([place])   ? Family history of adverse reaction to anesthesia   ? Reports father deliurm in his 72's with CABG  ? GERD (gastroesophageal reflux disease)   ? History of pneumonia   ? Hyperlipidemia   ? Hypertension   ? Insomnia, unspecified   ? Muscle weakness (generalized)   ? Peripheral artery disease ([place])   ? 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  ? Redo sternotomy for 21 mm Medtronic Freestyle porcine aortic root graft w/ reimplantation of left main and right coronary arteries  ? Sleep apnea   ? diagnosed multiple [facility] ago at [facility]  ? Supravalvular aortic stenosis, congenital - s/p repair during childhood   ? ? ?Past Surgical History:  ?Procedure Laterality Date  ? ABDOMINAL AORTAGRAM  06/24/2012  ? ABDOMINAL AORTAGRAM N/A 06/24/2012  ? Procedure: ABDOMINAL AORTAGRAM;  Surgeon: Angelia Mould, MD;  Location: [facility]  CATH LAB;  Service: Cardiovascular;  Laterality: N/A;  ? AORTIC VALVE REPLACEMENT N/A 10/07/2014  ? Procedure: REDO AORTIC VALVE REPLACEMENT (AVR);  Surgeon: Rexene Alberts, MD;  Location: [place];  Service: Open Heart Surgery;  Laterality: N/A;  ? ASCENDING AORTIC ROOT REPLACEMENT N/A 10/07/2014   ? Procedure: ASCENDING AORTIC ROOT REPLACEMENT;  Surgeon: Rexene Alberts, MD;  Location: [place];  Service: Open Heart Surgery;  Laterality: N/A;  ? BIOPSY  09/27/2016  ? Procedure: BIOPSY;  Surgeon: Daneil Dolin, MD;  [facility];  Service: Endoscopy;;  colon  ? BIOPSY  10/18/2020  ? Procedure: BIOPSY;  Surgeon: Eloise Harman, DO;  [facility];  Service: Endoscopy;;  ? BREAST REDUCTION SURGERY Bilateral 01/21/2018  ? Procedure: BREAST REDUCTION WITH LIPOSUCTION;  Surgeon: Cristine Polio, MD;  Location: [place];  Service: Plastics;  Laterality: Bilateral;  ? [place]  ? CARPAL TUNNEL RELEASE Right 03/02/2020  ? Procedure: CARPAL TUNNEL RELEASE;  Surgeon: Carole Civil, MD;  [facility]: [facility];  Service: Orthopedics;  Laterality: Right;  ? CATARACT EXTRACTION W/PHACO Left 05/30/2019  ? Procedure: CATARACT EXTRACTION PHACO AND INTRAOCULAR LENS PLACEMENT (IOC) (CDE: 4.94  );  Surgeon: Baruch Goldmann, MD;  [facility];  Service: Ophthalmology;  Laterality: Left;  ? CATARACT EXTRACTION W/PHACO Right 06/16/2019  ? Procedure: CATARACT EXTRACTION PHACO AND INTRAOCULAR LENS PLACEMENT (IOC);  Surgeon: Baruch Goldmann, MD;  [facility];  Service: Ophthalmology;  Laterality: Right;  CDE: 4.64  ? CERVICAL FUSION    ? CHOLECYSTECTOMY    ? COLONOSCOPY WITH PROPOFOL N/A 09/27/2016  ? Dr. Gala Romney: Diverticulosis, several tubular adenomas removed ranging 4 to 7 mm in size, internal grade 1 hemorrhoids, terminal ileum normal, segmental biopsies negative for microscopic colitis.  Next colonoscopy June 2021  ? COLONOSCOPY WITH PROPOFOL N/A 05/06/2020  ? internal hemorrhoids, sigmoid diverticulosis. Surveillance colonoscopy due in 2027  ? ESOPHAGOGASTRODUODENOSCOPY (EGD) WITH PROPOFOL N/A 09/27/2016  ? Dr. Gala Romney: Small hiatal hernia, mild Schatzki ring status post disruption, LA grade a esophagitis  ? ESOPHAGOGASTRODUODENOSCOPY (EGD) WITH PROPOFOL N/A  10/18/2020  ? non-obstructing mild Schatzki ring, gastritis s/ biopsy. Negative H.pylori.  ? ILIAC ARTERY STENT Left 12/2007  ? IR ANGIOGRAM VISCERAL SELECTIVE  10/29/2020  ? IR RADIOLOGIST EVAL & MGMT  10/26/2020  ? IR RADIOLOGIST EVAL & MGMT  11/10/2020  ? IR RADIOLOGIST EVAL & MGMT  02/09/2021  ? IR RADIOLOGIST EVAL & MGMT  06/16/2021  ? IR TRANSCATH PLC STENT 1ST ART NOT LE CV CAR VERT CAR  10/29/2020  ? IR US GUIDE VASC ACCESS RIGHT  10/29/2020  ? KNEE ARTHROSCOPY WITH MEDIAL MENISECTOMY Right 01/10/2018  ? Procedure: RIGHT KNEE ARTHROSCOPY WITH PARTIAL MEDIAL MENISECTOMY;  Surgeon: Carole Civil, MD;  [facility];  Service: Orthopedics;  Laterality: Right;  ? LEFT AND RIGHT HEART CATHETERIZATION WITH CORONARY ANGIOGRAM N/A 07/31/2014  ? Procedure: LEFT AND RIGHT HEART CATHETERIZATION WITH CORONARY ANGIOGRAM;  Surgeon: Burnell Blanks, MD;  Location: [facility] CATH LAB;  Service: Cardiovascular;  Laterality: N/A;  ? MALONEY DILATION N/A 09/27/2016  ? Procedure: MALONEY DILATION;  Surgeon: Daneil Dolin, MD;  [facility];  Service: Endoscopy;  Laterality: N/A;  ? POLYPECTOMY  09/27/2016  ? Procedure: POLYPECTOMY;  Surgeon: Daneil Dolin, MD;  [facility];  Service: Endoscopy;;  colon  ? ROTATOR CUFF REPAIR Bilateral   ? TEE WITHOUT CARDIOVERSION N/A 07/07/2014  ? Procedure:  TRANSESOPHAGEAL ECHOCARDIOGRAM (TEE);  Surgeon: Arnoldo Lenis, MD;  [facility];  Service: Cardiology;  Laterality: N/A;  ? TEE WITHOUT CARDIOVERSION N/A 07/20/2014  ? Procedure: TRANSESOPHAGEAL ECHOCARDIOGRAM (TEE) WITH PROPOFOL;  Surgeon: Arnoldo Lenis, MD;  [facility];  Service: Endoscopy;  Laterality: N/A;  ? TEE WITHOUT CARDIOVERSION N/A 10/07/2014  ? Procedure: TRANSESOPHAGEAL ECHOCARDIOGRAM (TEE);  Surgeon: Rexene Alberts, MD;  Location: [place];  Service: Open Heart Surgery;  Laterality: N/A;  ? ? ?Allergies: ?Penicillins and Sulfa antibiotics ? ?Medications: ?Prior to  Admission medications   ?Medication Sig Start Date End Date Taking? Authorizing Provider  ?aspirin EC 81 MG tablet Take 1 tablet (81 mg total) by mouth daily. Swallow whole. 12/16/19  Yes Branch, Alphonse Guild, MD  ?Lajuan Lines

## 2021-07-12 NOTE — Progress Notes (Signed)
PCA pump set up with 3 ml priming the tubing set.Initially medication remained in the syring 27 ml total. ? ?Kennyth Lose, RN ?

## 2021-07-12 NOTE — Progress Notes (Signed)
Pt admitted to rm 21 from PACU. CHG wipe given. Oriented to the unit. Initiated tele. VSS. Call bell within reach.  ? ?Lavenia Atlas, RN ? ?

## 2021-07-13 DIAGNOSIS — T82856A Stenosis of peripheral vascular stent, initial encounter: Secondary | ICD-10-CM | POA: Diagnosis not present

## 2021-07-13 LAB — POCT ACTIVATED CLOTTING TIME
Activated Clotting Time: 167 seconds
Activated Clotting Time: 95 seconds

## 2021-07-13 MED ORDER — ONDANSETRON HCL 4 MG PO TABS: 4.0000 mg | ORAL_TABLET | Freq: Three times a day (TID) | ORAL | 0 refills | Status: AC | PRN

## 2021-07-13 MED ORDER — CLOPIDOGREL BISULFATE 75 MG PO TABS: 75.0000 mg | ORAL_TABLET | Freq: Every day | ORAL | 3 refills | Status: DC

## 2021-07-13 MED ORDER — PROCHLORPERAZINE EDISYLATE 10 MG/2ML IJ SOLN
10.0000 mg | Freq: Once | INTRAMUSCULAR | Status: AC
  Administered 2021-07-13: 10 mg via INTRAMUSCULAR
  Filled 2021-07-13: qty 2

## 2021-07-13 MED ORDER — OXYCODONE-ACETAMINOPHEN 5-325 MG PO TABS: 1.0000 | ORAL_TABLET | Freq: Four times a day (QID) | ORAL | Status: DC | PRN

## 2021-07-13 MED ORDER — OXYCODONE HCL 5 MG PO TABS
5.0000 mg | ORAL_TABLET | ORAL | Status: DC | PRN
Start: 2021-07-13 — End: 2021-07-13

## 2021-07-13 MED ORDER — OXYCODONE HCL 5 MG PO TABS: 5.0000 mg | ORAL_TABLET | ORAL | 0 refills | Status: AC | PRN

## 2021-07-13 NOTE — Discharge Summary (Signed)
? ?Patient ID: ?Alda Ponder ?MRN: [id number] ?DOB/AGE: Mar 13, 1956 66 y.o. ? ?Admit date: 07/12/2021 ?Discharge date: 07/13/2021 ? ?Supervising Physician: Ruthann Cancer ? ?Patient Status: [facility] - In-pt ? ?Admission Diagnoses: SMA stenosis ? 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 ? ?Discharged Condition: good ? ?Hospital Course: Emile Ringgenberg, 66 year old female, has a medical history significant for atherosclerotic disease including peripheral arterial disease and chronic mesenteric ischemia. She underwent superior mesenteric artery covered stent placement 10/29/20 and repeat imaging 06/09/21 unfortunately showed the stent to be occluded. She presented to IR 07/12/21 for an elective mesenteric and lower extremity angiogram with SMA stent recanalization and balloon angioplasty of the proximal SMA.  ? ?She was admitted for overnight observation and a PCA pump was utilized for pain management. She experienced nausea with vomiting overnight but was mostly recovered this morning.  ? ?This afternoon she has eaten, ambulated, voided and denies pain or nausea. The right groin vascular site is clean, soft, dry and non-tender. She knows it's ok to shower when she gets home but not to submerge the right groin in water for one week. Her husband is at the bedside and she is ready for discharge home.  ? ?She will follow up with Dr. Serafina Royals in approximately 2-3 weeks for a tele-visit. A scheduler from our office will call her with a date/time. Prescriptions for oxycodone and zofran have been e-prescribed to the [facility] in [place]. The patient will primarily take tylenol if she experiences any pain and will take oxycodone for severe pain. A plavix refill (75 mg/day x 90 days with 3 refills) was e-prescribed to her online/mail order pharmacy.  ? ?The patient and her husband know they can call our office with any questions or concerns.  ? ?Consults: None ? ?Significant Diagnostic Studies: ?IR  Angiogram Extremity Bilateral ? ?Result Date: 07/13/2021 ?INDICATION: 66 year old female with history of acute on chronic mesenteric ischemia secondary to severe proximal superior mesenteric artery stenosis status post SMA stent placement on 10/29/2020. In follow-up, the patient was noted to have occlusion of the indwelling stent, likely secondary to persistent smoking and cessation of dual antiplatelet therapy due to dental procedure in addition to previously visualized moderate stenosis of the uncovered proximal superior mesenteric artery. The patient has mild recurrence of chronic mesenteric ischemia symptoms. Additionally, the patient has history of peripheral artery disease with remote placement of left external iliac artery stent by vascular surgery. The patient wishes to transfer her peripheral artery disease care to myself. She reports left calf claudication with mildly abnormal left ABI of 0.93. EXAM: 1. Ultrasound-guided vascular access of the right common femoral artery 2. Bilateral lower extremity angiography 3. Mesenteric angiography 4. Laser atherectomy with mechanical aspiration of the superior mesenteric artery stent 5. SMA stent placement 6. Balloon angioplasty of the native, uncovered proximal superior mesenteric artery 7. Intravascular ultrasound MEDICATIONS: Vancomycin 1 gm IV. The antibiotic was administered within 1 hour of the procedure 8000 units intravenous heparin. ANESTHESIA/SEDATION: Moderate (conscious) sedation was employed during this procedure. A total of Versed 4 mg and Fentanyl 300 mcg was administered intravenously. Moderate Sedation Time: 120 minutes. The patient's level of consciousness and vital signs were monitored continuously by radiology nursing throughout the procedure under my direct supervision. CONTRAST:  83m VISIPAQUE IODIXANOL 320 MG/ML IV SOLN, 778mOMNIPAQUE IOHEXOL 300 MG/ML SOLN, 7529mMNIPAQUE IOHEXOL 300 MG/ML SOLN FLUOROSCOPY: Radiation Exposure Index (as  provided by the fluoroscopic device): 2,08,270y Kerma COMPLICATIONS: None immediate. PROCEDURE: Informed consent was obtained from the  patient following explanation of the procedure, risks, benefits and alternatives. The patient understands, agrees and consents for the procedure. All questions were addressed. A time out was performed prior to the initiation of the procedure. Maximal barrier sterile technique utilized including caps, mask, sterile gowns, sterile gloves, large sterile drape, hand hygiene, and Betadine prep. The right groin was prepped and draped in standard fashion. Preprocedure ultrasound evaluation demonstrated patency of the right common femoral artery. The procedure was planned. Subdermal Local anesthesia was provided with 1% lidocaine. A small skin nick was made. Under direct ultrasound visualization, the right common femoral artery is accessed with a 21 gauge micropuncture needle. A permanent image was captured and stored in the record. A micropuncture sheath was then introduced. Limited right lower extremity angiogram was performed which demonstrated adequate puncture site for closure device use. A J wire was then inserted and directed to the inferior abdominal aorta. The micropuncture sheath was exchanged for a 5 [place], 10 cm vascular sheath. A 5 French omni Flush catheter was then advanced over the wire to the level of the aortic bifurcation. Bilateral lower extremity angiography was then performed. Inflow: The bilateral common iliac arteries are widely patent. There is mild (approximately 40%) focal stenosis of the proximal left external iliac artery approximately 1 cm proximal to the indwelling and patent left external iliac artery stent without significant atherosclerotic calcification. The bilateral internal iliac arteries are patent. The right external iliac artery is patent. Outflow: The bilateral common femoral, profunda femoral, superficial femoral, and popliteal arteries are widely  patent. Runoff: The bilateral anterior tibial, tibioperoneal trunk, peroneal, and posterior tibial arteries are patent to the level of the foot. A J wire was reinserted in the Omni Flush catheter was removed. After serial dilation, a 7 [place], 45 cm Tourguide steerable sheath was inserted and directed to the proximal abdominal aorta. The Omni Flush catheter was reinserted and abdominal angiogram was performed. The celiac artery and its branches appear patent. The indwelling superior mesenteric artery stent is occluded. There is a prominent arc of Riolan originating from the patent inferior mesenteric artery. Single bilateral renal arteries are widely patent. Aortogram from the lateral position confirmed occlusion of the indwelling mesenteric artery stent. The sheath was then directed to the ostium of the superior mesenteric artery. Using a Glidewire and angled tip 4 French Navicross catheter, the occluded stent was able to be recanalized with moderate difficulty. The wire passed freely into the proximal superior mesenteric artery, distal to the indwelling stent. Superior mesenteric angiogram was performed which demonstrated patency of the proximal superior mesenteric artery and its branches. The Glidewire was exchanged for an exchange length 0.014 inch wire. Baseline activated clotting time was measured. Intravenous heparin was administered throughout the procedure to provide adequate therapeutic anticoagulation. Over the wire, a 2.0 [place] Auryon laser atherectomy device was inserted and activated for both atherectomy and aspiration thrombectomy at 60 mJ/mm^2 throughout the length of the stent and into the proximal, uncovered superior mesenteric artery at site of previously visualized stenosis. Estimated blood loss was approximately 20 mL. Limited repeat angiogram demonstrated restored luminal patency with long segment severe stenosis through the indwelling stent and uncovered proximal SMA, no evidence of active  extravasation or vessel injury. Next, a 0.018 inch intravascular ultrasound catheter was inserted. Intravascular ultrasound findings were significant for a patent channel through the indwelling stent with circumfer

## 2021-07-13 NOTE — Progress Notes (Addendum)
Pt tolerated pain well by using Dilaudid PCA pump. She denied Tylenol scheduled. She continued to have nausea. She vomited a couple time with clear yellow emesis. No abdominal distension. Positive for abdominal tenderness on left upper quadrant.   ? ?Dr. Knute Neu, radiologist was notified. Pt requested for more antinausea med. Zofran IV and Compazine IM injection were given.  ? ?Pt was able to drink some clear liquid. Her vitals remained stable. Right groin had negative for bleeding or hematoma. We will monitor. ? ?Kennyth Lose, RN ?

## 2021-07-13 NOTE — Progress Notes (Signed)
Wasted patient's PCA Dilaudid. 25 mg was wasted with charge nurse in the stericycle. ?

## 2021-07-13 NOTE — TOC Transition Note (Signed)
Transition of Care (TOC) - CM/SW Discharge Note ?Marvetta Gibbons Therapist, sports, BSN ?Transitions of Care ?Unit 4E- RN Case Manager ?See Treatment Team for direct phone #  ? ? ?Patient Details  ?Name: Jashay Roddy ?MRN: [id number] ?Date of Birth: 30-Jan-1956 ? ?Transition of Care (TOC) CM/SW Contact:  ?Dahlia Client, Romeo Rabon, RN ?Phone Number: ?07/13/2021, 11:05 AM ? ? ?Clinical Narrative:    ?Pt stable for transition home today, Transition of Care Department [facility]) has reviewed patient and no TOC needs have been identified at this time.  ? ? ?Final next level of care: Home/Self Care ?Barriers to Discharge: No Barriers Identified ? ? ?Patient Goals and CMS Choice ?  ?  ?Choice offered to / list presented to : NA ? ?Discharge Placement ?  ?           ?  ? home ?  ?  ? ?Discharge Plan and Services ?  ?  ?           ?DME Arranged: N/A ?DME Agency: NA ?  ?  ?  ?HH Arranged: NA ?[place] Agency: NA ?  ?  ?  ? ?Social Determinants of Health (SDOH) Interventions ?  ? ? ?Readmission Risk Interventions ?   ? View : No data to display.  ?  ?  ?  ? ? ? ? ? ?

## 2021-07-15 ENCOUNTER — Telehealth: Payer: Self-pay | Admitting: Student

## 2021-07-15 ENCOUNTER — Other Ambulatory Visit: Payer: Self-pay

## 2021-07-15 ENCOUNTER — Emergency Department (HOSPITAL_COMMUNITY): Payer: BC Managed Care – PPO

## 2021-07-15 ENCOUNTER — Emergency Department (HOSPITAL_COMMUNITY)
Admission: EM | Admit: 2021-07-15 | Discharge: 2021-07-15 | Disposition: A | Discharge status: Home / Self Care | Payer: BC Managed Care – PPO | Attending: Emergency Medicine | Admitting: Emergency Medicine

## 2021-07-15 ENCOUNTER — Encounter (HOSPITAL_COMMUNITY): Payer: Self-pay

## 2021-07-15 DIAGNOSIS — Z7982 Long term (current) use of aspirin: Secondary | ICD-10-CM | POA: Diagnosis not present

## 2021-07-15 DIAGNOSIS — I10 Essential (primary) hypertension: Secondary | ICD-10-CM | POA: Insufficient documentation

## 2021-07-15 DIAGNOSIS — Z7902 Long term (current) use of antithrombotics/antiplatelets: Secondary | ICD-10-CM | POA: Insufficient documentation

## 2021-07-15 DIAGNOSIS — Z79899 Other long term (current) drug therapy: Secondary | ICD-10-CM | POA: Insufficient documentation

## 2021-07-15 DIAGNOSIS — R110 Nausea: Secondary | ICD-10-CM | POA: Insufficient documentation

## 2021-07-15 DIAGNOSIS — R1032 Left lower quadrant pain: Secondary | ICD-10-CM | POA: Diagnosis present

## 2021-07-15 LAB — URINALYSIS, ROUTINE W REFLEX MICROSCOPIC
Bilirubin Urine: NEGATIVE
Glucose, UA: NEGATIVE mg/dL
Hgb urine dipstick: NEGATIVE
Ketones, ur: NEGATIVE mg/dL
Leukocytes,Ua: NEGATIVE
Nitrite: NEGATIVE
Protein, ur: NEGATIVE mg/dL
Specific Gravity, Urine: 1.018 (ref 1.005–1.030)
pH: 7 (ref 5.0–8.0)

## 2021-07-15 LAB — CBC WITH DIFFERENTIAL/PLATELET
Abs Immature Granulocytes: 0.03 10*3/uL (ref 0.00–0.07)
Basophils Absolute: 0 10*3/uL (ref 0.0–0.1)
Basophils Relative: 1 %
Eosinophils Absolute: 0.2 10*3/uL (ref 0.0–0.5)
Eosinophils Relative: 2 %
HCT: 34.7 % — ABNORMAL LOW (ref 36.0–46.0)
Hemoglobin: 11.6 g/dL — ABNORMAL LOW (ref 12.0–15.0)
Immature Granulocytes: 0 %
Lymphocytes Relative: 25 %
Lymphs Abs: 2.2 10*3/uL (ref 0.7–4.0)
MCH: 29.7 pg (ref 26.0–34.0)
MCHC: 33.4 g/dL (ref 30.0–36.0)
MCV: 89 fL (ref 80.0–100.0)
Monocytes Absolute: 0.7 10*3/uL (ref 0.1–1.0)
Monocytes Relative: 8 %
Neutro Abs: 5.5 10*3/uL (ref 1.7–7.7)
Neutrophils Relative %: 64 %
Platelets: 219 10*3/uL (ref 150–400)
RBC: 3.9 MIL/uL (ref 3.87–5.11)
RDW: 11.9 % (ref 11.5–15.5)
WBC: 8.6 10*3/uL (ref 4.0–10.5)
nRBC: 0 % (ref 0.0–0.2)

## 2021-07-15 LAB — COMPREHENSIVE METABOLIC PANEL
ALT: 20 U/L (ref 0–44)
AST: 18 U/L (ref 15–41)
Albumin: 3.6 g/dL (ref 3.5–5.0)
Alkaline Phosphatase: 49 U/L (ref 38–126)
Anion gap: 8 (ref 5–15)
BUN: 14 mg/dL (ref 8–23)
CO2: 24 mmol/L (ref 22–32)
Calcium: 8.6 mg/dL — ABNORMAL LOW (ref 8.9–10.3)
Chloride: 104 mmol/L (ref 98–111)
Creatinine, Ser: 0.74 mg/dL (ref 0.44–1.00)
GFR, Estimated: >60 mL/min (ref >60)
Glucose, Bld: 110 mg/dL — ABNORMAL HIGH (ref 70–99)
Potassium: 3.9 mmol/L (ref 3.5–5.1)
Sodium: 136 mmol/L (ref 135–145)
Total Bilirubin: 0.3 mg/dL (ref 0.3–1.2)
Total Protein: 6.9 g/dL (ref 6.5–8.1)

## 2021-07-15 LAB — LIPASE, BLOOD: Lipase: 34 U/L (ref 11–51)

## 2021-07-15 LAB — LACTIC ACID, PLASMA: Lactic Acid, Venous: 0.8 mmol/L (ref 0.5–1.9)

## 2021-07-15 MED ORDER — FENTANYL CITRATE PF 50 MCG/ML IJ SOSY
50.0000 ug | PREFILLED_SYRINGE | Freq: Once | INTRAMUSCULAR | Status: AC
  Administered 2021-07-15: 50 ug via INTRAVENOUS
  Filled 2021-07-15: qty 1

## 2021-07-15 MED ORDER — ONDANSETRON HCL 4 MG/2ML IJ SOLN
4.0000 mg | Freq: Once | INTRAMUSCULAR | Status: AC
  Administered 2021-07-15: 4 mg via INTRAVENOUS
  Filled 2021-07-15: qty 2

## 2021-07-15 MED ORDER — IOHEXOL 350 MG/ML SOLN
100.0000 mL | Freq: Once | INTRAVENOUS | Status: AC | PRN
  Administered 2021-07-15: 100 mL via INTRAVENOUS

## 2021-07-15 NOTE — ED Provider Notes (Signed)
?[facility] ?Provider Note ? ? ?CSN: [id number] ?Arrival date & time: 07/15/21  1313 ? ?  ? ?History ? ?Chief Complaint  ?Patient presents with  ? Abdominal Pain  ? ? ?Michelle Patton is a 66 y.o. female. ? ? ?Abdominal Pain ? ?Patient with medical history notable for hypertension, hyperlipidemia, chronic mesenteric ischemia with stent replacement 2 days prior presenting today with left lower quadrant abdominal pain.  States she felt improved initially after leaving the hospital 2 days ago, states this morning she had nausea and left lower quadrant pain.  The pain is sharp, does not radiate.  It is constant.  She took oxycodone which was prescribed to her after surgery which helped alleviate the pain.  She denies any constipation, bloody bowel movements, vomiting, fevers, chest pain, shortness of breath. ? ?Home Medications ?Prior to Admission medications   ?Medication Sig Start Date End Date Taking? Authorizing Provider  ?acetaminophen (TYLENOL) 500 MG tablet Take 1,000 mg by mouth every 6 (six) hours as needed for moderate pain.   Yes [provider]  ?aspirin EC 81 MG tablet Take 1 tablet (81 mg total) by mouth daily. Swallow whole. 12/16/19  Yes BranchAlphonse Guild, MD  ?Cholecalciferol (DIALYVITE VITAMIN D 5000) 125 MCG (5000 UT) capsule Take 5,000 Units by mouth daily.   Yes [provider]  ?clopidogrel (PLAVIX) 75 MG tablet Take 1 tablet (75 mg total) by mouth daily. 07/13/21  Yes Covington, Roselyn Reef R, NP  ?diclofenac (VOLTAREN) 75 MG EC tablet TAKE 1 TABLET TWICE A DAY ?Patient taking differently: Take 75 mg by mouth 2 (two) times daily. 03/03/21  Yes Carole Civil, MD  ?estradiol (ESTRACE) 2 MG tablet Take 1 tablet (2 mg total) by mouth daily. 01/19/21  Yes Wendie Agreste, MD  ?Flaxseed, Linseed, (FLAX SEED OIL PO) Take 1 capsule by mouth daily.   Yes [provider]  ?liothyronine (CYTOMEL) 5 MCG tablet Take 15 mcg by mouth daily. 06/19/21  Yes [provider]  ?losartan (COZAAR) 25 MG tablet TAKE 1 TABLET BY MOUTH DAILY ?Patient taking differently: Take 25 mg by mouth daily. 06/27/21  Yes Wendie Agreste, MD  ?metoprolol tartrate (LOPRESSOR) 25 MG tablet TAKE 1 TABLET TWICE A DAY ?Patient taking differently: Take 25 mg by mouth 2 (two) times daily. 03/29/21  Yes BranchAlphonse Guild, MD  ?Multiple Minerals (CALCIUM-MAGNESIUM-ZINC) TABS Take 1 tablet by mouth daily.   Yes [provider]  ?Multiple Vitamin (MULTIVITAMIN WITH MINERALS) TABS tablet Take 1 tablet by mouth daily.   Yes [provider]  ?NP THYROID 120 MG tablet Take 1 tablet (120 mg total) by mouth in the morning and at bedtime. ?Patient taking differently: Take 120 mg by mouth daily. 01/19/21  Yes Wendie Agreste, MD  ?oxyCODONE (OXY IR/ROXICODONE) 5 MG immediate release tablet Take 1 tablet (5 mg total) by mouth every 4 (four) hours as needed for up to 7 days for severe pain. 07/13/21 07/20/21 Yes Covington, Ardeth Perfect, NP  ?progesterone (PROMETRIUM) 200 MG capsule Take 2 capsules (400 mg total) by mouth daily. 01/19/21 07/15/21 Yes Wendie Agreste, MD  ?RABEprazole (ACIPHEX) 20 MG tablet TAKE ONE TABLET BY MOUTH TWICE A DAY BEFORE A MEAL ?Patient taking differently: Take 20 mg by mouth in the morning and at bedtime. 02/09/21  Yes Annitta Needs, NP  ?rosuvastatin (CRESTOR) 40 MG tablet Take 1 tablet (40 mg total) by mouth daily. 03/25/21 07/15/21 Yes Branch, Alphonse Guild, MD  ?  varenicline (CHANTIX PAK) 0.5 MG X 11 & 1 MG X 42 tablet Take 1 mg by mouth 2 (two) times daily. 06/16/21  Yes [provider]  ?clopidogrel (PLAVIX) 75 MG tablet Take 1 tablet (75 mg total) by mouth daily. ?Patient not taking: Reported on 07/15/2021 02/23/21   Monia Sabal, PA-C  ?ondansetron (ZOFRAN) 4 MG tablet Take 1 tablet (4 mg total) by mouth every 8 (eight) hours as needed for up to 14 days for nausea or vomiting. 07/13/21 07/27/21  Theresa Duty, NP  ?   ? ?Allergies    ?Penicillins and Sulfa  antibiotics   ? ?Review of Systems   ?Review of Systems  ?Gastrointestinal:  Positive for abdominal pain.  ? ?Physical Exam ?Updated Vital Signs ?BP (!) 168/92 (BP Location: Left Arm)   Pulse 82   Temp 97.6 ?F (36.4 ?C) (Oral)   Resp 18   Ht '5\' 6"'$  (1.676 m)   Wt 98.2 kg   SpO2 96%   BMI 34.93 kg/m?  ?Physical Exam ?Vitals and nursing note reviewed. Exam conducted with a chaperone present.  ?Constitutional:   ?   Appearance: Normal appearance. She is obese.  ?HENT:  ?   Head: Normocephalic and atraumatic.  ?Eyes:  ?   General: No scleral icterus.    ?   Right eye: No discharge.     ?   Left eye: No discharge.  ?   Extraocular Movements: Extraocular movements intact.  ?   Pupils: Pupils are equal, round, and reactive to light.  ?Cardiovascular:  ?   Rate and Rhythm: Normal rate and regular rhythm.  ?   Pulses: Normal pulses.  ?   Heart sounds: Normal heart sounds. No murmur heard. ?  No friction rub. No gallop.  ?Pulmonary:  ?   Effort: Pulmonary effort is normal. No respiratory distress.  ?   Breath sounds: Normal breath sounds.  ?Abdominal:  ?   General: Abdomen is flat. A surgical scar is present. Bowel sounds are normal. There is no distension.  ?   Palpations: Abdomen is soft.  ?   Tenderness: There is abdominal tenderness in the epigastric area and left lower quadrant. There is no guarding.  ?Skin: ?   General: Skin is warm and dry.  ?   Coloration: Skin is not jaundiced.  ?Neurological:  ?   Mental Status: She is alert. Mental status is at baseline.  ?   Coordination: Coordination normal.  ? ? ?ED Results / Procedures / Treatments   ?Labs ?(all labs ordered are listed, but only abnormal results are displayed) ?Labs Reviewed  ?CBC WITH DIFFERENTIAL/PLATELET - Abnormal; Notable for the following components:  ?    Result Value  ? Hemoglobin 11.6 (*)   ? HCT 34.7 (*)   ? All other components within normal limits  ?COMPREHENSIVE METABOLIC PANEL - Abnormal; Notable for the following components:  ? Glucose, Bld  110 (*)   ? Calcium 8.6 (*)   ? All other components within normal limits  ?URINALYSIS, ROUTINE W REFLEX MICROSCOPIC - Abnormal; Notable for the following components:  ? APPearance HAZY (*)   ? All other components within normal limits  ?LIPASE, BLOOD  ?LACTIC ACID, PLASMA  ? ? ?EKG ?None ? ?Radiology ?CT Angio Abd/Pel W and/or Wo Contrast ? ?Result Date: 07/15/2021 ?CLINICAL DATA:  Chronic mesenteric ischemia, left-sided abdominal pain, recent SMA stent placement EXAM: CTA ABDOMEN AND PELVIS WITHOUT AND WITH CONTRAST TECHNIQUE: Multidetector CT imaging of the abdomen and  pelvis was performed using the standard protocol during bolus administration of intravenous contrast. Multiplanar reconstructed images and MIPs were obtained and reviewed to evaluate the vascular anatomy. RADIATION DOSE REDUCTION: This exam was performed according to the departmental dose-optimization program which includes automated exposure control, adjustment of the mA and/or kV according to patient size and/or use of iterative reconstruction technique. CONTRAST:  133m OMNIPAQUE IOHEXOL 350 MG/ML SOLN COMPARISON:  06/09/2021 and previous FINDINGS: VASCULAR Aorta: Moderate partially calcified atheromatous plaque. No aneurysm, dissection, or stenosis. Celiac: Short-segment tapered stenosis just beyond its origin of at least mild severity, patent distally with unremarkable distal branch anatomy. SMA: There has been interval clearance of thrombus from the previously placed stent across the origin, and overlapping distal stent placement extending through the SMA proximal to its first visceral branch. Distal branches unremarkable. Renals: Both renal arteries are patent without evidence of aneurysm, dissection, vasculitis, fibromuscular dysplasia or significant stenosis. IMA: Widely patent, with enlarged visceral collateral supply to the SMA distribution. Inflow: Patent stent in the mid left external iliac artery. Scattered calcified plaque. Proximal  Outflow: Bilateral common femoral and visualized portions of the superficial and profunda femoral arteries are patent without evidence of aneurysm, dissection, vasculitis or significant stenosis. Veins: Patent

## 2021-07-15 NOTE — Telephone Encounter (Signed)
Patient called to report that she had acute onset abdominal pain this AM stating "I was doubled over in pain."  She filled her oxycodone prescription and did take a dose with some relief, however is still having some abdominal pain.  States that after procedure 3/22 she was feeling well and returned to her normal activities yesterday without issue. Has been eating and drinking with tolerance.  Has been voiding, having bowel movements without issue.  Discussed with Dr. Serafina Royals who recommends patient report to ED for emergent assessment and pain control.   ?Discussed with patient who is agreeable and understands recommendations.  She plans to have her husband bring her to [facility].  ? ?Brynda Greathouse, MS RD PA-C ? ? ?

## 2021-07-15 NOTE — Discharge Instructions (Signed)
Your work-up today was reassuring.  No signs of complication from the surgery, no signs of infection from the surgery.  Please continue taking your pain medicine as prescribed, return to the ED if things change or worsen significantly.  Otherwise follow-up with the surgeon as scheduled. ?

## 2021-07-15 NOTE — ED Triage Notes (Addendum)
Patient had stent placed in artery that feeds her colon and is having pain now.  +nausea  Patient had not taken oxycodone that was prescribed but took one today which made the pain a little better.  Patient called PA involved in surgery and wanted her to come to [facility] hospital.  Patient states "she wasn't going down to [place]". ?

## 2021-07-24 ENCOUNTER — Encounter: Payer: Self-pay | Admitting: Family Medicine

## 2021-08-09 ENCOUNTER — Encounter: Payer: Self-pay | Admitting: Family Medicine

## 2021-08-09 DIAGNOSIS — F17210 Nicotine dependence, cigarettes, uncomplicated: Secondary | ICD-10-CM

## 2021-08-09 MED ORDER — VARENICLINE TARTRATE 1 MG PO TABS: 1.0000 mg | ORAL_TABLET | Freq: Two times a day (BID) | ORAL | 0 refills | Status: DC

## 2021-08-15 ENCOUNTER — Encounter: Payer: Self-pay | Admitting: *Deleted

## 2021-08-15 ENCOUNTER — Ambulatory Visit
Admission: RE | Admit: 2021-08-15 | Discharge: 2021-08-15 | Disposition: A | Discharge status: Home / Self Care | Payer: BC Managed Care – PPO | Source: Ambulatory Visit | Attending: Student | Admitting: Student

## 2021-08-15 DIAGNOSIS — K559 Vascular disorder of intestine, unspecified: Secondary | ICD-10-CM

## 2021-08-15 HISTORY — PX: IR RADIOLOGIST EVAL & MGMT: IMG5224

## 2021-08-15 NOTE — Progress Notes (Signed)
? ?Referring Physician(s): ?Manus Rudd, MD ?  ?Reason for followup: ?1 months status post SMA stent recanalization and lower extremity angiogram ?  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   She is now status post SMA covered balloon-expandable stent placement on 10/29/20.  On follow up CTA in February, her stent was noted to be occluded.  At this time, she was asymptomatic, however between clinic visit and time of procedure she began to experience mild ischemic pain symptoms once again. ? ?On 07/12/21 she underwent successful SMA stent recanalization with laser atherectomy, placement of new 5x39 mm VBX.   At this time, we also did a diagnostic bilateral lower extremity angiogram which revealed a mild stenosis just proximal to the indwelling left external iliac stent, but otherwise widely patent inflow, outflow, and runoff vessels.  ? ?She presented to the [facility] ED on 3/24 with abdominal pain, and CTA demonstrated a widely patent stent without evidence of ischemia. ?  ?She presents today via virtual telephone visit for 1 month follow up after the above procedure. ?  ?She continues to endorse GI upset and discomfort, and attributes this to Chantix which she is still taking.  Overall, her prior ischemic abdominal pains are significantly improved.  She is limited to a bland diet currently.  No recent weight loss or blood in stool.  She is committed to taking Chantix for 1 more month, and hopes that her GI upset will resolve.  She has not smoked since starting Chantinx in February. ?  ?Since we last met, she has not been very active, and has a hard time qualifying her claudication symptoms.  She's looking forward to walking more this spring and summer tending to her garden. ?  ? ? ?Past Medical History:  ?Diagnosis Date  ? Anxiety   ? Aortic regurgitation 07/20/2014  ? Aortic  stenosis, severe 07/20/2014  ? Arthritis   ? DVT (deep venous thrombosis) ([place])   ? Family history of adverse reaction to anesthesia   ? Reports father deliurm in his 76's with CABG  ? GERD (gastroesophageal reflux disease)   ? History of pneumonia   ? Hyperlipidemia   ? Hypertension   ? Insomnia, unspecified   ? Muscle weakness (generalized)   ? Peripheral artery disease ([place])   ? 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  ? Redo sternotomy for 21 mm Medtronic Freestyle porcine aortic root graft w/ reimplantation of left main and right coronary arteries  ? Sleep apnea   ? diagnosed multiple years ago at [facility]  ? Supravalvular aortic stenosis, congenital - s/p repair during childhood   ? ? ?Past Surgical History:  ?Procedure Laterality Date  ? ABDOMINAL AORTAGRAM  06/24/2012  ? ABDOMINAL AORTAGRAM N/A 06/24/2012  ? Procedure: ABDOMINAL AORTAGRAM;  Surgeon: Angelia Mould, MD;  Location: [facility] CATH LAB;  Service: Cardiovascular;  Laterality: N/A;  ? AORTIC VALVE REPLACEMENT N/A 10/07/2014  ? Procedure: REDO AORTIC VALVE REPLACEMENT (AVR);  Surgeon: Rexene Alberts, MD;  Location: [place];  Service: Open Heart Surgery;  Laterality: N/A;  ? ASCENDING AORTIC ROOT REPLACEMENT N/A 10/07/2014  ? Procedure: ASCENDING AORTIC ROOT REPLACEMENT;  Surgeon: Rexene Alberts, MD;  Location: [place];  Service: Open Heart Surgery;  Laterality: N/A;  ? BIOPSY  09/27/2016  ? Procedure: BIOPSY;  Surgeon: Daneil Dolin, MD;  [facility];  Service: Endoscopy;;  colon  ? BIOPSY  10/18/2020  ? Procedure: BIOPSY;  Surgeon: Eloise Harman, DO;  [facility];  Service: Endoscopy;;  ? BREAST REDUCTION SURGERY Bilateral 01/21/2018  ? Procedure: BREAST REDUCTION WITH LIPOSUCTION;  Surgeon: Cristine Polio, MD;  Location: [place];  Service: Plastics;  Laterality: Bilateral;  ? [place]  ? CARPAL TUNNEL RELEASE Right 03/02/2020  ?  Procedure: CARPAL TUNNEL RELEASE;  Surgeon: Carole Civil, MD;  [facility];  Service: Orthopedics;  Laterality: Right;  ? CATARACT EXTRACTION W/PHACO Left 05/30/2019  ? Procedure: CATARACT EXTRACTION PHACO AND INTRAOCULAR LENS PLACEMENT (IOC) (CDE: 4.94  );  Surgeon: Baruch Goldmann, MD;  [facility];  Service: Ophthalmology;  Laterality: Left;  ? CATARACT EXTRACTION W/PHACO Right 06/16/2019  ? Procedure: CATARACT EXTRACTION PHACO AND INTRAOCULAR LENS PLACEMENT (IOC);  Surgeon: Baruch Goldmann, MD;  [facility];  Service: Ophthalmology;  Laterality: Right;  CDE: 4.64  ? CERVICAL FUSION    ? CHOLECYSTECTOMY    ? COLONOSCOPY WITH PROPOFOL N/A 09/27/2016  ? Dr. Gala Romney: Diverticulosis, several tubular adenomas removed ranging 4 to 7 mm in size, internal grade 1 hemorrhoids, terminal ileum normal, segmental biopsies negative for microscopic colitis.  Next colonoscopy June 2021  ? COLONOSCOPY WITH PROPOFOL N/A 05/06/2020  ? internal hemorrhoids, sigmoid diverticulosis. Surveillance colonoscopy due in 2027  ? ESOPHAGOGASTRODUODENOSCOPY (EGD) WITH PROPOFOL N/A 09/27/2016  ? Dr. Gala Romney: Small hiatal hernia, mild Schatzki ring status post disruption, LA grade a esophagitis  ? ESOPHAGOGASTRODUODENOSCOPY (EGD) WITH PROPOFOL N/A 10/18/2020  ? non-obstructing mild Schatzki ring, gastritis s/ biopsy. Negative H.pylori.  ? ILIAC ARTERY STENT Left 12/2007  ? IR ANGIOGRAM EXTREMITY BILATERAL  07/12/2021  ? IR ANGIOGRAM VISCERAL SELECTIVE  10/29/2020  ? IR ANGIOGRAM VISCERAL SELECTIVE  07/12/2021  ? IR IVUS EACH ADDITIONAL NON CORONARY VESSEL  07/12/2021  ? IR RADIOLOGIST EVAL & MGMT  10/26/2020  ? IR RADIOLOGIST EVAL & MGMT  11/10/2020  ? IR RADIOLOGIST EVAL & MGMT  02/09/2021  ? IR RADIOLOGIST EVAL & MGMT  06/16/2021  ? IR THROMBECT SEC MECH MOD SED  07/12/2021  ? IR TRANSCATH PLC STENT 1ST ART NOT LE CV CAR VERT CAR  10/29/2020  ? IR TRANSCATH PLC STENT 1ST ART NOT LE CV CAR VERT CAR  07/12/2021  ? IR US GUIDE VASC  ACCESS RIGHT  10/29/2020  ? IR US GUIDE VASC ACCESS RIGHT  07/12/2021  ? KNEE ARTHROSCOPY WITH MEDIAL MENISECTOMY Right 01/10/2018  ? Procedure: RIGHT KNEE ARTHROSCOPY WITH PARTIAL MEDIAL MENISECTOMY;  Surgeon: Carole Civil, MD;  [facility];  Service: Orthopedics;  Laterality: Right;  ? LEFT AND RIGHT HEART CATHETERIZATION WITH CORONARY ANGIOGRAM N/A 07/31/2014  ? Procedure: LEFT AND RIGHT HEART CATHETERIZATION WITH CORONARY ANGIOGRAM;  Surgeon: Burnell Blanks, MD;  Location: [facility] CATH LAB;  Service: Cardiovascular;  Laterality: N/A;  ? MALONEY DILATION N/A 09/27/2016  ? Procedure: MALONEY DILATION;  Surgeon: Daneil Dolin, MD;  [facility];  Service: Endoscopy;  Laterality: N/A;  ? POLYPECTOMY  09/27/2016  ? Procedure: POLYPECTOMY;  Surgeon: Daneil Dolin, MD;  [facility];  Service: Endoscopy;;  colon  ? ROTATOR CUFF REPAIR Bilateral   ? TEE WITHOUT CARDIOVERSION N/A 07/07/2014  ? Procedure: TRANSESOPHAGEAL ECHOCARDIOGRAM (TEE);  Surgeon: Arnoldo Lenis, MD;  [facility];  Service: Cardiology;  Laterality: N/A;  ? TEE WITHOUT CARDIOVERSION N/A 07/20/2014  ?  Procedure: TRANSESOPHAGEAL ECHOCARDIOGRAM (TEE) WITH PROPOFOL;  Surgeon: Arnoldo Lenis, MD;  [facility];  Service: Endoscopy;  Laterality: N/A;  ? TEE WITHOUT CARDIOVERSION N/A 10/07/2014  ? Procedure: TRANSESOPHAGEAL ECHOCARDIOGRAM (TEE);  Surgeon: Rexene Alberts, MD;  Location: [place];  Service: Open Heart Surgery;  Laterality: N/A;  ? ? ?Allergies: ?Penicillins and Sulfa antibiotics ? ?Medications: ?Prior to Admission medications   ?Medication Sig Start Date End Date Taking? Authorizing Provider  ?acetaminophen (TYLENOL) 500 MG tablet Take 1,000 mg by mouth every 6 (six) hours as needed for moderate pain.    [provider]  ?aspirin EC 81 MG tablet Take 1 tablet (81 mg total) by mouth daily. Swallow whole. 12/16/19   Arnoldo Lenis, MD  ?Cholecalciferol (DIALYVITE VITAMIN D  5000) 125 MCG (5000 UT) capsule Take 5,000 Units by mouth daily.    [provider]  ?clopidogrel (PLAVIX) 75 MG tablet Take 1 tablet (75 mg total) by mouth daily. ?Patient not taking: Reported on 07/15/2021

## 2021-08-23 ENCOUNTER — Encounter: Payer: Self-pay | Admitting: Gastroenterology

## 2021-08-23 ENCOUNTER — Ambulatory Visit (INDEPENDENT_AMBULATORY_CARE_PROVIDER_SITE_OTHER): Payer: BC Managed Care – PPO | Admitting: Gastroenterology

## 2021-08-23 VITALS — BP 130/86 | HR 77 | Temp 97.5°F | Ht 66.0 in | Wt 219.6 lb

## 2021-08-23 DIAGNOSIS — K551 Chronic vascular disorders of intestine: Secondary | ICD-10-CM | POA: Diagnosis not present

## 2021-08-23 DIAGNOSIS — K219 Gastro-esophageal reflux disease without esophagitis: Secondary | ICD-10-CM

## 2021-08-23 NOTE — Progress Notes (Signed)
Gastroenterology Office Note     Primary Care Physician:  Wendie Agreste, MD  Primary Gastroenterologist: Dr. Gala Romney    Chief Complaint   Chief Complaint  Patient presents with   Follow-up     History of Present Illness   Michelle Patton is a 66 y.o. female presenting today in follow-up with a history of abdominal pain, CDiff, found to have chronic mesenteric ischemia in work-up through our office. She underwent superior mesenteric artery covered stent placement 10/29/20 and repeat imaging 06/09/21 unfortunately showed the stent to be occluded. She presented to IR 07/12/21 for an elective mesenteric and lower extremity angiogram with SMA stent recanalization and balloon angioplasty of the proximal SMA.    Diarrhea has resolved since second stent. Has had pain since that time and Vascular is aware. Can't drink tea. Living on ginger ale and bread. Quit smoking. Taking Chantix. Painful when eating. BM a few times a day. Aciphex BID. GERD controlled. Overall, pain is improved.       Past Medical History:  Diagnosis Date   Anxiety    Aortic regurgitation 07/20/2014   Aortic stenosis, severe 07/20/2014   Arthritis    DVT (deep venous thrombosis) ([facility])    Family history of adverse reaction to anesthesia    Reports father deliurm in his 68's with CABG   GERD (gastroesophageal reflux disease)    History of pneumonia    Hyperlipidemia    Hypertension    Insomnia, unspecified    Muscle weakness (generalized)    Peripheral artery disease ([facility])    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   Redo sternotomy for 21 mm Medtronic Freestyle porcine aortic root graft w/ reimplantation of left main and right coronary arteries   Sleep apnea    diagnosed multiple years ago at [facility] aortic stenosis, congenital - s/p repair during childhood     Past Surgical History:  Procedure Laterality Date   ABDOMINAL AORTAGRAM   06/24/2012   ABDOMINAL AORTAGRAM N/A 06/24/2012   Procedure: ABDOMINAL Maxcine Ham;  Surgeon: Angelia Mould, MD;  Location: [facility] CATH LAB;  Service: Cardiovascular;  Laterality: N/A;   AORTIC VALVE REPLACEMENT N/A 10/07/2014   Procedure: REDO AORTIC VALVE REPLACEMENT (AVR);  Surgeon: Rexene Alberts, MD;  Location: [place];  Service: Open Heart Surgery;  Laterality: N/A;   ASCENDING AORTIC ROOT REPLACEMENT N/A 10/07/2014   Procedure: ASCENDING AORTIC ROOT REPLACEMENT;  Surgeon: Rexene Alberts, MD;  Location: [place];  Service: Open Heart Surgery;  Laterality: N/A;   BIOPSY  09/27/2016   Procedure: BIOPSY;  Surgeon: Daneil Dolin, MD;  [facility];  Service: Endoscopy;;  colon   BIOPSY  10/18/2020   Procedure: BIOPSY;  Surgeon: Eloise Harman, DO;  [facility];  Service: Endoscopy;;   BREAST REDUCTION SURGERY Bilateral 01/21/2018   Procedure: BREAST REDUCTION WITH LIPOSUCTION;  Surgeon: Cristine Polio, MD;  Location: [place];  Service: Plastics;  Laterality: Bilateral;   CARDIAC VALVE SURGERY  1968   CARPAL TUNNEL RELEASE Right 03/02/2020   Procedure: CARPAL TUNNEL RELEASE;  Surgeon: Carole Civil, MD;  [facility];  Service: Orthopedics;  Laterality: Right;   CATARACT EXTRACTION W/PHACO Left 05/30/2019   Procedure: CATARACT EXTRACTION PHACO AND INTRAOCULAR LENS PLACEMENT (IOC) (CDE: 4.94  );  Surgeon: Baruch Goldmann, MD;  [facility];  Service: Ophthalmology;  Laterality: Left;   CATARACT EXTRACTION  W/PHACO Right 06/16/2019   Procedure: CATARACT EXTRACTION PHACO AND INTRAOCULAR LENS PLACEMENT (IOC);  Surgeon: Baruch Goldmann, MD;  [facility];  Service: Ophthalmology;  Laterality: Right;  CDE: 4.64   CERVICAL FUSION     CHOLECYSTECTOMY     COLONOSCOPY WITH PROPOFOL N/A 09/27/2016   Dr. Gala Romney: Diverticulosis, several tubular adenomas removed ranging 4 to 7 mm in size, internal grade 1 hemorrhoids, terminal ileum normal,  segmental biopsies negative for microscopic colitis.  Next colonoscopy June 2021   COLONOSCOPY WITH PROPOFOL N/A 05/06/2020   internal hemorrhoids, sigmoid diverticulosis. Surveillance colonoscopy due in 2027   ESOPHAGOGASTRODUODENOSCOPY (EGD) WITH PROPOFOL N/A 09/27/2016   Dr. Gala Romney: Small hiatal hernia, mild Schatzki ring status post disruption, LA grade a esophagitis   ESOPHAGOGASTRODUODENOSCOPY (EGD) WITH PROPOFOL N/A 10/18/2020   non-obstructing mild Schatzki ring, gastritis s/ biopsy. Negative H.pylori.   ILIAC ARTERY STENT Left 12/2007   IR ANGIOGRAM EXTREMITY BILATERAL  07/12/2021   IR ANGIOGRAM VISCERAL SELECTIVE  10/29/2020   IR ANGIOGRAM VISCERAL SELECTIVE  07/12/2021   IR IVUS EACH ADDITIONAL NON CORONARY VESSEL  07/12/2021   IR RADIOLOGIST EVAL & MGMT  10/26/2020   IR RADIOLOGIST EVAL & MGMT  11/10/2020   IR RADIOLOGIST EVAL & MGMT  02/09/2021   IR RADIOLOGIST EVAL & MGMT  06/16/2021   IR RADIOLOGIST EVAL & MGMT  08/15/2021   IR THROMBECT SEC MECH MOD SED  07/12/2021   IR TRANSCATH PLC STENT 1ST ART NOT LE CV CAR VERT CAR  10/29/2020   IR TRANSCATH PLC STENT 1ST ART NOT LE CV CAR VERT CAR  07/12/2021   IR US GUIDE VASC ACCESS RIGHT  10/29/2020   IR US GUIDE VASC ACCESS RIGHT  07/12/2021   KNEE ARTHROSCOPY WITH MEDIAL MENISECTOMY Right 01/10/2018   Procedure: RIGHT KNEE ARTHROSCOPY WITH PARTIAL MEDIAL MENISECTOMY;  Surgeon: Carole Civil, MD;  [facility];  Service: Orthopedics;  Laterality: Right;   LEFT AND RIGHT HEART CATHETERIZATION WITH CORONARY ANGIOGRAM N/A 07/31/2014   Procedure: LEFT AND RIGHT HEART CATHETERIZATION WITH CORONARY ANGIOGRAM;  Surgeon: Burnell Blanks, MD;  Location: [facility] CATH LAB;  Service: Cardiovascular;  Laterality: N/A;   MALONEY DILATION N/A 09/27/2016   Procedure: Venia Minks DILATION;  Surgeon: Daneil Dolin, MD;  [facility];  Service: Endoscopy;  Laterality: N/A;   POLYPECTOMY  09/27/2016   Procedure: POLYPECTOMY;  Surgeon:  Daneil Dolin, MD;  [facility];  Service: Endoscopy;;  colon   ROTATOR CUFF REPAIR Bilateral    TEE WITHOUT CARDIOVERSION N/A 07/07/2014   Procedure: TRANSESOPHAGEAL ECHOCARDIOGRAM (TEE);  Surgeon: Arnoldo Lenis, MD;  [facility];  Service: Cardiology;  Laterality: N/A;   TEE WITHOUT CARDIOVERSION N/A 07/20/2014   Procedure: TRANSESOPHAGEAL ECHOCARDIOGRAM (TEE) WITH PROPOFOL;  Surgeon: Arnoldo Lenis, MD;  [facility]: [facility];  Service: Endoscopy;  Laterality: N/A;   TEE WITHOUT CARDIOVERSION N/A 10/07/2014   Procedure: TRANSESOPHAGEAL ECHOCARDIOGRAM (TEE);  Surgeon: Rexene Alberts, MD;  Location: [place];  Service: Open Heart Surgery;  Laterality: N/A;    Current Outpatient Medications  Medication Sig Dispense Refill   aspirin EC 81 MG tablet Take 1 tablet (81 mg total) by mouth daily. Swallow whole. 90 tablet 3   Cholecalciferol (DIALYVITE VITAMIN D 5000) 125 MCG (5000 UT) capsule Take 5,000 Units by mouth daily.     clopidogrel (PLAVIX) 75 MG tablet Take 1 tablet (75 mg total) by mouth daily. 90 tablet 3   diclofenac (VOLTAREN) 75  MG EC tablet TAKE 1 TABLET TWICE A DAY (Patient taking differently: Take 75 mg by mouth 2 (two) times daily.) 60 tablet 11   estradiol (ESTRACE) 2 MG tablet Take 1 tablet (2 mg total) by mouth daily. 30 tablet 3   Flaxseed, Linseed, (FLAX SEED OIL PO) Take 1 capsule by mouth daily.     liothyronine (CYTOMEL) 5 MCG tablet Take 15 mcg by mouth daily.     losartan (COZAAR) 25 MG tablet TAKE 1 TABLET BY MOUTH DAILY (Patient taking differently: Take 25 mg by mouth daily.) 90 tablet 3   metoprolol tartrate (LOPRESSOR) 25 MG tablet TAKE 1 TABLET TWICE A DAY (Patient taking differently: Take 25 mg by mouth 2 (two) times daily.) 180 tablet 3   Multiple Minerals (CALCIUM-MAGNESIUM-ZINC) TABS Take 1 tablet by mouth daily.     Multiple Vitamin (MULTIVITAMIN WITH MINERALS) TABS tablet Take 1 tablet by mouth daily.     NP THYROID 120 MG tablet Take  1 tablet (120 mg total) by mouth in the morning and at bedtime. (Patient taking differently: Take 120 mg by mouth daily.) 180 tablet 1   RABEprazole (ACIPHEX) 20 MG tablet TAKE ONE TABLET BY MOUTH TWICE A DAY BEFORE A MEAL (Patient taking differently: Take 20 mg by mouth in the morning and at bedtime.) 180 tablet 3   rosuvastatin (CRESTOR) 40 MG tablet Take 1 tablet (40 mg total) by mouth daily. 90 tablet 3   varenicline (CHANTIX CONTINUING MONTH PAK) 1 MG tablet Take 1 tablet (1 mg total) by mouth 2 (two) times daily. 60 tablet 0   varenicline (CHANTIX PAK) 0.5 MG X 11 & 1 MG X 42 tablet Take 1 mg by mouth 2 (two) times daily.     progesterone (PROMETRIUM) 200 MG capsule Take 2 capsules (400 mg total) by mouth daily. 180 capsule 1   No current facility-administered medications for this visit.    Allergies as of 08/23/2021 - Review Complete 08/23/2021  Allergen Reaction Noted   Penicillins Hives    Sulfa antibiotics Nausea And Vomiting 06/19/2012    Family History  Problem Relation Age of Onset   Hypertension Mother    Hypertension Father    Heart disease Father        before age 43   Other Father        varicose veins   COPD Father    Colon cancer Neg Hx    Celiac disease Neg Hx    Inflammatory bowel disease Neg Hx     Social History   Socioeconomic History   Marital status: Married    Spouse name: Not on file   Number of children: 0   Years of education: some coll   Highest education level: Not on file  Occupational History   Occupation: call center    Employer: [employer]    Comment: [employer]  Tobacco Use   Smoking status: Former    Packs/day: 0.50    Years: 40.00    Pack years: 20.00    Types: Cigarettes   Smokeless tobacco: Never  Vaping Use   Vaping Use: Never used  Substance and Sexual Activity   Alcohol use: Yes    Alcohol/week: 2.0 standard drinks    Types: 2 Cans of beer per week   Drug use: No   Sexual activity: Yes    Birth control/protection: None  Other  Topics Concern   Not on file  Social History Narrative   Patient is married   for 5  years . Patient works at [employer].. Some college education-did not Writer.Originally from [place].   Social Determinants of Health   Financial Resource Strain: Not on file  Food Insecurity: Not on file  Transportation Needs: Not on file  Physical Activity: Not on file  Stress: Not on file  Social Connections: Not on file  Intimate Partner Violence: Not on file     Review of Systems   Gen: Denies any fever, chills, fatigue, weight loss, lack of appetite.  CV: Denies chest pain, heart palpitations, peripheral edema, syncope.  Resp: Denies shortness of breath at rest or with exertion. Denies wheezing or cough.  GI: see HPI GU : Denies urinary burning, urinary frequency, urinary hesitancy MS: Denies joint pain, muscle weakness, cramps, or limitation of movement.  Derm: Denies rash, itching, dry skin Psych: Denies depression, anxiety, memory loss, and confusion Heme: Denies bruising, bleeding, and enlarged lymph nodes.   Physical Exam   BP 130/86   Pulse 77   Temp (!) 97.5 F (36.4 C)   Ht '5\' 6"'$  (1.676 m)   Wt 219 lb 9.6 oz (99.6 kg)   BMI 35.44 kg/m  General:   Alert and oriented. Pleasant and cooperative. Well-nourished and well-developed.  Head:  Normocephalic and atraumatic. Eyes:  Without icterus Abdomen:  +BS, soft, non-tender and non-distended. No HSM noted. No guarding or rebound. No masses appreciated.  Rectal:  Deferred  Msk:  Symmetrical without gross deformities. Normal posture. Extremities:  Without edema. Neurologic:  Alert and  oriented x4;  grossly normal neurologically. Skin:  Intact without significant lesions or rashes. Psych:  Alert and cooperative. Normal mood and affect.   Assessment   Michelle Patton is a 66 y.o. female presenting today in follow-up with a history of chronic GERD, abdominal pain, and found to have chronic mesenteric  ischemia s/p SMA stent placement July 2022 and then needed SMA stent recanalization and angioplasty of proximal SMA March 2023.    Overall, she is improved s/p intervention but still continues with intermittent abdominal pain, which Vascular is aware. I have discussed with her that if she has any severe, acute symptoms, she is to go directly to [place], as we do not have Vascular capabilities here. She has quit smoking and was applauded on this. Aciphex BID controlling GERD.     PLAN    Continue Aciphex BID Go to ED directly if severe abdominal pain Return in 1 year Continue with close follow-up with Vascular   Annitta Needs, PhD, ANP-BC [facility]

## 2021-08-23 NOTE — Patient Instructions (Signed)
Continue Aciphex twice a day. ? ?If you need me, please message me on MyChart! ? ?If you ever have worsening severe abdominal pain, go directly to the ED! ? ?I will see you in 1 year or sooner if needed! ? ?I enjoyed seeing you again today! As you know, I value our relationship and want to provide genuine, compassionate, and quality care. I welcome your feedback. If you receive a survey regarding your visit,  I greatly appreciate you taking time to fill this out. See you next time! ? ?Annitta Needs, PhD, ANP-BC ?[facility]  ? ?

## 2021-08-26 ENCOUNTER — Other Ambulatory Visit: Payer: Self-pay | Admitting: Interventional Radiology

## 2021-08-26 DIAGNOSIS — K559 Vascular disorder of intestine, unspecified: Secondary | ICD-10-CM

## 2021-09-28 ENCOUNTER — Ambulatory Visit: Payer: BC Managed Care – PPO | Admitting: Cardiology

## 2021-10-11 ENCOUNTER — Other Ambulatory Visit: Payer: Self-pay

## 2021-10-11 ENCOUNTER — Observation Stay (HOSPITAL_COMMUNITY)
Admission: EM | Admit: 2021-10-11 | Discharge: 2021-10-13 | Disposition: A | Discharge status: Home / Self Care | Payer: BC Managed Care – PPO | Attending: Internal Medicine | Admitting: Internal Medicine

## 2021-10-11 ENCOUNTER — Encounter (HOSPITAL_COMMUNITY): Payer: Self-pay | Admitting: Emergency Medicine

## 2021-10-11 ENCOUNTER — Emergency Department (HOSPITAL_COMMUNITY): Payer: BC Managed Care – PPO

## 2021-10-11 ENCOUNTER — Encounter (HOSPITAL_COMMUNITY): Payer: Self-pay

## 2021-10-11 ENCOUNTER — Ambulatory Visit (HOSPITAL_COMMUNITY)
Admission: RE | Admit: 2021-10-11 | Discharge: 2021-10-11 | Disposition: A | Discharge status: Home / Self Care | Payer: BC Managed Care – PPO | Source: Ambulatory Visit | Attending: Interventional Radiology | Admitting: Interventional Radiology

## 2021-10-11 DIAGNOSIS — I5022 Chronic systolic (congestive) heart failure: Secondary | ICD-10-CM | POA: Diagnosis not present

## 2021-10-11 DIAGNOSIS — T782XXD Anaphylactic shock, unspecified, subsequent encounter: Secondary | ICD-10-CM

## 2021-10-11 DIAGNOSIS — Z86718 Personal history of other venous thrombosis and embolism: Secondary | ICD-10-CM | POA: Diagnosis not present

## 2021-10-11 DIAGNOSIS — K559 Vascular disorder of intestine, unspecified: Secondary | ICD-10-CM | POA: Insufficient documentation

## 2021-10-11 DIAGNOSIS — K55059 Acute (reversible) ischemia of intestine, part and extent unspecified: Secondary | ICD-10-CM | POA: Diagnosis not present

## 2021-10-11 DIAGNOSIS — K219 Gastro-esophageal reflux disease without esophagitis: Secondary | ICD-10-CM | POA: Diagnosis present

## 2021-10-11 DIAGNOSIS — Z87891 Personal history of nicotine dependence: Secondary | ICD-10-CM | POA: Diagnosis not present

## 2021-10-11 DIAGNOSIS — Z954 Presence of other heart-valve replacement: Secondary | ICD-10-CM | POA: Insufficient documentation

## 2021-10-11 DIAGNOSIS — Z79899 Other long term (current) drug therapy: Secondary | ICD-10-CM | POA: Diagnosis not present

## 2021-10-11 DIAGNOSIS — Z7902 Long term (current) use of antithrombotics/antiplatelets: Secondary | ICD-10-CM | POA: Insufficient documentation

## 2021-10-11 DIAGNOSIS — I10 Essential (primary) hypertension: Secondary | ICD-10-CM | POA: Diagnosis not present

## 2021-10-11 DIAGNOSIS — Z7982 Long term (current) use of aspirin: Secondary | ICD-10-CM | POA: Insufficient documentation

## 2021-10-11 DIAGNOSIS — I110 Hypertensive heart disease with heart failure: Secondary | ICD-10-CM | POA: Insufficient documentation

## 2021-10-11 DIAGNOSIS — T782XXA Anaphylactic shock, unspecified, initial encounter: Secondary | ICD-10-CM | POA: Diagnosis not present

## 2021-10-11 DIAGNOSIS — R220 Localized swelling, mass and lump, head: Secondary | ICD-10-CM | POA: Diagnosis present

## 2021-10-11 DIAGNOSIS — Z95828 Presence of other vascular implants and grafts: Secondary | ICD-10-CM | POA: Diagnosis not present

## 2021-10-11 DIAGNOSIS — E785 Hyperlipidemia, unspecified: Secondary | ICD-10-CM | POA: Diagnosis present

## 2021-10-11 LAB — CBC WITH DIFFERENTIAL/PLATELET
Abs Immature Granulocytes: 0.02 10*3/uL (ref 0.00–0.07)
Basophils Absolute: 0 10*3/uL (ref 0.0–0.1)
Basophils Relative: 1 %
Eosinophils Absolute: 0.1 10*3/uL (ref 0.0–0.5)
Eosinophils Relative: 2 %
HCT: 36.3 % (ref 36.0–46.0)
Hemoglobin: 12 g/dL (ref 12.0–15.0)
Immature Granulocytes: 0 %
Lymphocytes Relative: 40 %
Lymphs Abs: 2.5 10*3/uL (ref 0.7–4.0)
MCH: 29.2 pg (ref 26.0–34.0)
MCHC: 33.1 g/dL (ref 30.0–36.0)
MCV: 88.3 fL (ref 80.0–100.0)
Monocytes Absolute: 0.6 10*3/uL (ref 0.1–1.0)
Monocytes Relative: 9 %
Neutro Abs: 3.1 10*3/uL (ref 1.7–7.7)
Neutrophils Relative %: 48 %
Platelets: 219 10*3/uL (ref 150–400)
RBC: 4.11 MIL/uL (ref 3.87–5.11)
RDW: 12.8 % (ref 11.5–15.5)
WBC: 6.4 10*3/uL (ref 4.0–10.5)
nRBC: 0 % (ref 0.0–0.2)

## 2021-10-11 LAB — COMPREHENSIVE METABOLIC PANEL
ALT: 16 U/L (ref 0–44)
AST: 20 U/L (ref 15–41)
Albumin: 3 g/dL — ABNORMAL LOW (ref 3.5–5.0)
Alkaline Phosphatase: 43 U/L (ref 38–126)
Anion gap: 5 (ref 5–15)
BUN: 15 mg/dL (ref 8–23)
CO2: 22 mmol/L (ref 22–32)
Calcium: 8.1 mg/dL — ABNORMAL LOW (ref 8.9–10.3)
Chloride: 108 mmol/L (ref 98–111)
Creatinine, Ser: 0.92 mg/dL (ref 0.44–1.00)
GFR, Estimated: >60 mL/min (ref >60)
Glucose, Bld: 133 mg/dL — ABNORMAL HIGH (ref 70–99)
Potassium: 4.3 mmol/L (ref 3.5–5.1)
Sodium: 135 mmol/L (ref 135–145)
Total Bilirubin: 0.2 mg/dL — ABNORMAL LOW (ref 0.3–1.2)
Total Protein: 5.6 g/dL — ABNORMAL LOW (ref 6.5–8.1)

## 2021-10-11 LAB — POCT I-STAT CREATININE: Creatinine, Ser: 0.9 mg/dL (ref 0.44–1.00)

## 2021-10-11 LAB — TROPONIN I (HIGH SENSITIVITY)
Troponin I (High Sensitivity): 4 ng/L (ref <18)
Troponin I (High Sensitivity): 4 ng/L (ref <18)

## 2021-10-11 LAB — BRAIN NATRIURETIC PEPTIDE: B Natriuretic Peptide: 39 pg/mL (ref 0.0–100.0)

## 2021-10-11 MED ORDER — LIOTHYRONINE SODIUM 25 MCG PO TABS
25.0000 ug | ORAL_TABLET | Freq: Every day | ORAL | Status: DC
  Administered 2021-10-12: 25 ug via ORAL
  Filled 2021-10-11 (×3): qty 1

## 2021-10-11 MED ORDER — SODIUM CHLORIDE 0.9% FLUSH
3.0000 mL | Freq: Two times a day (BID) | INTRAVENOUS | Status: DC
  Administered 2021-10-11 – 2021-10-12 (×2): 3 mL via INTRAVENOUS

## 2021-10-11 MED ORDER — PREDNISONE 20 MG PO TABS
60.0000 mg | ORAL_TABLET | ORAL | Status: AC
Start: 2021-10-11 — End: 2021-10-11
  Administered 2021-10-11: 60 mg via ORAL
  Filled 2021-10-11: qty 3

## 2021-10-11 MED ORDER — IOHEXOL 350 MG/ML SOLN
100.0000 mL | Freq: Once | INTRAVENOUS | Status: AC | PRN
  Administered 2021-10-11: 80 mL via INTRAVENOUS

## 2021-10-11 MED ORDER — ENOXAPARIN SODIUM 60 MG/0.6ML IJ SOSY
50.0000 mg | PREFILLED_SYRINGE | INTRAMUSCULAR | Status: DC
  Administered 2021-10-12 (×2): 50 mg via SUBCUTANEOUS
  Filled 2021-10-11 (×3): qty 0.6

## 2021-10-11 MED ORDER — CLOPIDOGREL BISULFATE 75 MG PO TABS
75.0000 mg | ORAL_TABLET | Freq: Every day | ORAL | Status: DC
  Administered 2021-10-11: 75 mg via ORAL
  Filled 2021-10-11 (×2): qty 1

## 2021-10-11 MED ORDER — FAMOTIDINE IN NACL 20-0.9 MG/50ML-% IV SOLN
20.0000 mg | Freq: Once | INTRAVENOUS | Status: AC
  Administered 2021-10-11: 20 mg via INTRAVENOUS

## 2021-10-11 MED ORDER — METOPROLOL TARTRATE 25 MG PO TABS
25.0000 mg | ORAL_TABLET | Freq: Two times a day (BID) | ORAL | Status: DC
  Administered 2021-10-11 – 2021-10-12 (×3): 25 mg via ORAL
  Filled 2021-10-11 (×4): qty 1

## 2021-10-11 MED ORDER — SODIUM CHLORIDE 0.9 % IV BOLUS
1000.0000 mL | Freq: Once | INTRAVENOUS | Status: AC
  Administered 2021-10-11: 1000 mL via INTRAVENOUS

## 2021-10-11 MED ORDER — PREDNISONE 20 MG PO TABS
40.0000 mg | ORAL_TABLET | Freq: Every day | ORAL | Status: AC
Start: 2021-10-12 — End: 2021-10-12
  Administered 2021-10-12: 40 mg via ORAL
  Filled 2021-10-11: qty 2

## 2021-10-11 MED ORDER — ROSUVASTATIN CALCIUM 20 MG PO TABS
40.0000 mg | ORAL_TABLET | Freq: Every day | ORAL | Status: DC
  Administered 2021-10-11: 40 mg via ORAL
  Filled 2021-10-11 (×2): qty 2

## 2021-10-11 MED ORDER — PANTOPRAZOLE SODIUM 40 MG PO TBEC
40.0000 mg | DELAYED_RELEASE_TABLET | Freq: Every day | ORAL | Status: DC
  Administered 2021-10-12: 40 mg via ORAL
  Filled 2021-10-11: qty 1

## 2021-10-11 MED ORDER — EPINEPHRINE 0.3 MG/0.3ML IJ SOAJ
0.3000 mg | Freq: Once | INTRAMUSCULAR | Status: AC
  Administered 2021-10-11: 0.3 mg via INTRAMUSCULAR
  Filled 2021-10-11: qty 0.3

## 2021-10-11 MED ORDER — ACETAMINOPHEN 650 MG RE SUPP: 650.0000 mg | Freq: Four times a day (QID) | RECTAL | Status: DC | PRN

## 2021-10-11 MED ORDER — DIPHENHYDRAMINE HCL 50 MG/ML IJ SOLN
50.0000 mg | Freq: Once | INTRAMUSCULAR | Status: AC
  Administered 2021-10-11: 50 mg via INTRAVENOUS

## 2021-10-11 MED ORDER — SODIUM CHLORIDE 0.9% FLUSH: 3.0000 mL | INTRAVENOUS | Status: DC | PRN

## 2021-10-11 MED ORDER — METHYLPREDNISOLONE SODIUM SUCC 125 MG IJ SOLR
125.0000 mg | Freq: Once | INTRAMUSCULAR | Status: AC
  Administered 2021-10-11: 125 mg via INTRAVENOUS

## 2021-10-11 MED ORDER — SODIUM CHLORIDE 0.9 % IV BOLUS: 500.0000 mL | Freq: Once | INTRAVENOUS | Status: DC

## 2021-10-11 MED ORDER — LOSARTAN POTASSIUM 50 MG PO TABS
25.0000 mg | ORAL_TABLET | Freq: Every day | ORAL | Status: DC
  Administered 2021-10-12: 25 mg via ORAL
  Filled 2021-10-11 (×2): qty 1

## 2021-10-11 MED ORDER — THYROID 30 MG PO TABS
120.0000 mg | ORAL_TABLET | Freq: Every day | ORAL | Status: DC
  Administered 2021-10-12: 120 mg via ORAL
  Filled 2021-10-11: qty 4
  Filled 2021-10-11: qty 1
  Filled 2021-10-11: qty 4

## 2021-10-11 MED ORDER — SODIUM CHLORIDE 0.9 % IV SOLN: INTRAVENOUS | Status: DC

## 2021-10-11 MED ORDER — SODIUM CHLORIDE 0.9 % IV SOLN: 250.0000 mL | INTRAVENOUS | Status: DC | PRN

## 2021-10-11 MED ORDER — TRAZODONE HCL 50 MG PO TABS: 25.0000 mg | ORAL_TABLET | Freq: Every evening | ORAL | Status: DC | PRN

## 2021-10-11 MED ORDER — ACETAMINOPHEN 325 MG PO TABS
650.0000 mg | ORAL_TABLET | Freq: Four times a day (QID) | ORAL | Status: DC | PRN
  Administered 2021-10-12 (×2): 650 mg via ORAL
  Filled 2021-10-11 (×2): qty 2

## 2021-10-11 MED ORDER — IOHEXOL 300 MG/ML  SOLN: 100.0000 mL | Freq: Once | INTRAMUSCULAR | Status: DC | PRN

## 2021-10-11 MED ORDER — PROGESTERONE 200 MG PO CAPS
400.0000 mg | ORAL_CAPSULE | Freq: Every day | ORAL | Status: DC
  Filled 2021-10-11 (×3): qty 2

## 2021-10-11 MED ORDER — ESTRADIOL 1 MG PO TABS
2.0000 mg | ORAL_TABLET | Freq: Every day | ORAL | Status: DC
  Administered 2021-10-12: 2 mg via ORAL
  Filled 2021-10-11: qty 2
  Filled 2021-10-11: qty 1
  Filled 2021-10-11: qty 2

## 2021-10-11 NOTE — Assessment & Plan Note (Signed)
Stable with no acute complaints  Plan Continue home medications

## 2021-10-11 NOTE — H&P (Signed)
History and Physical    Michelle Patton LOV:[id number] DOB: Feb 03, 1956 DOA: 10/11/2021  DOS: the patient was seen and examined on 10/11/2021  PCP: Wendie Agreste, MD   Patient coming from:  brought to ED from Radiology  I have personally briefly reviewed patient's old medical records in [facility]  Michelle Patton, a 66 y/o  with h/o aortic patch at 10, redo heart surgery, HLD, HTN, PAD with h/o iliac artery stent 2009. For approximately 18 months she has been followed for mesenteric ischemia. She had DES SMA 10/29/20 which by arterigram 06/09/21 was occlued. March 21st she underwent recanalization o the SMA stent along with angioplasty. Her symptoms improved but remained a problem for her with eating. She was for followed angiogram today to reassess SMA. At the end of the procedure she c/o itching. She was noted to be flushed and began having respiratory difficulty. She was immediately transferred to the [facility].   ED Course: At presentation BP 83/69, patient was erythematous, she c/o chest pressure and difficulty breathing. Perioral edema was observed along with swelling of the tongue and hands. She was immediately treated with '125mg'$  Solumedrol IV, 50 mg Benadryl IV, 20 mg Pepcid IV and received EpiPen injection. She had a good response to treatment. TRH called to admit for observation and continued treatment.   Review of Systems:  Review of Systems  Constitutional:  Negative for chills, fever and weight loss.  HENT: Negative.    Eyes: Negative.   Respiratory:  Positive for shortness of breath and wheezing.        Respiratory symptoms noted at presentation to ED were resolved at time of exam.  Cardiovascular:  Negative for chest pain and palpitations.  Gastrointestinal:  Positive for abdominal pain. Negative for heartburn, nausea and vomiting.       Chronic mild abdominal discomfort   Genitourinary: Negative.   Musculoskeletal: Negative.   Skin:        Erythematous rash and urticaria as  reported by EDP was resolved at time of exam  Neurological: Negative.   Endo/Heme/Allergies: Negative.   Psychiatric/Behavioral: Negative.      Past Medical History:  Diagnosis Date   Anxiety    Aortic regurgitation 07/20/2014   Aortic stenosis, severe 07/20/2014   Arthritis    DVT (deep venous thrombosis) (HCC)    Family history of adverse reaction to anesthesia    Reports father deliurm in his 41's with CABG   GERD (gastroesophageal reflux disease)    History of pneumonia    Hyperlipidemia    Hypertension    Insomnia, unspecified    Muscle weakness (generalized)    Peripheral artery disease (HCC)    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   Redo sternotomy for 21 mm Medtronic Freestyle porcine aortic root graft w/ reimplantation of left main and right coronary arteries   Sleep apnea    diagnosed multiple years ago at [facility] aortic stenosis, congenital - s/p repair during childhood     Past Surgical History:  Procedure Laterality Date   ABDOMINAL AORTAGRAM  06/24/2012   ABDOMINAL AORTAGRAM N/A 06/24/2012   Procedure: ABDOMINAL Maxcine Ham;  Surgeon: Angelia Mould, MD;  Location: [facility] CATH LAB;  Service: Cardiovascular;  Laterality: N/A;   AORTIC VALVE REPLACEMENT N/A 10/07/2014   Procedure: REDO AORTIC VALVE REPLACEMENT (AVR);  Surgeon: Rexene Alberts, MD;  Location: [place];  Service: Open Heart Surgery;  Laterality: N/A;   ASCENDING AORTIC  ROOT REPLACEMENT N/A 10/07/2014   Procedure: ASCENDING AORTIC ROOT REPLACEMENT;  Surgeon: Rexene Alberts, MD;  Location: [place];  Service: Open Heart Surgery;  Laterality: N/A;   BIOPSY  09/27/2016   Procedure: BIOPSY;  Surgeon: Daneil Dolin, MD;  [facility];  Service: Endoscopy;;  colon   BIOPSY  10/18/2020   Procedure: BIOPSY;  Surgeon: Eloise Harman, DO;  [facility];  Service: Endoscopy;;   BREAST REDUCTION SURGERY Bilateral 01/21/2018    Procedure: BREAST REDUCTION WITH LIPOSUCTION;  Surgeon: Cristine Polio, MD;  Location: [place];  Service: Plastics;  Laterality: Bilateral;   CARDIAC VALVE SURGERY  1968   CARPAL TUNNEL RELEASE Right 03/02/2020   Procedure: CARPAL TUNNEL RELEASE;  Surgeon: Carole Civil, MD;  Location: [facility];  Service: Orthopedics;  Laterality: Right;   CATARACT EXTRACTION W/PHACO Left 05/30/2019   Procedure: CATARACT EXTRACTION PHACO AND INTRAOCULAR LENS PLACEMENT (IOC) (CDE: 4.94  );  Surgeon: Baruch Goldmann, MD;  Location[facility];  Service: Ophthalmology;  Laterality: Left;   CATARACT EXTRACTION W/PHACO Right 06/16/2019   Procedure: CATARACT EXTRACTION PHACO AND INTRAOCULAR LENS PLACEMENT (IOC);  Surgeon: Baruch Goldmann, MD;  [facility];  Service: Ophthalmology;  Laterality: Right;  CDE: 4.64   CERVICAL FUSION     CHOLECYSTECTOMY     COLONOSCOPY WITH PROPOFOL N/A 09/27/2016   Dr. Gala Romney: Diverticulosis, several tubular adenomas removed ranging 4 to 7 mm in size, internal grade 1 hemorrhoids, terminal ileum normal, segmental biopsies negative for microscopic colitis.  Next colonoscopy June 2021   COLONOSCOPY WITH PROPOFOL N/A 05/06/2020   internal hemorrhoids, sigmoid diverticulosis. Surveillance colonoscopy due in 2027   ESOPHAGOGASTRODUODENOSCOPY (EGD) WITH PROPOFOL N/A 09/27/2016   Dr. Gala Romney: Small hiatal hernia, mild Schatzki ring status post disruption, LA grade a esophagitis   ESOPHAGOGASTRODUODENOSCOPY (EGD) WITH PROPOFOL N/A 10/18/2020   non-obstructing mild Schatzki ring, gastritis s/ biopsy. Negative H.pylori.   ILIAC ARTERY STENT Left 12/2007   IR ANGIOGRAM EXTREMITY BILATERAL  07/12/2021   IR ANGIOGRAM VISCERAL SELECTIVE  10/29/2020   IR ANGIOGRAM VISCERAL SELECTIVE  07/12/2021   IR IVUS EACH ADDITIONAL NON CORONARY VESSEL  07/12/2021   IR RADIOLOGIST EVAL & MGMT  10/26/2020   IR RADIOLOGIST EVAL & MGMT  11/10/2020   IR RADIOLOGIST EVAL & MGMT  02/09/2021   IR  RADIOLOGIST EVAL & MGMT  06/16/2021   IR RADIOLOGIST EVAL & MGMT  08/15/2021   IR THROMBECT SEC MECH MOD SED  07/12/2021   IR TRANSCATH PLC STENT 1ST ART NOT LE CV CAR VERT CAR  10/29/2020   IR TRANSCATH PLC STENT 1ST ART NOT LE CV CAR VERT CAR  07/12/2021   IR US GUIDE VASC ACCESS RIGHT  10/29/2020   IR US GUIDE VASC ACCESS RIGHT  07/12/2021   KNEE ARTHROSCOPY WITH MEDIAL MENISECTOMY Right 01/10/2018   Procedure: RIGHT KNEE ARTHROSCOPY WITH PARTIAL MEDIAL MENISECTOMY;  Surgeon: Carole Civil, MD;  [facility];  Service: Orthopedics;  Laterality: Right;   LEFT AND RIGHT HEART CATHETERIZATION WITH CORONARY ANGIOGRAM N/A 07/31/2014   Procedure: LEFT AND RIGHT HEART CATHETERIZATION WITH CORONARY ANGIOGRAM;  Surgeon: Burnell Blanks, MD;  Location: [facility] CATH LAB;  Service: Cardiovascular;  Laterality: N/A;   MALONEY DILATION N/A 09/27/2016   Procedure: Venia Minks DILATION;  Surgeon: Daneil Dolin, MD;  [facility];  Service: Endoscopy;  Laterality: N/A;   POLYPECTOMY  09/27/2016   Procedure: POLYPECTOMY;  Surgeon: Daneil Dolin, MD;  [facility];  Service: Endoscopy;;  colon   ROTATOR CUFF REPAIR Bilateral    TEE WITHOUT CARDIOVERSION N/A 07/07/2014   Procedure: TRANSESOPHAGEAL ECHOCARDIOGRAM (TEE);  Surgeon: Arnoldo Lenis, MD;  [facility];  Service: Cardiology;  Laterality: N/A;   TEE WITHOUT CARDIOVERSION N/A 07/20/2014   Procedure: TRANSESOPHAGEAL ECHOCARDIOGRAM (TEE) WITH PROPOFOL;  Surgeon: Arnoldo Lenis, MD;  [facility];  Service: Endoscopy;  Laterality: N/A;   TEE WITHOUT CARDIOVERSION N/A 10/07/2014   Procedure: TRANSESOPHAGEAL ECHOCARDIOGRAM (TEE);  Surgeon: Rexene Alberts, MD;  Location: [place];  Service: Open Heart Surgery;  Laterality: N/A;   Soc Hx  - 1st marriage was brief ending in divorce. After being single for many years remarried 21 years ago. No children. 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 LIves with her  husband.     reports that she has quit smoking. Her smoking use included cigarettes. She has a 20.00 pack-year smoking history. She has never used smokeless tobacco. She reports current alcohol use of about 2.0 standard drinks of alcohol per week. She reports that she does not use drugs.  Allergies  Allergen Reactions   Iodinated Contrast Media Anaphylaxis, Shortness Of Breath, Itching, Swelling and Other (See Comments)    Patient was given Omni 350 and suffered from itching, chest pain and shortness of breath. Patient was taken to ED after onset of reaction. 10/11/2021  MD zackowski noted pt had tongue and throat swelling, had to give pt epinephrine    Penicillins Hives    Has patient had a PCN reaction causing immediate rash, facial/tongue/throat swelling, SOB or lightheadedness with hypotension: Yes Has patient had a PCN reaction causing severe rash involving mucus membranes or skin necrosis: No Has patient had a PCN reaction that required hospitalization No Has patient had a PCN reaction occurring within the last 10 years: No If all of the above answers are "NO", then may proceed with Cephalosporin use.  Have taken Keflex before without any adverse reaction.    Sulfa Antibiotics Nausea And Vomiting    Family History  Problem Relation Age of Onset   Hypertension Mother    Hypertension Father    Heart disease Father        before age 21   Other Father        varicose veins   COPD Father    Colon cancer Neg Hx    Celiac disease Neg Hx    Inflammatory bowel disease Neg Hx     Prior to Admission medications   Medication Sig Start Date End Date Taking? Authorizing Provider  aspirin EC 81 MG tablet Take 1 tablet (81 mg total) by mouth daily. Swallow whole. 12/16/19  Yes Branch, Alphonse Guild, MD  Cholecalciferol (DIALYVITE VITAMIN D 5000) 125 MCG (5000 UT) capsule Take 5,000 Units by mouth daily.   Yes [provider]  clopidogrel (PLAVIX) 75 MG tablet Take 1 tablet (75 mg  total) by mouth daily. 07/13/21  Yes Covington, Roselyn Reef R, NP  diclofenac (VOLTAREN) 75 MG EC tablet TAKE 1 TABLET TWICE A DAY Patient taking differently: Take 75 mg by mouth 2 (two) times daily. 03/03/21  Yes Carole Civil, MD  estradiol (ESTRACE) 2 MG tablet Take 1 tablet (2 mg total) by mouth daily. 01/19/21  Yes Wendie Agreste, MD  Flaxseed, Linseed, (FLAX SEED OIL PO) Take 1 capsule by mouth daily.   Yes [provider]  liothyronine (CYTOMEL) 25 MCG tablet Take 25 mcg by  mouth daily. 10/06/21  Yes [provider]  losartan (COZAAR) 25 MG tablet TAKE 1 TABLET BY MOUTH DAILY Patient taking differently: Take 25 mg by mouth daily. 06/27/21  Yes Wendie Agreste, MD  metoprolol tartrate (LOPRESSOR) 25 MG tablet TAKE 1 TABLET TWICE A DAY Patient taking differently: Take 25 mg by mouth 2 (two) times daily. 03/29/21  Yes BranchAlphonse Guild, MD  Multiple Minerals (CALCIUM-MAGNESIUM-ZINC) TABS Take 1 tablet by mouth daily.   Yes [provider]  Multiple Vitamin (MULTIVITAMIN WITH MINERALS) TABS tablet Take 1 tablet by mouth daily.   Yes [provider]  NP THYROID 120 MG tablet Take 1 tablet (120 mg total) by mouth in the morning and at bedtime. Patient taking differently: Take 120 mg by mouth daily. 01/19/21  Yes Wendie Agreste, MD  progesterone (PROMETRIUM) 200 MG capsule Take 2 capsules (400 mg total) by mouth daily. 01/19/21 10/11/21 Yes Wendie Agreste, MD  RABEprazole (ACIPHEX) 20 MG tablet TAKE ONE TABLET BY MOUTH TWICE A DAY BEFORE A MEAL Patient taking differently: Take 20 mg by mouth in the morning and at bedtime. 02/09/21  Yes Annitta Needs, NP  rosuvastatin (CRESTOR) 40 MG tablet Take 1 tablet (40 mg total) by mouth daily. 03/25/21 10/11/21 Yes Arnoldo Lenis, MD    Physical Exam: Vitals:   10/11/21 1900 10/11/21 1930 10/11/21 2000 10/11/21 2030  BP: (!) 134/56 (!) 135/54 138/61 (!) 145/61  Pulse: 74 75 89 81  Resp: '16 17 19 16  '$ SpO2: 95%  96% 95% 98%    Physical Exam Vitals and nursing note reviewed.  Constitutional:      General: She is not in acute distress.    Appearance: Normal appearance. She is obese. She is not toxic-appearing or diaphoretic.  HENT:     Head: Normocephalic and atraumatic.     Nose: Nose normal.     Mouth/Throat:     Mouth: Mucous membranes are moist.     Pharynx: Oropharynx is clear.  Eyes:     Extraocular Movements: Extraocular movements intact.     Conjunctiva/sclera: Conjunctivae normal.     Pupils: Pupils are equal, round, and reactive to light.  Neck:     Vascular: No carotid bruit.  Cardiovascular:     Rate and Rhythm: Normal rate and regular rhythm.     Heart sounds: No murmur heard.    Comments: 2+ radial pulses, 1+ DP pulses Pulmonary:     Effort: Pulmonary effort is normal. No respiratory distress.     Breath sounds: Normal breath sounds. No wheezing or rhonchi.  Abdominal:     General: There is no distension.     Tenderness: There is no abdominal tenderness. There is no guarding.     Comments: Obese, hypoactive BS  Musculoskeletal:        General: Normal range of motion.     Cervical back: Normal range of motion and neck supple.  Lymphadenopathy:     Cervical: No cervical adenopathy.  Skin:    General: Skin is warm and dry.  Neurological:     General: No focal deficit present.     Mental Status: She is alert and oriented to person, place, and time.  Psychiatric:        Mood and Affect: Mood normal.        Behavior: Behavior normal.      Labs on Admission: I have personally reviewed following labs and imaging studies  CBC: Recent Labs  Lab  10/11/21 1701  WBC 6.4  NEUTROABS 3.1  HGB 12.0  HCT 36.3  MCV 88.3  PLT 387   Basic Metabolic Panel: Recent Labs  Lab 10/11/21 1553 10/11/21 1701  NA  --  135  K  --  4.3  CL  --  108  CO2  --  22  GLUCOSE  --  133*  BUN  --  15  CREATININE 0.90 0.92  CALCIUM  --  8.1*   GFR: CrCl cannot be calculated  (Unknown ideal weight.). Liver Function Tests: Recent Labs  Lab 10/11/21 1701  AST 20  ALT 16  ALKPHOS 43  BILITOT 0.2*  PROT 5.6*  ALBUMIN 3.0*   No results for input(s): "LIPASE", "AMYLASE" in the last 168 hours. No results for input(s): "AMMONIA" in the last 168 hours. Coagulation Profile: No results for input(s): "INR", "PROTIME" in the last 168 hours. Cardiac Enzymes: No results for input(s): "CKTOTAL", "CKMB", "CKMBINDEX", "TROPONINI" in the last 168 hours. BNP (last 3 results) No results for input(s): "PROBNP" in the last 8760 hours. HbA1C: No results for input(s): "HGBA1C" in the last 72 hours. CBG: No results for input(s): "GLUCAP" in the last 168 hours. Lipid Profile: No results for input(s): "CHOL", "HDL", "LDLCALC", "TRIG", "CHOLHDL", "LDLDIRECT" in the last 72 hours. Thyroid Function Tests: No results for input(s): "TSH", "T4TOTAL", "FREET4", "T3FREE", "THYROIDAB" in the last 72 hours. Anemia Panel: No results for input(s): "VITAMINB12", "FOLATE", "FERRITIN", "TIBC", "IRON", "RETICCTPCT" in the last 72 hours. Urine analysis:    Component Value Date/Time   COLORURINE YELLOW 07/15/2021 1321   APPEARANCEUR HAZY (A) 07/15/2021 1321   LABSPEC 1.018 07/15/2021 1321   PHURINE 7.0 07/15/2021 1321   GLUCOSEU NEGATIVE 07/15/2021 1321   HGBUR NEGATIVE 07/15/2021 1321   BILIRUBINUR NEGATIVE 07/15/2021 1321   KETONESUR NEGATIVE 07/15/2021 1321   PROTEINUR NEGATIVE 07/15/2021 1321   UROBILINOGEN 0.2 10/05/2014 1442   NITRITE NEGATIVE 07/15/2021 1321   LEUKOCYTESUR NEGATIVE 07/15/2021 1321    Radiological Exams on Admission: I have personally reviewed images DG Chest Port 1 View  Result Date: 10/11/2021 CLINICAL DATA:  Provided history: Allergic reaction. EXAM: PORTABLE CHEST 1 VIEW COMPARISON:  Chest CT 12/27/2018. Prior chest radiographs 12/27/2018 and earlier. FINDINGS: Prior median sternotomy. Heart size within normal limits. Chronic linear focus of scarring within  the left upper lobe. No appreciable airspace consolidation or pulmonary edema. No evidence of pleural effusion or pneumothorax. No acute bony abnormality identified. IMPRESSION: No evidence of acute cardiopulmonary abnormality. Electronically Signed   By: Kellie Simmering D.O.   On: 10/11/2021 16:40    EKG: I have personally reviewed EKG: NSR  Assessment/Plan Active Problems:   Mesenteric ischemia (HCC)   Acute anaphylaxis   GERD (gastroesophageal reflux disease)   Chronic systolic congestive heart failure (HCC)   Essential hypertension    Assessment and Plan: Acute anaphylaxis Patient has had many vascular studies and procedures without h/o any dye reaction. Presentation was c/w with anaphylactic reaction. She has responded to emergent treatment with IV steroids, benadryl and Pepcid  Plan obs med-surg admit   Oral prednisone starting with 60 mg tonight, 40 mg in AM and slow taper  For future vascular studies - LMW contrast with premedication  Mesenteric ischemia ([place]) On going problem. Patient still mildly symptomatic with eating. Angiogram from day - results pending  Plan Per GI and Vascular Surgery as outpatient  Chronic systolic congestive heart failure ([place]) Stable with no signs of decompensation.  Plan Continue home meds.   GERD (  gastroesophageal reflux disease) Stable with no acute complaints  Plan Continue home medications  Essential hypertension BP was soft on presentation to ED but at exam was 141/61  Plan Continue home medications       DVT prophylaxis: Lovenox Code Status: Full Code Family Communication: Spoke with Mr. Falter.  Disposition Plan: Home 24 hrs  Consults called: none  Admission status: Observation, Med-Surg   Michelle Hare, MD [facility] 10/11/2021, 9:45 PM

## 2021-10-11 NOTE — Assessment & Plan Note (Signed)
Stable with no signs of decompensation.  Plan Continue home meds.

## 2021-10-11 NOTE — ED Triage Notes (Signed)
Pt wheeled over from radiology department with reports of allergic reaction after being given contrast dye. Pt has upper lip swelling and bilateral upper extremity redness.

## 2021-10-11 NOTE — Subjective & Objective (Signed)
Michelle Patton, a 66 y/o  with h/o aortic patch at 10, redo heart surgery, HLD, HTN, PAD with h/o iliac artery stent 2009. For approximately 18 months she has been followed for mesenteric ischemia. She had DES SMA 10/29/20 which by arterigram 06/09/21 was occlued. March 21st she underwent recanalization o the SMA stent along with angioplasty. Her symptoms improved but remained a problem for her with eating. She was for followed angiogram today to reassess SMA. At the end of the procedure she c/o itching. She was noted to be flushed and began having respiratory difficulty. She was immediately transferred to the [facility].

## 2021-10-11 NOTE — Assessment & Plan Note (Signed)
Patient has had many vascular studies and procedures without h/o any dye reaction. Presentation was c/w with anaphylactic reaction. She has responded to emergent treatment with IV steroids, benadryl and Pepcid  Plan obs med-surg admit   Oral prednisone starting with 60 mg tonight, 40 mg in AM and slow taper  For future vascular studies - LMW contrast with premedication

## 2021-10-11 NOTE — ED Provider Notes (Addendum)
[facility] EMERGENCY DEPARTMENT Provider Note   CSN: [id number] Arrival date & time: 10/11/21  1611     History  Chief Complaint  Patient presents with   Allergic Reaction    Michelle Patton is a 66 y.o. female.  Patient brought over emergently from [place].  Patient was having CT angio of abdomen to rule out ischemic bowel.  Patient with allergic reaction to the dye.  Scan was completed.  Patient even had IV removed.  Then she started to get a flushing feeling feeling hot feeling like she was having difficulty breathing pressure on her chest.  And got some erythema.  Has not had trouble with dye from the scan before.  Patient brought here patient immediately got IV Benadryl IV Pepcid and IV Solu-Medrol.  Patient's blood pressures were softer with systolics around 91.  Oxygen sats were soft with oxygen sat around 91 on 2 L of oxygen so bumped up to 3.  Patient's lungs were clear no wheezing.  But upper lip was swollen.  And patient felt like her tongue was swollen.  Past medical history significant for hypertension hyperlipidemia history of deep vein thrombosis peripheral artery disease aortic stenosis severe with aortic regurg and repair.  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  Surgical history significant for iliac Artery stent in 2009 the cardiac valve replacement and 68 gallbladder removal rotator cuff repair abdominal and the aortic valve replacement in 2016.  Patient followed by GI medicine [facility].  Patient known to have chronic mesenteric ischemia.  This was the reason for the CT scan.  Patient's had stents placed before.  Patient underwent superior mesenteric artery stent placement in July 2022 and repeat imaging in February of this year showed stent to be occluded.  She presented to IR in  March 23 for an elective mesenteric and lower extremity angiogram with SMA stent Reeve cannulization and balloon angioplasty of the proximal SMA.  Her diarrhea has resolved since the second stent.  But has had pain at times and vascular is aware.  Patient cannot drink tea and certain other things.  Painful abdominal symptoms when eating.       Home Medications Prior to Admission medications   Medication Sig Start Date End Date Taking? Authorizing Provider  aspirin EC 81 MG tablet Take 1 tablet (81 mg total) by mouth daily. Swallow whole. 12/16/19  Yes Branch, Alphonse Guild, MD  Cholecalciferol (DIALYVITE VITAMIN D 5000) 125 MCG (5000 UT) capsule Take 5,000 Units by mouth daily.   Yes [provider]  clopidogrel (PLAVIX) 75 MG tablet Take 1 tablet (75 mg total) by mouth daily. 07/13/21  Yes Covington, Roselyn Reef R, NP  diclofenac (VOLTAREN) 75 MG EC tablet TAKE 1 TABLET TWICE A DAY Patient taking differently: Take 75 mg by mouth 2 (two) times daily. 03/03/21  Yes Carole Civil, MD  estradiol (ESTRACE) 2 MG tablet Take 1 tablet (2 mg total) by mouth daily. 01/19/21  Yes Wendie Agreste, MD  Flaxseed, Linseed, (FLAX SEED OIL PO) Take 1 capsule by mouth daily.   Yes [provider]  liothyronine (CYTOMEL) 25 MCG tablet Take 25 mcg by mouth daily. 10/06/21  Yes [provider]  losartan (COZAAR) 25 MG tablet TAKE 1 TABLET BY MOUTH DAILY Patient taking differently: Take 25 mg by mouth daily. 06/27/21  Yes Carlota Raspberry,  Ranell Patrick, MD  metoprolol tartrate (LOPRESSOR) 25 MG tablet TAKE 1 TABLET TWICE A DAY Patient taking differently: Take 25 mg by mouth 2 (two) times daily. 03/29/21  Yes BranchAlphonse Guild, MD  Multiple Minerals (CALCIUM-MAGNESIUM-ZINC) TABS Take 1 tablet by mouth daily.   Yes [provider]  Multiple Vitamin (MULTIVITAMIN WITH MINERALS) TABS tablet Take 1 tablet by mouth daily.   Yes [provider]  NP THYROID 120 MG tablet Take 1 tablet (120 mg  total) by mouth in the morning and at bedtime. Patient taking differently: Take 120 mg by mouth daily. 01/19/21  Yes Wendie Agreste, MD  progesterone (PROMETRIUM) 200 MG capsule Take 2 capsules (400 mg total) by mouth daily. 01/19/21 10/11/21 Yes Wendie Agreste, MD  RABEprazole (ACIPHEX) 20 MG tablet TAKE ONE TABLET BY MOUTH TWICE A DAY BEFORE A MEAL Patient taking differently: Take 20 mg by mouth in the morning and at bedtime. 02/09/21  Yes Annitta Needs, NP  rosuvastatin (CRESTOR) 40 MG tablet Take 1 tablet (40 mg total) by mouth daily. 03/25/21 10/11/21 Yes BranchAlphonse Guild, MD      Allergies    Iodinated contrast media, Penicillins, and Sulfa antibiotics    Review of Systems   Review of Systems  Constitutional:  Negative for chills and fever.  HENT:  Positive for facial swelling. Negative for ear pain and sore throat.   Eyes:  Negative for pain and visual disturbance.  Respiratory:  Positive for shortness of breath. Negative for cough.   Cardiovascular:  Positive for chest pain. Negative for palpitations.  Gastrointestinal:  Negative for abdominal pain and vomiting.  Genitourinary:  Negative for dysuria and hematuria.  Musculoskeletal:  Negative for arthralgias and back pain.  Skin:  Positive for rash. Negative for color change.  Neurological:  Positive for light-headedness. Negative for seizures and syncope.  All other systems reviewed and are negative.   Physical Exam Updated Vital Signs BP (!) 114/55   Pulse 70   Resp 15   SpO2 100%  Physical Exam Vitals and nursing note reviewed.  Constitutional:      General: She is not in acute distress.    Appearance: Normal appearance. She is well-developed.  HENT:     Head: Normocephalic.     Comments: Swelling to the upper lip.  Patient feels like tongue is swollen may be some slight swelling to the tongue.    Mouth/Throat:     Mouth: Mucous membranes are moist.     Pharynx: No oropharyngeal exudate or posterior  oropharyngeal erythema.     Comments: Uvula midline Eyes:     Extraocular Movements: Extraocular movements intact.     Conjunctiva/sclera: Conjunctivae normal.     Pupils: Pupils are equal, round, and reactive to light.  Cardiovascular:     Rate and Rhythm: Normal rate and regular rhythm.     Heart sounds: No murmur heard. Pulmonary:     Effort: Respiratory distress present.     Breath sounds: Normal breath sounds. No stridor. No wheezing, rhonchi or rales.     Comments: No wheezing no rhonchi no stridor Chest:     Chest wall: No tenderness.  Abdominal:     General: There is no distension.     Palpations: Abdomen is soft.     Tenderness: There is no abdominal tenderness.  Musculoskeletal:        General: No swelling.     Cervical back: Normal range of motion and neck supple. No rigidity.  Skin:    General: Skin is warm and dry.     Capillary Refill: Capillary refill takes less than 2 seconds.     Findings: Erythema and rash present.     Comments: Some erythema to both upper extremities a little bit to the face.  Some faint hives to the abdomen.  Neurological:     General: No focal deficit present.     Mental Status: She is alert and oriented to person, place, and time.     Cranial Nerves: No cranial nerve deficit.     Sensory: No sensory deficit.     Motor: No weakness.  Psychiatric:        Mood and Affect: Mood normal.     ED Results / Procedures / Treatments   Labs (all labs ordered are listed, but only abnormal results are displayed) Labs Reviewed  CBC WITH DIFFERENTIAL/PLATELET  COMPREHENSIVE METABOLIC PANEL  BRAIN NATRIURETIC PEPTIDE  TROPONIN I (HIGH SENSITIVITY)  TROPONIN I (HIGH SENSITIVITY)    EKG EKG Interpretation  Date/Time:  Tuesday October 11 2021 16:36:39 EDT Ventricular Rate:  70 PR Interval:  146 QRS Duration: 103 QT Interval:  441 QTC Calculation: 476 R Axis:   76 Text Interpretation: Sinus rhythm Confirmed by Fredia Sorrow [phone]) on  10/11/2021 4:42:21 PM  Radiology DG Chest Port 1 View  Result Date: 10/11/2021 CLINICAL DATA:  Provided history: Allergic reaction. EXAM: PORTABLE CHEST 1 VIEW COMPARISON:  Chest CT 12/27/2018. Prior chest radiographs 12/27/2018 and earlier. FINDINGS: Prior median sternotomy. Heart size within normal limits. Chronic linear focus of scarring within the left upper lobe. No appreciable airspace consolidation or pulmonary edema. No evidence of pleural effusion or pneumothorax. No acute bony abnormality identified. IMPRESSION: No evidence of acute cardiopulmonary abnormality. Electronically Signed   By: Kellie Simmering D.O.   On: 10/11/2021 16:40    Procedures Procedures    Medications Ordered in ED Medications  0.9 %  sodium chloride infusion ( Intravenous New Bag/Given 10/11/21 1751)  diphenhydrAMINE (BENADRYL) injection 50 mg (50 mg Intravenous Given 10/11/21 1620)  methylPREDNISolone sodium succinate (SOLU-MEDROL) 125 mg/2 mL injection 125 mg (125 mg Intravenous Given 10/11/21 1620)  famotidine (PEPCID) IVPB 20 mg premix (0 mg Intravenous Stopped 10/11/21 1651)  EPINEPHrine (EPI-PEN) injection 0.3 mg (0.3 mg Intramuscular Given 10/11/21 1638)  sodium chloride 0.9 % bolus 1,000 mL (0 mLs Intravenous Stopped 10/11/21 1737)    ED Course/ Medical Decision Making/ A&P                           Medical Decision Making Amount and/or Complexity of Data Reviewed Labs: ordered. Radiology: ordered.  Risk Prescription drug management. Decision regarding hospitalization.   CRITICAL CARE Performed by: Fredia Sorrow Total critical care time: 60 minutes Critical care time was exclusive of separately billable procedures and treating other patients. Critical care was necessary to treat or prevent imminent or life-threatening deterioration. Critical care was time spent personally by me on the following activities: development of treatment plan with patient and/or surrogate as well as nursing, discussions  with consultants, evaluation of patient's response to treatment, examination of patient, obtaining history from patient or surrogate, ordering and performing treatments and interventions, ordering and review of laboratory studies, ordering and review of radiographic studies, pulse oximetry and re-evaluation of patient's condition.  Patient's blood pressures were soft of systolics around 91.  Patient's oxygen levels in the low 90s on 2 L of oxygen bumped up to 3.  Patient received Solu-Medrol Benadryl and Pepcid IV.  And blood pressure still were soft.  So patient received EpiPen.  And patient receiving 1 L of fluid.  Blood pressure is now improving.  Patient overall showing signs of improvement.  Erythema has resolved.  Still some slight swelling to the upper lip but improved.  Patient still feels like tongue is swollen but looks better.  No wheezing.  CBC no leukocytosis hemoglobin 12 complete metabolic panel pending troponins are pending BNP is pending chest x-ray no evidence of acute abnormality.  Once labs are known I feel patient is going to require admission for observation.  Patient's CT angio is not been read yet.  There is no report.  Final Clinical Impression(s) / ED Diagnoses Final diagnoses:  Anaphylaxis, initial encounter    Rx / DC Orders ED Discharge Orders     None         Fredia Sorrow, MD 10/11/21 Grier Mitts    Fredia Sorrow, MD 10/11/21 1805    Fredia Sorrow, MD 10/11/21 1807

## 2021-10-11 NOTE — Assessment & Plan Note (Signed)
On going problem. Patient still mildly symptomatic with eating. Angiogram from day - results pending  Plan Per GI and Vascular Surgery as outpatient

## 2021-10-11 NOTE — ED Notes (Signed)
Pt to bathroom via wheelchair. Then wanted to walk back to room

## 2021-10-11 NOTE — Assessment & Plan Note (Signed)
Blood pressure has improved.  Continue with losartan and metoprolol.

## 2021-10-12 DIAGNOSIS — T782XXA Anaphylactic shock, unspecified, initial encounter: Secondary | ICD-10-CM | POA: Diagnosis not present

## 2021-10-12 DIAGNOSIS — T782XXD Anaphylactic shock, unspecified, subsequent encounter: Secondary | ICD-10-CM | POA: Diagnosis not present

## 2021-10-12 DIAGNOSIS — K55059 Acute (reversible) ischemia of intestine, part and extent unspecified: Secondary | ICD-10-CM | POA: Diagnosis not present

## 2021-10-12 DIAGNOSIS — I10 Essential (primary) hypertension: Secondary | ICD-10-CM | POA: Diagnosis not present

## 2021-10-12 DIAGNOSIS — I5022 Chronic systolic (congestive) heart failure: Secondary | ICD-10-CM | POA: Diagnosis not present

## 2021-10-12 DIAGNOSIS — K219 Gastro-esophageal reflux disease without esophagitis: Secondary | ICD-10-CM | POA: Diagnosis not present

## 2021-10-12 DIAGNOSIS — I110 Hypertensive heart disease with heart failure: Secondary | ICD-10-CM | POA: Diagnosis not present

## 2021-10-12 MED ORDER — PANTOPRAZOLE SODIUM 40 MG PO TBEC: 40.0000 mg | DELAYED_RELEASE_TABLET | Freq: Every day | ORAL | Status: DC

## 2021-10-12 MED ORDER — CLOPIDOGREL BISULFATE 75 MG PO TABS
75.0000 mg | ORAL_TABLET | Freq: Every day | ORAL | Status: DC
  Administered 2021-10-12: 75 mg via ORAL
  Filled 2021-10-12: qty 1

## 2021-10-12 MED ORDER — ALPRAZOLAM 0.5 MG PO TABS
0.5000 mg | ORAL_TABLET | Freq: Three times a day (TID) | ORAL | Status: DC | PRN
Start: 2021-10-12 — End: 2021-10-13
  Administered 2021-10-12: 0.5 mg via ORAL
  Filled 2021-10-12: qty 1

## 2021-10-12 MED ORDER — PROGESTERONE 200 MG PO CAPS
400.0000 mg | ORAL_CAPSULE | Freq: Every day | ORAL | Status: DC
  Administered 2021-10-12: 400 mg via ORAL
  Filled 2021-10-12 (×2): qty 2

## 2021-10-12 MED ORDER — ROSUVASTATIN CALCIUM 20 MG PO TABS
40.0000 mg | ORAL_TABLET | Freq: Every day | ORAL | Status: DC
  Administered 2021-10-12: 40 mg via ORAL
  Filled 2021-10-12: qty 2

## 2021-10-12 NOTE — TOC Progression Note (Signed)
  Transition of Care [facility]) Screening Note   Patient Details  Name: Mistina Coatney Date of Birth: [birth year]   Transition of Care [facility]) CM/SW Contact:    Shade Flood, LCSW Phone Number: 10/12/2021, 8:50 AM    Transition of Care Department [facility]) has reviewed patient and no TOC needs have been identified at this time. We will continue to monitor patient advancement through interdisciplinary progression rounds. If new patient transition needs arise, please place a TOC consult.

## 2021-10-12 NOTE — Progress Notes (Signed)
Patient stated her stomach was cramping and hurting and wanted chocolate ice cream.

## 2021-10-12 NOTE — Hospital Course (Signed)
Michelle Patton was admitted to the hospital with the working diagnosis of acute anaphylaxis reaction.   66 yo female with the past medical history of hypertension, dyslipidemia, and peripheral vascular disease, hx of aortic patch and redo heart surgery who presented from radiology due to anaphylactic reaction. She had SMA stent in the past and was getting angiogram to assess stents. After completing scans she had pruritus, flushing and respiratory distress that prompted her transfer to the ED. On her initial physical examination her blood pressure was 83/69, then 134/56, HR 74, RR 16 and 02 saturation 96%, lungs with no wheezing, heart with S1 and S2 present and rhythmic, abdomen soft and no lower extremity edema. Skin with no rash.   Na 135, K 4,3 Cl 108, bicarbonate 22, glucose 133, bun 15 cr 0,92  Wbc 6.4 htb 12.0 plt 219  Chest radiograph with no infiltrates, no cardiomegaly.   EKG 70 bpm, normal axis, normal intervals, sinus rhythm, with no significant ST segment or T wave changes.   Patient was treated with systemic steroids, H1 and H2 blockers with good toleration.

## 2021-10-12 NOTE — Discharge Summary (Addendum)
Physician Discharge Summary   Patient: Michelle Patton MRN: [id number] DOB: 10/07/1955  Admit date:     10/11/2021  Discharge date: 10/13/21  Discharge Physician: Jimmy Picket Helmuth Recupero   PCP: Wendie Agreste, MD   Recommendations at discharge:    Patient will follow up as outpatient in 7 to 10 days. Follow up with Ct angiography report as outpatient   Discharge Diagnoses: Principal Problem:   Anaphylaxis Active Problems:   Mesenteric ischemia (HCC)   Acute anaphylaxis   GERD (gastroesophageal reflux disease)   Chronic systolic congestive heart failure (HCC)   Essential hypertension  Resolved Problems:   Hyperlipidemia  Hospital Course: Mrs. Wamser was admitted to the hospital with the working diagnosis of acute anaphylaxis reaction.   66 yo female with the past medical history of hypertension, dyslipidemia, and peripheral vascular disease, hx of aortic patch and redo heart surgery who presented from radiology due to anaphylactic reaction. She had SMA stent in the past and was getting angiogram to assess stents. After completing scans she had pruritus, flushing and respiratory distress that prompted her transfer to the ED. On her initial physical examination her blood pressure was 83/69, then 134/56, HR 74, RR 16 and 02 saturation 96%, lungs with no wheezing, heart with S1 and S2 present and rhythmic, abdomen soft and no lower extremity edema. Skin with no rash.   Na 135, K 4,3 Cl 108, bicarbonate 22, glucose 133, bun 15 cr 0,92  Wbc 6.4 htb 12.0 plt 219  Chest radiograph with no infiltrates, no cardiomegaly.   EKG 70 bpm, normal axis, normal intervals, sinus rhythm, with no significant ST segment or T wave changes.   Patient was treated with systemic steroids, H1 and H2 blockers with good toleration.   Assessment and Plan: * Acute anaphylaxis Patient had improvement in her symptoms after H1 and H2 blockers.  Received 125 mg IV methylprednisolone, follwed with prednisone  60 mg and on 06/21 40 mg with good toleration.   Her rash and dyspnea have improved.  Patient will follow up as outpatient.   Mesenteric ischemia ([place]) Follow up with angiogram report as outpatient, continue to follow up with vascular surgery.   Chronic systolic congestive heart failure (HCC) Echocardiogram from 2019 with preserved LV systolic function EF 65 to 70%. Aortic valve replacement tissue valve. RV systolic function was preserved.   No clinical signs of heart failure exacerbation.  Systolic blood pressure today 140 to 150 mmHg.  Continue with metoprolol.     Essential hypertension Blood pressure has improved.  Continue with losartan and metoprolol.   GERD (gastroesophageal reflux disease) Stable with no acute complaints  Plan Continue home medications         Consultants: none  Procedures performed: none   Disposition: Home Diet recommendation:  Cardiac diet DISCHARGE MEDICATION: Allergies as of 10/13/2021       Reactions   Iodinated Contrast Media Anaphylaxis, Shortness Of Breath, Itching, Swelling, Other (See Comments)   Patient was given Omni 350 and suffered from itching, chest pain and shortness of breath. Patient was taken to ED after onset of reaction. 10/11/2021  MD zackowski noted pt had tongue and throat swelling, had to give pt epinephrine    Penicillins Hives   Has patient had a PCN reaction causing immediate rash, facial/tongue/throat swelling, SOB or lightheadedness with hypotension: Yes Has patient had a PCN reaction causing severe rash involving mucus membranes or skin necrosis: No Has patient had a PCN reaction that required hospitalization No Has  patient had a PCN reaction occurring within the last 10 years: No If all of the above answers are "NO", then may proceed with Cephalosporin use. Have taken Keflex before without any adverse reaction.    Sulfa Antibiotics Nausea And Vomiting        Medication List     TAKE these medications     aspirin EC 81 MG tablet Take 1 tablet (81 mg total) by mouth daily. Swallow whole.   Calcium-Magnesium-Zinc Tabs Take 1 tablet by mouth daily.   clopidogrel 75 MG tablet Commonly known as: Plavix Take 1 tablet (75 mg total) by mouth daily.   Dialyvite Vitamin D 5000 125 MCG (5000 UT) capsule Generic drug: Cholecalciferol Take 5,000 Units by mouth daily.   diclofenac 75 MG EC tablet Commonly known as: VOLTAREN TAKE 1 TABLET TWICE A DAY   estradiol 2 MG tablet Commonly known as: ESTRACE Take 1 tablet (2 mg total) by mouth daily.   FLAX SEED OIL PO Take 1 capsule by mouth daily.   liothyronine 25 MCG tablet Commonly known as: CYTOMEL Take 25 mcg by mouth daily.   losartan 25 MG tablet Commonly known as: COZAAR TAKE 1 TABLET BY MOUTH DAILY   metoprolol tartrate 25 MG tablet Commonly known as: LOPRESSOR TAKE 1 TABLET TWICE A DAY   multivitamin with minerals Tabs tablet Take 1 tablet by mouth daily.   NP Thyroid 120 MG tablet Generic drug: thyroid Take 1 tablet (120 mg total) by mouth in the morning and at bedtime. What changed: when to take this   progesterone 200 MG capsule Commonly known as: PROMETRIUM Take 2 capsules (400 mg total) by mouth daily.   RABEprazole 20 MG tablet Commonly known as: ACIPHEX TAKE ONE TABLET BY MOUTH TWICE A DAY BEFORE A MEAL What changed: See the new instructions.   rosuvastatin 40 MG tablet Commonly known as: CRESTOR Take 1 tablet (40 mg total) by mouth daily.        Discharge Exam: Filed Weights   10/11/21 2238  Weight: 102.6 kg   BP (!) 148/83 (BP Location: Left Arm)   Pulse (!) 101   Temp 97.9 F (36.6 C) (Oral)   Resp 18   Ht '5\' 6"'$  (1.676 m)   Wt 102.6 kg   SpO2 98%   BMI 36.51 kg/m   Patient is feeling better, no rash or wheezing  Neurology awake and alert EMT with no pallor Cardiovascular with S1 and S2 present and rhythmic Respiratory with no rales or wheezing Abdomen not distended No lower  extremity edema No rashes   Condition at discharge: good  The results of significant diagnostics from this hospitalization (including imaging, microbiology, ancillary and laboratory) are listed below for reference.   Imaging Studies: DG Chest Port 1 View  Result Date: 10/11/2021 CLINICAL DATA:  Provided history: Allergic reaction. EXAM: PORTABLE CHEST 1 VIEW COMPARISON:  Chest CT 12/27/2018. Prior chest radiographs 12/27/2018 and earlier. FINDINGS: Prior median sternotomy. Heart size within normal limits. Chronic linear focus of scarring within the left upper lobe. No appreciable airspace consolidation or pulmonary edema. No evidence of pleural effusion or pneumothorax. No acute bony abnormality identified. IMPRESSION: No evidence of acute cardiopulmonary abnormality. Electronically Signed   By: Kellie Simmering D.O.   On: 10/11/2021 16:40    Microbiology: Results for orders placed or performed during the hospital encounter of 09/26/20  Urine Culture     Status: None   Collection Time: 09/26/20  4:20 PM   Specimen: Urine, Clean  Catch  Result Value Ref Range Status   Specimen Description   Final    URINE, CLEAN CATCH Performed at [facility], [address], [place], [place] [address]    Special Requests   Final    NONE Performed at [facility], [address] [place], [place] [address]    Culture   Final    NO GROWTH Performed at [facility], [place] [address] [place], [place] [address]    Report Status 09/28/2020 FINAL  Final    Labs: CBC: Recent Labs  Lab 10/11/21 1701  WBC 6.4  NEUTROABS 3.1  HGB 12.0  HCT 36.3  MCV 88.3  PLT 401   Basic Metabolic Panel: Recent Labs  Lab 10/11/21 1553 10/11/21 1701  NA  --  135  K  --  4.3  CL  --  108  CO2  --  22  GLUCOSE  --  133*  BUN  --  15  CREATININE 0.90 0.92  CALCIUM  --  8.1*   Liver Function Tests: Recent Labs  Lab 10/11/21 1701  AST 20  ALT 16  ALKPHOS 43  BILITOT 0.2*  PROT 5.6*  ALBUMIN 3.0*    CBG: No results for input(s): "GLUCAP" in the last 168 hours.  Discharge time spent: greater than 30 minutes.  Signed: Tawni Millers, MD [facility] 10/12/2021

## 2021-10-13 ENCOUNTER — Encounter: Payer: Self-pay | Admitting: Family Medicine

## 2021-10-13 DIAGNOSIS — I5022 Chronic systolic (congestive) heart failure: Secondary | ICD-10-CM | POA: Diagnosis not present

## 2021-10-13 DIAGNOSIS — T782XXD Anaphylactic shock, unspecified, subsequent encounter: Secondary | ICD-10-CM | POA: Diagnosis not present

## 2021-10-13 DIAGNOSIS — K219 Gastro-esophageal reflux disease without esophagitis: Secondary | ICD-10-CM | POA: Diagnosis not present

## 2021-10-13 DIAGNOSIS — T782XXA Anaphylactic shock, unspecified, initial encounter: Secondary | ICD-10-CM

## 2021-10-13 DIAGNOSIS — I10 Essential (primary) hypertension: Secondary | ICD-10-CM | POA: Diagnosis not present

## 2021-10-13 MED ORDER — PREDNISONE 20 MG PO TABS: ORAL_TABLET | ORAL | 0 refills | Status: DC

## 2021-10-13 NOTE — Telephone Encounter (Signed)
I think prednisone taper is reasonable, she had been on 60 mg, then 40 mg.  Lets start 20 mg today, then tomorrow same dose, change to 10 mg on Saturday and Sunday and should be able to stop on Monday.  Can follow-up with me on Monday as planned.  Let me know if she has questions with this plan.

## 2021-10-13 NOTE — Progress Notes (Signed)
Nsg Discharge Note  Admit Date:  10/11/2021 Discharge date: 10/13/2021   Angele Wiemann to be D/C'd Home per MD order.  AVS completed.  Copy for chart, and copy for patient signed, and dated. Patient/caregiver able to verbalize understanding.  Discharge Medication: Allergies as of 10/13/2021       Reactions   Iodinated Contrast Media Anaphylaxis, Shortness Of Breath, Itching, Swelling, Other (See Comments)   Patient was given Omni 350 and suffered from itching, chest pain and shortness of breath. Patient was taken to ED after onset of reaction. 10/11/2021  MD zackowski noted pt had tongue and throat swelling, had to give pt epinephrine    Penicillins Hives   Has patient had a PCN reaction causing immediate rash, facial/tongue/throat swelling, SOB or lightheadedness with hypotension: Yes Has patient had a PCN reaction causing severe rash involving mucus membranes or skin necrosis: No Has patient had a PCN reaction that required hospitalization No Has patient had a PCN reaction occurring within the last 10 years: No If all of the above answers are "NO", then may proceed with Cephalosporin use. Have taken Keflex before without any adverse reaction.    Sulfa Antibiotics Nausea And Vomiting        Medication List     TAKE these medications    aspirin EC 81 MG tablet Take 1 tablet (81 mg total) by mouth daily. Swallow whole.   Calcium-Magnesium-Zinc Tabs Take 1 tablet by mouth daily.   clopidogrel 75 MG tablet Commonly known as: Plavix Take 1 tablet (75 mg total) by mouth daily.   Dialyvite Vitamin D 5000 125 MCG (5000 UT) capsule Generic drug: Cholecalciferol Take 5,000 Units by mouth daily.   diclofenac 75 MG EC tablet Commonly known as: VOLTAREN TAKE 1 TABLET TWICE A DAY   estradiol 2 MG tablet Commonly known as: ESTRACE Take 1 tablet (2 mg total) by mouth daily.   FLAX SEED OIL PO Take 1 capsule by mouth daily.   liothyronine 25 MCG tablet Commonly known as:  CYTOMEL Take 25 mcg by mouth daily.   losartan 25 MG tablet Commonly known as: COZAAR TAKE 1 TABLET BY MOUTH DAILY   metoprolol tartrate 25 MG tablet Commonly known as: LOPRESSOR TAKE 1 TABLET TWICE A DAY   multivitamin with minerals Tabs tablet Take 1 tablet by mouth daily.   NP Thyroid 120 MG tablet Generic drug: thyroid Take 1 tablet (120 mg total) by mouth in the morning and at bedtime. What changed: when to take this   progesterone 200 MG capsule Commonly known as: PROMETRIUM Take 2 capsules (400 mg total) by mouth daily.   RABEprazole 20 MG tablet Commonly known as: ACIPHEX TAKE ONE TABLET BY MOUTH TWICE A DAY BEFORE A MEAL What changed: See the new instructions.   rosuvastatin 40 MG tablet Commonly known as: CRESTOR Take 1 tablet (40 mg total) by mouth daily.        Discharge Assessment: Vitals:   10/12/21 2111 10/13/21 0601  BP: (!) 159/73 (!) 150/80  Pulse: 99 77  Resp: 17 18  Temp: 97.8 F (36.6 C) 97.9 F (36.6 C)  SpO2: 97% 98%   Skin clean, dry and intact without evidence of skin break down, no evidence of skin tears noted. IV catheter discontinued intact. Site without signs and symptoms of complications - no redness or edema noted at insertion site, patient denies c/o pain - only slight tenderness at site.  Dressing with slight pressure applied.  D/c Instructions-Education: Discharge instructions given  to patient/family with verbalized understanding. D/c education completed with patient/family including follow up instructions, medication list, d/c activities limitations if indicated, with other d/c instructions as indicated by MD - patient able to verbalize understanding, all questions fully answered. Patient instructed to return to ED, call 911, or call MD for any changes in condition.  Patient escorted via [place], and D/C home via private auto.  Loa Socks, RN 10/13/2021 8:29 AM

## 2021-10-13 NOTE — Progress Notes (Signed)
Patient's discharge was delayed yesterday due to patient having chest pressure. Her symptoms have improved over night. This am with no chest pain or dyspnea, no rash.  BP (!) 150/80 (BP Location: Right Arm)   Pulse 77   Temp 97.9 F (36.6 C) (Oral)   Resp 18   Ht '5\' 6"'$  (1.676 m)   Wt 102.6 kg   SpO2 98%   BMI 36.51 kg/m   Lungs with no wheezing Heart with S1 and S2 present and rhythmic Abdomen not distended No edema No rashes  Acute anaphylactic reaction has resolved.  Plan to discharge home today. No further steroids needed.  Noted CT angiogram with patent stents, follow up as outpatient.

## 2021-10-13 NOTE — Telephone Encounter (Signed)
Pt asking about prednisone can you review and advise until she can get appt to f/u

## 2021-10-17 ENCOUNTER — Encounter: Payer: Self-pay | Admitting: Family Medicine

## 2021-10-17 ENCOUNTER — Ambulatory Visit: Payer: BC Managed Care – PPO | Admitting: Family Medicine

## 2021-10-17 VITALS — BP 162/80 | HR 97 | Temp 98.1°F | Resp 16 | Ht 66.0 in | Wt 225.8 lb

## 2021-10-17 DIAGNOSIS — T782XXD Anaphylactic shock, unspecified, subsequent encounter: Secondary | ICD-10-CM

## 2021-10-17 DIAGNOSIS — I10 Essential (primary) hypertension: Secondary | ICD-10-CM

## 2021-10-17 DIAGNOSIS — R0789 Other chest pain: Secondary | ICD-10-CM

## 2021-10-18 ENCOUNTER — Encounter: Payer: Self-pay | Admitting: *Deleted

## 2021-10-18 ENCOUNTER — Ambulatory Visit
Admission: RE | Admit: 2021-10-18 | Discharge: 2021-10-18 | Disposition: A | Discharge status: Home / Self Care | Payer: BC Managed Care – PPO | Source: Ambulatory Visit | Attending: Interventional Radiology | Admitting: Interventional Radiology

## 2021-10-18 DIAGNOSIS — K559 Vascular disorder of intestine, unspecified: Secondary | ICD-10-CM

## 2021-10-18 HISTORY — PX: IR RADIOLOGIST EVAL & MGMT: IMG5224

## 2021-11-03 ENCOUNTER — Other Ambulatory Visit: Payer: Self-pay | Admitting: Interventional Radiology

## 2021-11-03 DIAGNOSIS — K559 Vascular disorder of intestine, unspecified: Secondary | ICD-10-CM

## 2021-11-25 ENCOUNTER — Ambulatory Visit: Payer: BC Managed Care – PPO | Admitting: Allergy & Immunology

## 2021-11-25 ENCOUNTER — Encounter: Payer: Self-pay | Admitting: Allergy & Immunology

## 2021-11-25 ENCOUNTER — Ambulatory Visit (HOSPITAL_COMMUNITY)
Admission: RE | Admit: 2021-11-25 | Discharge: 2021-11-25 | Disposition: A | Discharge status: Home / Self Care | Payer: BC Managed Care – PPO | Source: Ambulatory Visit | Attending: Interventional Radiology | Admitting: Interventional Radiology

## 2021-11-25 VITALS — BP 128/70 | HR 68 | Temp 97.9°F | Resp 16 | Ht 64.5 in | Wt 219.1 lb

## 2021-11-25 DIAGNOSIS — L500 Allergic urticaria: Secondary | ICD-10-CM | POA: Diagnosis not present

## 2021-11-25 DIAGNOSIS — T360X5D Adverse effect of penicillins, subsequent encounter: Secondary | ICD-10-CM

## 2021-11-25 DIAGNOSIS — T508X5D Adverse effect of diagnostic agents, subsequent encounter: Secondary | ICD-10-CM | POA: Diagnosis not present

## 2021-11-25 DIAGNOSIS — W57XXXD Bitten or stung by nonvenomous insect and other nonvenomous arthropods, subsequent encounter: Secondary | ICD-10-CM

## 2021-11-25 DIAGNOSIS — K559 Vascular disorder of intestine, unspecified: Secondary | ICD-10-CM | POA: Diagnosis present

## 2021-11-25 MED ORDER — TRIAMCINOLONE ACETONIDE 0.1 % EX OINT
1.0000 | TOPICAL_OINTMENT | Freq: Two times a day (BID) | CUTANEOUS | 0 refills | Status: DC | PRN
Start: 2021-11-25 — End: 2023-08-06

## 2021-11-25 NOTE — Patient Instructions (Addendum)
1. Contrast media adverse reaction - There is a well documented and validated premedication regimen for patients who experience anaphylaxis to contrast media.  - However, I can understand why you would be concerned with having the reaction again. - If you want to try this at some point or it comes to the point where you need to get one again (since the alternative imaging options might not be as good): three doses of 50 mg oral prednisone administered 13, 7, and 1 hour prior to contrast administration, plus 50 mg oral diphenhydramine administered 1 hour prior to contrast administration - EpiPen training reviewed and anaphylaxis management plan provided.  - I am going to send a serum tryptase to make sure that you do not have a mast cell disease (I think this is unlikely).   2. Penicillin drug allergy - We are going to schedule you for a graded challenge. - This will take place in the office in a controlled environment.  - We will check vitals before each dose to make sure that you are doing well.  - It would be good to open up this class of antibiotics in case you need them in the future.   3. Multiple insect bites - We are adding on triamcinolone to use 2-3 times daily to help with itching. - Stop using once the lesions are healed. - Discussed the problems with using on the face and other sensitive areas.   4. Return in about 4 weeks (around 12/23/2021) for [place].    Please inform us of any Emergency Department visits, hospitalizations, or changes in symptoms. Call us before going to the ED for breathing or allergy symptoms since we might be able to fit you in for a sick visit. Feel free to contact us anytime with any questions, problems, or concerns.  It was a pleasure to meet you today!  Websites that have reliable patient information: 1. American Academy of Asthma, Allergy, and Immunology: www.aaaai.org 2. Food Allergy Research and Education (FARE): foodallergy.org 3.  Mothers of Asthmatics: http://www.asthmacommunitynetwork.org 4. American College of Allergy, Asthma, and Immunology: www.acaai.org   COVID-19 Vaccine Information can be found at: ShippingScam.co.uk For questions related to vaccine distribution or appointments, please email [email or web address] or call [phone].   We realize that you might be concerned about having an allergic reaction to the COVID19 vaccines. To help with that concern, WE ARE OFFERING THE COVID19 VACCINES IN OUR OFFICE! Ask the front desk for dates!     "Like" [place] on Facebook and Instagram for our latest updates!      A healthy democracy works best when [employer] participate! Make sure you are registered to vote! If you have moved or changed any of your contact information, you will need to get this updated before voting!  In some cases, you MAY be able to register to vote online: [email or web address]

## 2021-11-25 NOTE — Progress Notes (Signed)
NEW PATIENT  Date of Service/Encounter:  11/25/21  Consult requested by: Wendie Agreste, MD   Assessment:   Allergic urticaria  Contrast media adverse reaction - recommended premedication regimen, but patient refuses to ever try again  Adverse effect of penicillin - low risk and needs challenge  Multiple insect bites  Plan/Recommendations:   1. Contrast media adverse reaction - There is a well documented and validated premedication regimen for patients who experience anaphylaxis to contrast media.  - However, I can understand why you would be concerned with having the reaction again. - If you want to try this at some point or it comes to the point where you need to get one again (since the alternative imaging options might not be as good): three doses of 50 mg oral prednisone administered 13, 7, and 1 hour prior to contrast administration, plus 50 mg oral diphenhydramine administered 1 hour prior to contrast administration - EpiPen training reviewed and anaphylaxis management plan provided.  - I am going to send a serum tryptase to make sure that you do not have a mast cell disease (I think this is unlikely).   2. Penicillin drug allergy - We are going to schedule you for a graded challenge. - This will take place in the office in a controlled environment.  - We will check vitals before each dose to make sure that you are doing well.  - It would be good to open up this class of antibiotics in case you need them in the future.   3. Multiple insect bites - We are adding on triamcinolone to use 2-3 times daily to help with itching. - Stop using once the lesions are healed. - Discussed the problems with using on the face and other sensitive areas.   4. Return in about 4 weeks (around 12/23/2021) for [place].     This note in its entirety was forwarded to the Provider who requested this consultation.  Subjective:   Taiyana Kissler is a 66 y.o. female  presenting today for evaluation of  Chief Complaint  Patient presents with   Allergic Reaction    Had a CT scan in June and had an anaphylactic shock. Was sent here for an epi-pen.  -Allergy to fleas    Walaa Carel has a history of the following: Patient Active Problem List   Diagnosis Date Noted   Acute anaphylaxis 10/11/2021   Mesenteric ischemia ([place]) 07/12/2021   Chronic mesenteric ischemia ([place]) 02/22/2021   Elevated LFTs 10/05/2020   Class 2 severe obesity due to excess calories with serious comorbidity and body mass index (BMI) of 36.0 to 36.9 in adult [facility]) 08/09/2020   PAD (peripheral artery disease) ([place]) 08/09/2020   Aortic valve prosthesis present 25/08/3974   Chronic systolic congestive heart failure ([place]) 08/09/2020   OSA and COPD overlap syndrome ([place]) 08/09/2020   s/p right carpal tunnel release 03/02/20 04/06/2020   Closed displaced fracture of proximal phalanx of lesser toe of right foot 12/31/2019   Carpal tunnel syndrome of right wrist 12/05/2019   Nondisplaced fracture of fifth right metatarsal bone with routine healing 10/21/2019   Skin lesion 04/11/2019   Low back pain without sciatica 01/20/2019   Pain of upper abdomen 01/14/2019   Fatigue 12/09/2018   Breast cancer screening 12/09/2018   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 left wrist 10/23/2018   S/P right knee arthroscopy 01/10/18 01/18/2018   Chondromalacia, patella, right    Chondromalacia of medial  condyle of right femur    Personal history of colonic polyps 12/28/2016   Abdominal pain, epigastric 08/10/2016   Left sided abdominal pain 08/10/2016   Esophageal dysphagia 08/10/2016   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  Elysha Daw was referred by Wendie Agreste, MD.     Zanaiya is a 66 y.o. female presenting for an evaluation of anaphylaxis .  She was seen in the emergency room on October 11, 2021.  At that time, she experienced anaphylaxis from a CT scan.  She was having a CT scan to look at some vascular issues.  She received IV steroids, Benadryl, and Pepcid.  Because of continued symptoms, she was admitted to the hospital for observation.  At presentation, her BP was 83/69.  She was erythematous and was endorsing chest pressure and difficulty breathing.  She had perioral edema with swelling of her tongue and hands.  She was treated with Solu-Medrol, Benadryl, Pepcid, and epinephrine.  She has a significant cardiac history. She had aortic stenosis when she was a child. She had to have a cath every 6 months before the surgery. Then she had a surgery around 66 years of age. She had a Kevlar patch placed. She lived with CHF for her entire life and it worsened in 2015. She then had to have her aortic valve replaced. She has had two open heart surgeries in 1968 and 2015.   She also has a history of mesenteric ischemia. This was diagnosed last summer 2022. She had pain that started one year ago this past February. She went to see her GI in June. D.r Mikey Kirschner ordered a non-contrast CT that said that nothing was wrong. Then she had a CT with contrast and she was emergently treated with stenting. She had a second stent placed in March 2023. Now she has to have a CT scan every 3 months to check on this. The CT in June was not a pleasant experience.   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 She then spent a year trying to find a job and now she works for [employer] as a Administrator, Civil Service. She is the [employer] for [facility] [employer] [employer].   Her husband is a retired Paediatric nurse. This is who they get their insurance through.  She does have a history of a penicillin allergy. Her reaction occurred in the 1980s. She thinks that she just became "non-reactive" to it. She does not remember having hives even thought her chart says it. When she lived in [place], she was told that she could "no longer take penicillin" because she had developed a "resistance to it".   Sulfa antibiotics make her "sick as a dog". She does not currently get antibiotics at all. She did have some antibiotics when she first developed some abdominal pain. Otherwise she has not needed antibiotics much at all.   Otherwise, there is no history  of other atopic diseases, including asthma, food allergies, drug allergies, stinging insect allergies, or contact dermatitis. There is no significant infectious history. Vaccinations are up to date.    Past Medical History: Patient Active Problem List   Diagnosis Date Noted   Acute anaphylaxis 10/11/2021   Mesenteric ischemia ([facility]) 07/12/2021   Chronic mesenteric ischemia ([facility]) 02/22/2021   Elevated LFTs 10/05/2020   Class 2 severe obesity due to excess calories with serious comorbidity and body mass index (BMI) of 36.0 to 36.9 in adult [facility]) 08/09/2020   PAD (peripheral artery disease) ([place]) 08/09/2020   Aortic valve prosthesis present 18/29/9371   Chronic systolic congestive heart failure ([place]) 08/09/2020   OSA and COPD overlap syndrome ([place]) 08/09/2020   s/p right carpal tunnel release 03/02/20 04/06/2020   Closed displaced fracture of proximal phalanx of lesser toe of right foot 12/31/2019   Carpal tunnel syndrome of right wrist 12/05/2019   Nondisplaced fracture of fifth right metatarsal bone with routine healing  10/21/2019   Skin lesion 04/11/2019   Low back pain without sciatica 01/20/2019   Pain of upper abdomen 01/14/2019   Fatigue 12/09/2018   Breast cancer screening 12/09/2018   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 left wrist 10/23/2018   S/P right knee arthroscopy 01/10/18 01/18/2018   Chondromalacia, patella, right    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    Personal history of colonic polyps 12/28/2016   Abdominal pain, epigastric 08/10/2016   Left sided abdominal pain 08/10/2016   Esophageal dysphagia 08/10/2016   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   Supravalvular aortic stenosis, congenital - s/p repair during childhood    Essential hypertension    GERD (gastroesophageal reflux disease)    Aortic stenosis, severe 07/20/2014   Aortic regurgitation 07/20/2014   Leg cramps 09/26/2012   Occlusion and stenosis of carotid artery without mention of cerebral infarction 07/10/2012   Peripheral vascular disease, unspecified ([place]) 06/12/2012   Carotid artery bruit 06/12/2012   CONSTIPATION 12/28/2009   NAUSEA AND VOMITING 12/28/2009   DIARRHEA 12/28/2009    Medication List:  Allergies as of 11/25/2021       Reactions   Iodinated Contrast Media Anaphylaxis, Shortness Of Breath, Itching, Swelling, Other (See Comments)   Patient was given Omni 350 and suffered from itching, chest pain and shortness of breath. Patient was taken to ED after onset of reaction. 10/11/2021  MD zackowski noted pt had tongue and throat swelling, had to give pt epinephrine    Penicillins Hives   Has patient had a PCN reaction causing immediate rash, facial/tongue/throat swelling, SOB or lightheadedness with hypotension: Yes Has patient had a PCN reaction causing severe rash involving mucus membranes or skin necrosis: No Has patient had a PCN reaction that required hospitalization No Has patient had a PCN reaction occurring within  the last 10 years: No If all of the above answers are "NO", then may proceed with Cephalosporin use. Have taken Keflex before without any adverse reaction.    Sulfa Antibiotics Nausea And Vomiting        Medication List        Accurate as of November 25, 2021 11:06 AM. If you have any questions, ask your nurse or doctor.          aspirin EC 81 MG tablet Take 1 tablet (81 mg total) by mouth daily. Swallow whole.   Calcium-Magnesium-Zinc Tabs Take 1 tablet by mouth daily.   clopidogrel 75  MG tablet Commonly known as: Plavix Take 1 tablet (75 mg total) by mouth daily.   Dialyvite Vitamin D 5000 125 MCG (5000 UT) capsule Generic drug: Cholecalciferol Take 5,000 Units by mouth daily.   diclofenac 75 MG EC tablet Commonly known as: VOLTAREN TAKE 1 TABLET TWICE A DAY   estradiol 2 MG tablet Commonly known as: ESTRACE Take 1 tablet (2 mg total) by mouth daily.   FLAX SEED OIL PO Take 1 capsule by mouth daily.   liothyronine 25 MCG tablet Commonly known as: CYTOMEL Take 25 mcg by mouth daily.   losartan 25 MG tablet Commonly known as: COZAAR TAKE 1 TABLET BY MOUTH DAILY   metoprolol tartrate 25 MG tablet Commonly known as: LOPRESSOR TAKE 1 TABLET TWICE A DAY   multivitamin with minerals Tabs tablet Take 1 tablet by mouth daily.   NP Thyroid 120 MG tablet Generic drug: thyroid Take 1 tablet (120 mg total) by mouth in the morning and at bedtime. What changed: when to take this   progesterone 200 MG capsule Commonly known as: PROMETRIUM Take 2 capsules (400 mg total) by mouth daily.   RABEprazole 20 MG tablet Commonly known as: ACIPHEX TAKE ONE TABLET BY MOUTH TWICE A DAY BEFORE A MEAL What changed: See the new instructions.   rosuvastatin 40 MG tablet Commonly known as: CRESTOR Take 1 tablet (40 mg total) by mouth daily.   triamcinolone ointment 0.1 % Commonly known as: KENALOG Apply 1 Application topically 2 (two) times daily as needed. Started by:  Valentina Shaggy, MD        Birth History: non-contributory  Developmental History: non-contributory  Past Surgical History: Past Surgical History:  Procedure Laterality Date   ABDOMINAL AORTAGRAM  06/24/2012   ABDOMINAL AORTAGRAM N/A 06/24/2012   Procedure: ABDOMINAL Maxcine Ham;  Surgeon: Angelia Mould, MD;  Location[facility] C[facility] LAB;  Service: Cardiovascular;  Laterality: N/A;   AORTIC VALVE REPLACEMENT N/A 10/07/2014   Procedure: REDO AORTIC VALVE REPLACEMENT (AVR);  Surgeon: Rexene Alberts, MD;  Location: [place];  Service: Open Heart Surgery;  Laterality: N/A;   ASCENDING AORTIC ROOT REPLACEMENT N/A 10/07/2014   Procedure: ASCENDING AORTIC ROOT REPLACEMENT;  Surgeon: Rexene Alberts, MD;  Location: [place];  Service: Open Heart Surgery;  Laterality: N/A;   BIOPSY  09/27/2016   Procedure: BIOPSY;  Surgeon: Daneil Dolin, MD;  [facility];  Service: Endoscopy;;  colon   BIOPSY  10/18/2020   Procedure: BIOPSY;  Surgeon: Eloise Harman, DO;  [facility];  Service: Endoscopy;;   BREAST REDUCTION SURGERY Bilateral 01/21/2018   Procedure: BREAST REDUCTION WITH LIPOSUCTION;  Surgeon: Cristine Polio, MD;  Location: [place];  Service: Plastics;  Laterality: Bilateral;   CARDIAC VALVE SURGERY  1968   CARPAL TUNNEL RELEASE Right 03/02/2020   Procedure: CARPAL TUNNEL RELEASE;  Surgeon: Carole Civil, MD;  Location: [facility];  Service: Orthopedics;  Laterality: Right;   CATARACT EXTRACTION W/PHACO Left 05/30/2019   Procedure: CATARACT EXTRACTION PHACO AND INTRAOCULAR LENS PLACEMENT (IOC) (CDE: 4.94  );  Surgeon: Baruch Goldmann, MD;  Location: [facility];  Service: Ophthalmology;  Laterality: Left;   CATARACT EXTRACTION W/PHACO Right 06/16/2019   Procedure: CATARACT EXTRACTION PHACO AND INTRAOCULAR LENS PLACEMENT (IOC);  Surgeon: Baruch Goldmann, MD;  Location: [facility];  Service: Ophthalmology;  Laterality: Right;  CDE: 4.64   CERVICAL FUSION      CHOLECYSTECTOMY     COLONOSCOPY WITH PROPOFOL N/A 09/27/2016  Dr. Gala Romney: Diverticulosis, several tubular adenomas removed ranging 4 to 7 mm in size, internal grade 1 hemorrhoids, terminal ileum normal, segmental biopsies negative for microscopic colitis.  Next colonoscopy June 2021   COLONOSCOPY WITH PROPOFOL N/A 05/06/2020   internal hemorrhoids, sigmoid diverticulosis. Surveillance colonoscopy due in 2027   ESOPHAGOGASTRODUODENOSCOPY (EGD) WITH PROPOFOL N/A 09/27/2016   Dr. Gala Romney: Small hiatal hernia, mild Schatzki ring status post disruption, LA grade a esophagitis   ESOPHAGOGASTRODUODENOSCOPY (EGD) WITH PROPOFOL N/A 10/18/2020   non-obstructing mild Schatzki ring, gastritis s/ biopsy. Negative H.pylori.   ILIAC ARTERY STENT Left 12/2007   IR ANGIOGRAM EXTREMITY BILATERAL  07/12/2021   IR ANGIOGRAM VISCERAL SELECTIVE  10/29/2020   IR ANGIOGRAM VISCERAL SELECTIVE  07/12/2021   IR IVUS EACH ADDITIONAL NON CORONARY VESSEL  07/12/2021   IR RADIOLOGIST EVAL & MGMT  10/26/2020   IR RADIOLOGIST EVAL & MGMT  11/10/2020   IR RADIOLOGIST EVAL & MGMT  02/09/2021   IR RADIOLOGIST EVAL & MGMT  06/16/2021   IR RADIOLOGIST EVAL & MGMT  08/15/2021   IR RADIOLOGIST EVAL & MGMT  10/18/2021   IR THROMBECT SEC MECH MOD SED  07/12/2021   IR TRANSCATH PLC STENT 1ST ART NOT LE CV CAR VERT CAR  10/29/2020   IR TRANSCATH PLC STENT 1ST ART NOT LE CV CAR VERT CAR  07/12/2021   IR US GUIDE VASC ACCESS RIGHT  10/29/2020   IR US GUIDE VASC ACCESS RIGHT  07/12/2021   KNEE ARTHROSCOPY WITH MEDIAL MENISECTOMY Right 01/10/2018   Procedure: RIGHT KNEE ARTHROSCOPY WITH PARTIAL MEDIAL MENISECTOMY;  Surgeon: Carole Civil, MD;  [facility];  Service: Orthopedics;  Laterality: Right;   LEFT AND RIGHT HEART CATHETERIZATION WITH CORONARY ANGIOGRAM N/A 07/31/2014   Procedure: LEFT AND RIGHT HEART CATHETERIZATION WITH CORONARY ANGIOGRAM;  Surgeon: Burnell Blanks, MD;  Location[facility] C[facility] LAB;  Service:  Cardiovascular;  Laterality: N/A;   MALONEY DILATION N/A 09/27/2016   Procedure: Venia Minks DILATION;  Surgeon: Daneil Dolin, MD;  [facility];  Service: Endoscopy;  Laterality: N/A;   POLYPECTOMY  09/27/2016   Procedure: POLYPECTOMY;  Surgeon: Daneil Dolin, MD;  [facility];  Service: Endoscopy;;  colon   ROTATOR CUFF REPAIR Bilateral    TEE WITHOUT CARDIOVERSION N/A 07/07/2014   Procedure: TRANSESOPHAGEAL ECHOCARDIOGRAM (TEE);  Surgeon: Arnoldo Lenis, MD;  [facility];  Service: Cardiology;  Laterality: N/A;   TEE WITHOUT CARDIOVERSION N/A 07/20/2014   Procedure: TRANSESOPHAGEAL ECHOCARDIOGRAM (TEE) WITH PROPOFOL;  Surgeon: Arnoldo Lenis, MD;  [facility];  Service: Endoscopy;  Laterality: N/A;   TEE WITHOUT CARDIOVERSION N/A 10/07/2014   Procedure: TRANSESOPHAGEAL ECHOCARDIOGRAM (TEE);  Surgeon: Rexene Alberts, MD;  Location: [place];  Service: Open Heart Surgery;  Laterality: N/A;     Family History: Family History  Problem Relation Age of Onset   Hypertension Mother    Hypertension Father    Heart disease Father        before age 71   Other Father        varicose veins   COPD Father    Colon cancer Neg Hx    Celiac disease Neg Hx    Inflammatory bowel disease Neg Hx      Social History: Vilda lives at home with her husband.  She lives in a house that is also [address].  There is no mildew damage.  There are hardwoods in the bedroom.  She has gas heating  and central cooling.  There are dogs and cats inside of the home.  There are no dust mite covers on the bedding.  There is tobacco exposure in the home.  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  She is not exposed to fumes, chemicals, or dust.  She was a smoker through March 2023.  Her husband continues to smoke.   Review of Systems  Constitutional: Negative.  Negative for chills, fever, malaise/fatigue and weight loss.  HENT: Negative.   Negative for congestion, ear discharge and ear pain.   Eyes:  Negative for pain, discharge and redness.  Respiratory:  Negative for cough, sputum production, shortness of breath and wheezing.   Cardiovascular: Negative.  Negative for chest pain and palpitations.  Gastrointestinal:  Negative for abdominal pain, heartburn, nausea and vomiting.  Skin:  Positive for itching and rash.  Neurological:  Negative for dizziness and headaches.  Endo/Heme/Allergies:  Negative for environmental allergies. Does not bruise/bleed easily.       Objective:   Blood pressure 128/70, pulse 68, temperature 97.9 F (36.6 C), resp. rate 16, height 5' 4.5" (1.638 m), weight 219 lb 2 oz (99.4 kg), SpO2 98 %. Body mass index is 37.03 kg/m.     Physical Exam Constitutional:      Appearance: She is well-developed.  HENT:     Head: Normocephalic and atraumatic.     Right Ear: Tympanic membrane, ear canal and external ear normal. No drainage, swelling or tenderness. Tympanic membrane is not injected, scarred, erythematous, retracted or bulging.     Left Ear: Tympanic membrane, ear canal and external ear normal. No drainage, swelling or tenderness. Tympanic membrane is not injected, scarred, erythematous, retracted or bulging.     Nose: No nasal deformity, septal deviation, mucosal edema or rhinorrhea.     Right Sinus: No maxillary sinus tenderness or frontal sinus tenderness.     Left Sinus: No maxillary sinus tenderness or frontal sinus tenderness.     Mouth/Throat:     Mouth: Mucous membranes are not pale and not dry.     Pharynx: Uvula midline.  Eyes:     General:        Right eye: No discharge.        Left eye: No discharge.     Conjunctiva/sclera: Conjunctivae normal.     Right eye: Right conjunctiva is not injected. No chemosis.    Left eye: Left conjunctiva is not injected. No chemosis.    Pupils: Pupils are equal, round, and reactive to light.  Cardiovascular:     Rate and Rhythm: Normal rate  and regular rhythm.     Heart sounds: Normal heart sounds.  Pulmonary:     Effort: Pulmonary effort is normal. No tachypnea, accessory muscle usage or respiratory distress.     Breath sounds: Normal breath sounds. No wheezing, rhonchi or rales.  Chest:     Chest wall: No tenderness.  Abdominal:     Tenderness: There is no abdominal tenderness. There is no guarding or rebound.  Lymphadenopathy:     Head:     Right side of head: No submandibular, tonsillar or occipital adenopathy.     Left side of head: No submandibular, tonsillar or occipital adenopathy.     Cervical: No cervical adenopathy.  Skin:    General: Skin is warm.     Capillary Refill: Capillary refill takes less than 2 seconds.     Coloration: Skin is not pale.  Findings: Rash present. No abrasion, erythema or petechiae. Rash is pustular. Rash is not papular, urticarial or vesicular.     Comments: She has multiple pustular lesions on her arms as well as on her legs.   Neurological:     Mental Status: She is alert.              Salvatore Marvel, MD [facility] [place] [facility] [place]

## 2021-11-27 LAB — TRYPTASE: Tryptase: 8.5 ug/L (ref 2.2–13.2)

## 2021-12-01 ENCOUNTER — Ambulatory Visit: Payer: BC Managed Care – PPO | Admitting: Family Medicine

## 2021-12-06 ENCOUNTER — Encounter: Payer: Self-pay | Admitting: Gastroenterology

## 2021-12-06 ENCOUNTER — Ambulatory Visit: Payer: BC Managed Care – PPO | Admitting: Gastroenterology

## 2021-12-06 VITALS — BP 146/82 | HR 71 | Temp 97.7°F | Ht 64.0 in | Wt 224.4 lb

## 2021-12-06 DIAGNOSIS — K921 Melena: Secondary | ICD-10-CM | POA: Insufficient documentation

## 2021-12-06 DIAGNOSIS — K642 Third degree hemorrhoids: Secondary | ICD-10-CM | POA: Insufficient documentation

## 2021-12-06 DIAGNOSIS — R1032 Left lower quadrant pain: Secondary | ICD-10-CM | POA: Insufficient documentation

## 2021-12-06 MED ORDER — HYDROCORTISONE (PERIANAL) 2.5 % EX CREA: 1.0000 | TOPICAL_CREAM | Freq: Two times a day (BID) | CUTANEOUS | 1 refills | Status: DC

## 2021-12-06 MED ORDER — DICYCLOMINE HCL 10 MG PO CAPS: 10.0000 mg | ORAL_CAPSULE | Freq: Three times a day (TID) | ORAL | 1 refills | Status: DC | PRN

## 2021-12-06 NOTE — Patient Instructions (Signed)
Please have blood work done. Please complete stool sample and return in person or mail to our office.  I have sent in Anusol cream (steroid) to use as needed when hemorrhoids flare. You can continue the other things as well, but use the Anusol twice a day when flaring.  For abdominal cramping and loose stool,let's try dicyclomine (Bentyl) every 8 hours as needed. This can cause constipation, dry mouth, dizziness, etc. Stop taking if any of this occurs.   Further recommendations after blood work completed!  I enjoyed seeing you again today! As you know, I value our relationship and want to provide genuine, compassionate, and quality care. I welcome your feedback. If you receive a survey regarding your visit,  I greatly appreciate you taking time to fill this out. See you next time!  Annitta Needs, PhD, ANP-BC [facility]

## 2021-12-06 NOTE — Progress Notes (Signed)
Gastroenterology Office Note     Primary Care Physician:  Wendie Agreste, MD  Primary Gastroenterologist: Dr. Gala Romney    Chief Complaint   Chief Complaint  Patient presents with   Hemorrhoids    Some abdominal pain, some diarrhea and stool is black      History of Present Illness   Michelle Patton is a 66 y.o. female presenting today in follow-up with a history of abdominal pain, CDiff, found to have chronic mesenteric ischemia in work-up through our office. She underwent superior mesenteric artery covered stent placement 10/29/20 and repeat imaging 06/09/21  showed the stent to be occluded. She presented to IR 07/12/21 for an elective mesenteric and lower extremity angiogram with SMA stent recanalization and balloon angioplasty of the proximal SMA. In interim from last appointment in May 2023, she had an anaphylactic reaction to contrast media at time of CTA and was hospitalized.   She continues to see Vascular and will plan on mesenteric duplex going forward.   Continued abdominal pain. LLQ discomfort intermittently with more frequent BMs.. Has to take fiber daily, which keeps her from having diarrhea. Will have a day with no BM then will have multiple bowel movements 4-6 times.  Stools black for a month.   Hemorrhoids are intermittent. Will bleed like having a period when these flare. Will be painful when flared. Right now they are stable. Using Recticare (lidocaine).  Calmol 4 suppositories are helpful when flared significantly.     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   Redo sternotomy for 21 mm Medtronic Freestyle porcine aortic root graft w/ reimplantation of left main and right coronary arteries   Sleep apnea    diagnosed multiple years ago at [facility] aortic stenosis, congenital - s/p repair during childhood     Past Surgical History:  Procedure Laterality Date   ABDOMINAL AORTAGRAM  06/24/2012   ABDOMINAL AORTAGRAM N/A 06/24/2012   Procedure: ABDOMINAL Maxcine Ham;  Surgeon: Angelia Mould, MD;  Location: [facility] CATH LAB;  Service: Cardiovascular;  Laterality: N/A;   AORTIC VALVE REPLACEMENT N/A 10/07/2014   Procedure: REDO AORTIC VALVE REPLACEMENT (AVR);  Surgeon: Rexene Alberts, MD;  Location: [place];  Service: Open Heart Surgery;  Laterality: N/A;   ASCENDING AORTIC ROOT REPLACEMENT N/A 10/07/2014   Procedure: ASCENDING AORTIC ROOT REPLACEMENT;  Surgeon: Rexene Alberts, MD;  Location: [place];  Service: Open Heart Surgery;  Laterality: N/A;   BIOPSY  09/27/2016   Procedure: BIOPSY;  Surgeon: Daneil Dolin, MD;  [facility];  Service: Endoscopy;;  colon   BIOPSY  10/18/2020   Procedure: BIOPSY;  Surgeon: Eloise Harman, DO;  [facility];  Service: Endoscopy;;   BREAST REDUCTION SURGERY Bilateral 01/21/2018   Procedure: BREAST REDUCTION WITH LIPOSUCTION;  Surgeon: Cristine Polio, MD;  Location: [place];  Service: Plastics;  Laterality: Bilateral;  CARDIAC VALVE SURGERY  1968   CARPAL TUNNEL RELEASE Right 03/02/2020   Procedure: CARPAL TUNNEL RELEASE;  Surgeon: Carole Civil, MD;  Location: [facility];  Service: Orthopedics;  Laterality: Right;   CATARACT EXTRACTION W/PHACO Left 05/30/2019   Procedure: CATARACT EXTRACTION PHACO AND INTRAOCULAR LENS PLACEMENT (IOC) (CDE: 4.94  );  Surgeon: Baruch Goldmann, MD;  Location: [facility];  Service: Ophthalmology;  Laterality: Left;    CATARACT EXTRACTION W/PHACO Right 06/16/2019   Procedure: CATARACT EXTRACTION PHACO AND INTRAOCULAR LENS PLACEMENT (IOC);  Surgeon: Baruch Goldmann, MD;  Location: [facility];  Service: Ophthalmology;  Laterality: Right;  CDE: 4.64   CERVICAL FUSION     CHOLECYSTECTOMY     COLONOSCOPY WITH PROPOFOL N/A 09/27/2016   Dr. Gala Romney: Diverticulosis, several tubular adenomas removed ranging 4 to 7 mm in size, internal grade 1 hemorrhoids, terminal ileum normal, segmental biopsies negative for microscopic colitis.  Next colonoscopy June 2021   COLONOSCOPY WITH PROPOFOL N/A 05/06/2020   internal hemorrhoids, sigmoid diverticulosis. Surveillance colonoscopy due in 2027   ESOPHAGOGASTRODUODENOSCOPY (EGD) WITH PROPOFOL N/A 09/27/2016   Dr. Gala Romney: Small hiatal hernia, mild Schatzki ring status post disruption, LA grade a esophagitis   ESOPHAGOGASTRODUODENOSCOPY (EGD) WITH PROPOFOL N/A 10/18/2020   non-obstructing mild Schatzki ring, gastritis s/ biopsy. Negative H.pylori.   ILIAC ARTERY STENT Left 12/2007   IR ANGIOGRAM EXTREMITY BILATERAL  07/12/2021   IR ANGIOGRAM VISCERAL SELECTIVE  10/29/2020   IR ANGIOGRAM VISCERAL SELECTIVE  07/12/2021   IR IVUS EACH ADDITIONAL NON CORONARY VESSEL  07/12/2021   IR RADIOLOGIST EVAL & MGMT  10/26/2020   IR RADIOLOGIST EVAL & MGMT  11/10/2020   IR RADIOLOGIST EVAL & MGMT  02/09/2021   IR RADIOLOGIST EVAL & MGMT  06/16/2021   IR RADIOLOGIST EVAL & MGMT  08/15/2021   IR RADIOLOGIST EVAL & MGMT  10/18/2021   IR THROMBECT SEC MECH MOD SED  07/12/2021   IR TRANSCATH PLC STENT 1ST ART NOT LE CV CAR VERT CAR  10/29/2020   IR TRANSCATH PLC STENT 1ST ART NOT LE CV CAR VERT CAR  07/12/2021   IR US GUIDE VASC ACCESS RIGHT  10/29/2020   IR US GUIDE VASC ACCESS RIGHT  07/12/2021   KNEE ARTHROSCOPY WITH MEDIAL MENISECTOMY Right 01/10/2018   Procedure: RIGHT KNEE ARTHROSCOPY WITH PARTIAL MEDIAL MENISECTOMY;  Surgeon: Carole Civil, MD;  Location: [facility];  Service: Orthopedics;   Laterality: Right;   LEFT AND RIGHT HEART CATHETERIZATION WITH CORONARY ANGIOGRAM N/A 07/31/2014   Procedure: LEFT AND RIGHT HEART CATHETERIZATION WITH CORONARY ANGIOGRAM;  Surgeon: Burnell Blanks, MD;  Location[facility] CATH LAB;  Service: Cardiovascular;  Laterality: N/A;   MALONEY DILATION N/A 09/27/2016   Procedure: Venia Minks DILATION;  Surgeon: Daneil Dolin, MD;  [facility];  Service: Endoscopy;  Laterality: N/A;   POLYPECTOMY  09/27/2016   Procedure: POLYPECTOMY;  Surgeon: Daneil Dolin, MD;  [facility];  Service: Endoscopy;;  colon   ROTATOR CUFF REPAIR Bilateral    TEE WITHOUT CARDIOVERSION N/A 07/07/2014   Procedure: TRANSESOPHAGEAL ECHOCARDIOGRAM (TEE);  Surgeon: Arnoldo Lenis, MD;  [facility];  Service: Cardiology;  Laterality: N/A;   TEE WITHOUT CARDIOVERSION N/A 07/20/2014   Procedure: TRANSESOPHAGEAL ECHOCARDIOGRAM (TEE) WITH PROPOFOL;  Surgeon: Arnoldo Lenis, MD;  Location: [facility];  Service: Endoscopy;  Laterality: N/A;   TEE WITHOUT CARDIOVERSION N/A 10/07/2014   Procedure: TRANSESOPHAGEAL ECHOCARDIOGRAM (TEE);  Surgeon: Rexene Alberts, MD;  Location: [place];  Service: Open Heart Surgery;  Laterality: N/A;    Current Outpatient Medications  Medication Sig Dispense Refill   aspirin EC 81 MG tablet Take 1 tablet (81 mg total) by mouth daily. Swallow whole. 90 tablet 3   Cholecalciferol (DIALYVITE VITAMIN D 5000) 125 MCG (5000 UT) capsule Take 5,000 Units by mouth daily.     clopidogrel (PLAVIX) 75 MG tablet Take 1 tablet (75 mg total) by mouth daily. 90 tablet 3   diclofenac (VOLTAREN) 75 MG EC tablet TAKE 1 TABLET TWICE A DAY (Patient taking differently: Take 75 mg by mouth 2 (two) times daily.) 60 tablet 11   estradiol (ESTRACE) 2 MG tablet Take 1 tablet (2 mg total) by mouth daily. 30 tablet 3   Flaxseed, Linseed, (FLAX SEED OIL PO) Take 1 capsule by mouth daily.     losartan (COZAAR) 25 MG tablet TAKE 1 TABLET BY MOUTH  DAILY (Patient taking differently: Take 25 mg by mouth daily.) 90 tablet 3   metoprolol tartrate (LOPRESSOR) 25 MG tablet TAKE 1 TABLET TWICE A DAY (Patient taking differently: Take 25 mg by mouth 2 (two) times daily.) 180 tablet 3   Multiple Minerals (CALCIUM-MAGNESIUM-ZINC) TABS Take 1 tablet by mouth daily.     Multiple Vitamin (MULTIVITAMIN WITH MINERALS) TABS tablet Take 1 tablet by mouth daily.     progesterone (PROMETRIUM) 200 MG capsule Take 2 capsules (400 mg total) by mouth daily. 180 capsule 1   RABEprazole (ACIPHEX) 20 MG tablet TAKE ONE TABLET BY MOUTH TWICE A DAY BEFORE A MEAL (Patient taking differently: Take 20 mg by mouth in the morning and at bedtime.) 180 tablet 3   rosuvastatin (CRESTOR) 40 MG tablet Take 1 tablet (40 mg total) by mouth daily. 90 tablet 3   triamcinolone ointment (KENALOG) 0.1 % Apply 1 Application topically 2 (two) times daily as needed. 454 g 0   liothyronine (CYTOMEL) 25 MCG tablet Take 25 mcg by mouth daily. (Patient not taking: Reported on 11/25/2021)     NP THYROID 120 MG tablet Take 1 tablet (120 mg total) by mouth in the morning and at bedtime. (Patient not taking: Reported on 12/06/2021) 180 tablet 1   No current facility-administered medications for this visit.    Allergies as of 12/06/2021 - Review Complete 12/06/2021  Allergen Reaction Noted   Iodinated contrast media Anaphylaxis, Shortness Of Breath, Itching, Swelling, and Other (See Comments) 10/11/2021   Penicillins Hives    Sulfa antibiotics Nausea And Vomiting 06/19/2012    Family History  Problem Relation Age of Onset   Hypertension Mother    Hypertension Father    Heart disease Father        before age 45   Other Father        varicose veins   COPD Father    Colon cancer Neg Hx    Celiac disease Neg Hx    Inflammatory bowel disease Neg Hx     Social History   Socioeconomic History   Marital status: Married    Spouse name: Not on file   Number of children: 0   Years of  education: some coll   Highest education level: Not on file  Occupational History   Occupation: call center    Employer: [employer]    Comment: [employer]  Tobacco Use   Smoking status: Former    Packs/day: 0.50    Years: 40.00    Total pack years: 20.00    Types: Cigarettes    Quit date: 05/2021  Years since quitting: 0.5   Smokeless tobacco: Never  Vaping Use   Vaping Use: Never used  Substance and Sexual Activity   Alcohol use: Yes    Alcohol/week: 2.0 standard drinks of alcohol    Types: 2 Cans of beer per week   Drug use: No   Sexual activity: Yes    Birth control/protection: None  Other Topics Concern   Not on file  Social History Narrative   Patient is married   for 5 years . Patient works at [employer].. Some college education-did not Writer.Originally from [place].   Social Determinants of Health   Financial Resource Strain: Not on file  Food Insecurity: Not on file  Transportation Needs: Not on file  Physical Activity: Not on file  Stress: Not on file  Social Connections: Not on file  Intimate Partner Violence: Not on file     Review of Systems   Gen: Denies any fever, chills, fatigue, weight loss, lack of appetite.  CV: Denies chest pain, heart palpitations, peripheral edema, syncope.  Resp: Denies shortness of breath at rest or with exertion. Denies wheezing or cough.  GI: see HPI GU : Denies urinary burning, urinary frequency, urinary hesitancy MS: Denies joint pain, muscle weakness, cramps, or limitation of movement.  Derm: Denies rash, itching, dry skin Psych: Denies depression, anxiety, memory loss, and confusion Heme: see HPI   Physical Exam   BP (!) 146/82 (BP Location: Right Arm, Patient Position: Sitting, Cuff Size: Normal)   Pulse 71   Temp 97.7 F (36.5 C) (Temporal)   Ht '5\' 4"'$  (1.626 m)   Wt 224 lb 6.4 oz (101.8 kg)   BMI 38.52 kg/m  General:   Alert and oriented. Pleasant and cooperative. Well-nourished and  well-developed.  Head:  Normocephalic and atraumatic. Eyes:  Without icterus Abdomen:  +BS, soft, TTP LUQ and LLQ and non-distended. No HSM noted. No guarding or rebound. No masses appreciated.  Rectal:  hemorrhoid skin tags, no mass on DRE, no pain Msk:  Symmetrical without gross deformities. Normal posture. Extremities:  Without edema. Neurologic:  Alert and  oriented x4;  grossly normal neurologically. Skin:  Intact without significant lesions or rashes. Psych:  Alert and cooperative. Normal mood and affect.   Assessment   Michelle Patton is a 66 y.o. female presenting today in follow-up with a history of abdominal pain, CDiff, found to have chronic mesenteric ischemia in work-up through our office and underwent  superior mesenteric artery covered stent placement 10/29/20 and then elective mesenteric and lower extremity angiogram with SMA stent recanalization and balloon angioplasty of the proximal SMA in March 2023. Continues to follow closely with Vascular.  Grade 3 hemorrhoids are an issue at this time; she has had intermittent symptoms with spans of relief between. We could consider banding if significant bleeding in the future, but she is at higher risk for post-banding bleed on Plavix. As she has not tried steroidal preparations, I have sent in Anusol to use BID as needed. Can continue other OTC agents such as lidocaine if needed.   Abdominal pain: noted LLQ/LUQ. This has been intermittent and does have association with BMs. Alternating constipation and looser stool. Will trial dicyclomine just as needed for discomfort. Discussed potential side effects. Continue fiber.  Reported melena: unclear if true melena or not. However, as she is on Plavix and aspirin, she could be oozing anywhere in GI tract. Check CBC and ifbot today. EGD/colonoscopy from last year on file. If positive  and/or anemia, would pursue EGD.  Anaphylaxis reaction to contrast media dye: at time of most recent CTA.  Allergy list updated   PLAN   CBC and ifobt now Trial of dicyclomine Anusol BID prn Follow-up in 3 months   Annitta Needs, PhD, [facility]astroenterology

## 2021-12-12 NOTE — Progress Notes (Unsigned)
Cardiology Office Note    Date:  12/15/2021   ID:  Michelle, Patton 11-21-1955, MRN [id number]  PCP:  Michelle Agreste, MD (Previously Dr. Anastasio Patton) Cardiologist:  Michelle Dolly, MD  Electrophysiologist:  None   Chief Complaint: f/u aortic valve disease  History of Present Illness:   Michelle Patton is a 66 y.o. female originally from [place] with history of aortic stenosis (corrective surgery age 72 then progressive AS s/p AVR with root replacement utilizing porcine aortic root graft in 2016), PAD with remote LEIA stent previously followed by vascular surgery, mesenteric ischemia with stent to SMA 10/2020 by IR followed by GI (occluded 05/2021, with recanalization 06/2021), HTN, HLD, carotid artery disease, OSA, GERD, anxiety, anaphylaxis to contrast media 09/2021 who is seen for follow-up. Regarding AS, she has a history of supravalvular aortic stenosis at age 84 treated with corrective surgery with patching at that time. In late 2015 she developed significant exercise intolerance just raking leaves. She was found to have developed severe aortic valvular stenosis with possible recurrence of supravalvular stenosis as well based on imaging. Per [facility] discharge summary, she underwent Aortic Valve Replacement with Root Replacement utilizing a 21 mm Freestyle bioprosthetic porcine valve/aortic root graft on 10/07/14. Of note, pre-procedure cath in 2016 showed no evidence of CAD. Last echo 2019 showed EF 65-70%, G1DD, no significant AS/AI, mild TR, PASP 80mHg. Carotid duplex 2016 with 1-39% BICA.  She returns for follow-up today stating she has had a tough year. She had a severe anaphylactic reaction to contrast media in 09/2021. [place], [place], and the people that live here. She previously followed with Dr. GAnastasio Championand now follows with [facility] to help manage her thyroid and  hormone levels. She is now established with Dr. GCarlota Patton She reports chronic intermittent brief chest pains that she states have occurred almost her whole life. When asked for further details she states she does not know. She does report this does not happen with exertion. She can go to sleep and it will be gone. She denies any specific change in quality, frequency, or caliber in this symptom over her lifetime. She denies any difficulty with her breathing. She reports that pre-dental antibiotics have been managed by her dentist.   Labwork independently reviewed: 09/2021 BNP and troponins normal, Hgb 12.0, plt 219, K 4.3, Cr 0.92, albumin 3.0, AST/ALT OK 10/2020 TSH <0.01, FT3 wnl 05/2020 trig 347, LDL 149  Cardiology Studies:   Studies reviewed are outlined and summarized above. Reports included below if pertinent.   Echo 09/2017  - Left ventricle: The cavity size was normal. Wall thickness was    normal. Systolic function was vigorous. The estimated ejection    fraction was in the range of 65% to 70%. Wall motion was normal;    there were no regional wall motion abnormalities. Doppler    parameters are consistent with abnormal left ventricular    relaxation (grade 1 diastolic dysfunction). Doppler parameters    are consistent with indeterminate ventricular filling pressure.  - Aortic valve: 21 mm Medtronic Freestyle stentless porcine    valve/aortic root graft in place. There was no significant    regurgitation. There was no stenosis. Peak gradient (S): 11 mm    Hg.  - Mitral valve: Mildly calcified annulus. Normal thickness leaflets    .  - Tricuspid valve: There was mild regurgitation.  -  Pulmonary arteries: PA peak pressure: 35 mm Hg (S).  Carotid 2016   - The vertebral arteries appear patent with antegrade flow.  - Findings consistent with 1- 39 percent stenosis involving the    right internal carotid artery and the left internal carotid    artery.   Cor CT 2016 IMPRESSION: 1.  Trileaflet, severely thickened, moderately calcified aortic valve with significantly limited leaflet opening. No supravalvular membrane of supravalvular re-stenosis is seen.   2. Right and left coronary cusp are narrowed with limited communication between the cusps and aorta itself with possible limited blood supply to RCA. This narrowing is most probably a result of patch/scar strictures.   3. Normal coronary origin, right dominance, minimal non-obstructive CAD.   4.  No other congenital abnormalities were identified.   Michelle Patton     Electronically Signed   By: Michelle Patton   On: 09/17/2014 14:30  Cath 2016  Hemodynamic Findings: Ao:   107/55             LV: 190/12/15 RA:  1         RV:  41/2/6 PA:   26/6 (mean 15)     PCWP:  7 Fick Cardiac Output: 4.76 L/min Fick Cardiac Index: 2.38 L/min/m2 Central Aortic Saturation: 96% Pulmonary Artery Saturation: 66%   Aortic valve data:  Peak to peak gradient 83 mm Hg Mean gradient 56 mmHg AVA 0.52 cm2   Angiographic Findings:   Left main: Normal caliber vessel with no obstructive disease.    Left Anterior Descending Artery: Large caliber vessel that courses to the apex. Moderate caliber diagonal branch. No obstructive disease.    Circumflex Artery: Large caliber vessel with moderate caliber obtuse marginal branch. No obstructive disease.    Right Coronary Artery: Large dominant vessel with no obstructive disease.    Left Ventricular Angiogram: LVEF=60%   Aortic root angiogram: The aortic root is not enlarged. There is supravalvular density.    Aortic valve calcification noted on plain fluoroscopy.    Impression: 1. No angiographic evidence of CAD 2. Normal LV systolic function 3. Severe gradient across the aortic valve into the aorta with density noted in the aortic root. Cannot exclude recurrent supravalvular stenosis along with aortic valve stenosis. Her aortic valve on TEE is functionally bicuspid,  calcified and opens abnormally.  4. Normal filling pressures    cMRI 2016   IMPRESSION: 1.  Normal LV size with mild to moderate LV hypertrophy, EF 59%.   2.  Normal RV size and systolic function.   3. Misshapen and thickened but trileaflet aortic valve. One leaflet has some degree of prolapse. There was restriction to aortic valve opening with a degree of stenosis. Just superior to the aortic valve, I am concerned about possible residual membrane or web creating additional obstruction. This is difficult to visualize.   Dalton Mclean     Electronically Signed   By: Loralie Champagne M.D.   On: 08/27/2014 19:26    Past Medical History:  Diagnosis Date   Aortic regurgitation 07/20/2014   Aortic stenosis, severe 07/20/2014   Arthritis    DVT (deep venous thrombosis) (HCC)    Family history of adverse reaction to anesthesia    Reports father deliurm in his 78's with CABG   GERD (gastroesophageal reflux disease)    History of pneumonia    Hyperlipidemia    Hypertension    Insomnia, unspecified    Muscle weakness (generalized)    Peripheral artery disease ([place])  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   Redo sternotomy for 21 mm Medtronic Freestyle porcine aortic root graft w/ reimplantation of left main and right coronary arteries   Sleep apnea    diagnosed multiple years ago at [facility] aortic stenosis, congenital - s/p repair during childhood     Past Surgical History:  Procedure Laterality Date   ABDOMINAL AORTAGRAM  06/24/2012   ABDOMINAL AORTAGRAM N/A 06/24/2012   Procedure: ABDOMINAL Maxcine Ham;  Surgeon: Angelia Mould, MD;  Location: [facility] CATH LAB;  Service: Cardiovascular;  Laterality: N/A;   AORTIC VALVE REPLACEMENT N/A 10/07/2014   Procedure: REDO AORTIC VALVE REPLACEMENT (AVR);  Surgeon: Rexene Alberts, MD;  Location: [place];  Service: Open Heart Surgery;  Laterality: N/A;   ASCENDING AORTIC  ROOT REPLACEMENT N/A 10/07/2014   Procedure: ASCENDING AORTIC ROOT REPLACEMENT;  Surgeon: Rexene Alberts, MD;  Location: [place];  Service: Open Heart Surgery;  Laterality: N/A;   BIOPSY  09/27/2016   Procedure: BIOPSY;  Surgeon: Daneil Dolin, MD;  [facility];  Service: Endoscopy;;  colon   BIOPSY  10/18/2020   Procedure: BIOPSY;  Surgeon: Eloise Harman, DO;  [facility];  Service: Endoscopy;;   BREAST REDUCTION SURGERY Bilateral 01/21/2018   Procedure: BREAST REDUCTION WITH LIPOSUCTION;  Surgeon: Cristine Polio, MD;  Location: [place];  Service: Plastics;  Laterality: Bilateral;   CARDIAC VALVE SURGERY  1968   CARPAL TUNNEL RELEASE Right 03/02/2020   Procedure: CARPAL TUNNEL RELEASE;  Surgeon: Carole Civil, MD;  Location: [facility];  Service: Orthopedics;  Laterality: Right;   CATARACT EXTRACTION W/PHACO Left 05/30/2019   Procedure: CATARACT EXTRACTION PHACO AND INTRAOCULAR LENS PLACEMENT (IOC) (CDE: 4.94  );  Surgeon: Baruch Goldmann, MD;  Location: [facility];  Service: Ophthalmology;  Laterality: Left;   CATARACT EXTRACTION W/PHACO Right 06/16/2019   Procedure: CATARACT EXTRACTION PHACO AND INTRAOCULAR LENS PLACEMENT (IOC);  Surgeon: Baruch Goldmann, MD;  Location: [facility];  Service: Ophthalmology;  Laterality: Right;  CDE: 4.64   CERVICAL FUSION     CHOLECYSTECTOMY     COLONOSCOPY WITH PROPOFOL N/A 09/27/2016   Dr. Gala Romney: Diverticulosis, several tubular adenomas removed ranging 4 to 7 mm in size, internal grade 1 hemorrhoids, terminal ileum normal, segmental biopsies negative for microscopic colitis.  Next colonoscopy June 2021   COLONOSCOPY WITH PROPOFOL N/A 05/06/2020   internal hemorrhoids, sigmoid diverticulosis. Surveillance colonoscopy due in 2027   ESOPHAGOGASTRODUODENOSCOPY (EGD) WITH PROPOFOL N/A 09/27/2016   Dr. Gala Romney: Small hiatal hernia, mild Schatzki ring status post disruption, LA grade a esophagitis    ESOPHAGOGASTRODUODENOSCOPY (EGD) WITH PROPOFOL N/A 10/18/2020   non-obstructing mild Schatzki ring, gastritis s/ biopsy. Negative H.pylori.   ILIAC ARTERY STENT Left 12/2007   IR ANGIOGRAM EXTREMITY BILATERAL  07/12/2021   IR ANGIOGRAM VISCERAL SELECTIVE  10/29/2020   IR ANGIOGRAM VISCERAL SELECTIVE  07/12/2021   IR IVUS EACH ADDITIONAL NON CORONARY VESSEL  07/12/2021   IR RADIOLOGIST EVAL & MGMT  10/26/2020   IR RADIOLOGIST EVAL & MGMT  11/10/2020   IR RADIOLOGIST EVAL & MGMT  02/09/2021   IR RADIOLOGIST EVAL & MGMT  06/16/2021   IR RADIOLOGIST EVAL & MGMT  08/15/2021   IR RADIOLOGIST EVAL & MGMT  10/18/2021   IR THROMBECT SEC MECH MOD SED  07/12/2021   IR TRANSCATH PLC STENT 1ST ART NOT LE CV CAR VERT CAR  10/29/2020   IR TRANSCATH PLC  STENT 1ST ART NOT LE CV CAR VERT CAR  07/12/2021   IR US GUIDE VASC ACCESS RIGHT  10/29/2020   IR US GUIDE VASC ACCESS RIGHT  07/12/2021   KNEE ARTHROSCOPY WITH MEDIAL MENISECTOMY Right 01/10/2018   Procedure: RIGHT KNEE ARTHROSCOPY WITH PARTIAL MEDIAL MENISECTOMY;  Surgeon: Carole Civil, MD;  [facility];  Service: Orthopedics;  Laterality: Right;   LEFT AND RIGHT HEART CATHETERIZATION WITH CORONARY ANGIOGRAM N/A 07/31/2014   Procedure: LEFT AND RIGHT HEART CATHETERIZATION WITH CORONARY ANGIOGRAM;  Surgeon: Burnell Blanks, MD;  Location: [facility] CATH LAB;  Service: Cardiovascular;  Laterality: N/A;   MALONEY DILATION N/A 09/27/2016   Procedure: Venia Minks DILATION;  Surgeon: Daneil Dolin, MD;  [facility];  Service: Endoscopy;  Laterality: N/A;   POLYPECTOMY  09/27/2016   Procedure: POLYPECTOMY;  Surgeon: Daneil Dolin, MD;  [facility];  Service: Endoscopy;;  colon   ROTATOR CUFF REPAIR Bilateral    TEE WITHOUT CARDIOVERSION N/A 07/07/2014   Procedure: TRANSESOPHAGEAL ECHOCARDIOGRAM (TEE);  Surgeon: Arnoldo Lenis, MD;  [facility];  Service: Cardiology;  Laterality: N/A;   TEE WITHOUT CARDIOVERSION N/A  07/20/2014   Procedure: TRANSESOPHAGEAL ECHOCARDIOGRAM (TEE) WITH PROPOFOL;  Surgeon: Arnoldo Lenis, MD;  [facility];  Service: Endoscopy;  Laterality: N/A;   TEE WITHOUT CARDIOVERSION N/A 10/07/2014   Procedure: TRANSESOPHAGEAL ECHOCARDIOGRAM (TEE);  Surgeon: Rexene Alberts, MD;  Location: [place];  Service: Open Heart Surgery;  Laterality: N/A;    Current Medications: Current Meds  Medication Sig   aspirin EC 81 MG tablet Take 1 tablet (81 mg total) by mouth daily. Swallow whole.   Cholecalciferol (DIALYVITE VITAMIN D 5000) 125 MCG (5000 UT) capsule Take 5,000 Units by mouth daily.   clopidogrel (PLAVIX) 75 MG tablet Take 1 tablet (75 mg total) by mouth daily.   diclofenac (VOLTAREN) 75 MG EC tablet TAKE 1 TABLET TWICE A DAY (Patient taking differently: Take 75 mg by mouth 2 (two) times daily.)   dicyclomine (BENTYL) 10 MG capsule Take 1 capsule (10 mg total) by mouth every 8 (eight) hours as needed for spasms. For left-sided discomfort and frequent stools. Watch for constipation, dry mouth, dizziness.   estradiol (ESTRACE) 2 MG tablet Take 1 tablet (2 mg total) by mouth daily.   Flaxseed, Linseed, (FLAX SEED OIL PO) Take 1 capsule by mouth daily.   hydrocortisone (ANUSOL-HC) 2.5 % rectal cream Place 1 Application rectally 2 (two) times daily. As needed for rectal bleeding.   losartan (COZAAR) 25 MG tablet TAKE 1 TABLET BY MOUTH DAILY (Patient taking differently: Take 25 mg by mouth daily.)   metoprolol tartrate (LOPRESSOR) 25 MG tablet TAKE 1 TABLET TWICE A DAY (Patient taking differently: Take 25 mg by mouth 2 (two) times daily.)   Multiple Minerals (CALCIUM-MAGNESIUM-ZINC) TABS Take 1 tablet by mouth daily.   Multiple Vitamin (MULTIVITAMIN WITH MINERALS) TABS tablet Take 1 tablet by mouth daily.   RABEprazole (ACIPHEX) 20 MG tablet TAKE ONE TABLET BY MOUTH TWICE A DAY BEFORE A MEAL (Patient taking differently: Take 20 mg by mouth in the morning and at bedtime.)   rosuvastatin  (CRESTOR) 40 MG tablet Take 1 tablet (40 mg total) by mouth daily.   triamcinolone ointment (KENALOG) 0.1 % Apply 1 Application topically 2 (two) times daily as needed.     Allergies:   Iodinated contrast media, Penicillins, and Sulfa antibiotics   Social History   Socioeconomic History   Marital status:  Married    Spouse name: Not on file   Number of children: 0   Years of education: some coll   Highest education level: Not on file  Occupational History   Occupation: call center    Employer: [employer]    Comment: [employer]  Tobacco Use   Smoking status: Former    Packs/day: 0.50    Years: 40.00    Total pack years: 20.00    Types: Cigarettes    Quit date: 05/2021    Years since quitting: 0.5   Smokeless tobacco: Never  Vaping Use   Vaping Use: Never used  Substance and Sexual Activity   Alcohol use: Yes    Alcohol/week: 2.0 standard drinks of alcohol    Types: 2 Cans of beer per week   Drug use: No   Sexual activity: Yes    Birth control/protection: None  Other Topics Concern   Not on file  Social History Narrative   Patient is married   for 5 years . Patient works at [employer].. Some college education-did not Writer.Originally from [place].   Social Determinants of Health   Financial Resource Strain: Not on file  Food Insecurity: Not on file  Transportation Needs: Not on file  Physical Activity: Not on file  Stress: Not on file  Social Connections: Not on file     Family History:  The patient's family history includes COPD in her father; Heart disease in her father; Hypertension in her father and mother; Other in her father. There is no history of Colon cancer, Celiac disease, or Inflammatory bowel disease.  ROS:   Please see the history of present illness.  All other systems are reviewed and otherwise negative.    EKG(s)/Additional Labs   EKG:  EKG is not ordered today but reviewed recent tracing 10/17/21 with NSR, anteroseptal Q's V1-V2  stable from prior, nonspecific ST upsloping similar to prior in 2022. No acute change.  Recent Labs: 10/11/2021: ALT 16; B Natriuretic Peptide 39.0; BUN 15; Creatinine, Ser 0.92; Potassium 4.3; Sodium 135 12/13/2021: Hemoglobin 13.9; Platelets 264  Recent Lipid Panel    Component Value Date/Time   CHOL 171 06/17/2021 0743   CHOL 181 09/26/2012 0954   TRIG 172 (H) 06/17/2021 0743   HDL 52 06/17/2021 0743   HDL 64 09/26/2012 0954   CHOLHDL 3.3 06/17/2021 0743   LDLCALC 92 06/17/2021 0743    PHYSICAL EXAM:    VS:  BP 116/66   Pulse 77   Ht '5\' 5"'$  (1.651 m)   Wt 225 lb (102.1 kg)   SpO2 97%   BMI 37.44 kg/m   BMI: Body mass index is 37.44 kg/m.  GEN: Well nourished, well developed female in no acute distress HEENT: normocephalic, atraumatic Neck: no JVD or masses. Bilateral carotid bruits noted Cardiac: RRR; 2/6 SEM RUSB, no rubs or gallops, no edema  Respiratory: clear to auscultation bilaterally, normal work of breathing GI: soft, nontender, nondistended, + BS MS: no deformity or atrophy Skin: warm and dry, no rash Neuro:  Alert and Oriented x 3, Strength and sensation are intact, follows commands Wellbeing/Psych: curt, reports she is generally unhappy, no SI/HI  Wt Readings from Last 3 Encounters:  12/15/21 225 lb (102.1 kg)  12/06/21 224 lb 6.4 oz (101.8 kg)  11/25/21 219 lb 2 oz (99.4 kg)     ASSESSMENT & PLAN:   1. Aortic stenosis - corrective surgery age 40 then progressive AS s/p AVR with root replacement utilizing porcine aortic  root graft in 2016). Soft murmur on exam. Reports chronic non-anginal type intermittent chest pain for almost her entire life without acute change. Will update echocardiogram to follow gradient trends and valve function. I reviewed with Dr. Harl Bowie pre-visit whether SBE ppx should be observed; he agrees, and patient confirms that her antibiotics are prescribed by her dentist.  2. Essential HTN - controlled on present regimen. Continue  losartan, metoprolol.  3. Hyperlipidemia - rosuvastatin was restarted at last visit. I have suggested fasting lipid panel. She states she cannot get this done due to her work schedule. I suggested perhaps blocking a period of time in the afternoon for fasting instead so that she can get this done. We will put in the order for her to come at her convenience.  4. Bilateral carotid bruits - will arrange carotid duplex to evaluate. May reflect radiation from aortic valve intervention but also has significant hx of PAD as well. It does not look like she has seen [facility] in quite some time since 2014. Anticipate recommendation forthcoming to resume follow-up with their office, will also see if any additional management needed on carotid study.  5. OSA - untreated, previously managed by Dr. Brett Fairy whom she does not wish to see again. I offered referral to pulmonology to locally manage here and she declines at this time but I encouraged her to let us know if she wants to proceed.     Disposition: F/u with Dr. Harl Bowie in 6 months. Patient requests to see Dr. Harl Bowie only for any future visits.   Medication Adjustments/Labs and Tests Ordered: Current medicines are reviewed at length with the patient today.  Concerns regarding medicines are outlined above. Medication changes, Labs and Tests ordered today are summarized above and listed in the Patient Instructions accessible in Encounters.    Signed, Charlie Pitter, PA-C  12/15/2021 3:25 PM    [facility] Location in [place]. [place], [place] [address] Ph: [phone]; Fax [phone]

## 2021-12-13 ENCOUNTER — Ambulatory Visit (INDEPENDENT_AMBULATORY_CARE_PROVIDER_SITE_OTHER): Payer: BC Managed Care – PPO | Admitting: Gastroenterology

## 2021-12-13 DIAGNOSIS — K921 Melena: Secondary | ICD-10-CM | POA: Diagnosis not present

## 2021-12-13 DIAGNOSIS — R1032 Left lower quadrant pain: Secondary | ICD-10-CM | POA: Diagnosis not present

## 2021-12-13 DIAGNOSIS — K642 Third degree hemorrhoids: Secondary | ICD-10-CM | POA: Diagnosis not present

## 2021-12-13 LAB — IFOBT (OCCULT BLOOD): IFOBT: NEGATIVE

## 2021-12-13 NOTE — Addendum Note (Signed)
Addended by: Orland Jarred on: 12/13/2021 04:04 PM   Modules accepted: Orders

## 2021-12-14 LAB — CBC WITH DIFFERENTIAL/PLATELET
Basophils Absolute: 0 10*3/uL (ref 0.0–0.2)
Basos: 1 %
EOS (ABSOLUTE): 0.3 10*3/uL (ref 0.0–0.4)
Eos: 3 %
Hematocrit: 41.9 % (ref 34.0–46.6)
Hemoglobin: 13.9 g/dL (ref 11.1–15.9)
Immature Grans (Abs): 0 10*3/uL (ref 0.0–0.1)
Immature Granulocytes: 0 %
Lymphocytes Absolute: 2.6 10*3/uL (ref 0.7–3.1)
Lymphs: 34 %
MCH: 29 pg (ref 26.6–33.0)
MCHC: 33.2 g/dL (ref 31.5–35.7)
MCV: 88 fL (ref 79–97)
Monocytes Absolute: 0.6 10*3/uL (ref 0.1–0.9)
Monocytes: 7 %
Neutrophils Absolute: 4.3 10*3/uL (ref 1.4–7.0)
Neutrophils: 55 %
Platelets: 264 10*3/uL (ref 150–450)
RBC: 4.79 x10E6/uL (ref 3.77–5.28)
RDW: 13.2 % (ref 11.7–15.4)
WBC: 7.8 10*3/uL (ref 3.4–10.8)

## 2021-12-15 ENCOUNTER — Ambulatory Visit: Payer: BC Managed Care – PPO | Admitting: Physician Assistant

## 2021-12-15 ENCOUNTER — Encounter: Payer: Self-pay | Admitting: Physician Assistant

## 2021-12-15 VITALS — BP 116/66 | HR 77 | Ht 65.0 in | Wt 225.0 lb

## 2021-12-15 DIAGNOSIS — Z952 Presence of prosthetic heart valve: Secondary | ICD-10-CM

## 2021-12-15 DIAGNOSIS — I10 Essential (primary) hypertension: Secondary | ICD-10-CM | POA: Diagnosis not present

## 2021-12-15 DIAGNOSIS — I739 Peripheral vascular disease, unspecified: Secondary | ICD-10-CM

## 2021-12-15 DIAGNOSIS — R0989 Other specified symptoms and signs involving the circulatory and respiratory systems: Secondary | ICD-10-CM

## 2021-12-15 DIAGNOSIS — I350 Nonrheumatic aortic (valve) stenosis: Secondary | ICD-10-CM

## 2021-12-15 DIAGNOSIS — E782 Mixed hyperlipidemia: Secondary | ICD-10-CM | POA: Diagnosis not present

## 2021-12-15 DIAGNOSIS — K625 Hemorrhage of anus and rectum: Secondary | ICD-10-CM

## 2021-12-15 DIAGNOSIS — G4733 Obstructive sleep apnea (adult) (pediatric): Secondary | ICD-10-CM

## 2021-12-15 NOTE — Patient Instructions (Signed)
Medication Instructions:  Your physician recommends that you continue on your current medications as directed. Please refer to the Current Medication list given to you today.   Labwork: Fasting Lipid Panel- Nothing to eat or drink at least 8 hours prior to having lab work draqn  Testing/Procedures: Your physician has requested that you have a carotid duplex. This test is an ultrasound of the carotid arteries in your neck. It looks at blood flow through these arteries that supply the brain with blood. Allow one hour for this exam. There are no restrictions or special instructions.  Your physician has requested that you have an echocardiogram. Echocardiography is a painless test that uses sound waves to create images of your heart. It provides your doctor with information about the size and shape of your heart and how well your heart's chambers and valves are working. This procedure takes approximately one hour. There are no restrictions for this procedure.   Follow-Up: Follow up with Dr. Harl Bowie in 6 months.   Any Other Special Instructions Will Be Listed Below (If Applicable).     If you need a refill on your cardiac medications before your next appointment, please call your pharmacy.   Endocarditis Information  You may be at risk for developing endocarditis since you have an artificial heart valve or a repaired heart valve. Endocarditis is an infection of the lining of the heart or heart valves. Certain surgical and dental procedures may put you at risk, such as teeth cleaning or other dental procedures or other medical procedures. Notify our office or your dentist before having any dental work or invasive/surgical procedures. You will need to take antibiotics before certain procedures. To prevent endocarditis, maintain good oral health. Seek prompt medical attention for any mouth/gum, skin or urinary tract infections.

## 2021-12-20 ENCOUNTER — Other Ambulatory Visit (HOSPITAL_COMMUNITY): Payer: BC Managed Care – PPO

## 2021-12-27 NOTE — Telephone Encounter (Signed)
Called and spoke with patient. She had just woken up. Currently no pain. Last episode of bleeding last night. I have asked her to go to the ED if any recurrent pain or bleeding.

## 2021-12-29 LAB — CBC WITH DIFFERENTIAL/PLATELET
Basophils Absolute: 0.1 10*3/uL (ref 0.0–0.2)
Basos: 1 %
EOS (ABSOLUTE): 0.3 10*3/uL (ref 0.0–0.4)
Eos: 3 %
Hematocrit: 38.7 % (ref 34.0–46.6)
Hemoglobin: 12.8 g/dL (ref 11.1–15.9)
Immature Grans (Abs): 0 10*3/uL (ref 0.0–0.1)
Immature Granulocytes: 0 %
Lymphocytes Absolute: 2.8 10*3/uL (ref 0.7–3.1)
Lymphs: 31 %
MCH: 28.8 pg (ref 26.6–33.0)
MCHC: 33.1 g/dL (ref 31.5–35.7)
MCV: 87 fL (ref 79–97)
Monocytes Absolute: 0.8 10*3/uL (ref 0.1–0.9)
Monocytes: 8 %
Neutrophils Absolute: 5.2 10*3/uL (ref 1.4–7.0)
Neutrophils: 57 %
Platelets: 222 10*3/uL (ref 150–450)
RBC: 4.45 x10E6/uL (ref 3.77–5.28)
RDW: 13.1 % (ref 11.7–15.4)
WBC: 9.1 10*3/uL (ref 3.4–10.8)

## 2021-12-29 NOTE — Telephone Encounter (Signed)
I called and spoke to patient. Every time she has a BM, she bleeds. Fills up half the toilet. LLQ pain she has had chronically is no change from baseline.   I have strongly recommended ED evaluation. She declines to present to the ED. She is stating she will do blood work.   Please release orders for [facility] and send to [facility] by the hospital.

## 2021-12-29 NOTE — Addendum Note (Signed)
Addended by: Annitta Needs on: 12/29/2021 03:19 PM   Modules accepted: Orders

## 2022-01-03 ENCOUNTER — Other Ambulatory Visit: Payer: Self-pay | Admitting: Interventional Radiology

## 2022-01-03 DIAGNOSIS — K551 Chronic vascular disorders of intestine: Secondary | ICD-10-CM

## 2022-01-11 ENCOUNTER — Ambulatory Visit
Admission: RE | Admit: 2022-01-11 | Discharge: 2022-01-11 | Disposition: A | Discharge status: Home / Self Care | Payer: BC Managed Care – PPO | Source: Ambulatory Visit | Attending: Interventional Radiology | Admitting: Interventional Radiology

## 2022-01-11 ENCOUNTER — Other Ambulatory Visit: Payer: Self-pay | Admitting: Interventional Radiology

## 2022-01-11 DIAGNOSIS — K551 Chronic vascular disorders of intestine: Secondary | ICD-10-CM

## 2022-01-11 HISTORY — PX: IR RADIOLOGIST EVAL & MGMT: IMG5224

## 2022-01-11 NOTE — Progress Notes (Signed)
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   She is now status post SMA covered balloon-expandable stent placement on 10/29/20.  On follow up CTA in February, her stent was noted to be occluded.  At this time, she was asymptomatic, however between clinic visit and time of procedure she began to experience mild ischemic pain symptoms once again.   On 07/12/21 she underwent successful SMA stent recanalization with laser atherectomy, placement of new 5x39 mm VBX.   At this time, we also did a diagnostic bilateral lower extremity angiogram which revealed a mild stenosis just proximal to the indwelling left external iliac stent, but otherwise widely patent inflow, outflow, and runoff vessels.     She presents today via virtual telephone visit.   She has had gradual onset of vague, peripheral abdominal pain that migrates from the right to the left side.  It is similar, though not nearly as severe, as prior pain when she had an occluded SMA.  She has remained compliant with DAPT.  She has continued smoking cessation.  She reports some recent rectal bleeding that started approximately 1 month ago after a long car trip to [place].  This has significantly improved and now she has occasional spotting on toilet paper when wiping.    She had ABIs done in August which were normal.   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 aortic stenosis, congenital - s/p repair during childhood     Past Surgical History:  Procedure Laterality Date   ABDOMINAL AORTAGRAM  06/24/2012   ABDOMINAL AORTAGRAM N/A 06/24/2012   Procedure: ABDOMINAL Maxcine Ham;  Surgeon: Angelia Mould, MD;  Location: [facility] CATH LAB;  Service: Cardiovascular;  Laterality: N/A;   AORTIC VALVE REPLACEMENT N/A 10/07/2014   Procedure: REDO AORTIC VALVE REPLACEMENT (AVR);  Surgeon: Rexene Alberts, MD;  Location: [place];  Service: Open Heart Surgery;  Laterality: N/A;   ASCENDING AORTIC ROOT REPLACEMENT N/A 10/07/2014   Procedure: ASCENDING AORTIC ROOT REPLACEMENT;  Surgeon: Rexene Alberts, MD;  Location: [place];  Service: Open Heart Surgery;  Laterality: N/A;   BIOPSY  09/27/2016   Procedure: BIOPSY;  Surgeon: Daneil Dolin, MD;  [facility];  Service: Endoscopy;;  colon   BIOPSY  10/18/2020   Procedure: BIOPSY;  Surgeon: Eloise Harman, DO;  [facility];  Service: Endoscopy;;   BREAST REDUCTION SURGERY Bilateral 01/21/2018   Procedure: BREAST REDUCTION WITH LIPOSUCTION;  Surgeon: Cristine Polio, MD;  Location: [place];  Service: Plastics;  Laterality: Bilateral;   CARDIAC  VALVE SURGERY  1968   CARPAL TUNNEL RELEASE Right 03/02/2020   Procedure: CARPAL TUNNEL RELEASE;  Surgeon: Carole Civil, MD;  Location: [facility];  Service: Orthopedics;  Laterality: Right;   CATARACT EXTRACTION W/PHACO Left 05/30/2019   Procedure: CATARACT EXTRACTION PHACO AND INTRAOCULAR LENS PLACEMENT (IOC) (CDE: 4.94  );  Surgeon: Baruch Goldmann, MD;  Location: [facility];  Service: Ophthalmology;  Laterality: Left;    CATARACT EXTRACTION W/PHACO Right 06/16/2019   Procedure: CATARACT EXTRACTION PHACO AND INTRAOCULAR LENS PLACEMENT (IOC);  Surgeon: Baruch Goldmann, MD;  [facility];  Service: Ophthalmology;  Laterality: Right;  CDE: 4.64   CERVICAL FUSION     CHOLECYSTECTOMY     COLONOSCOPY WITH PROPOFOL N/A 09/27/2016   Dr. Gala Romney: Diverticulosis, several tubular adenomas removed ranging 4 to 7 mm in size, internal grade 1 hemorrhoids, terminal ileum normal, segmental biopsies negative for microscopic colitis.  Next colonoscopy June 2021   COLONOSCOPY WITH PROPOFOL N/A 05/06/2020   internal hemorrhoids, sigmoid diverticulosis. Surveillance colonoscopy due in 2027   ESOPHAGOGASTRODUODENOSCOPY (EGD) WITH PROPOFOL N/A 09/27/2016   Dr. Gala Romney: Small hiatal hernia, mild Schatzki ring status post disruption, LA grade a esophagitis   ESOPHAGOGASTRODUODENOSCOPY (EGD) WITH PROPOFOL N/A 10/18/2020   non-obstructing mild Schatzki ring, gastritis s/ biopsy. Negative H.pylori.   ILIAC ARTERY STENT Left 12/2007   IR ANGIOGRAM EXTREMITY BILATERAL  07/12/2021   IR ANGIOGRAM VISCERAL SELECTIVE  10/29/2020   IR ANGIOGRAM VISCERAL SELECTIVE  07/12/2021   IR IVUS EACH ADDITIONAL NON CORONARY VESSEL  07/12/2021   IR RADIOLOGIST EVAL & MGMT  10/26/2020   IR RADIOLOGIST EVAL & MGMT  11/10/2020   IR RADIOLOGIST EVAL & MGMT  02/09/2021   IR RADIOLOGIST EVAL & MGMT  06/16/2021   IR RADIOLOGIST EVAL & MGMT  08/15/2021   IR RADIOLOGIST EVAL & MGMT  10/18/2021   IR THROMBECT SEC MECH MOD SED  07/12/2021   IR TRANSCATH PLC STENT 1ST ART NOT LE CV CAR VERT CAR  10/29/2020   IR TRANSCATH PLC STENT 1ST ART NOT LE CV CAR VERT CAR  07/12/2021   IR US GUIDE VASC ACCESS RIGHT  10/29/2020   IR US GUIDE VASC ACCESS RIGHT  07/12/2021   KNEE ARTHROSCOPY WITH MEDIAL MENISECTOMY Right 01/10/2018   Procedure: RIGHT KNEE ARTHROSCOPY WITH PARTIAL MEDIAL MENISECTOMY;  Surgeon: Carole Civil, MD;  [facility];  Service: Orthopedics;   Laterality: Right;   LEFT AND RIGHT HEART CATHETERIZATION WITH CORONARY ANGIOGRAM N/A 07/31/2014   Procedure: LEFT AND RIGHT HEART CATHETERIZATION WITH CORONARY ANGIOGRAM;  Surgeon: Burnell Blanks, MD;  Location: [facility] C[facility] LAB;  Service: Cardiovascular;  Laterality: N/A;   MALONEY DILATION N/A 09/27/2016   Procedure: Venia Minks DILATION;  Surgeon: Daneil Dolin, MD;  [facility];  Service: Endoscopy;  Laterality: N/A;   POLYPECTOMY  09/27/2016   Procedure: POLYPECTOMY;  Surgeon: Daneil Dolin, MD;  [facility];  Service: Endoscopy;;  colon   ROTATOR CUFF REPAIR Bilateral    TEE WITHOUT CARDIOVERSION N/A 07/07/2014   Procedure: TRANSESOPHAGEAL ECHOCARDIOGRAM (TEE);  Surgeon: Arnoldo Lenis, MD;  [facility];  Service: Cardiology;  Laterality: N/A;   TEE WITHOUT CARDIOVERSION N/A 07/20/2014   Procedure: TRANSESOPHAGEAL ECHOCARDIOGRAM (TEE) WITH PROPOFOL;  Surgeon: Arnoldo Lenis, MD;  [facility];  Service: Endoscopy;  Laterality: N/A;   TEE WITHOUT CARDIOVERSION N/A 10/07/2014   Procedure: TRANSESOPHAGEAL ECHOCARDIOGRAM (TEE);  Surgeon: Rexene Alberts, MD;  Location: [place];  Service: Open Heart Surgery;  Laterality: N/A;    Allergies: Iodinated contrast media, Penicillins, and Sulfa antibiotics  Medications: Prior to Admission medications   Medication Sig Start Date End Date Taking? Authorizing Provider  aspirin EC 81 MG tablet Take 1 tablet (81 mg total) by mouth daily. Swallow whole. 12/16/19   Arnoldo Lenis, MD  Cholecalciferol (DIALYVITE VITAMIN D 5000) 125 MCG (5000 UT) capsule Take 5,000 Units by mouth daily.    [provider]  clopidogrel (PLAVIX) 75 MG tablet Take 1 tablet (75 mg total) by mouth daily. 07/13/21   Theresa Duty, NP  diclofenac (VOLTAREN) 75 MG EC tablet TAKE 1 TABLET TWICE A DAY Patient taking differently: Take 75 mg by mouth 2 (two) times daily. 03/03/21   Carole Civil, MD  dicyclomine  (BENTYL) 10 MG capsule Take 1 capsule (10 mg total) by mouth every 8 (eight) hours as needed for spasms. For left-sided discomfort and frequent stools. Watch for constipation, dry mouth, dizziness. 12/06/21   Annitta Needs, NP  estradiol (ESTRACE) 2 MG tablet Take 1 tablet (2 mg total) by mouth daily. 01/19/21   Wendie Agreste, MD  Flaxseed, Linseed, (FLAX SEED OIL PO) Take 1 capsule by mouth daily.    [provider]  hydrocortisone (ANUSOL-HC) 2.5 % rectal cream Place 1 Application rectally 2 (two) times daily. As needed for rectal bleeding. 12/06/21   Annitta Needs, NP  losartan (COZAAR) 25 MG tablet TAKE 1 TABLET BY MOUTH DAILY Patient taking differently: Take 25 mg by mouth daily. 06/27/21   Wendie Agreste, MD  metoprolol tartrate (LOPRESSOR) 25 MG tablet TAKE 1 TABLET TWICE A DAY Patient taking differently: Take 25 mg by mouth 2 (two) times daily. 03/29/21   Arnoldo Lenis, MD  Multiple Minerals (CALCIUM-MAGNESIUM-ZINC) TABS Take 1 tablet by mouth daily.    [provider]  Multiple Vitamin (MULTIVITAMIN WITH MINERALS) TABS tablet Take 1 tablet by mouth daily.    [provider]  progesterone (PROMETRIUM) 200 MG capsule Take 2 capsules (400 mg total) by mouth daily. 01/19/21 12/06/21  Wendie Agreste, MD  RABEprazole (ACIPHEX) 20 MG tablet TAKE ONE TABLET BY MOUTH TWICE A DAY BEFORE A MEAL Patient taking differently: Take 20 mg by mouth in the morning and at bedtime. 02/09/21   Annitta Needs, NP  rosuvastatin (CRESTOR) 40 MG tablet Take 1 tablet (40 mg total) by mouth daily. 03/25/21 12/15/21  Arnoldo Lenis, MD  triamcinolone ointment (KENALOG) 0.1 % Apply 1 Application topically 2 (two) times daily as needed. 11/25/21   Valentina Shaggy, MD     Family History  Problem Relation Age of Onset   Hypertension Mother    Hypertension Father    Heart disease Father        before age 54   Other Father        varicose veins   COPD Father    Colon cancer Neg  Hx    Celiac disease Neg Hx    Inflammatory bowel disease Neg Hx     Social History   Socioeconomic History   Marital status: Married    Spouse name: Not on file   Number of children: 0   Years of education: some coll   Highest education level: Not on file  Occupational History   Occupation: call center    Employer: [employer]    Comment: [employer]  Tobacco Use   Smoking status: Former    Packs/day: 0.50  Years: 40.00    Total pack years: 20.00    Types: Cigarettes    Quit date: 05/2021    Years since quitting: 0.6   Smokeless tobacco: Never  Vaping Use   Vaping Use: Never used  Substance and Sexual Activity   Alcohol use: Yes    Alcohol/week: 2.0 standard drinks of alcohol    Types: 2 Cans of beer per week   Drug use: No   Sexual activity: Yes    Birth control/protection: None  Other Topics Concern   Not on file  Social History Narrative   Patient is married   for 5 years . Patient works at [employer].. Some college education-did not Writer.Originally from [place].   Social Determinants of Health   Financial Resource Strain: Not on file  Food Insecurity: Not on file  Transportation Needs: Not on file  Physical Activity: Not on file  Stress: Not on file  Social Connections: Not on file     Vital Signs: There were no vitals taken for this visit.  No physical examination was performed in lieu of virtual telephone clinic visit.   Imaging: ABI (06/03/21--> 11/25/21) R = 1.11 --> 1.07 L = 0.93 --> 1.02    CTA AP (06/09/21)  Interval occlusion of SMA stent, with proximal native reconstitution.  Prominent Arc of [place].  Unchanged mild/moderate celiac stenosis, widely patent IMA.     CTA AP (07/15/21)  Recanalized and relined SMA stent, widely patent.   Angiogram (07/12/21)  Mild proximal left EIA stenosis and mild in-stent restenosis.     10/11/21  Patent SMA stent  Labs:  CBC: Recent Labs    07/15/21 1358 10/11/21 1701  12/13/21 1609 12/29/21 1557  WBC 8.6 6.4 7.8 9.1  HGB 11.6* 12.0 13.9 12.8  HCT 34.7* 36.3 41.9 38.7  PLT 219 219 264 222    COAGS: Recent Labs    07/12/21 1107  INR 1.0    BMP: Recent Labs    06/17/21 0743 07/12/21 1107 07/15/21 1358 10/11/21 1553 10/11/21 1701  NA 138 139 136  --  135  K 4.3 4.2 3.9  --  4.3  CL 106 108 104  --  108  CO2 '25 23 24  '$ --  22  GLUCOSE 102* 102* 110*  --  133*  BUN '17 10 14  '$ --  15  CALCIUM 9.6 9.2 8.6*  --  8.1*  CREATININE 0.89 0.82 0.74 0.90 0.92  GFRNONAA  --  >60 >60  --  >60    LIVER FUNCTION TESTS: Recent Labs    06/17/21 0743 07/15/21 1358 10/11/21 1701  BILITOT 0.5 0.3 0.2*  AST '19 18 20  '$ ALT '18 20 16  '$ ALKPHOS  --  49 43  PROT 6.6 6.9 5.6*  ALBUMIN  --  3.6 3.0*    Assessment and Plan: 66 year old female with history of atherosclerotic disease including peripheral artery disease and chronic mesenteric ischemia now status post superior mesenteric artery covered stent placement on 10/29/20 and stent recanalization with re-lining on 07/12/21.  At that time, we also performed bilateral lower extremity angiogram for diagnostic purposes.     She is optimized from a medical standpoint with good control of her blood pressure, hyperlipidemia, and on dual antiplatelet therapy. She has been successful with smoking cessation since February.  Recent ABIs normal, improved from prior.    New onset abdominal pain and some improving rectal bleeding.  I suspect rectal bleeding is related to hemorrhoids.  Abdominal pain is vague and non-specific, but could be indicative of re-stenosis of SMA stent.  -continue aspirin, plavix -continue smoking cessation -plan for mesenteric duplex ASAP - hopefully can schedule with upcoming US carotid on 9/22 at [facility]   Electronically Signed: [place] 01/11/2022, 3:41 PM   I spent a total of 25 Minutes in virtual telephone clinical consultation, greater than 50% of which was counseling/coordinating  care for chronic mesenteric ischemia.

## 2022-01-12 NOTE — Progress Notes (Signed)
[place], [address] [address] Dept: [phone]  FOLLOW UP NOTE  Patient ID: Michelle Patton, female    DOB: October 30, 1955  Age: 66 y.o. MRN: [id number] Date of Office Visit: 01/13/2022  Assessment  Chief Complaint: Food/Drug Challenge (PCN)  HPI Michelle Patton is a 66 year old female who presents to the clinic for an oral penicillin challenge.  She was last seen in this clinic on 11/25/2021 by Dr. Ernst Bowler as a new patient for evaluation of urticaria, penicillin allergy, anaphylactic reaction to contrast dye, and insect bites.  At today's visit, she reports that she is feeling well overall with no cardiopulmonary, gastrointestinal, or integumentary symptoms.  She has not had any antihistamines over the last 3 days.  She does report that in the late 1970s or early 1980s she had penicillin after which she had a reaction that she cannot remember the symptoms.  At that time, she was told to stop taking penicillin due to allergy.  She reports that she did carry an EpiPen for short period of time, however, has not needed to use this at all.  Her current medications are listed in the chart.   Drug Allergies:  Allergies  Allergen Reactions   Iodinated Contrast Media Anaphylaxis, Shortness Of Breath, Itching, Swelling and Other (See Comments)    Patient was given Omni 350 and suffered from itching, chest pain and shortness of breath. Patient was taken to ED after onset of reaction. 10/11/2021  MD zackowski noted pt had tongue and throat swelling, had to give pt epinephrine    Sulfa Antibiotics Nausea And Vomiting    Physical Exam: BP (!) 148/84   Pulse 78   Resp 16   Wt 223 lb 6.4 oz (101.3 kg)   SpO2 97%   BMI 37.18 kg/m    Physical Exam Vitals reviewed.  Constitutional:      Appearance: Normal appearance.  HENT:     Head: Normocephalic and atraumatic.     Right Ear: Tympanic membrane normal.     Left Ear: Tympanic membrane normal.     Nose:     Comments:  Bilateral nares slightly erythematous with no nasal drainage noted.  Pharynx normal.  Ears normal.  Eyes normal.    Mouth/Throat:     Pharynx: Oropharynx is clear.  Eyes:     Conjunctiva/sclera: Conjunctivae normal.  Cardiovascular:     Rate and Rhythm: Normal rate and regular rhythm.     Heart sounds: Normal heart sounds. No murmur heard. Pulmonary:     Effort: Pulmonary effort is normal.     Breath sounds: Normal breath sounds.     Comments: Lungs clear to auscultation Musculoskeletal:        General: Normal range of motion.     Cervical back: Normal range of motion and neck supple.  Skin:    General: Skin is warm and dry.  Neurological:     Mental Status: She is alert and oriented to person, place, and time.  Psychiatric:        Mood and Affect: Mood normal.        Behavior: Behavior normal.        Thought Content: Thought content normal.        Judgment: Judgment normal.      Procedure note: Written consent obtained Open graded penicillin oral challenge: The patient was able to tolerate the challenge today without adverse signs or symptoms. Vital signs were stable throughout the challenge and observation period. She received  multiple doses separated by 30 minutes, each of which was separated by vitals and a brief physical exam. She received the following doses: 1%, 10%, and 89% for a dose of 500 mg penicillin  She was monitored for 60 minutes following the last dose.  Total testing time: 125 minutes  The patient was able to tolerate the open graded oral challenge today without adverse signs or symptoms. Therefore, she has the same risk of systemic reaction associated with the consumption of penicillin as the general population.   Assessment and Plan: 1. Allergy, drug     Patient Instructions  In office oral penicillin challenge Michelle Patton was able to tolerate the penicillin drug challenge today at the office without adverse signs or symptoms of an allergic reaction.  Therefore, she has the same risk of systemic reaction associated with the use of penicillin as the general population.  - Do not give any penicillin or medications containing penicillin for the next 24 hours. - Monitor for allergic symptoms such as rash, wheezing, diarrhea, swelling, and vomiting for the next 24 hours. If severe symptoms occur call 911. For less severe symptoms treat with Benadryl 50 mg every 4 hours and call the clinic.  - Penicillin has been removed from your allergy list  Call the clinic if this treatment plan is not working well for you  Follow up in 6 months or sooner if needed.   Return in about 6 months (around 07/14/2022), or if symptoms worsen or fail to improve.    Thank you for the opportunity to care for this patient.  Please do not hesitate to contact me with questions.  Gareth Morgan, FNP [facility]

## 2022-01-12 NOTE — Patient Instructions (Addendum)
In office oral penicillin challenge Michelle Patton was able to tolerate the penicillin drug challenge today at the office without adverse signs or symptoms of an allergic reaction. Therefore, she has the same risk of systemic reaction associated with the use of penicillin as the general population.  - Do not give any penicillin or medications containing penicillin for the next 24 hours. - Monitor for allergic symptoms such as rash, wheezing, diarrhea, swelling, and vomiting for the next 24 hours. If severe symptoms occur call 911. For less severe symptoms treat with Benadryl 50 mg every 4 hours and call the clinic.  - Penicillin has been removed from your allergy list  Call the clinic if this treatment plan is not working well for you  Follow up in 6 months or sooner if needed.

## 2022-01-13 ENCOUNTER — Ambulatory Visit (HOSPITAL_BASED_OUTPATIENT_CLINIC_OR_DEPARTMENT_OTHER)
Admission: RE | Admit: 2022-01-13 | Discharge: 2022-01-13 | Disposition: A | Discharge status: Home / Self Care | Payer: BC Managed Care – PPO | Source: Ambulatory Visit | Attending: Physician Assistant | Admitting: Physician Assistant

## 2022-01-13 ENCOUNTER — Telehealth: Payer: Self-pay | Admitting: *Deleted

## 2022-01-13 ENCOUNTER — Encounter: Payer: Self-pay | Admitting: Family Medicine

## 2022-01-13 ENCOUNTER — Ambulatory Visit (HOSPITAL_COMMUNITY)
Admission: RE | Admit: 2022-01-13 | Discharge: 2022-01-13 | Disposition: A | Discharge status: Home / Self Care | Payer: BC Managed Care – PPO | Source: Ambulatory Visit | Attending: Physician Assistant | Admitting: Physician Assistant

## 2022-01-13 ENCOUNTER — Other Ambulatory Visit (HOSPITAL_COMMUNITY)
Admission: RE | Admit: 2022-01-13 | Discharge: 2022-01-13 | Disposition: A | Discharge status: Home / Self Care | Payer: BC Managed Care – PPO | Source: Ambulatory Visit | Attending: Physician Assistant | Admitting: Physician Assistant

## 2022-01-13 ENCOUNTER — Ambulatory Visit: Payer: BC Managed Care – PPO | Admitting: Family Medicine

## 2022-01-13 ENCOUNTER — Ambulatory Visit (HOSPITAL_COMMUNITY)
Admission: RE | Admit: 2022-01-13 | Discharge: 2022-01-13 | Disposition: A | Discharge status: Home / Self Care | Payer: BC Managed Care – PPO | Source: Ambulatory Visit | Attending: Interventional Radiology | Admitting: Interventional Radiology

## 2022-01-13 ENCOUNTER — Other Ambulatory Visit: Payer: Self-pay

## 2022-01-13 VITALS — BP 148/84 | HR 78 | Resp 16 | Wt 223.4 lb

## 2022-01-13 DIAGNOSIS — E782 Mixed hyperlipidemia: Secondary | ICD-10-CM

## 2022-01-13 DIAGNOSIS — K551 Chronic vascular disorders of intestine: Secondary | ICD-10-CM | POA: Insufficient documentation

## 2022-01-13 DIAGNOSIS — R0989 Other specified symptoms and signs involving the circulatory and respiratory systems: Secondary | ICD-10-CM | POA: Diagnosis present

## 2022-01-13 DIAGNOSIS — Z952 Presence of prosthetic heart valve: Secondary | ICD-10-CM

## 2022-01-13 DIAGNOSIS — Z889 Allergy status to unspecified drugs, medicaments and biological substances status: Secondary | ICD-10-CM | POA: Diagnosis not present

## 2022-01-13 LAB — LIPID PANEL
Cholesterol: 172 mg/dL (ref 0–200)
HDL: 66 mg/dL (ref >40)
LDL Cholesterol: 75 mg/dL (ref 0–99)
Total CHOL/HDL Ratio: 2.6 RATIO
Triglycerides: 154 mg/dL — ABNORMAL HIGH (ref <150)
VLDL: 31 mg/dL (ref 0–40)

## 2022-01-13 LAB — ECHOCARDIOGRAM COMPLETE
AR max vel: 1.71 cm2
AV Area VTI: 1.92 cm2
AV Area mean vel: 2.09 cm2
AV Mean grad: 5 mmHg
AV Peak grad: 13.2 mmHg
Ao pk vel: 1.82 m/s
Area-P 1/2: 3.12 cm2
S' Lateral: 2.5 cm
Weight: 3574.4 oz

## 2022-01-13 MED ORDER — EZETIMIBE 10 MG PO TABS: 10.0000 mg | ORAL_TABLET | Freq: Every day | ORAL | 3 refills | Status: DC

## 2022-01-13 NOTE — Telephone Encounter (Signed)
-----   Message from Charlie Pitter, [place] sent at 01/13/2022 10:12 AM EDT ----- Please let pt know labs show improvement in LDL and triglyceride from prior but not totally at goal. Med list states rosuvastatin '40mg'$  daily. Would recommend to add ezetimibe '10mg'$  daily with f/u fasting lipid/liver function panel in 3 months. If she does not want to add another medicine and would prefer to work on diet/exercise would repeat fasting lipid only in 3 months for re-eval.

## 2022-01-13 NOTE — Progress Notes (Signed)
*  PRELIMINARY RESULTS* Echocardiogram 2D Echocardiogram has been performed.  Michelle Patton 01/13/2022, 1:46 PM

## 2022-01-13 NOTE — Telephone Encounter (Signed)
Pt notified of the need to have labs done and the addition of Zetia.

## 2022-01-16 ENCOUNTER — Encounter: Payer: Self-pay | Admitting: Family Medicine

## 2022-01-16 ENCOUNTER — Ambulatory Visit: Payer: BC Managed Care – PPO | Admitting: Family Medicine

## 2022-01-16 ENCOUNTER — Telehealth: Payer: Self-pay | Admitting: Cardiology

## 2022-01-16 VITALS — BP 132/70 | HR 79 | Temp 97.9°F | Ht 65.0 in | Wt 228.2 lb

## 2022-01-16 DIAGNOSIS — I10 Essential (primary) hypertension: Secondary | ICD-10-CM

## 2022-01-16 DIAGNOSIS — Z23 Encounter for immunization: Secondary | ICD-10-CM

## 2022-01-16 DIAGNOSIS — Z2911 Encounter for prophylactic immunotherapy for respiratory syncytial virus (RSV): Secondary | ICD-10-CM | POA: Diagnosis not present

## 2022-01-16 DIAGNOSIS — I739 Peripheral vascular disease, unspecified: Secondary | ICD-10-CM | POA: Diagnosis not present

## 2022-01-16 MED ORDER — METOPROLOL TARTRATE 25 MG PO TABS: 25.0000 mg | ORAL_TABLET | Freq: Two times a day (BID) | ORAL | 3 refills | Status: DC

## 2022-01-16 NOTE — Progress Notes (Signed)
Subjective:  Patient ID: Michelle Patton, female    DOB: [birth year]  Age: 66 y.o. MRN: [id number]  CC:  Chief Complaint  Patient presents with   Hypertension    Med follow up    HPI Yarden Manuelito presents for   Hypertension/cardiac With aortic stenosis, regurgitation, status postrepair during childhood with aortic valve replacement 2016, hyperlipidemia, hypertension, CHF, PAD, followed by cardiology.  Dr. Harl Bowie. Moderate OSA but CPAP prohibitive. Lasix as needed for lower extremity edema.  Followed by vascular with history of left external iliac stent for PAD.  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 Serafina Royals.  [facility]. Metoprolol '25mg'$  BID, losartan '25mg'$  QD, no recent need for furosemide.  Labs noted from patient's phone, obtained at [facility] on 8/15.   BP Readings from Last 3 Encounters:  01/16/22 132/70  01/13/22 (!) 148/84  12/15/21 116/66   Lab Results  Component Value Date   CREATININE 0.92 10/11/2021   Hyperlipidemia: Recent visit with cardiology, rosuvastatin 40 mg daily, recommended adding ezetimibe 10 mg daily with repeat lipids and LFTs in 3 months.  Other option of diet/exercise changes with repeat labs in 3 months. Has not started yet.  Lab Results  Component Value Date   CHOL 172 01/13/2022   HDL 66 01/13/2022   LDLCALC 75 01/13/2022   TRIG 154 (H) 01/13/2022   CHOLHDL 2.6 01/13/2022   Lab Results  Component Value Date   ALT 16 10/11/2021   AST 20 10/11/2021   ALKPHOS 43 10/11/2021   BILITOT 0.2 (L) 10/11/2021   Thyroid supplementation, hormone care NP thyroid, Estrace, progesterone prescribed by previous primary care provider, followed by [facility] MD. Still followed by them every 6-8 weeks. Monitoring levels there. Taken off all meds except estradiol and progesterone.  Off NP thyroid. Plan on checking labs of NP thyroid  and some of the other meds.   HM: RSV vaccine requested today.  UTD on pneumonia vaccine.  Flu vaccine today.   Immunization History  Administered Date(s) Administered   Influenza,inj,Quad PF,6+ Mos 01/02/2019, 02/17/2020, 01/16/2022   Influenza-Unspecified 02/20/2021   Moderna SARS-COV2 Booster Vaccination 09/02/2020   Moderna Sars-Covid-2 Vaccination 07/12/2019, 08/12/2019, 02/20/2020, 01/07/2021   Pneumococcal Conjugate-13 05/25/2017   Pneumococcal Polysaccharide-23 01/09/2019, 01/09/2019   Respiratory Syncytial Virus Vaccine,Recomb Aduvanted(Arexvy) 01/16/2022      History Patient Active Problem List   Diagnosis Date Noted   Allergy, drug 01/13/2022   Melena 12/06/2021   Prolapsed internal hemorrhoids, grade 3 12/06/2021   Left lower quadrant abdominal pain 12/06/2021   Acute anaphylaxis 10/11/2021   Mesenteric ischemia (HCC) 07/12/2021   Chronic mesenteric ischemia (HCC) 02/22/2021   Elevated LFTs 10/05/2020   Class 2 severe obesity due to excess calories with serious comorbidity and body mass index (BMI) of 36.0 to 36.9 in adult [facility]) 08/09/2020   PAD (peripheral artery disease) ([place]) 08/09/2020   Aortic valve prosthesis present 67/34/1937   Chronic systolic congestive heart failure ([place]) 08/09/2020   OSA and COPD overlap syndrome ([place]) 08/09/2020   s/p right carpal tunnel release 03/02/20 04/06/2020   Closed displaced fracture of proximal phalanx of lesser toe of right foot 12/31/2019   Carpal tunnel syndrome of right wrist 12/05/2019   Nondisplaced fracture of fifth right metatarsal bone with routine healing 10/21/2019   Skin lesion 04/11/2019   Low back pain without sciatica 01/20/2019   Pain of upper abdomen 01/14/2019   Fatigue  12/09/2018   Breast cancer screening 12/09/2018   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 left wrist 10/23/2018   S/P right knee arthroscopy 01/10/18 01/18/2018   Chondromalacia, patella, right    Chondromalacia  of medial condyle of right femur    Personal history of colonic polyps 12/28/2016   Abdominal pain, epigastric 08/10/2016   Left sided abdominal pain 08/10/2016   Esophageal dysphagia 08/10/2016   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   Supravalvular aortic stenosis, congenital - s/p repair during childhood    Essential hypertension    GERD (gastroesophageal reflux disease)    Aortic stenosis, severe 07/20/2014   Aortic regurgitation 07/20/2014   Leg cramps 09/26/2012   Occlusion and stenosis of carotid artery without mention of cerebral infarction 07/10/2012   Peripheral vascular disease, unspecified ([place]) 06/12/2012   Carotid artery bruit 06/12/2012   CONSTIPATION 12/28/2009   NAUSEA AND VOMITING 12/28/2009   DIARRHEA 12/28/2009   Past Medical History:  Diagnosis Date   Aortic regurgitation 07/20/2014   Aortic stenosis, severe 07/20/2014   Arthritis    DVT (deep venous thrombosis) (HCC)    Family history of adverse reaction to anesthesia    Reports father deliurm in his 58's with CABG   GERD (gastroesophageal reflux disease)    History of pneumonia    Hyperlipidemia    Hypertension    Insomnia, unspecified    Muscle weakness (generalized)    Peripheral artery disease (HCC)    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   Redo sternotomy for 21 mm Medtronic Freestyle porcine aortic root graft w/ reimplantation of left main and right coronary arteries   Sleep apnea    diagnosed multiple years ago at [facility] aortic stenosis, congenital - s/p repair during childhood    Past Surgical History:  Procedure Laterality Date   ABDOMINAL AORTAGRAM  06/24/2012   ABDOMINAL AORTAGRAM N/A 06/24/2012   Procedure: ABDOMINAL Maxcine Ham;  Surgeon: Angelia Mould, MD;  Location: [facility] CATH LAB;  Service: Cardiovascular;  Laterality: N/A;   AORTIC VALVE REPLACEMENT N/A 10/07/2014    Procedure: REDO AORTIC VALVE REPLACEMENT (AVR);  Surgeon: Rexene Alberts, MD;  Location: [place];  Service: Open Heart Surgery;  Laterality: N/A;   ASCENDING AORTIC ROOT REPLACEMENT N/A 10/07/2014   Procedure: ASCENDING AORTIC ROOT REPLACEMENT;  Surgeon: Rexene Alberts, MD;  Location: [place];  Service: Open Heart Surgery;  Laterality: N/A;   BIOPSY  09/27/2016   Procedure: BIOPSY;  Surgeon: Daneil Dolin, MD;  [facility];  Service: Endoscopy;;  colon   BIOPSY  10/18/2020   Procedure: BIOPSY;  Surgeon: Eloise Harman, DO;  [facility];  Service: Endoscopy;;   BREAST REDUCTION SURGERY Bilateral 01/21/2018   Procedure: BREAST REDUCTION WITH LIPOSUCTION;  Surgeon: Cristine Polio, MD;  Location: [place];  Service: Plastics;  Laterality: Bilateral;   CARDIAC VALVE SURGERY  1968   CARPAL TUNNEL RELEASE Right 03/02/2020   Procedure: CARPAL TUNNEL RELEASE;  Surgeon: Carole Civil, MD;  Location: [facility];  Service: Orthopedics;  Laterality: Right;   CATARACT EXTRACTION W/PHACO Left 05/30/2019   Procedure: CATARACT EXTRACTION PHACO AND INTRAOCULAR LENS PLACEMENT (IOC) (CDE: 4.94  );  Surgeon: Baruch Goldmann, MD;  Location: [facility];  Service: Ophthalmology;  Laterality: Left;   CATARACT EXTRACTION W/PHACO Right 06/16/2019   Procedure: CATARACT EXTRACTION PHACO AND INTRAOCULAR LENS PLACEMENT (IOC);  Surgeon:  Baruch Goldmann, MD;  Location: [facility];  Service: Ophthalmology;  Laterality: Right;  CDE: 4.64   CERVICAL FUSION     CHOLECYSTECTOMY     COLONOSCOPY WITH PROPOFOL N/A 09/27/2016   Dr. Gala Romney: Diverticulosis, several tubular adenomas removed ranging 4 to 7 mm in size, internal grade 1 hemorrhoids, terminal ileum normal, segmental biopsies negative for microscopic colitis.  Next colonoscopy June 2021   COLONOSCOPY WITH PROPOFOL N/A 05/06/2020   internal hemorrhoids, sigmoid diverticulosis. Surveillance colonoscopy due in 2027    ESOPHAGOGASTRODUODENOSCOPY (EGD) WITH PROPOFOL N/A 09/27/2016   Dr. Gala Romney: Small hiatal hernia, mild Schatzki ring status post disruption, LA grade a esophagitis   ESOPHAGOGASTRODUODENOSCOPY (EGD) WITH PROPOFOL N/A 10/18/2020   non-obstructing mild Schatzki ring, gastritis s/ biopsy. Negative H.pylori.   ILIAC ARTERY STENT Left 12/2007   IR ANGIOGRAM EXTREMITY BILATERAL  07/12/2021   IR ANGIOGRAM VISCERAL SELECTIVE  10/29/2020   IR ANGIOGRAM VISCERAL SELECTIVE  07/12/2021   IR IVUS EACH ADDITIONAL NON CORONARY VESSEL  07/12/2021   IR RADIOLOGIST EVAL & MGMT  10/26/2020   IR RADIOLOGIST EVAL & MGMT  11/10/2020   IR RADIOLOGIST EVAL & MGMT  02/09/2021   IR RADIOLOGIST EVAL & MGMT  06/16/2021   IR RADIOLOGIST EVAL & MGMT  08/15/2021   IR RADIOLOGIST EVAL & MGMT  10/18/2021   IR RADIOLOGIST EVAL & MGMT  01/11/2022   IR THROMBECT SEC MECH MOD SED  07/12/2021   IR TRANSCATH PLC STENT 1ST ART NOT LE CV CAR VERT CAR  10/29/2020   IR TRANSCATH PLC STENT 1ST ART NOT LE CV CAR VERT CAR  07/12/2021   IR US GUIDE VASC ACCESS RIGHT  10/29/2020   IR US GUIDE VASC ACCESS RIGHT  07/12/2021   KNEE ARTHROSCOPY WITH MEDIAL MENISECTOMY Right 01/10/2018   Procedure: RIGHT KNEE ARTHROSCOPY WITH PARTIAL MEDIAL MENISECTOMY;  Surgeon: Carole Civil, MD;  [facility];  Service: Orthopedics;  Laterality: Right;   LEFT AND RIGHT HEART CATHETERIZATION WITH CORONARY ANGIOGRAM N/A 07/31/2014   Procedure: LEFT AND RIGHT HEART CATHETERIZATION WITH CORONARY ANGIOGRAM;  Surgeon: Burnell Blanks, MD;  Location: [facility] CATH LAB;  Service: Cardiovascular;  Laterality: N/A;   MALONEY DILATION N/A 09/27/2016   Procedure: Venia Minks DILATION;  Surgeon: Daneil Dolin, MD;  [facility];  Service: Endoscopy;  Laterality: N/A;   POLYPECTOMY  09/27/2016   Procedure: POLYPECTOMY;  Surgeon: Daneil Dolin, MD;  [facility];  Service: Endoscopy;;  colon   ROTATOR CUFF REPAIR Bilateral    TEE WITHOUT  CARDIOVERSION N/A 07/07/2014   Procedure: TRANSESOPHAGEAL ECHOCARDIOGRAM (TEE);  Surgeon: Arnoldo Lenis, MD;  [facility];  Service: Cardiology;  Laterality: N/A;   TEE WITHOUT CARDIOVERSION N/A 07/20/2014   Procedure: TRANSESOPHAGEAL ECHOCARDIOGRAM (TEE) WITH PROPOFOL;  Surgeon: Arnoldo Lenis, MD;  Location: [facility];  Service: Endoscopy;  Laterality: N/A;   TEE WITHOUT CARDIOVERSION N/A 10/07/2014   Procedure: TRANSESOPHAGEAL ECHOCARDIOGRAM (TEE);  Surgeon: Rexene Alberts, MD;  Location: [place];  Service: Open Heart Surgery;  Laterality: N/A;   Allergies  Allergen Reactions   Iodinated Contrast Media Anaphylaxis, Shortness Of Breath, Itching, Swelling and Other (See Comments)    Patient was given Omni 350 and suffered from itching, chest pain and shortness of breath. Patient was taken to ED after onset of reaction. 10/11/2021  MD zackowski noted pt had tongue and throat swelling, had to give pt epinephrine    Sulfa Antibiotics Nausea And Vomiting  Prior to Admission medications   Medication Sig Start Date End Date Taking? Authorizing Provider  aspirin EC 81 MG tablet Take 1 tablet (81 mg total) by mouth daily. Swallow whole. 12/16/19  Yes Branch, Alphonse Guild, MD  Cholecalciferol (DIALYVITE VITAMIN D 5000) 125 MCG (5000 UT) capsule Take 5,000 Units by mouth daily.   Yes [provider]  clopidogrel (PLAVIX) 75 MG tablet Take 1 tablet (75 mg total) by mouth daily. 07/13/21  Yes Covington, Roselyn Reef R, NP  diclofenac (VOLTAREN) 75 MG EC tablet TAKE 1 TABLET TWICE A DAY Patient taking differently: Take 75 mg by mouth 2 (two) times daily. 03/03/21  Yes Carole Civil, MD  dicyclomine (BENTYL) 10 MG capsule Take 1 capsule (10 mg total) by mouth every 8 (eight) hours as needed for spasms. For left-sided discomfort and frequent stools. Watch for constipation, dry mouth, dizziness. 12/06/21  Yes Annitta Needs, NP  estradiol (ESTRACE) 2 MG tablet Take 1 tablet (2 mg total) by  mouth daily. 01/19/21  Yes Wendie Agreste, MD  ezetimibe (ZETIA) 10 MG tablet Take 1 tablet (10 mg total) by mouth daily. 01/13/22 01/08/23 Yes Dunn, Dayna N, PA-C  Flaxseed, Linseed, (FLAX SEED OIL PO) Take 1 capsule by mouth daily.   Yes [provider]  hydrocortisone (ANUSOL-HC) 2.5 % rectal cream Place 1 Application rectally 2 (two) times daily. As needed for rectal bleeding. 12/06/21  Yes Annitta Needs, NP  losartan (COZAAR) 25 MG tablet TAKE 1 TABLET BY MOUTH DAILY Patient taking differently: Take 25 mg by mouth daily. 06/27/21  Yes Wendie Agreste, MD  metoprolol tartrate (LOPRESSOR) 25 MG tablet TAKE 1 TABLET TWICE A DAY Patient taking differently: Take 25 mg by mouth 2 (two) times daily. 03/29/21  Yes BranchAlphonse Guild, MD  Multiple Minerals (CALCIUM-MAGNESIUM-ZINC) TABS Take 1 tablet by mouth daily.   Yes [provider]  Multiple Vitamin (MULTIVITAMIN WITH MINERALS) TABS tablet Take 1 tablet by mouth daily.   Yes [provider]  NP THYROID 120 MG tablet Take 120 mg by mouth every morning. 01/11/22  Yes [provider]  RABEprazole (ACIPHEX) 20 MG tablet TAKE ONE TABLET BY MOUTH TWICE A DAY BEFORE A MEAL Patient taking differently: Take 20 mg by mouth in the morning and at bedtime. 02/09/21  Yes Annitta Needs, NP  triamcinolone ointment (KENALOG) 0.1 % Apply 1 Application topically 2 (two) times daily as needed. 11/25/21  Yes Valentina Shaggy, MD  progesterone (PROMETRIUM) 200 MG capsule Take 2 capsules (400 mg total) by mouth daily. 01/19/21 12/06/21  Wendie Agreste, MD  rosuvastatin (CRESTOR) 40 MG tablet Take 1 tablet (40 mg total) by mouth daily. 03/25/21 12/15/21  Arnoldo Lenis, MD   Social History   Socioeconomic History   Marital status: Married    Spouse name: Not on file   Number of children: 0   Years of education: some coll   Highest education level: Not on file  Occupational History   Occupation: call center    Employer: [employer]     Comment: [employer]  Tobacco Use   Smoking status: Former    Packs/day: 0.50    Years: 40.00    Total pack years: 20.00    Types: Cigarettes    Quit date: 05/2021    Years since quitting: 0.6   Smokeless tobacco: Never  Vaping Use   Vaping Use: Never used  Substance and Sexual Activity   Alcohol use: Yes  Alcohol/week: 2.0 standard drinks of alcohol    Types: 2 Cans of beer per week   Drug use: No   Sexual activity: Yes    Birth control/protection: None  Other Topics Concern   Not on file  Social History Narrative   Patient is married   for 5 years . Patient works at [employer].. Some college education-did not Writer.Originally from [place].   Social Determinants of Health   Financial Resource Strain: Not on file  Food Insecurity: Not on file  Transportation Needs: Not on file  Physical Activity: Not on file  Stress: Not on file  Social Connections: Not on file  Intimate Partner Violence: Not on file    Review of Systems  Per HPI.  Objective:   Vitals:   01/16/22 1542  BP: 132/70  Pulse: 79  Temp: 97.9 F (36.6 C)  SpO2: 96%  Weight: 228 lb 3.2 oz (103.5 kg)  Height: '5\' 5"'$  (1.651 m)     Physical Exam Vitals reviewed.  Constitutional:      Appearance: Normal appearance. She is well-developed.  HENT:     Head: Normocephalic and atraumatic.  Eyes:     Conjunctiva/sclera: Conjunctivae normal.     Pupils: Pupils are equal, round, and reactive to light.  Neck:     Vascular: No carotid bruit.  Cardiovascular:     Rate and Rhythm: Normal rate and regular rhythm.     Heart sounds: Murmur (2/6 systolic.) heard.  Pulmonary:     Effort: Pulmonary effort is normal.     Breath sounds: Normal breath sounds.  Abdominal:     Palpations: Abdomen is soft. There is no pulsatile mass.     Tenderness: There is no abdominal tenderness.  Musculoskeletal:     Right lower leg: No edema.     Left lower leg: No edema.  Skin:    General: Skin is  warm and dry.  Neurological:     Mental Status: She is alert and oriented to person, place, and time.  Psychiatric:        Mood and Affect: Mood normal.        Behavior: Behavior normal.        Assessment & Plan:  Aluel Schwarz is a 66 y.o. female . Essential hypertension  -Recent labs with other provider, asked that she send a screenshot or copy.  Hold on new blood work at this time.  Recent eval by cardiology as above.  BP stable in office, no med changes at this time.  Need for influenza vaccination - Plan: Flu Vaccine QUAD 6+ mos PF IM (Fluarix Quad PF)  PAD (peripheral artery disease) ([place])  -With hyperlipidemia, cardiac disease as above.  Recently added Zetia, asked that she discuss any questions regarding that medication with cardiology, plan for repeat labs next few months.   Need for RSV vaccination - Plan: RSV,Recombinant PF (Arexvy) Given her cardiac history, RSV vaccine given.  Potential side effects discussed.  Meds ordered this encounter  Medications   metoprolol tartrate (LOPRESSOR) 25 MG tablet    Sig: Take 1 tablet (25 mg total) by mouth 2 (two) times daily.    Dispense:  180 tablet    Refill:  3   Patient Instructions  If there are questions about the zetia, would recommend discussing with cardiology. Either way labs were ordered to repeat in 3 months.  Send me a screenshot of recent labs, and new labs when you get them.  Keep follow  up with specialists as scheduled.  Blood pressure looks good today. No changes in meds at this time. Hang in there!         Signed,   Merri Ray, MD [place] 01/20/22 3:26 PM

## 2022-01-16 NOTE — Patient Instructions (Addendum)
If there are questions about the zetia, would recommend discussing with cardiology. Either way labs were ordered to repeat in 3 months.  Send me a screenshot of recent labs, and new labs when you get them.  Keep follow up with specialists as scheduled.  Blood pressure looks good today. No changes in meds at this time. Hang in there!

## 2022-01-16 NOTE — Telephone Encounter (Signed)
-----   Message from Charlie Pitter, [place] sent at 01/13/2022  3:53 PM EDT ----- Please let patient know carotid study showed only minimal plaque which is great. Regarding vascular disease, in the past she followed with [facility] for her LE stenting. Since we do not follow this for her, would suggest to schedule follow-up with them if she is willing. If she wants to see one of our PV physicians in [place] to follow, OK to refer.

## 2022-01-16 NOTE — Telephone Encounter (Signed)
Spoke to pt who verbalized understanding. Pt had no questions or concerns at this time.  

## 2022-01-16 NOTE — Telephone Encounter (Signed)
Pt returning call regarding results. Call transferred

## 2022-01-16 NOTE — Telephone Encounter (Signed)
-----   Message from Charlie Pitter, [place] sent at 01/16/2022  8:01 AM EDT ----- Juluis Rainier also has carotid US result, see result note sent 9/22. Please let patient know echo reassuring. Normal pump function. Mild findings include mild elevation in pressures in lungs, mild enlargement of right atrium of heart, mild leaking of mitral valve. (The elevated pressure in lungs would be consistent with her sleep apnea.) No major change from prior. F/u 6 months as previously recommended.

## 2022-01-18 ENCOUNTER — Encounter: Payer: Self-pay | Admitting: Family Medicine

## 2022-01-18 DIAGNOSIS — E785 Hyperlipidemia, unspecified: Secondary | ICD-10-CM

## 2022-01-18 DIAGNOSIS — I10 Essential (primary) hypertension: Secondary | ICD-10-CM

## 2022-01-20 ENCOUNTER — Encounter: Payer: Self-pay | Admitting: Family Medicine

## 2022-01-23 ENCOUNTER — Other Ambulatory Visit: Payer: Self-pay | Admitting: Cardiology

## 2022-01-23 NOTE — Telephone Encounter (Signed)
Pt attached several labs from [facility] for your review however she does note these are likely not what you are looking for, do I need to send out a request?

## 2022-01-26 ENCOUNTER — Encounter: Payer: Self-pay | Admitting: Family Medicine

## 2022-01-26 ENCOUNTER — Telehealth: Payer: Medicare Other | Admitting: Family Medicine

## 2022-01-26 VITALS — Temp 98.0°F | Wt 223.0 lb

## 2022-01-26 DIAGNOSIS — U071 COVID-19: Secondary | ICD-10-CM

## 2022-01-26 MED ORDER — BENZONATATE 100 MG PO CAPS: ORAL_CAPSULE | ORAL | 0 refills | Status: DC

## 2022-01-26 MED ORDER — MOLNUPIRAVIR EUA 200MG CAPSULE: 4.0000 | ORAL_CAPSULE | Freq: Two times a day (BID) | ORAL | 0 refills | Status: AC

## 2022-01-26 NOTE — Progress Notes (Addendum)
Virtual Visit via Video Note  I connected with Michelle Patton  on 01/26/22 at 10:40 AM EDT by a video enabled telemedicine application and verified that I am speaking with the correct person using two identifiers.  Location patient: [place] Location provider:work or home office Persons participating in the virtual visit: patient, provider  I discussed the limitations and requested verbal permission for telemedicine visit. The patient expressed understanding and agreed to proceed.   HPI:  Acute telemedicine visit for Covid19: -Onset: 2 days, tested positive for covid -Symptoms include: felt like was getting a cold and now worse, SOB, HA, sore throat, congestion, cough, fatigue, has some chest tightness at times - improved -Denies: denies difficulty breathing, diarrhea, vomiting, chest discomfort today, fever -drinking fluids, able to get out bed -Has tried:tylenol -Pertinent past medical history: see below, has not had covid before, GFR > 60 on labs 3 months ago -multiple heart issues, stents and heart surgeries in the past, me -Pertinent medication allergies: Allergies  Allergen Reactions   Iodinated Contrast Media Anaphylaxis, Shortness Of Breath, Itching, Swelling and Other (See Comments)    Patient was given Omni 350 and suffered from itching, chest pain and shortness of breath. Patient was taken to ED after onset of reaction. 10/11/2021  MD zackowski noted pt had tongue and throat swelling, had to give pt epinephrine    Sulfa Antibiotics Nausea And Vomiting  -COVID-19 vaccine status: has had many boosters and had recent booster a few days ago Immunization History  Administered Date(s) Administered   Influenza,inj,Quad PF,6+ Mos 01/02/2019, 02/17/2020, 01/16/2022   Influenza-Unspecified 02/20/2021   Moderna SARS-COV2 Booster Vaccination 09/02/2020   Moderna Sars-Covid-2 Vaccination 07/12/2019, 08/12/2019, 02/20/2020, 01/07/2021   Pneumococcal Conjugate-13 05/25/2017   Pneumococcal  Polysaccharide-23 01/09/2019, 01/09/2019   Respiratory Syncytial Virus Vaccine,Recomb Aduvanted(Arexvy) 01/16/2022     ROS: See pertinent positives and negatives per HPI.  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   Redo sternotomy for 21 mm Medtronic Freestyle porcine aortic root graft w/ reimplantation of left main and right coronary arteries   Sleep apnea    diagnosed multiple years ago at [facility] aortic stenosis, congenital - s/p repair during childhood     Past Surgical History:  Procedure Laterality Date   ABDOMINAL AORTAGRAM  06/24/2012   ABDOMINAL AORTAGRAM N/A 06/24/2012   Procedure: ABDOMINAL Maxcine Ham;  Surgeon: Angelia Mould, MD;  Location: [facility]ATH [facility];  Service: Cardiovascular;  Laterality: N/A;   AORTIC VALVE REPLACEMENT N/A 10/07/2014   Procedure: REDO AORTIC VALVE REPLACEMENT (AVR);  Surgeon: Rexene Alberts, MD;  Location: [place];  Service: Open Heart Surgery;  Laterality: N/A;   ASCENDING AORTIC ROOT REPLACEMENT N/A 10/07/2014   Procedure: ASCENDING AORTIC ROOT REPLACEMENT;  Surgeon: Rexene Alberts, MD;  Location: [place];  Service: Open Heart Surgery;  Laterality: N/A;   BIOPSY  09/27/2016   Procedure: BIOPSY;  Surgeon: Daneil Dolin, MD;  [facility];  Service: Endoscopy;;  colon   BIOPSY  10/18/2020   Procedure: BIOPSY;  Surgeon: Eloise Harman, DO;  [facility];  Service: Endoscopy;;   BREAST REDUCTION SURGERY  Bilateral 01/21/2018   Procedure: BREAST REDUCTION WITH LIPOSUCTION;  Surgeon: Cristine Polio, MD;  Location: [place];  Service: Plastics;  Laterality: Bilateral;   CARDIAC VALVE SURGERY  1968   CARPAL TUNNEL RELEASE Right 03/02/2020   Procedure: CARPAL TUNNEL RELEASE;  Surgeon: Carole Civil, MD;  Location: [facility];  Service: Orthopedics;  Laterality: Right;   CATARACT EXTRACTION W/PHACO Left 05/30/2019   Procedure: CATARACT EXTRACTION PHACO AND INTRAOCULAR LENS PLACEMENT (IOC) (CDE: 4.94  );  Surgeon: Baruch Goldmann, MD;  Location: [facility];  Service: Ophthalmology;  Laterality: Left;   CATARACT EXTRACTION W/PHACO Right 06/16/2019   Procedure: CATARACT EXTRACTION PHACO AND INTRAOCULAR LENS PLACEMENT (IOC);  Surgeon: Baruch Goldmann, MD;  Location: [facility];  Service: Ophthalmology;  Laterality: Right;  CDE: 4.64   CERVICAL FUSION     CHOLECYSTECTOMY     COLONOSCOPY WITH PROPOFOL N/A 09/27/2016   Dr. Gala Romney: Diverticulosis, several tubular adenomas removed ranging 4 to 7 mm in size, internal grade 1 hemorrhoids, terminal ileum normal, segmental biopsies negative for microscopic colitis.  Next colonoscopy June 2021   COLONOSCOPY WITH PROPOFOL N/A 05/06/2020   internal hemorrhoids, sigmoid diverticulosis. Surveillance colonoscopy due in 2027   ESOPHAGOGASTRODUODENOSCOPY (EGD) WITH PROPOFOL N/A 09/27/2016   Dr. Gala Romney: Small hiatal hernia, mild Schatzki ring status post disruption, LA grade a esophagitis   ESOPHAGOGASTRODUODENOSCOPY (EGD) WITH PROPOFOL N/A 10/18/2020   non-obstructing mild Schatzki ring, gastritis s/ biopsy. Negative H.pylori.   ILIAC ARTERY STENT Left 12/2007   IR ANGIOGRAM EXTREMITY BILATERAL  07/12/2021   IR ANGIOGRAM VISCERAL SELECTIVE  10/29/2020   IR ANGIOGRAM VISCERAL SELECTIVE  07/12/2021   IR IVUS EACH ADDITIONAL NON CORONARY VESSEL  07/12/2021   IR RADIOLOGIST EVAL & MGMT  10/26/2020   IR RADIOLOGIST EVAL & MGMT  11/10/2020   IR RADIOLOGIST EVAL &  MGMT  02/09/2021   IR RADIOLOGIST EVAL & MGMT  06/16/2021   IR RADIOLOGIST EVAL & MGMT  08/15/2021   IR RADIOLOGIST EVAL & MGMT  10/18/2021   IR RADIOLOGIST EVAL & MGMT  01/11/2022   IR THROMBECT SEC MECH MOD SED  07/12/2021   IR TRANSCATH PLC STENT 1ST ART NOT LE CV CAR VERT CAR  10/29/2020   IR TRANSCATH PLC STENT 1ST ART NOT LE CV CAR VERT CAR  07/12/2021   IR US GUIDE VASC ACCESS RIGHT  10/29/2020   IR US GUIDE VASC ACCESS RIGHT  07/12/2021   KNEE ARTHROSCOPY WITH MEDIAL MENISECTOMY Right 01/10/2018   Procedure: RIGHT KNEE ARTHROSCOPY WITH PARTIAL MEDIAL MENISECTOMY;  Surgeon: Carole Civil, MD;  [facility];  Service: Orthopedics;  Laterality: Right;   LEFT AND RIGHT HEART CATHETERIZATION WITH CORONARY ANGIOGRAM N/A 07/31/2014   Procedure: LEFT AND RIGHT HEART CATHETERIZATION WITH CORONARY ANGIOGRAM;  Surgeon: Burnell Blanks, MD;  Location[facility] C[facility];  Service: Cardiovascular;  Laterality: N/A;   MALONEY DILATION N/A 09/27/2016   Procedure: Venia Minks DILATION;  Surgeon: Daneil Dolin, MD;  [facility];  Service: Endoscopy;  Laterality: N/A;   POLYPECTOMY  09/27/2016   Procedure: POLYPECTOMY;  Surgeon: Daneil Dolin, MD;  [facility];  Service: Endoscopy;;  colon   ROTATOR CUFF REPAIR Bilateral    TEE WITHOUT CARDIOVERSION N/A 07/07/2014   Procedure: TRANSESOPHAGEAL ECHOCARDIOGRAM (TEE);  Surgeon:  Arnoldo Lenis, MD;  [facility];  Service: Cardiology;  Laterality: N/A;   TEE WITHOUT CARDIOVERSION N/A 07/20/2014   Procedure: TRANSESOPHAGEAL ECHOCARDIOGRAM (TEE) WITH PROPOFOL;  Surgeon: Arnoldo Lenis, MD;  [facility];  Service: Endoscopy;  Laterality: N/A;   TEE WITHOUT CARDIOVERSION N/A 10/07/2014   Procedure: TRANSESOPHAGEAL ECHOCARDIOGRAM (TEE);  Surgeon: Rexene Alberts, MD;  Location: [place];  Service: Open Heart Surgery;  Laterality: N/A;     Current Outpatient Medications:    aspirin EC 81 MG tablet, Take 1 tablet  (81 mg total) by mouth daily. Swallow whole., Disp: 90 tablet, Rfl: 3   benzonatate (TESSALON PERLES) 100 MG capsule, 1-2 capsules up to twice daily as needed for cough., Disp: 30 capsule, Rfl: 0   Cholecalciferol (DIALYVITE VITAMIN D 5000) 125 MCG (5000 UT) capsule, Take 5,000 Units by mouth daily., Disp: , Rfl:    clopidogrel (PLAVIX) 75 MG tablet, Take 1 tablet (75 mg total) by mouth daily., Disp: 90 tablet, Rfl: 3   diclofenac (VOLTAREN) 75 MG EC tablet, TAKE 1 TABLET TWICE A DAY (Patient taking differently: Take 75 mg by mouth 2 (two) times daily.), Disp: 60 tablet, Rfl: 11   dicyclomine (BENTYL) 10 MG capsule, Take 1 capsule (10 mg total) by mouth every 8 (eight) hours as needed for spasms. For left-sided discomfort and frequent stools. Watch for constipation, dry mouth, dizziness., Disp: 90 capsule, Rfl: 1   estradiol (ESTRACE) 2 MG tablet, Take 1 tablet (2 mg total) by mouth daily., Disp: 30 tablet, Rfl: 3   ezetimibe (ZETIA) 10 MG tablet, Take 1 tablet (10 mg total) by mouth daily., Disp: 90 tablet, Rfl: 3   Flaxseed, Linseed, (FLAX SEED OIL PO), Take 1 capsule by mouth daily., Disp: , Rfl:    hydrocortisone (ANUSOL-HC) 2.5 % rectal cream, Place 1 Application rectally 2 (two) times daily. As needed for rectal bleeding., Disp: 30 g, Rfl: 1   losartan (COZAAR) 25 MG tablet, TAKE 1 TABLET BY MOUTH DAILY (Patient taking differently: Take 25 mg by mouth daily.), Disp: 90 tablet, Rfl: 3   metoprolol tartrate (LOPRESSOR) 25 MG tablet, Take 1 tablet (25 mg total) by mouth 2 (two) times daily., Disp: 180 tablet, Rfl: 3   molnupiravir EUA (LAGEVRIO) 200 mg CAPS capsule, Take 4 capsules (800 mg total) by mouth 2 (two) times daily for 5 days., Disp: 40 capsule, Rfl: 0   Multiple Minerals (CALCIUM-MAGNESIUM-ZINC) TABS, Take 1 tablet by mouth daily., Disp: , Rfl:    Multiple Vitamin (MULTIVITAMIN WITH MINERALS) TABS tablet, Take 1 tablet by mouth daily., Disp: , Rfl:    NP THYROID 120 MG tablet, Take 120  mg by mouth every morning., Disp: , Rfl:    RABEprazole (ACIPHEX) 20 MG tablet, TAKE ONE TABLET BY MOUTH TWICE A DAY BEFORE A MEAL (Patient taking differently: Take 20 mg by mouth in the morning and at bedtime.), Disp: 180 tablet, Rfl: 3   rosuvastatin (CRESTOR) 40 MG tablet, TAKE 1 TABLET DAILY, Disp: 90 tablet, Rfl: 3   triamcinolone ointment (KENALOG) 0.1 %, Apply 1 Application topically 2 (two) times daily as needed., Disp: 454 g, Rfl: 0   progesterone (PROMETRIUM) 200 MG capsule, Take 2 capsules (400 mg total) by mouth daily., Disp: 180 capsule, Rfl: 1  EXAM:  VITALS per patient if applicable:  GENERAL: alert, oriented, appears well and in no acute distress  HEENT: atraumatic, conjunttiva clear, no obvious abnormalities on inspection of external nose and ears  NECK: normal  movements of the head and neck  LUNGS: on inspection no signs of respiratory distress, breathing rate appears normal, no obvious gross SOB, gasping or wheezing  CV: no obvious cyanosis  MS: moves all visible extremities without noticeable abnormality  PSYCH/NEURO: pleasant and cooperative, no obvious depression or anxiety, speech and thought processing grossly intact  ASSESSMENT AND PLAN:  Discussed the following assessment and plan:  COVID-19   Discussed treatment options, side effect and risk of drug interactions, ideal treatment window, potential complications, isolation and precautions for COVID-19.  Advised if any further chest discomfort or difficulty breathing she should be evaluated inperson promptly. She reports she did not feel was at that point and declined inperson care at this time, but agreed to go if further symptoms develop or symptoms worsen. Discussed possibility of rebound with or without antivirals. Checked for/reviewed last GFR - listed in HPI if available. After lengthy discussion, the patient opted for treatment with Legevrio due to being higher risk for complications of covid or severe  disease and other factors. Discussed  preliminary limited knowledge of risks/interactions/side effects per EUA document vs possible benefits and precautions. This information was shared with patient during the visit and also was provided in patient instructions. The patient did want a prescription for cough, Tessalon Rx sent.  Other symptomatic care measures summarized in patient instructions. . Advised to seek prompt virtual visit or in person care if worsening, new symptoms arise, or if is not improving with treatment as expected per our conversation of expected course. Discussed options for follow up care. Did let this patient know that I do telemedicine on Tuesdays and Thursdays for [place] and those are the days I am logged into the system. Advised to schedule follow up visit with PCP, [place] virtual visits or [facility] if any further questions or concerns to avoid delays in care.   I discussed the assessment and treatment plan with the patient. The patient was provided an opportunity to ask questions and all were answered. The patient agreed with the plan and demonstrated an understanding of the instructions.     Lucretia Kern, DO

## 2022-01-26 NOTE — Patient Instructions (Addendum)
HOME CARE TIPS:   -I sent the medication(s) we discussed to your pharmacy: Meds ordered this encounter  Medications   molnupiravir EUA (LAGEVRIO) 200 mg CAPS capsule    Sig: Take 4 capsules (800 mg total) by mouth 2 (two) times daily for 5 days.    Dispense:  40 capsule    Refill:  0   benzonatate (TESSALON PERLES) 100 MG capsule    Sig: 1-2 capsules up to twice daily as needed for cough.    Dispense:  30 capsule    Refill:  0     -I sent in the [facility] treatment or referral you requested per our discussion. Please see the information provided below and discuss further with the pharmacist/treatment team.   -SEEK inperson care right away if chest pain, chest discomfort or difficulty breathing.   -there is a chance of rebound illness with covid after improving. This can happen whether or not you take an antiviral treatment. If you become sick again with covid after getting better, please schedule a follow up virtual visit and isolate again.  -can use tylenol if needed for fevers, aches and pains per instructions  -nasal saline sinus rinses twice daily  -stay hydrated, drink plenty of fluids and eat small healthy meals - avoid dairy  -can take 1000 IU (47mg) Vit D3 and 100-500 mg of Vit C daily per instructions  -follow up with your doctor in 2-3 days unless improving and feeling better  -stay home while sick, except to seek medical care. If you have COVID19, you will likely be contagious for 7-10 days. Flu or Influenza is likely contagious for about 7 days. Other respiratory viral infections remain contagious for 5-10+ days depending on the virus and many other factors. Wear a good mask that fits snugly (such as N95 or KN95) if around others to reduce the risk of transmission.  It was nice to meet you today, and I really hope you are feeling better soon. I help [facility] out with telemedicine visits on Tuesdays and Thursdays and am happy to help if you need a follow up virtual  visit on those days. Otherwise, if you have any concerns or questions following this visit please schedule a follow up visit with your Primary Care doctor or seek care at a local urgent care clinic to avoid delays in care.    Seek in person care or schedule a follow up video visit promptly if your symptoms worsen, new concerns arise or you are not improving with treatment. Call 911 and/or seek emergency care if your symptoms are severe or life threatening.  PLEASE SEE THE FOLLOWING LINK FOR THE MOST UPDATED INFORMATION ABOUT LAGEVRIO:  [email or web address]      Fact Sheet for Patients And Caregivers Emergency Use Authorization (EUA) Of LAGEVRIOT (molnupiravir) capsules For Coronavirus Disease 2019 (COVID-19)  What is the most important information I should know about LAGEVRIO? LAGEVRIO may cause serious side effects, including: ? LAGEVRIO may cause harm to your unborn baby. It is not known if LAGEVRIO will harm your baby if you take LAGEVRIO during pregnancy. o LAGEVRIO is not recommended for use in pregnancy. o LAGEVRIO has not been studied in pregnancy. LAGEVRIO was studied in pregnant animals only. When LAGEVRIO was given to pregnant animals, LAGEVRIO caused harm to their unborn babies. o You and your healthcare provider may decide that you should take LAGEVRIO during pregnancy if there are no other COVID-19 treatment options approved or authorized by the FDA that are accessible or clinically  appropriate for you. o If you and your healthcare provider decide that you should take LAGEVRIO during pregnancy, you and your healthcare provider should discuss the known and potential benefits and the potential risks of taking LAGEVRIO during pregnancy. For individuals who are able to become pregnant: ? You should use a reliable method of birth control (contraception) consistently and correctly during treatment with LAGEVRIO and for 4 days after the last dose of LAGEVRIO. Talk to your  healthcare provider about reliable birth control methods. ? Before starting treatment with [facility] your healthcare provider may do a pregnancy test to see if you are pregnant before starting treatment with LAGEVRIO. ? Tell your healthcare provider right away if you become pregnant or think you may be pregnant during treatment with LAGEVRIO. Pregnancy Surveillance Program: ? There is a pregnancy surveillance program for individuals who take LAGEVRIO during pregnancy. The purpose of this program is to collect information about the health of you and your baby. Talk to your healthcare provider about how to take part in this program. ? If you take LAGEVRIO during pregnancy and you agree to participate in the pregnancy surveillance program and allow your healthcare provider to share your information with [place], then your healthcare provider will report your use of [place] during pregnancy to [place]. by calling [phone] or [email or web address]. For individuals who are sexually active with partners who are able to become pregnant: ? It is not known if LAGEVRIO can affect sperm. While the risk is regarded as low, animal studies to fully assess the potential for LAGEVRIO to affect the babies of males treated with LAGEVRIO have not been completed. A reliable method of birth control (contraception) should be used consistently and correctly during treatment with LAGEVRIO and for at least 3 months after the last dose. The risk to sperm beyond 3 months is not known. Studies to understand the risk to sperm beyond 3 months are ongoing. Talk to your healthcare provider about reliable birth control methods. Talk to your healthcare provider if you have questions or concerns about how LAGEVRIO may affect sperm. You are being given this fact sheet because your healthcare provider believes it is necessary to provide you with LAGEVRIO for the treatment of adults with  mild-to-moderate coronavirus disease 2019 (COVID-19) with positive results of direct SARS-CoV-2 viral testing, and who are at high risk for progression to severe COVID-19 including hospitalization or death, and for whom other COVID-19 treatment options approved or authorized by the FDA are not accessible or clinically appropriate. The U.S. Food and Drug Administration (FDA) has issued an Emergency Use Authorization (EUA) to make LAGEVRIO available during the COVID-19 pandemic (for more details about an EUA please see "What is an Emergency Use Authorization?" at the end of this document). LAGEVRIO is not an FDA-approved medicine in the [place]. Read this Fact Sheet for information about LAGEVRIO. Talk to your healthcare provider about your options if you have any questions. It is your choice to take LAGEVRIO.  What is COVID-19? COVID-19 is caused by a virus called a coronavirus. You can get COVID-19 through close contact with another person who has the virus. COVID-19 illnesses have ranged from very mild-to-severe, including illness resulting in death. While information so far suggests that most COVID-19 illness is mild, serious illness can happen and may cause some of your other medical conditions to become worse. Older people and people of all ages with severe, long lasting (chronic) medical conditions like heart disease, lung  disease and diabetes, for example seem to be at higher risk of being hospitalized for COVID-19.  What is LAGEVRIO? LAGEVRIO is an investigational medicine used to treat mild-to-moderate COVID-19 in adults: ? with positive results of direct SARS-CoV-2 viral testing, and ? who are at high risk for progression to severe COVID-19 including hospitalization or death, and for whom other COVID-19 treatment options approved or authorized by the FDA are not accessible or clinically appropriate. The FDA has authorized the emergency use of LAGEVRIO for the treatment of  mild-tomoderate COVID-19 in adults under an EUA. For more information on EUA, see the "What is an Emergency Use Authorization (EUA)?" section at the end of this Fact Sheet. LAGEVRIO is not authorized: ? for use in people less than 31 years of age. ? for prevention of COVID-19. ? for people needing hospitalization for COVID-19. ? for use for longer than 5 consecutive days.  What should I tell my healthcare provider before I take LAGEVRIO? Tell your healthcare provider if you: ? Have any allergies ? Are breastfeeding or plan to breastfeed ? Have any serious illnesses ? Are taking any medicines (prescription, over-the-counter, vitamins, or herbal products).  How do I take LAGEVRIO? ? Take LAGEVRIO exactly as your healthcare provider tells you to take it. ? Take 4 capsules of LAGEVRIO every 12 hours (for example, at 8 am and at 8 pm) ? Take LAGEVRIO for 5 days. It is important that you complete the full 5 days of treatment with LAGEVRIO. Do not stop taking LAGEVRIO before you complete the full 5 days of treatment, even if you feel better. ? Take LAGEVRIO with or without food. ? You should stay in isolation for as long as your healthcare provider tells you to. Talk to your healthcare provider if you are not sure about how to properly isolate while you have COVID-19. ? Swallow LAGEVRIO capsules whole. Do not open, break, or crush the capsules. If you cannot swallow capsules whole, tell your healthcare provider. ? What to do if you miss a dose: o If it has been less than 10 hours since the missed dose, take it as soon as you remember o If it has been more than 10 hours since the missed dose, skip the missed dose and take your dose at the next scheduled time. ? Do not double the dose of LAGEVRIO to make up for a missed dose.  What are the important possible side effects of LAGEVRIO? ? See, "What is the most important information I should know about LAGEVRIO?" ? Allergic Reactions.  Allergic reactions can happen in people taking LAGEVRIO, even after only 1 dose. Stop taking LAGEVRIO and call your healthcare provider right away if you get any of the following symptoms of an allergic reaction: o hives o rapid heartbeat o trouble swallowing or breathing o swelling of the mouth, lips, or face o throat tightness o hoarseness o skin rash The most common side effects of LAGEVRIO are: ? diarrhea ? nausea ? dizziness These are not all the possible side effects of LAGEVRIO. Not many people have taken LAGEVRIO. Serious and unexpected side effects may happen. This medicine is still being studied, so it is possible that all of the risks are not known at this time.  What other treatment choices are there?  Veklury (remdesivir) is FDA-approved as an intravenous (IV) infusion for the treatment of mildto-moderate VOJJK-09 in certain adults and children. Talk with your doctor to see if Marijean Heath is appropriate for you. Like LAGEVRIO, FDA  may also allow for the emergency use of other medicines to treat people with COVID-19. Go to [email or web address] for more information. It is your choice to be treated or not to be treated with LAGEVRIO. Should you decide not to take it, it will not change your standard medical care.  What if I am breastfeeding? Breastfeeding is not recommended during treatment with LAGEVRIO and for 4 days after the last dose of LAGEVRIO. If you are breastfeeding or plan to breastfeed, talk to your healthcare provider about your options and specific situation before taking LAGEVRIO.  How do I report side effects with LAGEVRIO? Contact your healthcare provider if you have any side effects that bother you or do not go away. Report side effects to FDA MedWatch at SmoothHits.hu or call 1-800-FDA-1088 (1- 619-145-5276).  How should I store [place]? ? Store  LAGEVRIO capsules at room temperature between 91F to 40F (20C to 25C). ? Keep LAGEVRIO and all medicines out of the reach of children and pets. How can I learn more about COVID-19? ? Ask your healthcare provider. ? Visit SeekRooms.co.[email or web address] ? Contact your local or state public health department. ? Call [place] at [phone] (toll free in the [place] ? Visit [email or web address]  What Is an Emergency Use Authorization (EUA)? The [place] FDA has made [facility] available under an emergency access mechanism called an Emergency Use Authorization (EUA) The EUA is supported by a Presenter, broadcasting Health and Human Service (HHS) declaration that circumstances exist to justify emergency use of drugs and biological products during the COVID-19 pandemic. LAGEVRIO for the treatment of mild-to-moderate COVID-19 in adults with positive results of direct SARS-CoV-2 viral testing, who are at high risk for progression to severe COVID-19, including hospitalization or death, and for whom alternative COVID-19 treatment options approved or authorized by FDA are not accessible or clinically appropriate, has not undergone the same type of review as an FDA-approved product. In issuing an EUA under the DHWYS-16 public health emergency, the FDA has determined, among other things, that based on the total amount of scientific evidence available including data from adequate and well-controlled clinical trials, if available, it is reasonable to believe that the product may be effective for diagnosing, treating, or preventing COVID-19, or a serious or life-threatening disease or condition caused by COVID-19; that the known and potential benefits of the product, when used to diagnose, treat, or prevent such disease or condition, outweigh the known and potential risks of such product; and that there are no adequate, approved, and available alternatives.  All of these criteria must be met to allow for the  product to be used in the treatment of patients during the COVID-19 pandemic. The EUA for LAGEVRIO is in effect for the duration of the COVID-19 declaration justifying emergency use of LAGEVRIO, unless terminated or revoked (after which LAGEVRIO may no longer be used under the EUA). For patent information: http://rogers.info/ Copyright  2021-2022 [employer] [place], [place] and its affiliates. All rights reserved. [id number] Revised: March 2022

## 2022-02-20 ENCOUNTER — Other Ambulatory Visit: Payer: Self-pay | Admitting: Family Medicine

## 2022-02-20 NOTE — Telephone Encounter (Signed)
Pt called back with the fax number [phone].

## 2022-02-20 NOTE — Addendum Note (Signed)
Addended by: Patrcia Dolly on: 02/20/2022 03:30 PM   Modules accepted: Orders

## 2022-02-21 LAB — COMPREHENSIVE METABOLIC PANEL
ALT: 13 IU/L (ref 0–32)
AST: 16 IU/L (ref 0–40)
Albumin/Globulin Ratio: 1.4 (ref 1.2–2.2)
Albumin: 4.1 g/dL (ref 3.9–4.9)
Alkaline Phosphatase: 70 IU/L (ref 44–121)
BUN/Creatinine Ratio: 15 (ref 12–28)
BUN: 15 mg/dL (ref 8–27)
Bilirubin Total: 0.3 mg/dL (ref 0.0–1.2)
CO2: 22 mmol/L (ref 20–29)
Calcium: 9.3 mg/dL (ref 8.7–10.3)
Chloride: 100 mmol/L (ref 96–106)
Creatinine, Ser: 0.97 mg/dL (ref 0.57–1.00)
Globulin, Total: 2.9 g/dL (ref 1.5–4.5)
Glucose: 99 mg/dL (ref 70–99)
Potassium: 4.5 mmol/L (ref 3.5–5.2)
Sodium: 138 mmol/L (ref 134–144)
Total Protein: 7 g/dL (ref 6.0–8.5)
eGFR: 65 mL/min/{1.73_m2} (ref >59)

## 2022-02-24 ENCOUNTER — Other Ambulatory Visit: Payer: Self-pay | Admitting: Cardiology

## 2022-02-27 ENCOUNTER — Other Ambulatory Visit: Payer: Self-pay | Admitting: Gastroenterology

## 2022-02-27 DIAGNOSIS — K219 Gastro-esophageal reflux disease without esophagitis: Secondary | ICD-10-CM

## 2022-02-27 MED ORDER — RABEPRAZOLE SODIUM 20 MG PO TBEC: 20.0000 mg | DELAYED_RELEASE_TABLET | Freq: Two times a day (BID) | ORAL | 3 refills | Status: DC

## 2022-03-01 ENCOUNTER — Other Ambulatory Visit: Payer: Self-pay | Admitting: Interventional Radiology

## 2022-03-01 DIAGNOSIS — K551 Chronic vascular disorders of intestine: Secondary | ICD-10-CM

## 2022-03-02 ENCOUNTER — Other Ambulatory Visit: Payer: Self-pay | Admitting: Interventional Radiology

## 2022-03-02 ENCOUNTER — Ambulatory Visit
Admission: RE | Admit: 2022-03-02 | Discharge: 2022-03-02 | Disposition: A | Discharge status: Home / Self Care | Payer: Self-pay | Source: Ambulatory Visit | Attending: Interventional Radiology | Admitting: Interventional Radiology

## 2022-03-02 DIAGNOSIS — K551 Chronic vascular disorders of intestine: Secondary | ICD-10-CM

## 2022-03-02 HISTORY — PX: IR RADIOLOGIST EVAL & MGMT: IMG5224

## 2022-03-02 NOTE — Progress Notes (Signed)
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   She is now status post SMA covered balloon-expandable stent placement on 10/29/20.  On follow up CTA in February, her stent was noted to be occluded.  At this time, she was asymptomatic, however between clinic visit and time of procedure she began to experience mild ischemic pain symptoms once again.   On 07/12/21 she underwent successful SMA stent recanalization with laser atherectomy, placement of new 5x39 mm VBX.   At this time, we also did a diagnostic bilateral lower extremity angiogram which revealed a mild stenosis just proximal to the indwelling left external iliac stent, but otherwise widely patent inflow, outflow, and runoff vessels.      She presents today via virtual telephone visit.   She has had persistent vague, peripheral abdominal pain that migrates from the right to the left side.  It is similar, though not nearly as severe, as prior pain when she had an occluded SMA.  She has remained compliant with DAPT.  She has started smoking again occasionally, attributed to stress.  Rectal bleeding has improved.   She describes her pain as sharp RLQ pain.  It is intermittent now, but was constant.  The pain wakes her from sleep occasionally.  7/10 at its worst.  Occasional post prandial pain, not always.  Eating less, but no weight loss.     Mesenteric duplex and carotid duplex studies were done on 01/13/22.    Past Medical History:  Diagnosis Date   Aortic regurgitation 07/20/2014   Aortic stenosis, severe 07/20/2014   Arthritis    DVT (deep venous thrombosis) (HCC)    Family history of adverse reaction to anesthesia    Reports father deliurm in his 3's with CABG   GERD (gastroesophageal reflux disease)    History of pneumonia    Hyperlipidemia     Hypertension    Insomnia, unspecified    Muscle weakness (generalized)    Peripheral artery disease (HCC)    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   Redo sternotomy for 21 mm Medtronic Freestyle porcine aortic root graft w/ reimplantation of left main and right coronary arteries   Sleep apnea    diagnosed multiple years ago at [facility] aortic stenosis, congenital - s/p repair during childhood     Past Surgical History:  Procedure Laterality Date   ABDOMINAL AORTAGRAM  06/24/2012   ABDOMINAL AORTAGRAM N/A 06/24/2012   Procedure: ABDOMINAL Maxcine Ham;  Surgeon: Angelia Mould, MD;  Location: [facility] CATH LAB;  Service: Cardiovascular;  Laterality: N/A;   AORTIC VALVE REPLACEMENT N/A 10/07/2014   Procedure: REDO AORTIC VALVE REPLACEMENT (AVR);  Surgeon: Rexene Alberts, MD;  Location: [place];  Service: Open Heart Surgery;  Laterality: N/A;   ASCENDING AORTIC ROOT REPLACEMENT N/A 10/07/2014   Procedure: ASCENDING AORTIC ROOT REPLACEMENT;  Surgeon: Rexene Alberts, MD;  Location: [place];  Service: Open Heart Surgery;  Laterality: N/A;   BIOPSY  09/27/2016   Procedure: BIOPSY;  Surgeon: Daneil Dolin, MD;  [facility];  Service: Endoscopy;;  colon   BIOPSY  10/18/2020   Procedure: BIOPSY;  Surgeon: Eloise Harman, DO;  [facility];  Service: Endoscopy;;   BREAST REDUCTION SURGERY Bilateral 01/21/2018   Procedure: BREAST REDUCTION WITH  LIPOSUCTION;  Surgeon: Cristine Polio, MD;  Location: [place];  Service: Plastics;  Laterality: Bilateral;   CARDIAC VALVE SURGERY  1968   CARPAL TUNNEL RELEASE Right 03/02/2020   Procedure: CARPAL TUNNEL RELEASE;  Surgeon: Carole Civil, MD;  Location: [facility];  Service: Orthopedics;  Laterality: Right;   CATARACT EXTRACTION W/PHACO Left 05/30/2019   Procedure: CATARACT EXTRACTION PHACO AND INTRAOCULAR LENS PLACEMENT (IOC) (CDE: 4.94  );   Surgeon: Baruch Goldmann, MD;  Location: [facility];  Service: Ophthalmology;  Laterality: Left;   CATARACT EXTRACTION W/PHACO Right 06/16/2019   Procedure: CATARACT EXTRACTION PHACO AND INTRAOCULAR LENS PLACEMENT (IOC);  Surgeon: Baruch Goldmann, MD;  Location: [facility];  Service: Ophthalmology;  Laterality: Right;  CDE: 4.64   CERVICAL FUSION     CHOLECYSTECTOMY     COLONOSCOPY WITH PROPOFOL N/A 09/27/2016   Dr. Gala Romney: Diverticulosis, several tubular adenomas removed ranging 4 to 7 mm in size, internal grade 1 hemorrhoids, terminal ileum normal, segmental biopsies negative for microscopic colitis.  Next colonoscopy June 2021   COLONOSCOPY WITH PROPOFOL N/A 05/06/2020   internal hemorrhoids, sigmoid diverticulosis. Surveillance colonoscopy due in 2027   ESOPHAGOGASTRODUODENOSCOPY (EGD) WITH PROPOFOL N/A 09/27/2016   Dr. Gala Romney: Small hiatal hernia, mild Schatzki ring status post disruption, LA grade a esophagitis   ESOPHAGOGASTRODUODENOSCOPY (EGD) WITH PROPOFOL N/A 10/18/2020   non-obstructing mild Schatzki ring, gastritis s/ biopsy. Negative H.pylori.   ILIAC ARTERY STENT Left 12/2007   IR ANGIOGRAM EXTREMITY BILATERAL  07/12/2021   IR ANGIOGRAM VISCERAL SELECTIVE  10/29/2020   IR ANGIOGRAM VISCERAL SELECTIVE  07/12/2021   IR IVUS EACH ADDITIONAL NON CORONARY VESSEL  07/12/2021   IR RADIOLOGIST EVAL & MGMT  10/26/2020   IR RADIOLOGIST EVAL & MGMT  11/10/2020   IR RADIOLOGIST EVAL & MGMT  02/09/2021   IR RADIOLOGIST EVAL & MGMT  06/16/2021   IR RADIOLOGIST EVAL & MGMT  08/15/2021   IR RADIOLOGIST EVAL & MGMT  10/18/2021   IR RADIOLOGIST EVAL & MGMT  01/11/2022   IR THROMBECT SEC MECH MOD SED  07/12/2021   IR TRANSCATH PLC STENT 1ST ART NOT LE CV CAR VERT CAR  10/29/2020   IR TRANSCATH PLC STENT 1ST ART NOT LE CV CAR VERT CAR  07/12/2021   IR US GUIDE VASC ACCESS RIGHT  10/29/2020   IR US GUIDE VASC ACCESS RIGHT  07/12/2021   KNEE ARTHROSCOPY WITH MEDIAL MENISECTOMY Right 01/10/2018   Procedure: RIGHT  KNEE ARTHROSCOPY WITH PARTIAL MEDIAL MENISECTOMY;  Surgeon: Carole Civil, MD;  Location[facility];  Service: Orthopedics;  Laterality: Right;   LEFT AND RIGHT HEART CATHETERIZATION WITH CORONARY ANGIOGRAM N/A 07/31/2014   Procedure: LEFT AND RIGHT HEART CATHETERIZATION WITH CORONARY ANGIOGRAM;  Surgeon: Burnell Blanks, MD;  Location: [facility] CATH LAB;  Service: Cardiovascular;  Laterality: N/A;   MALONEY DILATION N/A 09/27/2016   Procedure: Venia Minks DILATION;  Surgeon: Daneil Dolin, MD;  [facility];  Service: Endoscopy;  Laterality: N/A;   POLYPECTOMY  09/27/2016   Procedure: POLYPECTOMY;  Surgeon: Daneil Dolin, MD;  [facility];  Service: Endoscopy;;  colon   ROTATOR CUFF REPAIR Bilateral    TEE WITHOUT CARDIOVERSION N/A 07/07/2014   Procedure: TRANSESOPHAGEAL ECHOCARDIOGRAM (TEE);  Surgeon: Arnoldo Lenis, MD;  [facility];  Service: Cardiology;  Laterality: N/A;   TEE WITHOUT CARDIOVERSION N/A 07/20/2014   Procedure: TRANSESOPHAGEAL ECHOCARDIOGRAM (TEE) WITH PROPOFOL;  Surgeon: Arnoldo Lenis, MD;  Location: [facility];  Service: Endoscopy;  Laterality: N/A;   TEE WITHOUT CARDIOVERSION N/A 10/07/2014   Procedure: TRANSESOPHAGEAL ECHOCARDIOGRAM (TEE);  Surgeon: Rexene Alberts, MD;  Location: [place];  Service: Open Heart Surgery;  Laterality: N/A;    Allergies: Iodinated contrast media and Sulfa antibiotics  Medications: Prior to Admission medications   Medication Sig Start Date End Date Taking? Authorizing Provider  aspirin EC 81 MG tablet Take 1 tablet (81 mg total) by mouth daily. Swallow whole. 12/16/19   Arnoldo Lenis, MD  benzonatate (TESSALON PERLES) 100 MG capsule 1-2 capsules up to twice daily as needed for cough. 01/26/22   Lucretia Kern, DO  Cholecalciferol (DIALYVITE VITAMIN D 5000) 125 MCG (5000 UT) capsule Take 5,000 Units by mouth daily.    [provider]  clopidogrel (PLAVIX) 75 MG tablet Take 1 tablet (75 mg  total) by mouth daily. 07/13/21   Theresa Duty, NP  diclofenac (VOLTAREN) 75 MG EC tablet TAKE 1 TABLET TWICE A DAY Patient taking differently: Take 75 mg by mouth 2 (two) times daily. 03/03/21   Carole Civil, MD  dicyclomine (BENTYL) 10 MG capsule Take 1 capsule (10 mg total) by mouth every 8 (eight) hours as needed for spasms. For left-sided discomfort and frequent stools. Watch for constipation, dry mouth, dizziness. 12/06/21   Annitta Needs, NP  estradiol (ESTRACE) 2 MG tablet Take 1 tablet (2 mg total) by mouth daily. 01/19/21   Wendie Agreste, MD  ezetimibe (ZETIA) 10 MG tablet Take 1 tablet (10 mg total) by mouth daily. 01/13/22 01/08/23  Dunn, Nedra Hai, PA-C  Flaxseed, Linseed, (FLAX SEED OIL PO) Take 1 capsule by mouth daily.    [provider]  hydrocortisone (ANUSOL-HC) 2.5 % rectal cream Place 1 Application rectally 2 (two) times daily. As needed for rectal bleeding. 12/06/21   Annitta Needs, NP  losartan (COZAAR) 25 MG tablet TAKE 1 TABLET BY MOUTH DAILY Patient taking differently: Take 25 mg by mouth daily. 06/27/21   Wendie Agreste, MD  metoprolol tartrate (LOPRESSOR) 25 MG tablet TAKE 1 TABLET TWICE A DAY 02/24/22   Arnoldo Lenis, MD  Multiple Minerals (CALCIUM-MAGNESIUM-ZINC) TABS Take 1 tablet by mouth daily.    [provider]  Multiple Vitamin (MULTIVITAMIN WITH MINERALS) TABS tablet Take 1 tablet by mouth daily.    [provider]  NP THYROID 120 MG tablet Take 120 mg by mouth every morning. 01/11/22   [provider]  progesterone (PROMETRIUM) 200 MG capsule Take 2 capsules (400 mg total) by mouth daily. 01/19/21 12/06/21  Wendie Agreste, MD  RABEprazole (ACIPHEX) 20 MG tablet Take 1 tablet (20 mg total) by mouth 2 (two) times daily before a meal. 02/27/22   Sherron Monday, NP  rosuvastatin (CRESTOR) 40 MG tablet TAKE 1 TABLET DAILY 01/23/22   Arnoldo Lenis, MD  triamcinolone ointment (KENALOG) 0.1 % Apply 1 Application  topically 2 (two) times daily as needed. 11/25/21   Valentina Shaggy, MD     Family History  Problem Relation Age of Onset   Hypertension Mother    Hypertension Father    Heart disease Father        before age 57   Other Father        varicose veins   COPD Father    Colon cancer Neg Hx    Celiac disease Neg Hx    Inflammatory bowel disease Neg Hx     Social History   Socioeconomic  History   Marital status: Married    Spouse name: Not on file   Number of children: 0   Years of education: some coll   Highest education level: Not on file  Occupational History   Occupation: call center    Employer: [employer]    Comment: [employer]  Tobacco Use   Smoking status: Former    Packs/day: 0.50    Years: 40.00    Total pack years: 20.00    Types: Cigarettes    Quit date: 05/2021    Years since quitting: 0.7   Smokeless tobacco: Never  Vaping Use   Vaping Use: Never used  Substance and Sexual Activity   Alcohol use: Yes    Alcohol/week: 2.0 standard drinks of alcohol    Types: 2 Cans of beer per week   Drug use: No   Sexual activity: Yes    Birth control/protection: None  Other Topics Concern   Not on file  Social History Narrative   Patient is married   for 5 years . Patient works at [employer].. Some college education-did not Writer.Originally from [place].   Social Determinants of Health   Financial Resource Strain: Not on file  Food Insecurity: Not on file  Transportation Needs: Not on file  Physical Activity: Not on file  Stress: Not on file  Social Connections: Not on file     Vital Signs: There were no vitals taken for this visit.  No physical examination was performed in lieu of virtual telephone clinic visit.  Imaging: ABI (06/03/21--> 11/25/21) R = 1.11 --> 1.07 L = 0.93 --> 1.02     CTA [facility] (06/09/21)  Interval occlusion of SMA stent, with proximal native reconstitution.  Prominent Arc of [place].  Unchanged mild/moderate celiac  stenosis, widely patent IMA.     CTA [facility] (07/15/21)  Recanalized and relined SMA stent, widely patent.   Angiogram (07/12/21)  Mild proximal left EIA stenosis and mild in-stent restenosis.     10/11/21  Patent SMA stent  US Carotid 01/13/22 IMPRESSION: Color duplex indicates minimal heterogeneous plaque, with no hemodynamically significant stenosis by duplex criteria in the extracranial cerebrovascular circulation.   [place] Mesenteric 01/13/22 MPRESSION: Directed duplex of the mesenteric vasculature demonstrates patent stent with questionable mid stent stenosis versus artifact.  Celiac artery not evaluated given the poor sonographic window.    Labs:  CBC: Recent Labs    07/15/21 1358 10/11/21 1701 12/13/21 1609 12/29/21 1557  WBC 8.6 6.4 7.8 9.1  HGB 11.6* 12.0 13.9 12.8  HCT 34.7* 36.3 41.9 38.7  PLT 219 219 264 222    COAGS: Recent Labs    07/12/21 1107  INR 1.0    BMP: Recent Labs    07/12/21 1107 07/15/21 1358 10/11/21 1553 10/11/21 1701 02/20/22 1626  NA 139 136  --  135 138  K 4.2 3.9  --  4.3 4.5  CL 108 104  --  108 100  CO2 23 24  --  22 22  GLUCOSE 102* 110*  --  133* 99  BUN 10 14  --  15 15  CALCIUM 9.2 8.6*  --  8.1* 9.3  CREATININE 0.82 0.74 0.90 0.92 0.97  GFRNONAA >60 >60  --  >60  --     LIVER FUNCTION TESTS: Recent Labs    06/17/21 0743 07/15/21 1358 10/11/21 1701 02/20/22 1626  BILITOT 0.5 0.3 0.2* 0.3  AST '19 18 20 16  '$ ALT '18 20 16 13  '$ ALKPHOS  --  35 43 70  PROT 6.6 6.9 5.6* 7.0  ALBUMIN  --  3.6 3.0* 4.1    Assessment and Plan: 66 year old female with history of atherosclerotic disease including peripheral artery disease and chronic mesenteric ischemia now status post superior mesenteric artery covered stent placement on 10/29/20 and stent recanalization with re-lining on 07/12/21.  At that time, we also performed bilateral lower extremity angiogram for diagnostic purposes.     She is optimized from a medical standpoint  with good control of her blood pressure, hyperlipidemia, and on dual antiplatelet therapy. She had been successful with smoking cessation since February, but has recently started smoking some again.  Recent ABIs normal, improved from prior.     Continued vague abdominal pain suspicious for re-stenosis of SMA stent, and/or continued stenosis of previously visualized celiac artery stenosis.    Difficult to image due to poor sonographic window and iodinated contrast anaphylaxis.    We discussed repeat CO2 angiogram + IVUS versus watchful waiting.  At this time we'll re-assess in 1 month.   -continue aspirin, plavix -resume smoking cessation -plan for 1 month IR clinic follow up  Electronically Signed: Suzette Battiest 03/02/2022, 1:01 PM   I spent a total of 25 Minutes in virtual telephone clinical consultation, greater than 50% of which was counseling/coordinating care for chronic mesenteric ischemia.

## 2022-03-14 ENCOUNTER — Encounter (HOSPITAL_COMMUNITY): Payer: Self-pay

## 2022-03-14 ENCOUNTER — Other Ambulatory Visit: Payer: Self-pay

## 2022-03-14 ENCOUNTER — Inpatient Hospital Stay (HOSPITAL_COMMUNITY)
Admission: EM | Admit: 2022-03-14 | Discharge: 2022-03-22 | DRG: 270 | Disposition: A | Discharge status: Home / Self Care | Payer: Medicare Other | Source: Ambulatory Visit | Attending: General Surgery | Admitting: General Surgery

## 2022-03-14 ENCOUNTER — Ambulatory Visit (INDEPENDENT_AMBULATORY_CARE_PROVIDER_SITE_OTHER): Payer: Medicare Other | Admitting: Gastroenterology

## 2022-03-14 ENCOUNTER — Encounter (HOSPITAL_COMMUNITY): Payer: Self-pay | Admitting: *Deleted

## 2022-03-14 ENCOUNTER — Encounter: Payer: Self-pay | Admitting: Gastroenterology

## 2022-03-14 ENCOUNTER — Emergency Department (HOSPITAL_COMMUNITY): Payer: Medicare Other

## 2022-03-14 VITALS — BP 132/82 | HR 89 | Temp 97.6°F | Ht 64.0 in | Wt 220.4 lb

## 2022-03-14 DIAGNOSIS — K551 Chronic vascular disorders of intestine: Secondary | ICD-10-CM

## 2022-03-14 DIAGNOSIS — E782 Mixed hyperlipidemia: Secondary | ICD-10-CM

## 2022-03-14 DIAGNOSIS — Z95820 Peripheral vascular angioplasty status with implants and grafts: Secondary | ICD-10-CM | POA: Diagnosis not present

## 2022-03-14 DIAGNOSIS — Z882 Allergy status to sulfonamides status: Secondary | ICD-10-CM | POA: Diagnosis not present

## 2022-03-14 DIAGNOSIS — T82868A Thrombosis of vascular prosthetic devices, implants and grafts, initial encounter: Principal | ICD-10-CM | POA: Diagnosis present

## 2022-03-14 DIAGNOSIS — K3532 Acute appendicitis with perforation and localized peritonitis, without abscess: Secondary | ICD-10-CM | POA: Diagnosis not present

## 2022-03-14 DIAGNOSIS — I10 Essential (primary) hypertension: Secondary | ICD-10-CM | POA: Diagnosis present

## 2022-03-14 DIAGNOSIS — Z6834 Body mass index (BMI) 34.0-34.9, adult: Secondary | ICD-10-CM | POA: Insufficient documentation

## 2022-03-14 DIAGNOSIS — K55059 Acute (reversible) ischemia of intestine, part and extent unspecified: Secondary | ICD-10-CM | POA: Diagnosis not present

## 2022-03-14 DIAGNOSIS — E46 Unspecified protein-calorie malnutrition: Secondary | ICD-10-CM

## 2022-03-14 DIAGNOSIS — F17210 Nicotine dependence, cigarettes, uncomplicated: Secondary | ICD-10-CM | POA: Diagnosis present

## 2022-03-14 DIAGNOSIS — Z825 Family history of asthma and other chronic lower respiratory diseases: Secondary | ICD-10-CM

## 2022-03-14 DIAGNOSIS — K559 Vascular disorder of intestine, unspecified: Secondary | ICD-10-CM | POA: Diagnosis not present

## 2022-03-14 DIAGNOSIS — E876 Hypokalemia: Secondary | ICD-10-CM | POA: Diagnosis present

## 2022-03-14 DIAGNOSIS — Z9049 Acquired absence of other specified parts of digestive tract: Secondary | ICD-10-CM

## 2022-03-14 DIAGNOSIS — Z91041 Radiographic dye allergy status: Secondary | ICD-10-CM

## 2022-03-14 DIAGNOSIS — F419 Anxiety disorder, unspecified: Secondary | ICD-10-CM | POA: Diagnosis present

## 2022-03-14 DIAGNOSIS — I352 Nonrheumatic aortic (valve) stenosis with insufficiency: Secondary | ICD-10-CM | POA: Diagnosis present

## 2022-03-14 DIAGNOSIS — G4700 Insomnia, unspecified: Secondary | ICD-10-CM | POA: Diagnosis present

## 2022-03-14 DIAGNOSIS — E872 Acidosis, unspecified: Secondary | ICD-10-CM | POA: Diagnosis not present

## 2022-03-14 DIAGNOSIS — Z8774 Personal history of (corrected) congenital malformations of heart and circulatory system: Secondary | ICD-10-CM

## 2022-03-14 DIAGNOSIS — Z79899 Other long term (current) drug therapy: Secondary | ICD-10-CM | POA: Diagnosis not present

## 2022-03-14 DIAGNOSIS — K219 Gastro-esophageal reflux disease without esophagitis: Secondary | ICD-10-CM

## 2022-03-14 DIAGNOSIS — Z87892 Personal history of anaphylaxis: Secondary | ICD-10-CM

## 2022-03-14 DIAGNOSIS — Z7901 Long term (current) use of anticoagulants: Secondary | ICD-10-CM

## 2022-03-14 DIAGNOSIS — Z7982 Long term (current) use of aspirin: Secondary | ICD-10-CM | POA: Diagnosis not present

## 2022-03-14 DIAGNOSIS — Z952 Presence of prosthetic heart valve: Secondary | ICD-10-CM

## 2022-03-14 DIAGNOSIS — D72829 Elevated white blood cell count, unspecified: Secondary | ICD-10-CM

## 2022-03-14 DIAGNOSIS — Z7902 Long term (current) use of antithrombotics/antiplatelets: Secondary | ICD-10-CM

## 2022-03-14 DIAGNOSIS — Z8249 Family history of ischemic heart disease and other diseases of the circulatory system: Secondary | ICD-10-CM | POA: Diagnosis not present

## 2022-03-14 DIAGNOSIS — Y712 Prosthetic and other implants, materials and accessory cardiovascular devices associated with adverse incidents: Secondary | ICD-10-CM | POA: Diagnosis present

## 2022-03-14 DIAGNOSIS — E669 Obesity, unspecified: Secondary | ICD-10-CM | POA: Diagnosis present

## 2022-03-14 DIAGNOSIS — E039 Hypothyroidism, unspecified: Secondary | ICD-10-CM | POA: Diagnosis present

## 2022-03-14 DIAGNOSIS — E8809 Other disorders of plasma-protein metabolism, not elsewhere classified: Secondary | ICD-10-CM | POA: Diagnosis present

## 2022-03-14 DIAGNOSIS — Z8701 Personal history of pneumonia (recurrent): Secondary | ICD-10-CM

## 2022-03-14 DIAGNOSIS — R109 Unspecified abdominal pain: Secondary | ICD-10-CM | POA: Diagnosis present

## 2022-03-14 DIAGNOSIS — M1990 Unspecified osteoarthritis, unspecified site: Secondary | ICD-10-CM | POA: Diagnosis present

## 2022-03-14 DIAGNOSIS — Z6837 Body mass index (BMI) 37.0-37.9, adult: Secondary | ICD-10-CM

## 2022-03-14 LAB — URINALYSIS, ROUTINE W REFLEX MICROSCOPIC
Bilirubin Urine: NEGATIVE
Glucose, UA: NEGATIVE mg/dL
Hgb urine dipstick: NEGATIVE
Ketones, ur: 20 mg/dL — AB
Leukocytes,Ua: NEGATIVE
Nitrite: NEGATIVE
Protein, ur: 30 mg/dL — AB
Specific Gravity, Urine: 1.016 (ref 1.005–1.030)
pH: 7 (ref 5.0–8.0)

## 2022-03-14 LAB — COMPREHENSIVE METABOLIC PANEL
ALT: 15 U/L (ref 0–44)
AST: 14 U/L — ABNORMAL LOW (ref 15–41)
Albumin: 3.4 g/dL — ABNORMAL LOW (ref 3.5–5.0)
Alkaline Phosphatase: 78 U/L (ref 38–126)
Anion gap: 10 (ref 5–15)
BUN: 9 mg/dL (ref 8–23)
CO2: 24 mmol/L (ref 22–32)
Calcium: 9 mg/dL (ref 8.9–10.3)
Chloride: 102 mmol/L (ref 98–111)
Creatinine, Ser: 0.79 mg/dL (ref 0.44–1.00)
GFR, Estimated: >60 mL/min (ref >60)
Glucose, Bld: 98 mg/dL (ref 70–99)
Potassium: 3.9 mmol/L (ref 3.5–5.1)
Sodium: 136 mmol/L (ref 135–145)
Total Bilirubin: 0.6 mg/dL (ref 0.3–1.2)
Total Protein: 7.8 g/dL (ref 6.5–8.1)

## 2022-03-14 LAB — CBC
HCT: 35.6 % — ABNORMAL LOW (ref 36.0–46.0)
Hemoglobin: 12.1 g/dL (ref 12.0–15.0)
MCH: 29.5 pg (ref 26.0–34.0)
MCHC: 34 g/dL (ref 30.0–36.0)
MCV: 86.8 fL (ref 80.0–100.0)
Platelets: 370 10*3/uL (ref 150–400)
RBC: 4.1 MIL/uL (ref 3.87–5.11)
RDW: 11.7 % (ref 11.5–15.5)
WBC: 15.9 10*3/uL — ABNORMAL HIGH (ref 4.0–10.5)
nRBC: 0 % (ref 0.0–0.2)

## 2022-03-14 LAB — LIPASE, BLOOD: Lipase: 30 U/L (ref 11–51)

## 2022-03-14 LAB — PROTIME-INR
INR: 1.1 (ref 0.8–1.2)
Prothrombin Time: 14.5 seconds (ref 11.4–15.2)

## 2022-03-14 LAB — LACTIC ACID, PLASMA
Lactic Acid, Venous: 0.8 mmol/L (ref 0.5–1.9)
Lactic Acid, Venous: 1 mmol/L (ref 0.5–1.9)

## 2022-03-14 MED ORDER — ONDANSETRON HCL 4 MG/2ML IJ SOLN
4.0000 mg | Freq: Once | INTRAMUSCULAR | Status: AC
  Administered 2022-03-14: 4 mg via INTRAVENOUS
  Filled 2022-03-14: qty 2

## 2022-03-14 MED ORDER — PIPERACILLIN-TAZOBACTAM 3.375 G IVPB 30 MIN
3.3750 g | Freq: Once | INTRAVENOUS | Status: AC
  Administered 2022-03-14: 3.375 g via INTRAVENOUS

## 2022-03-14 MED ORDER — SODIUM CHLORIDE 0.9 % IV SOLN
INTRAVENOUS | Status: DC
  Administered 2022-03-18: 150 mL/h via INTRAVENOUS

## 2022-03-14 MED ORDER — HYDROMORPHONE HCL 1 MG/ML IJ SOLN
1.0000 mg | Freq: Once | INTRAMUSCULAR | Status: AC
  Administered 2022-03-14: 1 mg via INTRAVENOUS
  Filled 2022-03-14: qty 1

## 2022-03-14 MED ORDER — SODIUM CHLORIDE 0.9 % IV BOLUS
500.0000 mL | Freq: Once | INTRAVENOUS | Status: AC
  Administered 2022-03-14: 500 mL via INTRAVENOUS

## 2022-03-14 NOTE — Patient Instructions (Signed)
I recommend ED evaluation due to escalating pain from baseline.   I have also reached out to Dr. Serafina Royals to update regarding your presentation.  We will see you in 3 months!

## 2022-03-14 NOTE — H&P (Incomplete)
History and Physical    Patient: Michelle Patton VHQ:[id number] DOB: 01-18-1956 DOA: 03/14/2022 DOS: the patient was seen and examined on 03/15/2022 PCP: Wendie Agreste, MD  Patient coming from: Home  Chief Complaint:  Chief Complaint  Patient presents with   Abdominal Pain   HPI: Michelle Patton is a 65 y.o. female with medical history significant of mesenteric ischemia (follows with Dr. Serafina Royals), hyperlipidemia, hypertension, hypothyroidism who presents to the emergency department due to 3-week onset of right lower quadrant pain with pain extending to other parts of the abdomen.  Pain was constant, sharp in nature and rated as 10/10 on pain scale.  Pain worsens with any movement that involves raising her right leg, otherwise no known alleviating factors.  She followed with gastroenterology (Dr. Roseanne Kaufman) who sent her  to the ED for further evaluation and management.  She endorsed worsening abdominal pain with food including yogurt, so she has been avoiding eating due to pain.  She denies nausea or vomiting and also denies blood in stool, last BM was yesterday.  ED Course:  In the emergency department, she was intermittently tachycardic, BP was 150/84, other vital signs are within normal range.  Workup in the ED showed normal CBC except for WBC of 15.9, normal BMP, albumin 3.4, lipase 30, lactic acid x 2 was normal. CT abdomen and pelvis without contrast showed findings suggestive of perforated acute appendicitis with markedly limited evaluation. She was treated with Dilaudid, Zofran was given, IV Zosyn was provided and IV hydration was given. General surgery (Dr. Constance Haw) was called and advised the ED physician to talk to general surgery at [facility],.Dr. Thermon Leyland was consulted and agrees for patient to be admitted to [facility] with plan to consult on patient on arrival to [facility] and Dr. Serafina Royals will see patient in consultation tomorrow per ED physician. Hospitalist was asked to admit  patient for further evaluation and management.   Review of Systems: Review of systems as noted in the HPI. All other systems reviewed and are negative.   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   Redo sternotomy for 21 mm Medtronic Freestyle porcine aortic root graft w/ reimplantation of left main and right coronary arteries   Sleep apnea    diagnosed multiple years ago at [facility] aortic stenosis, congenital - s/p repair during childhood    Past Surgical History:  Procedure Laterality Date   ABDOMINAL AORTAGRAM  06/24/2012   ABDOMINAL AORTAGRAM N/A 06/24/2012   Procedure: ABDOMINAL Maxcine Ham;  Surgeon: Angelia Mould, MD;  Location: [facility] CATH LAB;  Service: Cardiovascular;  Laterality: N/A;   AORTIC VALVE REPLACEMENT N/A 10/07/2014   Procedure: REDO AORTIC VALVE REPLACEMENT (AVR);  Surgeon: Rexene Alberts, MD;  Location: [place];  Service: Open Heart Surgery;  Laterality: N/A;   ASCENDING AORTIC ROOT REPLACEMENT N/A 10/07/2014   Procedure: ASCENDING AORTIC ROOT REPLACEMENT;  Surgeon: Rexene Alberts, MD;  Location: [facility]  OR;  Service: Open Heart Surgery;  Laterality: N/A;   BIOPSY  09/27/2016   Procedure: BIOPSY;  Surgeon: Daneil Dolin, MD;  [facility];  Service: Endoscopy;;  colon   BIOPSY  10/18/2020   Procedure: BIOPSY;  Surgeon: Eloise Harman, DO;  [facility];  Service: Endoscopy;;   BREAST REDUCTION SURGERY Bilateral 01/21/2018    Procedure: BREAST REDUCTION WITH LIPOSUCTION;  Surgeon: Cristine Polio, MD;  Location: [place];  Service: Plastics;  Laterality: Bilateral;   CARDIAC VALVE SURGERY  1968   CARPAL TUNNEL RELEASE Right 03/02/2020   Procedure: CARPAL TUNNEL RELEASE;  Surgeon: Carole Civil, MD;  Location: [facility];  Service: Orthopedics;  Laterality: Right;   CATARACT EXTRACTION W/PHACO Left 05/30/2019   Procedure: CATARACT EXTRACTION PHACO AND INTRAOCULAR LENS PLACEMENT (IOC) (CDE: 4.94  );  Surgeon: Baruch Goldmann, MD;  Location: [facility];  Service: Ophthalmology;  Laterality: Left;   CATARACT EXTRACTION W/PHACO Right 06/16/2019   Procedure: CATARACT EXTRACTION PHACO AND INTRAOCULAR LENS PLACEMENT (IOC);  Surgeon: Baruch Goldmann, MD;  Location: [facility];  Service: Ophthalmology;  Laterality: Right;  CDE: 4.64   CERVICAL FUSION     CHOLECYSTECTOMY     COLONOSCOPY WITH PROPOFOL N/A 09/27/2016   Dr. Gala Romney: Diverticulosis, several tubular adenomas removed ranging 4 to 7 mm in size, internal grade 1 hemorrhoids, terminal ileum normal, segmental biopsies negative for microscopic colitis.  Next colonoscopy June 2021   COLONOSCOPY WITH PROPOFOL N/A 05/06/2020   internal hemorrhoids, sigmoid diverticulosis. Surveillance colonoscopy due in 2027   ESOPHAGOGASTRODUODENOSCOPY (EGD) WITH PROPOFOL N/A 09/27/2016   Dr. Gala Romney: Small hiatal hernia, mild Schatzki ring status post disruption, LA grade a esophagitis   ESOPHAGOGASTRODUODENOSCOPY (EGD) WITH PROPOFOL N/A 10/18/2020   non-obstructing mild Schatzki ring, gastritis s/ biopsy. Negative H.pylori.   ILIAC ARTERY STENT Left 12/2007   IR ANGIOGRAM EXTREMITY BILATERAL  07/12/2021   IR ANGIOGRAM VISCERAL SELECTIVE  10/29/2020   IR ANGIOGRAM VISCERAL SELECTIVE  07/12/2021   IR IVUS EACH ADDITIONAL NON CORONARY VESSEL  07/12/2021   IR RADIOLOGIST EVAL & MGMT  10/26/2020   IR RADIOLOGIST EVAL & MGMT  11/10/2020   IR RADIOLOGIST EVAL & MGMT  02/09/2021   IR  RADIOLOGIST EVAL & MGMT  06/16/2021   IR RADIOLOGIST EVAL & MGMT  08/15/2021   IR RADIOLOGIST EVAL & MGMT  10/18/2021   IR RADIOLOGIST EVAL & MGMT  01/11/2022   IR RADIOLOGIST EVAL & MGMT  03/02/2022   IR THROMBECT SEC MECH MOD SED  07/12/2021   IR TRANSCATH PLC STENT 1ST ART NOT LE CV CAR VERT CAR  10/29/2020   IR TRANSCATH PLC STENT 1ST ART NOT LE CV CAR VERT CAR  07/12/2021   IR US GUIDE VASC ACCESS RIGHT  10/29/2020   IR US GUIDE VASC ACCESS RIGHT  07/12/2021   KNEE ARTHROSCOPY WITH MEDIAL MENISECTOMY Right 01/10/2018   Procedure: RIGHT KNEE ARTHROSCOPY WITH PARTIAL MEDIAL MENISECTOMY;  Surgeon: Carole Civil, MD;  [facility];  Service: Orthopedics;  Laterality: Right;   LEFT AND RIGHT HEART CATHETERIZATION WITH CORONARY ANGIOGRAM N/A 07/31/2014   Procedure: LEFT AND RIGHT HEART CATHETERIZATION WITH CORONARY ANGIOGRAM;  Surgeon: Burnell Blanks, MD;  Location: [facility] CATH LAB;  Service: Cardiovascular;  Laterality: N/A;   MALONEY DILATION N/A 09/27/2016   Procedure: Venia Minks DILATION;  Surgeon: Daneil Dolin, MD;  [facility];  Service: Endoscopy;  Laterality: N/A;   POLYPECTOMY  09/27/2016   Procedure:  POLYPECTOMY;  Surgeon: Daneil Dolin, MD;  [facility];  Service: Endoscopy;;  colon   ROTATOR CUFF REPAIR Bilateral    TEE WITHOUT CARDIOVERSION N/A 07/07/2014   Procedure: TRANSESOPHAGEAL ECHOCARDIOGRAM (TEE);  Surgeon: Arnoldo Lenis, MD;  [facility];  Service: Cardiology;  Laterality: N/A;   TEE WITHOUT CARDIOVERSION N/A 07/20/2014   Procedure: TRANSESOPHAGEAL ECHOCARDIOGRAM (TEE) WITH PROPOFOL;  Surgeon: Arnoldo Lenis, MD;  Location: [facility];  Service: Endoscopy;  Laterality: N/A;   TEE WITHOUT CARDIOVERSION N/A 10/07/2014   Procedure: TRANSESOPHAGEAL ECHOCARDIOGRAM (TEE);  Surgeon: Rexene Alberts, MD;  Location: [place];  Service: Open Heart Surgery;  Laterality: N/A;    Social History:  reports that she quit smoking about 9  months ago. Her smoking use included cigarettes. She has a 20.00 pack-year smoking history. She has never used smokeless tobacco. She reports current alcohol use of about 2.0 standard drinks of alcohol per week. She reports that she does not use drugs.   Allergies  Allergen Reactions   Iodinated Contrast Media Anaphylaxis, Shortness Of Breath, Itching, Swelling and Other (See Comments)    Patient was given Omni 350 and suffered from itching, chest pain and shortness of breath. Patient was taken to ED after onset of reaction. 10/11/2021  MD zackowski noted pt had tongue and throat swelling, had to give pt epinephrine    Sulfa Antibiotics Nausea And Vomiting    Family History  Problem Relation Age of Onset   Hypertension Mother    Hypertension Father    Heart disease Father        before age 66   Other Father        varicose veins   COPD Father    Colon cancer Neg Hx    Celiac disease Neg Hx    Inflammatory bowel disease Neg Hx      Prior to Admission medications   Medication Sig Start Date End Date Taking? Authorizing Provider  aspirin EC 81 MG tablet Take 1 tablet (81 mg total) by mouth daily. Swallow whole. 12/16/19   Arnoldo Lenis, MD  Cholecalciferol (DIALYVITE VITAMIN D 5000) 125 MCG (5000 UT) capsule Take 5,000 Units by mouth daily.    [provider]  clopidogrel (PLAVIX) 75 MG tablet Take 1 tablet (75 mg total) by mouth daily. 07/13/21   Theresa Duty, NP  diclofenac (VOLTAREN) 75 MG EC tablet TAKE 1 TABLET TWICE A DAY Patient taking differently: Take 75 mg by mouth 2 (two) times daily. 03/03/21   Carole Civil, MD  dicyclomine (BENTYL) 10 MG capsule Take 1 capsule (10 mg total) by mouth every 8 (eight) hours as needed for spasms. For left-sided discomfort and frequent stools. Watch for constipation, dry mouth, dizziness. 12/06/21   Annitta Needs, NP  estradiol (ESTRACE) 2 MG tablet Take 1 tablet (2 mg total) by mouth daily. 01/19/21   Wendie Agreste, MD   ezetimibe (ZETIA) 10 MG tablet Take 1 tablet (10 mg total) by mouth daily. 01/13/22 01/08/23  Dunn, Nedra Hai, PA-C  Flaxseed, Linseed, (FLAX SEED OIL PO) Take 1 capsule by mouth daily.    [provider]  hydrocortisone (ANUSOL-HC) 2.5 % rectal cream Place 1 Application rectally 2 (two) times daily. As needed for rectal bleeding. 12/06/21   Annitta Needs, NP  losartan (COZAAR) 25 MG tablet TAKE 1 TABLET BY MOUTH DAILY Patient taking differently: Take 25 mg by mouth daily. 06/27/21   Wendie Agreste, MD  metoprolol tartrate (LOPRESSOR) 25 MG tablet TAKE 1 TABLET TWICE A DAY 02/24/22   Arnoldo Lenis, MD  Multiple Minerals (CALCIUM-MAGNESIUM-ZINC) TABS Take 1 tablet by mouth daily.    [provider]  Multiple Vitamin (MULTIVITAMIN WITH MINERALS) TABS tablet Take 1 tablet by mouth daily.    [provider]  NP THYROID 120 MG tablet Take 120 mg by mouth every morning. 01/11/22   [provider]  progesterone (PROMETRIUM) 200 MG capsule Take 2 capsules (400 mg total) by mouth daily. 01/19/21 12/06/21  Wendie Agreste, MD  RABEprazole (ACIPHEX) 20 MG tablet Take 1 tablet (20 mg total) by mouth 2 (two) times daily before a meal. 02/27/22   Sherron Monday, NP  rosuvastatin (CRESTOR) 40 MG tablet TAKE 1 TABLET DAILY 01/23/22   Arnoldo Lenis, MD  triamcinolone ointment (KENALOG) 0.1 % Apply 1 Application topically 2 (two) times daily as needed. 11/25/21   Valentina Shaggy, MD    Physical Exam: BP (!) 171/71   Pulse (!) 103   Temp 98.8 F (37.1 C) (Oral)   Resp 19   SpO2 96%   General: 66 y.o. year-old female well developed well nourished in no acute distress.  Alert and oriented x3. HEENT: NCAT, EOMI Neck: Supple, trachea medial Cardiovascular: Tachycardia.  Regular rate and rhythm with no rubs or gallops.  No thyromegaly or JVD noted.  No lower extremity edema. 2/4 pulses in all 4 extremities. Respiratory: Clear to auscultation with no wheezes or rales.  Good inspiratory effort. Abdomen: Soft, tender to palpation of RLQ with guarding. Muskuloskeletal: No cyanosis, clubbing or edema noted bilaterally Neuro: CN II-XII intact, strength 5/5 x 4, sensation, reflexes intact Skin: No ulcerative lesions noted or rashes Psychiatry: Judgement and insight appear normal. Mood is appropriate for condition and setting          Labs on Admission:  Basic Metabolic Panel: Recent Labs  Lab 03/14/22 2020  NA 136  K 3.9  CL 102  CO2 24  GLUCOSE 98  BUN 9  CREATININE 0.79  CALCIUM 9.0   Liver Function Tests: Recent Labs  Lab 03/14/22 2020  AST 14*  ALT 15  ALKPHOS 78  BILITOT 0.6  PROT 7.8  ALBUMIN 3.4*   Recent Labs  Lab 03/14/22 2020  LIPASE 30   No results for input(s): "AMMONIA" in the last 168 hours. CBC: Recent Labs  Lab 03/14/22 2020  WBC 15.9*  HGB 12.1  HCT 35.6*  MCV 86.8  PLT 370   Cardiac Enzymes: No results for input(s): "CKTOTAL", "CKMB", "CKMBINDEX", "TROPONINI" in the last 168 hours.  BNP (last 3 results) Recent Labs    10/11/21 1703  BNP 39.0    ProBNP (last 3 results) No results for input(s): "PROBNP" in the last 8760 hours.  CBG: No results for input(s): "GLUCAP" in the last 168 hours.  Radiological Exams on Admission: CT ABDOMEN PELVIS WO CONTRAST  Result Date: 03/14/2022 CLINICAL DATA:  Abdominal pain, acute, nonlocalized History of mesenteric ischemia per tickly at the [facility] but patient has severe contrast allergy, we are doing this as a screening. EXAM: CT ABDOMEN AND PELVIS WITHOUT CONTRAST TECHNIQUE: Multidetector CT imaging of the abdomen and pelvis was performed following the standard protocol without IV contrast. RADIATION DOSE REDUCTION: This exam was performed according to the departmental dose-optimization program which includes automated exposure control, adjustment of the mA and/or kV according to patient size and/or use of iterative reconstruction technique. COMPARISON:  None Available.  FINDINGS:  Lower chest: Coronary artery calcification. Hepatobiliary: No focal liver abnormality. Status post cholecystectomy. No biliary dilatation. Pancreas: No focal lesion. Normal pancreatic contour. No surrounding inflammatory changes. No main pancreatic ductal dilatation. Spleen: Normal in size without focal abnormality. Adrenals/Urinary Tract: No adrenal nodule bilaterally. No nephrolithiasis and no hydronephrosis. No definite contour-deforming renal mass. No ureterolithiasis or hydroureter. The urinary bladder is unremarkable. Stomach/Bowel: Stomach is within normal limits. No evidence of bowel wall thickening or dilatation. Marked inflammatory changes in the region of the appendiceal tip. The appendiceal base is enlarged in caliber up to 1.3 cm with associated periappendiceal fat stranding. No appendicolith identified. No definite organized fluid collection. Vascular/Lymphatic: Limited evaluation of a superior mesenteric origin stent. No abdominal aorta or iliac aneurysm. Mild atherosclerotic plaque of the aorta and its branches. No abdominal, pelvic, or inguinal lymphadenopathy. Reproductive: Lobulated uterine contour with multiple mass lesions, some of which are coarsely calcified. Bilateral adnexal regions are unremarkable. Other: No intraperitoneal free fluid. No intraperitoneal free gas. No organized fluid collection. Musculoskeletal: No abdominal wall hernia or abnormality. No suspicious lytic or blastic osseous lesions. No acute displaced fracture. Multilevel degenerative changes of the spine with intervertebral disc space vacuum phenomenon. IMPRESSION: 1. Findings suggestive of perforated acute appendicitis with markedly limited evaluation on this noncontrast study. Recommend surgical consultation. 2. Degenerative uterine fibroids. 3. Limited evaluation of a superior mesenteric origin stent on this noncontrast study. 4.  Aortic Atherosclerosis (ICD10-I70.0). Electronically Signed   By: Iven Finn M.D.   On: 03/14/2022 22:03    EKG: I independently viewed the EKG done and my findings are as followed: Normal sinus rhythm at a rate of 93 bpm  Assessment/Plan Present on Admission:  Acute perforated appendicitis  Mesenteric ischemia ([place])  Essential hypertension  Principal Problem:   Acute perforated appendicitis Active Problems:   Mesenteric ischemia ([place])   Essential hypertension   Leukocytosis   Mixed hyperlipidemia   Acquired hypothyroidism   Hypoalbuminemia due to protein-calorie malnutrition ([place])  Acute perforated appendicitis Continue IV NS at 100 mLs/Hr Continue IV Zosyn  Continue IV Dilaudid 0.5 mg q.4h p.r.n. for moderate to severe pain Continue IV Zofran p.r.n. Continue n.p.o. in anticipation for possible surgical intervention General surgery (Dr. Thermon Leyland) was consulted and recommended admitting patient to [facility] with plan to follow-up on patient on arrival to [facility]  Leukocytosis possibly secondary to above vs reactive Continue treatment as described above  Mesenteric ischemia Patient follows with Dr. Serafina Royals IR was consulted by EDP  Hypoalbuminemia possibly secondary to mild protein calorie malnutrition Albumin 3.4, consider protein supplement when patient resumes oral intake  Essential hypertension Continue IV hydralazine 10 mg every 6 hours as needed for SBP > 170 Continue home meds when patient resumes oral intake  Mixed hyperlipidemia Continue home meds when patient resumes oral intake  Acquired hypothyroidism Continue home meds when patient resumes oral intake  GERD Continue Protonix  Obesity (BMI 37.83) Diet and lifestyle modification  DVT prophylaxis: SCDs  Code Status: Full code  Family Communication: None at bedside  Consults: General surgery, IR (by [facility] EDP)  Severity of Illness: The appropriate patient status for this patient is INPATIENT. Inpatient status is judged to be reasonable and necessary in order to provide  the required intensity of service to ensure the patient's safety. The patient's presenting symptoms, physical exam findings, and initial radiographic and laboratory data in the context of their chronic comorbidities is felt to place them at high risk for further clinical deterioration. Furthermore, it is not  anticipated that the patient will be medically stable for discharge from the hospital within 2 midnights of admission.   * I certify that at the point of admission it is my clinical judgment that the patient will require inpatient hospital care spanning beyond 2 midnights from the point of admission due to high intensity of service, high risk for further deterioration and high frequency of surveillance required.*  Author: Bernadette Hoit, DO 03/15/2022 4:24 AM  For on call review [email or web address].

## 2022-03-14 NOTE — ED Triage Notes (Signed)
Pt's GI doctor told patient to go to the ED due to the increase abdominal pain, pt has a history mesenteric ischemia

## 2022-03-14 NOTE — ED Provider Notes (Signed)
[facility] EMERGENCY DEPARTMENT Provider Note   CSN: [id number] Arrival date & time: 03/14/22  1545     History {Add pertinent medical, surgical, social history, OB history to HPI:1} Chief Complaint  Patient presents with   Abdominal Pain    Michelle Patton is a 66 y.o. female.  Patient sent in from Dr. Roseanne Kaufman office for mesenteric ischemia.       Home Medications Prior to Admission medications   Medication Sig Start Date End Date Taking? Authorizing Provider  aspirin EC 81 MG tablet Take 1 tablet (81 mg total) by mouth daily. Swallow whole. 12/16/19   Arnoldo Lenis, MD  Cholecalciferol (DIALYVITE VITAMIN D 5000) 125 MCG (5000 UT) capsule Take 5,000 Units by mouth daily.    [provider]  clopidogrel (PLAVIX) 75 MG tablet Take 1 tablet (75 mg total) by mouth daily. 07/13/21   Theresa Duty, NP  diclofenac (VOLTAREN) 75 MG EC tablet TAKE 1 TABLET TWICE A DAY Patient taking differently: Take 75 mg by mouth 2 (two) times daily. 03/03/21   Carole Civil, MD  dicyclomine (BENTYL) 10 MG capsule Take 1 capsule (10 mg total) by mouth every 8 (eight) hours as needed for spasms. For left-sided discomfort and frequent stools. Watch for constipation, dry mouth, dizziness. 12/06/21   Annitta Needs, NP  estradiol (ESTRACE) 2 MG tablet Take 1 tablet (2 mg total) by mouth daily. 01/19/21   Wendie Agreste, MD  ezetimibe (ZETIA) 10 MG tablet Take 1 tablet (10 mg total) by mouth daily. 01/13/22 01/08/23  Dunn, Nedra Hai, PA-C  Flaxseed, Linseed, (FLAX SEED OIL PO) Take 1 capsule by mouth daily.    [provider]  hydrocortisone (ANUSOL-HC) 2.5 % rectal cream Place 1 Application rectally 2 (two) times daily. As needed for rectal bleeding. 12/06/21   Annitta Needs, NP  losartan (COZAAR) 25 MG tablet TAKE 1 TABLET BY MOUTH DAILY Patient taking differently: Take 25 mg by mouth daily. 06/27/21   Wendie Agreste, MD  metoprolol tartrate (LOPRESSOR) 25 MG tablet TAKE 1  TABLET TWICE A DAY 02/24/22   Arnoldo Lenis, MD  Multiple Minerals (CALCIUM-MAGNESIUM-ZINC) TABS Take 1 tablet by mouth daily.    [provider]  Multiple Vitamin (MULTIVITAMIN WITH MINERALS) TABS tablet Take 1 tablet by mouth daily.    [provider]  NP THYROID 120 MG tablet Take 120 mg by mouth every morning. 01/11/22   [provider]  progesterone (PROMETRIUM) 200 MG capsule Take 2 capsules (400 mg total) by mouth daily. 01/19/21 12/06/21  Wendie Agreste, MD  RABEprazole (ACIPHEX) 20 MG tablet Take 1 tablet (20 mg total) by mouth 2 (two) times daily before a meal. 02/27/22   Sherron Monday, NP  rosuvastatin (CRESTOR) 40 MG tablet TAKE 1 TABLET DAILY 01/23/22   Arnoldo Lenis, MD  triamcinolone ointment (KENALOG) 0.1 % Apply 1 Application topically 2 (two) times daily as needed. 11/25/21   Valentina Shaggy, MD      Allergies    Iodinated contrast media and Sulfa antibiotics    Review of Systems   Review of Systems  Physical Exam Updated Vital Signs BP 135/75 (BP Location: Right Arm)   Pulse 89   Temp 98.4 F (36.9 C) (Oral)   Resp 19   SpO2 100%  Physical Exam  ED Results / Procedures / Treatments   Labs (all labs ordered are listed, but only abnormal results are displayed) Labs Reviewed  URINALYSIS,  ROUTINE W REFLEX MICROSCOPIC - Abnormal; Notable for the following components:      Result Value   Ketones, ur 20 (*)    Protein, ur 30 (*)    Bacteria, UA FEW (*)    All other components within normal limits  LIPASE, BLOOD  COMPREHENSIVE METABOLIC PANEL  CBC    EKG None  Radiology No results found.  Procedures Procedures  {Document cardiac monitor, telemetry assessment procedure when appropriate:1}  Medications Ordered in ED Medications - No data to display  ED Course/ Medical Decision Making/ A&P                           Medical Decision Making Amount and/or Complexity of Data Reviewed Labs: ordered. Radiology:  ordered.  Risk Prescription drug management. Decision regarding hospitalization.   ***  {Document critical care time when appropriate:1} {Document review of labs and clinical decision tools ie heart score, Chads2Vasc2 etc:1}  {Document your independent review of radiology images, and any outside records:1} {Document your discussion with family members, caretakers, and with consultants:1} {Document social determinants of health affecting pt's care:1} {Document your decision making why or why not admission, treatments were needed:1} Final Clinical Impression(s) / ED Diagnoses Final diagnoses:  None    Rx / DC Orders ED Discharge Orders     None

## 2022-03-14 NOTE — ED Notes (Signed)
Transported to CT. B Tameika Heckmann RN

## 2022-03-14 NOTE — Progress Notes (Signed)
Gastroenterology Office Note     Primary Care Physician:  Wendie Agreste, MD  Primary Gastroenterologist: Dr. Gala Romney    Chief Complaint   Chief Complaint  Patient presents with   Follow-up    Pt here for her 3 month follow up     History of Present Illness   Michelle Patton is a 66 y.o. female presenting today in follow-up with a history of  abdominal pain, CDiff, found to have chronic mesenteric ischemia in work-up through our office. She underwent superior mesenteric artery covered stent placement 10/29/20 and repeat imaging 06/09/21  showed the stent to be occluded. She presented to IR 07/12/21 for an elective mesenteric and lower extremity angiogram with SMA stent recanalization and balloon angioplasty of the proximal SMA. In interim from last appointment in May 2023, she had an anaphylactic reaction to contrast media at time of CTA and was hospitalized.   Returns today in close follow-up. She had a telephone visit with Dr. Serafina Royals Nov 9th, 2023. At that time, plans for angiogram in the future as she had continued abdominal pain intermittently, suspicious for re-stenosis of SMA stent and/or continued stenosis of celiac artery stenosis. At that visit, her pain was intermittent.  She now notes persistent, constant pain for the past 2-3 weeks. Pain with movement getting into the bed, getting into the car. Not eating as afraid to eat as pain is worse postprandially.   Pain starts in RLQ and radiates up side, across upper abdomen, and down left side. Tried heating pad. Agony with yogurt.   Drinking only liquids: [place], ginger ale. 1 cigarette over the day. Pain feels similar to last year prior to stenting. She is in tears today, stating she is tired of dealing with the pain.   Vitals stable. She has lost about 4 lbs since Aug when last seen.   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   Redo sternotomy for 21 mm Medtronic Freestyle porcine aortic root graft w/ reimplantation of left main and right coronary arteries   Sleep apnea    diagnosed multiple years ago at [facility] aortic stenosis, congenital - s/p repair during childhood     Past Surgical History:  Procedure Laterality Date   ABDOMINAL AORTAGRAM  06/24/2012   ABDOMINAL AORTAGRAM N/A 06/24/2012   Procedure: ABDOMINAL Maxcine Ham;  Surgeon: Angelia Mould, MD;  Location: [facility] CATH LAB;  Service: Cardiovascular;  Laterality: N/A;   AORTIC VALVE REPLACEMENT N/A 10/07/2014   Procedure: REDO AORTIC VALVE REPLACEMENT (AVR);  Surgeon: Rexene Alberts, MD;  Location: [place];  Service: Open Heart Surgery;  Laterality: N/A;   ASCENDING AORTIC ROOT REPLACEMENT N/A 10/07/2014   Procedure: ASCENDING AORTIC ROOT REPLACEMENT;  Surgeon: Rexene Alberts, MD;  Location: [place];  Service: Open Heart Surgery;  Laterality: N/A;   BIOPSY  09/27/2016   Procedure: BIOPSY;  Surgeon: Daneil Dolin, MD;  [facility];  Service: Endoscopy;;  colon   BIOPSY  10/18/2020   Procedure: BIOPSY;  Surgeon: Eloise Harman, DO;  [facility];  Service: Endoscopy;;   BREAST REDUCTION SURGERY Bilateral 01/21/2018   Procedure: BREAST REDUCTION WITH LIPOSUCTION;  Surgeon: Cristine Polio, MD;  Location: [place];  Service: Plastics;  Laterality: Bilateral;   CARDIAC VALVE SURGERY  1968   CARPAL TUNNEL RELEASE Right 03/02/2020   Procedure: CARPAL TUNNEL RELEASE;  Surgeon: Carole Civil, MD;  Location: [facility];  Service: Orthopedics;  Laterality: Right;   CATARACT EXTRACTION W/PHACO Left 05/30/2019   Procedure: CATARACT EXTRACTION PHACO AND INTRAOCULAR LENS PLACEMENT (IOC) (CDE: 4.94  );  Surgeon: Baruch Goldmann, MD;  Location: [facility];  Service: Ophthalmology;  Laterality: Left;   CATARACT EXTRACTION W/PHACO Right 06/16/2019   Procedure: CATARACT EXTRACTION PHACO AND INTRAOCULAR LENS PLACEMENT (IOC);  Surgeon: Baruch Goldmann, MD;  [facility]: [facility];  Service: Ophthalmology;  Laterality: Right;  CDE: 4.64   CERVICAL FUSION     CHOLECYSTECTOMY     COLONOSCOPY WITH PROPOFOL N/A 09/27/2016   Dr. Gala Romney: Diverticulosis, several tubular adenomas removed ranging 4 to 7 mm in size, internal grade 1 hemorrhoids, terminal ileum normal, segmental biopsies negative for microscopic colitis.  Next colonoscopy June 2021   COLONOSCOPY WITH PROPOFOL N/A 05/06/2020   internal hemorrhoids, sigmoid diverticulosis. Surveillance colonoscopy due in 2027   ESOPHAGOGASTRODUODENOSCOPY (EGD) WITH PROPOFOL N/A 09/27/2016   Dr. Gala Romney: Small hiatal hernia, mild Schatzki ring status post disruption, LA grade a esophagitis   ESOPHAGOGASTRODUODENOSCOPY (EGD) WITH PROPOFOL N/A 10/18/2020   non-obstructing mild Schatzki ring, gastritis s/ biopsy. Negative H.pylori.   ILIAC ARTERY STENT Left 12/2007   IR ANGIOGRAM EXTREMITY BILATERAL  07/12/2021   IR ANGIOGRAM VISCERAL SELECTIVE  10/29/2020   IR ANGIOGRAM VISCERAL SELECTIVE  07/12/2021   IR IVUS EACH ADDITIONAL NON CORONARY VESSEL  07/12/2021   IR RADIOLOGIST EVAL & MGMT  10/26/2020   IR RADIOLOGIST EVAL & MGMT  11/10/2020   IR RADIOLOGIST EVAL & MGMT  02/09/2021   IR RADIOLOGIST EVAL & MGMT  06/16/2021   IR RADIOLOGIST EVAL & MGMT  08/15/2021   IR RADIOLOGIST EVAL & MGMT  10/18/2021   IR RADIOLOGIST EVAL & MGMT  01/11/2022   IR RADIOLOGIST EVAL & MGMT  03/02/2022   IR THROMBECT SEC MECH MOD SED  07/12/2021   IR TRANSCATH PLC STENT 1ST ART NOT LE CV CAR  VERT CAR  10/29/2020   IR TRANSCATH PLC STENT 1ST ART NOT LE CV CAR VERT CAR  07/12/2021   IR US GUIDE VASC ACCESS RIGHT  10/29/2020   IR US GUIDE VASC ACCESS RIGHT  07/12/2021   KNEE ARTHROSCOPY WITH MEDIAL MENISECTOMY Right 01/10/2018   Procedure: RIGHT KNEE ARTHROSCOPY WITH PARTIAL MEDIAL MENISECTOMY;  Surgeon: Carole Civil, MD;  [facility];  Service: Orthopedics;  Laterality: Right;   LEFT AND RIGHT HEART CATHETERIZATION WITH CORONARY ANGIOGRAM N/A 07/31/2014   Procedure: LEFT AND RIGHT HEART CATHETERIZATION WITH CORONARY ANGIOGRAM;  Surgeon: Burnell Blanks, MD;  Location[facility] CATH LAB;  Service: Cardiovascular;  Laterality: N/A;   MALONEY DILATION N/A 09/27/2016   Procedure: Venia Minks DILATION;  Surgeon: Daneil Dolin, MD;  [facility];  Service: Endoscopy;  Laterality: N/A;   POLYPECTOMY  09/27/2016   Procedure: POLYPECTOMY;  Surgeon: Daneil Dolin, MD;  [facility];  Service: Endoscopy;;  colon   ROTATOR CUFF REPAIR Bilateral  TEE WITHOUT CARDIOVERSION N/A 07/07/2014   Procedure: TRANSESOPHAGEAL ECHOCARDIOGRAM (TEE);  Surgeon: Arnoldo Lenis, MD;  [facility];  Service: Cardiology;  Laterality: N/A;   TEE WITHOUT CARDIOVERSION N/A 07/20/2014   Procedure: TRANSESOPHAGEAL ECHOCARDIOGRAM (TEE) WITH PROPOFOL;  Surgeon: Arnoldo Lenis, MD;  [facility];  Service: Endoscopy;  Laterality: N/A;   TEE WITHOUT CARDIOVERSION N/A 10/07/2014   Procedure: TRANSESOPHAGEAL ECHOCARDIOGRAM (TEE);  Surgeon: Rexene Alberts, MD;  Location: [place];  Service: Open Heart Surgery;  Laterality: N/A;    Current Outpatient Medications  Medication Sig Dispense Refill   aspirin EC 81 MG tablet Take 1 tablet (81 mg total) by mouth daily. Swallow whole. 90 tablet 3   Cholecalciferol (DIALYVITE VITAMIN D 5000) 125 MCG (5000 UT) capsule Take 5,000 Units by mouth daily.     clopidogrel (PLAVIX) 75 MG tablet Take 1 tablet (75 mg total) by mouth daily.  90 tablet 3   diclofenac (VOLTAREN) 75 MG EC tablet TAKE 1 TABLET TWICE A DAY (Patient taking differently: Take 75 mg by mouth 2 (two) times daily.) 60 tablet 11   dicyclomine (BENTYL) 10 MG capsule Take 1 capsule (10 mg total) by mouth every 8 (eight) hours as needed for spasms. For left-sided discomfort and frequent stools. Watch for constipation, dry mouth, dizziness. 90 capsule 1   estradiol (ESTRACE) 2 MG tablet Take 1 tablet (2 mg total) by mouth daily. 30 tablet 3   ezetimibe (ZETIA) 10 MG tablet Take 1 tablet (10 mg total) by mouth daily. 90 tablet 3   Flaxseed, Linseed, (FLAX SEED OIL PO) Take 1 capsule by mouth daily.     hydrocortisone (ANUSOL-HC) 2.5 % rectal cream Place 1 Application rectally 2 (two) times daily. As needed for rectal bleeding. 30 g 1   losartan (COZAAR) 25 MG tablet TAKE 1 TABLET BY MOUTH DAILY (Patient taking differently: Take 25 mg by mouth daily.) 90 tablet 3   metoprolol tartrate (LOPRESSOR) 25 MG tablet TAKE 1 TABLET TWICE A DAY 180 tablet 3   Multiple Minerals (CALCIUM-MAGNESIUM-ZINC) TABS Take 1 tablet by mouth daily.     Multiple Vitamin (MULTIVITAMIN WITH MINERALS) TABS tablet Take 1 tablet by mouth daily.     NP THYROID 120 MG tablet Take 120 mg by mouth every morning.     RABEprazole (ACIPHEX) 20 MG tablet Take 1 tablet (20 mg total) by mouth 2 (two) times daily before a meal. 180 tablet 3   rosuvastatin (CRESTOR) 40 MG tablet TAKE 1 TABLET DAILY 90 tablet 3   triamcinolone ointment (KENALOG) 0.1 % Apply 1 Application topically 2 (two) times daily as needed. 454 g 0   benzonatate (TESSALON PERLES) 100 MG capsule 1-2 capsules up to twice daily as needed for cough. (Patient not taking: Reported on 03/14/2022) 30 capsule 0   progesterone (PROMETRIUM) 200 MG capsule Take 2 capsules (400 mg total) by mouth daily. 180 capsule 1   No current facility-administered medications for this visit.    Allergies as of 03/14/2022 - Review Complete 03/14/2022  Allergen  Reaction Noted   Iodinated contrast media Anaphylaxis, Shortness Of Breath, Itching, Swelling, and Other (See Comments) 10/11/2021   Sulfa antibiotics Nausea And Vomiting 06/19/2012    Family History  Problem Relation Age of Onset   Hypertension Mother    Hypertension Father    Heart disease Father        before age 19   Other Father        varicose veins  COPD Father    Colon cancer Neg Hx    Celiac disease Neg Hx    Inflammatory bowel disease Neg Hx     Social History   Socioeconomic History   Marital status: Married    Spouse name: Not on file   Number of children: 0   Years of education: some coll   Highest education level: Not on file  Occupational History   Occupation: call center    Employer: [employer]    Comment: [employer]  Tobacco Use   Smoking status: Former    Packs/day: 0.50    Years: 40.00    Total pack years: 20.00    Types: Cigarettes    Quit date: 05/2021    Years since quitting: 0.8   Smokeless tobacco: Never  Vaping Use   Vaping Use: Never used  Substance and Sexual Activity   Alcohol use: Yes    Alcohol/week: 2.0 standard drinks of alcohol    Types: 2 Cans of beer per week   Drug use: No   Sexual activity: Yes    Birth control/protection: None  Other Topics Concern   Not on file  Social History Narrative   Patient is married   for 5 years . Patient works at [employer].. Some college education-did not Writer.Originally from [place].   Social Determinants of Health   Financial Resource Strain: Not on file  Food Insecurity: Not on file  Transportation Needs: Not on file  Physical Activity: Not on file  Stress: Not on file  Social Connections: Not on file  Intimate Partner Violence: Not on file     Review of Systems   See HPI   Physical Exam   BP 132/82   Pulse 89   Temp 97.6 F (36.4 C)   Ht '5\' 4"'$  (1.626 m)   Wt 220 lb 6.4 oz (100 kg)   BMI 37.83 kg/m  General:   Alert and oriented. Pleasant and  cooperative. Tearful at times.  Head:  Normocephalic and atraumatic. Eyes:  Without icterus Abdomen:  +BS, soft, TTP diffusely, and non-distended. No HSM noted. No guarding or rebound. No masses appreciated.  Rectal:  Deferred  Msk:  Symmetrical without gross deformities. Normal posture. Extremities:  Without edema. Neurologic:  Alert and  oriented x4;  grossly normal neurologically. Skin:  Intact without significant lesions or rashes. Psych:  Alert and cooperative. Normal mood and affect.   Assessment/Plan:   Michelle Patton is a 67 y.o. female presenting today in follow-up with a history of chronic mesenteric ischemia s/p superior mesenteric artery covered stent placement 10/29/20 and repeat imaging 06/09/21  showed the stent to be occluded. She presented to IR 07/12/21 for an elective mesenteric and lower extremity angiogram with SMA stent recanalization and balloon angioplasty of the proximal SMA. In interim from last appointment in May 2023, she had an anaphylactic reaction to contrast media at time of CTA and was hospitalized.   She has had ongoing vague abdominal pain since earlier this year. However, over past 2-3 weeks, pain has become constant, worsened postprandially, and difficult to tolerate solids/soft foods. She has lost 4 lbs since Aug 2023 at last visit, and she reports fear of eating and food aversion.   She has kept in close contact with Vascular, and last visit was via telephone on Nov 9th. Plans had been for angiogram in future. She is difficult to image due to IV contrast anaphylaxis and poor sonographic window previously.   Vitals are stable.  I do not feel we are dealing with an acute event; however, I am quite concerned now with her persistent pain and progressive symptoms. Patient has a notable high tolerance for pain but is in tears and stating she can not continue like this; she notes this is affecting her job now. At this point, I have limited options to offer, and I  recommended evaluation at the ED. She is agreeable to this; however, she wants to go home to see her dog first. She is fully aware of the risks of delaying care, as we have discussed this at each visit and the seriousness of the disease process.   She will present to the ED. Significant other is present with her.       Annitta Needs, PhD, ANP-BC [facility] Gastroenter[facility]

## 2022-03-15 ENCOUNTER — Encounter: Payer: Self-pay | Admitting: Gastroenterology

## 2022-03-15 ENCOUNTER — Inpatient Hospital Stay (HOSPITAL_COMMUNITY): Payer: Medicare Other

## 2022-03-15 ENCOUNTER — Encounter (HOSPITAL_COMMUNITY): Payer: Self-pay | Admitting: Internal Medicine

## 2022-03-15 DIAGNOSIS — K3532 Acute appendicitis with perforation and localized peritonitis, without abscess: Secondary | ICD-10-CM | POA: Diagnosis not present

## 2022-03-15 DIAGNOSIS — E039 Hypothyroidism, unspecified: Secondary | ICD-10-CM | POA: Insufficient documentation

## 2022-03-15 DIAGNOSIS — E782 Mixed hyperlipidemia: Secondary | ICD-10-CM | POA: Insufficient documentation

## 2022-03-15 DIAGNOSIS — K55059 Acute (reversible) ischemia of intestine, part and extent unspecified: Secondary | ICD-10-CM | POA: Diagnosis not present

## 2022-03-15 DIAGNOSIS — Z6834 Body mass index (BMI) 34.0-34.9, adult: Secondary | ICD-10-CM | POA: Insufficient documentation

## 2022-03-15 DIAGNOSIS — E669 Obesity, unspecified: Secondary | ICD-10-CM | POA: Insufficient documentation

## 2022-03-15 DIAGNOSIS — D72829 Elevated white blood cell count, unspecified: Secondary | ICD-10-CM | POA: Insufficient documentation

## 2022-03-15 DIAGNOSIS — E46 Unspecified protein-calorie malnutrition: Secondary | ICD-10-CM | POA: Insufficient documentation

## 2022-03-15 LAB — MAGNESIUM: Magnesium: 1.8 mg/dL (ref 1.7–2.4)

## 2022-03-15 LAB — COMPREHENSIVE METABOLIC PANEL
ALT: 14 U/L (ref 0–44)
AST: 14 U/L — ABNORMAL LOW (ref 15–41)
Albumin: 2.8 g/dL — ABNORMAL LOW (ref 3.5–5.0)
Alkaline Phosphatase: 68 U/L (ref 38–126)
Anion gap: 12 (ref 5–15)
BUN: 7 mg/dL — ABNORMAL LOW (ref 8–23)
CO2: 21 mmol/L — ABNORMAL LOW (ref 22–32)
Calcium: 8.5 mg/dL — ABNORMAL LOW (ref 8.9–10.3)
Chloride: 104 mmol/L (ref 98–111)
Creatinine, Ser: 0.97 mg/dL (ref 0.44–1.00)
GFR, Estimated: >60 mL/min (ref >60)
Glucose, Bld: 107 mg/dL — ABNORMAL HIGH (ref 70–99)
Potassium: 3.9 mmol/L (ref 3.5–5.1)
Sodium: 137 mmol/L (ref 135–145)
Total Bilirubin: 0.4 mg/dL (ref 0.3–1.2)
Total Protein: 6.5 g/dL (ref 6.5–8.1)

## 2022-03-15 LAB — CBC
HCT: 34.6 % — ABNORMAL LOW (ref 36.0–46.0)
Hemoglobin: 11.5 g/dL — ABNORMAL LOW (ref 12.0–15.0)
MCH: 29.3 pg (ref 26.0–34.0)
MCHC: 33.2 g/dL (ref 30.0–36.0)
MCV: 88 fL (ref 80.0–100.0)
Platelets: 360 10*3/uL (ref 150–400)
RBC: 3.93 MIL/uL (ref 3.87–5.11)
RDW: 11.7 % (ref 11.5–15.5)
WBC: 16.2 10*3/uL — ABNORMAL HIGH (ref 4.0–10.5)
nRBC: 0 % (ref 0.0–0.2)

## 2022-03-15 LAB — PHOSPHORUS: Phosphorus: 3.4 mg/dL (ref 2.5–4.6)

## 2022-03-15 LAB — HIV ANTIBODY (ROUTINE TESTING W REFLEX): HIV Screen 4th Generation wRfx: NONREACTIVE

## 2022-03-15 MED ORDER — PIPERACILLIN-TAZOBACTAM 3.375 G IVPB
3.3750 g | Freq: Three times a day (TID) | INTRAVENOUS | Status: DC
  Administered 2022-03-15 – 2022-03-22 (×21): 3.375 g via INTRAVENOUS
  Filled 2022-03-15 (×23): qty 50

## 2022-03-15 MED ORDER — PROGESTERONE 200 MG PO CAPS
400.0000 mg | ORAL_CAPSULE | Freq: Every day | ORAL | Status: DC
  Administered 2022-03-15 – 2022-03-22 (×3): 400 mg via ORAL
  Filled 2022-03-15 (×8): qty 2

## 2022-03-15 MED ORDER — THYROID 60 MG PO TABS
120.0000 mg | ORAL_TABLET | Freq: Every morning | ORAL | Status: DC
  Administered 2022-03-16 – 2022-03-22 (×2): 120 mg via ORAL
  Filled 2022-03-15 (×8): qty 2

## 2022-03-15 MED ORDER — METOPROLOL TARTRATE 25 MG PO TABS
25.0000 mg | ORAL_TABLET | Freq: Two times a day (BID) | ORAL | Status: DC
  Administered 2022-03-15 – 2022-03-16 (×3): 25 mg via ORAL
  Filled 2022-03-15 (×4): qty 1

## 2022-03-15 MED ORDER — PANTOPRAZOLE SODIUM 40 MG IV SOLR
40.0000 mg | Freq: Two times a day (BID) | INTRAVENOUS | Status: DC
  Administered 2022-03-15 – 2022-03-22 (×15): 40 mg via INTRAVENOUS
  Filled 2022-03-15 (×15): qty 10

## 2022-03-15 MED ORDER — ACETAMINOPHEN 325 MG PO TABS
650.0000 mg | ORAL_TABLET | Freq: Four times a day (QID) | ORAL | Status: DC | PRN
  Administered 2022-03-15 – 2022-03-16 (×3): 650 mg via ORAL
  Filled 2022-03-15 (×3): qty 2

## 2022-03-15 MED ORDER — PROCHLORPERAZINE EDISYLATE 10 MG/2ML IJ SOLN
10.0000 mg | Freq: Once | INTRAMUSCULAR | Status: AC
  Administered 2022-03-15: 10 mg via INTRAVENOUS
  Filled 2022-03-15: qty 2

## 2022-03-15 MED ORDER — HYDRALAZINE HCL 20 MG/ML IJ SOLN
10.0000 mg | Freq: Four times a day (QID) | INTRAMUSCULAR | Status: DC | PRN
  Administered 2022-03-16: 10 mg via INTRAVENOUS
  Filled 2022-03-15: qty 1

## 2022-03-15 MED ORDER — HYDROMORPHONE HCL 1 MG/ML IJ SOLN
0.5000 mg | INTRAMUSCULAR | Status: DC | PRN
  Administered 2022-03-15 (×5): 0.5 mg via INTRAVENOUS
  Filled 2022-03-15 (×5): qty 0.5

## 2022-03-15 MED ORDER — SODIUM CHLORIDE 0.9 % IV SOLN: INTRAVENOUS | Status: DC

## 2022-03-15 MED ORDER — ESTRADIOL 0.5 MG PO TABS
2.0000 mg | ORAL_TABLET | Freq: Every day | ORAL | Status: DC
  Administered 2022-03-15 – 2022-03-22 (×5): 2 mg via ORAL
  Filled 2022-03-15 (×9): qty 4

## 2022-03-15 MED ORDER — OXYCODONE HCL 5 MG PO TABS
5.0000 mg | ORAL_TABLET | ORAL | Status: DC | PRN
  Administered 2022-03-15 (×2): 5 mg via ORAL
  Filled 2022-03-15 (×2): qty 1

## 2022-03-15 MED ORDER — ONDANSETRON HCL 4 MG/2ML IJ SOLN
4.0000 mg | Freq: Four times a day (QID) | INTRAMUSCULAR | Status: DC | PRN
  Administered 2022-03-15 – 2022-03-19 (×8): 4 mg via INTRAVENOUS
  Filled 2022-03-15 (×8): qty 2

## 2022-03-15 MED ORDER — HYDROMORPHONE HCL 1 MG/ML IJ SOLN
0.5000 mg | INTRAMUSCULAR | Status: DC | PRN
  Administered 2022-03-15 – 2022-03-16 (×5): 0.5 mg via INTRAVENOUS
  Filled 2022-03-15 (×5): qty 0.5

## 2022-03-15 NOTE — Progress Notes (Signed)
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     She is status post SMA covered balloon-expandable stent placement on 10/29/20.    On follow up CTA in February, her stent was noted to be occluded.  At this time, she was asymptomatic, however between clinic visit and time of procedure she began to experience mild ischemic pain symptoms again.   On 07/12/21 she underwent successful SMA stent recanalization with laser atherectomy, placement of new 5x39 mm VBX.     She presented to the ED on 03/14/22 c/o persistent RLQ pain.   In the ED her WBC was 15.9 and CT scan was suggestive of perforated acute appendicitis.    Subjective: Lying in bed. Comfortable. General Surgeon at bedside discussing     his plan/recommendations.  Allergies: Iodinated contrast media and Sulfa antibiotics  Medications: Prior to Admission medications   Medication Sig Start Date End Date Taking? Authorizing Provider  acetaminophen (TYLENOL) 325 MG tablet Take 650 mg by mouth every 6 (six) hours as needed for mild pain, moderate pain, fever or headache.   Yes [provider]  aspirin EC 81 MG tablet Take 1 tablet (81 mg total) by mouth daily. Swallow whole. Patient taking differently: Take 81 mg by mouth in the morning. 12/16/19  Yes Branch, Dorothe Pea, MD  Cholecalciferol (VITAMIN D-3 PO) Take 1 capsule by mouth in the morning.   Yes [provider]  clopidogrel (PLAVIX) 75 MG tablet Take 1 tablet (75 mg total) by mouth daily. Patient taking differently: Take 75 mg by mouth at bedtime. 07/13/21  Yes Covington, Asher Muir R, NP  diclofenac (VOLTAREN) 75 MG EC tablet TAKE 1 TABLET TWICE A DAY Patient taking differently: Take 75 mg by mouth 2 (two) times daily.  03/03/21  Yes Vickki Hearing, MD  estradiol (ESTRACE) 2 MG tablet Take 1 tablet (2 mg total) by mouth daily. Patient taking differently: Take 2 mg by mouth in the morning. 01/19/21  Yes Shade Flood, MD  ezetimibe (ZETIA) 10 MG tablet Take 1 tablet (10 mg total) by mouth daily. Patient taking differently: Take 10 mg by mouth in the morning. 01/13/22 01/08/23 Yes Dunn, Dayna N, PA-C  Flaxseed, Linseed, (FLAX SEED OIL PO) Take 1 capsule by mouth in the morning.   Yes [provider]  hydrocortisone (ANUSOL-HC) 2.5 % rectal cream Place 1 Application rectally 2 (two) times daily. As needed for rectal bleeding. Patient taking differently: Place 1 Application rectally 2 (two) times daily as needed for hemorrhoids (rectal bleeding). 12/06/21  Yes Gelene Mink, NP  losartan (COZAAR) 25 MG tablet TAKE 1 TABLET BY MOUTH DAILY Patient taking differently: Take 25 mg by mouth in the morning. 06/27/21  Yes Shade Flood, MD  metoprolol tartrate (LOPRESSOR) 25 MG tablet TAKE 1 TABLET TWICE A DAY 02/24/22  Yes Branch, Dorothe Pea, MD  Multiple Minerals (CALCIUM-MAGNESIUM-ZINC) TABS Take 1 tablet by mouth in the morning.   Yes [provider]  Multiple Vitamin (MULTIVITAMIN WITH MINERALS) TABS tablet Take 1 tablet by mouth in the morning.   Yes [provider]  NP THYROID 120 MG tablet Take 120 mg by mouth daily before breakfast. 01/11/22  Yes [provider]  progesterone (PROMETRIUM) 200 MG capsule Take  2 capsules (400 mg total) by mouth daily. Patient taking differently: Take 400 mg by mouth at bedtime. 01/19/21 04/29/22 Yes Shade Flood, MD  RABEprazole (ACIPHEX) 20 MG tablet Take 1 tablet (20 mg total) by mouth 2 (two) times daily before a meal. 02/27/22  Yes Mahon, Courtney L, NP  rosuvastatin (CRESTOR) 40 MG tablet TAKE 1 TABLET DAILY Patient taking differently: Take 40 mg by mouth at bedtime. 01/23/22  Yes BranchDorothe Pea, MD  triamcinolone ointment (KENALOG)  0.1 % Apply 1 Application topically 2 (two) times daily as needed. Patient taking differently: Apply 1 Application topically 2 (two) times daily as needed (irritation). 11/25/21  Yes Alfonse Spruce, MD  dicyclomine (BENTYL) 10 MG capsule Take 1 capsule (10 mg total) by mouth every 8 (eight) hours as needed for spasms. For left-sided discomfort and frequent stools. Watch for constipation, dry mouth, dizziness. Patient not taking: Reported on 03/15/2022 12/06/21   Gelene Mink, NP     Vital Signs: BP (!) 144/59 (BP Location: Right Arm)   Pulse 90   Temp 99.6 F (37.6 C) (Oral)   Resp 18   SpO2 94%   Physical Exam Vitals reviewed.  Constitutional:      Appearance: Normal appearance.  Cardiovascular:     Rate and Rhythm: Normal rate.  Pulmonary:     Effort: Pulmonary effort is normal. No respiratory distress.  Neurological:     General: No focal deficit present.     Mental Status: She is alert and oriented to person, place, and time.  Psychiatric:        Mood and Affect: Mood normal.        Behavior: Behavior normal.        Thought Content: Thought content normal.        Judgment: Judgment normal.     Imaging: VAS [place] MESENTERIC  Result Date: 03/15/2022 ABDOMINAL VISCERAL Patient Name:  Michelle Patton  Date of Exam:   03/15/2022 Medical Rec #: [id number]         Accession #:    [id number] Date of Birth: 1955-12-04        Patient Gender: F Patient Age:   89 years Exam Location:  [facility] Procedure:      VAS [place] MESENTERIC Referring Phys: [id number] Carollee Herter A [place] -------------------------------------------------------------------------------- Indications: History of mesenteric ischemia with SMA stent, new onset abdominal              pain High Risk Factors: Hypertension, hyperlipidemia, past history of smoking. Limitations: Air/bowel gas and obesity. Comparison Study: 01-13-2022 Prior Mesenteric Artery Ultrasound showed mid-SMA                   stent velocities of  320 cm/s Performing Technologist: Jean Rosenthal RDMS, RVT  Examination Guidelines: A complete evaluation includes B-mode imaging, spectral Doppler, color Doppler, and power Doppler as needed of all accessible portions of each vessel. Bilateral testing is considered an integral part of a complete examination. Limited examinations for reoccurring indications may be performed as noted.  Duplex Findings: +----------------------+--------+--------+------+--------+ Mesenteric            PSV cm/sEDV cm/sPlaqueComments +----------------------+--------+--------+------+--------+ Aorta Mid               116                          +----------------------+--------+--------+------+--------+ Celiac Artery Origin    132                          +----------------------+--------+--------+------+--------+  Celiac Artery Proximal  223                          +----------------------+--------+--------+------+--------+ SMA Origin              216                          +----------------------+--------+--------+------+--------+ SMA Proximal            176                          +----------------------+--------+--------+------+--------+ SMA Mid                 489                          +----------------------+--------+--------+------+--------+ SMA Distal               92                          +----------------------+--------+--------+------+--------+ CHA                      93                          +----------------------+--------+--------+------+--------+ Splenic                  88                          +----------------------+--------+--------+------+--------+ IMA                     129                          +----------------------+--------+--------+------+--------+    Summary: Mesenteric:  Today's examination was limited due to overlying bowel gas; however, celiac, IMA, and SMA all appear patent. Velocities of 489 cm/s at the mid-SMA segment suggest  some degree of in-stent stenosis.  *See table(s) above for measurements and observations.  Diagnosing physician: Sherald Hess MD  Electronically signed by Sherald Hess MD on 03/15/2022 at 12:44:04 PM.    Final    CT ABDOMEN PELVIS WO CONTRAST  Result Date: 03/14/2022 CLINICAL DATA:  Abdominal pain, acute, nonlocalized History of mesenteric ischemia per tickly at the SMA but patient has severe contrast allergy, we are doing this as a screening. EXAM: CT ABDOMEN AND PELVIS WITHOUT CONTRAST TECHNIQUE: Multidetector CT imaging of the abdomen and pelvis was performed following the standard protocol without IV contrast. RADIATION DOSE REDUCTION: This exam was performed according to the departmental dose-optimization program which includes automated exposure control, adjustment of the mA and/or kV according to patient size and/or use of iterative reconstruction technique. COMPARISON:  None Available. FINDINGS: Lower chest: Coronary artery calcification. Hepatobiliary: No focal liver abnormality. Status post cholecystectomy. No biliary dilatation. Pancreas: No focal lesion. Normal pancreatic contour. No surrounding inflammatory changes. No main pancreatic ductal dilatation. Spleen: Normal in size without focal abnormality. Adrenals/Urinary Tract: No adrenal nodule bilaterally. No nephrolithiasis and no hydronephrosis. No definite contour-deforming renal mass. No ureterolithiasis or hydroureter. The urinary bladder is unremarkable. Stomach/Bowel: Stomach is within normal limits. No evidence of bowel wall thickening or dilatation. Marked inflammatory changes in the region  of the appendiceal tip. The appendiceal base is enlarged in caliber up to 1.3 cm with associated periappendiceal fat stranding. No appendicolith identified. No definite organized fluid collection. Vascular/Lymphatic: Limited evaluation of a superior mesenteric origin stent. No abdominal aorta or iliac aneurysm. Mild atherosclerotic plaque of  the aorta and its branches. No abdominal, pelvic, or inguinal lymphadenopathy. Reproductive: Lobulated uterine contour with multiple mass lesions, some of which are coarsely calcified. Bilateral adnexal regions are unremarkable. Other: No intraperitoneal free fluid. No intraperitoneal free gas. No organized fluid collection. Musculoskeletal: No abdominal wall hernia or abnormality. No suspicious lytic or blastic osseous lesions. No acute displaced fracture. Multilevel degenerative changes of the spine with intervertebral disc space vacuum phenomenon. IMPRESSION: 1. Findings suggestive of perforated acute appendicitis with markedly limited evaluation on this noncontrast study. Recommend surgical consultation. 2. Degenerative uterine fibroids. 3. Limited evaluation of a superior mesenteric origin stent on this noncontrast study. 4.  Aortic Atherosclerosis (ICD10-I70.0). Electronically Signed   By: Tish Frederickson M.D.   On: 03/14/2022 22:03    Labs:  CBC: Recent Labs    12/29/21 1557 03/14/22 2020 03/15/22 0605 03/16/22 0259  WBC 9.1 15.9* 16.2* 20.3*  HGB 12.8 12.1 11.5* 11.9*  HCT 38.7 35.6* 34.6* 35.4*  PLT 222 370 360 365    COAGS: Recent Labs    07/12/21 1107 03/14/22 2020  INR 1.0 1.1    BMP: Recent Labs    10/11/21 1701 02/20/22 1626 03/14/22 2020 03/15/22 0605 03/16/22 0259  NA 135 138 136 137 135  K 4.3 4.5 3.9 3.9 4.4  CL 108 100 102 104 102  CO2 22 22 24  21* 19*  GLUCOSE 133* 99 98 107* 94  BUN 15 15 9  7* 8  CALCIUM 8.1* 9.3 9.0 8.5* 9.1  CREATININE 0.92 0.97 0.79 0.97 0.88  GFRNONAA >60  --  >60 >60 >60    LIVER FUNCTION TESTS: Recent Labs    10/11/21 1701 02/20/22 1626 03/14/22 2020 03/15/22 0605  BILITOT 0.2* 0.3 0.6 0.4  AST 20 16 14* 14*  ALT 16 13 15 14   ALKPHOS 43 70 78 68  PROT 5.6* 7.0 7.8 6.5  ALBUMIN 3.0* 4.1 3.4* 2.8*    Assessment and Plan:  Patient seen by Dr. Elby Showers. Surgeon at bedside as well.   Agree with his plan to treat  conservatively with IV antibiotics.  Will plan for mesenteric angiography with angioplasty by Dr. Elby Showers in the next couple of weeks as long as she is clinically improved.  Electronically Signed: Gwynneth Macleod, PA-C 03/16/2022, 8:14 AM    I spent a total of 15 Minutes at the the patient's bedside AND on the patient's hospital floor or unit, greater than 50% of which was counseling/coordinating care for planning mesenteric angioplasty.

## 2022-03-15 NOTE — Progress Notes (Signed)
Pt complaining of dry cough, worsening abdominal pain in RLQ, and a headache.  MD notified.  PRN medications administered.  Will continue to follow.

## 2022-03-15 NOTE — Progress Notes (Signed)
[facility] Progress Note  Patient: Michelle Patton     KVQ:[id number]  DOA: 03/14/2022   PCP: Wendie Agreste, MD       Brief hospital course: This is a 66 year old female with history of mesenteric ischemia, hyperlipidemia, hypertension, hypothyroidism who presents to the hospital for abdominal pain that has been present for about 1-1/2 weeks now.  The patient states that she has chronic abdominal pain however about a week and a half ago this became severe in her right lower abdomen and progressed to a point where she was unable to eat or drink.  She presented to the ED and was found to have perforated acute appendicitis on CT of the abdomen and pelvis, WBC count of 15.9 and was tachycardic. She was started on IV antibiotics and general surgery was consulted.  She was transferred from [facility] to [facility] for further evaluation by the surgical team.  Subjective:  She is  Assessment and Plan: Principal Problem:   Perforated appendicitis -She has been evaluated by general surgery today who feels that she may have a chronic process rather than an acute one and have recommended that we continue IV antibiotics- general surgery has recommended to hold  Active Problems:   Mesenteric ischemia - chronic issue- h/o SMA stenosis, stenting and balloon angioplasty    Essential hypertension - cont Metoprolol - holding Losartan    Leukocytosis - WBC count ~ 16     Acquired hypothyroidism - cont NP Thyroid    Hypoalbuminemia due to protein-calorie malnutrition (HCC) - Albumin 2.8      Code Status: Full Code DVT prophylaxis:  SCDs Start: 03/15/22 0437 Consultants: General surgery Level of Care: Level of care: Med-Surg Total time on patient care: 35   Objective:   Vitals:   03/15/22 0449 03/15/22 0750 03/15/22 1220 03/15/22 1518  BP: 135/84 128/68 131/81 (!) 141/64  Pulse: (!) 114 (!) 104 (!) 104 87  Resp:  '18 18 18  '$ Temp:  (!) 97.5 F (36.4 C)  98.3 F (36.8 C) 98.6 F (37 C)  TempSrc:  Oral Oral Oral  SpO2: 92% 95% 93% 95%   There were no vitals filed for this visit. Exam: General exam: Appears comfortable  HEENT: oral mucosa moist Respiratory system: Clear to auscultation.  Cardiovascular system: S1 & S2 heard  Gastrointestinal system: Abdomen soft,  tender in right abdomen,  mildly distended. Poor bowel sounds. Extremities: No cyanosis, clubbing or edema Psychiatry:  Mood & affect appropriate.      CBC: Recent Labs  Lab 03/14/22 2020 03/15/22 0605  WBC 15.9* 16.2*  HGB 12.1 11.5*  HCT 35.6* 34.6*  MCV 86.8 88.0  PLT 370 643   Basic Metabolic Panel: Recent Labs  Lab 03/14/22 2020 03/15/22 0605  NA 136 137  K 3.9 3.9  CL 102 104  CO2 24 21*  GLUCOSE 98 107*  BUN 9 7*  CREATININE 0.79 0.97  CALCIUM 9.0 8.5*  MG  --  1.8  PHOS  --  3.4   GFR: Estimated Creatinine Clearance: 66.5 mL/min (by C-G formula based on SCr of 0.97 mg/dL).  Scheduled Meds:  estradiol  2 mg Oral Daily   metoprolol tartrate  25 mg Oral BID   pantoprazole (PROTONIX) IV  40 mg Intravenous Q12H   progesterone  400 mg Oral QHS   [START ON 03/16/2022] thyroid  120 mg Oral q morning   Continuous Infusions:  sodium chloride 100 mL/hr at 03/15/22 1456  piperacillin-tazobactam (ZOSYN)  IV 12.5 mL/hr at 03/15/22 1456   Imaging and lab data was personally reviewed VAS [place] MESENTERIC  Result Date: 03/15/2022 ABDOMINAL VISCERAL Patient Name:  Michelle Patton  Date of Exam:   03/15/2022 Medical Rec #: [id number]         Accession #:    [id number] Date of Birth: 22-Dec-1955        Patient Gender: F Patient Age:   48 years Exam Location:  [facility] Procedure:      VAS [place] MESENTERIC Referring Phys: [id number] [place] -------------------------------------------------------------------------------- Indications: History of mesenteric ischemia with SMA stent, new onset abdominal              pain High Risk Factors:  Hypertension, hyperlipidemia, past history of smoking. Limitations: Air/bowel gas and obesity. Comparison Study: 01-13-2022 Prior Mesenteric Artery Ultrasound showed mid-SMA                   stent velocities of 320 cm/s Performing Technologist: Darlin Coco RDMS, RVT  Examination Guidelines: A complete evaluation includes B-mode imaging, spectral Doppler, color Doppler, and power Doppler as needed of all accessible portions of each vessel. Bilateral testing is considered an integral part of a complete examination. Limited examinations for reoccurring indications may be performed as noted.  Duplex Findings: +----------------------+--------+--------+------+--------+ Mesenteric            PSV cm/sEDV cm/sPlaqueComments +----------------------+--------+--------+------+--------+ Aorta Mid               116                          +----------------------+--------+--------+------+--------+ Celiac Artery Origin    132                          +----------------------+--------+--------+------+--------+ Celiac Artery Proximal  223                          +----------------------+--------+--------+------+--------+ SMA Origin              216                          +----------------------+--------+--------+------+--------+ SMA Proximal            176                          +----------------------+--------+--------+------+--------+ SMA Mid                 489                          +----------------------+--------+--------+------+--------+ SMA Distal               92                          +----------------------+--------+--------+------+--------+ CHA                      93                          +----------------------+--------+--------+------+--------+ Splenic                  88                          +----------------------+--------+--------+------+--------+  IMA                     129                           +----------------------+--------+--------+------+--------+    Summary: Mesenteric:  Today's examination was limited due to overlying bowel gas; however, celiac, IMA, and SMA all appear patent. Velocities of 489 cm/s at the mid-SMA segment suggest some degree of in-stent stenosis.  *See table(s) above for measurements and observations.  Diagnosing physician: Monica Martinez MD  Electronically signed by Monica Martinez MD on 03/15/2022 at 12:44:04 PM.    Final    CT ABDOMEN PELVIS WO CONTRAST  Result Date: 03/14/2022 CLINICAL DATA:  Abdominal pain, acute, nonlocalized History of mesenteric ischemia per tickly at the SMA but patient has severe contrast allergy, we are doing this as a screening. EXAM: CT ABDOMEN AND PELVIS WITHOUT CONTRAST TECHNIQUE: Multidetector CT imaging of the abdomen and pelvis was performed following the standard protocol without IV contrast. RADIATION DOSE REDUCTION: This exam was performed according to the departmental dose-optimization program which includes automated exposure control, adjustment of the mA and/or kV according to patient size and/or use of iterative reconstruction technique. COMPARISON:  None Available. FINDINGS: Lower chest: Coronary artery calcification. Hepatobiliary: No focal liver abnormality. Status post cholecystectomy. No biliary dilatation. Pancreas: No focal lesion. Normal pancreatic contour. No surrounding inflammatory changes. No main pancreatic ductal dilatation. Spleen: Normal in size without focal abnormality. Adrenals/Urinary Tract: No adrenal nodule bilaterally. No nephrolithiasis and no hydronephrosis. No definite contour-deforming renal mass. No ureterolithiasis or hydroureter. The urinary bladder is unremarkable. Stomach/Bowel: Stomach is within normal limits. No evidence of bowel wall thickening or dilatation. Marked inflammatory changes in the region of the appendiceal tip. The appendiceal base is enlarged in caliber up to 1.3 cm with associated  periappendiceal fat stranding. No appendicolith identified. No definite organized fluid collection. Vascular/Lymphatic: Limited evaluation of a superior mesenteric origin stent. No abdominal aorta or iliac aneurysm. Mild atherosclerotic plaque of the aorta and its branches. No abdominal, pelvic, or inguinal lymphadenopathy. Reproductive: Lobulated uterine contour with multiple mass lesions, some of which are coarsely calcified. Bilateral adnexal regions are unremarkable. Other: No intraperitoneal free fluid. No intraperitoneal free gas. No organized fluid collection. Musculoskeletal: No abdominal wall hernia or abnormality. No suspicious lytic or blastic osseous lesions. No acute displaced fracture. Multilevel degenerative changes of the spine with intervertebral disc space vacuum phenomenon. IMPRESSION: 1. Findings suggestive of perforated acute appendicitis with markedly limited evaluation on this noncontrast study. Recommend surgical consultation. 2. Degenerative uterine fibroids. 3. Limited evaluation of a superior mesenteric origin stent on this noncontrast study. 4.  Aortic Atherosclerosis (ICD10-I70.0). Electronically Signed   By: Iven Finn M.D.   On: 03/14/2022 22:03    LOS: 1 day   Author: Debbe Odea  03/15/2022 3:28 PM  To contact [facility]>   Check the care team in [facility] and look for the attending/consulting [facility] provider listed  Log into [email or web address] and use [facility]'s universal password   Go to> "[facility]"  and find provider  If you still have difficulty reaching the provider, please page the [facility] (Director on Call) for the Hospitalists listed on amion

## 2022-03-15 NOTE — Progress Notes (Signed)
Pharmacy Antibiotic Note  Michelle Patton is a 66 y.o. female admitted on 03/14/2022 with  intra-abdominal infection .  Pharmacy has been consulted for Zosyn dosing. WBC elevated. Renal function good.   Plan: Zosyn 3.375G IV q8h to be infused over 4 hours Trend WBC, temp, renal function   Temp (24hrs), Avg:98.4 F (36.9 C), Min:97.6 F (36.4 C), Max:98.8 F (37.1 C)  Recent Labs  Lab 03/14/22 2020 03/14/22 2106 03/14/22 2338  WBC 15.9*  --   --   CREATININE 0.79  --   --   LATICACIDVEN  --  1.0 0.8    Estimated Creatinine Clearance: 80.6 mL/min (by C-G formula based on SCr of 0.79 mg/dL).    Allergies  Allergen Reactions   Iodinated Contrast Media Anaphylaxis, Shortness Of Breath, Itching, Swelling and Other (See Comments)    Patient was given Omni 350 and suffered from itching, chest pain and shortness of breath. Patient was taken to ED after onset of reaction. 10/11/2021  MD zackowski noted pt had tongue and throat swelling, had to give pt epinephrine    Sulfa Antibiotics Nausea And Vomiting    Narda Bonds, PharmD, BCPS Clinical Pharmacist Phone: [phone]

## 2022-03-15 NOTE — Consult Note (Signed)
Consult Note   Michelle Patton 1955/10/14  [id number].    Requesting MD: Dr. Rogene Houston Chief Complaint/Reason for Consult: perforated appendicitis  HPI:  66 y.o. female with medical history significant for mesenteric ischemia, HLD, HTN, hypothyroidism who presented to [facility] ED with abdominal pain that has been present for over 3 weeks. She had been evaluated by her GI provider who recommended evaluation in the ED. She has chronic abdominal pain related to mesenteric ischemia but this pain was notably different, worse in right lower abdomen and worsening. She has not had associated n/v but pain aggravated with food. She had been tolerating clear liquids (ginger ale and sparkling water). Work up showed perforated appendicitis and she was transferred to [facility] and admitted to hospitalist service for further care.  Since admission she has been NPO and remains without nausea. Her abdominal pain has not improved. Last BM Monday with some associated abdominal pain - no melena or hematochezia. Passing flatus. She is ambulating. She denies other complaints. She is on plavix currently in setting of SMA stent. She has taken coumadin in the past but not prescribed currently.  Substance use: occ etoh. 1 cigarette per day Allergies: contrast - anaphylaxis Blood thinners: plavix - last taken Monday 11/20 pm Past Surgeries: lap chole  ROS: Review of Systems  Constitutional:  Negative for chills and fever.  Respiratory:  Negative for cough and shortness of breath.   Cardiovascular:  Negative for chest pain and palpitations.  Gastrointestinal:  Positive for abdominal pain. Negative for constipation, nausea and vomiting.    Family History  Problem Relation Age of Onset   Hypertension Mother    Hypertension Father    Heart disease Father        before age 11   Other Father        varicose veins   COPD Father    Colon cancer Neg Hx    Celiac disease Neg Hx    Inflammatory bowel disease  Neg Hx     Past Medical History:  Diagnosis Date   Aortic regurgitation 07/20/2014   Aortic stenosis, severe 07/20/2014   Arthritis    DVT (deep venous thrombosis) (HCC)    Family history of adverse reaction to anesthesia    Reports father deliurm in his 48's with CABG   GERD (gastroesophageal reflux disease)    History of pneumonia    Hyperlipidemia    Hypertension    Insomnia, unspecified    Muscle weakness (generalized)    Peripheral artery disease (HCC)    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   Redo sternotomy for 21 mm Medtronic Freestyle porcine aortic root graft w/ reimplantation of left main and right coronary arteries   Sleep apnea    diagnosed multiple years ago at [facility] aortic stenosis, congenital - s/p repair during childhood     Past Surgical History:  Procedure Laterality Date   ABDOMINAL AORTAGRAM  06/24/2012   ABDOMINAL AORTAGRAM N/A 06/24/2012   Procedure: ABDOMINAL Maxcine Ham;  Surgeon: Angelia Mould, MD;  Location: [facility] CATH LAB;  Service: Cardiovascular;  Laterality: N/A;   AORTIC VALVE REPLACEMENT N/A 10/07/2014   Procedure: REDO AORTIC VALVE REPLACEMENT (AVR);  Surgeon: Rexene Alberts, MD;  Location: [place];  Service: Open Heart Surgery;  Laterality: N/A;   ASCENDING AORTIC ROOT REPLACEMENT N/A 10/07/2014   Procedure: ASCENDING AORTIC ROOT REPLACEMENT;  Surgeon: Rexene Alberts, MD;  Location: [place];  Service: Open Heart Surgery;  Laterality: N/A;   BIOPSY  09/27/2016   Procedure: BIOPSY;  Surgeon: Daneil Dolin, MD;  [facility];  Service: Endoscopy;;  colon   BIOPSY  10/18/2020   Procedure: BIOPSY;  Surgeon: Eloise Harman, DO;  [facility];  Service: Endoscopy;;   BREAST REDUCTION SURGERY Bilateral 01/21/2018   Procedure: BREAST REDUCTION WITH LIPOSUCTION;  Surgeon: Cristine Polio, MD;  Location: [place];  Service: Plastics;   Laterality: Bilateral;   CARDIAC VALVE SURGERY  1968   CARPAL TUNNEL RELEASE Right 03/02/2020   Procedure: CARPAL TUNNEL RELEASE;  Surgeon: Carole Civil, MD;  [facility];  Service: Orthopedics;  Laterality: Right;   CATARACT EXTRACTION W/PHACO Left 05/30/2019   Procedure: CATARACT EXTRACTION PHACO AND INTRAOCULAR LENS PLACEMENT (IOC) (CDE: 4.94  );  Surgeon: Baruch Goldmann, MD;  Location: [facility];  Service: Ophthalmology;  Laterality: Left;   CATARACT EXTRACTION W/PHACO Right 06/16/2019   Procedure: CATARACT EXTRACTION PHACO AND INTRAOCULAR LENS PLACEMENT (IOC);  Surgeon: Baruch Goldmann, MD;  [facility]: [facility];  Service: Ophthalmology;  Laterality: Right;  CDE: 4.64   CERVICAL FUSION     CHOLECYSTECTOMY     COLONOSCOPY WITH PROPOFOL N/A 09/27/2016   Dr. Gala Romney: Diverticulosis, several tubular adenomas removed ranging 4 to 7 mm in size, internal grade 1 hemorrhoids, terminal ileum normal, segmental biopsies negative for microscopic colitis.  Next colonoscopy June 2021   COLONOSCOPY WITH PROPOFOL N/A 05/06/2020   internal hemorrhoids, sigmoid diverticulosis. Surveillance colonoscopy due in 2027   ESOPHAGOGASTRODUODENOSCOPY (EGD) WITH PROPOFOL N/A 09/27/2016   Dr. Gala Romney: Small hiatal hernia, mild Schatzki ring status post disruption, LA grade a esophagitis   ESOPHAGOGASTRODUODENOSCOPY (EGD) WITH PROPOFOL N/A 10/18/2020   non-obstructing mild Schatzki ring, gastritis s/ biopsy. Negative H.pylori.   ILIAC ARTERY STENT Left 12/2007   IR ANGIOGRAM EXTREMITY BILATERAL  07/12/2021   IR ANGIOGRAM VISCERAL SELECTIVE  10/29/2020   IR ANGIOGRAM VISCERAL SELECTIVE  07/12/2021   IR IVUS EACH ADDITIONAL NON CORONARY VESSEL  07/12/2021   IR RADIOLOGIST EVAL & MGMT  10/26/2020   IR RADIOLOGIST EVAL & MGMT  11/10/2020   IR RADIOLOGIST EVAL & MGMT  02/09/2021   IR RADIOLOGIST EVAL & MGMT  06/16/2021   IR RADIOLOGIST EVAL & MGMT  08/15/2021   IR RADIOLOGIST EVAL & MGMT  10/18/2021   IR RADIOLOGIST EVAL &  MGMT  01/11/2022   IR RADIOLOGIST EVAL & MGMT  03/02/2022   IR THROMBECT SEC MECH MOD SED  07/12/2021   IR TRANSCATH PLC STENT 1ST ART NOT LE CV CAR VERT CAR  10/29/2020   IR TRANSCATH PLC STENT 1ST ART NOT LE CV CAR VERT CAR  07/12/2021   IR US GUIDE VASC ACCESS RIGHT  10/29/2020   IR US GUIDE VASC ACCESS RIGHT  07/12/2021   KNEE ARTHROSCOPY WITH MEDIAL MENISECTOMY Right 01/10/2018   Procedure: RIGHT KNEE ARTHROSCOPY WITH PARTIAL MEDIAL MENISECTOMY;  Surgeon: Carole Civil, MD;  [facility];  Service: Orthopedics;  Laterality: Right;   LEFT AND RIGHT HEART CATHETERIZATION WITH CORONARY ANGIOGRAM N/A 07/31/2014   Procedure: LEFT AND RIGHT HEART CATHETERIZATION WITH CORONARY ANGIOGRAM;  Surgeon: Burnell Blanks, MD;  Location: [facility] CATH LAB;  Service: Cardiovascular;  Laterality: N/A;   MALONEY DILATION N/A 09/27/2016   Procedure: Venia Minks DILATION;  Surgeon: Daneil Dolin, MD;  [facility];  Service: Endoscopy;  Laterality: N/A;   POLYPECTOMY  09/27/2016   Procedure: POLYPECTOMY;  Surgeon: Daneil Dolin, MD;  [facility];  Service: Endoscopy;;  colon   ROTATOR CUFF REPAIR Bilateral    TEE WITHOUT CARDIOVERSION N/A 07/07/2014   Procedure: TRANSESOPHAGEAL ECHOCARDIOGRAM (TEE);  Surgeon: Arnoldo Lenis, MD;  [facility];  Service: Cardiology;  Laterality: N/A;   TEE WITHOUT CARDIOVERSION N/A 07/20/2014   Procedure: TRANSESOPHAGEAL ECHOCARDIOGRAM (TEE) WITH PROPOFOL;  Surgeon: Arnoldo Lenis, MD;  [facility];  Service: Endoscopy;  Laterality: N/A;   TEE WITHOUT CARDIOVERSION N/A 10/07/2014   Procedure: TRANSESOPHAGEAL ECHOCARDIOGRAM (TEE);  Surgeon: Rexene Alberts, MD;  Location: [place];  Service: Open Heart Surgery;  Laterality: N/A;    Social History:  reports that she quit smoking about 9 months ago. Her smoking use included cigarettes. She has a 20.00 pack-year smoking history. She has never used smokeless tobacco. She reports  current alcohol use of about 2.0 standard drinks of alcohol per week. She reports that she does not use drugs.  Allergies:  Allergies  Allergen Reactions   Iodinated Contrast Media Anaphylaxis, Shortness Of Breath, Itching, Swelling and Other (See Comments)    Patient was given Omni 350 and suffered from itching, chest pain and shortness of breath. Patient was taken to ED after onset of reaction. 10/11/2021  MD zackowski noted pt had tongue and throat swelling, had to give pt epinephrine    Sulfa Antibiotics Nausea And Vomiting    Medications Prior to Admission  Medication Sig Dispense Refill   aspirin EC 81 MG tablet Take 1 tablet (81 mg total) by mouth daily. Swallow whole. 90 tablet 3   Cholecalciferol (DIALYVITE VITAMIN D 5000) 125 MCG (5000 UT) capsule Take 5,000 Units by mouth daily.     clopidogrel (PLAVIX) 75 MG tablet Take 1 tablet (75 mg total) by mouth daily. 90 tablet 3   diclofenac (VOLTAREN) 75 MG EC tablet TAKE 1 TABLET TWICE A DAY (Patient taking differently: Take 75 mg by mouth 2 (two) times daily.) 60 tablet 11   dicyclomine (BENTYL) 10 MG capsule Take 1 capsule (10 mg total) by mouth every 8 (eight) hours as needed for spasms. For left-sided discomfort and frequent stools. Watch for constipation, dry mouth, dizziness. 90 capsule 1   estradiol (ESTRACE) 2 MG tablet Take 1 tablet (2 mg total) by mouth daily. 30 tablet 3   ezetimibe (ZETIA) 10 MG tablet Take 1 tablet (10 mg total) by mouth daily. 90 tablet 3   Flaxseed, Linseed, (FLAX SEED OIL PO) Take 1 capsule by mouth daily.     hydrocortisone (ANUSOL-HC) 2.5 % rectal cream Place 1 Application rectally 2 (two) times daily. As needed for rectal bleeding. 30 g 1   losartan (COZAAR) 25 MG tablet TAKE 1 TABLET BY MOUTH DAILY (Patient taking differently: Take 25 mg by mouth daily.) 90 tablet 3   metoprolol tartrate (LOPRESSOR) 25 MG tablet TAKE 1 TABLET TWICE A DAY 180 tablet 3   Multiple Minerals (CALCIUM-MAGNESIUM-ZINC) TABS  Take 1 tablet by mouth daily.     Multiple Vitamin (MULTIVITAMIN WITH MINERALS) TABS tablet Take 1 tablet by mouth daily.     NP THYROID 120 MG tablet Take 120 mg by mouth every morning.     progesterone (PROMETRIUM) 200 MG capsule Take 2 capsules (400 mg total) by mouth daily. 180 capsule 1   RABEprazole (ACIPHEX) 20 MG tablet Take 1 tablet (20 mg total) by mouth 2 (two) times daily before a meal. 180 tablet 3   rosuvastatin (CRESTOR) 40 MG  tablet TAKE 1 TABLET DAILY 90 tablet 3   triamcinolone ointment (KENALOG) 0.1 % Apply 1 Application topically 2 (two) times daily as needed. 454 g 0    Blood pressure 128/68, pulse (!) 104, temperature (!) 97.5 F (36.4 C), temperature source Oral, resp. rate 18, SpO2 95 %. Physical Exam: General: pleasant, WD, female who is laying in bed in NAD HEENT: head is normocephalic, atraumatic.  Sclera are noninjected.  Pupils equal and round. EOMs intact.  Ears and nose without any masses or lesions.  Mouth is pink and moist Heart: regular, rate, and rhythm.  Normal s1,s2. No obvious murmurs, gallops, or rubs noted.  Palpable radial and pedal pulses bilaterally Lungs: CTAB, no wheezes, rhonchi, or rales noted.  Respiratory effort nonlabored Abd: soft, ND, +BS, no masses, hernias, or organomegaly. TTP periumbilically and right lower quadrant - greatest in RLQ. No rebound or guarding. Multiple well healed abdominal scars. MSK: all 4 extremities are symmetrical with no cyanosis, clubbing, or edema. Skin: warm and dry with no masses, lesions, or rashes Neuro: Cranial nerves 2-12 grossly intact, sensation is normal throughout Psych: A&Ox3 with an appropriate affect.    Results for orders placed or performed during the hospital encounter of 03/14/22 (from the past 48 hour(s))  Urinalysis, Routine w reflex microscopic Urine, Clean Catch     Status: Abnormal   Collection Time: 03/14/22  7:18 PM  Result Value Ref Range   Color, Urine YELLOW YELLOW   APPearance CLEAR  CLEAR   Specific Gravity, Urine 1.016 1.005 - 1.030   pH 7.0 5.0 - 8.0   Glucose, UA NEGATIVE NEGATIVE mg/dL   Hgb urine dipstick NEGATIVE NEGATIVE   Bilirubin Urine NEGATIVE NEGATIVE   Ketones, ur 20 (A) NEGATIVE mg/dL   Protein, ur 30 (A) NEGATIVE mg/dL   Nitrite NEGATIVE NEGATIVE   Leukocytes,Ua NEGATIVE NEGATIVE   RBC / HPF 0-5 0 - 5 RBC/hpf   WBC, UA 6-10 0 - 5 WBC/hpf   Bacteria, UA FEW (A) NONE SEEN   Squamous Epithelial / LPF 6-10 0 - 5    Comment: Performed at [facility], [address] [place], [place] [address]  Lipase, blood     Status: None   Collection Time: 03/14/22  8:20 PM  Result Value Ref Range   Lipase 30 11 - 51 U/L    Comment: Performed at [facility], [address] [place], [place] [address]  Comprehensive metabolic panel     Status: Abnormal   Collection Time: 03/14/22  8:20 PM  Result Value Ref Range   Sodium 136 135 - 145 mmol/L   Potassium 3.9 3.5 - 5.1 mmol/L   Chloride 102 98 - 111 mmol/L   CO2 24 22 - 32 mmol/L   Glucose, Bld 98 70 - 99 mg/dL    Comment: Glucose reference range applies only to samples taken after fasting for at least 8 hours.   BUN 9 8 - 23 mg/dL   Creatinine, Ser 0.79 0.44 - 1.00 mg/dL   Calcium 9.0 8.9 - 10.3 mg/dL   Total Protein 7.8 6.5 - 8.1 g/dL   Albumin 3.4 (L) 3.5 - 5.0 g/dL   AST 14 (L) 15 - 41 U/L   ALT 15 0 - 44 U/L   Alkaline Phosphatase 78 38 - 126 U/L   Total Bilirubin 0.6 0.3 - 1.2 mg/dL   GFR, Estimated >60 >60 mL/min    Comment: (NOTE) Calculated using the CKD-EPI Creatinine Equation (2021)    Anion gap  10 5 - 15    Comment: Performed at [facility], [address] [place], [place] [address]  CBC     Status: Abnormal   Collection Time: 03/14/22  8:20 PM  Result Value Ref Range   WBC 15.9 (H) 4.0 - 10.5 K/uL   RBC 4.10 3.87 - 5.11 MIL/uL   Hemoglobin 12.1 12.0 - 15.0 g/dL   HCT 35.6 (L) 36.0 - 46.0 %   MCV 86.8 80.0 - 100.0 fL   MCH 29.5 26.0 - 34.0 pg   MCHC 34.0 30.0 - 36.0 g/dL   RDW 11.7  11.5 - 15.5 %   Platelets 370 150 - 400 K/uL   nRBC 0.0 0.0 - 0.2 %    Comment: Performed at [facility], [address] [place], [place] [address]  Protime-INR     Status: None   Collection Time: 03/14/22  8:20 PM  Result Value Ref Range   Prothrombin Time 14.5 11.4 - 15.2 seconds   INR 1.1 0.8 - 1.2    Comment: (NOTE) INR goal varies based on device and disease states. Performed at [facility], [address] [place], [place] [address]   Lactic acid, plasma     Status: None   Collection Time: 03/14/22  9:06 PM  Result Value Ref Range   Lactic Acid, Venous 1.0 0.5 - 1.9 mmol/L    Comment: Performed at [facility], [address], [place], [place] [address]  Lactic acid, plasma     Status: None   Collection Time: 03/14/22 11:38 PM  Result Value Ref Range   Lactic Acid, Venous 0.8 0.5 - 1.9 mmol/L    Comment: Performed at [facility], [address] [place], [place] [address]  CBC     Status: Abnormal   Collection Time: 03/15/22  6:05 AM  Result Value Ref Range   WBC 16.2 (H) 4.0 - 10.5 K/uL   RBC 3.93 3.87 - 5.11 MIL/uL   Hemoglobin 11.5 (L) 12.0 - 15.0 g/dL   HCT 34.6 (L) 36.0 - 46.0 %   MCV 88.0 80.0 - 100.0 fL   MCH 29.3 26.0 - 34.0 pg   MCHC 33.2 30.0 - 36.0 g/dL   RDW 11.7 11.5 - 15.5 %   Platelets 360 150 - 400 K/uL   nRBC 0.0 0.0 - 0.2 %    Comment: Performed at [facility], [place] [address] [place],  [address]  Comprehensive metabolic panel     Status: Abnormal   Collection Time: 03/15/22  6:05 AM  Result Value Ref Range   Sodium 137 135 - 145 mmol/L   Potassium 3.9 3.5 - 5.1 mmol/L   Chloride 104 98 - 111 mmol/L   CO2 21 (L) 22 - 32 mmol/L   Glucose, Bld 107 (H) 70 - 99 mg/dL    Comment: Glucose reference range applies only to samples taken after fasting for at least 8 hours.   BUN 7 (L) 8 - 23 mg/dL   Creatinine, Ser 0.97 0.44 - 1.00 mg/dL   Calcium 8.5 (L) 8.9 - 10.3 mg/dL   Total Protein 6.5 6.5 - 8.1 g/dL   Albumin 2.8 (L) 3.5 - 5.0 g/dL    AST 14 (L) 15 - 41 U/L   ALT 14 0 - 44 U/L   Alkaline Phosphatase 68 38 - 126 U/L   Total Bilirubin 0.4 0.3 - 1.2 mg/dL   GFR, Estimated >60 >60 mL/min    Comment: (NOTE) Calculated using the CKD-EPI Creatinine  Equation (2021)    Anion gap 12 5 - 15    Comment: Performed at [facility], [place] [address], [place], [place] [address]  Magnesium     Status: None   Collection Time: 03/15/22  6:05 AM  Result Value Ref Range   Magnesium 1.8 1.7 - 2.4 mg/dL    Comment: Performed at [facility] [address], [place], [place] [address]  Phosphorus     Status: None   Collection Time: 03/15/22  6:05 AM  Result Value Ref Range   Phosphorus 3.4 2.5 - 4.6 mg/dL    Comment: Performed at [facility] [address] [place], [place] [address]   CT ABDOMEN PELVIS WO CONTRAST  Result Date: 03/14/2022 CLINICAL DATA:  Abdominal pain, acute, nonlocalized History of mesenteric ischemia per tickly at the SMA but patient has severe contrast allergy, we are doing this as a screening. EXAM: CT ABDOMEN AND PELVIS WITHOUT CONTRAST TECHNIQUE: Multidetector CT imaging of the abdomen and pelvis was performed following the standard protocol without IV contrast. RADIATION DOSE REDUCTION: This exam was performed according to the departmental dose-optimization program which includes automated exposure control, adjustment of the mA and/or kV according to patient size and/or use of iterative reconstruction technique. COMPARISON:  None Available. FINDINGS: Lower chest: Coronary artery calcification. Hepatobiliary: No focal liver abnormality. Status post cholecystectomy. No biliary dilatation. Pancreas: No focal lesion. Normal pancreatic contour. No surrounding inflammatory changes. No main pancreatic ductal dilatation. Spleen: Normal in size without focal abnormality. Adrenals/Urinary Tract: No adrenal nodule bilaterally. No nephrolithiasis and no hydronephrosis. No definite contour-deforming renal mass.  No ureterolithiasis or hydroureter. The urinary bladder is unremarkable. Stomach/Bowel: Stomach is within normal limits. No evidence of bowel wall thickening or dilatation. Marked inflammatory changes in the region of the appendiceal tip. The appendiceal base is enlarged in caliber up to 1.3 cm with associated periappendiceal fat stranding. No appendicolith identified. No definite organized fluid collection. Vascular/Lymphatic: Limited evaluation of a superior mesenteric origin stent. No abdominal aorta or iliac aneurysm. Mild atherosclerotic plaque of the aorta and its branches. No abdominal, pelvic, or inguinal lymphadenopathy. Reproductive: Lobulated uterine contour with multiple mass lesions, some of which are coarsely calcified. Bilateral adnexal regions are unremarkable. Other: No intraperitoneal free fluid. No intraperitoneal free gas. No organized fluid collection. Musculoskeletal: No abdominal wall hernia or abnormality. No suspicious lytic or blastic osseous lesions. No acute displaced fracture. Multilevel degenerative changes of the spine with intervertebral disc space vacuum phenomenon. IMPRESSION: 1. Findings suggestive of perforated acute appendicitis with markedly limited evaluation on this noncontrast study. Recommend surgical consultation. 2. Degenerative uterine fibroids. 3. Limited evaluation of a superior mesenteric origin stent on this noncontrast study. 4.  Aortic Atherosclerosis (ICD10-I70.0). Electronically Signed   By: Iven Finn M.D.   On: 03/14/2022 22:03      Assessment/Plan Perforated appendicitis  Patient seen and examined.  Imaging, labs, vitals, care team notes reviewed. CT eval limited due to lack of contrast (pateint allergy) but consistent with the above and WBC elevated to 16.2.  Her exam and history also consistent with appendicitis.  Her lactic acid is normal and exam and vitals reassuring that we do not need to proceed with urgent/emergent intervention today.  She  last took her Plavix 11/20 p.m.  At this time recommend antibiotics and hold Plavix if able.  Will follow clinical course and plan for possible laparoscopic appendectomy once Plavix has worn off.  Agree with IR evaluation.  From our  standpoint she can have clear liquids but await IR. We will follow    FEN: NPO ID: [email or web address] VTE: plavix held. Okay for chemical ppx from gen surgery perspective  Per primary chronic mesenteric ischemia - s/p SMA stent placement 10/29/20 and repeat imaging 06/09/21 with occlusion. IR 07/12/21 SMA stent recanalization and balloon angioplasty of the proximal SMA  HLD HTN hypothyroidism    I reviewed ED provider notes, hospitalist notes, last 24 h vitals and pain scores, last 48 h intake and output, last 24 h labs and trends, and last 24 h imaging results.   Winferd Humphrey, [facility] Surgery 03/15/2022, 9:58 AM Please see Amion for pager number during day hours 7:00am-4:30pm

## 2022-03-15 NOTE — ED Notes (Signed)
Report given to [facility], RN at [facility]. Bryson Corona Edd Fabian

## 2022-03-15 NOTE — Progress Notes (Signed)
Mesenteric artery ultrasound study completed.   Please see CV Proc for preliminary results.   Darlin Coco, RDMS, RVT

## 2022-03-16 DIAGNOSIS — K3532 Acute appendicitis with perforation and localized peritonitis, without abscess: Secondary | ICD-10-CM | POA: Diagnosis not present

## 2022-03-16 LAB — CBC
HCT: 35.4 % — ABNORMAL LOW (ref 36.0–46.0)
Hemoglobin: 11.9 g/dL — ABNORMAL LOW (ref 12.0–15.0)
MCH: 29.5 pg (ref 26.0–34.0)
MCHC: 33.6 g/dL (ref 30.0–36.0)
MCV: 87.6 fL (ref 80.0–100.0)
Platelets: 365 10*3/uL (ref 150–400)
RBC: 4.04 MIL/uL (ref 3.87–5.11)
RDW: 11.7 % (ref 11.5–15.5)
WBC: 20.3 10*3/uL — ABNORMAL HIGH (ref 4.0–10.5)
nRBC: 0 % (ref 0.0–0.2)

## 2022-03-16 LAB — BASIC METABOLIC PANEL
Anion gap: 14 (ref 5–15)
BUN: 8 mg/dL (ref 8–23)
CO2: 19 mmol/L — ABNORMAL LOW (ref 22–32)
Calcium: 9.1 mg/dL (ref 8.9–10.3)
Chloride: 102 mmol/L (ref 98–111)
Creatinine, Ser: 0.88 mg/dL (ref 0.44–1.00)
GFR, Estimated: >60 mL/min (ref >60)
Glucose, Bld: 94 mg/dL (ref 70–99)
Potassium: 4.4 mmol/L (ref 3.5–5.1)
Sodium: 135 mmol/L (ref 135–145)

## 2022-03-16 MED ORDER — HYDROMORPHONE HCL 1 MG/ML IJ SOLN
2.0000 mg | INTRAMUSCULAR | Status: DC | PRN
  Administered 2022-03-16 – 2022-03-21 (×19): 2 mg via INTRAVENOUS
  Filled 2022-03-16 (×20): qty 2

## 2022-03-16 MED ORDER — HYDROMORPHONE HCL 2 MG PO TABS
1.0000 mg | ORAL_TABLET | Freq: Four times a day (QID) | ORAL | Status: DC | PRN
  Administered 2022-03-16: 1 mg via ORAL
  Filled 2022-03-16: qty 1

## 2022-03-16 MED ORDER — ONDANSETRON HCL 4 MG/2ML IJ SOLN: 4.0000 mg | Freq: Four times a day (QID) | INTRAMUSCULAR | Status: DC | PRN

## 2022-03-16 NOTE — Progress Notes (Signed)
[facility] Progress Note  Patient: Michelle Patton     KVQ:[id number]  DOA: 03/14/2022   PCP: Wendie Agreste, MD       Brief hospital course: This is a 66 year old female with history of mesenteric ischemia, hyperlipidemia, hypertension, hypothyroidism who presents to the hospital for abdominal pain that has been present for about 1-1/2 weeks now.  The patient states that she has chronic abdominal pain however about a week and a half ago this became severe in her right lower abdomen and progressed to a point where she was unable to eat or drink.  She presented to the ED and was found to have perforated acute appendicitis on CT of the abdomen and pelvis, WBC count of 15.9 and was tachycardic. She was started on IV antibiotics and general surgery was consulted.  She was transferred from [facility] to [facility] for further evaluation by the surgical team.  Subjective:  Continues to complain of severe abdominal pain.  The oxycodone that was initiated yesterday is causing her to vomit and she is asking for this to be changed to something different.  She is able to tolerate all of her other oral medications.  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, stenting and balloon angioplasty    Essential hypertension - cont Metoprolol - holding Losartan  Mild metabolic acidosis - Without anion gap - Continue to follow    Acquired hypothyroidism - cont NP Thyroid    Hypoalbuminemia - In setting of acute  infection - Follow        Code Status: Full Code DVT prophylaxis:  SCDs Start: 03/15/22 0437 Consultants: General surgery Level of Care: Level of care: Med-Surg Total time on patient care: 30   Objective:   Vitals:   03/15/22 1934 03/15/22 2318 03/16/22 0351 03/16/22 0811  BP: (!) 141/64 (!) 124/59 113/70 (!) 144/59  Pulse: 82 79 87 90  Resp: '20  18 18  '$ Temp: 97.9 F (36.6 C) 98.9 F (37.2 C) 99 F (37.2 C) 99.6 F (37.6 C)  TempSrc: Oral Oral Oral Oral  SpO2: 94% 94% 92% 94%   There were no vitals filed for this visit. Exam: General exam: Appears comfortable  HEENT: oral mucosa moist Respiratory system: Clear to auscultation.  Cardiovascular system: S1 & S2 heard  Gastrointestinal system: Abdomen soft, abdominal distention with tenderness noted, normal bowel sounds   Extremities: No cyanosis, clubbing or edema Psychiatry: Flat affect    CBC: Recent Labs  Lab 03/14/22 2020 03/15/22 0605 03/16/22 0259  WBC 15.9* 16.2* 20.3*  HGB 12.1 11.5* 11.9*  HCT 35.6* 34.6* 35.4*  MCV 86.8 88.0 87.6  PLT 370 360 643    Basic Metabolic Panel: Recent Labs  Lab 03/14/22 2020 03/15/22 0605 03/16/22 0259  NA 136 137 135  K 3.9 3.9 4.4  CL 102 104 102  CO2 24 21* 19*  GLUCOSE 98 107* 94  BUN 9 7* 8  CREATININE 0.79 0.97 0.88  CALCIUM 9.0 8.5* 9.1  MG  --  1.8  --   PHOS  --  3.4  --     GFR: Estimated Creatinine Clearance: 73.2 mL/min (by C-G formula based on SCr of 0.88 mg/dL).  Scheduled Meds:  estradiol  2 mg Oral Daily   metoprolol tartrate  25 mg Oral BID   pantoprazole (PROTONIX) IV  40 mg Intravenous Q12H   progesterone  400 mg Oral QHS   thyroid  120 mg Oral q morning   Continuous Infusions:  sodium chloride 100 mL/hr at 03/16/22 0454   piperacillin-tazobactam (ZOSYN)  IV 3.375 g (03/16/22 0501)   Imaging and lab data was personally reviewed [facility] [place] [facility]ESENTER[facility]  Result Date: 03/15/2022 ABDOMINAL VISCERAL Patient Name:  ZYAIRA VEJAR  Date  of Exam:   03/15/2022 Medical Rec #: [id number]         Accession #:    [id number] Date of Birth: 08/21/1955        Patient Gender: F Patient Age:   39 years Exam Location:  [facility] Procedure:      [facility] [place] [facility]ES[facility] Referring Phys: [id number] [place] -------------------------------------------------------------------------------- Indications: History of mesenteric ischemia with SMA stent, new onset abdominal              pain High Risk Factors: Hypertension, hyperlipidemia, past history of smoking. Limitations: Air/bowel gas and obesity. Comparison Study: 01-13-2022 Prior Mesenteric Artery Ultrasound showed mid-SMA                   stent velocities of 320 cm/s Performing Technologist: Darlin Coco RDMS, RVT  Examination Guidelines: A complete evaluation includes B-mode imaging, spectral Doppler, color Doppler, and power Doppler as needed of all accessible portions of each vessel. Bilateral testing is considered an integral part of a complete examination. Limited examinations for reoccurring indications may be performed as noted.  Duplex Findings: +----------------------+--------+--------+------+--------+ Mesenteric            PSV cm/sEDV cm/sPlaqueComments +----------------------+--------+--------+------+--------+ Aorta Mid               116                          +----------------------+--------+--------+------+--------+ Celiac Artery Origin    132                          +----------------------+--------+--------+------+--------+ Celiac Artery Proximal  223                          +----------------------+--------+--------+------+--------+ SMA Origin              216                          +----------------------+--------+--------+------+--------+ SMA Proximal            176                          +----------------------+--------+--------+------+--------+ SMA Mid                 489                           +----------------------+--------+--------+------+--------+ SMA Distal               92                          +----------------------+--------+--------+------+--------+  CHA                      93                          +----------------------+--------+--------+------+--------+ Splenic                  88                          +----------------------+--------+--------+------+--------+ IMA                     129                          +----------------------+--------+--------+------+--------+    Summary: Mesenteric:  Today's examination was limited due to overlying bowel gas; however, celiac, IMA, and SMA all appear patent. Velocities of 489 cm/s at the mid-SMA segment suggest some degree of in-stent stenosis.  *See table(s) above for measurements and observations.  Diagnosing physician: Monica Martinez MD  Electronically signed by Monica Martinez MD on 03/15/2022 at 12:44:04 PM.    Final    CT ABDOMEN PELVIS WO CONTRAST  Result Date: 03/14/2022 CLINICAL DATA:  Abdominal pain, acute, nonlocalized History of mesenteric ischemia per tickly at the SMA but patient has severe contrast allergy, we are doing this as a screening. EXAM: CT ABDOMEN AND PELVIS WITHOUT CONTRAST TECHNIQUE: Multidetector CT imaging of the abdomen and pelvis was performed following the standard protocol without IV contrast. RADIATION DOSE REDUCTION: This exam was performed according to the departmental dose-optimization program which includes automated exposure control, adjustment of the mA and/or kV according to patient size and/or use of iterative reconstruction technique. COMPARISON:  None Available. FINDINGS: Lower chest: Coronary artery calcification. Hepatobiliary: No focal liver abnormality. Status post cholecystectomy. No biliary dilatation. Pancreas: No focal lesion. Normal pancreatic contour. No surrounding inflammatory changes. No main pancreatic ductal dilatation. Spleen: Normal in size without  focal abnormality. Adrenals/Urinary Tract: No adrenal nodule bilaterally. No nephrolithiasis and no hydronephrosis. No definite contour-deforming renal mass. No ureterolithiasis or hydroureter. The urinary bladder is unremarkable. Stomach/Bowel: Stomach is within normal limits. No evidence of bowel wall thickening or dilatation. Marked inflammatory changes in the region of the appendiceal tip. The appendiceal base is enlarged in caliber up to 1.3 cm with associated periappendiceal fat stranding. No appendicolith identified. No definite organized fluid collection. Vascular/Lymphatic: Limited evaluation of a superior mesenteric origin stent. No abdominal aorta or iliac aneurysm. Mild atherosclerotic plaque of the aorta and its branches. No abdominal, pelvic, or inguinal lymphadenopathy. Reproductive: Lobulated uterine contour with multiple mass lesions, some of which are coarsely calcified. Bilateral adnexal regions are unremarkable. Other: No intraperitoneal free fluid. No intraperitoneal free gas. No organized fluid collection. Musculoskeletal: No abdominal wall hernia or abnormality. No suspicious lytic or blastic osseous lesions. No acute displaced fracture. Multilevel degenerative changes of the spine with intervertebral disc space vacuum phenomenon. IMPRESSION: 1. Findings suggestive of perforated acute appendicitis with markedly limited evaluation on this noncontrast study. Recommend surgical consultation. 2. Degenerative uterine fibroids. 3. Limited evaluation of a superior mesenteric origin stent on this noncontrast study. 4.  Aortic Atherosclerosis (ICD10-I70.0). Electronically Signed   By: Iven Finn M.D.   On: 03/14/2022 22:03    LOS: 2 days   Author: Eunice Blase Mariama Saintvil  03/16/2022 11:10 AM  To contact [facility]>   Check the care team  in [facility] and look for the attending/consulting [facility] provider listed  Log into [email or web address] and use [facility] universal password   Go to> "[facility]"  and find provider  If you still have difficulty reaching the provider, please page the [facility] (Director on Call) for the Hospitalists listed on amion

## 2022-03-16 NOTE — Plan of Care (Signed)

## 2022-03-16 NOTE — Progress Notes (Signed)
Subjective/Chief Complaint: Pt with con't RLQ pain   Objective: Vital signs in last 24 hours: Temp:  [97.9 F (36.6 C)-99.6 F (37.6 C)] 99.6 F (37.6 C) (11/23 0811) Pulse Rate:  [79-104] 90 (11/23 0811) Resp:  [18-20] 18 (11/23 0811) BP: (113-144)/(59-81) 144/59 (11/23 0811) SpO2:  [92 %-95 %] 94 % (11/23 0811) Last BM Date :  (PTA)  Intake/Output from previous day: 11/22 0701 - 11/23 0700 In: 1591.4 [I.V.:1491.5; IV Piggyback:99.9] Out: -  Intake/Output this shift: No intake/output data recorded.  PE:  Constitutional: No acute distress, conversant, appears states age. Eyes: Anicteric sclerae, moist conjunctiva, no lid lag Lungs: Clear to auscultation bilaterally, normal respiratory effort CV: regular rate and rhythm, no murmurs, no peripheral edema, pedal pulses 2+ GI: Soft, no masses or hepatosplenomegaly, tender to palpation RLQ Skin: No rashes, palpation reveals normal turgor Psychiatric: appropriate judgment and insight, oriented to person, place, and time   Lab Results:  Recent Labs    03/15/22 0605 03/16/22 0259  WBC 16.2* 20.3*  HGB 11.5* 11.9*  HCT 34.6* 35.4*  PLT 360 365   BMET Recent Labs    03/15/22 0605 03/16/22 0259  NA 137 135  K 3.9 4.4  CL 104 102  CO2 21* 19*  GLUCOSE 107* 94  BUN 7* 8  CREATININE 0.97 0.88  CALCIUM 8.5* 9.1   PT/INR Recent Labs    03/14/22 2020  LABPROT 14.5  INR 1.1   ABG No results for input(s): "PHART", "HCO3" in the last 72 hours.  Invalid input(s): "PCO2", "PO2"  Studies/Results: VAS [place] MESENTERIC  Result Date: 03/15/2022 ABDOMINAL VISCERAL Patient Name:  Michelle Patton  Date of Exam:   03/15/2022 Medical Rec #: [id number]         Accession #:    [id number] Date of Birth: 05-12-1955        Patient Gender: F Patient Age:   66 years Exam Location:  [facility] Procedure:      VAS [place] MESENTERIC Referring Phys: [id number] [place]  -------------------------------------------------------------------------------- Indications: History of mesenteric ischemia with SMA stent, new onset abdominal              pain High Risk Factors: Hypertension, hyperlipidemia, past history of smoking. Limitations: Air/bowel gas and obesity. Comparison Study: 01-13-2022 Prior Mesenteric Artery Ultrasound showed mid-SMA                   stent velocities of 320 cm/s Performing Technologist: Darlin Coco RDMS, RVT  Examination Guidelines: A complete evaluation includes B-mode imaging, spectral Doppler, color Doppler, and power Doppler as needed of all accessible portions of each vessel. Bilateral testing is considered an integral part of a complete examination. Limited examinations for reoccurring indications may be performed as noted.  Duplex Findings: +----------------------+--------+--------+------+--------+ Mesenteric            PSV cm/sEDV cm/sPlaqueComments +----------------------+--------+--------+------+--------+ Aorta Mid               116                          +----------------------+--------+--------+------+--------+ Celiac Artery Origin    132                          +----------------------+--------+--------+------+--------+ Celiac Artery Proximal  223                          +----------------------+--------+--------+------+--------+  SMA Origin              216                          +----------------------+--------+--------+------+--------+ SMA Proximal            176                          +----------------------+--------+--------+------+--------+ SMA Mid                 489                          +----------------------+--------+--------+------+--------+ SMA Distal               92                          +----------------------+--------+--------+------+--------+ CHA                      93                          +----------------------+--------+--------+------+--------+ Splenic                   88                          +----------------------+--------+--------+------+--------+ IMA                     129                          +----------------------+--------+--------+------+--------+    Summary: Mesenteric:  Today's examination was limited due to overlying bowel gas; however, celiac, IMA, and SMA all appear patent. Velocities of 489 cm/s at the mid-SMA segment suggest some degree of in-stent stenosis.  *See table(s) above for measurements and observations.  Diagnosing physician: Monica Martinez MD  Electronically signed by Monica Martinez MD on 03/15/2022 at 12:44:04 PM.    Final    CT ABDOMEN PELVIS WO CONTRAST  Result Date: 03/14/2022 CLINICAL DATA:  Abdominal pain, acute, nonlocalized History of mesenteric ischemia per tickly at the SMA but patient has severe contrast allergy, we are doing this as a screening. EXAM: CT ABDOMEN AND PELVIS WITHOUT CONTRAST TECHNIQUE: Multidetector CT imaging of the abdomen and pelvis was performed following the standard protocol without IV contrast. RADIATION DOSE REDUCTION: This exam was performed according to the departmental dose-optimization program which includes automated exposure control, adjustment of the mA and/or kV according to patient size and/or use of iterative reconstruction technique. COMPARISON:  None Available. FINDINGS: Lower chest: Coronary artery calcification. Hepatobiliary: No focal liver abnormality. Status post cholecystectomy. No biliary dilatation. Pancreas: No focal lesion. Normal pancreatic contour. No surrounding inflammatory changes. No main pancreatic ductal dilatation. Spleen: Normal in size without focal abnormality. Adrenals/Urinary Tract: No adrenal nodule bilaterally. No nephrolithiasis and no hydronephrosis. No definite contour-deforming renal mass. No ureterolithiasis or hydroureter. The urinary bladder is unremarkable. Stomach/Bowel: Stomach is within normal limits. No evidence of bowel wall  thickening or dilatation. Marked inflammatory changes in the region of the appendiceal tip. The appendiceal base is enlarged in caliber up to 1.3 cm with associated periappendiceal fat stranding. No appendicolith identified. No definite organized fluid collection. Vascular/Lymphatic: Limited  evaluation of a superior mesenteric origin stent. No abdominal aorta or iliac aneurysm. Mild atherosclerotic plaque of the aorta and its branches. No abdominal, pelvic, or inguinal lymphadenopathy. Reproductive: Lobulated uterine contour with multiple mass lesions, some of which are coarsely calcified. Bilateral adnexal regions are unremarkable. Other: No intraperitoneal free fluid. No intraperitoneal free gas. No organized fluid collection. Musculoskeletal: No abdominal wall hernia or abnormality. No suspicious lytic or blastic osseous lesions. No acute displaced fracture. Multilevel degenerative changes of the spine with intervertebral disc space vacuum phenomenon. IMPRESSION: 1. Findings suggestive of perforated acute appendicitis with markedly limited evaluation on this noncontrast study. Recommend surgical consultation. 2. Degenerative uterine fibroids. 3. Limited evaluation of a superior mesenteric origin stent on this noncontrast study. 4.  Aortic Atherosclerosis (ICD10-I70.0). Electronically Signed   By: Iven Finn M.D.   On: 03/14/2022 22:03    Anti-infectives: Anti-infectives (From admission, onward)    Start     Dose/Rate Route Frequency Ordered Stop   03/15/22 0600  piperacillin-tazobactam (ZOSYN) IVPB 3.375 g        3.375 g 12.5 mL/hr over 240 Minutes Intravenous Every 8 hours 03/15/22 0451     03/14/22 2245  piperacillin-tazobactam (ZOSYN) IVPB 3.375 g        3.375 g 100 mL/hr over 30 Minutes Intravenous  Once 03/14/22 2234 03/15/22 0019       Assessment/Plan: Expand All Collapse All           Consult Note    Michelle Patton [birth year]  [id number].     Requesting MD: Dr.  Rogene Houston Chief Complaint/Reason for Consult: perforated appendicitis   HPI:  66 y.o. female with medical history significant for mesenteric ischemia, HLD, HTN, hypothyroidism who presented to [facility] ED with abdominal pain that has been present for over 3 weeks. She had been evaluated by her GI provider who recommended evaluation in the ED. She has chronic abdominal pain related to mesenteric ischemia but this pain was notably different, worse in right lower abdomen and worsening. She has not had associated n/v but pain aggravated with food. She had been tolerating clear liquids (ginger ale and sparkling water). Work up showed perforated appendicitis and she was transferred to [facility] and admitted to hospitalist service for further care.   Since admission she has been NPO and remains without nausea. Her abdominal pain has not improved. Last BM Monday with some associated abdominal pain - no melena or hematochezia. Passing flatus. She is ambulating. She denies other complaints. She is on plavix currently in setting of SMA stent. She has taken coumadin in the past but not prescribed currently.   Substance use: occ etoh. 1 cigarette per day Allergies: contrast - anaphylaxis Blood thinners: plavix - last taken Monday 11/20 pm Past Surgeries: lap chole   ROS: Review of Systems  Constitutional:  Negative for chills and fever.  Respiratory:  Negative for cough and shortness of breath.   Cardiovascular:  Negative for chest pain and palpitations.  Gastrointestinal:  Positive for abdominal pain. Negative for constipation, nausea and vomiting.           Family History  Problem Relation Age of Onset   Hypertension Mother     Hypertension Father     Heart disease Father          before age 41   Other Father          varicose veins   COPD Father     Colon cancer Neg Hx  Celiac disease Neg Hx     Inflammatory bowel disease Neg Hx            Past Medical History:  Diagnosis Date   Aortic  regurgitation 07/20/2014   Aortic stenosis, severe 07/20/2014   Arthritis     DVT (deep venous thrombosis) (HCC)     Family history of adverse reaction to anesthesia      Reports father deliurm in his 78's with CABG   GERD (gastroesophageal reflux disease)     History of pneumonia     Hyperlipidemia     Hypertension     Insomnia, unspecified     Muscle weakness (generalized)     Peripheral artery disease (HCC)     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    Redo sternotomy for 21 mm Medtronic Freestyle porcine aortic root graft w/ reimplantation of left main and right coronary arteries   Sleep apnea      diagnosed multiple years ago at [facility] aortic stenosis, congenital - s/p repair during childhood             Past Surgical History:  Procedure Laterality Date   ABDOMINAL AORTAGRAM   06/24/2012   ABDOMINAL AORTAGRAM N/A 06/24/2012    Procedure: ABDOMINAL Maxcine Ham;  Surgeon: Angelia Mould, MD;  Location: [facility] CATH LAB;  Service: Cardiovascular;  Laterality: N/A;   AORTIC VALVE REPLACEMENT N/A 10/07/2014    Procedure: REDO AORTIC VALVE REPLACEMENT (AVR);  Surgeon: Rexene Alberts, MD;  Location: [place];  Service: Open Heart Surgery;  Laterality: N/A;   ASCENDING AORTIC ROOT REPLACEMENT N/A 10/07/2014    Procedure: ASCENDING AORTIC ROOT REPLACEMENT;  Surgeon: Rexene Alberts, MD;  Location: [place];  Service: Open Heart Surgery;  Laterality: N/A;   BIOPSY   09/27/2016    Procedure: BIOPSY;  Surgeon: Daneil Dolin, MD;  [facility];  Service: Endoscopy;;  colon   BIOPSY   10/18/2020    Procedure: BIOPSY;  Surgeon: Eloise Harman, DO;  [facility];  Service: Endoscopy;;   BREAST REDUCTION SURGERY Bilateral 01/21/2018    Procedure: BREAST REDUCTION WITH LIPOSUCTION;  Surgeon: Cristine Polio, MD;  Location: [place];  Service: Plastics;  Laterality: Bilateral;   CARDIAC  VALVE SURGERY   1968   CARPAL TUNNEL RELEASE Right 03/02/2020    Procedure: CARPAL TUNNEL RELEASE;  Surgeon: Carole Civil, MD;  Location: [facility];  Service: Orthopedics;  Laterality: Right;   CATARACT EXTRACTION W/PHACO Left 05/30/2019    Procedure: CATARACT EXTRACTION PHACO AND INTRAOCULAR LENS PLACEMENT (IOC) (CDE: 4.94  );  Surgeon: Baruch Goldmann, MD;  Location: [facility];  Service: Ophthalmology;  Laterality: Left;   CATARACT EXTRACTION W/PHACO Right 06/16/2019    Procedure: CATARACT EXTRACTION PHACO AND INTRAOCULAR LENS PLACEMENT (IOC);  Surgeon: Baruch Goldmann, MD;  Location: [facility];  Service: Ophthalmology;  Laterality: Right;  CDE: 4.64   CERVICAL FUSION       CHOLECYSTECTOMY       COLONOSCOPY WITH PROPOFOL N/A 09/27/2016    Dr. Gala Romney: Diverticulosis, several tubular adenomas removed ranging 4 to 7 mm in size, internal grade 1 hemorrhoids, terminal ileum normal, segmental biopsies negative for microscopic colitis.  Next colonoscopy June 2021   COLONOSCOPY WITH PROPOFOL N/A 05/06/2020    internal hemorrhoids, sigmoid diverticulosis. Surveillance colonoscopy due in 2027   ESOPHAGOGASTRODUODENOSCOPY (EGD) WITH PROPOFOL N/A 09/27/2016    Dr. Gala Romney: Small hiatal hernia,  mild Schatzki ring status post disruption, LA grade a esophagitis   ESOPHAGOGASTRODUODENOSCOPY (EGD) WITH PROPOFOL N/A 10/18/2020    non-obstructing mild Schatzki ring, gastritis s/ biopsy. Negative H.pylori.   ILIAC ARTERY STENT Left 12/2007   IR ANGIOGRAM EXTREMITY BILATERAL   07/12/2021   IR ANGIOGRAM VISCERAL SELECTIVE   10/29/2020   IR ANGIOGRAM VISCERAL SELECTIVE   07/12/2021   IR IVUS EACH ADDITIONAL NON CORONARY VESSEL   07/12/2021   IR RADIOLOGIST EVAL & MGMT   10/26/2020   IR RADIOLOGIST EVAL & MGMT   11/10/2020   IR RADIOLOGIST EVAL & MGMT   02/09/2021   IR RADIOLOGIST EVAL & MGMT   06/16/2021   IR RADIOLOGIST EVAL & MGMT   08/15/2021   IR RADIOLOGIST EVAL & MGMT   10/18/2021   IR RADIOLOGIST EVAL & MGMT    01/11/2022   IR RADIOLOGIST EVAL & MGMT   03/02/2022   IR THROMBECT SEC MECH MOD SED   07/12/2021   IR TRANSCATH PLC STENT 1ST ART NOT LE CV CAR VERT CAR   10/29/2020   IR TRANSCATH PLC STENT 1ST ART NOT LE CV CAR VERT CAR   07/12/2021   IR US GUIDE VASC ACCESS RIGHT   10/29/2020   IR US GUIDE VASC ACCESS RIGHT   07/12/2021   KNEE ARTHROSCOPY WITH MEDIAL MENISECTOMY Right 01/10/2018    Procedure: RIGHT KNEE ARTHROSCOPY WITH PARTIAL MEDIAL MENISECTOMY;  Surgeon: Carole Civil, MD;  [facility];  Service: Orthopedics;  Laterality: Right;   LEFT AND RIGHT HEART CATHETERIZATION WITH CORONARY ANGIOGRAM N/A 07/31/2014    Procedure: LEFT AND RIGHT HEART CATHETERIZATION WITH CORONARY ANGIOGRAM;  Surgeon: Burnell Blanks, MD;  Location: [facility] CATH LAB;  Service: Cardiovascular;  Laterality: N/A;   MALONEY DILATION N/A 09/27/2016    Procedure: Venia Minks DILATION;  Surgeon: Daneil Dolin, MD;  [facility];  Service: Endoscopy;  Laterality: N/A;   POLYPECTOMY   09/27/2016    Procedure: POLYPECTOMY;  Surgeon: Daneil Dolin, MD;  [facility];  Service: Endoscopy;;  colon   ROTATOR CUFF REPAIR Bilateral     TEE WITHOUT CARDIOVERSION N/A 07/07/2014    Procedure: TRANSESOPHAGEAL ECHOCARDIOGRAM (TEE);  Surgeon: Arnoldo Lenis, MD;  [facility];  Service: Cardiology;  Laterality: N/A;   TEE WITHOUT CARDIOVERSION N/A 07/20/2014    Procedure: TRANSESOPHAGEAL ECHOCARDIOGRAM (TEE) WITH PROPOFOL;  Surgeon: Arnoldo Lenis, MD;  [facility];  Service: Endoscopy;  Laterality: N/A;   TEE WITHOUT CARDIOVERSION N/A 10/07/2014    Procedure: TRANSESOPHAGEAL ECHOCARDIOGRAM (TEE);  Surgeon: Rexene Alberts, MD;  Location: [place];  Service: Open Heart Surgery;  Laterality: N/A;      Social History:  reports that she quit smoking about 9 months ago. Her smoking use included cigarettes. She has a 20.00 pack-year smoking history. She has never used smokeless tobacco. She  reports current alcohol use of about 2.0 standard drinks of alcohol per week. She reports that she does not use drugs.   Allergies:       Allergies  Allergen Reactions   Iodinated Contrast Media Anaphylaxis, Shortness Of Breath, Itching, Swelling and Other (See Comments)      Patient was given Omni 350 and suffered from itching, chest pain and shortness of breath. Patient was taken to ED after onset of reaction. 10/11/2021  MD zackowski noted pt had tongue and throat swelling, had to give pt epinephrine    Sulfa Antibiotics Nausea And Vomiting  Medications Prior to Admission  Medication Sig Dispense Refill   aspirin EC 81 MG tablet Take 1 tablet (81 mg total) by mouth daily. Swallow whole. 90 tablet 3   Cholecalciferol (DIALYVITE VITAMIN D 5000) 125 MCG (5000 UT) capsule Take 5,000 Units by mouth daily.       clopidogrel (PLAVIX) 75 MG tablet Take 1 tablet (75 mg total) by mouth daily. 90 tablet 3   diclofenac (VOLTAREN) 75 MG EC tablet TAKE 1 TABLET TWICE A DAY (Patient taking differently: Take 75 mg by mouth 2 (two) times daily.) 60 tablet 11   dicyclomine (BENTYL) 10 MG capsule Take 1 capsule (10 mg total) by mouth every 8 (eight) hours as needed for spasms. For left-sided discomfort and frequent stools. Watch for constipation, dry mouth, dizziness. 90 capsule 1   estradiol (ESTRACE) 2 MG tablet Take 1 tablet (2 mg total) by mouth daily. 30 tablet 3   ezetimibe (ZETIA) 10 MG tablet Take 1 tablet (10 mg total) by mouth daily. 90 tablet 3   Flaxseed, Linseed, (FLAX SEED OIL PO) Take 1 capsule by mouth daily.       hydrocortisone (ANUSOL-HC) 2.5 % rectal cream Place 1 Application rectally 2 (two) times daily. As needed for rectal bleeding. 30 g 1   losartan (COZAAR) 25 MG tablet TAKE 1 TABLET BY MOUTH DAILY (Patient taking differently: Take 25 mg by mouth daily.) 90 tablet 3   metoprolol tartrate (LOPRESSOR) 25 MG tablet TAKE 1 TABLET TWICE A DAY 180 tablet 3   Multiple Minerals  (CALCIUM-MAGNESIUM-ZINC) TABS Take 1 tablet by mouth daily.       Multiple Vitamin (MULTIVITAMIN WITH MINERALS) TABS tablet Take 1 tablet by mouth daily.       NP THYROID 120 MG tablet Take 120 mg by mouth every morning.       progesterone (PROMETRIUM) 200 MG capsule Take 2 capsules (400 mg total) by mouth daily. 180 capsule 1   RABEprazole (ACIPHEX) 20 MG tablet Take 1 tablet (20 mg total) by mouth 2 (two) times daily before a meal. 180 tablet 3   rosuvastatin (CRESTOR) 40 MG tablet TAKE 1 TABLET DAILY 90 tablet 3   triamcinolone ointment (KENALOG) 0.1 % Apply 1 Application topically 2 (two) times daily as needed. 454 g 0      Blood pressure 128/68, pulse (!) 104, temperature (!) 97.5 F (36.4 C), temperature source Oral, resp. rate 18, SpO2 95 %. Physical Exam: General: pleasant, WD, female who is laying in bed in NAD HEENT: head is normocephalic, atraumatic.  Sclera are noninjected.  Pupils equal and round. EOMs intact.  Ears and nose without any masses or lesions.  Mouth is pink and moist Heart: regular, rate, and rhythm.  Normal s1,s2. No obvious murmurs, gallops, or rubs noted.  Palpable radial and pedal pulses bilaterally Lungs: CTAB, no wheezes, rhonchi, or rales noted.  Respiratory effort nonlabored Abd: soft, ND, +BS, no masses, hernias, or organomegaly. TTP periumbilically and right lower quadrant - greatest in RLQ. No rebound or guarding. Multiple well healed abdominal scars. MSK: all 4 extremities are symmetrical with no cyanosis, clubbing, or edema. Skin: warm and dry with no masses, lesions, or rashes Neuro: Cranial nerves 2-12 grossly intact, sensation is normal throughout Psych: A&Ox3 with an appropriate affect.      Lab Results Last 48 Hours        Results for orders placed or performed during the hospital encounter of 03/14/22 (from the past 48 hour(s))  Urinalysis, Routine w  reflex microscopic Urine, Clean Catch     Status: Abnormal    Collection Time: 03/14/22  7:18 PM   Result Value Ref Range    Color, Urine YELLOW YELLOW    APPearance CLEAR CLEAR    Specific Gravity, Urine 1.016 1.005 - 1.030    pH 7.0 5.0 - 8.0    Glucose, UA NEGATIVE NEGATIVE mg/dL    Hgb urine dipstick NEGATIVE NEGATIVE    Bilirubin Urine NEGATIVE NEGATIVE    Ketones, ur 20 (A) NEGATIVE mg/dL    Protein, ur 30 (A) NEGATIVE mg/dL    Nitrite NEGATIVE NEGATIVE    Leukocytes,Ua NEGATIVE NEGATIVE    RBC / HPF 0-5 0 - 5 RBC/hpf    WBC, UA 6-10 0 - 5 WBC/hpf    Bacteria, UA FEW (A) NONE SEEN    Squamous Epithelial / LPF 6-10 0 - 5      Comment: Performed at [facility], [address] [place], [place] [address]  Lipase, blood     Status: None    Collection Time: 03/14/22  8:20 PM  Result Value Ref Range    Lipase 30 11 - 51 U/L      Comment: Performed at [facility], [address], [place], [place] [address]  Comprehensive metabolic panel     Status: Abnormal    Collection Time: 03/14/22  8:20 PM  Result Value Ref Range    Sodium 136 135 - 145 mmol/L    Potassium 3.9 3.5 - 5.1 mmol/L    Chloride 102 98 - 111 mmol/L    CO2 24 22 - 32 mmol/L    Glucose, Bld 98 70 - 99 mg/dL      Comment: Glucose reference range applies only to samples taken after fasting for at least 8 hours.    BUN 9 8 - 23 mg/dL    Creatinine, Ser 0.79 0.44 - 1.00 mg/dL    Calcium 9.0 8.9 - 10.3 mg/dL    Total Protein 7.8 6.5 - 8.1 g/dL    Albumin 3.4 (L) 3.5 - 5.0 g/dL    AST 14 (L) 15 - 41 U/L    ALT 15 0 - 44 U/L    Alkaline Phosphatase 78 38 - 126 U/L    Total Bilirubin 0.6 0.3 - 1.2 mg/dL    GFR, Estimated >60 >60 mL/min      Comment: (NOTE) Calculated using the CKD-EPI Creatinine Equation (2021)      Anion gap 10 5 - 15      Comment: Performed at [facility], [address], [place], [place] [address]  CBC     Status: Abnormal    Collection Time: 03/14/22  8:20 PM  Result Value Ref Range    WBC 15.9 (H) 4.0 - 10.5 K/uL    RBC 4.10 3.87 - 5.11 MIL/uL    Hemoglobin 12.1 12.0 - 15.0 g/dL     HCT 35.6 (L) 36.0 - 46.0 %    MCV 86.8 80.0 - 100.0 fL    MCH 29.5 26.0 - 34.0 pg    MCHC 34.0 30.0 - 36.0 g/dL    RDW 11.7 11.5 - 15.5 %    Platelets 370 150 - 400 K/uL    nRBC 0.0 0.0 - 0.2 %      Comment: Performed at [facility], [address], [place],  [address]  Protime-INR     Status: None    Collection Time: 03/14/22  8:20 PM  Result Value Ref Range  Prothrombin Time 14.5 11.4 - 15.2 seconds    INR 1.1 0.8 - 1.2      Comment: (NOTE) INR goal varies based on device and disease states. Performed at [facility], [address], [place], [place] [address]    Lactic acid, plasma     Status: None    Collection Time: 03/14/22  9:06 PM  Result Value Ref Range    Lactic Acid, Venous 1.0 0.5 - 1.9 mmol/L      Comment: Performed at [facility], [address] [place], [place] [address]  Lactic acid, plasma     Status: None    Collection Time: 03/14/22 11:38 PM  Result Value Ref Range    Lactic Acid, Venous 0.8 0.5 - 1.9 mmol/L      Comment: Performed at [facility], [address] [place], [place] [address]  CBC     Status: Abnormal    Collection Time: 03/15/22  6:05 AM  Result Value Ref Range    WBC 16.2 (H) 4.0 - 10.5 K/uL    RBC 3.93 3.87 - 5.11 MIL/uL    Hemoglobin 11.5 (L) 12.0 - 15.0 g/dL    HCT 34.6 (L) 36.0 - 46.0 %    MCV 88.0 80.0 - 100.0 fL    MCH 29.3 26.0 - 34.0 pg    MCHC 33.2 30.0 - 36.0 g/dL    RDW 11.7 11.5 - 15.5 %    Platelets 360 150 - 400 K/uL    nRBC 0.0 0.0 - 0.2 %      Comment: Performed at [facility], [place] [address] [place], [place] [address]  Comprehensive metabolic panel     Status: Abnormal    Collection Time: 03/15/22  6:05 AM  Result Value Ref Range    Sodium 137 135 - 145 mmol/L    Potassium 3.9 3.5 - 5.1 mmol/L    Chloride 104 98 - 111 mmol/L    CO2 21 (L) 22 - 32 mmol/L    Glucose, Bld 107 (H) 70 - 99 mg/dL      Comment: Glucose reference range applies only to samples taken after fasting for at least 8 hours.    BUN  7 (L) 8 - 23 mg/dL    Creatinine, Ser 0.97 0.44 - 1.00 mg/dL    Calcium 8.5 (L) 8.9 - 10.3 mg/dL    Total Protein 6.5 6.5 - 8.1 g/dL    Albumin 2.8 (L) 3.5 - 5.0 g/dL    AST 14 (L) 15 - 41 U/L    ALT 14 0 - 44 U/L    Alkaline Phosphatase 68 38 - 126 U/L    Total Bilirubin 0.4 0.3 - 1.2 mg/dL    GFR, Estimated >60 >60 mL/min      Comment: (NOTE) Calculated using the CKD-EPI Creatinine Equation (2021)      Anion gap 12 5 - 15      Comment: Performed at [facility], [place] [address], [place], [place] [address]  Magnesium     Status: None    Collection Time: 03/15/22  6:05 AM  Result Value Ref Range    Magnesium 1.8 1.7 - 2.4 mg/dL      Comment: Performed at [place] [address] [place], [place] [address]  Phosphorus     Status: None    Collection Time: 03/15/22  6:05 AM  Result Value Ref Range    Phosphorus 3.4 2.5 - 4.6 mg/dL  Comment: Performed at [facility], [place] [address] [place], [place] [address]       Imaging Results (Last 48 hours)  CT ABDOMEN PELVIS WO CONTRAST   Result Date: 03/14/2022 CLINICAL DATA:  Abdominal pain, acute, nonlocalized History of mesenteric ischemia per tickly at the SMA but patient has severe contrast allergy, we are doing this as a screening. EXAM: CT ABDOMEN AND PELVIS WITHOUT CONTRAST TECHNIQUE: Multidetector CT imaging of the abdomen and pelvis was performed following the standard protocol without IV contrast. RADIATION DOSE REDUCTION: This exam was performed according to the departmental dose-optimization program which includes automated exposure control, adjustment of the mA and/or kV according to patient size and/or use of iterative reconstruction technique. COMPARISON:  None Available. FINDINGS: Lower chest: Coronary artery calcification. Hepatobiliary: No focal liver abnormality. Status post cholecystectomy. No biliary dilatation. Pancreas: No focal lesion. Normal pancreatic contour. No surrounding inflammatory  changes. No main pancreatic ductal dilatation. Spleen: Normal in size without focal abnormality. Adrenals/Urinary Tract: No adrenal nodule bilaterally. No nephrolithiasis and no hydronephrosis. No definite contour-deforming renal mass. No ureterolithiasis or hydroureter. The urinary bladder is unremarkable. Stomach/Bowel: Stomach is within normal limits. No evidence of bowel wall thickening or dilatation. Marked inflammatory changes in the region of the appendiceal tip. The appendiceal base is enlarged in caliber up to 1.3 cm with associated periappendiceal fat stranding. No appendicolith identified. No definite organized fluid collection. Vascular/Lymphatic: Limited evaluation of a superior mesenteric origin stent. No abdominal aorta or iliac aneurysm. Mild atherosclerotic plaque of the aorta and its branches. No abdominal, pelvic, or inguinal lymphadenopathy. Reproductive: Lobulated uterine contour with multiple mass lesions, some of which are coarsely calcified. Bilateral adnexal regions are unremarkable. Other: No intraperitoneal free fluid. No intraperitoneal free gas. No organized fluid collection. Musculoskeletal: No abdominal wall hernia or abnormality. No suspicious lytic or blastic osseous lesions. No acute displaced fracture. Multilevel degenerative changes of the spine with intervertebral disc space vacuum phenomenon. IMPRESSION: 1. Findings suggestive of perforated acute appendicitis with markedly limited evaluation on this noncontrast study. Recommend surgical consultation. 2. Degenerative uterine fibroids. 3. Limited evaluation of a superior mesenteric origin stent on this noncontrast study. 4.  Aortic Atherosclerosis (ICD10-I70.0). Electronically Signed   By: Iven Finn M.D.   On: 03/14/2022 22:03           Assessment/Plan Perforated appendicitis  -Con't abx -if no improvement may need lap appy vs R hemicolectomy in next 1-2d She last took her Plavix 11/20 p.m. so earliest it could be  is 11/25  At this time recommend antibiotics and hold Plavix if able.  Will follow clinical course and plan for possible laparoscopic appendectomy once Plavix has worn off.   -Patton for clears    FEN: Clears ID: [email or web address] VTE: plavix held. Okay for chemical ppx from gen surgery perspective   Per primary chronic mesenteric ischemia - s/p SMA stent placement 10/29/20 and repeat imaging 06/09/21 with occlusion. IR 07/12/21 SMA stent recanalization and balloon angioplasty of the proximal SMA  HLD HTN hypothyroidism      LOS: 2 days    Michelle Patton 03/16/2022

## 2022-03-17 DIAGNOSIS — K3532 Acute appendicitis with perforation and localized peritonitis, without abscess: Secondary | ICD-10-CM | POA: Diagnosis not present

## 2022-03-17 LAB — BASIC METABOLIC PANEL
Anion gap: 15 (ref 5–15)
BUN: 5 mg/dL — ABNORMAL LOW (ref 8–23)
CO2: 22 mmol/L (ref 22–32)
Calcium: 8.3 mg/dL — ABNORMAL LOW (ref 8.9–10.3)
Chloride: 101 mmol/L (ref 98–111)
Creatinine, Ser: 0.87 mg/dL (ref 0.44–1.00)
GFR, Estimated: >60 mL/min (ref >60)
Glucose, Bld: 97 mg/dL (ref 70–99)
Potassium: 4.4 mmol/L (ref 3.5–5.1)
Sodium: 138 mmol/L (ref 135–145)

## 2022-03-17 LAB — CBC
HCT: 31.3 % — ABNORMAL LOW (ref 36.0–46.0)
Hemoglobin: 10.7 g/dL — ABNORMAL LOW (ref 12.0–15.0)
MCH: 29.6 pg (ref 26.0–34.0)
MCHC: 34.2 g/dL (ref 30.0–36.0)
MCV: 86.7 fL (ref 80.0–100.0)
Platelets: 343 10*3/uL (ref 150–400)
RBC: 3.61 MIL/uL — ABNORMAL LOW (ref 3.87–5.11)
RDW: 11.7 % (ref 11.5–15.5)
WBC: 15.9 10*3/uL — ABNORMAL HIGH (ref 4.0–10.5)
nRBC: 0 % (ref 0.0–0.2)

## 2022-03-17 MED ORDER — METOPROLOL TARTRATE 5 MG/5ML IV SOLN
2.5000 mg | Freq: Four times a day (QID) | INTRAVENOUS | Status: DC
  Administered 2022-03-17 – 2022-03-22 (×20): 2.5 mg via INTRAVENOUS
  Filled 2022-03-17 (×20): qty 5

## 2022-03-17 MED ORDER — LORAZEPAM 2 MG/ML IJ SOLN
0.5000 mg | Freq: Every day | INTRAMUSCULAR | Status: DC
  Administered 2022-03-17: 0.5 mg via INTRAVENOUS
  Filled 2022-03-17: qty 1

## 2022-03-17 NOTE — Progress Notes (Signed)
Subjective No acute events. Feeling better today than yesterday. No n/v.  Objective: Vital signs in last 24 hours: Temp:  [97.6 F (36.4 C)-98.7 F (37.1 C)] 97.6 F (36.4 C) (11/24 0808) Pulse Rate:  [70-87] 80 (11/24 0808) Resp:  [18-20] 18 (11/24 0808) BP: (121-170)/(56-71) 131/68 (11/24 0808) SpO2:  [91 %-97 %] 97 % (11/24 0808) Last BM Date :  (PTA)  Intake/Output from previous day: 11/23 0701 - 11/24 0700 In: 4432.8 [P.O.:720; I.V.:3545.3; IV Piggyback:167.4] Out: -  Intake/Output this shift: Total I/O In: 137.3 [I.V.:124.8; IV Piggyback:12.5] Out: -   Gen: NAD, comfortable CV: RRR Pulm: Normal work of breathing Abd: Soft, mild RLQ tenderness, no tenderness elsewhere; no rebound nor guarding. Nondistended Ext: SCDs in place  Lab Results: CBC  Recent Labs    03/16/22 0259 03/17/22 0337  WBC 20.3* 15.9*  HGB 11.9* 10.7*  HCT 35.4* 31.3*  PLT 365 343   BMET Recent Labs    03/16/22 0259 03/17/22 0337  NA 135 138  K 4.4 4.4  CL 102 101  CO2 19* 22  GLUCOSE 94 97  BUN 8 5*  CREATININE 0.88 0.87  CALCIUM 9.1 8.3*   PT/INR Recent Labs    03/14/22 2020  LABPROT 14.5  INR 1.1   ABG No results for input(s): "PHART", "HCO3" in the last 72 hours.  Invalid input(s): "PCO2", "PO2"  Studies/Results:  Anti-infectives: Anti-infectives (From admission, onward)    Start     Dose/Rate Route Frequency Ordered Stop   03/15/22 0600  piperacillin-tazobactam (ZOSYN) IVPB 3.375 g        3.375 g 12.5 mL/hr over 240 Minutes Intravenous Every 8 hours 03/15/22 0451     03/14/22 2245  piperacillin-tazobactam (ZOSYN) IVPB 3.375 g        3.375 g 100 mL/hr over 30 Minutes Intravenous  Once 03/14/22 2234 03/15/22 0019        Assessment/Plan: Patient Active Problem List   Diagnosis Date Noted   Leukocytosis 03/15/2022   Mixed hyperlipidemia 03/15/2022   Acquired hypothyroidism 03/15/2022   Hypoalbuminemia due to protein-calorie malnutrition ([place])  03/15/2022   Obesity (BMI 30-39.9) 03/15/2022   Acute perforated appendicitis 03/14/2022   Allergy, drug 01/13/2022   Melena 12/06/2021   Prolapsed internal hemorrhoids, grade 3 12/06/2021   Left lower quadrant abdominal pain 12/06/2021   Acute anaphylaxis 10/11/2021   Mesenteric ischemia ([place]) 07/12/2021   Chronic mesenteric ischemia ([place]) 02/22/2021   Elevated LFTs 10/05/2020   Class 2 severe obesity due to excess calories with serious comorbidity and body mass index (BMI) of 36.0 to 36.9 in adult [facility]) 08/09/2020   PAD (peripheral artery disease) ([place]) 08/09/2020   Aortic valve prosthesis present 75/01/2584   Chronic systolic congestive heart failure ([place]) 08/09/2020   OSA and COPD overlap syndrome ([place]) 08/09/2020   s/p right carpal tunnel release 03/02/20 04/06/2020   Closed displaced fracture of proximal phalanx of lesser toe of right foot 12/31/2019   Carpal tunnel syndrome of right wrist 12/05/2019   Nondisplaced fracture of fifth right metatarsal bone with routine healing 10/21/2019   Skin lesion 04/11/2019   Low back pain without sciatica 01/20/2019   Pain of upper abdomen 01/14/2019   Fatigue 12/09/2018   Breast cancer screening 12/09/2018   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 left wrist 10/23/2018   S/P right knee arthroscopy 01/10/18 01/18/2018   Chondromalacia, patella, right    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    Personal history of colonic polyps  12/28/2016   Abdominal pain, epigastric 08/10/2016   Left sided abdominal pain 08/10/2016   Esophageal dysphagia 08/10/2016   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   Supravalvular aortic stenosis, congenital - s/p repair during childhood    Essential hypertension    GERD (gastroesophageal reflux disease)    Aortic stenosis, severe 07/20/2014   Aortic regurgitation 07/20/2014   Leg cramps 09/26/2012   Occlusion and stenosis of  carotid artery without mention of cerebral infarction 07/10/2012   Peripheral vascular disease, unspecified ([place]) 06/12/2012   Carotid artery bruit 06/12/2012   CONSTIPATION 12/28/2009   NAUSEA AND VOMITING 12/28/2009   DIARRHEA 12/28/2009   Assessment/Plan Perforated appendicitis  -Con't abx -if no improvement may need lap appy vs R hemicolectomy in next few days. She last took her Plavix 11/20 p.m. so earliest it could be is 11/25  At this time recommend antibiotics and hold Plavix if able.  Will follow clinical course and plan for possible laparoscopic appendectomy vs repeat CT once Plavix has worn off.   -Clear liquids     FEN: Clears ID: [email or web address] VTE: plavix held. Okay for chemical ppx from gen surgery perspective   Per primary chronic mesenteric ischemia - s/p SMA stent placement 10/29/20 and repeat imaging 06/09/21 with occlusion. IR 07/12/21 SMA stent recanalization and balloon angioplasty of the proximal SMA  HLD HTN hypothyroidism   LOS: 3 days   I spent a total of 35 minutes in both face-to-face and non-face-to-face activities, excluding procedures performed, for this visit on the date of this encounter.  Nadeen Landau, [facility], [place]

## 2022-03-17 NOTE — Progress Notes (Signed)
Pharmacy Consult Note  53 yof with hx of hypothyroidism on armour thyroid '120mg'$  PO daily PTA presenting with perforated appendicitis. Pharmacy consulted to transition armour thyroid to IV due to patient having N/V (last dose 11/23 inpatient PO). Patient reportedly tolerated all PO meds yesterday except oxycodone; however, having N/V to all PO meds today. Equivalent Synthroid PO dose 261mg/day.  Plan: Per P&T policy, IV Synthroid at 75% of PO equivalent dose (1572m IV daily) will start 1 week from last PO dose (on 1289/1Discussed policy with MD   HaArturo MortonPharmD, BCPS Please check AMION for all [place] numbers Clinical Pharmacist 03/17/2022 12:42 PM

## 2022-03-17 NOTE — Progress Notes (Signed)
[facility] Progress Note  Patient: Michelle Patton     SHF:[id number]  DOA: 03/14/2022   PCP: Wendie Agreste, MD       Brief hospital course: This is a 66 year old female with history of mesenteric ischemia, hyperlipidemia, hypertension, hypothyroidism who presents to the hospital for abdominal pain that has been present for about 1-1/2 weeks now.  The patient states that she has chronic abdominal pain however about a week and a half ago this became severe in her right lower abdomen and progressed to a point where she was unable to eat or drink.  She presented to the ED and was found to have perforated acute appendicitis on CT of the abdomen and pelvis, WBC count of 15.9 and was tachycardic. She was started on IV antibiotics and general surgery was consulted.  She was transferred from [facility] to [facility] for further evaluation by the surgical team.  Subjective:  Has ongoing abdominal pain and is unable to tolerate any pills now due to vomiting when she tries.   Assessment and Plan: Principal Problem:   Perforated appendicitis-leukocytosis -She has been evaluated by general surgery and currently the plan is for conservative management - Continue Zosyn  - Continue attempts to control pain and nausea - Plavix on hold for possible surgery - switch all oral meds to IV formulations due to vomiting with oral meds  Active Problems:   Mesenteric ischemia - chronic issue- h/o SMA stenosis, stenting and balloon angioplasty    Essential hypertension - cont Metoprolol - holding Losartan  Mild metabolic acidosis - Without anion gap - Continue to follow    Acquired hypothyroidism - cont NP Thyroid    Hypoalbuminemia - In setting of acute infection - Follow        Code Status: Full Code DVT prophylaxis:  SCDs Start: 03/15/22 0437 Consultants: General surgery Level of Care: Level of care: Med-Surg Total time on patient care: 30   Objective:    Vitals:   03/16/22 1942 03/16/22 2248 03/17/22 0402 03/17/22 0808  BP: (!) 170/71 (!) 147/63 129/70 131/68  Pulse: 82 87 87 80  Resp: '18 18 20 18  '$ Temp: 98.7 F (37.1 C) 98 F (36.7 C) 98 F (36.7 C) 97.6 F (36.4 C)  TempSrc: Oral Oral Oral Oral  SpO2: 95% 95% 93% 97%   There were no vitals filed for this visit. Exam: General exam: Appears comfortable  HEENT: oral mucosa moist Respiratory system: Clear to auscultation.  Cardiovascular system: S1 & S2 heard  Gastrointestinal system: Abdomen soft, she continues to have diffuse abdominal tenderness and moderate distention-bowel sounds are poor     Extremities: No cyanosis, clubbing or edema Psychiatry:  Mood & affect appropriate.      CBC: Recent Labs  Lab 03/14/22 2020 03/15/22 0605 03/16/22 0259 03/17/22 0337  WBC 15.9* 16.2* 20.3* 15.9*  HGB 12.1 11.5* 11.9* 10.7*  HCT 35.6* 34.6* 35.4* 31.3*  MCV 86.8 88.0 87.6 86.7  PLT 370 360 365 502    Basic Metabolic Panel: Recent Labs  Lab 03/14/22 2020 03/15/22 0605 03/16/22 0259 03/17/22 0337  NA 136 137 135 138  K 3.9 3.9 4.4 4.4  CL 102 104 102 101  CO2 24 21* 19* 22  GLUCOSE 98 107* 94 97  BUN 9 7* 8 5*  CREATININE 0.79 0.97 0.88 0.87  CALCIUM 9.0 8.5* 9.1 8.3*  MG  --  1.8  --   --   PHOS  --  3.4  --   --  GFR: Estimated Creatinine Clearance: 74.1 mL/min (by C-G formula based on SCr of 0.87 mg/dL).  Scheduled Meds:  estradiol  2 mg Oral Daily   LORazepam  0.5 mg Intravenous QHS   metoprolol tartrate  2.5 mg Intravenous Q6H   pantoprazole (PROTONIX) IV  40 mg Intravenous Q12H   progesterone  400 mg Oral QHS   thyroid  120 mg Oral q morning   Continuous Infusions:  sodium chloride 150 mL/hr at 03/17/22 0903   piperacillin-tazobactam (ZOSYN)  IV 12.5 mL/hr at 03/17/22 0800   Imaging and lab data was personally reviewed VAS [place] MESENTERIC  Result Date: 03/15/2022 ABDOMINAL VISCERAL Patient Name:  Michelle Patton  Date of Exam:   03/15/2022  Medical Rec #: [id number]         Accession #:    [id number] Date of Birth: June 26, 1955        Patient Gender: F Patient Age:   77 years Exam Location:  [facility] Procedure:      VAS [place] MESENTERIC Referring Phys: [id number] [place] -------------------------------------------------------------------------------- Indications: History of mesenteric ischemia with SMA stent, new onset abdominal              pain High Risk Factors: Hypertension, hyperlipidemia, past history of smoking. Limitations: Air/bowel gas and obesity. Comparison Study: 01-13-2022 Prior Mesenteric Artery Ultrasound showed mid-SMA                   stent velocities of 320 cm/s Performing Technologist: Darlin Coco RDMS, RVT  Examination Guidelines: A complete evaluation includes B-mode imaging, spectral Doppler, color Doppler, and power Doppler as needed of all accessible portions of each vessel. Bilateral testing is considered an integral part of a complete examination. Limited examinations for reoccurring indications may be performed as noted.  Duplex Findings: +----------------------+--------+--------+------+--------+ Mesenteric            PSV cm/sEDV cm/sPlaqueComments +----------------------+--------+--------+------+--------+ Aorta Mid               116                          +----------------------+--------+--------+------+--------+ Celiac Artery Origin    132                          +----------------------+--------+--------+------+--------+ Celiac Artery Proximal  223                          +----------------------+--------+--------+------+--------+ SMA Origin              216                          +----------------------+--------+--------+------+--------+ SMA Proximal            176                          +----------------------+--------+--------+------+--------+ SMA Mid                 489                          +----------------------+--------+--------+------+--------+  SMA Distal               92                          +----------------------+--------+--------+------+--------+  CHA                      93                          +----------------------+--------+--------+------+--------+ Splenic                  88                          +----------------------+--------+--------+------+--------+ IMA                     129                          +----------------------+--------+--------+------+--------+    Summary: Mesenteric:  Today's examination was limited due to overlying bowel gas; however, celiac, IMA, and SMA all appear patent. Velocities of 489 cm/s at the mid-SMA segment suggest some degree of in-stent stenosis.  *See table(s) above for measurements and observations.  Diagnosing physician: Monica Martinez MD  Electronically signed by Monica Martinez MD on 03/15/2022 at 12:44:04 PM.    Final     LOS: 3 days   Author: Debbe Odea  03/17/2022 11:12 AM  To contact [facility]>   Check the care team in [facility] and look for the attending/consulting [facility] provider listed  Log into [email or web address] and use [place]'s universal password   Go to> "[facility]"  and find provider  If you still have difficulty reaching the provider, please page the [facility] (Director on Call) for the Hospitalists listed on amion

## 2022-03-18 DIAGNOSIS — K3532 Acute appendicitis with perforation and localized peritonitis, without abscess: Secondary | ICD-10-CM | POA: Diagnosis not present

## 2022-03-18 LAB — CBC
HCT: 30.1 % — ABNORMAL LOW (ref 36.0–46.0)
Hemoglobin: 10.3 g/dL — ABNORMAL LOW (ref 12.0–15.0)
MCH: 29.2 pg (ref 26.0–34.0)
MCHC: 34.2 g/dL (ref 30.0–36.0)
MCV: 85.3 fL (ref 80.0–100.0)
Platelets: 360 10*3/uL (ref 150–400)
RBC: 3.53 MIL/uL — ABNORMAL LOW (ref 3.87–5.11)
RDW: 11.8 % (ref 11.5–15.5)
WBC: 14.9 10*3/uL — ABNORMAL HIGH (ref 4.0–10.5)
nRBC: 0 % (ref 0.0–0.2)

## 2022-03-18 LAB — BASIC METABOLIC PANEL
Anion gap: 12 (ref 5–15)
BUN: <5 mg/dL — ABNORMAL LOW (ref 8–23)
CO2: 21 mmol/L — ABNORMAL LOW (ref 22–32)
Calcium: 8.1 mg/dL — ABNORMAL LOW (ref 8.9–10.3)
Chloride: 102 mmol/L (ref 98–111)
Creatinine, Ser: 0.82 mg/dL (ref 0.44–1.00)
GFR, Estimated: >60 mL/min (ref >60)
Glucose, Bld: 93 mg/dL (ref 70–99)
Potassium: 3.4 mmol/L — ABNORMAL LOW (ref 3.5–5.1)
Sodium: 135 mmol/L (ref 135–145)

## 2022-03-18 LAB — MAGNESIUM: Magnesium: 1.6 mg/dL — ABNORMAL LOW (ref 1.7–2.4)

## 2022-03-18 MED ORDER — ORAL CARE MOUTH RINSE: 15.0000 mL | OROMUCOSAL | Status: DC | PRN

## 2022-03-18 MED ORDER — LORAZEPAM 2 MG/ML IJ SOLN
0.2500 mg | Freq: Every day | INTRAMUSCULAR | Status: DC
  Administered 2022-03-18 – 2022-03-20 (×3): 0.25 mg via INTRAVENOUS
  Filled 2022-03-18 (×3): qty 1

## 2022-03-18 MED ORDER — POTASSIUM CHLORIDE 10 MEQ/100ML IV SOLN
10.0000 meq | INTRAVENOUS | Status: AC
  Administered 2022-03-18 (×5): 10 meq via INTRAVENOUS
  Filled 2022-03-18 (×5): qty 100

## 2022-03-18 MED ORDER — MAGNESIUM SULFATE 2 GM/50ML IV SOLN
2.0000 g | Freq: Once | INTRAVENOUS | Status: AC
  Administered 2022-03-18: 2 g via INTRAVENOUS
  Filled 2022-03-18: qty 50

## 2022-03-18 NOTE — Progress Notes (Signed)
[facility] Progress Note  Patient: Michelle Patton     WVP:[id number]  DOA: 03/14/2022   PCP: Wendie Agreste, MD       Brief hospital course: This is a 66 year old female with history of mesenteric ischemia, hyperlipidemia, hypertension, hypothyroidism who presents to the hospital for abdominal pain that has been present for about 1-1/2 weeks now.  The patient states that she has chronic abdominal pain however about a week and a half ago this became severe in her right lower abdomen and progressed to a point where she was unable to eat or drink.  She presented to the ED and was found to have perforated acute appendicitis on CT of the abdomen and pelvis, WBC count of 15.9 and was tachycardic. She was started on IV antibiotics and general surgery was consulted.  She was transferred from [facility] to [facility] for further evaluation by the surgical team.  Subjective:  Continues to have abdominal pain, nausea and vomiting.   Assessment and Plan: Principal Problem:   Perforated appendicitis-leukocytosis -She has been evaluated by general surgery and currently the plan is for conservative management - Continue Zosyn  - Plavix on hold for possible surgery - Continue attempts to control pain and nausea - have switched all oral meds to IV formulations due to vomiting  - general surgery has ordered clear liquids  Active Problems:  Hypokalemia - replace - check Mg+    Mesenteric ischemia - chronic issue- h/o SMA stenosis, stenting and balloon angioplasty    Essential hypertension - cont Metoprolol - holding Losartan  Mild metabolic acidosis - Without anion gap - Continue to follow    Acquired hypothyroidism - cont NP Thyroid    Hypoalbuminemia - In setting of acute infection - Follow        Code Status: Full Code DVT prophylaxis:  SCDs Start: 03/15/22 0437 Consultants: General surgery Level of Care: Level of care: Med-Surg Total time on  patient care: 30   Objective:   Vitals:   03/17/22 1958 03/17/22 2318 03/18/22 0336 03/18/22 0937  BP: (!) 162/77 139/67 (!) 168/64 (!) 163/72  Pulse: 92 94 94 91  Resp: '20 20 20 18  '$ Temp: 97.6 F (36.4 C) (!) 97.5 F (36.4 C) 97.6 F (36.4 C) 98 F (36.7 C)  TempSrc: Oral Oral Oral Oral  SpO2: 92% 95% 92% 95%   There were no vitals filed for this visit. Exam: General exam: Appears comfortable  HEENT: oral mucosa moist Respiratory system: Clear to auscultation.  Cardiovascular system: S1 & S2 heard  Gastrointestinal system: Abdomen soft, moderate to severe abdominal distension with diffuse tenderness, normal BS Extremities: No cyanosis, clubbing or edema Psychiatry:  Mood & affect appropriate.      CBC: Recent Labs  Lab 03/14/22 2020 03/15/22 0605 03/16/22 0259 03/17/22 0337 03/18/22 0733  WBC 15.9* 16.2* 20.3* 15.9* 14.9*  HGB 12.1 11.5* 11.9* 10.7* 10.3*  HCT 35.6* 34.6* 35.4* 31.3* 30.1*  MCV 86.8 88.0 87.6 86.7 85.3  PLT 370 360 365 343 546    Basic Metabolic Panel: Recent Labs  Lab 03/14/22 2020 03/15/22 0605 03/16/22 0259 03/17/22 0337 03/18/22 0733  NA 136 137 135 138 135  K 3.9 3.9 4.4 4.4 3.4*  CL 102 104 102 101 102  CO2 24 21* 19* 22 21*  GLUCOSE 98 107* 94 97 93  BUN 9 7* 8 5* <5*  CREATININE 0.79 0.97 0.88 0.87 0.82  CALCIUM 9.0 8.5* 9.1 8.3* 8.1*  MG  --  1.8  --   --   --   PHOS  --  3.4  --   --   --     GFR: Estimated Creatinine Clearance: 78.6 mL/min (by C-G formula based on SCr of 0.82 mg/dL).  Scheduled Meds:  estradiol  2 mg Oral Daily   LORazepam  0.25 mg Intravenous QHS   metoprolol tartrate  2.5 mg Intravenous Q6H   pantoprazole (PROTONIX) IV  40 mg Intravenous Q12H   progesterone  400 mg Oral QHS   thyroid  120 mg Oral q morning   Continuous Infusions:  sodium chloride 150 mL/hr at 03/18/22 0706   piperacillin-tazobactam (ZOSYN)  IV 3.375 g (03/18/22 0705)   Imaging and lab data was personally reviewed No  results found.  LOS: 4 days   Author: Debbe Odea  03/18/2022 10:59 AM  To contact [facility]>   Check the care team in [facility] and look for the attending/consulting [place] provider listed  Log into [email or web address] and use [place]'s universal password   Go to> "[facility]"  and find provider  If you still have difficulty reaching the provider, please page the [facility] (Director on Call) for the Hospitalists listed on amion

## 2022-03-18 NOTE — Progress Notes (Signed)
   Subjective/Chief Complaint: Pt with no acute changes Some abd pain   Objective: Vital signs in last 24 hours: Temp:  [97.5 F (36.4 C)-98.1 F (36.7 C)] 97.6 F (36.4 C) (11/25 0336) Pulse Rate:  [85-94] 94 (11/25 0336) Resp:  [18-20] 20 (11/25 0336) BP: (139-168)/(64-77) 168/64 (11/25 0336) SpO2:  [92 %-99 %] 92 % (11/25 0336) Last BM Date :  (PTA)  Intake/Output from previous day: 11/24 0701 - 11/25 0700 In: 3376.2 [P.O.:240; I.V.:2952.1; IV Piggyback:184.1] Out: 1937 [Urine:1340] Intake/Output this shift: No intake/output data recorded.  PE:  Constitutional: No acute distress, conversant, appears states age. Eyes: Anicteric sclerae, moist conjunctiva, no lid lag Lungs: Clear to auscultation bilaterally, normal respiratory effort CV: regular rate and rhythm, no murmurs, no peripheral edema, pedal pulses 2+ GI: Soft, no masses or hepatosplenomegaly,  min tender to palpation RLQ Skin: No rashes, palpation reveals normal turgor Psychiatric: appropriate judgment and insight, oriented to person, place, and time   Lab Results:  Recent Labs    03/17/22 0337 03/18/22 0733  WBC 15.9* 14.9*  HGB 10.7* 10.3*  HCT 31.3* 30.1*  PLT 343 360   BMET Recent Labs    03/17/22 0337 03/18/22 0733  NA 138 135  K 4.4 3.4*  CL 101 102  CO2 22 21*  GLUCOSE 97 93  BUN 5* <5*  CREATININE 0.87 0.82  CALCIUM 8.3* 8.1*   Anti-infectives: Anti-infectives (From admission, onward)    Start     Dose/Rate Route Frequency Ordered Stop   03/15/22 0600  piperacillin-tazobactam (ZOSYN) IVPB 3.375 g        3.375 g 12.5 mL/hr over 240 Minutes Intravenous Every 8 hours 03/15/22 0451     03/14/22 2245  piperacillin-tazobactam (ZOSYN) IVPB 3.375 g        3.375 g 100 mL/hr over 30 Minutes Intravenous  Once 03/14/22 2234 03/15/22 0019       Assessment/Plan: Perforated appendicitis  -Con't abx -if no improvement may need lap appy vs R hemicolectomy in next few days. She last  took her Plavix 11/20 p.m. so earliest it could be is 11/25  At this time recommend antibiotics and hold Plavix if able.  Will follow clinical course.  Ideally pt would be treated with interval appendectomy unless fails non op mgmt -Clear liquids as tol     FEN: Clears ID: [email or web address] VTE: plavix held. Okay for chemical ppx from gen surgery perspective   Per primary chronic mesenteric ischemia - s/p SMA stent placement 10/29/20 and repeat imaging 06/09/21 with occlusion. IR 07/12/21 SMA stent recanalization and balloon angioplasty of the proximal SMA  HLD HTN hypothyroidism  LOS: 4 days    Ralene Ok 03/18/2022

## 2022-03-18 NOTE — Progress Notes (Signed)
[facility] Progress Note  Patient: Michelle Patton     GYF:[id number]  DOA: 03/14/2022   PCP: Wendie Agreste, MD       Brief hospital course: This is a 66 year old female with history of mesenteric ischemia, hyperlipidemia, hypertension, hypothyroidism who presents to the hospital for abdominal pain that has been present for about 1-1/2 weeks now.  The patient states that she has chronic abdominal pain however about a week and a half ago this became severe in her right lower abdomen and progressed to a point where she was unable to eat or drink.  She presented to the ED and was found to have perforated acute appendicitis on CT of the abdomen and pelvis, WBC count of 15.9 and was tachycardic. She was started on IV antibiotics and general surgery was consulted.  She was transferred from [facility] to [facility] for further evaluation by the surgical team.  Addendum: I have spoken with Dr Ramirex today and have asked if he will take over care- He has agreed. Will sign off- please recall as needed.   Subjective:  Continues to have abdominal pain, nausea and vomiting.   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, stenting and balloon angioplasty    Essential hypertension - cont Metoprolol - holding Losartan  Mild metabolic acidosis - Without anion gap - Continue to follow    Acquired hypothyroidism - cont NP Thyroid    Hypoalbuminemia - In setting of acute infection - Follow        Code  Status: Full Code DVT prophylaxis:  SCDs Start: 03/15/22 0437 Consultants: General surgery Level of Care: Level of care: Med-Surg Total time on patient care: 30   Objective:   Vitals:   03/17/22 2318 03/18/22 0336 03/18/22 0937 03/18/22 1115  BP: 139/67 (!) 168/64 (!) 163/72 (!) 152/79  Pulse: 94 94 91 96  Resp: '20 20 18 20  '$ Temp: (!) 97.5 F (36.4 C) 97.6 F (36.4 C) 98 F (36.7 C) 98.1 F (36.7 C)  TempSrc: Oral Oral Oral Oral  SpO2: 95% 92% 95% 93%   There were no vitals filed for this visit. Exam: General exam: Appears comfortable  HEENT: oral mucosa moist Respiratory system: Clear to auscultation.  Cardiovascular system: S1 & S2 heard  Gastrointestinal system: Abdomen soft, moderate to severe abdominal distension with diffuse tenderness, normal BS Extremities: No cyanosis, clubbing or edema Psychiatry:  Mood & affect appropriate.      CBC: Recent Labs  Lab 03/14/22 2020 03/15/22 0605 03/16/22 0259 03/17/22 0337 03/18/22 0733  WBC 15.9* 16.2* 20.3* 15.9* 14.9*  HGB 12.1 11.5* 11.9* 10.7* 10.3*  HCT 35.6* 34.6* 35.4* 31.3* 30.1*  MCV 86.8 88.0 87.6 86.7 85.3  PLT 370 360 365 343 916    Basic Metabolic Panel: Recent Labs  Lab 03/14/22 2020 03/15/22 0605 03/16/22 0259 03/17/22 0337 03/18/22 0733  NA 136 137 135 138 135  K 3.9 3.9 4.4 4.4 3.4*  CL 102 104 102 101 102  CO2 24 21* 19* 22 21*  GLUCOSE 98 107* 94 97 93  BUN 9 7* 8 5* <5*  CREATININE 0.79 0.97 0.88 0.87 0.82  CALCIUM 9.0 8.5* 9.1 8.3* 8.1*  MG  --  1.8  --   --   --   PHOS  --  3.4  --   --   --     GFR: Estimated Creatinine Clearance: 78.6 mL/min (by C-G formula based on SCr of 0.82 mg/dL).  Scheduled Meds:  estradiol  2 mg Oral Daily   LORazepam  0.25 mg Intravenous QHS   metoprolol tartrate  2.5 mg Intravenous Q6H   pantoprazole (PROTONIX) IV  40 mg Intravenous Q12H   progesterone  400 mg Oral QHS   thyroid  120 mg Oral q morning   Continuous Infusions:  sodium chloride  150 mL/hr at 03/18/22 0706   piperacillin-tazobactam (ZOSYN)  IV 3.375 g (03/18/22 0705)   potassium chloride 10 mEq (03/18/22 1248)   Imaging and lab data was personally reviewed No results found.  LOS: 4 days   Author: Debbe Odea  03/18/2022 1:26 PM  To contact [facility]>   Check the care team in [facility] and look for the attending/consulting [facility] provider listed  Log into [email or web address] and use [place]'s universal password   Go to> "[facility]"  and find provider  If you still have difficulty reaching the provider, please page the [facility] (Director on Call) for the Hospitalists listed on amion

## 2022-03-19 ENCOUNTER — Inpatient Hospital Stay (HOSPITAL_COMMUNITY): Payer: Medicare Other

## 2022-03-19 LAB — CBC
HCT: 26.5 % — ABNORMAL LOW (ref 36.0–46.0)
Hemoglobin: 9.2 g/dL — ABNORMAL LOW (ref 12.0–15.0)
MCH: 29.7 pg (ref 26.0–34.0)
MCHC: 34.7 g/dL (ref 30.0–36.0)
MCV: 85.5 fL (ref 80.0–100.0)
Platelets: 333 10*3/uL (ref 150–400)
RBC: 3.1 MIL/uL — ABNORMAL LOW (ref 3.87–5.11)
RDW: 11.9 % (ref 11.5–15.5)
WBC: 12.3 10*3/uL — ABNORMAL HIGH (ref 4.0–10.5)
nRBC: 0 % (ref 0.0–0.2)

## 2022-03-19 LAB — BASIC METABOLIC PANEL
Anion gap: 9 (ref 5–15)
BUN: <5 mg/dL — ABNORMAL LOW (ref 8–23)
CO2: 23 mmol/L (ref 22–32)
Calcium: 7.9 mg/dL — ABNORMAL LOW (ref 8.9–10.3)
Chloride: 103 mmol/L (ref 98–111)
Creatinine, Ser: 0.87 mg/dL (ref 0.44–1.00)
GFR, Estimated: >60 mL/min (ref >60)
Glucose, Bld: 85 mg/dL (ref 70–99)
Potassium: 3.7 mmol/L (ref 3.5–5.1)
Sodium: 135 mmol/L (ref 135–145)

## 2022-03-19 MED ORDER — METHYLPREDNISOLONE SODIUM SUCC 125 MG IJ SOLR: 40.0000 mg | Freq: Once | INTRAMUSCULAR | Status: DC

## 2022-03-19 MED ORDER — DIPHENHYDRAMINE HCL 25 MG PO CAPS: 50.0000 mg | ORAL_CAPSULE | Freq: Once | ORAL | Status: DC

## 2022-03-19 MED ORDER — DIPHENHYDRAMINE HCL 50 MG/ML IJ SOLN: 50.0000 mg | Freq: Once | INTRAMUSCULAR | Status: DC

## 2022-03-19 NOTE — Progress Notes (Signed)
   Subjective/Chief Complaint: Still with rlq ab pain, having flatus, no n/v   Objective: Vital signs in last 24 hours: Temp:  [97.7 F (36.5 C)-98.6 F (37 C)] 97.7 F (36.5 C) (11/26 0842) Pulse Rate:  [89-96] 89 (11/26 0842) Resp:  [14-20] 14 (11/26 0842) BP: (126-164)/(60-79) 164/73 (11/26 0842) SpO2:  [92 %-96 %] 94 % (11/26 0842) Last BM Date :  (PTA)  Intake/Output from previous day: 11/25 0701 - 11/26 0700 In: 3778.2 [I.V.:3089.8; IV Piggyback:688.5] Out: -  Intake/Output this shift: No intake/output data recorded.  Ab soft tender rlq to palpation  Lab Results:  Recent Labs    03/18/22 0733 03/19/22 0420  WBC 14.9* 12.3*  HGB 10.3* 9.2*  HCT 30.1* 26.5*  PLT 360 333   BMET Recent Labs    03/18/22 0733 03/19/22 0420  NA 135 135  K 3.4* 3.7  CL 102 103  CO2 21* 23  GLUCOSE 93 85  BUN <5* <5*  CREATININE 0.82 0.87  CALCIUM 8.1* 7.9*   PT/INR No results for input(s): "LABPROT", "INR" in the last 72 hours. ABG No results for input(s): "PHART", "HCO3" in the last 72 hours.  Invalid input(s): "PCO2", "PO2"  Studies/Results: No results found.  Anti-infectives: Anti-infectives (From admission, onward)    Start     Dose/Rate Route Frequency Ordered Stop   03/15/22 0600  piperacillin-tazobactam (ZOSYN) IVPB 3.375 g        3.375 g 12.5 mL/hr over 240 Minutes Intravenous Every 8 hours 03/15/22 0451     03/14/22 2245  piperacillin-tazobactam (ZOSYN) IVPB 3.375 g        3.375 g 100 mL/hr over 30 Minutes Intravenous  Once 03/14/22 2234 03/15/22 0019       Assessment/Plan: Likely perforated appendicitis -being managed nonoperatively.  May just need surgery due to tenderness and still has wbc mildly elevated. Will premed today and repeat a ct scan prior to any decision making   Michelle Patton 03/19/2022

## 2022-03-20 ENCOUNTER — Encounter (HOSPITAL_COMMUNITY): Payer: Self-pay | Admitting: Internal Medicine

## 2022-03-20 ENCOUNTER — Encounter: Payer: Self-pay | Admitting: Family Medicine

## 2022-03-20 LAB — CBC
HCT: 28.4 % — ABNORMAL LOW (ref 36.0–46.0)
Hemoglobin: 9.8 g/dL — ABNORMAL LOW (ref 12.0–15.0)
MCH: 29.3 pg (ref 26.0–34.0)
MCHC: 34.5 g/dL (ref 30.0–36.0)
MCV: 85 fL (ref 80.0–100.0)
Platelets: 379 10*3/uL (ref 150–400)
RBC: 3.34 MIL/uL — ABNORMAL LOW (ref 3.87–5.11)
RDW: 11.9 % (ref 11.5–15.5)
WBC: 11.5 10*3/uL — ABNORMAL HIGH (ref 4.0–10.5)
nRBC: 0 % (ref 0.0–0.2)

## 2022-03-20 LAB — BASIC METABOLIC PANEL
Anion gap: 9 (ref 5–15)
BUN: <5 mg/dL — ABNORMAL LOW (ref 8–23)
CO2: 23 mmol/L (ref 22–32)
Calcium: 7.9 mg/dL — ABNORMAL LOW (ref 8.9–10.3)
Chloride: 101 mmol/L (ref 98–111)
Creatinine, Ser: 0.72 mg/dL (ref 0.44–1.00)
GFR, Estimated: >60 mL/min (ref >60)
Glucose, Bld: 114 mg/dL — ABNORMAL HIGH (ref 70–99)
Potassium: 3.1 mmol/L — ABNORMAL LOW (ref 3.5–5.1)
Sodium: 133 mmol/L — ABNORMAL LOW (ref 135–145)

## 2022-03-20 MED ORDER — OXYCODONE HCL 5 MG PO TABS
5.0000 mg | ORAL_TABLET | ORAL | Status: DC | PRN
  Administered 2022-03-20 – 2022-03-22 (×7): 10 mg via ORAL
  Filled 2022-03-20 (×7): qty 2

## 2022-03-20 MED ORDER — ONDANSETRON 4 MG PO TBDP
4.0000 mg | ORAL_TABLET | Freq: Four times a day (QID) | ORAL | Status: DC | PRN
  Administered 2022-03-21: 4 mg via ORAL
  Filled 2022-03-20: qty 1

## 2022-03-20 MED ORDER — PREDNISONE 5 MG PO TABS
50.0000 mg | ORAL_TABLET | Freq: Once | ORAL | Status: AC
  Administered 2022-03-20: 50 mg via ORAL
  Filled 2022-03-20: qty 2

## 2022-03-20 MED ORDER — LOSARTAN POTASSIUM 25 MG PO TABS
25.0000 mg | ORAL_TABLET | Freq: Every day | ORAL | Status: DC
  Administered 2022-03-20 – 2022-03-22 (×3): 25 mg via ORAL
  Filled 2022-03-20 (×3): qty 1

## 2022-03-20 MED ORDER — PREDNISONE 5 MG PO TABS
50.0000 mg | ORAL_TABLET | Freq: Once | ORAL | Status: AC
  Administered 2022-03-21: 50 mg via ORAL
  Filled 2022-03-20: qty 2

## 2022-03-20 MED ORDER — PREDNISONE 5 MG PO TABS
50.0000 mg | ORAL_TABLET | Freq: Once | ORAL | Status: AC
  Administered 2022-03-21: 50 mg via ORAL
  Filled 2022-03-20 (×2): qty 2

## 2022-03-20 MED ORDER — DIPHENHYDRAMINE HCL 50 MG/ML IJ SOLN: 50.0000 mg | Freq: Once | INTRAMUSCULAR | Status: AC

## 2022-03-20 MED ORDER — DIPHENHYDRAMINE HCL 25 MG PO CAPS
50.0000 mg | ORAL_CAPSULE | Freq: Once | ORAL | Status: AC
  Administered 2022-03-21: 50 mg via ORAL
  Filled 2022-03-20: qty 2

## 2022-03-20 MED ORDER — ONDANSETRON HCL 4 MG/2ML IJ SOLN
4.0000 mg | Freq: Four times a day (QID) | INTRAMUSCULAR | Status: DC | PRN
  Administered 2022-03-20 (×2): 4 mg via INTRAVENOUS
  Filled 2022-03-20 (×2): qty 2

## 2022-03-20 MED ORDER — DICYCLOMINE HCL 10 MG PO CAPS: 10.0000 mg | ORAL_CAPSULE | Freq: Three times a day (TID) | ORAL | Status: DC | PRN

## 2022-03-20 MED ORDER — ACETAMINOPHEN 500 MG PO TABS
1000.0000 mg | ORAL_TABLET | Freq: Four times a day (QID) | ORAL | Status: DC
  Administered 2022-03-20 – 2022-03-22 (×5): 1000 mg via ORAL
  Filled 2022-03-20 (×9): qty 2

## 2022-03-20 NOTE — Progress Notes (Signed)
   03/20/22 0326  Charting Type  Charting Type Full Reassessment Changes Noted  Focused Reassessment No Changes Musculoskeletal  LUE Neurovascular Assessment  LUE Capillary Refill  Less than/equal to 3 seconds  LUE Color  Appropriate for ethnicity  LUE Temperature/Moisture  Warm;Dry  L Radial Pulse +2     Patient reports acute discomfort to RUE/wrist area. Does not denny sleeping on the extremity. Ice applied and elevation suggested. Will monitor for any changes.

## 2022-03-20 NOTE — Consult Note (Addendum)
Chief Complaint: Patient was seen in consultation today for mesenteric ischemia  Referring Physician(s): Dr. Ralene Ok  Supervising Physician: Ruthann Cancer  Patient Status: [facility] - In-pt  History of Present Illness: Michelle Patton is a 66 y.o. 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  She underwent SMA covered balloon-expandable stent placement in 10/29/20 with Dr. Serafina Royals.  This stent became occluded in March 2023 and she subsequently underwent  atherectomy recanalization with aspiration thrombectomy of the occluded stent as well as balloon angioplasty of the proximal uncovered superior mesenteric artery. She has continued to follow with Dr. Serafina Royals as well as GI over the past several months.  Most recently she was contacted by [facility] 03/02/22 at which time she endorsed 7/10 abdominal pain with concern for re-stenosis of SMA stent or worsening celiac artery stenosis and was scheduled for intervention in December.  Unfortunately, she continued to have worsening pain and was sent to the ED by her GI.  CT Abdomen Pelvis showed perforated appendicitis. No changes were made initially to her plan for recanalization which remained planned as an outpatient, however her recovery has been prolonged with questionable candidacy for surgery.  In light of her known poor perfusion, IR consideration for proceeding with atherectomy, angioplasty, and stenting as an inpatient revisited.    Case discussed by Dr. Rosendo Gros and Dr. Serafina Royals.  Patient assessed at bedside.  All are in agreement to proceed with intervention.   Past Medical History:  Diagnosis Date   Aortic regurgitation 07/20/2014   Aortic stenosis, severe 07/20/2014   Arthritis    DVT (deep venous thrombosis) (HCC)    Family history of adverse reaction to anesthesia    Reports father deliurm in his 32's with CABG    GERD (gastroesophageal reflux disease)    History of pneumonia    Hyperlipidemia    Hypertension    Insomnia, unspecified    Muscle weakness (generalized)    Peripheral artery disease (HCC)    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   Redo sternotomy for 21 mm Medtronic Freestyle porcine aortic root graft w/ reimplantation of left main and right coronary arteries   Sleep apnea    diagnosed multiple years ago at [facility] aortic stenosis, congenital - s/p repair during childhood     Past Surgical History:  Procedure Laterality Date   ABDOMINAL AORTAGRAM  06/24/2012   ABDOMINAL AORTAGRAM N/A 06/24/2012   Procedure: ABDOMINAL Maxcine Ham;  Surgeon: Angelia Mould, MD;  Location: [facility] CATH LAB;  Service: Cardiovascular;  Laterality: N/A;   AORTIC VALVE REPLACEMENT N/A 10/07/2014   Procedure: REDO AORTIC VALVE REPLACEMENT (AVR);  Surgeon: Rexene Alberts, MD;  Location: [place];  Service: Open Heart Surgery;  Laterality: N/A;   ASCENDING AORTIC ROOT REPLACEMENT N/A 10/07/2014   Procedure: ASCENDING AORTIC ROOT REPLACEMENT;  Surgeon: Rexene Alberts, MD;  Location: [place];  Service: Open Heart Surgery;  Laterality: N/A;   BIOPSY  09/27/2016   Procedure: BIOPSY;  Surgeon: Daneil Dolin, MD;  [facility];  Service: Endoscopy;;  colon   BIOPSY  10/18/2020   Procedure: BIOPSY;  Surgeon: Eloise Harman, DO;  [facility];  Service: Endoscopy;;   BREAST REDUCTION SURGERY Bilateral 01/21/2018   Procedure: BREAST REDUCTION WITH LIPOSUCTION;  Surgeon: Cristine Polio, MD;  Location: [place];  Service: Clinical cytogeneticist;  Laterality: Bilateral;   CARDIAC VALVE SURGERY  1968   CARPAL TUNNEL RELEASE Right 03/02/2020   Procedure: CARPAL TUNNEL RELEASE;  Surgeon: Carole Civil, MD;  [facility]: [facility];  Service: Orthopedics;  Laterality: Right;   CATARACT EXTRACTION W/PHACO Left 05/30/2019   Procedure:  CATARACT EXTRACTION PHACO AND INTRAOCULAR LENS PLACEMENT (IOC) (CDE: 4.94  );  Surgeon: Baruch Goldmann, MD;  Location: [facility];  Service: Ophthalmology;  Laterality: Left;   CATARACT EXTRACTION W/PHACO Right 06/16/2019   Procedure: CATARACT EXTRACTION PHACO AND INTRAOCULAR LENS PLACEMENT (IOC);  Surgeon: Baruch Goldmann, MD;  [facility];  Service: Ophthalmology;  Laterality: Right;  CDE: 4.64   CERVICAL FUSION     CHOLECYSTECTOMY     COLONOSCOPY WITH PROPOFOL N/A 09/27/2016   Dr. Gala Romney: Diverticulosis, several tubular adenomas removed ranging 4 to 7 mm in size, internal grade 1 hemorrhoids, terminal ileum normal, segmental biopsies negative for microscopic colitis.  Next colonoscopy June 2021   COLONOSCOPY WITH PROPOFOL N/A 05/06/2020   internal hemorrhoids, sigmoid diverticulosis. Surveillance colonoscopy due in 2027   ESOPHAGOGASTRODUODENOSCOPY (EGD) WITH PROPOFOL N/A 09/27/2016   Dr. Gala Romney: Small hiatal hernia, mild Schatzki ring status post disruption, LA grade a esophagitis   ESOPHAGOGASTRODUODENOSCOPY (EGD) WITH PROPOFOL N/A 10/18/2020   non-obstructing mild Schatzki ring, gastritis s/ biopsy. Negative H.pylori.   ILIAC ARTERY STENT Left 12/2007   IR ANGIOGRAM EXTREMITY BILATERAL  07/12/2021   IR ANGIOGRAM VISCERAL SELECTIVE  10/29/2020   IR ANGIOGRAM VISCERAL SELECTIVE  07/12/2021   IR IVUS EACH ADDITIONAL NON CORONARY VESSEL  07/12/2021   IR RADIOLOGIST EVAL & MGMT  10/26/2020   IR RADIOLOGIST EVAL & MGMT  11/10/2020   IR RADIOLOGIST EVAL & MGMT  02/09/2021   IR RADIOLOGIST EVAL & MGMT  06/16/2021   IR RADIOLOGIST EVAL & MGMT  08/15/2021   IR RADIOLOGIST EVAL & MGMT  10/18/2021   IR RADIOLOGIST EVAL & MGMT  01/11/2022   IR RADIOLOGIST EVAL & MGMT  03/02/2022   IR THROMBECT SEC MECH MOD SED  07/12/2021   IR TRANSCATH PLC STENT 1ST ART NOT LE CV CAR VERT CAR  10/29/2020   IR TRANSCATH PLC STENT 1ST ART NOT LE CV CAR VERT CAR  07/12/2021   IR US GUIDE VASC ACCESS RIGHT  10/29/2020   IR  US GUIDE VASC ACCESS RIGHT  07/12/2021   KNEE ARTHROSCOPY WITH MEDIAL MENISECTOMY Right 01/10/2018   Procedure: RIGHT KNEE ARTHROSCOPY WITH PARTIAL MEDIAL MENISECTOMY;  Surgeon: Carole Civil, MD;  [facility];  Service: Orthopedics;  Laterality: Right;   LEFT AND RIGHT HEART CATHETERIZATION WITH CORONARY ANGIOGRAM N/A 07/31/2014   Procedure: LEFT AND RIGHT HEART CATHETERIZATION WITH CORONARY ANGIOGRAM;  Surgeon: Burnell Blanks, MD;  Location: [facility] CATH LAB;  Service: Cardiovascular;  Laterality: N/A;   MALONEY DILATION N/A 09/27/2016   Procedure: Venia Minks DILATION;  Surgeon: Daneil Dolin, MD;  [facility];  Service: Endoscopy;  Laterality: N/A;   POLYPECTOMY  09/27/2016   Procedure: POLYPECTOMY;  Surgeon: Daneil Dolin, MD;  [facility];  Service: Endoscopy;;  colon   ROTATOR CUFF REPAIR Bilateral    TEE WITHOUT CARDIOVERSION N/A 07/07/2014   Procedure: TRANSESOPHAGEAL ECHOCARDIOGRAM (TEE);  Surgeon: Arnoldo Lenis, MD;  [facility];  Service: Cardiology;  Laterality: N/A;   TEE WITHOUT CARDIOVERSION N/A 07/20/2014   Procedure: TRANSESOPHAGEAL ECHOCARDIOGRAM (TEE) WITH PROPOFOL;  Surgeon: Arnoldo Lenis, MD;  [facility];  Service: Endoscopy;  Laterality: N/A;   TEE WITHOUT CARDIOVERSION N/A 10/07/2014   Procedure: TRANSESOPHAGEAL ECHOCARDIOGRAM (TEE);  Surgeon: Rexene Alberts, MD;  Location: [place];  Service: Open Heart Surgery;  Laterality: N/A;    Allergies: Iodinated contrast media and Sulfa antibiotics  Medications: Prior to Admission medications   Medication Sig Start Date End Date Taking? Authorizing Provider  acetaminophen (TYLENOL) 325 MG tablet Take 650 mg by mouth every 6 (six) hours as needed for mild pain, moderate pain, fever or headache.   Yes [provider]  aspirin EC 81 MG tablet Take 1 tablet (81 mg total) by mouth daily. Swallow whole. Patient taking differently: Take 81 mg by mouth in the  morning. 12/16/19  Yes Branch, Alphonse Guild, MD  Cholecalciferol (VITAMIN D-3 PO) Take 1 capsule by mouth in the morning.   Yes [provider]  clopidogrel (PLAVIX) 75 MG tablet Take 1 tablet (75 mg total) by mouth daily. Patient taking differently: Take 75 mg by mouth at bedtime. 07/13/21  Yes Covington, Roselyn Reef R, NP  diclofenac (VOLTAREN) 75 MG EC tablet TAKE 1 TABLET TWICE A DAY Patient taking differently: Take 75 mg by mouth 2 (two) times daily. 03/03/21  Yes Carole Civil, MD  estradiol (ESTRACE) 2 MG tablet Take 1 tablet (2 mg total) by mouth daily. Patient taking differently: Take 2 mg by mouth in the morning. 01/19/21  Yes Wendie Agreste, MD  ezetimibe (ZETIA) 10 MG tablet Take 1 tablet (10 mg total) by mouth daily. Patient taking differently: Take 10 mg by mouth in the morning. 01/13/22 01/08/23 Yes Dunn, Dayna N, PA-C  Flaxseed, Linseed, (FLAX SEED OIL PO) Take 1 capsule by mouth in the morning.   Yes [provider]  hydrocortisone (ANUSOL-HC) 2.5 % rectal cream Place 1 Application rectally 2 (two) times daily. As needed for rectal bleeding. Patient taking differently: Place 1 Application rectally 2 (two) times daily as needed for hemorrhoids (rectal bleeding). 12/06/21  Yes Annitta Needs, NP  losartan (COZAAR) 25 MG tablet TAKE 1 TABLET BY MOUTH DAILY Patient taking differently: Take 25 mg by mouth in the morning. 06/27/21  Yes Wendie Agreste, MD  metoprolol tartrate (LOPRESSOR) 25 MG tablet TAKE 1 TABLET TWICE A DAY 02/24/22  Yes Branch, Alphonse Guild, MD  Multiple Minerals (CALCIUM-MAGNESIUM-ZINC) TABS Take 1 tablet by mouth in the morning.   Yes [provider]  Multiple Vitamin (MULTIVITAMIN WITH MINERALS) TABS tablet Take 1 tablet by mouth in the morning.   Yes [provider]  NP THYROID 120 MG tablet Take 120 mg by mouth daily before breakfast. 01/11/22  Yes [provider]  progesterone (PROMETRIUM) 200 MG capsule Take 2 capsules (400  mg total) by mouth daily. Patient taking differently: Take 400 mg by mouth at bedtime. 01/19/21 04/29/22 Yes Wendie Agreste, MD  RABEprazole (ACIPHEX) 20 MG tablet Take 1 tablet (20 mg total) by mouth 2 (two) times daily before a meal. 02/27/22  Yes Mahon, Courtney L, NP  rosuvastatin (CRESTOR) 40 MG tablet TAKE 1 TABLET DAILY Patient taking differently: Take 40 mg by mouth at bedtime. 01/23/22  Yes BranchAlphonse Guild, MD  triamcinolone ointment (KENALOG) 0.1 % Apply 1 Application topically 2 (two) times daily as needed. Patient taking differently: Apply 1 Application topically 2 (two) times daily as needed (irritation). 11/25/21  Yes Valentina Shaggy, MD  dicyclomine (BENTYL) 10 MG capsule Take 1 capsule (10 mg total) by mouth every 8 (eight) hours as needed for spasms. For  left-sided discomfort and frequent stools. Watch for constipation, dry mouth, dizziness. Patient not taking: Reported on 03/15/2022 12/06/21   Annitta Needs, NP     Family History  Problem Relation Age of Onset   Hypertension Mother    Hypertension Father    Heart disease Father        before age 87   Other Father        varicose veins   COPD Father    Colon cancer Neg Hx    Celiac disease Neg Hx    Inflammatory bowel disease Neg Hx     Social History   Socioeconomic History   Marital status: Married    Spouse name: Not on file   Number of children: 0   Years of education: some coll   Highest education level: Not on file  Occupational History   Occupation: call center    Employer: [employer]    Comment: [employer]  Tobacco Use   Smoking status: Former    Packs/day: 0.50    Years: 40.00    Total pack years: 20.00    Types: Cigarettes    Quit date: 05/2021    Years since quitting: 0.8   Smokeless tobacco: Never  Vaping Use   Vaping Use: Never used  Substance and Sexual Activity   Alcohol use: Yes    Alcohol/week: 2.0 standard drinks of alcohol    Types: 2 Cans of beer per week   Drug use: No   Sexual  activity: Yes    Birth control/protection: None  Other Topics Concern   Not on file  Social History Narrative   Patient is married   for 5 years . Patient works at [employer].. Some college education-did not Writer.Originally from [place].   Social Determinants of Health   Financial Resource Strain: Not on file  Food Insecurity: Not on file  Transportation Needs: Not on file  Physical Activity: Not on file  Stress: Not on file  Social Connections: Not on file     Review of Systems: A 12 point ROS discussed and pertinent positives are indicated in the HPI above.  All other systems are negative.  Review of Systems  Constitutional:  Negative for fatigue and fever.  Respiratory:  Negative for cough and shortness of breath.   Cardiovascular:  Negative for chest pain.  Gastrointestinal:  Positive for abdominal pain, nausea and vomiting.  Musculoskeletal:  Negative for back pain.  Psychiatric/Behavioral:  Negative for behavioral problems and confusion.     Vital Signs: BP (!) 156/101 (BP Location: Right Arm)   Pulse (!) 105   Temp 98.2 F (36.8 C) (Oral)   Resp 18   SpO2 92%   Physical Exam Vitals and nursing note reviewed.  Constitutional:      General: She is not in acute distress.    Appearance: She is well-developed. She is not ill-appearing.  Cardiovascular:     Rate and Rhythm: Normal rate and regular rhythm.  Pulmonary:     Effort: Pulmonary effort is normal.     Breath sounds: Normal breath sounds.  Abdominal:     General: Abdomen is flat. There is no distension.     Tenderness: There is abdominal tenderness (right-sided, generalized).  Skin:    General: Skin is warm and dry.  Neurological:     General: No focal deficit present.     Mental Status: She is alert and oriented to person, place, and time.  Psychiatric:  Mood and Affect: Mood normal.        Behavior: Behavior normal.      MD Evaluation Airway: WNL Heart:  WNL Abdomen: WNL Chest/ Lungs: WNL ASA  Classification: 3 Mallampati/Airway Score: Two   Imaging: CT ABDOMEN PELVIS WO CONTRAST  Result Date: 03/19/2022 CLINICAL DATA:  Acute nonlocalized abdominal pain. Possible perforated appendicitis on previous CT 03/14/2022 EXAM: CT ABDOMEN AND PELVIS WITHOUT CONTRAST TECHNIQUE: Multidetector CT imaging of the abdomen and pelvis was performed following the standard protocol without IV contrast. RADIATION DOSE REDUCTION: This exam was performed according to the departmental dose-optimization program which includes automated exposure control, adjustment of the mA and/or kV according to patient size and/or use of iterative reconstruction technique. COMPARISON:  CT 03/14/2022 FINDINGS: Lower chest: Heart size upper limits of normal. No pericardial effusion. Trace bilateral pleural effusions and associated atelectasis. Sternotomy. Hepatobiliary: Cholecystectomy. Unremarkable noncontrast appearance of the liver. Pancreas: Unremarkable. Spleen: Normal in size without focal abnormality. Adrenals/Urinary Tract: Adrenal glands are unremarkable. Kidneys are normal, without renal calculi, focal lesion, or hydronephrosis. Bladder is unremarkable. Stomach/Bowel: Slightly increased inflammatory changes in the region of the appendix in the right lower quadrant. The appendiceal stump again measures approximately 1.3 cm. No appendicolith identified. Loosely organized edema about the inflammation in the right lower quadrant. No drainable fluid collection or abscess. Normal caliber large and small bowel.  Unremarkable stomach. Vascular/Lymphatic: Aortic atherosclerosis. SMA stent. No enlarged abdominal or pelvic lymph nodes. Reproductive: Fibroid uterus.  No adnexal mass. Other: No free intraperitoneal air. Loosely organized edema within the bilateral subcutaneous lateral abdominal wall fat. Musculoskeletal: No acute osseous abnormality. IMPRESSION: Findings again suggestive of  perforated acute appendicitis with slightly increased adjacent inflammatory change. No drainable fluid collection or abscess. No free intraperitoneal air. New trace bilateral pleural effusions. New nonspecific edema within the subcutaneous fat of the bilateral lateral abdominal walls. Electronically Signed   By: Placido Sou M.D.   On: 03/19/2022 19:59   VAS [place] MESENTERIC  Result Date: 03/15/2022 ABDOMINAL VISCERAL Patient Name:  Michelle Patton  Date of Exam:   03/15/2022 Medical Rec #: [id number]         Accession #:    [id number] Date of Birth: 10-19-1955        Patient Gender: F Patient Age:   34 years Exam Location:  [facility] Procedure:      VAS [place] MESENTERIC Referring Phys: [id number] [place] -------------------------------------------------------------------------------- Indications: History of mesenteric ischemia with SMA stent, new onset abdominal              pain High Risk Factors: Hypertension, hyperlipidemia, past history of smoking. Limitations: Air/bowel gas and obesity. Comparison Study: 01-13-2022 Prior Mesenteric Artery Ultrasound showed mid-SMA                   stent velocities of 320 cm/s Performing Technologist: Darlin Coco RDMS, RVT  Examination Guidelines: A complete evaluation includes B-mode imaging, spectral Doppler, color Doppler, and power Doppler as needed of all accessible portions of each vessel. Bilateral testing is considered an integral part of a complete examination. Limited examinations for reoccurring indications may be performed as noted.  Duplex Findings: +----------------------+--------+--------+------+--------+ Mesenteric            PSV cm/sEDV cm/sPlaqueComments +----------------------+--------+--------+------+--------+ Aorta Mid               116                          +----------------------+--------+--------+------+--------+  Celiac Artery Origin    132                           +----------------------+--------+--------+------+--------+ Celiac Artery Proximal  223                          +----------------------+--------+--------+------+--------+ SMA Origin              216                          +----------------------+--------+--------+------+--------+ SMA Proximal            176                          +----------------------+--------+--------+------+--------+ SMA Mid                 489                          +----------------------+--------+--------+------+--------+ SMA Distal               92                          +----------------------+--------+--------+------+--------+ CHA                      93                          +----------------------+--------+--------+------+--------+ Splenic                  88                          +----------------------+--------+--------+------+--------+ IMA                     129                          +----------------------+--------+--------+------+--------+    Summary: Mesenteric:  Today's examination was limited due to overlying bowel gas; however, celiac, IMA, and SMA all appear patent. Velocities of 489 cm/s at the mid-SMA segment suggest some degree of in-stent stenosis.  *See table(s) above for measurements and observations.  Diagnosing physician: Monica Martinez MD  Electronically signed by Monica Martinez MD on 03/15/2022 at 12:44:04 PM.    Final    CT ABDOMEN PELVIS WO CONTRAST  Result Date: 03/14/2022 CLINICAL DATA:  Abdominal pain, acute, nonlocalized History of mesenteric ischemia per tickly at the SMA but patient has severe contrast allergy, we are doing this as a screening. EXAM: CT ABDOMEN AND PELVIS WITHOUT CONTRAST TECHNIQUE: Multidetector CT imaging of the abdomen and pelvis was performed following the standard protocol without IV contrast. RADIATION DOSE REDUCTION: This exam was performed according to the departmental dose-optimization program which includes  automated exposure control, adjustment of the mA and/or kV according to patient size and/or use of iterative reconstruction technique. COMPARISON:  None Available. FINDINGS: Lower chest: Coronary artery calcification. Hepatobiliary: No focal liver abnormality. Status post cholecystectomy. No biliary dilatation. Pancreas: No focal lesion. Normal pancreatic contour. No surrounding inflammatory changes. No main pancreatic ductal dilatation. Spleen: Normal in size without focal abnormality. Adrenals/Urinary Tract: No adrenal nodule bilaterally. No nephrolithiasis and no hydronephrosis.  No definite contour-deforming renal mass. No ureterolithiasis or hydroureter. The urinary bladder is unremarkable. Stomach/Bowel: Stomach is within normal limits. No evidence of bowel wall thickening or dilatation. Marked inflammatory changes in the region of the appendiceal tip. The appendiceal base is enlarged in caliber up to 1.3 cm with associated periappendiceal fat stranding. No appendicolith identified. No definite organized fluid collection. Vascular/Lymphatic: Limited evaluation of a superior mesenteric origin stent. No abdominal aorta or iliac aneurysm. Mild atherosclerotic plaque of the aorta and its branches. No abdominal, pelvic, or inguinal lymphadenopathy. Reproductive: Lobulated uterine contour with multiple mass lesions, some of which are coarsely calcified. Bilateral adnexal regions are unremarkable. Other: No intraperitoneal free fluid. No intraperitoneal free gas. No organized fluid collection. Musculoskeletal: No abdominal wall hernia or abnormality. No suspicious lytic or blastic osseous lesions. No acute displaced fracture. Multilevel degenerative changes of the spine with intervertebral disc space vacuum phenomenon. IMPRESSION: 1. Findings suggestive of perforated acute appendicitis with markedly limited evaluation on this noncontrast study. Recommend surgical consultation. 2. Degenerative uterine fibroids. 3.  Limited evaluation of a superior mesenteric origin stent on this noncontrast study. 4.  Aortic Atherosclerosis (ICD10-I70.0). Electronically Signed   By: Iven Finn M.D.   On: 03/14/2022 22:03   IR Radiologist Eval & Mgmt  Result Date: 03/02/2022 EXAM: ESTABLISHED PATIENT OFFICE VISIT CHIEF COMPLAINT: See Epic note. HISTORY OF PRESENT ILLNESS: See Epic note. REVIEW OF SYSTEMS: See Epic note. PHYSICAL EXAMINATION: See Epic note. ASSESSMENT AND PLAN: See Epic note. Ruthann Cancer, MD [facility] Radiology Electronically Signed   By: Ruthann Cancer M.D.   On: 03/02/2022 14:19    Labs:  CBC: Recent Labs    03/17/22 0337 03/18/22 0733 03/19/22 0420 03/20/22 0421  WBC 15.9* 14.9* 12.3* 11.5*  HGB 10.7* 10.3* 9.2* 9.8*  HCT 31.3* 30.1* 26.5* 28.4*  PLT 343 360 333 379    COAGS: Recent Labs    07/12/21 1107 03/14/22 2020  INR 1.0 1.1    BMP: Recent Labs    03/17/22 0337 03/18/22 0733 03/19/22 0420 03/20/22 0421  NA 138 135 135 133*  K 4.4 3.4* 3.7 3.1*  CL 101 102 103 101  CO2 22 21* 23 23  GLUCOSE 97 93 85 114*  BUN 5* <5* <5* <5*  CALCIUM 8.3* 8.1* 7.9* 7.9*  CREATININE 0.87 0.82 0.87 0.72  GFRNONAA >60 >60 >60 >60    LIVER FUNCTION TESTS: Recent Labs    10/11/21 1701 02/20/22 1626 03/14/22 2020 03/15/22 0605  BILITOT 0.2* 0.3 0.6 0.4  AST 20 16 14* 14*  ALT '16 13 15 14  '$ ALKPHOS 43 70 78 68  PROT 5.6* 7.0 7.8 6.5  ALBUMIN 3.0* 4.1 3.4* 2.8*    TUMOR MARKERS: No results for input(s): "AFPTM", "CEA", "CA199", "CHROMGRNA" in the last 8760 hours.  Assessment and Plan: Mesenteric ischemia Patient with chronic mesenteric ischemia, now admitted with perforated appendicitis.  Surgery is following for ongoing potential need for lap appy vs. R hemicolectomy.  She continues with conservative management for now.   Dr. Serafina Royals has discussed case with Dr. Rosendo Gros and all onboard for proceeding with mesenteric angiogram  with intervention.  This was thoroughly discussed with patient at bedside this afternoon.  She is agreeable to proceed.  She does have a contrast allergy and will need contrast premedication. This has been ordered and timed for 1:00 tomorrow.  NPO p MN.  She is not currently on blood thinners.   Risks and benefits of angiogram with  intervention were discussed with the patient including, but not limited to bleeding, infection, vascular injury, contrast induced renal failure, stroke or even death.  This interventional procedure involves the use of X-rays and because of the nature of the planned procedure, it is possible that we will have prolonged use of X-ray fluoroscopy.  Potential radiation risks to you include (but are not limited to) the following: - A slightly elevated risk for cancer  several years later in life. This risk is typically less than 0.5% percent. This risk is low in comparison to the normal incidence of human cancer, which is 33% for women and 50% for men according to the [place]. - Radiation induced injury can include skin redness, resembling a rash, tissue breakdown / ulcers and hair loss (which can be temporary or permanent).   The likelihood of either of these occurring depends on the difficulty of the procedure and whether you are sensitive to radiation due to previous procedures, disease, or genetic conditions.   IF your procedure requires a prolonged use of radiation, you will be notified and given written instructions for further action.  It is your responsibility to monitor the irradiated area for the 2 weeks following the procedure and to notify your physician if you are concerned that you have suffered a radiation induced injury.    All of the patient's questions were answered, patient is agreeable to proceed.  Consent signed and in chart.  Thank you for this interesting consult.  I greatly enjoyed meeting Michelle Patton and look forward to  participating in their care.  A copy of this report was sent to the requesting provider on this date.  Electronically Signed: Docia Barrier, PA 03/20/2022, 2:45 PM   I spent a total of 40 Minutes    in face to face in clinical consultation, greater than 50% of which was counseling/coordinating care for mesenteric ischemia.

## 2022-03-20 NOTE — Care Management Important Message (Signed)
Important Message  Patient Details  Name: Michelle Patton MRN: [id number] Date of Birth: 03/06/1956   Medicare Important Message Given:  Yes     Hannah Beat 03/20/2022, 1:07 PM

## 2022-03-21 ENCOUNTER — Inpatient Hospital Stay (HOSPITAL_COMMUNITY): Payer: Medicare Other

## 2022-03-21 HISTORY — PX: IR US GUIDE VASC ACCESS RIGHT: IMG2390

## 2022-03-21 HISTORY — PX: IR PTA NON CORO-LOWER EXTREM: IMG6142

## 2022-03-21 HISTORY — PX: IR ANGIOGRAM VISCERAL SELECTIVE: IMG657

## 2022-03-21 HISTORY — PX: IR THROMBECT PRIM MECH INIT (INCLU) MOD SED: IMG2297

## 2022-03-21 HISTORY — PX: IR IVUS EACH ADDITIONAL NON CORONARY VESSEL: IMG6086

## 2022-03-21 HISTORY — PX: IR AORTAGRAM ABDOMINAL SERIALOGRAM: IMG636

## 2022-03-21 LAB — CBC
HCT: 30.2 % — ABNORMAL LOW (ref 36.0–46.0)
Hemoglobin: 10 g/dL — ABNORMAL LOW (ref 12.0–15.0)
MCH: 28.3 pg (ref 26.0–34.0)
MCHC: 33.1 g/dL (ref 30.0–36.0)
MCV: 85.6 fL (ref 80.0–100.0)
Platelets: 471 10*3/uL — ABNORMAL HIGH (ref 150–400)
RBC: 3.53 MIL/uL — ABNORMAL LOW (ref 3.87–5.11)
RDW: 12.1 % (ref 11.5–15.5)
WBC: 10.6 10*3/uL — ABNORMAL HIGH (ref 4.0–10.5)
nRBC: 0 % (ref 0.0–0.2)

## 2022-03-21 LAB — BASIC METABOLIC PANEL
Anion gap: 11 (ref 5–15)
BUN: <5 mg/dL — ABNORMAL LOW (ref 8–23)
CO2: 24 mmol/L (ref 22–32)
Calcium: 8.4 mg/dL — ABNORMAL LOW (ref 8.9–10.3)
Chloride: 101 mmol/L (ref 98–111)
Creatinine, Ser: 0.7 mg/dL (ref 0.44–1.00)
GFR, Estimated: >60 mL/min (ref >60)
Glucose, Bld: 158 mg/dL — ABNORMAL HIGH (ref 70–99)
Potassium: 3.5 mmol/L (ref 3.5–5.1)
Sodium: 136 mmol/L (ref 135–145)

## 2022-03-21 LAB — POCT ACTIVATED CLOTTING TIME
Activated Clotting Time: 143 seconds
Activated Clotting Time: 227 seconds

## 2022-03-21 MED ORDER — LIDOCAINE HCL 1 % IJ SOLN
INTRAMUSCULAR | Status: AC
  Administered 2022-03-21: 9 mL
  Filled 2022-03-21: qty 20

## 2022-03-21 MED ORDER — FENTANYL CITRATE (PF) 100 MCG/2ML IJ SOLN
INTRAMUSCULAR | Status: AC
  Filled 2022-03-21: qty 2

## 2022-03-21 MED ORDER — DIPHENHYDRAMINE HCL 50 MG/ML IJ SOLN
INTRAMUSCULAR | Status: AC | PRN
  Administered 2022-03-21: 50 mg via INTRAVENOUS

## 2022-03-21 MED ORDER — ASPIRIN 81 MG PO CHEW
81.0000 mg | CHEWABLE_TABLET | Freq: Once | ORAL | Status: AC
  Administered 2022-03-21: 81 mg via ORAL
  Filled 2022-03-21: qty 1

## 2022-03-21 MED ORDER — HYDROMORPHONE HCL 1 MG/ML IJ SOLN
INTRAMUSCULAR | Status: AC | PRN
  Administered 2022-03-21: 1 mg via INTRAVENOUS

## 2022-03-21 MED ORDER — HYDROMORPHONE HCL 1 MG/ML IJ SOLN
INTRAMUSCULAR | Status: AC
  Filled 2022-03-21: qty 1

## 2022-03-21 MED ORDER — MIDAZOLAM HCL 2 MG/2ML IJ SOLN
INTRAMUSCULAR | Status: AC | PRN
  Administered 2022-03-21 (×3): .5 mg via INTRAVENOUS
  Administered 2022-03-21: 1 mg via INTRAVENOUS
  Administered 2022-03-21: .5 mg via INTRAVENOUS

## 2022-03-21 MED ORDER — HYDROXYZINE HCL 10 MG PO TABS: 10.0000 mg | ORAL_TABLET | Freq: Three times a day (TID) | ORAL | Status: DC | PRN

## 2022-03-21 MED ORDER — DIPHENHYDRAMINE HCL 50 MG/ML IJ SOLN
INTRAMUSCULAR | Status: AC
  Filled 2022-03-21: qty 1

## 2022-03-21 MED ORDER — CLOPIDOGREL BISULFATE 75 MG PO TABS
75.0000 mg | ORAL_TABLET | Freq: Every day | ORAL | Status: DC
  Administered 2022-03-21 – 2022-03-22 (×2): 75 mg via ORAL
  Filled 2022-03-21 (×2): qty 1

## 2022-03-21 MED ORDER — MIDAZOLAM HCL 2 MG/2ML IJ SOLN
INTRAMUSCULAR | Status: AC
  Filled 2022-03-21: qty 2

## 2022-03-21 MED ORDER — FENTANYL CITRATE (PF) 100 MCG/2ML IJ SOLN
INTRAMUSCULAR | Status: AC | PRN
  Administered 2022-03-21 (×2): 25 ug via INTRAVENOUS

## 2022-03-21 MED ORDER — DOCUSATE SODIUM 100 MG PO CAPS
100.0000 mg | ORAL_CAPSULE | Freq: Two times a day (BID) | ORAL | Status: DC
  Administered 2022-03-21 – 2022-03-22 (×3): 100 mg via ORAL
  Filled 2022-03-21 (×3): qty 1

## 2022-03-21 MED ORDER — METHOCARBAMOL 500 MG PO TABS
500.0000 mg | ORAL_TABLET | Freq: Four times a day (QID) | ORAL | Status: DC
  Administered 2022-03-21 – 2022-03-22 (×4): 500 mg via ORAL
  Filled 2022-03-21 (×4): qty 1

## 2022-03-21 MED ORDER — HEPARIN SODIUM (PORCINE) 1000 UNIT/ML IJ SOLN
INTRAMUSCULAR | Status: AC | PRN
  Administered 2022-03-21: 10000 [IU] via INTRAVENOUS
  Administered 2022-03-21: 3000 [IU] via INTRAVENOUS

## 2022-03-21 MED ORDER — HEPARIN SODIUM (PORCINE) 1000 UNIT/ML IJ SOLN
INTRAMUSCULAR | Status: AC
  Filled 2022-03-21: qty 10

## 2022-03-21 MED ORDER — IOHEXOL 300 MG/ML  SOLN: 50.0000 mL | Freq: Once | INTRAMUSCULAR | Status: DC | PRN

## 2022-03-21 MED ORDER — DIPHENHYDRAMINE HCL 25 MG PO CAPS
50.0000 mg | ORAL_CAPSULE | Freq: Once | ORAL | Status: AC
  Administered 2022-03-21: 50 mg via ORAL
  Filled 2022-03-21: qty 2

## 2022-03-21 MED ORDER — SENNA 8.6 MG PO TABS: 1.0000 | ORAL_TABLET | Freq: Every evening | ORAL | Status: DC | PRN

## 2022-03-21 MED ORDER — ASPIRIN 81 MG PO CHEW
81.0000 mg | CHEWABLE_TABLET | Freq: Every day | ORAL | Status: DC
  Administered 2022-03-22: 81 mg via ORAL
  Filled 2022-03-21: qty 1

## 2022-03-21 MED ORDER — LORAZEPAM 2 MG/ML IJ SOLN: 0.2500 mg | Freq: Every evening | INTRAMUSCULAR | Status: DC | PRN

## 2022-03-21 NOTE — Sedation Documentation (Signed)
Patient transported to [place] RN at the bedside to receive patient. Right groin assessed. Clean, dry and intact. No drainage noted from dressing. Soft to touch, no hematoma noted. +2 distal pulses intact. Patient is alert and oriented x4.

## 2022-03-21 NOTE — Sedation Documentation (Signed)
8 fr Angioseal deployed right groin

## 2022-03-21 NOTE — Progress Notes (Signed)
Subjective/Chief Complaint: Pain is stable and not worsened with full liquids. Passing flatus. No BM this admission. NPO currently for IR. Notes very vivid dreams with ativan. Has some bilateral nonpainful LE swelling   Objective: Vital signs in last 24 hours: Temp:  [97.6 F (36.4 C)-98.7 F (37.1 C)] 97.7 F (36.5 C) (11/28 0312) Pulse Rate:  [77-105] 86 (11/28 0552) Resp:  [18-20] 18 (11/28 0312) BP: (137-170)/(67-101) 161/89 (11/28 0552) SpO2:  [90 %-96 %] 92 % (11/28 0552) Last BM Date :  (PTA)  Intake/Output from previous day: 11/27 0701 - 11/28 0700 In: 1301.6 [I.V.:1089.5; IV Piggyback:212] Out: -  Intake/Output this shift: No intake/output data recorded.  PE: General: pleasant, WD, female who is laying in bed in NADy Lungs: Respiratory effort nonlabored Abd: soft, ND, mild RLQ TTP without rebound or guarding  MSK: mild edema to level of ankles bilaterally Skin: warm and dry  Neuro: Cranial nerves 2-12 grossly intact, sensation is normal throughout Psych: A&Ox3 with an appropriate affect.    Lab Results:  Recent Labs    03/20/22 0421 03/21/22 0452  WBC 11.5* 10.6*  HGB 9.8* 10.0*  HCT 28.4* 30.2*  PLT 379 471*    BMET Recent Labs    03/20/22 0421 03/21/22 0651  NA 133* 136  K 3.1* 3.5  CL 101 101  CO2 23 24  GLUCOSE 114* 158*  BUN <5* <5*  CREATININE 0.72 0.70  CALCIUM 7.9* 8.4*    PT/INR No results for input(s): "LABPROT", "INR" in the last 72 hours. ABG No results for input(s): "PHART", "HCO3" in the last 72 hours.  Invalid input(s): "PCO2", "PO2"  Studies/Results: CT ABDOMEN PELVIS WO CONTRAST  Result Date: 03/19/2022 CLINICAL DATA:  Acute nonlocalized abdominal pain. Possible perforated appendicitis on previous CT 03/14/2022 EXAM: CT ABDOMEN AND PELVIS WITHOUT CONTRAST TECHNIQUE: Multidetector CT imaging of the abdomen and pelvis was performed following the standard protocol without IV contrast. RADIATION DOSE REDUCTION: This exam  was performed according to the departmental dose-optimization program which includes automated exposure control, adjustment of the mA and/or kV according to patient size and/or use of iterative reconstruction technique. COMPARISON:  CT 03/14/2022 FINDINGS: Lower chest: Heart size upper limits of normal. No pericardial effusion. Trace bilateral pleural effusions and associated atelectasis. Sternotomy. Hepatobiliary: Cholecystectomy. Unremarkable noncontrast appearance of the liver. Pancreas: Unremarkable. Spleen: Normal in size without focal abnormality. Adrenals/Urinary Tract: Adrenal glands are unremarkable. Kidneys are normal, without renal calculi, focal lesion, or hydronephrosis. Bladder is unremarkable. Stomach/Bowel: Slightly increased inflammatory changes in the region of the appendix in the right lower quadrant. The appendiceal stump again measures approximately 1.3 cm. No appendicolith identified. Loosely organized edema about the inflammation in the right lower quadrant. No drainable fluid collection or abscess. Normal caliber large and small bowel.  Unremarkable stomach. Vascular/Lymphatic: Aortic atherosclerosis. SMA stent. No enlarged abdominal or pelvic lymph nodes. Reproductive: Fibroid uterus.  No adnexal mass. Other: No free intraperitoneal air. Loosely organized edema within the bilateral subcutaneous lateral abdominal wall fat. Musculoskeletal: No acute osseous abnormality. IMPRESSION: Findings again suggestive of perforated acute appendicitis with slightly increased adjacent inflammatory change. No drainable fluid collection or abscess. No free intraperitoneal air. New trace bilateral pleural effusions. New nonspecific edema within the subcutaneous fat of the bilateral lateral abdominal walls. Electronically Signed   By: Placido Sou M.D.   On: 03/19/2022 19:59    Anti-infectives: Anti-infectives (From admission, onward)    Start     Dose/Rate Route Frequency Ordered Stop   03/15/22 0600  piperacillin-tazobactam (ZOSYN) IVPB 3.375 g        3.375 g 12.5 mL/hr over 240 Minutes Intravenous Every 8 hours 03/15/22 0451     03/14/22 2245  piperacillin-tazobactam (ZOSYN) IVPB 3.375 g        3.375 g 100 mL/hr over 30 Minutes Intravenous  Once 03/14/22 2234 03/15/22 0019       Assessment/Plan: Likely perforated appendicitis -repeat CT scan 11/26 without drainable abscess. Continued inflammatory changes - adjusted pain regimen today - dc ativan due to vivid dreams. Prn atarax for anxiety/sleep - tolerated fld. Resume after IR procedure. Start schd colace - seems to be improving and plan for interval appendectomy in 6-8 weeks if she continues to improve   FEN: npo for IR. Can resume FLD post procedure, SLIV ID: [email or web address] VTE: [email or web address]  Winferd Humphrey, [facility] Surgery 03/21/2022, 8:36 AM Please see Amion for pager number during day hours 7:00am-4:30pm

## 2022-03-21 NOTE — Progress Notes (Addendum)
Patient going off unit for procedure and then will be admitted to [facility] [address].   Patient requested her husband take possession of her belongings instead of RN transferring belongings to [place].   Patient's belongings held at [employer] PCU secretary desk to be given to patient's husband Raschie Newland when he arrives to unit.    1645 - patient's husband arrived to unit and patient's belongings given to patient's husband per patient's request.

## 2022-03-21 NOTE — Procedures (Addendum)
Interventional Radiology Procedure Note  Procedure:  1) CO2 abdominal aortogram 2) CO2 superior mesenteric angiogram 3) Intravascular ultrasound 4) Aspiration thrombectomy of superior mesenteric artery 5) Drug coated balloon angioplasty of superior mesenteric artery  Findings: Please refer to procedural dictation for full description. Mostly thrombosed SMA stent on initial IVUS.  Repeat IVUS showed fragmentation and distal propagation of thrombus.  Successful aspiration thrombectomy of main SMA with Penumbra 7 Fr.  DCB treatment to residual distal edge stenosis of indwelling stent with 5 mm In.Pact. 8 Fr right CFA Angioseal.  Complications: None immediate  Estimated Blood Loss: 50 mL  Recommendations: Strict 4 hour bedrest (2 hours flat until 21:15, 2 hours head of bed up to 30 degrees until 23:15) If severe abdominal pain develops, recommend obtaining lactate levels, consider emergent repeat pre-medication and obtain CTA AP.   Resume ASA, Plavix. IR will follow.   Ruthann Cancer, MD Pager: [phone]

## 2022-03-22 ENCOUNTER — Other Ambulatory Visit: Payer: Self-pay | Admitting: Physician Assistant

## 2022-03-22 ENCOUNTER — Other Ambulatory Visit: Payer: Self-pay | Admitting: Interventional Radiology

## 2022-03-22 DIAGNOSIS — K559 Vascular disorder of intestine, unspecified: Secondary | ICD-10-CM

## 2022-03-22 LAB — POCT ACTIVATED CLOTTING TIME: Activated Clotting Time: 239 seconds

## 2022-03-22 MED ORDER — AMOXICILLIN-POT CLAVULANATE 875-125 MG PO TABS: 1.0000 | ORAL_TABLET | Freq: Two times a day (BID) | ORAL | 0 refills | Status: AC

## 2022-03-22 MED ORDER — OXYCODONE HCL 5 MG PO TABS: 5.0000 mg | ORAL_TABLET | ORAL | 0 refills | Status: DC | PRN

## 2022-03-22 MED FILL — Hydromorphone HCl Inj 2 MG/ML: INTRAMUSCULAR | Qty: 1 | Status: AC

## 2022-03-22 NOTE — Discharge Summary (Signed)
Patient ID: Michelle Patton [id number] [birth year] 66 y.o.  Admit date: 03/14/2022 Discharge date: 03/22/2022  Admitting Diagnosis: Acute perforated appendicitis Mesenteric ischemia  HLD HTN hypothyroidism  Discharge Diagnosis Patient Active Problem List   Diagnosis Date Noted   Leukocytosis 03/15/2022   Mixed hyperlipidemia 03/15/2022   Acquired hypothyroidism 03/15/2022   Hypoalbuminemia due to protein-calorie malnutrition ([place]) 03/15/2022   Obesity (BMI 30-39.9) 03/15/2022   Acute perforated appendicitis 03/14/2022   Allergy, drug 01/13/2022   Melena 12/06/2021   Prolapsed internal hemorrhoids, grade 3 12/06/2021   Left lower quadrant abdominal pain 12/06/2021   Acute anaphylaxis 10/11/2021   Mesenteric ischemia ([place]) 07/12/2021   Chronic mesenteric ischemia ([place]) 02/22/2021   Elevated LFTs 10/05/2020   Class 2 severe obesity due to excess calories with serious comorbidity and body mass index (BMI) of 36.0 to 36.9 in adult [facility]) 08/09/2020   PAD (peripheral artery disease) ([place]) 08/09/2020   Aortic valve prosthesis present 70/35/0093   Chronic systolic congestive heart failure ([place]) 08/09/2020   OSA and COPD overlap syndrome ([place]) 08/09/2020   s/p right carpal tunnel release 03/02/20 04/06/2020   Closed displaced fracture of proximal phalanx of lesser toe of right foot 12/31/2019   Carpal tunnel syndrome of right wrist 12/05/2019   Nondisplaced fracture of fifth right metatarsal bone with routine healing 10/21/2019   Skin lesion 04/11/2019   Low back pain without sciatica 01/20/2019   Pain of upper abdomen 01/14/2019   Fatigue 12/09/2018   Breast cancer screening 12/09/2018   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 left wrist 10/23/2018   S/P right knee arthroscopy 01/10/18 01/18/2018   Chondromalacia, patella, right    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    Personal history of colonic polyps 12/28/2016   Abdominal pain,  epigastric 08/10/2016   Left sided abdominal pain 08/10/2016   Esophageal dysphagia 08/10/2016   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   Supravalvular aortic stenosis, congenital - s/p repair during childhood    Essential hypertension    GERD (gastroesophageal reflux disease)    Aortic stenosis, severe 07/20/2014   Aortic regurgitation 07/20/2014   Leg cramps 09/26/2012   Occlusion and stenosis of carotid artery without mention of cerebral infarction 07/10/2012   Peripheral vascular disease, unspecified ([place]) 06/12/2012   Carotid artery bruit 06/12/2012   CONSTIPATION 12/28/2009   NAUSEA AND VOMITING 12/28/2009   DIARRHEA 12/28/2009  Acute perforated appendicitis  Consultants General surgery medicine IR  Reason for Admission: Michelle Patton is a 66 y.o. female with medical history significant of mesenteric ischemia (follows with Dr. Serafina Royals), hyperlipidemia, hypertension, hypothyroidism who presents to the emergency department due to 3-week onset of right lower quadrant pain with pain extending to other parts of the abdomen.  Pain was constant, sharp in nature and rated as 10/10 on pain scale.  Pain worsens with any movement that involves raising her right leg, otherwise no known alleviating factors.  She followed with gastroenterology (Dr. Roseanne Kaufman) who sent her  to the ED for further evaluation and management.  She endorsed worsening abdominal pain with food including yogurt, so she has been avoiding eating due to pain.  She denies nausea or vomiting and also denies blood in stool, last BM was yesterday.   ED Course:  In the emergency department, she was intermittently tachycardic, BP was 150/84, other vital signs are within normal range.  Workup in the ED showed normal CBC except for WBC of  15.9, normal BMP, albumin 3.4, lipase 30, lactic acid x 2 was normal. CT abdomen and pelvis without contrast showed findings suggestive of perforated  acute appendicitis with markedly limited evaluation. She was treated with Dilaudid, Zofran was given, IV Zosyn was provided and IV hydration was given. General surgery (Dr. Constance Haw) was called and advised the ED physician to talk to general surgery at [facility],.Dr. Thermon Leyland was consulted and agrees for patient to be admitted to [facility] with plan to consult on patient on arrival to [facility] and Dr. Serafina Royals will see patient in consultation tomorrow per ED physician. Hospitalist was asked to admit patient for further evaluation and management.  Procedures Dr. Serafina Royals, IR 1) CO2 abdominal aortogram 2) CO2 superior mesenteric angiogram 3) Intravascular ultrasound 4) Aspiration thrombectomy of superior mesenteric artery 5) Drug coated balloon angioplasty of superior mesenteric artery  Hospital Course:  The patient was admitted and started on IV abx therapy and her plavix was held as well.  Initially there was a plan for a lap appy during this admission, but she overall improved well with conservative management that this was continued with plans for an interval lap appy.  Her diet was able to be advanced as tolerated.  She was seen by IR while here and underwent the above procedure as this was scheduled as an outpatient already.  She tolerated this well.  On HD 7, the patient was tolerating a diet and medically stable for DC home with appropriate follow ups made.  Physical Exam: Gen: NAD, alert and appropriate Abd: soft, NT, ND, +BS  Allergies as of 03/22/2022       Reactions   Iodinated Contrast Media Anaphylaxis, Shortness Of Breath, Itching, Swelling, Other (See Comments)   Patient was given Omni 350 and suffered from itching, chest pain and shortness of breath. Patient was taken to ED after onset of reaction. 10/11/2021  MD zackowski noted pt had tongue and throat swelling, had to give pt epinephrine    Sulfa Antibiotics Nausea And Vomiting        Medication List     TAKE these  medications    acetaminophen 325 MG tablet Commonly known as: TYLENOL Take 650 mg by mouth every 6 (six) hours as needed for mild pain, moderate pain, fever or headache.   amoxicillin-clavulanate 875-125 MG tablet Commonly known as: AUGMENTIN Take 1 tablet by mouth 2 (two) times daily for 7 days.   aspirin EC 81 MG tablet Take 1 tablet (81 mg total) by mouth daily. Swallow whole. What changed:  when to take this additional instructions   Calcium-Magnesium-Zinc Tabs Take 1 tablet by mouth in the morning.   clopidogrel 75 MG tablet Commonly known as: Plavix Take 1 tablet (75 mg total) by mouth daily. What changed: when to take this   diclofenac 75 MG EC tablet Commonly known as: VOLTAREN TAKE 1 TABLET TWICE A DAY   dicyclomine 10 MG capsule Commonly known as: BENTYL Take 1 capsule (10 mg total) by mouth every 8 (eight) hours as needed for spasms. For left-sided discomfort and frequent stools. Watch for constipation, dry mouth, dizziness.   estradiol 2 MG tablet Commonly known as: ESTRACE Take 1 tablet (2 mg total) by mouth daily. What changed: when to take this   ezetimibe 10 MG tablet Commonly known as: ZETIA Take 1 tablet (10 mg total) by mouth daily. What changed: when to take this   FLAX SEED OIL PO Take 1 capsule by mouth in the morning.   hydrocortisone  2.5 % rectal cream Commonly known as: ANUSOL-HC Place 1 Application rectally 2 (two) times daily. As needed for rectal bleeding. What changed:  when to take this reasons to take this additional instructions   losartan 25 MG tablet Commonly known as: COZAAR TAKE 1 TABLET BY MOUTH DAILY What changed: when to take this   metoprolol tartrate 25 MG tablet Commonly known as: LOPRESSOR TAKE 1 TABLET TWICE A DAY   multivitamin with minerals Tabs tablet Take 1 tablet by mouth in the morning.   NP Thyroid 120 MG tablet Generic drug: thyroid Take 120 mg by mouth daily before breakfast.   oxyCODONE 5 MG  immediate release tablet Commonly known as: Oxy IR/ROXICODONE Take 1 tablet (5 mg total) by mouth every 4 (four) hours as needed for moderate pain.   progesterone 200 MG capsule Commonly known as: PROMETRIUM Take 2 capsules (400 mg total) by mouth daily. What changed: when to take this   RABEprazole 20 MG tablet Commonly known as: ACIPHEX Take 1 tablet (20 mg total) by mouth 2 (two) times daily before a meal.   rosuvastatin 40 MG tablet Commonly known as: CRESTOR TAKE 1 TABLET DAILY What changed: when to take this   triamcinolone ointment 0.1 % Commonly known as: KENALOG Apply 1 Application topically 2 (two) times daily as needed. What changed: reasons to take this   VITAMIN D-3 PO Take 1 capsule by mouth in the morning.          Follow-up Information     Ralene Ok, MD Follow up on 04/26/2022.   Specialty: General Surgery Why: 9:40 AM, Arrive 30 minutes prior to your appointment time, Please bring your insurance card and photo ID Contact information: [place] [id number] [phone]         Suzette Battiest, MD Follow up today.   Specialties: Interventional Radiology, Diagnostic Radiology, Radiology Why: As needed per his recommendations Contact information: [place] [address] [phone]                 Signed: Saverio Danker, [facility] Surgery 03/22/2022, 11:40 AM Please see Amion for pager number during day hours 7:00am-4:30pm, 7-11:30am on Weekends

## 2022-03-22 NOTE — Progress Notes (Signed)
Mobility Specialist Progress Note:   03/22/22 0947  Mobility  Activity Ambulated with assistance in hallway  Level of Assistance Modified independent, requires aide device or extra time  Assistive Device None  Distance Ambulated (ft) 500 ft  Activity Response Tolerated well  $Mobility charge 1 Mobility   Pt in bed willing to participate in mobility. No complaints of pain. Left in bed with call bell in reach and all needs met.   Gareth Eagle Harmoni Lucus Mobility Specialist Please contact via [employer] or  Rehab Office at [phone]

## 2022-03-27 ENCOUNTER — Telehealth: Payer: Self-pay | Admitting: Family Medicine

## 2022-03-27 ENCOUNTER — Ambulatory Visit: Payer: Medicare Other | Admitting: Family Medicine

## 2022-03-27 ENCOUNTER — Encounter: Payer: Self-pay | Admitting: Family Medicine

## 2022-03-27 VITALS — BP 138/78 | HR 69 | Temp 97.8°F | Ht 64.0 in | Wt 218.4 lb

## 2022-03-27 DIAGNOSIS — K3532 Acute appendicitis with perforation and localized peritonitis, without abscess: Secondary | ICD-10-CM

## 2022-03-27 DIAGNOSIS — Z8639 Personal history of other endocrine, nutritional and metabolic disease: Secondary | ICD-10-CM | POA: Diagnosis not present

## 2022-03-27 DIAGNOSIS — K551 Chronic vascular disorders of intestine: Secondary | ICD-10-CM

## 2022-03-27 LAB — BASIC METABOLIC PANEL
BUN: 10 mg/dL (ref 6–23)
CO2: 29 mEq/L (ref 19–32)
Calcium: 8.9 mg/dL (ref 8.4–10.5)
Chloride: 100 mEq/L (ref 96–112)
Creatinine, Ser: 0.91 mg/dL (ref 0.40–1.20)
GFR: 66.39 mL/min (ref >60.00)
Glucose, Bld: 94 mg/dL (ref 70–99)
Potassium: 3.6 mEq/L (ref 3.5–5.1)
Sodium: 138 mEq/L (ref 135–145)

## 2022-03-27 LAB — CBC
HCT: 33.4 % — ABNORMAL LOW (ref 36.0–46.0)
Hemoglobin: 11.2 g/dL — ABNORMAL LOW (ref 12.0–15.0)
MCHC: 33.7 g/dL (ref 30.0–36.0)
MCV: 85.7 fl (ref 78.0–100.0)
Platelets: 622 10*3/uL — ABNORMAL HIGH (ref 150.0–400.0)
RBC: 3.9 Mil/uL (ref 3.87–5.11)
RDW: 12.8 % (ref 11.5–15.5)
WBC: 12.2 10*3/uL — ABNORMAL HIGH (ref 4.0–10.5)

## 2022-03-27 NOTE — Progress Notes (Unsigned)
Subjective:  Patient ID: Michelle Patton, female    DOB: Aug 18, 1955  Age: 66 y.o. MRN: [id number]  CC:  Chief Complaint  Patient presents with   Hospitalization Follow-up    Pt was in for ruptured appendix, and had to had angioplasty completed     HPI Michelle Patton presents for  Hospital follow-up, perforated appendicitis, chronic mesenteric ischemia. Admitted 03/14/2022 through 03/22/2022.  Appendicitis with rupture Initially seen in ER after reported 3-week onset of right lower quadrant pain, CT abdomen pelvis indicated perforated acute appendicitis, surgery consulted.  Admitted, started on IV antibiotic therapy, Plavix held.  Plan for possible laparoscopic appendectomy but overall improved with conservative management, plans for interval lap appendectomy.  Seen by interventional radiology November 28 for aortogram, mesenteric angiogram with history of mesenteric ischemia. Thrombosed SMA stent on initial IVUS, repeat IVUS showed fragmentation and distal propagation of thrombus.  Successful aspiration thrombectomy of the main superior mesenteric artery and drug-coated balloon angioplasty of the superior mesenteric artery.  Resumed on aspirin, Plavix.   Repeat CT scan November 26 without drainable abscess.  Continued inflammatory changes.  Stable by day 7, discharged home with planned follow-up at surgery on January 3.  Interval appendectomy in 6 to 8 weeks as long as she was continuing to improve.  Still taking antibiotics - Augmentin BID.  No known recent fevers. Drinking fluids, some decreased appetite. 1 meal per day. BM's QOD. No blood.  No vomiting.  Taking oxycodone one at night with tylenol - pain is worse at night. Pain about the same. Unknown about follow up appt with general surgery.  BMP noted from 11/28. Prior hypokalemia (3.1) resolved to 3.5.  Last day at work 11/21 when went to hospital, still out of work. Planned RTW in 1 week. Has discussed RTW with surgeon - unclear  in regard timing.  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 Plans to RTW as planned next Monday.  Would like to see care with different gastroenterology office. Unhappy with current provider. She will message me with new provider.   Lab Results  Component Value Date   WBC 10.6 (H) 03/21/2022   HGB 10.0 (L) 03/21/2022   HCT 30.2 (L) 03/21/2022   MCV 85.6 03/21/2022   PLT 471 (H) 03/21/2022   Lab Results  Component Value Date   NA 136 03/21/2022   K 3.5 03/21/2022   CL 101 03/21/2022   CO2 24 03/21/2022       History Patient Active Problem List   Diagnosis Date Noted   Leukocytosis 03/15/2022   Mixed hyperlipidemia 03/15/2022   Acquired hypothyroidism 03/15/2022   Hypoalbuminemia due to protein-calorie malnutrition ([place]) 03/15/2022   Obesity (BMI 30-39.9) 03/15/2022   Acute perforated appendicitis 03/14/2022   Allergy, drug 01/13/2022   Melena 12/06/2021   Prolapsed internal hemorrhoids, grade 3 12/06/2021   Left lower quadrant abdominal pain 12/06/2021   Acute anaphylaxis 10/11/2021   Mesenteric ischemia ([place]) 07/12/2021   Chronic mesenteric ischemia ([place]) 02/22/2021   Elevated LFTs 10/05/2020   Class 2 severe obesity due to excess calories with serious comorbidity and body mass index (BMI) of 36.0 to 36.9 in adult [facility]) 08/09/2020   PAD (peripheral artery disease) ([place]) 08/09/2020   Aortic valve prosthesis present 83/41/9622   Chronic systolic congestive heart failure ([place]) 08/09/2020   OSA and COPD overlap syndrome ([place]) 08/09/2020   s/p right carpal tunnel release 03/02/20 04/06/2020   Closed displaced fracture of proximal phalanx of lesser toe of  right foot 12/31/2019   Carpal tunnel syndrome of right wrist 12/05/2019   Nondisplaced fracture of fifth right metatarsal bone with routine healing 10/21/2019   Skin lesion 04/11/2019   Low back pain without sciatica 01/20/2019   Pain of upper abdomen 01/14/2019   Fatigue 12/09/2018   Breast  cancer screening 12/09/2018   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 left wrist 10/23/2018   S/P right knee arthroscopy 01/10/18 01/18/2018   Chondromalacia, patella, right    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    Personal history of colonic polyps 12/28/2016   Abdominal pain, epigastric 08/10/2016   Left sided abdominal pain 08/10/2016   Esophageal dysphagia 08/10/2016   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   Supravalvular aortic stenosis, congenital - s/p repair during childhood    Essential hypertension    GERD (gastroesophageal reflux disease)    Aortic stenosis, severe 07/20/2014   Aortic regurgitation 07/20/2014   Leg cramps 09/26/2012   Occlusion and stenosis of carotid artery without mention of cerebral infarction 07/10/2012   Peripheral vascular disease, unspecified ([place]) 06/12/2012   Carotid artery bruit 06/12/2012   Past Medical History:  Diagnosis Date   Aortic regurgitation 07/20/2014   Aortic stenosis, severe 07/20/2014   Arthritis    DVT (deep venous thrombosis) (H[facility])    Family history of adverse reaction to anesthesia    Reports father deliurm in his 86's with CABG   GERD (gastroesophageal reflux disease)    History of pneumonia    Hyperlipidemia    Hypertension    Insomnia, unspecified    Muscle weakness (generalized)    Peripheral artery disease (H[facility])    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   Redo sternotomy for 21 mm Medtronic Freestyle porcine aortic root graft w/ reimplantation of left main and right coronary arteries   Sleep apnea    diagnosed multiple years ago at [facility] aortic stenosis, congenital - s/p repair during childhood    Past Surgical History:  Procedure Laterality Date   ABDOMINAL AORTAGRAM  06/24/2012   ABDOMINAL AORTAGRAM N/A 06/24/2012   Procedure: ABDOMINAL Maxcine Ham;  Surgeon:  Angelia Mould, MD;  Location: [facility] CATH LAB;  Service: Cardiovascular;  Laterality: N/A;   AORTIC VALVE REPLACEMENT N/A 10/07/2014   Procedure: REDO AORTIC VALVE REPLACEMENT (AVR);  Surgeon: Rexene Alberts, MD;  Location: [place];  Service: Open Heart Surgery;  Laterality: N/A;   ASCENDING AORTIC ROOT REPLACEMENT N/A 10/07/2014   Procedure: ASCENDING AORTIC ROOT REPLACEMENT;  Surgeon: Rexene Alberts, MD;  Location: [place];  Service: Open Heart Surgery;  Laterality: N/A;   BIOPSY  09/27/2016   Procedure: BIOPSY;  Surgeon: Daneil Dolin, MD;  [facility];  Service: Endoscopy;;  colon   BIOPSY  10/18/2020   Procedure: BIOPSY;  Surgeon: Eloise Harman, DO;  [facility];  Service: Endoscopy;;   BREAST REDUCTION SURGERY Bilateral 01/21/2018   Procedure: BREAST REDUCTION WITH LIPOSUCTION;  Surgeon: Cristine Polio, MD;  Location: [facility];  Service: Plastics;  Laterality: Bilateral;   CARDIAC VALVE SURGERY  1968   CARPAL TUNNEL RELEASE Right 03/02/2020   Procedure: CARPAL TUNNEL RELEASE;  Surgeon: Carole Civil, MD;  Location: [facility];  Service: Orthopedics;  Laterality: Right;   CATARACT EXTRACTION W/PHACO Left 05/30/2019   Procedure: CATARACT EXTRACTION PHACO AND INTRAOCULAR LENS PLACEMENT (IOC) (CDE: 4.94  );  Surgeon: Baruch Goldmann, MD;  Location: [facility];  Service: Ophthalmology;  Laterality: Left;   CATARACT EXTRACTION W/PHACO Right 06/16/2019   Procedure: CATARACT EXTRACTION PHACO AND INTRAOCULAR LENS PLACEMENT (IOC);  Surgeon: Baruch Goldmann, MD;  Location: [facility];  Service: Ophthalmology;  Laterality: Right;  CDE: 4.64   CERVICAL FUSION     CHOLECYSTECTOMY     COLONOSCOPY WITH PROPOFOL N/A 09/27/2016   Dr. Gala Romney: Diverticulosis, several tubular adenomas removed ranging 4 to 7 mm in size, internal grade 1 hemorrhoids, terminal ileum normal, segmental biopsies negative for microscopic colitis.  Next colonoscopy June 2021   COLONOSCOPY  WITH PROPOFOL N/A 05/06/2020   internal hemorrhoids, sigmoid diverticulosis. Surveillance colonoscopy due in 2027   ESOPHAGOGASTRODUODENOSCOPY (EGD) WITH PROPOFOL N/A 09/27/2016   Dr. Gala Romney: Small hiatal hernia, mild Schatzki ring status post disruption, LA grade a esophagitis   ESOPHAGOGASTRODUODENOSCOPY (EGD) WITH PROPOFOL N/A 10/18/2020   non-obstructing mild Schatzki ring, gastritis s/ biopsy. Negative H.pylori.   ILIAC ARTERY STENT Left 12/2007   IR ANGIOGRAM EXTREMITY BILATERAL  07/12/2021   IR ANGIOGRAM VISCERAL SELECTIVE  10/29/2020   IR ANGIOGRAM VISCERAL SELECTIVE  07/12/2021   IR ANGIOGRAM VISCERAL SELECTIVE  03/21/2022   IR AORTAGRAM ABDOMINAL SERIALOGRAM  03/21/2022   IR IVUS EACH ADDITIONAL NON CORONARY VESSEL  07/12/2021   IR IVUS EACH ADDITIONAL NON CORONARY VESSEL  03/21/2022   IR PTA NON CORO-LOWER EXTREM  03/21/2022   IR RADIOLOGIST EVAL & MGMT  10/26/2020   IR RADIOLOGIST EVAL & MGMT  11/10/2020   IR RADIOLOGIST EVAL & MGMT  02/09/2021   IR RADIOLOGIST EVAL & MGMT  06/16/2021   IR RADIOLOGIST EVAL & MGMT  08/15/2021   IR RADIOLOGIST EVAL & MGMT  10/18/2021   IR RADIOLOGIST EVAL & MGMT  01/11/2022   IR RADIOLOGIST EVAL & MGMT  03/02/2022   IR THROMBECT PRIM MECH INIT (INCLU) MOD SED  03/21/2022   IR THROMBECT SEC MECH MOD SED  07/12/2021   IR TRANSCATH PLC STENT 1ST ART NOT LE CV CAR VERT CAR  10/29/2020   IR TRANSCATH PLC STENT 1ST ART NOT LE CV CAR VERT CAR  07/12/2021   IR US GUIDE VASC ACCESS RIGHT  10/29/2020   IR US GUIDE VASC ACCESS RIGHT  07/12/2021   IR US GUIDE VASC ACCESS RIGHT  03/21/2022   KNEE ARTHROSCOPY WITH MEDIAL MENISECTOMY Right 01/10/2018   Procedure: RIGHT KNEE ARTHROSCOPY WITH PARTIAL MEDIAL MENISECTOMY;  Surgeon: Carole Civil, MD;  [facility];  Service: Orthopedics;  Laterality: Right;   LEFT AND RIGHT HEART CATHETERIZATION WITH CORONARY ANGIOGRAM N/A 07/31/2014   Procedure: LEFT AND RIGHT HEART CATHETERIZATION WITH CORONARY ANGIOGRAM;   Surgeon: Burnell Blanks, MD;  Location: [facility] CATH LAB;  Service: Cardiovascular;  Laterality: N/A;   MALONEY DILATION N/A 09/27/2016   Procedure: Venia Minks DILATION;  Surgeon: Daneil Dolin, MD;  [facility];  Service: Endoscopy;  Laterality: N/A;   POLYPECTOMY  09/27/2016   Procedure: POLYPECTOMY;  Surgeon: Daneil Dolin, MD;  [facility];  Service: Endoscopy;;  colon   ROTATOR CUFF REPAIR Bilateral    TEE WITHOUT CARDIOVERSION N/A 07/07/2014   Procedure: TRANSESOPHAGEAL ECHOCARDIOGRAM (TEE);  Surgeon: Arnoldo Lenis, MD;  [facility];  Service: Cardiology;  Laterality: N/A;   TEE WITHOUT CARDIOVERSION N/A 07/20/2014   Procedure: TRANSESOPHAGEAL ECHOCARDIOGRAM (TEE) WITH PROPOFOL;  Surgeon: Arnoldo Lenis, MD;  [facility];  Service: Endoscopy;  Laterality: N/A;   TEE WITHOUT  CARDIOVERSION N/A 10/07/2014   Procedure: TRANSESOPHAGEAL ECHOCARDIOGRAM (TEE);  Surgeon: Rexene Alberts, MD;  Location: [place];  Service: Open Heart Surgery;  Laterality: N/A;   Allergies  Allergen Reactions   Iodinated Contrast Media Anaphylaxis, Shortness Of Breath, Itching, Swelling and Other (See Comments)    Patient was given Omni 350 and suffered from itching, chest pain and shortness of breath. Patient was taken to ED after onset of reaction. 10/11/2021  MD zackowski noted pt had tongue and throat swelling, had to give pt epinephrine    Sulfa Antibiotics Nausea And Vomiting   Prior to Admission medications   Medication Sig Start Date End Date Taking? Authorizing Provider  acetaminophen (TYLENOL) 325 MG tablet Take 650 mg by mouth every 6 (six) hours as needed for mild pain, moderate pain, fever or headache.    [provider]  amoxicillin-clavulanate (AUGMENTIN) 875-125 MG tablet Take 1 tablet by mouth 2 (two) times daily for 7 days. 03/22/22 03/29/22  Saverio Danker, PA-C  aspirin EC 81 MG tablet Take 1 tablet (81 mg total) by mouth daily. Swallow  whole. Patient taking differently: Take 81 mg by mouth in the morning. 12/16/19   Arnoldo Lenis, MD  Cholecalciferol (VITAMIN D-3 PO) Take 1 capsule by mouth in the morning.    [provider]  clopidogrel (PLAVIX) 75 MG tablet Take 1 tablet (75 mg total) by mouth daily. Patient taking differently: Take 75 mg by mouth at bedtime. 07/13/21   Theresa Duty, NP  diclofenac (VOLTAREN) 75 MG EC tablet TAKE 1 TABLET TWICE A DAY Patient taking differently: Take 75 mg by mouth 2 (two) times daily. 03/03/21   Carole Civil, MD  dicyclomine (BENTYL) 10 MG capsule Take 1 capsule (10 mg total) by mouth every 8 (eight) hours as needed for spasms. For left-sided discomfort and frequent stools. Watch for constipation, dry mouth, dizziness. Patient not taking: Reported on 03/15/2022 12/06/21   Annitta Needs, NP  estradiol (ESTRACE) 2 MG tablet Take 1 tablet (2 mg total) by mouth daily. Patient taking differently: Take 2 mg by mouth in the morning. 01/19/21   Wendie Agreste, MD  ezetimibe (ZETIA) 10 MG tablet Take 1 tablet (10 mg total) by mouth daily. Patient taking differently: Take 10 mg by mouth in the morning. 01/13/22 01/08/23  Dunn, Lisbeth Renshaw N, PA-C  Flaxseed, Linseed, (FLAX SEED OIL PO) Take 1 capsule by mouth in the morning.    [provider]  hydrocortisone (ANUSOL-HC) 2.5 % rectal cream Place 1 Application rectally 2 (two) times daily. As needed for rectal bleeding. Patient taking differently: Place 1 Application rectally 2 (two) times daily as needed for hemorrhoids (rectal bleeding). 12/06/21   Annitta Needs, NP  losartan (COZAAR) 25 MG tablet TAKE 1 TABLET BY MOUTH DAILY Patient taking differently: Take 25 mg by mouth in the morning. 06/27/21   Wendie Agreste, MD  metoprolol tartrate (LOPRESSOR) 25 MG tablet TAKE 1 TABLET TWICE A DAY 02/24/22   Arnoldo Lenis, MD  Multiple Minerals (CALCIUM-MAGNESIUM-ZINC) TABS Take 1 tablet by mouth in the morning.    [provider]  Multiple Vitamin (MULTIVITAMIN WITH MINERALS) TABS tablet Take 1 tablet by mouth in the morning.    [provider]  NP THYROID 120 MG tablet Take 120 mg by mouth daily before breakfast. 01/11/22   [provider]  oxyCODONE (OXY IR/ROXICODONE) 5 MG immediate release tablet Take 1 tablet (5 mg total) by mouth every 4 (four)  hours as needed for moderate pain. 03/22/22   Saverio Danker, PA-C  progesterone (PROMETRIUM) 200 MG capsule Take 2 capsules (400 mg total) by mouth daily. Patient taking differently: Take 400 mg by mouth at bedtime. 01/19/21 04/29/22  Wendie Agreste, MD  RABEprazole (ACIPHEX) 20 MG tablet Take 1 tablet (20 mg total) by mouth 2 (two) times daily before a meal. 02/27/22   Mahon, Lenise Arena, NP  rosuvastatin (CRESTOR) 40 MG tablet TAKE 1 TABLET DAILY Patient taking differently: Take 40 mg by mouth at bedtime. 01/23/22   Arnoldo Lenis, MD  triamcinolone ointment (KENALOG) 0.1 % Apply 1 Application topically 2 (two) times daily as needed. Patient taking differently: Apply 1 Application topically 2 (two) times daily as needed (irritation). 11/25/21   Valentina Shaggy, MD   Social History   Socioeconomic History   Marital status: Married    Spouse name: Not on file   Number of children: 0   Years of education: some coll   Highest education level: Not on file  Occupational History   Occupation: call center    Employer: [employer]    Comment: [employer]  Tobacco Use   Smoking status: Former    Packs/day: 0.50    Years: 40.00    Total pack years: 20.00    Types: Cigarettes    Quit date: 05/2021    Years since quitting: 0.8   Smokeless tobacco: Never  Vaping Use   Vaping Use: Never used  Substance and Sexual Activity   Alcohol use: Yes    Alcohol/week: 2.0 standard drinks of alcohol    Types: 2 Cans of beer per week   Drug use: No   Sexual activity: Yes    Birth control/protection: None  Other Topics Concern   Not on file  Social  History Narrative   Patient is married   for 5 years . Patient works at [employer].. Some college education-did not Writer.Originally from [place].   Social Determinants of Health   Financial Resource Strain: Not on file  Food Insecurity: No Food Insecurity (03/22/2022)   Hunger Vital Sign    Worried About Running Out of Food in the Last Year: Never true    Ran Out of Food in the Last Year: Never true  Transportation Needs: No Transportation Needs (03/22/2022)   PRAPARE - Hydrologist (Medical): No    Lack of Transportation (Non-Medical): No  Physical Activity: Not on file  Stress: Not on file  Social Connections: Not on file  Intimate Partner Violence: Not At Risk (03/22/2022)   Humiliation, Afraid, Rape, and Kick questionnaire    Fear of Current or Ex-Partner: No    Emotionally Abused: No    Physically Abused: No    Sexually Abused: No    Review of Systems Per HPI.   Objective:   Vitals:   03/27/22 1025  BP: 138/78  Pulse: 69  Temp: 97.8 F (36.6 C)  SpO2: 96%  Weight: 218 lb 6.4 oz (99.1 kg)  Height: '5\' 4"'$  (1.626 m)     Physical Exam Vitals reviewed.  Constitutional:      General: She is not in acute distress.    Appearance: Normal appearance. She is well-developed. She is not ill-appearing or diaphoretic.  HENT:     Head: Normocephalic and atraumatic.  Eyes:     Conjunctiva/sclera: Conjunctivae normal.     Pupils: Pupils are equal, round, and reactive to light.  Neck:  Vascular: No carotid bruit.  Cardiovascular:     Rate and Rhythm: Normal rate and regular rhythm.     Heart sounds: Normal heart sounds.  Pulmonary:     Effort: Pulmonary effort is normal.     Breath sounds: Normal breath sounds.  Abdominal:     Palpations: Abdomen is soft. There is no pulsatile mass.     Tenderness: There is abdominal tenderness (diffuse.).  Musculoskeletal:     Right lower leg: No edema.     Left lower leg: No  edema.  Skin:    General: Skin is warm and dry.  Neurological:     Mental Status: She is alert and oriented to person, place, and time.  Psychiatric:        Mood and Affect: Mood normal.        Behavior: Behavior normal.      Assessment & Plan:  Michelle Patton is a 66 y.o. female . Acute perforated appendicitis - Plan: CBC  Chronic mesenteric ischemia ([place]) - Plan: CBC  History of hypokalemia - Plan: Basic metabolic panel  Status post hospitalization for perforated appendicitis, procedure as above for chronic mesenteric ischemia, thrombosed SMA.  Still having abdominal discomfort, but no fevers, tolerating p.o. fluids.  Previous downtrend of leukocytosis, tolerating antibiotics.  Check updated CBC, BMP with prior hypokalemia, continue follow-up with surgeon.  Plans to return to work as she works remotely in 1 week, but can adjust work restrictions accordingly if needed depending on her symptoms.  She would like to see new gastroenterologist, she will let me know specific office near her home and can place referral once I hear from her.  No orders of the defined types were placed in this encounter.  Patient Instructions  So sorry you have gone through what you have gone through. Glad things are improving, but keep follow up with surgeon as planned. I will check some labs. Let me know the practice/provider you would like to be referred to for gastroenterology. I will work on [employer] paperwork.   Hang in there!     Signed,   Merri Ray, MD [place] 03/27/22 11:24 AM

## 2022-03-27 NOTE — Telephone Encounter (Signed)
Received disability from [place]. Placed in front bin with charge sheet

## 2022-03-27 NOTE — Telephone Encounter (Signed)
I received paperwork from the front and put in Dr. Vonna Kotyk folder to review

## 2022-03-27 NOTE — Patient Instructions (Addendum)
So sorry you have gone through what you have gone through. Glad things are improving, but keep follow up with surgeon as planned. I will check some labs. Let me know the practice/provider you would like to be referred to for gastroenterology. I will work on [employer] paperwork.   Hang in there!

## 2022-03-28 ENCOUNTER — Encounter: Payer: Self-pay | Admitting: Family Medicine

## 2022-03-29 NOTE — Telephone Encounter (Signed)
Paperwork completed and placed in fax bin at back nurse station  

## 2022-03-29 NOTE — Telephone Encounter (Signed)
Faxed paperwork over to [phone]

## 2022-03-30 NOTE — Telephone Encounter (Signed)
I am fine if we extend her out of work note for 1 additional week, but if she is having increasing pain should seek care through urgent clinic with surgery or in office eval with any provider for recheck blood count and exam given slight elevated WBC last visit, and undergoing treatment for ruptured appendicitis.

## 2022-03-30 NOTE — Telephone Encounter (Signed)
Pt Is still not feeling well wondered if she had to return to work Monday or if she can wait for return

## 2022-03-31 ENCOUNTER — Encounter: Payer: Self-pay | Admitting: Family Medicine

## 2022-03-31 ENCOUNTER — Telehealth: Payer: Medicare Other

## 2022-03-31 NOTE — Telephone Encounter (Signed)
Patient came in office today to pick up her doctor note. Patient is asking to get FMLA extend to 04/10/22.

## 2022-04-03 ENCOUNTER — Telehealth: Payer: Self-pay | Admitting: Family Medicine

## 2022-04-03 ENCOUNTER — Encounter: Payer: Self-pay | Admitting: Family Medicine

## 2022-04-03 NOTE — Telephone Encounter (Signed)
I am okay extending through the 18th.  I have completed  a note (cma folder in back), but may need to also addend the paperwork, can we find that for me to addend?  Thanks.

## 2022-04-03 NOTE — Telephone Encounter (Signed)
Error

## 2022-04-05 ENCOUNTER — Encounter: Payer: Self-pay | Admitting: Family Medicine

## 2022-04-05 ENCOUNTER — Ambulatory Visit (HOSPITAL_COMMUNITY)
Admission: RE | Admit: 2022-04-05 | Discharge: 2022-04-05 | Disposition: A | Discharge status: Home / Self Care | Payer: Medicare Other | Source: Ambulatory Visit | Attending: Family Medicine | Admitting: Family Medicine

## 2022-04-05 ENCOUNTER — Other Ambulatory Visit (INDEPENDENT_AMBULATORY_CARE_PROVIDER_SITE_OTHER): Payer: Medicare Other

## 2022-04-05 ENCOUNTER — Ambulatory Visit: Payer: Medicare Other | Admitting: Family Medicine

## 2022-04-05 VITALS — BP 138/78 | HR 81 | Temp 98.7°F | Ht 64.0 in | Wt 215.0 lb

## 2022-04-05 DIAGNOSIS — K3532 Acute appendicitis with perforation and localized peritonitis, without abscess: Secondary | ICD-10-CM | POA: Insufficient documentation

## 2022-04-05 DIAGNOSIS — R1031 Right lower quadrant pain: Secondary | ICD-10-CM | POA: Insufficient documentation

## 2022-04-05 DIAGNOSIS — D72829 Elevated white blood cell count, unspecified: Secondary | ICD-10-CM

## 2022-04-05 DIAGNOSIS — K551 Chronic vascular disorders of intestine: Secondary | ICD-10-CM | POA: Insufficient documentation

## 2022-04-05 LAB — CBC
HCT: 33.2 % — ABNORMAL LOW (ref 36.0–46.0)
Hemoglobin: 11.4 g/dL — ABNORMAL LOW (ref 12.0–15.0)
MCHC: 34.3 g/dL (ref 30.0–36.0)
MCV: 84.2 fl (ref 78.0–100.0)
Platelets: 535 10*3/uL — ABNORMAL HIGH (ref 150.0–400.0)
RBC: 3.95 Mil/uL (ref 3.87–5.11)
RDW: 13.2 % (ref 11.5–15.5)
WBC: 10.7 10*3/uL — ABNORMAL HIGH (ref 4.0–10.5)

## 2022-04-05 MED ORDER — BARIUM SULFATE 2 % PO SUSP
ORAL | Status: AC
  Filled 2022-04-05: qty 2

## 2022-04-05 MED ORDER — HYDROCODONE-ACETAMINOPHEN 5-325 MG PO TABS: 1.0000 | ORAL_TABLET | Freq: Four times a day (QID) | ORAL | 0 refills | Status: DC | PRN

## 2022-04-05 NOTE — Telephone Encounter (Signed)
I have the note she has an appointment today at 10:40 she said she will get it then

## 2022-04-05 NOTE — Progress Notes (Signed)
Subjective:  Patient ID: Michelle Patton, female    DOB: 1956-03-30  Age: 66 y.o. MRN: [id number]  CC:  Chief Complaint  Patient presents with   Right Stomach Pain     Pt is here for a follow up for her appendix, pt still having a lot of pain in her right side, appetite still not the same     HPI Michelle Patton presents for   Abdominal pain, acute perforated appendicitis Hospital follow-up December 4.  Continued discomfort at that time, plan for delayed/interval appendectomy in 6 to 8 weeks as long as improving.  Treated with Augmentin.  Also had angioplasty of superior mesenteric artery during that hospitalization and was resumed on her aspirin and Plavix,History of chronic mesenteric ischemia.  She was taking oxycodone once at night at her last visit with Tylenol, pain seemed to be worse at night.  FMLA paperwork completed with out of work through Monday initially, see recent phone notes, with plan to extend time out of work.  She does work remotely.  Additionally new GI referral at last visit.  Plan on referral once she advised me of specific office that she would like to see. Planned RTW on 12/18.  CBC, BMP obtained with prior hypokalemia.  Slight increase in leukocytes with ER precautions given.  BMP was normal.  Visit with surgery January 3rd.  Last dose augmentin 1 week ago.  No known fever, no chills.  Abdominal pain overall same, but worse at night. Out of oxycodone - ran out.  Tylenol at night - not as effective. No vomiting.  Still decreased appetite, drinking fluids, some food. 1 meal per day.  Surgeon Dr. Rosendo Gros.  Last BM this am.normal  Controlled substance database (PDMP) reviewed. No concerns appreciated.  Last filled oxycodone '5mg'$  #15, on 03/22/22.  No cough/dyspnea.   Last CT abdomen on 03/19/22.   Lab Results  Component Value Date   WBC 12.2 (H) 03/27/2022   HGB 11.2 (L) 03/27/2022   HCT 33.4 (L) 03/27/2022   MCV 85.7 03/27/2022   PLT 622.0 (H) 03/27/2022        History Patient Active Problem List   Diagnosis Date Noted   Leukocytosis 03/15/2022   Mixed hyperlipidemia 03/15/2022   Acquired hypothyroidism 03/15/2022   Hypoalbuminemia due to protein-calorie malnutrition ([place]) 03/15/2022   Body mass index (BMI) 34.0-34.9, adult 03/15/2022   Acute perforated appendicitis 03/14/2022   Allergy, drug 01/13/2022   Melena 12/06/2021   Prolapsed internal hemorrhoids, grade 3 12/06/2021   Left lower quadrant abdominal pain 12/06/2021   Acute anaphylaxis 10/11/2021   Mesenteric ischemia ([place]) 07/12/2021   Chronic mesenteric ischemia ([place]) 02/22/2021   Elevated LFTs 10/05/2020   Class 2 severe obesity due to excess calories with serious comorbidity and body mass index (BMI) of 36.0 to 36.9 in adult [facility]) 08/09/2020   PAD (peripheral artery disease) ([place]) 08/09/2020   Aortic valve prosthesis present 36/14/4315   Chronic systolic congestive heart failure ([place]) 08/09/2020   OSA and COPD overlap syndrome ([place]) 08/09/2020   s/p right carpal tunnel release 03/02/20 04/06/2020   Closed displaced fracture of proximal phalanx of lesser toe of right foot 12/31/2019   Carpal tunnel syndrome of right wrist 12/05/2019   Nondisplaced fracture of fifth right metatarsal bone with routine healing 10/21/2019   Skin lesion 04/11/2019   Low back pain without sciatica 01/20/2019   Pain of upper abdomen 01/14/2019   Fatigue 12/09/2018   Breast cancer screening 12/09/2018   Degenerative tear  of triangular fibrocartilage complex (TFCC) of left wrist 10/23/2018   S/P right knee arthroscopy 01/10/18 01/18/2018   Chondromalacia, patella, right    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    Personal history of colonic polyps 12/28/2016   Abdominal pain, epigastric 08/10/2016   Left sided abdominal pain 08/10/2016   Esophageal dysphagia 08/10/2016   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   Supravalvular aortic  stenosis, congenital - s/p repair during childhood    Essential hypertension    GERD (gastroesophageal reflux disease)    Aortic stenosis, severe 07/20/2014   Aortic regurgitation 07/20/2014   Leg cramps 09/26/2012   Occlusion and stenosis of carotid artery without mention of cerebral infarction 07/10/2012   Peripheral vascular disease, unspecified ([place]) 06/12/2012   Carotid artery bruit 06/12/2012   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   Redo sternotomy for 21 mm Medtronic Freestyle porcine aortic root graft w/ reimplantation of left main and right coronary arteries   Sleep apnea    diagnosed multiple years ago at [facility] aortic stenosis, congenital - s/p repair during childhood    Past Surgical History:  Procedure Laterality Date   ABDOMINAL AORTAGRAM  06/24/2012   ABDOMINAL AORTAGRAM N/A 06/24/2012   Procedure: ABDOMINAL Maxcine Ham;  Surgeon: Angelia Mould, MD;  Location: [facility] C[facility] LAB;  Service: Cardiovascular;  Laterality: N/A;   AORTIC VALVE REPLACEMENT N/A 10/07/2014   Procedure: REDO AORTIC VALVE REPLACEMENT (AVR);  Surgeon: Rexene Alberts, MD;  Location: [place];  Service: Open Heart Surgery;  Laterality: N/A;   ASCENDING AORTIC ROOT REPLACEMENT N/A 10/07/2014   Procedure: ASCENDING AORTIC ROOT REPLACEMENT;  Surgeon: Rexene Alberts, MD;  Location: [place];  Service: Open Heart Surgery;  Laterality: N/A;   BIOPSY  09/27/2016   Procedure: BIOPSY;  Surgeon:  Daneil Dolin, MD;  [facility];  Service: Endoscopy;;  colon   BIOPSY  10/18/2020   Procedure: BIOPSY;  Surgeon: Eloise Harman, DO;  [facility];  Service: Endoscopy;;   BREAST REDUCTION SURGERY Bilateral 01/21/2018   Procedure: BREAST REDUCTION WITH LIPOSUCTION;  Surgeon: Cristine Polio, MD;  Location: [place];  Service: Plastics;  Laterality: Bilateral;   CARDIAC VALVE SURGERY  1968   CARPAL TUNNEL RELEASE Right 03/02/2020   Procedure: CARPAL TUNNEL RELEASE;  Surgeon: Carole Civil, MD;  Location: [facility];  Service: Orthopedics;  Laterality: Right;   CATARACT EXTRACTION W/PHACO Left 05/30/2019   Procedure: CATARACT EXTRACTION PHACO AND INTRAOCULAR LENS PLACEMENT (IOC) (CDE: 4.94  );  Surgeon: Baruch Goldmann, MD;  Location: [facility];  Service: Ophthalmology;  Laterality: Left;   CATARACT EXTRACTION W/PHACO Right 06/16/2019   Procedure: CATARACT EXTRACTION PHACO AND INTRAOCULAR LENS PLACEMENT (IOC);  Surgeon: Baruch Goldmann, MD;  Location: [facility];  Service: Ophthalmology;  Laterality: Right;  CDE: 4.64   CERVICAL FUSION     CHOLECYSTECTOMY  COLONOSCOPY WITH PROPOFOL N/A 09/27/2016   Dr. Gala Romney: Diverticulosis, several tubular adenomas removed ranging 4 to 7 mm in size, internal grade 1 hemorrhoids, terminal ileum normal, segmental biopsies negative for microscopic colitis.  Next colonoscopy June 2021   COLONOSCOPY WITH PROPOFOL N/A 05/06/2020   internal hemorrhoids, sigmoid diverticulosis. Surveillance colonoscopy due in 2027   ESOPHAGOGASTRODUODENOSCOPY (EGD) WITH PROPOFOL N/A 09/27/2016   Dr. Gala Romney: Small hiatal hernia, mild Schatzki ring status post disruption, LA grade a esophagitis   ESOPHAGOGASTRODUODENOSCOPY (EGD) WITH PROPOFOL N/A 10/18/2020   non-obstructing mild Schatzki ring, gastritis s/ biopsy. Negative H.pylori.   ILIAC ARTERY STENT Left 12/2007   IR ANGIOGRAM EXTREMITY BILATERAL  07/12/2021   IR ANGIOGRAM VISCERAL  SELECTIVE  10/29/2020   IR ANGIOGRAM VISCERAL SELECTIVE  07/12/2021   IR ANGIOGRAM VISCERAL SELECTIVE  03/21/2022   IR AORTAGRAM ABDOMINAL SERIALOGRAM  03/21/2022   IR IVUS EACH ADDITIONAL NON CORONARY VESSEL  07/12/2021   IR IVUS EACH ADDITIONAL NON CORONARY VESSEL  03/21/2022   IR PTA NON CORO-LOWER EXTREM  03/21/2022   IR RADIOLOGIST EVAL & MGMT  10/26/2020   IR RADIOLOGIST EVAL & MGMT  11/10/2020   IR RADIOLOGIST EVAL & MGMT  02/09/2021   IR RADIOLOGIST EVAL & MGMT  06/16/2021   IR RADIOLOGIST EVAL & MGMT  08/15/2021   IR RADIOLOGIST EVAL & MGMT  10/18/2021   IR RADIOLOGIST EVAL & MGMT  01/11/2022   IR RADIOLOGIST EVAL & MGMT  03/02/2022   IR THROMBECT PRIM MECH INIT (INCLU) MOD SED  03/21/2022   IR THROMBECT SEC MECH MOD SED  07/12/2021   IR TRANSCATH PLC STENT 1ST ART NOT LE CV CAR VERT CAR  10/29/2020   IR TRANSCATH PLC STENT 1ST ART NOT LE CV CAR VERT CAR  07/12/2021   IR US GUIDE VASC ACCESS RIGHT  10/29/2020   IR US GUIDE VASC ACCESS RIGHT  07/12/2021   IR US GUIDE VASC ACCESS RIGHT  03/21/2022   KNEE ARTHROSCOPY WITH MEDIAL MENISECTOMY Right 01/10/2018   Procedure: RIGHT KNEE ARTHROSCOPY WITH PARTIAL MEDIAL MENISECTOMY;  Surgeon: Carole Civil, MD;  [facility];  Service: Orthopedics;  Laterality: Right;   LEFT AND RIGHT HEART CATHETERIZATION WITH CORONARY ANGIOGRAM N/A 07/31/2014   Procedure: LEFT AND RIGHT HEART CATHETERIZATION WITH CORONARY ANGIOGRAM;  Surgeon: Burnell Blanks, MD;  Location: [facility] CATH LAB;  Service: Cardiovascular;  Laterality: N/A;   MALONEY DILATION N/A 09/27/2016   Procedure: Venia Minks DILATION;  Surgeon: Daneil Dolin, MD;  [facility];  Service: Endoscopy;  Laterality: N/A;   POLYPECTOMY  09/27/2016   Procedure: POLYPECTOMY;  Surgeon: Daneil Dolin, MD;  [facility];  Service: Endoscopy;;  colon   ROTATOR CUFF REPAIR Bilateral    TEE WITHOUT CARDIOVERSION N/A 07/07/2014   Procedure: TRANSESOPHAGEAL ECHOCARDIOGRAM  (TEE);  Surgeon: Arnoldo Lenis, MD;  [facility];  Service: Cardiology;  Laterality: N/A;   TEE WITHOUT CARDIOVERSION N/A 07/20/2014   Procedure: TRANSESOPHAGEAL ECHOCARDIOGRAM (TEE) WITH PROPOFOL;  Surgeon: Arnoldo Lenis, MD;  [facility]: [facility];  Service: Endoscopy;  Laterality: N/A;   TEE WITHOUT CARDIOVERSION N/A 10/07/2014   Procedure: TRANSESOPHAGEAL ECHOCARDIOGRAM (TEE);  Surgeon: Rexene Alberts, MD;  Location: [place];  Service: Open Heart Surgery;  Laterality: N/A;   Allergies  Allergen Reactions   Iodinated Contrast Media Anaphylaxis, Shortness Of Breath, Itching, Swelling and Other (See Comments)    Patient was given Omni 350 and suffered from itching, chest pain and  shortness of breath. Patient was taken to ED after onset of reaction. 10/11/2021  MD zackowski noted pt had tongue and throat swelling, had to give pt epinephrine    Sulfa Antibiotics Nausea And Vomiting   Prior to Admission medications   Medication Sig Start Date End Date Taking? Authorizing Provider  acetaminophen (TYLENOL) 325 MG tablet Take 650 mg by mouth every 6 (six) hours as needed for mild pain, moderate pain, fever or headache.   Yes [provider]  aspirin EC 81 MG tablet Take 1 tablet (81 mg total) by mouth daily. Swallow whole. Patient taking differently: Take 81 mg by mouth in the morning. 12/16/19  Yes Branch, Alphonse Guild, MD  Cholecalciferol (VITAMIN D-3 PO) Take 1 capsule by mouth in the morning.   Yes [provider]  clopidogrel (PLAVIX) 75 MG tablet Take 1 tablet (75 mg total) by mouth daily. Patient taking differently: Take 75 mg by mouth at bedtime. 07/13/21  Yes Covington, Roselyn Reef R, NP  diclofenac (VOLTAREN) 75 MG EC tablet TAKE 1 TABLET TWICE A DAY Patient taking differently: Take 75 mg by mouth 2 (two) times daily. 03/03/21  Yes Carole Civil, MD  dicyclomine (BENTYL) 10 MG capsule Take 1 capsule (10 mg total) by mouth every 8 (eight) hours as needed for  spasms. For left-sided discomfort and frequent stools. Watch for constipation, dry mouth, dizziness. 12/06/21  Yes Annitta Needs, NP  estradiol (ESTRACE) 2 MG tablet Take 1 tablet (2 mg total) by mouth daily. Patient taking differently: Take 2 mg by mouth in the morning. 01/19/21  Yes Wendie Agreste, MD  ezetimibe (ZETIA) 10 MG tablet Take 1 tablet (10 mg total) by mouth daily. Patient taking differently: Take 10 mg by mouth in the morning. 01/13/22 01/08/23 Yes Dunn, Dayna N, PA-C  Flaxseed, Linseed, (FLAX SEED OIL PO) Take 1 capsule by mouth in the morning.   Yes [provider]  hydrocortisone (ANUSOL-HC) 2.5 % rectal cream Place 1 Application rectally 2 (two) times daily. As needed for rectal bleeding. Patient taking differently: Place 1 Application rectally 2 (two) times daily as needed for hemorrhoids (rectal bleeding). 12/06/21  Yes Annitta Needs, NP  losartan (COZAAR) 25 MG tablet TAKE 1 TABLET BY MOUTH DAILY Patient taking differently: Take 25 mg by mouth in the morning. 06/27/21  Yes Wendie Agreste, MD  metoprolol tartrate (LOPRESSOR) 25 MG tablet TAKE 1 TABLET TWICE A DAY 02/24/22  Yes Branch, Alphonse Guild, MD  Multiple Minerals (CALCIUM-MAGNESIUM-ZINC) TABS Take 1 tablet by mouth in the morning.   Yes [provider]  Multiple Vitamin (MULTIVITAMIN WITH MINERALS) TABS tablet Take 1 tablet by mouth in the morning.   Yes [provider]  NP THYROID 120 MG tablet Take 120 mg by mouth daily before breakfast. 01/11/22  Yes [provider]  oxyCODONE (OXY IR/ROXICODONE) 5 MG immediate release tablet Take 1 tablet (5 mg total) by mouth every 4 (four) hours as needed for moderate pain. 03/22/22  Yes Saverio Danker, PA-C  progesterone (PROMETRIUM) 200 MG capsule Take 2 capsules (400 mg total) by mouth daily. Patient taking differently: Take 400 mg by mouth at bedtime. 01/19/21 04/29/22 Yes Wendie Agreste, MD  RABEprazole (ACIPHEX) 20 MG tablet Take 1 tablet (20 mg  total) by mouth 2 (two) times daily before a meal. 02/27/22  Yes Mahon, Courtney L, NP  rosuvastatin (CRESTOR) 40 MG tablet TAKE 1 TABLET DAILY Patient taking differently: Take 40 mg by mouth at bedtime. 01/23/22  Yes Arnoldo Lenis, MD  triamcinolone ointment (KENALOG) 0.1 % Apply 1 Application topically 2 (two) times daily as needed. Patient taking differently: Apply 1 Application topically 2 (two) times daily as needed (irritation). 11/25/21  Yes Valentina Shaggy, MD   Social History   Socioeconomic History   Marital status: Married    Spouse name: Not on file   Number of children: 0   Years of education: some coll   Highest education level: Not on file  Occupational History   Occupation: call center    Employer: [employer]    Comment: [employer]  Tobacco Use   Smoking status: Former    Packs/day: 0.50    Years: 40.00    Total pack years: 20.00    Types: Cigarettes    Quit date: 05/2021    Years since quitting: 0.8   Smokeless tobacco: Never  Vaping Use   Vaping Use: Never used  Substance and Sexual Activity   Alcohol use: Yes    Alcohol/week: 2.0 standard drinks of alcohol    Types: 2 Cans of beer per week   Drug use: No   Sexual activity: Yes    Birth control/protection: None  Other Topics Concern   Not on file  Social History Narrative   Patient is married   for 5 years . Patient works at [employer].. Some college education-did not Writer.Originally from [place].   Social Determinants of Health   Financial Resource Strain: Not on file  Food Insecurity: No Food Insecurity (03/22/2022)   Hunger Vital Sign    Worried About Running Out of Food in the Last Year: Never true    Ran Out of Food in the Last Year: Never true  Transportation Needs: No Transportation Needs (03/22/2022)   PRAPARE - Hydrologist (Medical): No    Lack of Transportation (Non-Medical): No  Physical Activity: Not on file  Stress: Not on file   Social Connections: Not on file  Intimate Partner Violence: Not At Risk (03/22/2022)   Humiliation, Afraid, Rape, and Kick questionnaire    Fear of Current or Ex-Partner: No    Emotionally Abused: No    Physically Abused: No    Sexually Abused: No    Review of Systems  Per HPI.  Objective:   Vitals:   04/05/22 1044  BP: 138/78  Pulse: 81  Temp: 98.7 F (37.1 C)  SpO2: 99%  Weight: 215 lb (97.5 kg)  Height: '5\' 4"'$  (1.626 m)     Physical Exam Vitals reviewed.  Constitutional:      Appearance: Normal appearance. She is well-developed.  HENT:     Head: Normocephalic and atraumatic.  Eyes:     Conjunctiva/sclera: Conjunctivae normal.     Pupils: Pupils are equal, round, and reactive to light.  Neck:     Vascular: No carotid bruit.  Cardiovascular:     Rate and Rhythm: Normal rate and regular rhythm.     Heart sounds: Normal heart sounds.  Pulmonary:     Effort: Pulmonary effort is normal.     Breath sounds: Normal breath sounds.  Abdominal:     General: There is no distension.     Palpations: Abdomen is soft. There is no pulsatile mass.     Tenderness: There is abdominal tenderness (Right lower quadrant). There is guarding. There is no rebound.  Musculoskeletal:     Right lower leg: No edema.     Left lower leg: No edema.  Skin:    General: Skin is warm and dry.  Neurological:     Mental Status: She is alert and oriented to person, place, and time.  Psychiatric:        Mood and Affect: Mood normal.        Behavior: Behavior normal.        Assessment & Plan:  Michelle Patton is a 66 y.o. female . Acute perforated appendicitis - Plan: HYDROcodone-acetaminophen (NORCO/VICODIN) 5-325 MG tablet, CT Abdomen Pelvis Wo Contrast, CBC  Chronic mesenteric ischemia (HCC) - Plan: HYDROcodone-acetaminophen (NORCO/VICODIN) 5-325 MG tablet, CT Abdomen Pelvis Wo Contrast, CBC  Right lower quadrant abdominal pain - Plan: HYDROcodone-acetaminophen (NORCO/VICODIN) 5-325  MG tablet, CT Abdomen Pelvis Wo Contrast, CBC  Leukocytosis, unspecified type - Plan: CT Abdomen Pelvis Wo Contrast, CBC  As above, appendicitis with planned interval appendectomy.  Status post Augmentin, off antibiotics for past week.  Persistent right-sided pain, worse symptoms at night.  Prior slight increase leukocytosis.  Previous CT findings as above.  No dyspnea or cough.  Lungs were clear.  With persistent pain, increased WBC last visit, check updated CT and CBC.  Hydrocodone temporarily for pain with potential side effects discussed.  Depending on results can discuss plan with general surgery.  ER precautions given.  Meds ordered this encounter  Medications   HYDROcodone-acetaminophen (NORCO/VICODIN) 5-325 MG tablet    Sig: Take 1 tablet by mouth every 6 (six) hours as needed for moderate pain.    Dispense:  20 tablet    Refill:  0   Patient Instructions  Hydrocodone as needed for pain, use that sparingly.  Mild pain can be treated with Tylenol.  I have ordered CT scan you should receive a call about that study.  Also have CBC/blood work at [facility] location below when possible in the next day or 2.  Depending on results of CT scan and blood work can discuss other treatment if needed with Psychologist, sport and exercise.  If any worsening pain, fevers, or other new/worsening symptoms be seen in the emergency room.    Signed,   Merri Ray, MD [place] 04/05/22 11:30 AM

## 2022-04-05 NOTE — Patient Instructions (Signed)
Hydrocodone as needed for pain, use that sparingly.  Mild pain can be treated with Tylenol.  I have ordered CT scan you should receive a call about that study.  Also have CBC/blood work at [facility] location below when possible in the next day or 2.  Depending on results of CT scan and blood work can discuss other treatment if needed with Psychologist, sport and exercise.  If any worsening pain, fevers, or other new/worsening symptoms be seen in the emergency room.

## 2022-04-12 ENCOUNTER — Other Ambulatory Visit: Payer: Self-pay | Admitting: Family Medicine

## 2022-04-12 DIAGNOSIS — I5022 Chronic systolic (congestive) heart failure: Secondary | ICD-10-CM

## 2022-04-12 DIAGNOSIS — I10 Essential (primary) hypertension: Secondary | ICD-10-CM

## 2022-04-21 ENCOUNTER — Ambulatory Visit (HOSPITAL_COMMUNITY)
Admission: RE | Admit: 2022-04-21 | Discharge: 2022-04-21 | Disposition: A | Discharge status: Home / Self Care | Payer: Medicare Other | Source: Ambulatory Visit | Attending: Interventional Radiology | Admitting: Interventional Radiology

## 2022-04-21 ENCOUNTER — Other Ambulatory Visit (HOSPITAL_COMMUNITY): Payer: Self-pay | Admitting: General Surgery

## 2022-04-21 DIAGNOSIS — K559 Vascular disorder of intestine, unspecified: Secondary | ICD-10-CM | POA: Diagnosis not present

## 2022-04-21 DIAGNOSIS — K3532 Acute appendicitis with perforation and localized peritonitis, without abscess: Secondary | ICD-10-CM

## 2022-05-03 ENCOUNTER — Other Ambulatory Visit: Payer: Self-pay | Admitting: Radiology

## 2022-05-03 ENCOUNTER — Encounter: Payer: Self-pay | Admitting: Gastroenterology

## 2022-05-03 MED ORDER — CLOPIDOGREL BISULFATE 75 MG PO TABS: 75.0000 mg | ORAL_TABLET | Freq: Every day | ORAL | 5 refills | Status: DC

## 2022-05-03 MED ORDER — CLOPIDOGREL BISULFATE 75 MG PO TABS: 75.0000 mg | ORAL_TABLET | Freq: Every day | ORAL | 3 refills | Status: DC

## 2022-05-09 ENCOUNTER — Ambulatory Visit (HOSPITAL_COMMUNITY)
Admission: RE | Admit: 2022-05-09 | Discharge: 2022-05-09 | Disposition: A | Discharge status: Home / Self Care | Payer: Medicare Other | Source: Ambulatory Visit | Attending: General Surgery | Admitting: General Surgery

## 2022-05-09 DIAGNOSIS — K3532 Acute appendicitis with perforation and localized peritonitis, without abscess: Secondary | ICD-10-CM | POA: Diagnosis not present

## 2022-05-11 ENCOUNTER — Ambulatory Visit
Admission: RE | Admit: 2022-05-11 | Discharge: 2022-05-11 | Disposition: A | Discharge status: Home / Self Care | Payer: Medicare Other | Source: Ambulatory Visit | Attending: Physician Assistant | Admitting: Physician Assistant

## 2022-05-11 DIAGNOSIS — K559 Vascular disorder of intestine, unspecified: Secondary | ICD-10-CM

## 2022-05-11 HISTORY — PX: IR RADIOLOGIST EVAL & MGMT: IMG5224

## 2022-05-11 NOTE — Progress Notes (Signed)
Reason for follow up:  Virtual telephone visit 6 weeks status post mesenteric angiogram, thrombectomy, and drug coated balloon angioplasty of SMA  History of present illness: 67 year old female with history of atherosclerotic disease including peripheral artery disease and chronic mesenteric ischemia now status post superior mesenteric artery covered stent placement on 10/29/20 and stent recanalization with re-lining on 07/12/21.   She was hospitalized in late November 2023 for acute appendicitis with thrombosed SMA stent.  She underwent mesenteric angiogram (CO2 + IVUS) and SMA thrombectomy with DCB angioplasty of the previously placed stents on 03/21/22.  She was discharged home the following day.    Remains on ASA and Plavix.  No new abdominal pain, still has persistent RLQ pain, planning to see Dr. Rosendo Gros soon to follow up on possible appendectomy.    She underwent mesenteric duplex on 04/21/22.  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   Redo sternotomy for 21 mm Medtronic Freestyle porcine aortic root graft w/ reimplantation of left main and right coronary arteries   Sleep apnea    diagnosed multiple years ago at [facility] aortic stenosis, congenital - s/p repair during childhood     Past Surgical History:  Procedure Laterality Date   ABDOMINAL AORTAGRAM  06/24/2012   ABDOMINAL AORTAGRAM N/A 06/24/2012   Procedure: ABDOMINAL Maxcine Ham;  Surgeon: Angelia Mould,  MD;  Location: [facility] CATH LAB;  Service: Cardiovascular;  Laterality: N/A;   AORTIC VALVE REPLACEMENT N/A 10/07/2014   Procedure: REDO AORTIC VALVE REPLACEMENT (AVR);  Surgeon: Rexene Alberts, MD;  Location: [place];  Service: Open Heart Surgery;  Laterality: N/A;   ASCENDING AORTIC ROOT REPLACEMENT N/A 10/07/2014   Procedure: ASCENDING AORTIC ROOT REPLACEMENT;  Surgeon: Rexene Alberts, MD;  Location: [place];  Service: Open Heart Surgery;  Laterality: N/A;   BIOPSY  09/27/2016   Procedure: BIOPSY;  Surgeon: Daneil Dolin, MD;  [facility];  Service: Endoscopy;;  colon   BIOPSY  10/18/2020   Procedure: BIOPSY;  Surgeon: Eloise Harman, DO;  [facility];  Service: Endoscopy;;   BREAST REDUCTION SURGERY Bilateral 01/21/2018   Procedure: BREAST REDUCTION WITH LIPOSUCTION;  Surgeon: Cristine Polio, MD;  Location: [place];  Service: Plastics;  Laterality: Bilateral;   CARDIAC VALVE SURGERY  1968   CARPAL TUNNEL RELEASE Right 03/02/2020   Procedure: CARPAL TUNNEL RELEASE;  Surgeon: Carole Civil, MD;  Location: [facility];  Service: Orthopedics;  Laterality: Right;   CATARACT EXTRACTION W/PHACO Left 05/30/2019   Procedure: CATARACT EXTRACTION PHACO AND INTRAOCULAR LENS PLACEMENT (IOC) (CDE: 4.94  );  Surgeon: Baruch Goldmann, MD;  Location: [facility];  Service: Ophthalmology;  Laterality: Left;   CATARACT EXTRACTION W/PHACO Right 06/16/2019   Procedure: CATARACT EXTRACTION PHACO AND INTRAOCULAR LENS PLACEMENT (IOC);  Surgeon: Baruch Goldmann, MD;  [facility]: [facility];  Service: Ophthalmology;  Laterality: Right;  CDE: 4.64   CERVICAL FUSION  CHOLECYSTECTOMY     COLONOSCOPY WITH PROPOFOL N/A 09/27/2016   Dr. Gala Romney: Diverticulosis, several tubular adenomas removed ranging 4 to 7 mm in size, internal grade 1 hemorrhoids, terminal ileum normal, segmental biopsies negative for microscopic colitis.  Next colonoscopy June 2021   COLONOSCOPY WITH PROPOFOL N/A  05/06/2020   internal hemorrhoids, sigmoid diverticulosis. Surveillance colonoscopy due in 2027   ESOPHAGOGASTRODUODENOSCOPY (EGD) WITH PROPOFOL N/A 09/27/2016   Dr. Gala Romney: Small hiatal hernia, mild Schatzki ring status post disruption, LA grade a esophagitis   ESOPHAGOGASTRODUODENOSCOPY (EGD) WITH PROPOFOL N/A 10/18/2020   non-obstructing mild Schatzki ring, gastritis s/ biopsy. Negative H.pylori.   ILIAC ARTERY STENT Left 12/2007   IR ANGIOGRAM EXTREMITY BILATERAL  07/12/2021   IR ANGIOGRAM VISCERAL SELECTIVE  10/29/2020   IR ANGIOGRAM VISCERAL SELECTIVE  07/12/2021   IR ANGIOGRAM VISCERAL SELECTIVE  03/21/2022   IR AORTAGRAM ABDOMINAL SERIALOGRAM  03/21/2022   IR IVUS EACH ADDITIONAL NON CORONARY VESSEL  07/12/2021   IR IVUS EACH ADDITIONAL NON CORONARY VESSEL  03/21/2022   IR PTA NON CORO-LOWER EXTREM  03/21/2022   IR RADIOLOGIST EVAL & MGMT  10/26/2020   IR RADIOLOGIST EVAL & MGMT  11/10/2020   IR RADIOLOGIST EVAL & MGMT  02/09/2021   IR RADIOLOGIST EVAL & MGMT  06/16/2021   IR RADIOLOGIST EVAL & MGMT  08/15/2021   IR RADIOLOGIST EVAL & MGMT  10/18/2021   IR RADIOLOGIST EVAL & MGMT  01/11/2022   IR RADIOLOGIST EVAL & MGMT  03/02/2022   IR THROMBECT PRIM MECH INIT (INCLU) MOD SED  03/21/2022   IR THROMBECT SEC MECH MOD SED  07/12/2021   IR TRANSCATH PLC STENT 1ST ART NOT LE CV CAR VERT CAR  10/29/2020   IR TRANSCATH PLC STENT 1ST ART NOT LE CV CAR VERT CAR  07/12/2021   IR US GUIDE VASC ACCESS RIGHT  10/29/2020   IR US GUIDE VASC ACCESS RIGHT  07/12/2021   IR US GUIDE VASC ACCESS RIGHT  03/21/2022   KNEE ARTHROSCOPY WITH MEDIAL MENISECTOMY Right 01/10/2018   Procedure: RIGHT KNEE ARTHROSCOPY WITH PARTIAL MEDIAL MENISECTOMY;  Surgeon: Carole Civil, MD;  [facility];  Service: Orthopedics;  Laterality: Right;   LEFT AND RIGHT HEART CATHETERIZATION WITH CORONARY ANGIOGRAM N/A 07/31/2014   Procedure: LEFT AND RIGHT HEART CATHETERIZATION WITH CORONARY ANGIOGRAM;  Surgeon: Burnell Blanks, MD;  Location: [facility] CATH LAB;  Service: Cardiovascular;  Laterality: N/A;   MALONEY DILATION N/A 09/27/2016   Procedure: Venia Minks DILATION;  Surgeon: Daneil Dolin, MD;  [facility];  Service: Endoscopy;  Laterality: N/A;   POLYPECTOMY  09/27/2016   Procedure: POLYPECTOMY;  Surgeon: Daneil Dolin, MD;  [facility];  Service: Endoscopy;;  colon   ROTATOR CUFF REPAIR Bilateral    TEE WITHOUT CARDIOVERSION N/A 07/07/2014   Procedure: TRANSESOPHAGEAL ECHOCARDIOGRAM (TEE);  Surgeon: Arnoldo Lenis, MD;  [facility];  Service: Cardiology;  Laterality: N/A;   TEE WITHOUT CARDIOVERSION N/A 07/20/2014   Procedure: TRANSESOPHAGEAL ECHOCARDIOGRAM (TEE) WITH PROPOFOL;  Surgeon: Arnoldo Lenis, MD;  [facility]: [facility];  Service: Endoscopy;  Laterality: N/A;   TEE WITHOUT CARDIOVERSION N/A 10/07/2014   Procedure: TRANSESOPHAGEAL ECHOCARDIOGRAM (TEE);  Surgeon: Rexene Alberts, MD;  Location: [place];  Service: Open Heart Surgery;  Laterality: N/A;    Allergies: Iodinated contrast media and Sulfa antibiotics  Medications: Prior to Admission medications   Medication Sig Start Date End Date Taking? Authorizing Provider  acetaminophen (TYLENOL) 325  MG tablet Take 650 mg by mouth every 6 (six) hours as needed for mild pain, moderate pain, fever or headache.    [provider]  aspirin EC 81 MG tablet Take 1 tablet (81 mg total) by mouth daily. Swallow whole. Patient taking differently: Take 81 mg by mouth in the morning. 12/16/19   Arnoldo Lenis, MD  Cholecalciferol (VITAMIN D-3 PO) Take 1 capsule by mouth in the morning.    [provider]  clopidogrel (PLAVIX) 75 MG tablet Take 1 tablet (75 mg total) by mouth daily. 05/03/22   Monia Sabal, PA-C  clopidogrel (PLAVIX) 75 MG tablet Take 1 tablet (75 mg total) by mouth daily. 05/03/22   Monia Sabal, PA-C  diclofenac (VOLTAREN) 75 MG EC tablet TAKE 1 TABLET TWICE A DAY Patient taking  differently: Take 75 mg by mouth 2 (two) times daily. 03/03/21   Carole Civil, MD  dicyclomine (BENTYL) 10 MG capsule Take 1 capsule (10 mg total) by mouth every 8 (eight) hours as needed for spasms. For left-sided discomfort and frequent stools. Watch for constipation, dry mouth, dizziness. 12/06/21   Annitta Needs, NP  estradiol (ESTRACE) 2 MG tablet Take 1 tablet (2 mg total) by mouth daily. Patient taking differently: Take 2 mg by mouth in the morning. 01/19/21   Wendie Agreste, MD  ezetimibe (ZETIA) 10 MG tablet Take 1 tablet (10 mg total) by mouth daily. Patient taking differently: Take 10 mg by mouth in the morning. 01/13/22 01/08/23  Dunn, Lisbeth Renshaw N, PA-C  Flaxseed, Linseed, (FLAX SEED OIL PO) Take 1 capsule by mouth in the morning.    [provider]  HYDROcodone-acetaminophen (NORCO/VICODIN) 5-325 MG tablet Take 1 tablet by mouth every 6 (six) hours as needed for moderate pain. 04/05/22   Wendie Agreste, MD  hydrocortisone (ANUSOL-HC) 2.5 % rectal cream Place 1 Application rectally 2 (two) times daily. As needed for rectal bleeding. Patient taking differently: Place 1 Application rectally 2 (two) times daily as needed for hemorrhoids (rectal bleeding). 12/06/21   Annitta Needs, NP  losartan (COZAAR) 25 MG tablet TAKE 1 TABLET BY MOUTH DAILY 04/13/22   Wendie Agreste, MD  metoprolol tartrate (LOPRESSOR) 25 MG tablet TAKE 1 TABLET TWICE A DAY 02/24/22   Arnoldo Lenis, MD  Multiple Minerals (CALCIUM-MAGNESIUM-ZINC) TABS Take 1 tablet by mouth in the morning.    [provider]  Multiple Vitamin (MULTIVITAMIN WITH MINERALS) TABS tablet Take 1 tablet by mouth in the morning.    [provider]  NP THYROID 120 MG tablet Take 120 mg by mouth daily before breakfast. 01/11/22   [provider]  progesterone (PROMETRIUM) 200 MG capsule Take 2 capsules (400 mg total) by mouth daily. Patient taking differently: Take 400 mg by mouth at bedtime. 01/19/21  04/29/22  Wendie Agreste, MD  RABEprazole (ACIPHEX) 20 MG tablet Take 1 tablet (20 mg total) by mouth 2 (two) times daily before a meal. 02/27/22   Mahon, Lenise Arena, NP  rosuvastatin (CRESTOR) 40 MG tablet TAKE 1 TABLET DAILY Patient taking differently: Take 40 mg by mouth at bedtime. 01/23/22   Arnoldo Lenis, MD  triamcinolone ointment (KENALOG) 0.1 % Apply 1 Application topically 2 (two) times daily as needed. Patient taking differently: Apply 1 Application topically 2 (two) times daily as needed (irritation). 11/25/21   Valentina Shaggy, MD     Family History  Problem Relation Age of Onset   Hypertension Mother    Hypertension  Father    Heart disease Father        before age 66   Other Father        varicose veins   COPD Father    Colon cancer Neg Hx    Celiac disease Neg Hx    Inflammatory bowel disease Neg Hx     Social History   Socioeconomic History   Marital status: Married    Spouse name: Not on file   Number of children: 0   Years of education: some coll   Highest education level: Not on file  Occupational History   Occupation: call center    Employer: [employer]    Comment: [employer]  Tobacco Use   Smoking status: Former    Packs/day: 0.50    Years: 40.00    Total pack years: 20.00    Types: Cigarettes    Quit date: 05/2021    Years since quitting: 0.9   Smokeless tobacco: Never  Vaping Use   Vaping Use: Never used  Substance and Sexual Activity   Alcohol use: Yes    Alcohol/week: 2.0 standard drinks of alcohol    Types: 2 Cans of beer per week   Drug use: No   Sexual activity: Yes    Birth control/protection: None  Other Topics Concern   Not on file  Social History Narrative   Patient is married   for 5 years . Patient works at [employer].. Some college education-did not Writer.Originally from [place].   Social Determinants of Health   Financial Resource Strain: Not on file  Food Insecurity: No Food Insecurity (03/22/2022)    Hunger Vital Sign    Worried About Running Out of Food in the Last Year: Never true    Ran Out of Food in the Last Year: Never true  Transportation Needs: No Transportation Needs (03/22/2022)   PRAPARE - Hydrologist (Medical): No    Lack of Transportation (Non-Medical): No  Physical Activity: Not on file  Stress: Not on file  Social Connections: Not on file     Vital Signs: There were no vitals taken for this visit.  Physical Exam  Imaging: ABI (06/03/21--> 11/25/21) R = 1.11 --> 1.07 L = 0.93 --> 1.02     CTA AP (06/09/21)  Interval occlusion of SMA stent, with proximal native reconstitution.  Prominent Arc of [place].  Unchanged mild/moderate celiac stenosis, widely patent IMA.     CTA AP (07/15/21)  Recanalized and relined SMA stent, widely patent.   Angiogram (07/12/21)  Mild proximal left EIA stenosis and mild in-stent restenosis.     10/11/21  Patent SMA stent   US Carotid 01/13/22 IMPRESSION: Color duplex indicates minimal heterogeneous plaque, with no hemodynamically significant stenosis by duplex criteria in the extracranial cerebrovascular circulation.    [place] Mesenteric 01/13/22 MPRESSION: Directed duplex of the mesenteric vasculature demonstrates patent stent with questionable mid stent stenosis versus artifact.  Celiac artery not evaluated given the poor sonographic window.  [place] Mesenteric 04/21/22 IMPRESSION: 1. Elevated peak systolic velocity in the proximal and mid aspect of the SMA may reflect turbulence related to the presence of the stent. 2. The stent remains patent. 3. Celiac axis is largely obscured by bowel gas.    Labs:  CBC: Recent Labs    03/20/22 0421 03/21/22 0452 03/27/22 1126 04/05/22 1205  WBC 11.5* 10.6* 12.2* 10.7*  HGB 9.8* 10.0* 11.2* 11.4*  HCT 28.4* 30.2* 33.4* 33.2*  PLT 379 471*  622.0* 535.0*    COAGS: Recent Labs    07/12/21 1107 03/14/22 2020  INR 1.0 1.1    BMP: Recent  Labs    03/18/22 0733 03/19/22 0420 03/20/22 0421 03/21/22 0651 03/27/22 1126  NA 135 135 133* 136 138  K 3.4* 3.7 3.1* 3.5 3.6  CL 102 103 101 101 100  CO2 21* '23 23 24 29  '$ GLUCOSE 93 85 114* 158* 94  BUN <5* <5* <5* <5* 10  CALCIUM 8.1* 7.9* 7.9* 8.4* 8.9  CREATININE 0.82 0.87 0.72 0.70 0.91  GFRNONAA >60 >60 >60 >60  --     LIVER FUNCTION TESTS: Recent Labs    10/11/21 1701 02/20/22 1626 03/14/22 2020 03/15/22 0605  BILITOT 0.2* 0.3 0.6 0.4  AST 20 16 14* 14*  ALT '16 13 15 14  '$ ALKPHOS 43 70 78 68  PROT 5.6* 7.0 7.8 6.5  ALBUMIN 3.0* 4.1 3.4* 2.8*    Assessment and Plan: 67 year old female with history of atherosclerotic disease including peripheral artery disease and chronic mesenteric ischemia now status post superior mesenteric artery covered stent placement on 10/29/20 and stent recanalization with re-lining on 07/12/21.   She was hospitalized in late November 2023 for acute appendicitis with thrombosed SMA stent.  She underwent mesenteric angiogram (CO2 + IVUS) and SMA thrombectomy with DCB angioplasty of the previously placed stents on 03/21/22.   Recent mesenteric duplex demonstrates patency of the stents and SMA.  Her pain is improved, now limited only to the RLQ associated with her appendicitis.  Hopeful for appendectomy soon.  Follow up in 3 months with repeat mesenteric duplex.   Electronically Signed: Suzette Battiest 05/11/2022, 2:26 PM   I spent a total of 25 Minutes in virtual telephone clinical consultation, greater than 50% of which was counseling/coordinating care for chronic mesenteric ischemia.

## 2022-05-19 ENCOUNTER — Other Ambulatory Visit: Payer: Self-pay | Admitting: General Surgery

## 2022-05-19 DIAGNOSIS — K3532 Acute appendicitis with perforation and localized peritonitis, without abscess: Secondary | ICD-10-CM

## 2022-05-25 ENCOUNTER — Other Ambulatory Visit (HOSPITAL_COMMUNITY): Payer: Self-pay | Admitting: General Surgery

## 2022-05-25 DIAGNOSIS — K3532 Acute appendicitis with perforation and localized peritonitis, without abscess: Secondary | ICD-10-CM

## 2022-06-01 ENCOUNTER — Telehealth (INDEPENDENT_AMBULATORY_CARE_PROVIDER_SITE_OTHER): Payer: Self-pay | Admitting: Gastroenterology

## 2022-06-01 NOTE — Telephone Encounter (Signed)
Patient came in the office today with her husband - he is a patient of Dr Jenetta Downer - patient stated she had been seen at [facility] on [facility] - wanted to make an appointment with Dr Jenetta Downer - patient was very adamant about not going back to [facility] location - didn't want to see any providers there

## 2022-06-08 ENCOUNTER — Ambulatory Visit: Payer: BC Managed Care – PPO | Admitting: Cardiology

## 2022-06-08 ENCOUNTER — Ambulatory Visit (INDEPENDENT_AMBULATORY_CARE_PROVIDER_SITE_OTHER): Payer: Medicare Other

## 2022-06-08 ENCOUNTER — Encounter: Payer: Self-pay | Admitting: Orthopedic Surgery

## 2022-06-08 ENCOUNTER — Ambulatory Visit: Payer: Medicare Other | Attending: Cardiology | Admitting: Cardiology

## 2022-06-08 ENCOUNTER — Ambulatory Visit: Payer: Medicare Other | Admitting: Orthopedic Surgery

## 2022-06-08 VITALS — BP 164/90 | HR 88 | Ht 64.0 in | Wt 215.0 lb

## 2022-06-08 VITALS — BP 128/80 | HR 85 | Ht 64.0 in | Wt 213.0 lb

## 2022-06-08 DIAGNOSIS — M189 Osteoarthritis of first carpometacarpal joint, unspecified: Secondary | ICD-10-CM | POA: Diagnosis not present

## 2022-06-08 DIAGNOSIS — Z952 Presence of prosthetic heart valve: Secondary | ICD-10-CM | POA: Diagnosis not present

## 2022-06-08 DIAGNOSIS — E782 Mixed hyperlipidemia: Secondary | ICD-10-CM

## 2022-06-08 DIAGNOSIS — I10 Essential (primary) hypertension: Secondary | ICD-10-CM | POA: Diagnosis not present

## 2022-06-08 DIAGNOSIS — M25532 Pain in left wrist: Secondary | ICD-10-CM

## 2022-06-08 NOTE — Patient Instructions (Signed)
Referral has been sent to DrGramig his office will call you [phone] or [phone] is the phone number if you want to call them to schedule (it may be quicker)

## 2022-06-08 NOTE — Progress Notes (Signed)
Chief Complaint  Patient presents with   Wrist Pain    Left states fracture years ago was set incorrectly has seen hand surgeon who recommended surgery but she declined    This is a 70 female who had some type of fracture of her wrist back in 2018 was seen by Dr. Fredna Dow with an MRI there was some type of surgery discussed but I do not have the notes from that visit  She presents now with pain in her left thumb difficulty opening and twisting Difficulty Pinching and Pinching Activities Are Difficult As Well.  She Takes Tylenol Which Has Been Unsuccessful in Relieving Her Pain but She Is Also on Plavix This Limits Her Ability to Take Anti-Inflammatories  Examination of the Left Thumb Reveals Tenderness at the [facility] Joint Joint Subluxation Crepitance Weak Pinch Strength without Neurovascular Deficit.  Appears to Have No Problems with the First Extensor Compartment  Imaging Shows [facility] Arthritis  The Image from 2018 Which Was an MRI Shows Arthritis of the [place] As Well  I Offered Her Splint through the OT Department but That Will Be Temporary Relief If Any  She Would like to Go Ahead and See the Hand Surgeon for Evaluation for Possible Arthroplasty

## 2022-06-08 NOTE — Patient Instructions (Signed)
Medication Instructions:  Your physician recommends that you continue on your current medications as directed. Please refer to the Current Medication list given to you today.   Labwork: None today  Testing/Procedures: None today  Follow-Up: 6 months  Any Other Special Instructions Will Be Listed Below (If Applicable).  If you need a refill on your cardiac medications before your next appointment, please call your pharmacy.  

## 2022-06-08 NOTE — Progress Notes (Signed)
Clinical Summary Ms. Kapela is a 67 y.o.female seen today for follow up of the following medicla problems   1. Aortic stenosis -  hx in 1968 at age of 76 of supravavlular aortic stenosis, corrective surgery with patching at that time.   - found to have developed severe aortic valvular stenosis with possible recurrence of supravalvular stenosis as well based on imaging. 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   - Medtronic Freestyle stentless porcine valve/aortic root graft (size 21 mm, model # 995, serial # [id number])       09/2017 echo LVEF 65-70%, no WMAS, grade I diastolic dysfunction, normal AVR.  -12/2021 echo: LVEF 65-70%, 21 mm Medtronic Freestyle stentless porcine valve. Peak and  mean gradients through the valve are 13 and 5 mm Hg respectively.. The  aortic valve has been repaired/replaced. Aortic valve regurgitation is not  visualized.   - no recent symptoms       2. PAD - followed by vascular, history of left external iliac stent.        3. HTN - she is compliant iwht meds  - cough on ACE-I, discontinued - reported some insomina initialyl with losartan but unclear if truly related     - compliant with meds   4. 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  - she is on ASA/plavix per IR     6. OSA - moderate OSA by 08/2020 - cpap machine was expensive.    SH: Her husband Raschie Mccormac is also a patient of mine. 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      Trip to [place] to visit her mother. Born and raised in [place] to [place]. Father passed in February.  Enjoys  riding motorcycles     Past Medical History:  Diagnosis Date   Aortic regurgitation 07/20/2014   Aortic stenosis, severe 07/20/2014   Arthritis    DVT (deep venous thrombosis) (HCC)    Family history of adverse reaction to anesthesia    Reports father deliurm in his 22's with CABG   GERD (gastroesophageal reflux disease)    History of pneumonia    Hyperlipidemia    Hypertension    Insomnia, unspecified    Muscle weakness (generalized)    Peripheral artery disease (HCC)    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   Redo sternotomy for 21 mm Medtronic Freestyle porcine aortic root graft w/ reimplantation of left main and right coronary arteries   Sleep apnea    diagnosed multiple years ago at [facility] aortic stenosis, congenital - s/p repair during childhood      Allergies  Allergen Reactions   Iodinated Contrast Media Anaphylaxis, Shortness Of Breath, Itching, Swelling and Other (See Comments)    Patient was given Omni 350 and suffered from itching, chest pain and shortness of breath. Patient was taken to ED after onset of reaction. 10/11/2021  MD zackowski noted pt had tongue and throat swelling, had to give pt epinephrine    Sulfa Antibiotics Nausea  And Vomiting     Current Outpatient Medications  Medication Sig Dispense Refill   acetaminophen (TYLENOL) 325 MG tablet Take 650 mg by mouth every 6 (six) hours as needed for mild pain, moderate pain, fever or headache.     aspirin EC 81 MG tablet Take 1 tablet (81 mg total) by mouth daily. Swallow whole. (Patient taking differently: Take 81 mg by mouth in the morning.) 90 tablet 3   Cholecalciferol (VITAMIN D-3 PO) Take 1 capsule by mouth in the morning.     clopidogrel (PLAVIX) 75 MG tablet Take 1 tablet (75 mg total) by mouth daily. 90 tablet 5   clopidogrel (PLAVIX) 75 MG tablet Take 1 tablet (75 mg total) by mouth daily. 90 tablet 5   diclofenac (VOLTAREN) 75 MG EC  tablet TAKE 1 TABLET TWICE A DAY (Patient taking differently: Take 75 mg by mouth 2 (two) times daily.) 60 tablet 11   dicyclomine (BENTYL) 10 MG capsule Take 1 capsule (10 mg total) by mouth every 8 (eight) hours as needed for spasms. For left-sided discomfort and frequent stools. Watch for constipation, dry mouth, dizziness. 90 capsule 1   estradiol (ESTRACE) 2 MG tablet Take 1 tablet (2 mg total) by mouth daily. (Patient taking differently: Take 2 mg by mouth in the morning.) 30 tablet 3   ezetimibe (ZETIA) 10 MG tablet Take 1 tablet (10 mg total) by mouth daily. (Patient taking differently: Take 10 mg by mouth in the morning.) 90 tablet 3   Flaxseed, Linseed, (FLAX SEED OIL PO) Take 1 capsule by mouth in the morning.     HYDROcodone-acetaminophen (NORCO/VICODIN) 5-325 MG tablet Take 1 tablet by mouth every 6 (six) hours as needed for moderate pain. 20 tablet 0   hydrocortisone (ANUSOL-HC) 2.5 % rectal cream Place 1 Application rectally 2 (two) times daily. As needed for rectal bleeding. (Patient taking differently: Place 1 Application rectally 2 (two) times daily as needed for hemorrhoids (rectal bleeding).) 30 g 1   losartan (COZAAR) 25 MG tablet TAKE 1 TABLET BY MOUTH DAILY 90 tablet 3   metoprolol tartrate (LOPRESSOR) 25 MG tablet TAKE 1 TABLET TWICE A DAY 180 tablet 3   Multiple Minerals (CALCIUM-MAGNESIUM-ZINC) TABS Take 1 tablet by mouth in the morning.     Multiple Vitamin (MULTIVITAMIN WITH MINERALS) TABS tablet Take 1 tablet by mouth in the morning.     NP THYROID 120 MG tablet Take 120 mg by mouth daily before breakfast.     progesterone (PROMETRIUM) 200 MG capsule Take 2 capsules (400 mg total) by mouth daily. (Patient taking differently: Take 400 mg by mouth at bedtime.) 180 capsule 1   RABEprazole (ACIPHEX) 20 MG tablet Take 1 tablet (20 mg total) by mouth 2 (two) times daily before a meal. 180 tablet 3   rosuvastatin (CRESTOR) 40 MG tablet TAKE 1 TABLET DAILY (Patient taking  differently: Take 40 mg by mouth at bedtime.) 90 tablet 3   triamcinolone ointment (KENALOG) 0.1 % Apply 1 Application topically 2 (two) times daily as needed. (Patient taking differently: Apply 1 Application topically 2 (two) times daily as needed (irritation).) 454 g 0   No current facility-administered medications for this visit.     Past Surgical History:  Procedure Laterality Date   ABDOMINAL AORTAGRAM  06/24/2012   ABDOMINAL AORTAGRAM N/A 06/24/2012   Procedure: ABDOMINAL Maxcine Ham;  Surgeon: Angelia Mould, MD;  Location: [facility] CATH LAB;  Service: Cardiovascular;  Laterality: N/A;   AORTIC VALVE REPLACEMENT N/A 10/07/2014  Procedure: REDO AORTIC VALVE REPLACEMENT (AVR);  Surgeon: Rexene Alberts, MD;  Location: [place];  Service: Open Heart Surgery;  Laterality: N/A;   ASCENDING AORTIC ROOT REPLACEMENT N/A 10/07/2014   Procedure: ASCENDING AORTIC ROOT REPLACEMENT;  Surgeon: Rexene Alberts, MD;  Location: [place];  Service: Open Heart Surgery;  Laterality: N/A;   BIOPSY  09/27/2016   Procedure: BIOPSY;  Surgeon: Daneil Dolin, MD;  [facility];  Service: Endoscopy;;  colon   BIOPSY  10/18/2020   Procedure: BIOPSY;  Surgeon: Eloise Harman, DO;  [facility];  Service: Endoscopy;;   BREAST REDUCTION SURGERY Bilateral 01/21/2018   Procedure: BREAST REDUCTION WITH LIPOSUCTION;  Surgeon: Cristine Polio, MD;  Location: [place];  Service: Plastics;  Laterality: Bilateral;   CARDIAC VALVE SURGERY  1968   CARPAL TUNNEL RELEASE Right 03/02/2020   Procedure: CARPAL TUNNEL RELEASE;  Surgeon: Carole Civil, MD;  [facility]: [facility];  Service: Orthopedics;  Laterality: Right;   CATARACT EXTRACTION W/PHACO Left 05/30/2019   Procedure: CATARACT EXTRACTION PHACO AND INTRAOCULAR LENS PLACEMENT (IOC) (CDE: 4.94  );  Surgeon: Baruch Goldmann, MD;  Location: [facility];  Service: Ophthalmology;  Laterality: Left;   CATARACT EXTRACTION W/PHACO Right  06/16/2019   Procedure: CATARACT EXTRACTION PHACO AND INTRAOCULAR LENS PLACEMENT (IOC);  Surgeon: Baruch Goldmann, MD;  [facility];  Service: Ophthalmology;  Laterality: Right;  CDE: 4.64   CERVICAL FUSION     CHOLECYSTECTOMY     COLONOSCOPY WITH PROPOFOL N/A 09/27/2016   Dr. Gala Romney: Diverticulosis, several tubular adenomas removed ranging 4 to 7 mm in size, internal grade 1 hemorrhoids, terminal ileum normal, segmental biopsies negative for microscopic colitis.  Next colonoscopy June 2021   COLONOSCOPY WITH PROPOFOL N/A 05/06/2020   internal hemorrhoids, sigmoid diverticulosis. Surveillance colonoscopy due in 2027   ESOPHAGOGASTRODUODENOSCOPY (EGD) WITH PROPOFOL N/A 09/27/2016   Dr. Gala Romney: Small hiatal hernia, mild Schatzki ring status post disruption, LA grade a esophagitis   ESOPHAGOGASTRODUODENOSCOPY (EGD) WITH PROPOFOL N/A 10/18/2020   non-obstructing mild Schatzki ring, gastritis s/ biopsy. Negative H.pylori.   ILIAC ARTERY STENT Left 12/2007   IR ANGIOGRAM EXTREMITY BILATERAL  07/12/2021   IR ANGIOGRAM VISCERAL SELECTIVE  10/29/2020   IR ANGIOGRAM VISCERAL SELECTIVE  07/12/2021   IR ANGIOGRAM VISCERAL SELECTIVE  03/21/2022   IR AORTAGRAM ABDOMINAL SERIALOGRAM  03/21/2022   IR IVUS EACH ADDITIONAL NON CORONARY VESSEL  07/12/2021   IR IVUS EACH ADDITIONAL NON CORONARY VESSEL  03/21/2022   IR PTA NON CORO-LOWER EXTREM  03/21/2022   IR RADIOLOGIST EVAL & MGMT  10/26/2020   IR RADIOLOGIST EVAL & MGMT  11/10/2020   IR RADIOLOGIST EVAL & MGMT  02/09/2021   IR RADIOLOGIST EVAL & MGMT  06/16/2021   IR RADIOLOGIST EVAL & MGMT  08/15/2021   IR RADIOLOGIST EVAL & MGMT  10/18/2021   IR RADIOLOGIST EVAL & MGMT  01/11/2022   IR RADIOLOGIST EVAL & MGMT  03/02/2022   IR RADIOLOGIST EVAL & MGMT  05/11/2022   IR THROMBECT PRIM MECH INIT (INCLU) MOD SED  03/21/2022   IR THROMBECT SEC MECH MOD SED  07/12/2021   IR TRANSCATH PLC STENT 1ST ART NOT LE CV CAR VERT CAR  10/29/2020   IR TRANSCATH PLC STENT  1ST ART NOT LE CV CAR VERT CAR  07/12/2021   IR US GUIDE VASC ACCESS RIGHT  10/29/2020   IR US GUIDE VASC ACCESS RIGHT  07/12/2021   IR US GUIDE VASC  ACCESS RIGHT  03/21/2022   KNEE ARTHROSCOPY WITH MEDIAL MENISECTOMY Right 01/10/2018   Procedure: RIGHT KNEE ARTHROSCOPY WITH PARTIAL MEDIAL MENISECTOMY;  Surgeon: Carole Civil, MD;  [facility];  Service: Orthopedics;  Laterality: Right;   LEFT AND RIGHT HEART CATHETERIZATION WITH CORONARY ANGIOGRAM N/A 07/31/2014   Procedure: LEFT AND RIGHT HEART CATHETERIZATION WITH CORONARY ANGIOGRAM;  Surgeon: Burnell Blanks, MD;  Location: [facility] CATH LAB;  Service: Cardiovascular;  Laterality: N/A;   MALONEY DILATION N/A 09/27/2016   Procedure: Venia Minks DILATION;  Surgeon: Daneil Dolin, MD;  [facility];  Service: Endoscopy;  Laterality: N/A;   POLYPECTOMY  09/27/2016   Procedure: POLYPECTOMY;  Surgeon: Daneil Dolin, MD;  [facility];  Service: Endoscopy;;  colon   ROTATOR CUFF REPAIR Bilateral    TEE WITHOUT CARDIOVERSION N/A 07/07/2014   Procedure: TRANSESOPHAGEAL ECHOCARDIOGRAM (TEE);  Surgeon: Arnoldo Lenis, MD;  [facility];  Service: Cardiology;  Laterality: N/A;   TEE WITHOUT CARDIOVERSION N/A 07/20/2014   Procedure: TRANSESOPHAGEAL ECHOCARDIOGRAM (TEE) WITH PROPOFOL;  Surgeon: Arnoldo Lenis, MD;  [facility];  Service: Endoscopy;  Laterality: N/A;   TEE WITHOUT CARDIOVERSION N/A 10/07/2014   Procedure: TRANSESOPHAGEAL ECHOCARDIOGRAM (TEE);  Surgeon: Rexene Alberts, MD;  Location: [place];  Service: Open Heart Surgery;  Laterality: N/A;     Allergies  Allergen Reactions   Iodinated Contrast Media Anaphylaxis, Shortness Of Breath, Itching, Swelling and Other (See Comments)    Patient was given Omni 350 and suffered from itching, chest pain and shortness of breath. Patient was taken to ED after onset of reaction. 10/11/2021  MD zackowski noted pt had tongue and throat swelling, had  to give pt epinephrine    Sulfa Antibiotics Nausea And Vomiting      Family History  Problem Relation Age of Onset   Hypertension Mother    Hypertension Father    Heart disease Father        before age 67   Other Father        varicose veins   COPD Father    Colon cancer Neg Hx    Celiac disease Neg Hx    Inflammatory bowel disease Neg Hx      Social History Ms. Rieves reports that she quit smoking about a year ago. Her smoking use included cigarettes. She has a 20.00 pack-year smoking history. She has never used smokeless tobacco. Ms. Koster reports current alcohol use of about 2.0 standard drinks of alcohol per week.   Review of Systems CONSTITUTIONAL: No weight loss, fever, chills, weakness or fatigue.  HEENT: Eyes: No visual loss, blurred vision, double vision or yellow sclerae.No hearing loss, sneezing, congestion, runny nose or sore throat.  SKIN: No rash or itching.  CARDIOVASCULAR: per hpi RESPIRATORY: No shortness of breath, cough or sputum.  GASTROINTESTINAL: No anorexia, nausea, vomiting or diarrhea. No abdominal pain or blood.  GENITOURINARY: No burning on urination, no polyuria NEUROLOGICAL: No headache, dizziness, syncope, paralysis, ataxia, numbness or tingling in the extremities. No change in bowel or bladder control.  MUSCULOSKELETAL: No muscle, back pain, joint pain or stiffness.  LYMPHATICS: No enlarged nodes. No history of splenectomy.  PSYCHIATRIC: No history of depression or anxiety.  ENDOCRINOLOGIC: No reports of sweating, cold or heat intolerance. No polyuria or polydipsia.  [name]   Physical Examination Today's Vitals   06/08/22 1541  BP: 128/80  Pulse: 85  SpO2: 96%  Weight: 213 lb (96.6 kg)  Height: 5'  4" (1.626 m)   Body mass index is 36.56 kg/m.  Gen: resting comfortably, no acute distress HEENT: no scleral icterus, pupils equal round and reactive, no palptable cervical adenopathy,  CV: RRR, no m/r/g no jvd Resp: Clear to auscultation  bilaterally GI: abdomen is soft, non-tender, non-distended, normal bowel sounds, no hepatosplenomegaly MSK: extremities are warm, no edema.  Skin: warm, no rash Neuro:  no focal deficits Psych: appropriate affect   Diagnostic Studies  11/2015 echo Study Conclusions   - Left ventricle: The cavity size was normal. Wall thickness was   normal. Systolic function was vigorous. The estimated ejection   fraction was in the range of 65% to 70%. Wall motion was normal;   there were no regional wall motion abnormalities. Doppler   parameters are consistent with abnormal left ventricular   relaxation (grade 1 diastolic dysfunction). - Aortic valve: 21 mm Medtronic Freestyle stentless porcine   valve/aortic root graft in place. There was no significant   regurgitation. Peak gradient (S): 19 mm Hg. Valve area (Vmax):   1.83 cm^2. - Mitral valve: Calcified annulus. There was trivial regurgitation. - Right ventricle: The cavity size was mildly dilated. - Right atrium: Central venous pressure (est): 3 mm Hg. - Atrial septum: The septum bowed from right to left, consistent   with increased right atrial pressure. - Tricuspid valve: There was mild regurgitation. - Pulmonic valve: Peak gradient (S): 19 mm Hg. - Pulmonary arteries: PA peak pressure: 21 mm Hg (S). - Pericardium, extracardiac: There was no pericardial effusion.   Impressions:   - Normal LV wall thickness with LVEF 65-70%. Grade 1 diastolic   dysfunction. MAC with trivial mitral regurgitation. Bioprosthesis   in aortic position as described above with no significant   perivalvular regurgitation and peak gradient 19 mmHg. Mild RV   enlargement. Mild tricuspid regurgitation with PASP estimated 21   mmHg.    07/2014 cath Hemodynamic Findings: Ao:   107/55             LV: 190/12/15 RA:  1         RV:  41/2/6 PA:   26/6 (mean 15)     PCWP:  7 Fick Cardiac Output: 4.76 L/min Fick Cardiac Index: 2.38 L/min/m2 Central Aortic  Saturation: 96% Pulmonary Artery Saturation: 66%   Aortic valve data:  Peak to peak gradient 83 mm Hg Mean gradient 56 mmHg AVA 0.52 cm2   Angiographic Findings:   Left main: Normal caliber vessel with no obstructive disease.    Left Anterior Descending Artery: Large caliber vessel that courses to the apex. Moderate caliber diagonal Kacen Mellinger. No obstructive disease.    Circumflex Artery: Large caliber vessel with moderate caliber obtuse marginal Dale Strausser. No obstructive disease.    Right Coronary Artery: Large dominant vessel with no obstructive disease.    Left Ventricular Angiogram: LVEF=60%   Aortic root angiogram: The aortic root is not enlarged. There is supravalvular density.    Aortic valve calcification noted on plain fluoroscopy.    Impression: 1. No angiographic evidence of CAD 2. Normal LV systolic function 3. Severe gradient across the aortic valve into the aorta with density noted in the aortic root. Cannot exclude recurrent supravalvular stenosis along with aortic valve stenosis. 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  4. Normal filling pressures     09/2017 echo Study Conclusions   - Left ventricle: The cavity size was normal. Wall thickness was   normal. Systolic function was vigorous.  The estimated ejection   fraction was in the range of 65% to 70%. Wall motion was normal;   there were no regional wall motion abnormalities. Doppler   parameters are consistent with abnormal left ventricular   relaxation (grade 1 diastolic dysfunction). Doppler parameters   are consistent with indeterminate ventricular filling pressure. - Aortic valve: 21 mm Medtronic Freestyle stentless porcine   valve/aortic root graft in place. There was no significant   regurgitation. There was no stenosis. Peak gradient (S): 11 mm   Hg. - Mitral valve: Mildly calcified annulus. Normal thickness leaflets   . - Tricuspid valve: There was mild  regurgitation. - Pulmonary arteries: PA peak pressure: 35 mm Hg (S).   12/2021 carotid US IMPRESSION: Color duplex indicates minimal heterogeneous plaque, with no hemodynamically significant stenosis by duplex criteria in the extracranial cerebrovascular circulation.      12/2021 echo 1. Left ventricular ejection fraction, by estimation, is 65 to 70%. The  left ventricle has normal function. The left ventricle has no regional  wall motion abnormalities. Left ventricular diastolic parameters are  indeterminate.   2. Right ventricular systolic function is normal. The right ventricular  size is normal. There is mildly elevated pulmonary artery systolic  pressure.   3. Right atrial size was mildly dilated.   4. The mitral valve is normal in structure. Mild mitral valve  regurgitation.   5. S/p AVR. (21 mm Medtronic Freestyle stentless porcine valve. Peak and  mean gradients through the valve are 13 and 5 mm Hg respectively.. The  aortic valve has been repaired/replaced. Aortic valve regurgitation is not  visualized.   6. The inferior vena cava is normal in size with greater than 50%  respiratory variability, suggesting right atrial pressure of 3 mmHg.    Assessment and Plan  1. Aortic stenosis s/p AVR - s/p tissue AVR and aortic root graft placement - 12/2021 echo with normal functioning AVR - continue to monitor, continue ASA     2. HTN -at goal, continue current meds   3. Hyperlipidemia - at goal, continue current meds  F/u 6 months     Arnoldo Lenis, M.D.

## 2022-06-15 ENCOUNTER — Ambulatory Visit (HOSPITAL_COMMUNITY)
Admission: RE | Admit: 2022-06-15 | Discharge: 2022-06-15 | Disposition: A | Discharge status: Home / Self Care | Payer: Medicare Other | Source: Ambulatory Visit | Attending: General Surgery | Admitting: General Surgery

## 2022-06-15 DIAGNOSIS — K3532 Acute appendicitis with perforation and localized peritonitis, without abscess: Secondary | ICD-10-CM | POA: Diagnosis present

## 2022-06-19 ENCOUNTER — Ambulatory Visit: Payer: Self-pay | Admitting: General Surgery

## 2022-06-19 ENCOUNTER — Telehealth: Payer: Self-pay | Admitting: Cardiology

## 2022-06-19 NOTE — Telephone Encounter (Signed)
Hi Dr. Harl Bowie,  Ms. Dudzinski has an upcoming laparoscopic appendectomy planned (date to be determined). You recently saw her a couple of weeks ago on 06/08/2022 at which time it sounds like she was doing well from a cardiac standpoint. Can you please comment on her peri-operative risk since you just saw her? Is she okay to proceed with surgery?  Please route response back to P CV DIV PREOP.  Thank you so much! Joliet Mallozzi

## 2022-06-19 NOTE — Telephone Encounter (Signed)
   Pre-operative Risk Assessment    Patient Name: Michelle Patton  DOB: 31-Dec-1955 MRN: [id number]      Request for Surgical Clearance    Procedure:   Lap Appendectomy Surgery  Date of Surgery:  Clearance TBD                                 Surgeon:  Ralene Ok  Surgeon's Group or Practice Name:  [facility]   Phone number:  [phone] Fax number: [phone]   Type of Clearance Requested:   - Medical    Type of Anesthesia:  General    Additional requests/questions:    Sandrea Hammond   06/19/2022, 3:19 PM

## 2022-06-22 ENCOUNTER — Encounter: Payer: Self-pay | Admitting: Radiology

## 2022-06-22 NOTE — Telephone Encounter (Signed)
   Patient Name: Espn Whritenour  DOB: 06-Dec-1955 MRN: [id number]  Primary Cardiologist: Carlyle Dolly, MD  Chart reviewed as part of pre-operative protocol coverage. Pre-op clearance already addressed by colleagues in earlier phone notes. To summarize recommendations:  -Ok to proceed from cardiac standpoint, IR who placed the mesenteric stent would need to make the decision on plavix   -J Branch MD  Will route this bundled recommendation to requesting provider via Epic fax function and remove from pre-op pool. Please call with questions.  Elgie Collard, PA-C 06/22/2022, 5:35 PM

## 2022-06-22 NOTE — Telephone Encounter (Signed)
Ok to proceed from cardiac standpoint, IR who placed the mesenteric stent would need to make the decision on plavix  [name] MD

## 2022-06-23 ENCOUNTER — Encounter: Payer: Self-pay | Admitting: *Deleted

## 2022-07-05 ENCOUNTER — Telehealth: Payer: Self-pay | Admitting: Family Medicine

## 2022-07-05 NOTE — Telephone Encounter (Signed)
[place] to schedule their annual wellness visit. Welcome to Medicare visit Due by 01/23/2023.  Thank you,  [place] Direct dial  [phone]

## 2022-07-11 ENCOUNTER — Other Ambulatory Visit: Payer: Self-pay | Admitting: Interventional Radiology

## 2022-07-11 DIAGNOSIS — K551 Chronic vascular disorders of intestine: Secondary | ICD-10-CM

## 2022-07-17 ENCOUNTER — Telehealth (HOSPITAL_COMMUNITY): Payer: Self-pay | Admitting: Student

## 2022-07-17 ENCOUNTER — Encounter: Payer: Self-pay | Admitting: Family Medicine

## 2022-07-17 ENCOUNTER — Ambulatory Visit (INDEPENDENT_AMBULATORY_CARE_PROVIDER_SITE_OTHER): Payer: Medicare Other | Admitting: Family Medicine

## 2022-07-17 VITALS — BP 118/68 | HR 87 | Temp 98.4°F | Ht 64.0 in | Wt 224.0 lb

## 2022-07-17 DIAGNOSIS — Z780 Asymptomatic menopausal state: Secondary | ICD-10-CM

## 2022-07-17 DIAGNOSIS — K551 Chronic vascular disorders of intestine: Secondary | ICD-10-CM

## 2022-07-17 DIAGNOSIS — R1031 Right lower quadrant pain: Secondary | ICD-10-CM | POA: Diagnosis not present

## 2022-07-17 DIAGNOSIS — K3532 Acute appendicitis with perforation and localized peritonitis, without abscess: Secondary | ICD-10-CM | POA: Diagnosis not present

## 2022-07-17 DIAGNOSIS — E785 Hyperlipidemia, unspecified: Secondary | ICD-10-CM

## 2022-07-17 DIAGNOSIS — I739 Peripheral vascular disease, unspecified: Secondary | ICD-10-CM

## 2022-07-17 MED ORDER — CLOPIDOGREL BISULFATE 75 MG PO TABS: 75.0000 mg | ORAL_TABLET | Freq: Every day | ORAL | 5 refills | Status: DC

## 2022-07-17 NOTE — Patient Instructions (Signed)
Thanks for coming in today.  No med changes for now.  I did print some orders for metabolic panel and your cholesterol levels that can be drawn at [facility].  Let me know if they have any concerns or issues with that order.  Keep follow-up with your other specialists, but let me know if I can help in the meantime.  Good luck with the surgery in April. Take care!

## 2022-07-17 NOTE — Progress Notes (Unsigned)
Subjective:  Patient ID: Michelle Patton, female    DOB: 06/01/1955  Age: 67 y.o. MRN: [id number]  CC:  Chief Complaint  Patient presents with   follow up HLD, chronic conditions.   HPI Michelle Patton presents for   Hx of ruptured appendicitis  With delayed appendectomy planned April 11th.   Referred form Dr. Aline Brochure to Dr. Amedeo Plenty for left wrist pain.  Thank  Medication refills New patient establishing in September 2022.  Has been treated with NP thyroid, estradiol and progesterone by previous primary provider.  Transitioned to [facility] for hormone treatments.  When discussed in September of last year, she had been off NP thyroid, was continued on Estrace and progesterone, followed every 6 to 8 weeks by [facility]. Still followed by [facility].   Cardiac/hypertension Followed by cardiologist, Dr. Harl Bowie.  Moderate OSA but unable to tolerate CPAP.  History of CHF, PAD, hypertension, hyperlipidemia followed by cardiology.  Lasix as needed for lower extremity edema (not needed recently).  also followed by vascular with history of left external iliac stent for PAD.  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 Serafina Royals) with chronic mesenteric ischemia. Takes plavix daily. Gastroenterology: [facility] gastroenterology prior - will be seeing Dr. Jenetta Downer.  Hypertension treated with metoprolol, losartan. Hyperlipidemia treated with rosuvastatin and cardiology recommended adding ezetimibe last year.  Had not yet started her September visit. Has been taking both zetia and crestor.  Last ate on way here - 1 hr ago.  Lab Results  Component Value Date   CHOL 172 01/13/2022   HDL 66 01/13/2022   LDLCALC 75 01/13/2022   TRIG 154 (H) 01/13/2022   CHOLHDL 2.6 01/13/2022      History Patient Active Problem List   Diagnosis Date Noted   Leukocytosis 03/15/2022   Mixed hyperlipidemia 03/15/2022   Acquired hypothyroidism 03/15/2022   Hypoalbuminemia due to  protein-calorie malnutrition (Normal) 03/15/2022   Body mass index (BMI) 34.0-34.9, adult 03/15/2022   Acute perforated appendicitis 03/14/2022   Allergy, drug 01/13/2022   Melena 12/06/2021   Prolapsed internal hemorrhoids, grade 3 12/06/2021   Left lower quadrant abdominal pain 12/06/2021   Acute anaphylaxis 10/11/2021   Mesenteric ischemia ([place]) 07/12/2021   Chronic mesenteric ischemia ([place]) 02/22/2021   Elevated LFTs 10/05/2020   Class 2 severe obesity due to excess calories with serious comorbidity and body mass index (BMI) of 36.0 to 36.9 in adult [facility]) 08/09/2020   PAD (peripheral artery disease) ([place]) 08/09/2020   Aortic valve prosthesis present 0000000   Chronic systolic congestive heart failure ([place]) 08/09/2020   OSA and COPD overlap syndrome ([place]) 08/09/2020   s/p right carpal tunnel release 03/02/20 04/06/2020   Closed displaced fracture of proximal phalanx of lesser toe of right foot 12/31/2019   Carpal tunnel syndrome of right wrist 12/05/2019   Nondisplaced fracture of fifth right metatarsal bone with routine healing 10/21/2019   Skin lesion 04/11/2019   Low back pain without sciatica 01/20/2019   Pain of upper abdomen 01/14/2019   Fatigue 12/09/2018   Breast cancer screening 12/09/2018   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 left wrist 10/23/2018   S/P right knee arthroscopy 01/10/18 01/18/2018   Chondromalacia, patella, right    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    Personal history of colonic polyps 12/28/2016   Abdominal pain, epigastric 08/10/2016   Left sided abdominal pain 08/10/2016   Esophageal dysphagia 08/10/2016   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 10/07/2014   Supravalvular aortic stenosis, congenital - s/p repair during childhood    Essential hypertension    GERD (gastroesophageal reflux disease)    Aortic stenosis, severe 07/20/2014   Aortic regurgitation 07/20/2014   Leg  cramps 09/26/2012   Occlusion and stenosis of carotid artery without mention of cerebral infarction 07/10/2012   Peripheral vascular disease, unspecified ([place]) 06/12/2012   Carotid artery bruit 06/12/2012   Past Medical History:  Diagnosis Date   Aortic regurgitation 07/20/2014   Aortic stenosis, severe 07/20/2014   Arthritis    DVT (deep venous thrombosis) (HCC)    Family history of adverse reaction to anesthesia    Reports father deliurm in his 13's with CABG   GERD (gastroesophageal reflux disease)    History of pneumonia    Hyperlipidemia    Hypertension    Insomnia, unspecified    Muscle weakness (generalized)    Peripheral artery disease (HCC)    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   Redo sternotomy for 21 mm Medtronic Freestyle porcine aortic root graft w/ reimplantation of left main and right coronary arteries   Sleep apnea    diagnosed multiple years ago at [facility] aortic stenosis, congenital - s/p repair during childhood    Past Surgical History:  Procedure Laterality Date   ABDOMINAL AORTAGRAM  06/24/2012   ABDOMINAL AORTAGRAM N/A 06/24/2012   Procedure: ABDOMINAL Maxcine Ham;  Surgeon: Angelia Mould, MD;  Location: [facility] CATH LAB;  Service: Cardiovascular;  Laterality: N/A;   AORTIC VALVE REPLACEMENT N/A 10/07/2014   Procedure: REDO AORTIC VALVE REPLACEMENT (AVR);  Surgeon: Rexene Alberts, MD;  Location: [place];  Service: Open Heart Surgery;  Laterality: N/A;   ASCENDING AORTIC ROOT REPLACEMENT N/A 10/07/2014   Procedure: ASCENDING AORTIC ROOT REPLACEMENT;  Surgeon: Rexene Alberts, MD;  Location: [place];  Service: Open Heart Surgery;  Laterality: N/A;   BIOPSY  09/27/2016   Procedure: BIOPSY;  Surgeon: Daneil Dolin, MD;  [facility];  Service: Endoscopy;;  colon   BIOPSY  10/18/2020   Procedure: BIOPSY;  Surgeon: Eloise Harman, DO;  [facility];  Service: Endoscopy;;    BREAST REDUCTION SURGERY Bilateral 01/21/2018   Procedure: BREAST REDUCTION WITH LIPOSUCTION;  Surgeon: Cristine Polio, MD;  Location: [place];  Service: Plastics;  Laterality: Bilateral;   CARDIAC VALVE SURGERY  1968   CARPAL TUNNEL RELEASE Right 03/02/2020   Procedure: CARPAL TUNNEL RELEASE;  Surgeon: Carole Civil, MD;  [facility]: [facility];  Service: Orthopedics;  Laterality: Right;   CATARACT EXTRACTION W/PHACO Left 05/30/2019   Procedure: CATARACT EXTRACTION PHACO AND INTRAOCULAR LENS PLACEMENT (IOC) (CDE: 4.94  );  Surgeon: Baruch Goldmann, MD;  Location: [facility];  Service: Ophthalmology;  Laterality: Left;   CATARACT EXTRACTION W/PHACO Right 06/16/2019   Procedure: CATARACT EXTRACTION PHACO AND INTRAOCULAR LENS PLACEMENT (IOC);  Surgeon: Baruch Goldmann, MD;  [facility]: [facility];  Service: Ophthalmology;  Laterality: Right;  CDE: 4.64   CERVICAL FUSION     CHOLECYSTECTOMY     COLONOSCOPY WITH PROPOFOL N/A 09/27/2016   Dr. Gala Romney: Diverticulosis, several tubular adenomas removed ranging 4 to 7 mm in size, internal grade 1 hemorrhoids, terminal ileum normal, segmental biopsies negative for microscopic colitis.  Next colonoscopy June 2021   COLONOSCOPY WITH PROPOFOL N/A 05/06/2020   internal hemorrhoids, sigmoid diverticulosis. Surveillance colonoscopy due in 2027   ESOPHAGOGASTRODUODENOSCOPY (EGD) WITH PROPOFOL N/A 09/27/2016  Dr. Gala Romney: Small hiatal hernia, mild Schatzki ring status post disruption, LA grade a esophagitis   ESOPHAGOGASTRODUODENOSCOPY (EGD) WITH PROPOFOL N/A 10/18/2020   non-obstructing mild Schatzki ring, gastritis s/ biopsy. Negative H.pylori.   ILIAC ARTERY STENT Left 12/2007   IR ANGIOGRAM EXTREMITY BILATERAL  07/12/2021   IR ANGIOGRAM VISCERAL SELECTIVE  10/29/2020   IR ANGIOGRAM VISCERAL SELECTIVE  07/12/2021   IR ANGIOGRAM VISCERAL SELECTIVE  03/21/2022   IR AORTAGRAM ABDOMINAL SERIALOGRAM  03/21/2022   IR IVUS EACH ADDITIONAL NON CORONARY  VESSEL  07/12/2021   IR IVUS EACH ADDITIONAL NON CORONARY VESSEL  03/21/2022   IR PTA NON CORO-LOWER EXTREM  03/21/2022   IR RADIOLOGIST EVAL & MGMT  10/26/2020   IR RADIOLOGIST EVAL & MGMT  11/10/2020   IR RADIOLOGIST EVAL & MGMT  02/09/2021   IR RADIOLOGIST EVAL & MGMT  06/16/2021   IR RADIOLOGIST EVAL & MGMT  08/15/2021   IR RADIOLOGIST EVAL & MGMT  10/18/2021   IR RADIOLOGIST EVAL & MGMT  01/11/2022   IR RADIOLOGIST EVAL & MGMT  03/02/2022   IR RADIOLOGIST EVAL & MGMT  05/11/2022   IR THROMBECT PRIM MECH INIT (INCLU) MOD SED  03/21/2022   IR THROMBECT SEC MECH MOD SED  07/12/2021   IR TRANSCATH PLC STENT 1ST ART NOT LE CV CAR VERT CAR  10/29/2020   IR TRANSCATH PLC STENT 1ST ART NOT LE CV CAR VERT CAR  07/12/2021   IR US GUIDE VASC ACCESS RIGHT  10/29/2020   IR US GUIDE VASC ACCESS RIGHT  07/12/2021   IR US GUIDE VASC ACCESS RIGHT  03/21/2022   KNEE ARTHROSCOPY WITH MEDIAL MENISECTOMY Right 01/10/2018   Procedure: RIGHT KNEE ARTHROSCOPY WITH PARTIAL MEDIAL MENISECTOMY;  Surgeon: Carole Civil, MD;  [facility];  Service: Orthopedics;  Laterality: Right;   LEFT AND RIGHT HEART CATHETERIZATION WITH CORONARY ANGIOGRAM N/A 07/31/2014   Procedure: LEFT AND RIGHT HEART CATHETERIZATION WITH CORONARY ANGIOGRAM;  Surgeon: Burnell Blanks, MD;  Location: [facility] CATH LAB;  Service: Cardiovascular;  Laterality: N/A;   MALONEY DILATION N/A 09/27/2016   Procedure: Venia Minks DILATION;  Surgeon: Daneil Dolin, MD;  [facility];  Service: Endoscopy;  Laterality: N/A;   POLYPECTOMY  09/27/2016   Procedure: POLYPECTOMY;  Surgeon: Daneil Dolin, MD;  [facility];  Service: Endoscopy;;  colon   ROTATOR CUFF REPAIR Bilateral    TEE WITHOUT CARDIOVERSION N/A 07/07/2014   Procedure: TRANSESOPHAGEAL ECHOCARDIOGRAM (TEE);  Surgeon: Arnoldo Lenis, MD;  [facility];  Service: Cardiology;  Laterality: N/A;   TEE WITHOUT CARDIOVERSION N/A 07/20/2014   Procedure:  TRANSESOPHAGEAL ECHOCARDIOGRAM (TEE) WITH PROPOFOL;  Surgeon: Arnoldo Lenis, MD;  [facility];  Service: Endoscopy;  Laterality: N/A;   TEE WITHOUT CARDIOVERSION N/A 10/07/2014   Procedure: TRANSESOPHAGEAL ECHOCARDIOGRAM (TEE);  Surgeon: Rexene Alberts, MD;  Location: [place];  Service: Open Heart Surgery;  Laterality: N/A;   Allergies  Allergen Reactions   Iodinated Contrast Media Anaphylaxis, Shortness Of Breath, Itching, Swelling and Other (See Comments)    Patient was given Omni 350 and suffered from itching, chest pain and shortness of breath. Patient was taken to ED after onset of reaction. 10/11/2021  MD zackowski noted pt had tongue and throat swelling, had to give pt epinephrine    Sulfa Antibiotics Nausea And Vomiting   Prior to Admission medications   Medication Sig Start Date End Date Taking? Authorizing Provider  aspirin EC 81 MG  tablet Take 1 tablet (81 mg total) by mouth daily. Swallow whole. Patient taking differently: Take 81 mg by mouth in the morning. 12/16/19  Yes Branch, Alphonse Guild, MD  Cholecalciferol (VITAMIN D-3 PO) Take 1 capsule by mouth in the morning.   Yes [provider]  clopidogrel (PLAVIX) 75 MG tablet Take 1 tablet (75 mg total) by mouth daily. 05/03/22  Yes Monia Sabal, PA-C  dicyclomine (BENTYL) 10 MG capsule Take 1 capsule (10 mg total) by mouth every 8 (eight) hours as needed for spasms. For left-sided discomfort and frequent stools. Watch for constipation, dry mouth, dizziness. 12/06/21  Yes Annitta Needs, NP  estradiol (ESTRACE) 2 MG tablet Take 1 tablet (2 mg total) by mouth daily. Patient taking differently: Take 2 mg by mouth in the morning. 01/19/21  Yes Wendie Agreste, MD  ezetimibe (ZETIA) 10 MG tablet Take 1 tablet (10 mg total) by mouth daily. Patient taking differently: Take 10 mg by mouth in the morning. 01/13/22 01/08/23 Yes Dunn, Dayna N, PA-C  Flaxseed, Linseed, (FLAX SEED OIL PO) Take 1 capsule by mouth in the morning.   Yes  [provider]  hydrocortisone (ANUSOL-HC) 2.5 % rectal cream Place 1 Application rectally 2 (two) times daily. As needed for rectal bleeding. Patient taking differently: Place 1 Application rectally 2 (two) times daily as needed for hemorrhoids (rectal bleeding). 12/06/21  Yes Annitta Needs, NP  losartan (COZAAR) 25 MG tablet TAKE 1 TABLET BY MOUTH DAILY 04/13/22  Yes Wendie Agreste, MD  metoprolol tartrate (LOPRESSOR) 25 MG tablet TAKE 1 TABLET TWICE A DAY 02/24/22  Yes Branch, Alphonse Guild, MD  Multiple Minerals (CALCIUM-MAGNESIUM-ZINC) TABS Take 1 tablet by mouth in the morning.   Yes [provider]  Multiple Vitamin (MULTIVITAMIN WITH MINERALS) TABS tablet Take 1 tablet by mouth in the morning.   Yes [provider]  NP THYROID 120 MG tablet Take 60 mg by mouth daily before breakfast. 01/11/22  Yes [provider]  progesterone (PROMETRIUM) 200 MG capsule Take 2 capsules (400 mg total) by mouth daily. Patient taking differently: Take 400 mg by mouth at bedtime. 01/19/21 07/17/22 Yes Wendie Agreste, MD  RABEprazole (ACIPHEX) 20 MG tablet Take 1 tablet (20 mg total) by mouth 2 (two) times daily before a meal. 02/27/22  Yes Mahon, Courtney L, NP  rosuvastatin (CRESTOR) 40 MG tablet TAKE 1 TABLET DAILY Patient taking differently: Take 40 mg by mouth at bedtime. 01/23/22  Yes BranchAlphonse Guild, MD  triamcinolone ointment (KENALOG) 0.1 % Apply 1 Application topically 2 (two) times daily as needed. Patient taking differently: Apply 1 Application topically 2 (two) times daily as needed (irritation). 11/25/21  Yes Valentina Shaggy, MD  acetaminophen (TYLENOL) 325 MG tablet Take 650 mg by mouth every 6 (six) hours as needed for mild pain, moderate pain, fever or headache.    [provider]   Social History   Socioeconomic History   Marital status: Married    Spouse name: Not on file   Number of children: 0   Years of education: some coll   Highest  education level: Some college, no degree  Occupational History   Occupation: Programmer, applications: [employer]    Comment: [employer]  Tobacco Use   Smoking status: Former    Packs/day: 0.50    Years: 40.00    Additional pack years: 0.00    Total pack years: 20.00    Types: Cigarettes  Quit date: 05/2021    Years since quitting: 1.1   Smokeless tobacco: Never  Vaping Use   Vaping Use: Never used  Substance and Sexual Activity   Alcohol use: Yes    Alcohol/week: 2.0 standard drinks of alcohol    Types: 2 Cans of beer per week   Drug use: No   Sexual activity: Yes    Birth control/protection: None  Other Topics Concern   Not on file  Social History Narrative   Patient is married   for 5 years . Patient works at [employer].. Some college education-did not Writer.Originally from [place].   Social Determinants of Health   Financial Resource Strain: Low Risk  (07/13/2022)   Overall Financial Resource Strain (CARDIA)    Difficulty of Paying Living Expenses: Not hard at all  Food Insecurity: No Food Insecurity (07/13/2022)   Hunger Vital Sign    Worried About Running Out of Food in the Last Year: Never true    Ran Out of Food in the Last Year: Never true  Transportation Needs: No Transportation Needs (07/13/2022)   PRAPARE - Hydrologist (Medical): No    Lack of Transportation (Non-Medical): No  Physical Activity: Unknown (07/13/2022)   Exercise Vital Sign    Days of Exercise per Week: Patient declined    Minutes of Exercise per Session: Not on file  Stress: Stress Concern Present (07/13/2022)   [place]    Feeling of Stress : To some extent  Social Connections: Socially Isolated (07/13/2022)   Social Connection and Isolation Panel [NHANES]    Frequency of Communication with Friends and Family: Once a week    Frequency of Social Gatherings with Friends and Family: Once  a week    Attends Religious Services: Never    Marine scientist or Organizations: No    Attends Music therapist: Not on file    Marital Status: Married  Human resources officer Violence: Not At Risk (03/22/2022)   Humiliation, Afraid, Rape, and Kick questionnaire    Fear of Current or Ex-Partner: No    Emotionally Abused: No    Physically Abused: No    Sexually Abused: No    Review of Systems Per HPI.   Objective:   Vitals:   07/17/22 1554  BP: 118/68  Pulse: 87  Temp: 98.4 F (36.9 C)  TempSrc: Temporal  SpO2: 97%  Weight: 224 lb (101.6 kg)  Height: 5\' 4"  (1.626 m)     Physical Exam Vitals reviewed.  Constitutional:      Appearance: Normal appearance. She is well-developed.  HENT:     Head: Normocephalic and atraumatic.  Eyes:     Conjunctiva/sclera: Conjunctivae normal.     Pupils: Pupils are equal, round, and reactive to light.  Cardiovascular:     Rate and Rhythm: Normal rate and regular rhythm.     Heart sounds: Normal heart sounds.  Pulmonary:     Effort: Pulmonary effort is normal.     Breath sounds: Normal breath sounds.  Abdominal:     Palpations: Abdomen is soft. There is no pulsatile mass.     Tenderness: There is abdominal tenderness (Right-sided, some guarding, similar in past.).  Musculoskeletal:     Right lower leg: No edema.     Left lower leg: No edema.  Skin:    General: Skin is warm and dry.  Neurological:     Mental Status: She  is alert and oriented to person, place, and time.  Psychiatric:        Mood and Affect: Mood normal.        Behavior: Behavior normal.     Assessment & Plan:  Skyann Mutton is a 67 y.o. female . PAD (peripheral artery disease) (HCC) - Plan: Comprehensive metabolic panel, Lipid panel, clopidogrel (PLAVIX) 75 MG tablet  Chronic mesenteric ischemia (HCC) - Plan: clopidogrel (PLAVIX) 75 MG tablet  Acute perforated appendicitis  Right lower quadrant abdominal  pain  Menopause  Hyperlipidemia, unspecified hyperlipidemia type - Plan: Comprehensive metabolic panel, Lipid panel   Meds ordered this encounter  Medications   clopidogrel (PLAVIX) 75 MG tablet    Sig: Take 1 tablet (75 mg total) by mouth daily.    Dispense:  90 tablet    Refill:  5   Patient Instructions  Thanks for coming in today.  No med changes for now.  I did print some orders for metabolic panel and your cholesterol levels that can be drawn at [facility]  Let me know if they have any concerns or issues with that order.  Keep follow-up with your other specialists, but let me know if I can help in the meantime.  Good luck with the surgery in April. Take care!    Signed,   Merri Ray, MD [place] 07/17/22 4:49 PM

## 2022-07-17 NOTE — Telephone Encounter (Signed)
Plavix refill request received from [place]. Refill request approved and sent back to [facility] via fax. 75 mg Plavix, 1 tablet daily by mouth. Dispense #90 with 1 refill.   Soyla Dryer, Uncertain [phone] 07/17/2022, 1:52 PM

## 2022-07-18 ENCOUNTER — Other Ambulatory Visit: Payer: Self-pay | Admitting: Family Medicine

## 2022-07-19 LAB — LIPID PANEL
Chol/HDL Ratio: 2.6 ratio (ref 0.0–4.4)
Cholesterol, Total: 153 mg/dL (ref 100–199)
HDL: 59 mg/dL (ref >39)
LDL Chol Calc (NIH): 62 mg/dL (ref 0–99)
Triglycerides: 198 mg/dL — ABNORMAL HIGH (ref 0–149)
VLDL Cholesterol Cal: 32 mg/dL (ref 5–40)

## 2022-07-19 LAB — COMPREHENSIVE METABOLIC PANEL
ALT: 18 IU/L (ref 0–32)
AST: 19 IU/L (ref 0–40)
Albumin/Globulin Ratio: 1.4 (ref 1.2–2.2)
Albumin: 4.2 g/dL (ref 3.9–4.9)
Alkaline Phosphatase: 70 IU/L (ref 44–121)
BUN/Creatinine Ratio: 10 — ABNORMAL LOW (ref 12–28)
BUN: 10 mg/dL (ref 8–27)
Bilirubin Total: 0.3 mg/dL (ref 0.0–1.2)
CO2: 23 mmol/L (ref 20–29)
Calcium: 9.7 mg/dL (ref 8.7–10.3)
Chloride: 102 mmol/L (ref 96–106)
Creatinine, Ser: 1.04 mg/dL — ABNORMAL HIGH (ref 0.57–1.00)
Globulin, Total: 2.9 g/dL (ref 1.5–4.5)
Glucose: 104 mg/dL — ABNORMAL HIGH (ref 70–99)
Potassium: 4.5 mmol/L (ref 3.5–5.2)
Sodium: 140 mmol/L (ref 134–144)
Total Protein: 7.1 g/dL (ref 6.0–8.5)
eGFR: 60 mL/min/{1.73_m2} (ref >59)

## 2022-07-25 NOTE — Progress Notes (Signed)
Surgical Instructions    Your procedure is scheduled on Thursday April 11th.  Report to [facility] Main Entrance "A" at 8:30 A.M., then check in with the Admitting office.  Call this number if you have problems the morning of surgery:  [phone]   If you have any questions prior to your surgery date call [phone]: Open Monday-Friday 8am-4pm If you experience any cold or flu symptoms such as cough, fever, chills, shortness of breath, etc. between now and your scheduled surgery, please notify us at the above number     Remember:  Do not eat after midnight the night before your surgery  You may drink clear liquids until 7:30am the morning of your surgery.   Clear liquids allowed are: Water, Non-Citrus Juices (without pulp), Carbonated Beverages, Clear Tea, Black Coffee ONLY (NO MILK, CREAM OR POWDERED CREAMER of any kind), and Gatorade    Take these medicines the morning of surgery with A SIP OF WATER: estradiol (ESTRACE) 2 MG tablet  ezetimibe (ZETIA) 10 MG tablet  metoprolol tartrate (LOPRESSOR) 25 MG tablet  NP THYROID 120 MG tablet  RABEprazole (ACIPHEX) 20 MG tablet   IF NEEDED  acetaminophen (TYLENOL) 325 MG tablet  dicyclomine (BENTYL) 10 MG capsule   Follow your surgeon's instructions on when to stop Plavix and Aspirin.  If no instructions were given by your surgeon then you will need to call the office to get those instructions.     As of today, STOP taking any Aspirin (unless otherwise instructed by your surgeon) Aleve, Naproxen, Ibuprofen, Motrin, Advil, Goody's, BC's, all herbal medications, fish oil, and all vitamins.           Do not wear jewelry or makeup. Do not wear lotions, powders, perfumes or deodorant. Do not shave 48 hours prior to surgery.   Do not bring valuables to the hospital. Do not wear nail polish, gel polish, artificial nails, or any other type of covering on natural nails (fingers and toes) If you have artificial nails or gel coating that  need to be removed by a nail salon, please have this removed prior to surgery. Artificial nails or gel coating may interfere with anesthesia's ability to adequately monitor your vital signs.  [place] is not responsible for any belongings or valuables.    Do NOT Smoke (Tobacco/Vaping)  24 hours prior to your procedure  If you use a CPAP at night, you may bring your mask for your overnight stay.   Contacts, glasses, hearing aids, dentures or partials may not be worn into surgery, please bring cases for these belongings   For patients admitted to the hospital, discharge time will be determined by your treatment team.   Patients discharged the day of surgery will not be allowed to drive home, and someone needs to stay with them for 24 hours.   SURGICAL WAITING ROOM VISITATION Patients having surgery or a procedure may have no more than 2 support people in the waiting area - these visitors may rotate.   Children under the age of 67 must have an adult with them who is not the patient. If the patient needs to stay at the hospital during part of their recovery, the visitor guidelines for inpatient rooms apply. Pre-op nurse will coordinate an appropriate time for 1 support person to accompany patient in pre-op.  This support person may not rotate.   Please refer to [email or web address] for the visitor guidelines for Inpatients (after your surgery is over and you are in a regular  room).    Special instructions:    Oral Hygiene is also important to reduce your risk of infection.  Remember - BRUSH YOUR TEETH THE MORNING OF SURGERY WITH YOUR REGULAR TOOTHPASTE   Michelle Patton- Preparing For Surgery  Before surgery, you can play an important role. Because skin is not sterile, your skin needs to be as free of germs as possible. You can reduce the number of germs on your skin by washing with CHG (chlorahexidine gluconate) Soap before surgery.  CHG is  an antiseptic cleaner which kills germs and bonds with the skin to continue killing germs even after washing.     Please do not use if you have an allergy to CHG or antibacterial soaps. If your skin becomes reddened/irritated stop using the CHG.  Do not shave (including legs and underarms) for at least 48 hours prior to first CHG shower. It is OK to shave your face.  Please follow these instructions carefully.     Shower the NIGHT BEFORE SURGERY and the MORNING OF SURGERY with CHG Soap.   If you chose to wash your hair, wash your hair first as usual with your normal shampoo. After you shampoo, rinse your hair and body thoroughly to remove the shampoo.  Then [employer] and genitals (private parts) with your normal soap and rinse thoroughly to remove soap.  After that Use CHG Soap as you would any other liquid soap. You can apply CHG directly to the skin and wash gently with a scrungie or a clean washcloth.   Apply the CHG Soap to your body ONLY FROM THE NECK DOWN.  Do not use on open wounds or open sores. Avoid contact with your eyes, ears, mouth and genitals (private parts). Wash Face and genitals (private parts)  with your normal soap.   Wash thoroughly, paying special attention to the area where your surgery will be performed.  Thoroughly rinse your body with warm water from the neck down.  DO NOT shower/wash with your normal soap after using and rinsing off the CHG Soap.  Pat yourself dry with a CLEAN TOWEL.  Wear CLEAN PAJAMAS to bed the night before surgery  Place CLEAN SHEETS on your bed the night before your surgery  DO NOT SLEEP WITH PETS.   Day of Surgery:  Take a shower with CHG soap. Wear Clean/Comfortable clothing the morning of surgery Do not apply any deodorants/lotions.   Remember to brush your teeth WITH YOUR REGULAR TOOTHPASTE.    If you received a COVID test during your pre-op visit, it is requested that you wear a mask when out in public, stay away from  anyone that may not be feeling well, and notify your surgeon if you develop symptoms. If you have been in contact with anyone that has tested positive in the last 10 days, please notify your surgeon.    Please read over the following fact sheets that you were given.

## 2022-07-26 ENCOUNTER — Other Ambulatory Visit: Payer: Self-pay

## 2022-07-26 ENCOUNTER — Encounter: Payer: Self-pay | Admitting: Family Medicine

## 2022-07-26 ENCOUNTER — Encounter (HOSPITAL_COMMUNITY): Payer: Self-pay

## 2022-07-26 ENCOUNTER — Encounter (HOSPITAL_COMMUNITY)
Admission: RE | Admit: 2022-07-26 | Discharge: 2022-07-26 | Disposition: A | Discharge status: Home / Self Care | Payer: Medicare Other | Source: Ambulatory Visit | Attending: General Surgery | Admitting: General Surgery

## 2022-07-26 VITALS — BP 147/103 | HR 96 | Temp 98.4°F | Resp 17 | Ht 64.0 in | Wt 223.6 lb

## 2022-07-26 DIAGNOSIS — G4733 Obstructive sleep apnea (adult) (pediatric): Secondary | ICD-10-CM | POA: Insufficient documentation

## 2022-07-26 DIAGNOSIS — I10 Essential (primary) hypertension: Secondary | ICD-10-CM | POA: Insufficient documentation

## 2022-07-26 DIAGNOSIS — Z9889 Other specified postprocedural states: Secondary | ICD-10-CM | POA: Diagnosis not present

## 2022-07-26 DIAGNOSIS — Z01812 Encounter for preprocedural laboratory examination: Secondary | ICD-10-CM | POA: Insufficient documentation

## 2022-07-26 DIAGNOSIS — I350 Nonrheumatic aortic (valve) stenosis: Secondary | ICD-10-CM | POA: Diagnosis not present

## 2022-07-26 DIAGNOSIS — E785 Hyperlipidemia, unspecified: Secondary | ICD-10-CM | POA: Insufficient documentation

## 2022-07-26 DIAGNOSIS — R143 Flatulence: Secondary | ICD-10-CM | POA: Insufficient documentation

## 2022-07-26 HISTORY — DX: Other specified postprocedural states: Z98.890

## 2022-07-26 HISTORY — DX: Vascular disorder of intestine, unspecified: K55.9

## 2022-07-26 LAB — CBC
HCT: 41.2 % (ref 36.0–46.0)
Hemoglobin: 12.8 g/dL (ref 12.0–15.0)
MCH: 26.2 pg (ref 26.0–34.0)
MCHC: 31.1 g/dL (ref 30.0–36.0)
MCV: 84.3 fL (ref 80.0–100.0)
Platelets: 295 10*3/uL (ref 150–400)
RBC: 4.89 MIL/uL (ref 3.87–5.11)
RDW: 15.9 % — ABNORMAL HIGH (ref 11.5–15.5)
WBC: 9.8 10*3/uL (ref 4.0–10.5)
nRBC: 0 % (ref 0.0–0.2)

## 2022-07-26 NOTE — Telephone Encounter (Signed)
Obviously her sugar and kidney function are borderline abnormal but pt is concerned about triglycerides as she states she was fasting please advise?

## 2022-07-26 NOTE — Progress Notes (Signed)
PCP - [facility] Cardiologist - Dr. Carlyle Dolly  PPM/ICD - Denies  Chest x-ray - n/i EKG - 03/15/22 Stress Test - Had both chemical and treadmill years ago ECHO - 10/10/17 Cardiac Cath - has had those since she was 67 years old 07/31/14  Sleep Study - Yes has OSA CPAP - Not using  DM - Denies  Blood Thinner Instructions:Plavix will stop on Sunday 07/30/22 Aspirin Instructions:will stop on Sunday 07/30/22  ERAS Protcol -Yes PRE-SURGERY Ensure given   COVID TEST- N/I  Patient denies any respiratory illness, virus, cold or flu in the last two months   Anesthesia review: yes cardiac history  Patient denies shortness of breath, fever, cough and chest pain at PAT appointment   All instructions explained to the patient, with a verbal understanding of the material. Patient agrees to go over the instructions while at home for a better understanding.  The opportunity to ask questions was provided.

## 2022-07-27 NOTE — Anesthesia Preprocedure Evaluation (Addendum)
Anesthesia Evaluation  Patient identified by MRN, date of birth, ID band Patient awake    Reviewed: Allergy & Precautions, NPO status , Patient's Chart, lab work & pertinent test results, reviewed documented beta blocker date and time   History of Anesthesia Complications (+) Family history of anesthesia reaction  Airway Mallampati: II  TM Distance: >3 FB Neck ROM: Full    Dental  (+) Missing, Chipped, Dental Advisory Given   Pulmonary sleep apnea and Continuous Positive Airway Pressure Ventilation , COPD,  COPD inhaler, Patient abstained from smoking., former smoker   Pulmonary exam normal breath sounds clear to auscultation       Cardiovascular hypertension, Pt. 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  Hx/o mesenteric ischemia S/P stent   Neuro/Psych   Anxiety      Neuromuscular disease  negative psych ROS   GI/Hepatic Neg liver ROS,GERD  Medicated,,Acute Appendicitis with perforation   Endo/Other  Hypothyroidism  Obesity  Renal/GU negative Renal ROS  negative genitourinary   Musculoskeletal  (+) Arthritis , Osteoarthritis,    Abdominal  (+) + obese  Peds  Hematology negative hematology ROS (+) Plavix therapy- last dose   Anesthesia Other Findings   Reproductive/Obstetrics                              Anesthesia Physical Anesthesia Plan  ASA: 3  Anesthesia Plan: General   Post-op Pain Management: Minimal or no pain anticipated, Dilaudid IV and Precedex   Induction: Intravenous and Cricoid pressure planned  PONV Risk Score and Plan: Treatment may vary due to age or medical condition and Ondansetron  Airway Management Planned: Oral ETT  Additional Equipment: None  Intra-op Plan:   Post-operative Plan: Extubation in OR  Informed  Consent: I have reviewed the patients History and Physical, chart, labs and discussed the procedure including the risks, benefits and alternatives for the proposed anesthesia with the patient or authorized representative who has indicated his/her understanding and acceptance.     Dental advisory given  Plan Discussed with: Anesthesiologist and CRNA  Anesthesia Plan Comments: (PAT note by Antionette Poles, PA-C: Follows cardiology for history of aortic stenosis s/p tissue AVR and aortic root graft placement 2016, HTN, HLD, moderate OSA not on CPAP.  Echo 12/2021 with normal functioning AVR.  Last seen by Dr. Wyline Mood 06/08/2022 and stable at that time.  Cardiac clearance per telephone encounter 06/22/2022 by Dr. Wyline Mood states, "Ok to proceed from cardiac standpoint, IR who placed the mesenteric stent would need to make the decision on plavix."  Follows with IR for history of chronic mesenteric ischemia now status post superior mesenteric artery covered stent placement on 10/29/20 and stent recanalization with re-lining on 07/12/21.   She was hospitalized in late November 2023 for acute appendicitis with thrombosed SMA stent.  She underwent mesenteric angiogram (CO2 + IVUS) and SMA thrombectomy with DCB angioplasty of the previously placed stents on 03/21/22.   Per letter dated 06/23/2022 patient was cleared by interventional radiology to hold aspirin and Plavix:  "In regards to patient [name], Dr. Marliss Coots grants permission to hold  ASA and Plavix 5 days prior to procedure and resume the day after surgery as per Dr. Jacinto Halim recommendations."  Patient reports last dose Plavix 07/30/22.  Follows with vascular surgery for history of PAD s/p left  external iliac stent.  Preop labs reviewed, unremarkable.  EKG 03/14/22: Sinus rhythm. Rate 93.  Mesenteric ultrasound 04/21/2022: IMPRESSION: 1. Elevated peak systolic velocity in the proximal and mid aspect of the SMA may reflect turbulence related  to the presence of the stent. 2. The stent remains patent. 3. Celiac axis is largely obscured by bowel gas.  TTE 01/13/2022: 1. Left ventricular ejection fraction, by estimation, is 65 to 70%. The  left ventricle has normal function. The left ventricle has no regional  wall motion abnormalities. Left ventricular diastolic parameters are  indeterminate.   2. Right ventricular systolic function is normal. The right ventricular  size is normal. There is mildly elevated pulmonary artery systolic  pressure.   3. Right atrial size was mildly dilated.   4. The mitral valve is normal in structure. Mild mitral valve  regurgitation.   5. S/p AVR. (21 mm Medtronic Freestyle stentless porcine valve. Peak and  mean gradients through the valve are 13 and 5 mm Hg respectively.. The  aortic valve has been repaired/replaced. Aortic valve regurgitation is not  visualized.   6. The inferior vena cava is normal in size with greater than 50%  respiratory variability, suggesting right atrial pressure of 3 mmHg.   )         Anesthesia Quick Evaluation

## 2022-07-27 NOTE — Progress Notes (Signed)
Anesthesia Chart Review:  Follows cardiology for history of aortic stenosis s/p tissue AVR and aortic root graft placement 2016, HTN, HLD, moderate OSA not on CPAP.  Echo 12/2021 with normal functioning AVR.  Last seen by Dr. Harl Bowie 06/08/2022 and stable at that time.  Cardiac clearance per telephone encounter 06/22/2022 by Dr. Harl Bowie states, "Ok to proceed from cardiac standpoint, IR who placed the mesenteric stent would need to make the decision on plavix."  Follows with IR for history of chronic mesenteric ischemia now status post superior mesenteric artery covered stent placement on 10/29/20 and stent recanalization with re-lining on 07/12/21.   She was hospitalized in late November 2023 for acute appendicitis with thrombosed SMA stent.  She underwent mesenteric angiogram (CO2 + IVUS) and SMA thrombectomy with DCB angioplasty of the previously placed stents on 03/21/22.   Per letter dated 06/23/2022 patient was cleared by interventional radiology to hold aspirin and Plavix:  "In regards to patient Ms. Bethena Roys, Dr. Ruthann Cancer grants permission to hold  ASA and Plavix 5 days prior to procedure and resume the day after surgery as per Dr. Johney Frame recommendations."  Patient reports last dose Plavix 07/30/22.  Follows with vascular surgery for history of PAD s/p left external iliac stent.  Preop labs reviewed, unremarkable.  EKG 03/14/22: Sinus rhythm. Rate 93.  Mesenteric ultrasound 04/21/2022: IMPRESSION: 1. Elevated peak systolic velocity in the proximal and mid aspect of the SMA may reflect turbulence related to the presence of the stent. 2. The stent remains patent. 3. Celiac axis is largely obscured by bowel gas.  TTE 01/13/2022: 1. Left ventricular ejection fraction, by estimation, is 65 to 70%. The  left ventricle has normal function. The left ventricle has no regional  wall motion abnormalities. Left ventricular diastolic parameters are  indeterminate.   2. Right ventricular  systolic function is normal. The right ventricular  size is normal. There is mildly elevated pulmonary artery systolic  pressure.   3. Right atrial size was mildly dilated.   4. The mitral valve is normal in structure. Mild mitral valve  regurgitation.   5. S/p AVR. (21 mm Medtronic Freestyle stentless porcine valve. Peak and  mean gradients through the valve are 13 and 5 mm Hg respectively.. The  aortic valve has been repaired/replaced. Aortic valve regurgitation is not  visualized.   6. The inferior vena cava is normal in size with greater than 50%  respiratory variability, suggesting right atrial pressure of 3 mmHg.     Wynonia Musty [facility]/Anesthesiology Phone [phone] 07/27/2022 1:33 PM

## 2022-08-02 NOTE — H&P (Signed)
Chief Complaint: Urgent Visit  (Discuss Appendectomy - Lower abd pain x 2 months )       History of Present Illness: Michelle Patton is a 67 y.o. female who is seen today for perforated appendicits.   PT has completed her abx   She is havign some pain with meals No fevers.   She did undergo aCT that reviewed 2 weeks ago. The area of infection appears to be coalesced.       Review of Systems: A complete review of systems was obtained from the patient.  I have reviewed this information and discussed as appropriate with the patient.  See HPI as well for other ROS.   ROS      Medical History: Past Medical History Past Medical History: Diagnosis Date  Arthritis    CHF (congestive heart failure) (CMS-HCC)    GERD (gastroesophageal reflux disease)    Heart valve disease    Hypertension        There is no problem list on file for this patient.     Past Surgical History Past Surgical History: Procedure Laterality Date  CHOLECYSTECTOMY   2009  ARTHROSCOPIC ROTATOR CUFF REPAIR Bilateral     1999, 2005  OPEN HEART SURGERY        1968, 2016      Allergies Allergies Allergen Reactions  Iodinated Contrast Media Anaphylaxis, Itching, Shortness Of Breath and Swelling     Patient was given Omni 350 and suffered from itching, chest pain and shortness of breath. Patient was taken to ED after onset of reaction. 10/11/2021   MD zackowski noted pt had tongue and throat swelling, had to give pt epinephrine  Penicillins Hives     Has patient had a PCN reaction causing immediate rash, facial/tongue/throat swelling, SOB or lightheadedness with hypotension: Yes  Has patient had a PCN reaction causing severe rash involving mucus membranes or skin necrosis: No  Has patient had a PCN reaction that required hospitalization No  Has patient had a PCN reaction occurring within the last 10 years: No  If all of the above answers are "NO", then may proceed with Cephalosporin use.    Have  taken Keflex before without any adverse reaction.  Sulfa (Sulfonamide Antibiotics) Nausea And Vomiting      No current outpatient medications on file prior to visit.   No current facility-administered medications on file prior to visit.     Family History History reviewed. No pertinent family history.     Social History   Tobacco Use Smoking Status Former  Types: Cigarettes  Quit date: 2023  Years since quitting: 0.9 Smokeless Tobacco Never     Social History Social History    Socioeconomic History  Marital status: Married Tobacco Use  Smoking status: Former     Types: Cigarettes     Quit date: 2023     Years since quitting: 0.9  Smokeless tobacco: Never Substance and Sexual Activity  Alcohol use: Yes     Alcohol/week: 14.0 standard drinks of alcohol     Types: 14 Cans of beer per week  Drug use: Never      Objective:     Vitals:   04/18/22 1356 04/18/22 1401 BP: (!) 140/80   Pulse: 101   Temp: 36.2 C (97.1 F)   SpO2: 99%   Weight: 96.9 kg (213 lb 9.6 oz)   Height: 162.6 cm (5\' 4" )   PainSc:     5 PainLoc:   Abdomen   Body mass  index is 36.66 kg/m.   Physical Exam    General appearance - alert, well appearing, and in no distress Abdomen - soft, nontender, nondistended, no masses or organomegaly         Assessment and Plan:    Diagnoses and all orders for this visit:   Perforated appendicitis       Plan for lap appy I discussed with the patient the risks benefits of the procedure to include but not limited to: Infection, bleeding, damage to surrounding structures, possible ileus, possible postoperative infection. Patient voiced understanding and wishes to proceed.    Return in about 3 weeks (around 05/09/2022). Axel Filler, MD

## 2022-08-03 ENCOUNTER — Encounter (HOSPITAL_COMMUNITY)
Admission: RE | Disposition: A | Discharge status: Home / Self Care | Payer: Self-pay | Source: Home / Self Care | Attending: General Surgery

## 2022-08-03 ENCOUNTER — Ambulatory Visit (HOSPITAL_COMMUNITY): Payer: Medicare Other | Admitting: Physician Assistant

## 2022-08-03 ENCOUNTER — Other Ambulatory Visit: Payer: Self-pay

## 2022-08-03 ENCOUNTER — Ambulatory Visit (HOSPITAL_COMMUNITY)
Admission: RE | Admit: 2022-08-03 | Discharge: 2022-08-03 | Disposition: A | Discharge status: Home / Self Care | Payer: Medicare Other | Attending: General Surgery | Admitting: General Surgery

## 2022-08-03 ENCOUNTER — Ambulatory Visit (HOSPITAL_BASED_OUTPATIENT_CLINIC_OR_DEPARTMENT_OTHER): Payer: Medicare Other | Admitting: Certified Registered Nurse Anesthetist

## 2022-08-03 ENCOUNTER — Encounter (HOSPITAL_COMMUNITY): Payer: Self-pay | Admitting: General Surgery

## 2022-08-03 DIAGNOSIS — K388 Other specified diseases of appendix: Secondary | ICD-10-CM

## 2022-08-03 DIAGNOSIS — Z87891 Personal history of nicotine dependence: Secondary | ICD-10-CM

## 2022-08-03 DIAGNOSIS — I509 Heart failure, unspecified: Secondary | ICD-10-CM

## 2022-08-03 DIAGNOSIS — Z6836 Body mass index (BMI) 36.0-36.9, adult: Secondary | ICD-10-CM | POA: Insufficient documentation

## 2022-08-03 DIAGNOSIS — E039 Hypothyroidism, unspecified: Secondary | ICD-10-CM | POA: Insufficient documentation

## 2022-08-03 DIAGNOSIS — Z7901 Long term (current) use of anticoagulants: Secondary | ICD-10-CM | POA: Diagnosis not present

## 2022-08-03 DIAGNOSIS — Z8719 Personal history of other diseases of the digestive system: Secondary | ICD-10-CM | POA: Diagnosis not present

## 2022-08-03 DIAGNOSIS — K3532 Acute appendicitis with perforation and localized peritonitis, without abscess: Secondary | ICD-10-CM | POA: Diagnosis not present

## 2022-08-03 DIAGNOSIS — E669 Obesity, unspecified: Secondary | ICD-10-CM | POA: Diagnosis not present

## 2022-08-03 DIAGNOSIS — I110 Hypertensive heart disease with heart failure: Secondary | ICD-10-CM

## 2022-08-03 DIAGNOSIS — I739 Peripheral vascular disease, unspecified: Secondary | ICD-10-CM | POA: Diagnosis not present

## 2022-08-03 DIAGNOSIS — G4730 Sleep apnea, unspecified: Secondary | ICD-10-CM | POA: Diagnosis not present

## 2022-08-03 DIAGNOSIS — J449 Chronic obstructive pulmonary disease, unspecified: Secondary | ICD-10-CM | POA: Insufficient documentation

## 2022-08-03 DIAGNOSIS — R1030 Lower abdominal pain, unspecified: Secondary | ICD-10-CM | POA: Diagnosis present

## 2022-08-03 DIAGNOSIS — Z86718 Personal history of other venous thrombosis and embolism: Secondary | ICD-10-CM | POA: Insufficient documentation

## 2022-08-03 HISTORY — PX: LAPAROSCOPIC APPENDECTOMY: SHX408

## 2022-08-03 SURGERY — APPENDECTOMY, LAPAROSCOPIC
Anesthesia: General | Site: Abdomen

## 2022-08-03 MED ORDER — BUPIVACAINE HCL (PF) 0.25 % IJ SOLN
INTRAMUSCULAR | Status: AC
  Filled 2022-08-03: qty 30

## 2022-08-03 MED ORDER — SODIUM CHLORIDE 0.9 % IR SOLN
Status: DC | PRN
  Administered 2022-08-03: 1000 mL

## 2022-08-03 MED ORDER — BUPIVACAINE HCL 0.25 % IJ SOLN
INTRAMUSCULAR | Status: DC | PRN
  Administered 2022-08-03: 18 mL

## 2022-08-03 MED ORDER — PHENYLEPHRINE HCL-NACL 20-0.9 MG/250ML-% IV SOLN
INTRAVENOUS | Status: DC | PRN
  Administered 2022-08-03: 20 ug/min via INTRAVENOUS

## 2022-08-03 MED ORDER — CHLORHEXIDINE GLUCONATE CLOTH 2 % EX PADS: 6.0000 | MEDICATED_PAD | Freq: Once | CUTANEOUS | Status: DC

## 2022-08-03 MED ORDER — ACETAMINOPHEN 500 MG PO TABS
1000.0000 mg | ORAL_TABLET | ORAL | Status: AC
  Administered 2022-08-03: 1000 mg via ORAL
  Filled 2022-08-03: qty 2

## 2022-08-03 MED ORDER — TRAMADOL HCL 50 MG PO TABS: 50.0000 mg | ORAL_TABLET | Freq: Four times a day (QID) | ORAL | 0 refills | Status: DC | PRN

## 2022-08-03 MED ORDER — ONDANSETRON HCL 4 MG/2ML IJ SOLN: 4.0000 mg | Freq: Once | INTRAMUSCULAR | Status: DC | PRN

## 2022-08-03 MED ORDER — SODIUM CHLORIDE 0.9 % IV SOLN
2.0000 g | INTRAVENOUS | Status: AC
  Administered 2022-08-03: 2 g via INTRAVENOUS
  Filled 2022-08-03: qty 2

## 2022-08-03 MED ORDER — MIDAZOLAM HCL 2 MG/2ML IJ SOLN
INTRAMUSCULAR | Status: DC | PRN
  Administered 2022-08-03: 2 mg via INTRAVENOUS

## 2022-08-03 MED ORDER — SUGAMMADEX SODIUM 200 MG/2ML IV SOLN
INTRAVENOUS | Status: DC | PRN
  Administered 2022-08-03: 200 mg via INTRAVENOUS

## 2022-08-03 MED ORDER — ROCURONIUM BROMIDE 10 MG/ML (PF) SYRINGE
PREFILLED_SYRINGE | INTRAVENOUS | Status: DC | PRN
  Administered 2022-08-03: 70 mg via INTRAVENOUS

## 2022-08-03 MED ORDER — DEXAMETHASONE SODIUM PHOSPHATE 10 MG/ML IJ SOLN
INTRAMUSCULAR | Status: AC
  Filled 2022-08-03: qty 2

## 2022-08-03 MED ORDER — 0.9 % SODIUM CHLORIDE (POUR BTL) OPTIME
TOPICAL | Status: DC | PRN
  Administered 2022-08-03: 1000 mL

## 2022-08-03 MED ORDER — DEXMEDETOMIDINE HCL IN NACL 80 MCG/20ML IV SOLN
INTRAVENOUS | Status: DC | PRN
  Administered 2022-08-03: 8 ug via BUCCAL

## 2022-08-03 MED ORDER — LACTATED RINGERS IV SOLN: INTRAVENOUS | Status: DC

## 2022-08-03 MED ORDER — CHLORHEXIDINE GLUCONATE 0.12 % MT SOLN
15.0000 mL | Freq: Once | OROMUCOSAL | Status: AC
  Administered 2022-08-03: 15 mL via OROMUCOSAL
  Filled 2022-08-03: qty 15

## 2022-08-03 MED ORDER — HYDROMORPHONE HCL 1 MG/ML IJ SOLN: 0.2500 mg | INTRAMUSCULAR | Status: DC | PRN

## 2022-08-03 MED ORDER — OXYCODONE HCL 5 MG PO TABS
ORAL_TABLET | ORAL | Status: AC
  Filled 2022-08-03: qty 1

## 2022-08-03 MED ORDER — FENTANYL CITRATE (PF) 250 MCG/5ML IJ SOLN
INTRAMUSCULAR | Status: AC
  Filled 2022-08-03: qty 5

## 2022-08-03 MED ORDER — ENSURE PRE-SURGERY PO LIQD: 296.0000 mL | Freq: Once | ORAL | Status: DC

## 2022-08-03 MED ORDER — LIDOCAINE 2% (20 MG/ML) 5 ML SYRINGE
INTRAMUSCULAR | Status: AC
  Filled 2022-08-03: qty 10

## 2022-08-03 MED ORDER — DEXAMETHASONE SODIUM PHOSPHATE 10 MG/ML IJ SOLN
INTRAMUSCULAR | Status: DC | PRN
  Administered 2022-08-03: 5 mg via INTRAVENOUS

## 2022-08-03 MED ORDER — HYDROMORPHONE HCL 1 MG/ML IJ SOLN
INTRAMUSCULAR | Status: AC
  Filled 2022-08-03: qty 0.5

## 2022-08-03 MED ORDER — PROPOFOL 10 MG/ML IV BOLUS
INTRAVENOUS | Status: AC
  Filled 2022-08-03: qty 20

## 2022-08-03 MED ORDER — ONDANSETRON HCL 4 MG/2ML IJ SOLN
INTRAMUSCULAR | Status: AC
  Filled 2022-08-03: qty 4

## 2022-08-03 MED ORDER — ORAL CARE MOUTH RINSE: 15.0000 mL | Freq: Once | OROMUCOSAL | Status: AC

## 2022-08-03 MED ORDER — FENTANYL CITRATE (PF) 250 MCG/5ML IJ SOLN
INTRAMUSCULAR | Status: DC | PRN
  Administered 2022-08-03: 150 ug via INTRAVENOUS
  Administered 2022-08-03 (×2): 50 ug via INTRAVENOUS

## 2022-08-03 MED ORDER — OXYCODONE HCL 5 MG/5ML PO SOLN: 5.0000 mg | Freq: Once | ORAL | Status: AC | PRN

## 2022-08-03 MED ORDER — ROCURONIUM BROMIDE 10 MG/ML (PF) SYRINGE
PREFILLED_SYRINGE | INTRAVENOUS | Status: AC
  Filled 2022-08-03: qty 20

## 2022-08-03 MED ORDER — HYDROMORPHONE HCL 1 MG/ML IJ SOLN
INTRAMUSCULAR | Status: DC | PRN
  Administered 2022-08-03: .5 mg via INTRAVENOUS

## 2022-08-03 MED ORDER — EPHEDRINE 5 MG/ML INJ
INTRAVENOUS | Status: AC
  Filled 2022-08-03: qty 5

## 2022-08-03 MED ORDER — ONDANSETRON HCL 4 MG/2ML IJ SOLN
INTRAMUSCULAR | Status: DC | PRN
  Administered 2022-08-03: 4 mg via INTRAVENOUS

## 2022-08-03 MED ORDER — MIDAZOLAM HCL 2 MG/2ML IJ SOLN
INTRAMUSCULAR | Status: AC
  Filled 2022-08-03: qty 2

## 2022-08-03 MED ORDER — OXYCODONE HCL 5 MG PO TABS
5.0000 mg | ORAL_TABLET | Freq: Once | ORAL | Status: AC | PRN
  Administered 2022-08-03: 5 mg via ORAL

## 2022-08-03 MED ORDER — LIDOCAINE 2% (20 MG/ML) 5 ML SYRINGE
INTRAMUSCULAR | Status: DC | PRN
  Administered 2022-08-03: 60 mg via INTRAVENOUS

## 2022-08-03 MED ORDER — PROPOFOL 10 MG/ML IV BOLUS
INTRAVENOUS | Status: DC | PRN
  Administered 2022-08-03: 150 mg via INTRAVENOUS

## 2022-08-03 SURGICAL SUPPLY — 40 items
ADH SKN CLS APL DERMABOND .7 (GAUZE/BANDAGES/DRESSINGS) ×1
APL PRP STRL LF DISP 70% ISPRP (MISCELLANEOUS) ×1
BAG COUNTER SPONGE SURGICOUNT (BAG) ×2 IMPLANT
BAG SPNG CNTER NS LX DISP (BAG) ×1
CANISTER SUCT 3000ML PPV (MISCELLANEOUS) ×2 IMPLANT
CHLORAPREP W/TINT 26 (MISCELLANEOUS) ×2 IMPLANT
CLIP LIGATING HEMO LOK XL GOLD (MISCELLANEOUS) ×2 IMPLANT
COVER SURGICAL LIGHT HANDLE (MISCELLANEOUS) ×2 IMPLANT
COVER TRANSDUCER ULTRASND (DRAPES) ×2 IMPLANT
DERMABOND ADVANCED .7 DNX12 (GAUZE/BANDAGES/DRESSINGS) ×2 IMPLANT
ELECT REM PT RETURN 9FT ADLT (ELECTROSURGICAL) ×1
ELECTRODE REM PT RTRN 9FT ADLT (ELECTROSURGICAL) ×2 IMPLANT
ENDOLOOP SUT PDS II  0 18 (SUTURE)
ENDOLOOP SUT PDS II 0 18 (SUTURE) IMPLANT
GLOVE BIO SURGEON STRL SZ7.5 (GLOVE) ×4 IMPLANT
GOWN STRL REUS W/ TWL LRG LVL3 (GOWN DISPOSABLE) ×4 IMPLANT
GOWN STRL REUS W/ TWL XL LVL3 (GOWN DISPOSABLE) ×2 IMPLANT
GOWN STRL REUS W/TWL LRG LVL3 (GOWN DISPOSABLE) ×3
GOWN STRL REUS W/TWL XL LVL3 (GOWN DISPOSABLE) ×1
GRASPER SUT TROCAR 14GX15 (MISCELLANEOUS) ×2 IMPLANT
IRRIG SUCT STRYKERFLOW 2 WTIP (MISCELLANEOUS) ×1
IRRIGATION SUCT STRKRFLW 2 WTP (MISCELLANEOUS) ×2 IMPLANT
KIT BASIN OR (CUSTOM PROCEDURE TRAY) ×2 IMPLANT
KIT TURNOVER KIT B (KITS) ×2 IMPLANT
NDL INSUFFLATION 14GA 120MM (NEEDLE) ×2 IMPLANT
NEEDLE INSUFFLATION 14GA 120MM (NEEDLE) ×1 IMPLANT
NS IRRIG 1000ML POUR BTL (IV SOLUTION) ×2 IMPLANT
PAD ARMBOARD 7.5X6 YLW CONV (MISCELLANEOUS) ×4 IMPLANT
SCISSORS LAP 5X35 DISP (ENDOMECHANICALS) ×2 IMPLANT
SET TUBE SMOKE EVAC HIGH FLOW (TUBING) ×2 IMPLANT
SLEEVE Z-THREAD 5X100MM (TROCAR) ×2 IMPLANT
SPECIMEN JAR SMALL (MISCELLANEOUS) ×2 IMPLANT
SUT MNCRL AB 4-0 PS2 18 (SUTURE) ×2 IMPLANT
TOWEL GREEN STERILE (TOWEL DISPOSABLE) ×2 IMPLANT
TOWEL GREEN STERILE FF (TOWEL DISPOSABLE) ×2 IMPLANT
TRAY LAPAROSCOPIC MC (CUSTOM PROCEDURE TRAY) ×2 IMPLANT
TROCAR 11X100 Z THREAD (TROCAR) ×2 IMPLANT
TROCAR Z-THREAD OPTICAL 5X100M (TROCAR) ×2 IMPLANT
WARMER LAPAROSCOPE (MISCELLANEOUS) ×2 IMPLANT
WATER STERILE IRR 1000ML POUR (IV SOLUTION) ×2 IMPLANT

## 2022-08-03 NOTE — Anesthesia Procedure Notes (Signed)
Procedure Name: Intubation Date/Time: 08/03/2022 10:14 AM  Performed by: Samara Deist, CRNAPre-anesthesia Checklist: Patient identified, Emergency Drugs available, Suction available, Patient being monitored and Timeout performed Patient Re-evaluated:Patient Re-evaluated prior to induction Oxygen Delivery Method: Circle system utilized Preoxygenation: Pre-oxygenation with 100% oxygen Induction Type: IV induction Ventilation: Mask ventilation without difficulty Laryngoscope Size: Mac and 4 Grade View: Grade I Tube type: Oral Tube size: 7.0 mm Number of attempts: 1 Airway Equipment and Method: Stylet Placement Confirmation: ETT inserted through vocal cords under direct vision, positive ETCO2 and breath sounds checked- equal and bilateral Secured at: 21 cm Tube secured with: Tape Dental Injury: Teeth and Oropharynx as per pre-operative assessment

## 2022-08-03 NOTE — Interval H&P Note (Signed)
History and Physical Interval Note:  08/03/2022 9:33 AM  Michelle Patton  has presented today for surgery, with the diagnosis of HISTORY OR PERFERRATED APPENDIX.  The various methods of treatment have been discussed with the patient and family. After consideration of risks, benefits and other options for treatment, the patient has consented to  Procedure(s): APPENDECTOMY LAPAROSCOPIC (N/A) as a surgical intervention.  The patient's history has been reviewed, patient examined, no change in status, stable for surgery.  I have reviewed the patient's chart and labs.  Questions were answered to the patient's satisfaction.     Axel Filler

## 2022-08-03 NOTE — Anesthesia Postprocedure Evaluation (Signed)
Anesthesia Post Note  Patient: Michelle Patton  Procedure(s) Performed: APPENDECTOMY LAPAROSCOPIC (Abdomen)     Patient location during evaluation: PACU Anesthesia Type: General Level of consciousness: awake and alert and oriented Pain management: pain level controlled Vital Signs Assessment: post-procedure vital signs reviewed and stable Respiratory status: spontaneous breathing, nonlabored ventilation and respiratory function stable Cardiovascular status: blood pressure returned to baseline and stable Postop Assessment: no apparent nausea or vomiting Anesthetic complications: no   No notable events documented.  Last Vitals:  Vitals:   08/03/22 1145 08/03/22 1200  BP: (!) 140/95 (!) 153/58  Pulse: 77 71  Resp: 17 16  Temp:    SpO2: 94% 92%    Last Pain:  Vitals:   08/03/22 1145  TempSrc:   PainSc: 0-No pain                 [name] A.

## 2022-08-03 NOTE — Transfer of Care (Signed)
Immediate Anesthesia Transfer of Care Note  Patient: Michelle Patton  Procedure(s) Performed: APPENDECTOMY LAPAROSCOPIC (Abdomen)  Patient Location: PACU  Anesthesia Type:General  Level of Consciousness: drowsy  Airway & Oxygen Therapy: Patient Spontanous Breathing and Patient connected to nasal cannula oxygen  Post-op Assessment: Report given to RN and Post -op Vital signs reviewed and stable  Post vital signs: Reviewed and stable  Last Vitals:  Vitals Value Taken Time  BP 164/77 08/03/22 1116  Temp    Pulse 79 08/03/22 1120  Resp 12 08/03/22 1120  SpO2 93 % 08/03/22 1120  Vitals shown include unvalidated device data.  Last Pain:  Vitals:   08/03/22 0848  TempSrc:   PainSc: 3       Patients Stated Pain Goal: 2 (08/03/22 0848)  Complications: No notable events documented.

## 2022-08-03 NOTE — Op Note (Signed)
08/03/2022  10:58 AM  PATIENT:  Michelle Patton  67 y.o. female  PRE-OPERATIVE DIAGNOSIS:  HISTORY OF PERFORRATED APPENDIX  POST-OPERATIVE DIAGNOSIS:  HISTORY OF PERFORRATED APPENDIX  PROCEDURE:  Procedure(s): APPENDECTOMY LAPAROSCOPIC (N/A)  SURGEON:  Surgeon(s) and Role:    * Axel Filler, MD - Primary   ASSISTANTS: Jeronimo Greaves, RNFA   ANESTHESIA:   local and general  EBL:  minimal   BLOOD ADMINISTERED:none  DRAINS: none   LOCAL MEDICATIONS USED:  BUPIVICAINE   SPECIMEN:  Source of Specimen:  appendix  DISPOSITION OF SPECIMEN:  PATHOLOGY  COUNTS:  YES  TOURNIQUET:  * No tourniquets in log *  DICTATION: .Dragon Dictation Complications: none  Counts: reported as correct x 2  Findings:  The patient had a chronically inflamed appendix, area of previous abscess appreciated but no purulence seen.  Specimen: Appendix  Indications for procedure:  The patient is a 67 year old female with a history of perforated appendicitis.  Pt was treat medically.  Pt was counseled and reimaged and decided to have the appendix removed.  Details of the procedure:The patient was taken back to the operating room. The patient was placed in supine position with bilateral SCDs in place. The patient was prepped and draped in the usual sterile fashion.  After appropriate anitbiotics were confirmed, a time-out was confirmed and all facts were verified.    A pneumoperitoneum of 14 mmHg was obtained via a Veress needle technique in the left lower quadrant quadrant.  A 38mm trocar and 5 mm camera then placed intra-abdominally there is no injury to any intra-abdominal organs a 5 mm supraumbilical port was placed and direct visualization as was a 5 mm port in the suprapubic area.   The cecum was seen to be adherent to the right abdominal wall.  This was slowly and carefully dissected away.  I traced the tenia coli to the base of the appendix.  The appendix was identified.  The appendix was  cleaned down to the appendiceal base. The mesoappendix was then incised and the appendiceal artery was cauterized.  The the appendiceal base was clean.  A gold hemoclip was placed proximallyx2 and one distally and the appendix was transected between these 2. A retrieval bag was then placed into the abdomen and the specimen placed in the bag. The appendiceal stump was cauterized. The appendix and retrieval  bag was then retrieved via the supraumbilical port. #1 Vicryl was used to reapproximate the fascia at the 31mm port site x1. The skin was reapproximated all port sites 3-0 Monocryl subcuticular fashion. The skin was dressed with Dermabond.  The patient was awakened from general anesthesia was taken to recovery room in stable condition.      PLAN OF CARE: Discharge to home after PACU  PATIENT DISPOSITION:  PACU - hemodynamically stable.   Delay start of Pharmacological VTE agent (>24hrs) due to surgical blood loss or risk of bleeding: not applicable

## 2022-08-04 ENCOUNTER — Encounter (HOSPITAL_COMMUNITY): Payer: Self-pay | Admitting: General Surgery

## 2022-08-07 LAB — SURGICAL PATHOLOGY

## 2022-08-11 ENCOUNTER — Encounter: Payer: Self-pay | Admitting: Internal Medicine

## 2022-08-15 ENCOUNTER — Ambulatory Visit (HOSPITAL_COMMUNITY)
Admission: RE | Admit: 2022-08-15 | Discharge: 2022-08-15 | Disposition: A | Discharge status: Home / Self Care | Payer: Medicare Other | Source: Ambulatory Visit | Attending: Interventional Radiology | Admitting: Interventional Radiology

## 2022-08-15 DIAGNOSIS — K551 Chronic vascular disorders of intestine: Secondary | ICD-10-CM | POA: Diagnosis present

## 2022-08-18 ENCOUNTER — Ambulatory Visit
Admission: RE | Admit: 2022-08-18 | Discharge: 2022-08-18 | Disposition: A | Discharge status: Home / Self Care | Payer: Medicare Other | Source: Ambulatory Visit | Attending: Interventional Radiology | Admitting: Interventional Radiology

## 2022-08-18 DIAGNOSIS — K551 Chronic vascular disorders of intestine: Secondary | ICD-10-CM

## 2022-08-18 HISTORY — PX: IR RADIOLOGIST EVAL & MGMT: IMG5224

## 2022-08-18 NOTE — Progress Notes (Signed)
Reason for follow up:  Virtual telephone visit 4 months status post mesenteric angiogram, thrombectomy, and drug coated balloon angioplasty of SMA   History of present illness: 67 year old female with history of atherosclerotic disease including peripheral artery disease and chronic mesenteric ischemia now status post superior mesenteric artery covered stent placement on 10/29/20 and stent recanalization with re-lining on 07/12/21.   She was hospitalized in late November 2023 for acute appendicitis with thrombosed SMA stent.  She underwent mesenteric angiogram (CO2 + IVUS) and SMA thrombectomy with DCB angioplasty of the previously placed stents on 03/21/22.  She was discharged home the following day.  She is now also status post laparoscopic appendectomy by Dr. Derrell Lolling on 08/03/22 which was uncomplicated.  She has recovered well from this. She endorses some mild pain/discomfort in her left lower quadrant.  She is unsure if this is residual discomfort from her recent operation or something new.  She denies postprandial abdominal pain.  No blood in her stools.  No nausea, vomiting, fevers, chills.  She remains compliant with ASA and Plavix.  She underwent mesenteric duplex on 08/15/22.    Past Medical History:  Diagnosis Date   Aortic regurgitation 07/20/2014   Aortic stenosis, severe 07/20/2014   Arthritis    DVT (deep venous thrombosis) (HCC)    Family history of adverse reaction to anesthesia    Reports father deliurm in his 63's with CABG   GERD (gastroesophageal reflux disease)    History of pneumonia    Hx of repair of rotator cuff    R and L rotator cuff repair   Hyperlipidemia    Hypertension    Insomnia, unspecified    patient stated d/t menopause   Mesenteric ischemia (HCC)    Muscle weakness (generalized)    Peripheral artery disease (HCC)    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   Redo sternotomy for 21 mm Medtronic Freestyle porcine  aortic root graft w/ reimplantation of left main and right coronary arteries   Sleep apnea    diagnosed multiple years ago at [facility] aortic stenosis, congenital - s/p repair during childhood     Past Surgical History:  Procedure Laterality Date   ABDOMINAL AORTAGRAM  06/24/2012   ABDOMINAL AORTAGRAM N/A 06/24/2012   Procedure: ABDOMINAL Ronny Flurry;  Surgeon: Chuck Hint, MD;  Location: [facility] CATH LAB;  Service: Cardiovascular;  Laterality: N/A;   AORTIC VALVE REPLACEMENT N/A 10/07/2014   Procedure: REDO AORTIC VALVE REPLACEMENT (AVR);  Surgeon: Purcell Nails, MD;  Location: [facility] OR;  Service: Open Heart Surgery;  Laterality: N/A;   ASCENDING AORTIC ROOT REPLACEMENT N/A 10/07/2014   Procedure: ASCENDING AORTIC ROOT REPLACEMENT;  Surgeon: Purcell Nails, MD;  Location: [facility] OR;  Service: Open Heart Surgery;  Laterality: N/A;   BIOPSY  09/27/2016   Procedure: BIOPSY;  Surgeon: Corbin Ade, MD;  Location: [facility] ENDO SUITE;  Service: Endoscopy;;  colon   BIOPSY  10/18/2020   Procedure: BIOPSY;  Surgeon: Lanelle Bal, DO;  Location: [facility];  Service: Endoscopy;;   BREAST REDUCTION SURGERY Bilateral 01/21/2018   Procedure: BREAST REDUCTION WITH LIPOSUCTION;  Surgeon: Louisa Second, MD;  Location: [facility];  Service: Plastics;  Laterality: Bilateral;   CARDIAC VALVE SURGERY  1968   CARPAL TUNNEL RELEASE Right 03/02/2020   Procedure: CARPAL TUNNEL RELEASE;  Surgeon: Vickki Hearing, MD;  Location: [facility] ORS;  Service: Orthopedics;  Laterality: Right;  CATARACT EXTRACTION W/PHACO Left 05/30/2019   Procedure: CATARACT EXTRACTION PHACO AND INTRAOCULAR LENS PLACEMENT (IOC) (CDE: 4.94  );  Surgeon: Fabio Pierce, MD;  Location: [facility] ORS;  Service: Ophthalmology;  Laterality: Left;   CATARACT EXTRACTION W/PHACO Right 06/16/2019   Procedure: CATARACT EXTRACTION PHACO AND INTRAOCULAR LENS PLACEMENT (IOC);  Surgeon: Fabio Pierce, MD;  Location: [facility]  ORS;  Service: Ophthalmology;  Laterality: Right;  CDE: 4.64   CERVICAL FUSION     CHOLECYSTECTOMY     COLONOSCOPY WITH PROPOFOL N/A 09/27/2016   Dr. Jena Gauss: Diverticulosis, several tubular adenomas removed ranging 4 to 7 mm in size, internal grade 1 hemorrhoids, terminal ileum normal, segmental biopsies negative for microscopic colitis.  Next colonoscopy June 2021   COLONOSCOPY WITH PROPOFOL N/A 05/06/2020   internal hemorrhoids, sigmoid diverticulosis. Surveillance colonoscopy due in 2027   ESOPHAGOGASTRODUODENOSCOPY (EGD) WITH PROPOFOL N/A 09/27/2016   Dr. Jena Gauss: Small hiatal hernia, mild Schatzki ring status post disruption, LA grade a esophagitis   ESOPHAGOGASTRODUODENOSCOPY (EGD) WITH PROPOFOL N/A 10/18/2020   non-obstructing mild Schatzki ring, gastritis s/ biopsy. Negative H.pylori.   ILIAC ARTERY STENT Left 12/2007   IR ANGIOGRAM EXTREMITY BILATERAL  07/12/2021   IR ANGIOGRAM VISCERAL SELECTIVE  10/29/2020   IR ANGIOGRAM VISCERAL SELECTIVE  07/12/2021   IR ANGIOGRAM VISCERAL SELECTIVE  03/21/2022   IR AORTAGRAM ABDOMINAL SERIALOGRAM  03/21/2022   IR IVUS EACH ADDITIONAL NON CORONARY VESSEL  07/12/2021   IR IVUS EACH ADDITIONAL NON CORONARY VESSEL  03/21/2022   IR PTA NON CORO-LOWER EXTREM  03/21/2022   IR RADIOLOGIST EVAL & MGMT  10/26/2020   IR RADIOLOGIST EVAL & MGMT  11/10/2020   IR RADIOLOGIST EVAL & MGMT  02/09/2021   IR RADIOLOGIST EVAL & MGMT  06/16/2021   IR RADIOLOGIST EVAL & MGMT  08/15/2021   IR RADIOLOGIST EVAL & MGMT  10/18/2021   IR RADIOLOGIST EVAL & MGMT  01/11/2022   IR RADIOLOGIST EVAL & MGMT  03/02/2022   IR RADIOLOGIST EVAL & MGMT  05/11/2022   IR THROMBECT PRIM MECH INIT (INCLU) MOD SED  03/21/2022   IR THROMBECT SEC MECH MOD SED  07/12/2021   IR TRANSCATH PLC STENT 1ST ART NOT LE CV CAR VERT CAR  10/29/2020   IR TRANSCATH PLC STENT 1ST ART NOT LE CV CAR VERT CAR  07/12/2021   IR US GUIDE VASC ACCESS RIGHT  10/29/2020   IR US GUIDE VASC ACCESS RIGHT  07/12/2021    IR US GUIDE VASC ACCESS RIGHT  03/21/2022   KNEE ARTHROSCOPY WITH MEDIAL MENISECTOMY Right 01/10/2018   Procedure: RIGHT KNEE ARTHROSCOPY WITH PARTIAL MEDIAL MENISECTOMY;  Surgeon: Vickki Hearing, MD;  Location: [facility];  Service: Orthopedics;  Laterality: Right;   LAPAROSCOPIC APPENDECTOMY N/A 08/03/2022   Procedure: APPENDECTOMY LAPAROSCOPIC;  Surgeon: Axel Filler, MD;  Location: [facility] OR;  Service: General;  Laterality: N/A;   LEFT AND RIGHT HEART CATHETERIZATION WITH CORONARY ANGIOGRAM N/A 07/31/2014   Procedure: LEFT AND RIGHT HEART CATHETERIZATION WITH CORONARY ANGIOGRAM;  Surgeon: Kathleene Hazel, MD;  Location: [facility] CATH LAB;  Service: Cardiovascular;  Laterality: N/A;   MALONEY DILATION N/A 09/27/2016   Procedure: Elease Hashimoto DILATION;  Surgeon: Corbin Ade, MD;  [facility];  Service: Endoscopy;  Laterality: N/A;   POLYPECTOMY  09/27/2016   Procedure: POLYPECTOMY;  Surgeon: Corbin Ade, MD;  [facility];  Service: Endoscopy;;  colon   ROTATOR CUFF REPAIR Bilateral    TEE WITHOUT CARDIOVERSION N/A 07/07/2014  Procedure: TRANSESOPHAGEAL ECHOCARDIOGRAM (TEE);  Surgeon: Antoine Poche, MD;  [facility]: [facility];  Service: Cardiology;  Laterality: N/A;   TEE WITHOUT CARDIOVERSION N/A 07/20/2014   Procedure: TRANSESOPHAGEAL ECHOCARDIOGRAM (TEE) WITH PROPOFOL;  Surgeon: Antoine Poche, MD;  Location: [facility] ORS;  Service: Endoscopy;  Laterality: N/A;   TEE WITHOUT CARDIOVERSION N/A 10/07/2014   Procedure: TRANSESOPHAGEAL ECHOCARDIOGRAM (TEE);  Surgeon: Purcell Nails, MD;  Location: [facility] OR;  Service: Open Heart Surgery;  Laterality: N/A;    Allergies: Iodinated contrast media and Sulfa antibiotics  Medications: Prior to Admission medications   Medication Sig Start Date End Date Taking? Authorizing Provider  acetaminophen (TYLENOL) 325 MG tablet Take 650 mg by mouth every 6 (six) hours as needed for mild pain, moderate pain, fever or headache.     [provider]  aspirin EC 81 MG tablet Take 1 tablet (81 mg total) by mouth daily. Swallow whole. 12/16/19   Antoine Poche, MD  Cholecalciferol (VITAMIN D-3 PO) Take 1 capsule by mouth in the morning.    [provider]  clopidogrel (PLAVIX) 75 MG tablet Take 1 tablet (75 mg total) by mouth daily. 07/17/22   Shade Flood, MD  dicyclomine (BENTYL) 10 MG capsule Take 1 capsule (10 mg total) by mouth every 8 (eight) hours as needed for spasms. For left-sided discomfort and frequent stools. Watch for constipation, dry mouth, dizziness. 12/06/21   Gelene Mink, NP  estradiol (ESTRACE) 2 MG tablet Take 1 tablet (2 mg total) by mouth daily. 01/19/21   Shade Flood, MD  ezetimibe (ZETIA) 10 MG tablet Take 1 tablet (10 mg total) by mouth daily. 01/13/22 01/08/23  Dunn, Tacey Ruiz, PA-C  Flaxseed, Linseed, (FLAX SEED OIL PO) Take 1 capsule by mouth in the morning.    [provider]  hydrocortisone (ANUSOL-HC) 2.5 % rectal cream Place 1 Application rectally 2 (two) times daily. As needed for rectal bleeding. Patient taking differently: Place 1 Application rectally 2 (two) times daily as needed for hemorrhoids (rectal bleeding). 12/06/21   Gelene Mink, NP  losartan (COZAAR) 25 MG tablet TAKE 1 TABLET BY MOUTH DAILY 04/13/22   Shade Flood, MD  metoprolol tartrate (LOPRESSOR) 25 MG tablet TAKE 1 TABLET TWICE A DAY 02/24/22   Antoine Poche, MD  Multiple Minerals (CALCIUM-MAGNESIUM-ZINC) TABS Take 1 tablet by mouth in the morning.    [provider]  Multiple Vitamin (MULTIVITAMIN WITH MINERALS) TABS tablet Take 1 tablet by mouth in the morning.    [provider]  NP THYROID 120 MG tablet Take 60 mg by mouth daily before breakfast. 01/11/22   [provider]  progesterone (PROMETRIUM) 200 MG capsule Take 2 capsules (400 mg total) by mouth daily. Patient taking differently: Take 400 mg by mouth at bedtime. 01/19/21 07/21/22  Shade Flood,  MD  RABEprazole (ACIPHEX) 20 MG tablet Take 1 tablet (20 mg total) by mouth 2 (two) times daily before a meal. 02/27/22   Mahon, Frederik Schmidt, NP  rosuvastatin (CRESTOR) 40 MG tablet TAKE 1 TABLET DAILY Patient taking differently: Take 40 mg by mouth at bedtime. 01/23/22   Antoine Poche, MD  traMADol (ULTRAM) 50 MG tablet Take 1 tablet (50 mg total) by mouth every 6 (six) hours as needed. 08/03/22 08/03/23  Axel Filler, MD  triamcinolone ointment (KENALOG) 0.1 % Apply 1 Application topically 2 (two) times daily as needed. Patient taking differently: Apply 1 Application topically 2 (two) times daily as needed (  irritation). 11/25/21   Alfonse Spruce, MD     Family History  Problem Relation Age of Onset   Hypertension Mother    Hypertension Father    Heart disease Father        before age 69   Other Father        varicose veins   COPD Father    Colon cancer Neg Hx    Celiac disease Neg Hx    Inflammatory bowel disease Neg Hx     Social History   Socioeconomic History   Marital status: Married    Spouse name: Raschie   Number of children: 0   Years of education: some coll   Highest education level: Some college, no degree  Occupational History   Occupation: Financial planner: [employer]    Comment: [employer]  Tobacco Use   Smoking status: Former    Packs/day: 0.50    Years: 40.00    Additional pack years: 0.00    Total pack years: 20.00    Types: Cigarettes    Quit date: 05/2021    Years since quitting: 1.2   Smokeless tobacco: Never  Vaping Use   Vaping Use: Never used  Substance and Sexual Activity   Alcohol use: Yes    Alcohol/week: 2.0 standard drinks of alcohol    Types: 2 Cans of beer per week   Drug use: No   Sexual activity: Yes    Birth control/protection: None  Other Topics Concern   Not on file  Social History Narrative   Patient is married   for 5 years . Patient works at [employer].. 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   Social Determinants of Health   Financial Resource Strain: Low Risk  (07/13/2022)   Overall Financial Resource Strain (CARDIA)    Difficulty of Paying Living Expenses: Not hard at all  Food Insecurity: No Food Insecurity (07/13/2022)   Hunger Vital Sign    Worried About Running Out of Food in the Last Year: Never true    Ran Out of Food in the Last Year: Never true  Transportation Needs: No Transportation Needs (07/13/2022)   PRAPARE - Administrator, Civil Service (Medical): No    Lack of Transportation (Non-Medical): No  Physical Activity: Unknown (07/13/2022)   Exercise Vital Sign    Days of Exercise per Week: Patient declined    Minutes of Exercise per Session: Not on file  Stress: Stress Concern Present (07/13/2022)   [employer] of Occupational Health - Occupational Stress Questionnaire    Feeling of Stress : To some extent  Social Connections: Socially Isolated (07/13/2022)   Social Connection and Isolation Panel [NHANES]    Frequency of Communication with Friends and Family: Once a week    Frequency of Social Gatherings with Friends and Family: Once a week    Attends Religious Services: Never    Database administrator or Organizations: No    Attends Engineer, structural: Not on file    Marital Status: Married     Vital Signs: There were no vitals taken for this visit.  Physical Exam Constitutional:      General: She is not in acute distress. HENT:     Head: Normocephalic.     Mouth/Throat:     Mouth: Mucous membranes are moist.  Eyes:     General: No scleral icterus. Cardiovascular:     Rate and Rhythm: Normal rate and  regular rhythm.  Pulmonary:     Breath sounds: Normal breath sounds.  Abdominal:     General: There is no distension.  Musculoskeletal:     Right lower leg: No edema.     Left lower leg: No edema.  Skin:    General: Skin is warm and dry.     Coloration: Skin is not jaundiced.   Neurological:     Mental Status: She is alert and oriented to person, place, and time.     Imaging: Imaging: ABI (06/03/21--> 11/25/21) R = 1.11 --> 1.07 L = 0.93 --> 1.02     CTA AP (06/09/21)  Interval occlusion of SMA stent, with proximal native reconstitution.  Prominent Arc of [place].  Unchanged mild/moderate celiac stenosis, widely patent IMA.     CTA AP (07/15/21)  Recanalized and relined SMA stent, widely patent.   Angiogram (07/12/21)  Mild proximal left EIA stenosis and mild in-stent restenosis.     10/11/21  Patent SMA stent   US Carotid 01/13/22 IMPRESSION: Color duplex indicates minimal heterogeneous plaque, with no hemodynamically significant stenosis by duplex criteria in the extracranial cerebrovascular circulation.    [place] Mesenteric 01/13/22 MPRESSION: Directed duplex of the mesenteric vasculature demonstrates patent stent with questionable mid stent stenosis versus artifact.  Celiac artery not evaluated given the poor sonographic window.   [place] Mesenteric 04/21/22 IMPRESSION: 1. Elevated peak systolic velocity in the proximal and mid aspect of the SMA may reflect turbulence related to the presence of the stent. 2. The stent remains patent. 3. Celiac axis is largely obscured by bowel gas.   [place] Mesenteric 08/15/22 IMPRESSION: Increasing peak systolic velocity throughout the stented segment of the SMA concerning for possible recurrent in stent stenosis. The vascular channel was not particularly well visualized in stenosis cannot be visually confirmed.    Labs:  CBC: Recent Labs    03/21/22 0452 03/27/22 1126 04/05/22 1205 07/26/22 1600  WBC 10.6* 12.2* 10.7* 9.8  HGB 10.0* 11.2* 11.4* 12.8  HCT 30.2* 33.4* 33.2* 41.2  PLT 471* 622.0* 535.0* 295    COAGS: Recent Labs    03/14/22 2020  INR 1.1    BMP: Recent Labs    03/18/22 0733 03/19/22 0420 03/20/22 0421 03/21/22 0651 03/27/22 1126 07/18/22 0929  NA 135 135 133* 136 138 140   K 3.4* 3.7 3.1* 3.5 3.6 4.5  CL 102 103 101 101 100 102  CO2 21* 23 23 24 29 23   GLUCOSE 93 85 114* 158* 94 104*  BUN <5* <5* <5* <5* 10 10  CALCIUM 8.1* 7.9* 7.9* 8.4* 8.9 9.7  CREATININE 0.82 0.87 0.72 0.70 0.91 1.04*  GFRNONAA >60 >60 >60 >60  --   --     LIVER FUNCTION TESTS: Recent Labs    02/20/22 1626 03/14/22 2020 03/15/22 0605 07/18/22 0929  BILITOT 0.3 0.6 0.4 0.3  AST 16 14* 14* 19  ALT 13 15 14 18   ALKPHOS 70 78 68 70  PROT 7.0 7.8 6.5 7.1  ALBUMIN 4.1 3.4* 2.8* 4.2    Assessment and Plan: 67 year old female with history of atherosclerotic disease including peripheral artery disease and chronic mesenteric ischemia now status post superior mesenteric artery covered stent placement on 10/29/20 and stent recanalization with re-lining on 07/12/21.   She was hospitalized in late November 2023 for acute appendicitis with thrombosed SMA stent.  She underwent mesenteric angiogram (CO2 + IVUS) and SMA thrombectomy with DCB angioplasty of the previously placed stents on 03/21/22.  She is now status post recent appendectomy.  Recent mesenteric duplex shows increased velocities but patency of the SMA stents.  She has some mild left lower quadrant pain of uncertain etiology, but no post-prandial abdominal pain.   Follow up in 2 months, no additional imaging needed.   Electronically Signed: Bennie Dallas 08/18/2022, 7:46 AM   I spent a total of 25 Minutes in face to face in clinical consultation, greater than 50% of which was counseling/coordinating care for chronic mesenteric ischemia.

## 2022-08-28 ENCOUNTER — Other Ambulatory Visit (INDEPENDENT_AMBULATORY_CARE_PROVIDER_SITE_OTHER): Payer: Self-pay

## 2022-08-28 ENCOUNTER — Ambulatory Visit (INDEPENDENT_AMBULATORY_CARE_PROVIDER_SITE_OTHER): Payer: Medicare Other | Admitting: Gastroenterology

## 2022-08-28 ENCOUNTER — Encounter (INDEPENDENT_AMBULATORY_CARE_PROVIDER_SITE_OTHER): Payer: Self-pay | Admitting: Gastroenterology

## 2022-08-28 VITALS — BP 106/67 | HR 78 | Temp 97.1°F | Ht 60.0 in | Wt 223.8 lb

## 2022-08-28 DIAGNOSIS — K551 Chronic vascular disorders of intestine: Secondary | ICD-10-CM

## 2022-08-28 DIAGNOSIS — F17200 Nicotine dependence, unspecified, uncomplicated: Secondary | ICD-10-CM | POA: Diagnosis not present

## 2022-08-28 MED ORDER — VARENICLINE TARTRATE 0.5 MG PO TABS
ORAL_TABLET | ORAL | 0 refills | Status: DC
Start: 2022-08-28 — End: 2022-08-28

## 2022-08-28 MED ORDER — VARENICLINE TARTRATE 1 MG PO TABS: 1.0000 mg | ORAL_TABLET | Freq: Two times a day (BID) | ORAL | 1 refills | Status: DC

## 2022-08-28 MED ORDER — VARENICLINE TARTRATE 0.5 MG PO TABS: ORAL_TABLET | ORAL | 0 refills | Status: DC

## 2022-08-28 NOTE — Progress Notes (Signed)
Michelle Patton, M.D. Gastroenterology & Hepatology [facility] Gastroenterology [address] [place], [place] [address]  Primary Care Physician: Shade Flood, MD [address] [place] [address]  I will communicate my assessment and recommendations to the referring MD via EMR.  Problems: chronic mesenteric ischemia status post stent placement on 10/29/2020 complicated by recurrent thrombosis status post thrombus aspiration, SMA stent recanalization and balloon angioplasty,  History of Present Illness: Michelle Patton is a 67 y.o. female with past medical history of CAD, history of chronic abdominal pain secondary to chronic mesenteric ischemia status post stent placement on 10/29/2020 complicated by recurrent thrombosis status post SMA stent recanalization and balloon angioplasty, severe aortic stenosis, DVT, GERD, hyperlipidemia, hypertension, peripheral arterial disease, sleep apnea, who presents for follow up of chronic mesenteric ischemia.  The patient was last seen by [name], NP on 03/14/22. At that time, the patient was complaining of persistent worsening abdominal pain.  Given this, the patient was advised to go to the ER to have further evaluation.  Patient was found to have a perforated appendicitis and was given antibiotic treatment.  During that hospitalization, interventional radiology was consulted and she was seen by Dr. Elby Showers, who performed a CO2 aortogram and mesenteric angiogram given history of anaphylaxis with iodine contrast, she underwent aspiration thrombectomy of the superior mesenteric a artery and had drug-coated balloon angioplasty of the same artery.  Patient underwent appendectomy on 08/03/2022 with Dr. Derrell Lolling.  Patient was last seen by Dr. Elby Showers on 08/18/2022.  She had her last mesenteric ultrasound on 08/15/2022 showing increasing peak systolic velocity through the stented segment of SMA concerning for recurrent stenosis  but there is patent flow and she has remained without postprandial abdominal pain.  Recommended follow-up in 2 months with Dr. Elby Showers  I was reached in the past by Dr. Marliss Coots from IR as the patient wanted to switch providers have a different provider due to unconformity with her previous provider.  Patient reports feeling well and denies any complaints. She is hopeful the stent can last longer.  States that the abdominal pain used to present in the left side of her abdomen but has not felt this since her last intervention. The patient denies having any nausea, vomiting, fever, chills, hematochezia, melena, hematemesis, abdominal distention, abdominal pain, diarrhea, jaundice, pruritus or weight loss. Does not have food avoidance.  She is currently smoking - 1/2 pack daily. Wants to quit.  Last EGD:09/2020 Nonobstructing Schatzki's ring, gastritis, some falls had pallor.  Normal duodenum.  Biopsies negative for H. pylori. Last Colonoscopy: 05/06/2020 nonbleeding internal hemorrhoids, diverticulosis.  Past Medical History: Past Medical History:  Diagnosis Date   Aortic regurgitation 07/20/2014   Aortic stenosis, severe 07/20/2014   Arthritis    DVT (deep venous thrombosis) (HCC)    Family history of adverse reaction to anesthesia    Reports father deliurm in his 85's with CABG   GERD (gastroesophageal reflux disease)    History of pneumonia    Hx of repair of rotator cuff    R and L rotator cuff repair   Hyperlipidemia    Hypertension    Insomnia, unspecified    patient stated d/t menopause   Mesenteric ischemia (HCC)    Muscle weakness (generalized)    Peripheral artery disease (HCC)    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   Redo sternotomy for 21 mm Medtronic Freestyle porcine aortic root graft w/  reimplantation of left main and right coronary arteries   Sleep apnea    diagnosed multiple years ago at [facility]  aortic stenosis, congenital - s/p repair during childhood     Past Surgical History: Past Surgical History:  Procedure Laterality Date   ABDOMINAL AORTAGRAM  06/24/2012   ABDOMINAL AORTAGRAM N/A 06/24/2012   Procedure: ABDOMINAL Ronny Flurry;  Surgeon: Chuck Hint, MD;  Location: [facility] CATH LAB;  Service: Cardiovascular;  Laterality: N/A;   AORTIC VALVE REPLACEMENT N/A 10/07/2014   Procedure: REDO AORTIC VALVE REPLACEMENT (AVR);  Surgeon: Purcell Nails, MD;  Location: [facility] OR;  Service: Open Heart Surgery;  Laterality: N/A;   ASCENDING AORTIC ROOT REPLACEMENT N/A 10/07/2014   Procedure: ASCENDING AORTIC ROOT REPLACEMENT;  Surgeon: Purcell Nails, MD;  Location: [facility] OR;  Service: Open Heart Surgery;  Laterality: N/A;   BIOPSY  09/27/2016   Procedure: BIOPSY;  Surgeon: Corbin Ade, MD;  [facility]: [facility];  Service: Endoscopy;;  colon   BIOPSY  10/18/2020   Procedure: BIOPSY;  Surgeon: Lanelle Bal, DO;  [facility]: [facility];  Service: Endoscopy;;   BREAST REDUCTION SURGERY Bilateral 01/21/2018   Procedure: BREAST REDUCTION WITH LIPOSUCTION;  Surgeon: Louisa Second, MD;  Location: [facility];  Service: Plastics;  Laterality: Bilateral;   CARDIAC VALVE SURGERY  1968   CARPAL TUNNEL RELEASE Right 03/02/2020   Procedure: CARPAL TUNNEL RELEASE;  Surgeon: Vickki Hearing, MD;  Location: [facility] ORS;  Service: Orthopedics;  Laterality: Right;   CATARACT EXTRACTION W/PHACO Left 05/30/2019   Procedure: CATARACT EXTRACTION PHACO AND INTRAOCULAR LENS PLACEMENT (IOC) (CDE: 4.94  );  Surgeon: Fabio Pierce, MD;  Location: [facility] ORS;  Service: Ophthalmology;  Laterality: Left;   CATARACT EXTRACTION W/PHACO Right 06/16/2019   Procedure: CATARACT EXTRACTION PHACO AND INTRAOCULAR LENS PLACEMENT (IOC);  Surgeon: Fabio Pierce, MD;  Location: [facility] ORS;  Service: Ophthalmology;  Laterality: Right;  CDE: 4.64   CERVICAL FUSION     CHOLECYSTECTOMY     COLONOSCOPY WITH  PROPOFOL N/A 09/27/2016   Dr. Jena Gauss: Diverticulosis, several tubular adenomas removed ranging 4 to 7 mm in size, internal grade 1 hemorrhoids, terminal ileum normal, segmental biopsies negative for microscopic colitis.  Next colonoscopy June 2021   COLONOSCOPY WITH PROPOFOL N/A 05/06/2020   internal hemorrhoids, sigmoid diverticulosis. Surveillance colonoscopy due in 2027   ESOPHAGOGASTRODUODENOSCOPY (EGD) WITH PROPOFOL N/A 09/27/2016   Dr. Jena Gauss: Small hiatal hernia, mild Schatzki ring status post disruption, LA grade a esophagitis   ESOPHAGOGASTRODUODENOSCOPY (EGD) WITH PROPOFOL N/A 10/18/2020   non-obstructing mild Schatzki ring, gastritis s/ biopsy. Negative H.pylori.   ILIAC ARTERY STENT Left 12/2007   IR ANGIOGRAM EXTREMITY BILATERAL  07/12/2021   IR ANGIOGRAM VISCERAL SELECTIVE  10/29/2020   IR ANGIOGRAM VISCERAL SELECTIVE  07/12/2021   IR ANGIOGRAM VISCERAL SELECTIVE  03/21/2022   IR AORTAGRAM ABDOMINAL SERIALOGRAM  03/21/2022   IR IVUS EACH ADDITIONAL NON CORONARY VESSEL  07/12/2021   IR IVUS EACH ADDITIONAL NON CORONARY VESSEL  03/21/2022   IR PTA NON CORO-LOWER EXTREM  03/21/2022   IR RADIOLOGIST EVAL & MGMT  10/26/2020   IR RADIOLOGIST EVAL & MGMT  11/10/2020   IR RADIOLOGIST EVAL & MGMT  02/09/2021   IR RADIOLOGIST EVAL & MGMT  06/16/2021   IR RADIOLOGIST EVAL & MGMT  08/15/2021   IR RADIOLOGIST EVAL & MGMT  10/18/2021   IR RADIOLOGIST EVAL & MGMT  01/11/2022   IR RADIOLOGIST EVAL & MGMT  03/02/2022  IR RADIOLOGIST EVAL & MGMT  05/11/2022   IR RADIOLOGIST EVAL & MGMT  08/18/2022   IR THROMBECT PRIM MECH INIT (INCLU) MOD SED  03/21/2022   IR THROMBECT SEC MECH MOD SED  07/12/2021   IR TRANSCATH PLC STENT 1ST ART NOT LE CV CAR VERT CAR  10/29/2020   IR TRANSCATH PLC STENT 1ST ART NOT LE CV CAR VERT CAR  07/12/2021   IR US GUIDE VASC ACCESS RIGHT  10/29/2020   IR US GUIDE VASC ACCESS RIGHT  07/12/2021   IR US GUIDE VASC ACCESS RIGHT  03/21/2022   KNEE ARTHROSCOPY WITH MEDIAL  MENISECTOMY Right 01/10/2018   Procedure: RIGHT KNEE ARTHROSCOPY WITH PARTIAL MEDIAL MENISECTOMY;  Surgeon: Vickki Hearing, MD;  Location: [facility]S;  Service: Orthopedics;  Laterality: Right;   LAPAROSCOPIC APPENDECTOMY N/A 08/03/2022   Procedure: APPENDECTOMY LAPAROSCOPIC;  Surgeon: Axel Filler, MD;  Location: [facility] OR;  Service: General;  Laterality: N/A;   LEFT AND RIGHT HEART CATHETERIZATION WITH CORONARY ANGIOGRAM N/A 07/31/2014   Procedure: LEFT AND RIGHT HEART CATHETERIZATION WITH CORONARY ANGIOGRAM;  Surgeon: Kathleene Hazel, MD;  Location[facility] CATH LAB;  Service: Cardiovascular;  Laterality: N/A;   MALONEY DILATION N/A 09/27/2016   Procedure: Elease Hashimoto DILATION;  Surgeon: Corbin Ade, MD;  [facility];  Service: Endoscopy;  Laterality: N/A;   POLYPECTOMY  09/27/2016   Procedure: POLYPECTOMY;  Surgeon: Corbin Ade, MD;  [facility];  Service: Endoscopy;;  colon   ROTATOR CUFF REPAIR Bilateral    TEE WITHOUT CARDIOVERSION N/A 07/07/2014   Procedure: TRANSESOPHAGEAL ECHOCARDIOGRAM (TEE);  Surgeon: Antoine Poche, MD;  [facility];  Service: Cardiology;  Laterality: N/A;   TEE WITHOUT CARDIOVERSION N/A 07/20/2014   Procedure: TRANSESOPHAGEAL ECHOCARDIOGRAM (TEE) WITH PROPOFOL;  Surgeon: Antoine Poche, MD;  Location: [facility];  Service: Endoscopy;  Laterality: N/A;   TEE WITHOUT CARDIOVERSION N/A 10/07/2014   Procedure: TRANSESOPHAGEAL ECHOCARDIOGRAM (TEE);  Surgeon: Purcell Nails, MD;  Location: [facility] OR;  Service: Open Heart Surgery;  Laterality: N/A;    Family History: Family History  Problem Relation Age of Onset   Hypertension Mother    Hypertension Father    Heart disease Father        before age 30   Other Father        varicose veins   COPD Father    Colon cancer Neg Hx    Celiac disease Neg Hx    Inflammatory bowel disease Neg Hx     Social History: Social History   Tobacco Use  Smoking Status Some Days    Packs/day: 0.50   Years: 40.00   Additional pack years: 0.00   Total pack years: 20.00   Types: Cigarettes   Last attempt to quit: 05/2021   Years since quitting: 1.2  Smokeless Tobacco Never   Social History   Substance and Sexual Activity  Alcohol Use Yes   Alcohol/week: 2.0 standard drinks of alcohol   Types: 2 Cans of beer per week   Social History   Substance and Sexual Activity  Drug Use No    Allergies: Allergies  Allergen Reactions   Iodinated Contrast Media Anaphylaxis, Shortness Of Breath, Itching, Swelling and Other (See Comments)    Patient was given Omni 350 and suffered from itching, chest pain and shortness of breath. Patient was taken to ED after onset of reaction. 10/11/2021  MD zackowski noted pt had tongue and throat swelling, had to give pt  epinephrine    Sulfa Antibiotics Nausea And Vomiting    Medications: Current Outpatient Medications  Medication Sig Dispense Refill   acetaminophen (TYLENOL) 325 MG tablet Take 650 mg by mouth every 6 (six) hours as needed for mild pain, moderate pain, fever or headache.     aspirin EC 81 MG tablet Take 1 tablet (81 mg total) by mouth daily. Swallow whole. 90 tablet 3   Cholecalciferol (VITAMIN D-3 PO) Take 1 capsule by mouth in the morning.     clopidogrel (PLAVIX) 75 MG tablet Take 1 tablet (75 mg total) by mouth daily. 90 tablet 5   estradiol (ESTRACE) 2 MG tablet Take 1 tablet (2 mg total) by mouth daily. 30 tablet 3   ezetimibe (ZETIA) 10 MG tablet Take 1 tablet (10 mg total) by mouth daily. 90 tablet 3   Flaxseed, Linseed, (FLAX SEED OIL PO) Take 1 capsule by mouth in the morning.     hydrocortisone (ANUSOL-HC) 2.5 % rectal cream Place 1 Application rectally 2 (two) times daily. As needed for rectal bleeding. (Patient taking differently: Place 1 Application rectally 2 (two) times daily as needed for hemorrhoids (rectal bleeding).) 30 g 1   losartan (COZAAR) 25 MG tablet TAKE 1 TABLET BY MOUTH DAILY 90 tablet 3    metoprolol tartrate (LOPRESSOR) 25 MG tablet TAKE 1 TABLET TWICE A DAY 180 tablet 3   Multiple Minerals (CALCIUM-MAGNESIUM-ZINC) TABS Take 1 tablet by mouth in the morning.     Multiple Vitamin (MULTIVITAMIN WITH MINERALS) TABS tablet Take 1 tablet by mouth in the morning.     NP THYROID 120 MG tablet Take 60 mg by mouth daily before breakfast.     progesterone (PROMETRIUM) 200 MG capsule Take 2 capsules (400 mg total) by mouth daily. (Patient taking differently: Take 400 mg by mouth at bedtime.) 180 capsule 1   RABEprazole (ACIPHEX) 20 MG tablet Take 1 tablet (20 mg total) by mouth 2 (two) times daily before a meal. 180 tablet 3   rosuvastatin (CRESTOR) 40 MG tablet TAKE 1 TABLET DAILY (Patient taking differently: Take 40 mg by mouth at bedtime.) 90 tablet 3   triamcinolone ointment (KENALOG) 0.1 % Apply 1 Application topically 2 (two) times daily as needed. (Patient taking differently: Apply 1 Application topically 2 (two) times daily as needed (irritation).) 454 g 0   dicyclomine (BENTYL) 10 MG capsule Take 1 capsule (10 mg total) by mouth every 8 (eight) hours as needed for spasms. For left-sided discomfort and frequent stools. Watch for constipation, dry mouth, dizziness. (Patient not taking: Reported on 08/28/2022) 90 capsule 1   No current facility-administered medications for this visit.    Review of Systems: GENERAL: negative for malaise, night sweats HEENT: No changes in hearing or vision, no nose bleeds or other nasal problems. NECK: Negative for lumps, goiter, pain and significant neck swelling RESPIRATORY: Negative for cough, wheezing CARDIOVASCULAR: Negative for chest pain, leg swelling, palpitations, orthopnea GI: SEE HPI MUSCULOSKELETAL: Negative for joint pain or swelling, back pain, and muscle pain. SKIN: Negative for lesions, rash PSYCH: Negative for sleep disturbance, mood disorder and recent psychosocial stressors. HEMATOLOGY Negative for prolonged bleeding, bruising easily,  and swollen nodes. ENDOCRINE: Negative for cold or heat intolerance, polyuria, polydipsia and goiter. NEURO: negative for tremor, gait imbalance, syncope and seizures. The remainder of the review of systems is noncontributory.   Physical Exam: BP 106/67 (BP Location: Left Arm, Patient Position: Sitting, Cuff Size: Large)   Pulse 78   Temp (!) 97.1 F (  36.2 C) (Temporal)   Ht 5' (1.524 m)   Wt 223 lb 12.8 oz (101.5 kg)   BMI 43.71 kg/m  GENERAL: The patient is AO x3, in no acute distress. HEENT: Head is normocephalic and atraumatic. EOMI are intact. Mouth is well hydrated and without lesions. NECK: Supple. No masses LUNGS: Clear to auscultation. No presence of rhonchi/wheezing/rales. Adequate chest expansion HEART: RRR, normal s1 and s2. ABDOMEN: Soft, nontender, no guarding, no peritoneal signs, and nondistended. BS +. No masses. EXTREMITIES: Without any cyanosis, clubbing, rash, lesions or edema. NEUROLOGIC: AOx3, no focal motor deficit. SKIN: no jaundice, no rashes  Imaging/Labs: as above  I personally reviewed and interpreted the available labs, imaging and endoscopic files.  Impression and Plan: Michelle Patton is a 67 y.o. female with past medical history of CAD, history of chronic abdominal pain secondary to chronic mesenteric ischemia status post stent placement on 10/29/2020 complicated by recurrent thrombosis status post SMA stent recanalization and balloon angioplasty, severe aortic stenosis, DVT, GERD, hyperlipidemia, hypertension, peripheral arterial disease, sleep apnea, who presents for follow up of chronic mesenteric ischemia.  The patient has presented significant improvement of her abdominal pain and sitophobia after she had her last intervention for chronic mesenteric ischemia.  Denies having any complaints at the moment and reports feeling well without significant weight loss.  She was found to have some potential worsening of the lumen in her stent but she was  symptom-free and there was still adequate flow.  Due to this, she will need to follow-up with Dr. Elby Showers as scheduled and determine if further interventions will be warranted in the future.  I had a lengthy discussion with the patient regarding the importance of achieving smoking cessation as this may help avoiding the progression of her chronic mesenteric ischemia and peripheral arterial disease.  She is interested in achieving smoking cessation  - will start her on Chantix but also she was counseled about lifestyle modifications to avoid smoking.  - Follow up with Dr. Elby Showers as scheduled - Start Chantix -Patient to call back to our office if she presents recurrent abdominal pain  All questions were answered.      Michelle Blazing, MD Gastroenterology and Hepatology [facility] Gastroenterology

## 2022-08-28 NOTE — Patient Instructions (Addendum)
Follow up with Dr. Elby Showers as scheduled Start Chantix as follows: Days 1 to 3: 0.5 mg once daily. Days 4 to 7: 0.5 mg twice daily. Maintenance (day 8 and later): 1 mg twice daily

## 2022-08-30 ENCOUNTER — Telehealth (INDEPENDENT_AMBULATORY_CARE_PROVIDER_SITE_OTHER): Payer: Self-pay | Admitting: Gastroenterology

## 2022-08-30 NOTE — Telephone Encounter (Signed)
Dolores Frame, MD  Axel Filler, MD; Marlowe Shores, LPN [facility]ney Houseman,  Can you please schedule a colonoscopy in 3-4 weeks? Dx: serrated polyp in appendix. Room: 1  Thanks,  Katrinka Blazing, MD Gastroenterology and Hepatology [facility] Gastroenterology

## 2022-08-31 ENCOUNTER — Telehealth (INDEPENDENT_AMBULATORY_CARE_PROVIDER_SITE_OTHER): Payer: Self-pay | Admitting: Gastroenterology

## 2022-08-31 NOTE — Telephone Encounter (Signed)
    08/31/22  Michelle Patton 02-20-1956  What type of surgery is being performed? Colonoscopy  When is surgery scheduled? TBD  Clearance to hold Plavix  Name of physician performing surgery?  Dr. Katrinka Blazing [facility] Gastroenterology at [facility] Phone: [phone] Fax: [phone]  Anethesia type (none, local, MAC, general)? MAC

## 2022-08-31 NOTE — Telephone Encounter (Signed)
Left message to return call 

## 2022-08-31 NOTE — Telephone Encounter (Signed)
Pt left message returning call. Looking over pt med list;pt is on Plavix. Sent medication clearance to PCP

## 2022-09-01 NOTE — Telephone Encounter (Addendum)
She is on Plavix with history of mesenteric ischemia.  On chart review the question of  Plavix has been discussed previously, and I do see a note from her cardiologist recommending decision on holding Plavix previously to be made by interventional radiology.  I will forward this request to Dr. Elby Showers to see if he may be able to assist in this request.

## 2022-09-04 MED ORDER — PEG 3350-KCL-NA BICARB-NACL 420 G PO SOLR: 4000.0000 mL | Freq: Once | ORAL | 0 refills | Status: AC

## 2022-09-04 NOTE — Telephone Encounter (Signed)
Thanks [employer].   Michelle Patton, please let the patient know she can hold Plavix for 5 days Thanks  Pt called into office; returning my call. Pt scheduled for colonoscopy 10/06/22 at 10:45 am. Prep sent to pharmacy and instructions mailed per pt request.  Per [facility] Notification or Prior Authorization is not required for the requested services You are not required to submit a notification/prior authorization based on the information provided. If you have general questions about the prior authorization requirements, visit [email or web address] > Clinician Resources > Advance and Admission Notification Requirements. The number above acknowledges your notification. Please write this reference number down for future reference. If you would like to request an organization determination, please call us at [phone]. Decision ID #: [id number]

## 2022-09-04 NOTE — Addendum Note (Signed)
Addended by: Marlowe Shores on: 09/04/2022 09:48 AM   Modules accepted: Orders

## 2022-09-04 NOTE — Telephone Encounter (Signed)
Thanks [employer].  Tanya, please let the patient know she can hold Plavix for 5 days Thanks

## 2022-09-10 ENCOUNTER — Encounter (INDEPENDENT_AMBULATORY_CARE_PROVIDER_SITE_OTHER): Payer: Self-pay | Admitting: Gastroenterology

## 2022-10-06 ENCOUNTER — Encounter (HOSPITAL_COMMUNITY)
Admission: RE | Disposition: A | Discharge status: Home / Self Care | Payer: Self-pay | Source: Home / Self Care | Attending: Gastroenterology

## 2022-10-06 ENCOUNTER — Ambulatory Visit (HOSPITAL_BASED_OUTPATIENT_CLINIC_OR_DEPARTMENT_OTHER): Payer: Medicare Other | Admitting: Anesthesiology

## 2022-10-06 ENCOUNTER — Ambulatory Visit (HOSPITAL_COMMUNITY)
Admission: RE | Admit: 2022-10-06 | Discharge: 2022-10-06 | Disposition: A | Discharge status: Home / Self Care | Payer: Medicare Other | Attending: Gastroenterology | Admitting: Gastroenterology

## 2022-10-06 ENCOUNTER — Encounter (HOSPITAL_COMMUNITY): Payer: Self-pay | Admitting: Gastroenterology

## 2022-10-06 ENCOUNTER — Ambulatory Visit (HOSPITAL_COMMUNITY): Payer: Medicare Other | Admitting: Anesthesiology

## 2022-10-06 ENCOUNTER — Other Ambulatory Visit: Payer: Self-pay

## 2022-10-06 DIAGNOSIS — K648 Other hemorrhoids: Secondary | ICD-10-CM

## 2022-10-06 DIAGNOSIS — E039 Hypothyroidism, unspecified: Secondary | ICD-10-CM | POA: Diagnosis not present

## 2022-10-06 DIAGNOSIS — Z86010 Personal history of colonic polyps: Secondary | ICD-10-CM

## 2022-10-06 DIAGNOSIS — D120 Benign neoplasm of cecum: Secondary | ICD-10-CM

## 2022-10-06 DIAGNOSIS — G8929 Other chronic pain: Secondary | ICD-10-CM | POA: Diagnosis not present

## 2022-10-06 DIAGNOSIS — I352 Nonrheumatic aortic (valve) stenosis with insufficiency: Secondary | ICD-10-CM | POA: Insufficient documentation

## 2022-10-06 DIAGNOSIS — Z1211 Encounter for screening for malignant neoplasm of colon: Secondary | ICD-10-CM | POA: Insufficient documentation

## 2022-10-06 DIAGNOSIS — J449 Chronic obstructive pulmonary disease, unspecified: Secondary | ICD-10-CM

## 2022-10-06 DIAGNOSIS — Z86718 Personal history of other venous thrombosis and embolism: Secondary | ICD-10-CM | POA: Insufficient documentation

## 2022-10-06 DIAGNOSIS — Z09 Encounter for follow-up examination after completed treatment for conditions other than malignant neoplasm: Secondary | ICD-10-CM | POA: Diagnosis not present

## 2022-10-06 DIAGNOSIS — Z955 Presence of coronary angioplasty implant and graft: Secondary | ICD-10-CM | POA: Insufficient documentation

## 2022-10-06 DIAGNOSIS — I10 Essential (primary) hypertension: Secondary | ICD-10-CM | POA: Diagnosis not present

## 2022-10-06 DIAGNOSIS — K2100 Gastro-esophageal reflux disease with esophagitis, without bleeding: Secondary | ICD-10-CM | POA: Diagnosis not present

## 2022-10-06 DIAGNOSIS — D122 Benign neoplasm of ascending colon: Secondary | ICD-10-CM

## 2022-10-06 DIAGNOSIS — D123 Benign neoplasm of transverse colon: Secondary | ICD-10-CM | POA: Diagnosis not present

## 2022-10-06 DIAGNOSIS — Z7902 Long term (current) use of antithrombotics/antiplatelets: Secondary | ICD-10-CM | POA: Diagnosis not present

## 2022-10-06 DIAGNOSIS — Z9049 Acquired absence of other specified parts of digestive tract: Secondary | ICD-10-CM | POA: Diagnosis not present

## 2022-10-06 DIAGNOSIS — K635 Polyp of colon: Secondary | ICD-10-CM | POA: Diagnosis not present

## 2022-10-06 DIAGNOSIS — Z87891 Personal history of nicotine dependence: Secondary | ICD-10-CM | POA: Insufficient documentation

## 2022-10-06 DIAGNOSIS — I2510 Atherosclerotic heart disease of native coronary artery without angina pectoris: Secondary | ICD-10-CM | POA: Diagnosis not present

## 2022-10-06 DIAGNOSIS — I739 Peripheral vascular disease, unspecified: Secondary | ICD-10-CM | POA: Insufficient documentation

## 2022-10-06 DIAGNOSIS — E785 Hyperlipidemia, unspecified: Secondary | ICD-10-CM | POA: Diagnosis not present

## 2022-10-06 DIAGNOSIS — K5730 Diverticulosis of large intestine without perforation or abscess without bleeding: Secondary | ICD-10-CM

## 2022-10-06 DIAGNOSIS — G4730 Sleep apnea, unspecified: Secondary | ICD-10-CM | POA: Diagnosis not present

## 2022-10-06 DIAGNOSIS — M1990 Unspecified osteoarthritis, unspecified site: Secondary | ICD-10-CM | POA: Diagnosis not present

## 2022-10-06 HISTORY — PX: POLYPECTOMY: SHX5525

## 2022-10-06 HISTORY — PX: HEMOSTASIS CLIP PLACEMENT: SHX6857

## 2022-10-06 HISTORY — PX: COLONOSCOPY WITH PROPOFOL: SHX5780

## 2022-10-06 HISTORY — PX: SUBMUCOSAL LIFTING INJECTION: SHX6855

## 2022-10-06 SURGERY — COLONOSCOPY WITH PROPOFOL
Anesthesia: General

## 2022-10-06 MED ORDER — LIDOCAINE HCL (CARDIAC) PF 100 MG/5ML IV SOSY
PREFILLED_SYRINGE | INTRAVENOUS | Status: DC | PRN
  Administered 2022-10-06: 50 mg via INTRAVENOUS

## 2022-10-06 MED ORDER — PHENYLEPHRINE 80 MCG/ML (10ML) SYRINGE FOR IV PUSH (FOR BLOOD PRESSURE SUPPORT)
PREFILLED_SYRINGE | INTRAVENOUS | Status: AC
  Filled 2022-10-06: qty 10

## 2022-10-06 MED ORDER — DEXMEDETOMIDINE HCL IN NACL 80 MCG/20ML IV SOLN
INTRAVENOUS | Status: DC | PRN
  Administered 2022-10-06: 12 ug via INTRAVENOUS
  Administered 2022-10-06: 8 ug via INTRAVENOUS

## 2022-10-06 MED ORDER — PROPOFOL 500 MG/50ML IV EMUL
INTRAVENOUS | Status: DC | PRN
  Administered 2022-10-06: 150 ug/kg/min via INTRAVENOUS

## 2022-10-06 MED ORDER — PROPOFOL 10 MG/ML IV BOLUS
INTRAVENOUS | Status: DC | PRN
  Administered 2022-10-06: 20 mg via INTRAVENOUS
  Administered 2022-10-06: 100 mg via INTRAVENOUS

## 2022-10-06 MED ORDER — LACTATED RINGERS IV SOLN
INTRAVENOUS | Status: DC
  Administered 2022-10-06: 1000 mL via INTRAVENOUS

## 2022-10-06 MED ORDER — PHENYLEPHRINE 80 MCG/ML (10ML) SYRINGE FOR IV PUSH (FOR BLOOD PRESSURE SUPPORT)
PREFILLED_SYRINGE | INTRAVENOUS | Status: DC | PRN
  Administered 2022-10-06 (×5): 160 ug via INTRAVENOUS

## 2022-10-06 NOTE — Anesthesia Preprocedure Evaluation (Signed)
Anesthesia Evaluation  Patient identified by MRN, date of birth, ID band Patient awake    Reviewed: Allergy & Precautions, H&P , NPO status , Patient's Chart, lab work & pertinent test results  History of Anesthesia Complications (+) Family history of anesthesia reaction  Airway Mallampati: II  TM Distance: >3 FB Neck ROM: Full    Dental  (+) Missing, Dental Advisory Given   Pulmonary sleep apnea , COPD,  COPD inhaler, Patient abstained from smoking., former smoker   Pulmonary exam normal breath sounds clear to auscultation       Cardiovascular Exercise Tolerance: Good hypertension, Pt. on medications + Peripheral Vascular Disease and +CHF  Normal cardiovascular exam+ Valvular Problems/Murmurs AI  Rhythm:Regular Rate:Normal     Neuro/Psych  Neuromuscular disease  negative psych ROS   GI/Hepatic Neg liver ROS,GERD  Medicated and Controlled,,  Endo/Other  Hypothyroidism    Renal/GU negative Renal ROS  negative genitourinary   Musculoskeletal  (+) Arthritis , Osteoarthritis,    Abdominal   Peds negative pediatric ROS (+)  Hematology negative hematology ROS (+)   Anesthesia Other Findings   Reproductive/Obstetrics negative OB ROS                             Anesthesia Physical Anesthesia Plan  ASA: 3  Anesthesia Plan: General   Post-op Pain Management: Minimal or no pain anticipated   Induction: Intravenous  PONV Risk Score and Plan: 1 and Propofol infusion  Airway Management Planned: Nasal Cannula and Natural Airway  Additional Equipment:   Intra-op Plan:   Post-operative Plan:   Informed Consent: I have reviewed the patients History and Physical, chart, labs and discussed the procedure including the risks, benefits and alternatives for the proposed anesthesia with the patient or authorized representative who has indicated his/her understanding and acceptance.     Dental  advisory given  Plan Discussed with: CRNA and Surgeon  Anesthesia Plan Comments:        Anesthesia Quick Evaluation

## 2022-10-06 NOTE — Anesthesia Postprocedure Evaluation (Signed)
Anesthesia Post Note  Patient: Shacoria Stehl  Procedure(s) Performed: COLONOSCOPY WITH PROPOFOL POLYPECTOMY HEMOSTASIS CLIP PLACEMENT SUBMUCOSAL LIFTING INJECTION  Patient location during evaluation: Phase II Anesthesia Type: General Level of consciousness: awake and alert and oriented Pain management: pain level controlled Vital Signs Assessment: post-procedure vital signs reviewed and stable Respiratory status: spontaneous breathing, nonlabored ventilation and respiratory function stable Cardiovascular status: blood pressure returned to baseline and stable Postop Assessment: no apparent nausea or vomiting Anesthetic complications: no  No notable events documented.   Last Vitals:  Vitals:   10/06/22 1147 10/06/22 1151  BP: (!) 100/31 104/62  Pulse: 75 79  Resp: 13 18  Temp: 36.4 C   SpO2: 94% 94%    Last Pain:  Vitals:   10/06/22 1153  TempSrc:   PainSc: 0-No pain                 Kaydie Petsch C Panda Crossin

## 2022-10-06 NOTE — Anesthesia Procedure Notes (Signed)
Date/Time: 10/06/2022 10:58 AM  Performed by: Julian Reil, CRNAPre-anesthesia Checklist: Patient identified, Emergency Drugs available, Suction available and Patient being monitored Patient Re-evaluated:Patient Re-evaluated prior to induction Oxygen Delivery Method: Nasal cannula Induction Type: IV induction Placement Confirmation: positive ETCO2

## 2022-10-06 NOTE — H&P (Addendum)
Michelle Patton is an 67 y.o. female.   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 HPI: Michelle Patton is a 67 y.o. 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  The patient denies having any complaints such as melena, hematochezia, abdominal pain or distention, change in her bowel movement consistency or frequency, no changes in weight recently.  No family history of colorectal cancer.  Last Colonoscopy: 05/06/2020 nonbleeding internal hemorrhoids, diverticulosis.   Pt had recent appendectomy and was found to have a SSL in the specimen.  Past Medical History:  Diagnosis Date   Aortic regurgitation 07/20/2014   Aortic stenosis, severe 07/20/2014   Arthritis    DVT (deep venous thrombosis) (HCC)    Family history of adverse reaction to anesthesia    Reports father deliurm in his 65's with CABG   GERD (gastroesophageal reflux disease)    History of pneumonia    Hx of repair of rotator cuff    R and L rotator cuff repair   Hyperlipidemia    Hypertension    Insomnia, unspecified    patient stated d/t menopause   Mesenteric ischemia (HCC)    Muscle weakness (generalized)    Peripheral artery disease (HCC)    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   Redo sternotomy for 21 mm Medtronic Freestyle porcine aortic root graft w/ reimplantation of left main and right coronary arteries   Sleep apnea    diagnosed multiple years ago at [facility] aortic stenosis, congenital - s/p repair during childhood     Past Surgical History:  Procedure  Laterality Date   ABDOMINAL AORTAGRAM  06/24/2012   ABDOMINAL AORTAGRAM N/A 06/24/2012   Procedure: ABDOMINAL Ronny Flurry;  Surgeon: Chuck Hint, MD;  Location: [facility] CATH LAB;  Service: Cardiovascular;  Laterality: N/A;   AORTIC VALVE REPLACEMENT N/A 10/07/2014   Procedure: REDO AORTIC VALVE REPLACEMENT (AVR);  Surgeon: Purcell Nails, MD;  Location: [facility] OR;  Service: Open Heart Surgery;  Laterality: N/A;   ASCENDING AORTIC ROOT REPLACEMENT N/A 10/07/2014   Procedure: ASCENDING AORTIC ROOT REPLACEMENT;  Surgeon: Purcell Nails, MD;  Location: [facility] OR;  Service: Open Heart Surgery;  Laterality: N/A;   BIOPSY  09/27/2016   Procedure: BIOPSY;  Surgeon: Corbin Ade, MD;  Location: [facility] ENDO SUITE;  Service: Endoscopy;;  colon   BIOPSY  10/18/2020   Procedure: BIOPSY;  Surgeon: Lanelle Bal, DO;  Location: [facility] END[facility];  Service: Endoscopy;;   BREAST REDUCTION SURGERY Bilateral 01/21/2018   Procedure: BREAST REDUCTION WITH LIPOSUCTION;  Surgeon: Louisa Second, MD;  Location: [facility];  Service: Plastics;  Laterality: Bilateral;   CARDIAC VALVE SURGERY  1968   CARPAL TUNNEL RELEASE Right 03/02/2020   Procedure: CARPAL TUNNEL RELEASE;  Surgeon: Vickki Hearing, MD;  Location: [facility] ORS;  Service: Orthopedics;  Laterality: Right;   CATARACT EXTRACTION W/PHACO Left 05/30/2019   Procedure: CATARACT EXTRACTION PHACO AND INTRAOCULAR LENS PLACEMENT (IOC) (CDE: 4.94  );  Surgeon: Fabio Pierce, MD;  Location: [facility] ORS;  Service: Ophthalmology;  Laterality: Left;   CATARACT EXTRACTION W/PHACO Right 06/16/2019   Procedure: CATARACT EXTRACTION PHACO AND INTRAOCULAR  LENS PLACEMENT (IOC);  Surgeon: Fabio Pierce, MD;  Location: [facility] ORS;  Service: Ophthalmology;  Laterality: Right;  CDE: 4.64   CERVICAL FUSION     CHOLECYSTECTOMY     COLONOSCOPY WITH PROPOFOL N/A 09/27/2016   Dr. Jena Gauss: Diverticulosis, several tubular adenomas removed ranging 4 to 7 mm in size, internal grade 1  hemorrhoids, terminal ileum normal, segmental biopsies negative for microscopic colitis.  Next colonoscopy June 2021   COLONOSCOPY WITH PROPOFOL N/A 05/06/2020   internal hemorrhoids, sigmoid diverticulosis. Surveillance colonoscopy due in 2027   ESOPHAGOGASTRODUODENOSCOPY (EGD) WITH PROPOFOL N/A 09/27/2016   Dr. Jena Gauss: Small hiatal hernia, mild Schatzki ring status post disruption, LA grade a esophagitis   ESOPHAGOGASTRODUODENOSCOPY (EGD) WITH PROPOFOL N/A 10/18/2020   non-obstructing mild Schatzki ring, gastritis s/ biopsy. Negative H.pylori.   ILIAC ARTERY STENT Left 12/2007   IR ANGIOGRAM EXTREMITY BILATERAL  07/12/2021   IR ANGIOGRAM VISCERAL SELECTIVE  10/29/2020   IR ANGIOGRAM VISCERAL SELECTIVE  07/12/2021   IR ANGIOGRAM VISCERAL SELECTIVE  03/21/2022   IR AORTAGRAM ABDOMINAL SERIALOGRAM  03/21/2022   IR IVUS EACH ADDITIONAL NON CORONARY VESSEL  07/12/2021   IR IVUS EACH ADDITIONAL NON CORONARY VESSEL  03/21/2022   IR PTA NON CORO-LOWER EXTREM  03/21/2022   IR RADIOLOGIST EVAL & MGMT  10/26/2020   IR RADIOLOGIST EVAL & MGMT  11/10/2020   IR RADIOLOGIST EVAL & MGMT  02/09/2021   IR RADIOLOGIST EVAL & MGMT  06/16/2021   IR RADIOLOGIST EVAL & MGMT  08/15/2021   IR RADIOLOGIST EVAL & MGMT  10/18/2021   IR RADIOLOGIST EVAL & MGMT  01/11/2022   IR RADIOLOGIST EVAL & MGMT  03/02/2022   IR RADIOLOGIST EVAL & MGMT  05/11/2022   IR RADIOLOGIST EVAL & MGMT  08/18/2022   IR THROMBECT PRIM MECH INIT (INCLU) MOD SED  03/21/2022   IR THROMBECT SEC MECH MOD SED  07/12/2021   IR TRANSCATH PLC STENT 1ST ART NOT LE CV CAR VERT CAR  10/29/2020   IR TRANSCATH PLC STENT 1ST ART NOT LE CV CAR VERT CAR  07/12/2021   IR US GUIDE VASC ACCESS RIGHT  10/29/2020   IR US GUIDE VASC ACCESS RIGHT  07/12/2021   IR US GUIDE VASC ACCESS RIGHT  03/21/2022   KNEE ARTHROSCOPY WITH MEDIAL MENISECTOMY Right 01/10/2018   Procedure: RIGHT KNEE ARTHROSCOPY WITH PARTIAL MEDIAL MENISECTOMY;  Surgeon: Vickki Hearing, MD;   Location: [facility] ORS;  Service: Orthopedics;  Laterality: Right;   LAPAROSCOPIC APPENDECTOMY N/A 08/03/2022   Procedure: APPENDECTOMY LAPAROSCOPIC;  Surgeon: Axel Filler, MD;  Location: [facility] OR;  Service: General;  Laterality: N/A;   LEFT AND RIGHT HEART CATHETERIZATION WITH CORONARY ANGIOGRAM N/A 07/31/2014   Procedure: LEFT AND RIGHT HEART CATHETERIZATION WITH CORONARY ANGIOGRAM;  Surgeon: Kathleene Hazel, MD;  Location: [facility] CATH LAB;  Service: Cardiovascular;  Laterality: N/A;   MALONEY DILATION N/A 09/27/2016   Procedure: Elease Hashimoto DILATION;  Surgeon: Corbin Ade, MD;  [facility];  Service: Endoscopy;  Laterality: N/A;   POLYPECTOMY  09/27/2016   Procedure: POLYPECTOMY;  Surgeon: Corbin Ade, MD;  [facility]: [facility];  Service: Endoscopy;;  colon   ROTATOR CUFF REPAIR Bilateral    TEE WITHOUT CARDIOVERSION N/A 07/07/2014   Procedure: TRANSESOPHAGEAL ECHOCARDIOGRAM (TEE);  Surgeon: Antoine Poche, MD;  Location: [facility];  Service: Cardiology;  Laterality: N/A;   TEE WITHOUT CARDIOVERSION N/A 07/20/2014   Procedure: TRANSESOPHAGEAL ECHOCARDIOGRAM (TEE) WITH PROPOFOL;  Surgeon: Antoine Poche,  MD;  Location: [facility] ORS;  Service: Endoscopy;  Laterality: N/A;   TEE WITHOUT CARDIOVERSION N/A 10/07/2014   Procedure: TRANSESOPHAGEAL ECHOCARDIOGRAM (TEE);  Surgeon: Purcell Nails, MD;  Location: [facility] OR;  Service: Open Heart Surgery;  Laterality: N/A;    Family History  Problem Relation Age of Onset   Hypertension Mother    Hypertension Father    Heart disease Father        before age 37   Other Father        varicose veins   COPD Father    Colon cancer Neg Hx    Celiac disease Neg Hx    Inflammatory bowel disease Neg Hx    Social History:  reports that she quit smoking about 16 months ago. Her smoking use included cigarettes. She has a 20.00 pack-year smoking history. She has never used smokeless tobacco. She reports current alcohol use of about 2.0  standard drinks of alcohol per week. She reports that she does not use drugs.  Allergies:  Allergies  Allergen Reactions   Iodinated Contrast Media Anaphylaxis, Shortness Of Breath, Itching, Swelling and Other (See Comments)    Patient was given Omni 350 and suffered from itching, chest pain and shortness of breath. Patient was taken to ED after onset of reaction. 10/11/2021  MD zackowski noted pt had tongue and throat swelling, had to give pt epinephrine    Sulfa Antibiotics Nausea And Vomiting    Medications Prior to Admission  Medication Sig Dispense Refill   aspirin EC 81 MG tablet Take 1 tablet (81 mg total) by mouth daily. Swallow whole. 90 tablet 3   Cholecalciferol (VITAMIN D-3 PO) Take 1 capsule by mouth in the morning and at bedtime.     clopidogrel (PLAVIX) 75 MG tablet Take 1 tablet (75 mg total) by mouth daily. (Patient taking differently: Take 75 mg by mouth every evening.) 90 tablet 5   estradiol (ESTRACE) 2 MG tablet Take 1 tablet (2 mg total) by mouth daily. 30 tablet 3   ezetimibe (ZETIA) 10 MG tablet Take 1 tablet (10 mg total) by mouth daily. 90 tablet 3   Flaxseed, Linseed, (FLAX SEED OIL PO) Take 1 capsule by mouth in the morning.     losartan (COZAAR) 25 MG tablet TAKE 1 TABLET BY MOUTH DAILY 90 tablet 3   metoprolol tartrate (LOPRESSOR) 25 MG tablet TAKE 1 TABLET TWICE A DAY 180 tablet 3   Multiple Minerals (CALCIUM-MAGNESIUM-ZINC) TABS Take 1 tablet by mouth in the morning.     Multiple Vitamin (MULTIVITAMIN WITH MINERALS) TABS tablet Take 1 tablet by mouth in the morning.     NP THYROID 120 MG tablet Take 60 mg by mouth daily before breakfast.     polyethylene glycol-electrolytes (NULYTELY) 420 g solution Take 4,000 mLs by mouth once.     progesterone (PROMETRIUM) 200 MG capsule Take 2 capsules (400 mg total) by mouth daily. (Patient taking differently: Take 400 mg by mouth at bedtime.) 180 capsule 1   RABEprazole (ACIPHEX) 20 MG tablet Take 1 tablet (20 mg total)  by mouth 2 (two) times daily before a meal. 180 tablet 3   rosuvastatin (CRESTOR) 40 MG tablet TAKE 1 TABLET DAILY (Patient taking differently: Take 40 mg by mouth at bedtime.) 90 tablet 3   varenicline (CHANTIX CONTINUING MONTH PAK) 1 MG tablet Take 1 tablet (1 mg total) by mouth 2 (two) times daily. 28 tablet 1   varenicline (CHANTIX) 0.5 MG tablet Take 0.5 po Qd for  three days,then take 0.5 po bid for 4 days (Patient not taking: Reported on 10/03/2022) 11 tablet 0   acetaminophen (TYLENOL) 325 MG tablet Take 650 mg by mouth every 6 (six) hours as needed for mild pain, moderate pain, fever or headache.     hydrocortisone (ANUSOL-HC) 2.5 % rectal cream Place 1 Application rectally 2 (two) times daily. As needed for rectal bleeding. (Patient taking differently: Place 1 Application rectally 2 (two) times daily as needed for hemorrhoids (rectal bleeding).) 30 g 1   triamcinolone ointment (KENALOG) 0.1 % Apply 1 Application topically 2 (two) times daily as needed. (Patient taking differently: Apply 1 Application topically 2 (two) times daily as needed (irritation).) 454 g 0    No results found for this or any previous visit (from the past 48 hour(s)). No results found.  Review of Systems  All other systems reviewed and are negative.   Blood pressure 114/73, pulse 77, temperature 98.3 F (36.8 C), temperature source Oral, resp. rate 13, height 5\' 5"  (1.651 m), weight 97.5 kg, SpO2 95 %. Physical Exam  GENERAL: The patient is AO x3, in no acute distress. HEENT: Head is normocephalic and atraumatic. EOMI are intact. Mouth is well hydrated and without lesions. NECK: Supple. No masses LUNGS: Clear to auscultation. No presence of rhonchi/wheezing/rales. Adequate chest expansion HEART: RRR, normal s1 and s2. ABDOMEN: Soft, nontender, no guarding, no peritoneal signs, and nondistended. BS +. No masses. EXTREMITIES: Without any cyanosis, clubbing, rash, lesions or edema. NEUROLOGIC: AOx3, no focal motor  deficit. SKIN: no jaundice, no rashes  Assessment/Plan Michelle Patton is a 67 y.o. 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  Will proceed with colonoscopy.  Michelle Frame, MD 10/06/2022, 11:44 AM

## 2022-10-06 NOTE — Transfer of Care (Signed)
Immediate Anesthesia Transfer of Care Note  Patient: Michelle Patton  Procedure(s) Performed: COLONOSCOPY WITH PROPOFOL POLYPECTOMY HEMOSTASIS CLIP PLACEMENT SUBMUCOSAL LIFTING INJECTION  Patient Location: Endoscopy Unit  Anesthesia Type:General  Level of Consciousness: drowsy  Airway & Oxygen Therapy: Patient Spontanous Breathing  Post-op Assessment: Report given to RN and Post -op Vital signs reviewed and stable  Post vital signs: Reviewed and stable  Last Vitals:  Vitals Value Taken Time  BP 100/31 10/06/22 1147  Temp 36.4 C 10/06/22 1147  Pulse 75 10/06/22 1147  Resp 13 10/06/22 1147  SpO2 94 % 10/06/22 1147    Last Pain:  Vitals:   10/06/22 1147  TempSrc: Oral  PainSc:       Patients Stated Pain Goal: 9 (10/06/22 0944)  Complications: No notable events documented.

## 2022-10-06 NOTE — Discharge Instructions (Addendum)
You are being discharged to home.  Resume your previous diet.  We are waiting for your pathology results.  Your physician has recommended a repeat colonoscopy in one year for surveillance after today's piecemeal polypectomy.  Restart Plavix in 2 days.

## 2022-10-06 NOTE — Op Note (Addendum)
[facility] Patient Name: Michelle Patton Procedure Date: 10/06/2022 10:44 AM MRN: [id number] Date of Birth: 12-12-1955 Attending MD: Katrinka Blazing , , [id number] CSN: [id number] Age: 67 Admit Type: Outpatient Procedure:                Colonoscopy Indications:              High risk colon cancer surveillance: Personal                            history of sessile serrated colon polyp (10 mm or                            greater in size) Providers:                Katrinka Blazing, Jannett Celestine, RN, Pandora Leiter,                            Technician Referring MD:              Medicines:                Monitored Anesthesia Care Complications:            No immediate complications. Estimated Blood Loss:     Estimated blood loss: none. Procedure:                Pre-Anesthesia Assessment:                           - Prior to the procedure, a History and Physical                            was performed, and patient medications, allergies                            and sensitivities were reviewed. The patient's                            tolerance of previous anesthesia was reviewed.                           - The risks and benefits of the procedure and the                            sedation options and risks were discussed with the                            patient. All questions were answered and informed                            consent was obtained.                           - ASA Grade Assessment: II - A patient with mild                            systemic disease.  After obtaining informed consent, the colonoscope                            was passed under direct vision. Throughout the                            procedure, the patient's blood pressure, pulse, and                            oxygen saturations were monitored continuously. The                            [id number]-[id number] ([id number]) scope was introduced through                            the  anus and advanced to the the cecum, identified                            by appendiceal orifice and ileocecal valve. The                            colonoscopy was performed without difficulty. The                            patient tolerated the procedure well. The quality                            of the bowel preparation was adequate. Scope In: 10:55:02 AM Scope Out: 11:42:43 AM Scope Withdrawal Time: 0 hours 39 minutes 51 seconds  Total Procedure Duration: 0 hours 47 minutes 41 seconds  Findings:      The perianal and digital rectal examinations were normal.      A 12 mm polyp was found in the cecum. The polyp was flat. An initial       attempt was performed to removed the polyp with cold snare, but the       lateral sides were too flat and could not be grasped adequately. Polyp       was successfully injected with 2 mL Eleview for a lift polypectomy.       Imaging was performed using white light and narrow band imaging to       visualize the mucosa and demarcate the polyp site after injection for       EMR purposes. The polyp was removed with a cold snare. Resection and       retrieval were complete. After completeing the polypectomy, the edges of       the polypectomy site were ablated with the tip of the snare, soft       coagulation settings. To prevent bleeding after the polypectomy, two       hemostatic clips were successfully placed. Clip manufacturer: [employer]. There was no bleeding at the end of the procedure.      A 3 mm polyp was found in the ascending colon. The polyp was sessile.       The polyp was removed with a cold snare. Resection and retrieval were  complete.      A 3 mm polyp was found in the transverse colon. The polyp was flat. Area       was successfully injected with 1 mL Eleview for a lift polypectomy.       Imaging was performed using white light and narrow band imaging to       visualize the mucosa and demarcate the polyp site after  injection for       EMR purposes. The polyp was removed with a cold snare. Resection and       retrieval were complete.      Scattered small-mouthed diverticula were found in the sigmoid colon and       descending colon.      Non-bleeding internal hemorrhoids were found during retroflexion. The       hemorrhoids were small. Impression:               - One 12 mm polyp in the cecum, removed with a cold                            snare. Resected and retrieved via EMR. Clips were                            placed. Clip manufacturer: [employer].                           - One 3 mm polyp in the ascending colon, removed                            with a cold snare. Resected and retrieved.                           - One 3 mm polyp in the transverse colon, removed                            with a cold snare. Resected and retrieved via EMR.                           - Diverticulosis in the sigmoid colon and in the                            descending colon.                           - Non-bleeding internal hemorrhoids. Moderate Sedation:      Per Anesthesia Care Recommendation:           - Discharge patient to home (ambulatory).                           - Resume previous diet.                           - Await pathology results.                           - Repeat colonoscopy in 1 year for surveillance  after piecemeal polypectomy.                           - Restart Plavix in 2 days. Procedure Code(s):        --- Professional ---                           [phone], 59, Colonoscopy, flexible; with removal of                            tumor(s), polyp(s), or other lesion(s) by snare                            technique                           [id number], Colonoscopy, flexible; with directed                            submucosal injection(s), any substance Diagnosis Code(s):        --- Professional ---                           Z86.010, Personal history of colonic  polyps                           D12.0, Benign neoplasm of cecum                           D12.2, Benign neoplasm of ascending colon                           D12.3, Benign neoplasm of transverse colon (hepatic                            flexure or splenic flexure)                           K64.8, Other hemorrhoids                           K57.30, Diverticulosis of large intestine without                            perforation or abscess without bleeding CPT copyright 2022 [employer]. All rights reserved. The codes documented in this report are preliminary and upon coder review may  be revised to meet current compliance requirements. Katrinka Blazing, MD Katrinka Blazing,  10/06/2022 11:51:27 AM This report has been signed electronically. Number of Addenda: 0

## 2022-10-08 ENCOUNTER — Other Ambulatory Visit: Payer: Self-pay | Admitting: Cardiology

## 2022-10-10 LAB — SURGICAL PATHOLOGY

## 2022-10-12 ENCOUNTER — Encounter (HOSPITAL_COMMUNITY): Payer: Self-pay | Admitting: Gastroenterology

## 2022-10-13 NOTE — Progress Notes (Signed)
Reason for follow up: Virtual telephone visit 6 months status post mesenteric angiogram, thrombectomy, and drug coated balloon angioplasty of SMA   History of present illness: Michelle Patton, 67 year old female, has a medical history significant for HTN, anxiety, DVT, PAD (s/p left external iliac stent 2014) and congenital supraaortic valvular stenosis. As a child she underwent aortic repair and as an adult she underwent root/valve replacement (2016).  In March 2022 she developed abdominal pain that gradually worsened and was most acute after eating. A thorough work up by her PCP and GI teams revealed severe, 3 cm proximal SMA stenosis and focal stenosis of the distal celiac trunk. 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 She was referred to Interventional Radiology for treatment options for chronic mesenteric ischemia and I first met with her October 26, 2020.  She was found to be a candidate for possible stent placement and was scheduled for a mesenteric angiogram with possible intervention. On October 29, 2020  she underwent aortic and superior mesenteric arteriograms with stent placement to the superior mesenteric artery. She tolerated the procedure well and was discharged home the same day. She was started on Aspirin and Plavix post-procedure.   The stent improved her pain dramatically and she did well for many months post-procedure.  Follow up imaging February 2023 unfortunately showed stent occlusion and on 07/12/21 she underwent successful SMA stent recanalization with laser atherectomy and placement of a new SMA stent.  I also performed a diagnostic bilateral lower extremity angiogram for evaluation of foot and calf pain/cramping. This study revealed a mild stenosis just proximal to the indwelling left external iliac stent, but otherwise widely patent inflow, outflow, and runoff vessels.   Multiple follow up visits after this second procedure found the patient to be doing quite well but at follow up  visits that Fall she had complaints of non-specific, vague abdominal pain concerning for re-stenosis of the SMA stent. She unfortunately suffered a true anaphylactic reaction from the iodinated contrast after her 10/11/21 CT. She went into shock which required hospitalization. She is no longer able to receive contrast for CT studies so she underwent stent evaluation with ultrasound imaging with findings suggestive of stent occlusion. During her 03/02/22 visit we discussed a repeat angiogram (with CO2) + IVUS versus watchful waiting. We made plans to follow up in one month but she developed worsening abdominal pain and was sent to the ED by GI and was found to have perforated appendicitis. While inpatient during that hospitalization I performed a repeat mesenteric angiogram, thrombectomy and drug-coated balloon angioplasty of the SMA 03/21/22. She was discharged home the next day and has reported minimal abdominal pain at all of our visits since then. She did have complaints about RLQ pain related to her appendix and she received an appendectomy 08/03/22. A repeat mesenteric duplex 08/15/22 showed increased velocities but patency of the SMA stents. She remains compliant with aspirin and plavix.   She presents today via virtual telephone visit for follow up.    Past Medical History:  Diagnosis Date   Aortic regurgitation 07/20/2014   Aortic stenosis, severe 07/20/2014   Arthritis    DVT (deep venous thrombosis) (HCC)    Family history of adverse reaction to anesthesia    Reports father deliurm in his 53's with CABG   GERD (gastroesophageal reflux disease)    History of pneumonia    Hx of repair of rotator cuff    R and L rotator cuff repair   Hyperlipidemia  Hypertension    Insomnia, unspecified    patient stated d/t menopause   Mesenteric ischemia (HCC)    Muscle weakness (generalized)    Peripheral artery disease (HCC)    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  10/07/2014   Redo sternotomy for 21 mm Medtronic Freestyle porcine aortic root graft w/ reimplantation of left main and right coronary arteries   Sleep apnea    diagnosed multiple years ago at [facility] aortic stenosis, congenital - s/p repair during childhood     Past Surgical History:  Procedure Laterality Date   ABDOMINAL AORTAGRAM  06/24/2012   ABDOMINAL AORTAGRAM N/A 06/24/2012   Procedure: ABDOMINAL Ronny Flurry;  Surgeon: Chuck Hint, MD;  Location: [facility] CATH LAB;  Service: Cardiovascular;  Laterality: N/A;   AORTIC VALVE REPLACEMENT N/A 10/07/2014   Procedure: REDO AORTIC VALVE REPLACEMENT (AVR);  Surgeon: Purcell Nails, MD;  Location: [facility];  Service: Open Heart Surgery;  Laterality: N/A;   ASCENDING AORTIC ROOT REPLACEMENT N/A 10/07/2014   Procedure: ASCENDING AORTIC ROOT REPLACEMENT;  Surgeon: Purcell Nails, MD;  Location: [facility] OR;  Service: Open Heart Surgery;  Laterality: N/A;   BIOPSY  09/27/2016   Procedure: BIOPSY;  Surgeon: Corbin Ade, MD;  [facility]: [facility];  Service: Endoscopy;;  colon   BIOPSY  10/18/2020   Procedure: BIOPSY;  Surgeon: Lanelle Bal, DO;  [facility];  Service: Endoscopy;;   BREAST REDUCTION SURGERY Bilateral 01/21/2018   Procedure: BREAST REDUCTION WITH LIPOSUCTION;  Surgeon: Louisa Second, MD;  Location: [facility];  Service: Plastics;  Laterality: Bilateral;   CARDIAC VALVE SURGERY  1968   CARPAL TUNNEL RELEASE Right 03/02/2020   Procedure: CARPAL TUNNEL RELEASE;  Surgeon: Vickki Hearing, MD;  Location: [facility];  Service: Orthopedics;  Laterality: Right;   CATARACT EXTRACTION W/PHACO Left 05/30/2019   Procedure: CATARACT EXTRACTION PHACO AND INTRAOCULAR LENS PLACEMENT (IOC) (CDE: 4.94  );  Surgeon: Fabio Pierce, MD;  Location: [facility];  Service: Ophthalmology;  Laterality: Left;   CATARACT EXTRACTION W/PHACO Right 06/16/2019   Procedure: CATARACT EXTRACTION PHACO AND  INTRAOCULAR LENS PLACEMENT (IOC);  Surgeon: Fabio Pierce, MD;  Location: [facility];  Service: Ophthalmology;  Laterality: Right;  CDE: 4.64   CERVICAL FUSION     CHOLECYSTECTOMY     COLONOSCOPY WITH PROPOFOL N/A 09/27/2016   Dr. Jena Gauss: Diverticulosis, several tubular adenomas removed ranging 4 to 7 mm in size, internal grade 1 hemorrhoids, terminal ileum normal, segmental biopsies negative for microscopic colitis.  Next colonoscopy June 2021   COLONOSCOPY WITH PROPOFOL N/A 05/06/2020   internal hemorrhoids, sigmoid diverticulosis. Surveillance colonoscopy due in 2027   COLONOSCOPY WITH PROPOFOL N/A 10/06/2022   Procedure: COLONOSCOPY WITH PROPOFOL;  Surgeon: Dolores Frame, MD;  [facility];  Service: Gastroenterology;  Laterality: N/A;  10:45AM;ASA 1   ESOPHAGOGASTRODUODENOSCOPY (EGD) WITH PROPOFOL N/A 09/27/2016   Dr. Jena Gauss: Small hiatal hernia, mild Schatzki ring status post disruption, LA grade a esophagitis   ESOPHAGOGASTRODUODENOSCOPY (EGD) WITH PROPOFOL N/A 10/18/2020   non-obstructing mild Schatzki ring, gastritis s/ biopsy. Negative H.pylori.   HEMOSTASIS CLIP PLACEMENT  10/06/2022   Procedure: HEMOSTASIS CLIP PLACEMENT;  Surgeon: Dolores Frame, MD;  [facility];  Service: Gastroenterology;;   ILIAC ARTERY STENT Left 12/2007   IR ANGIOGRAM EXTREMITY BILATERAL  07/12/2021   IR ANGIOGRAM VISCERAL SELECTIVE  10/29/2020   IR ANGIOGRAM VISCERAL SELECTIVE  07/12/2021  IR ANGIOGRAM VISCERAL SELECTIVE  03/21/2022   IR AORTAGRAM ABDOMINAL SERIALOGRAM  03/21/2022   IR IVUS EACH ADDITIONAL NON CORONARY VESSEL  07/12/2021   IR IVUS EACH ADDITIONAL NON CORONARY VESSEL  03/21/2022   IR PTA NON CORO-LOWER EXTREM  03/21/2022   IR RADIOLOGIST EVAL & MGMT  10/26/2020   IR RADIOLOGIST EVAL & MGMT  11/10/2020   IR RADIOLOGIST EVAL & MGMT  02/09/2021   IR RADIOLOGIST EVAL & MGMT  06/16/2021   IR RADIOLOGIST EVAL & MGMT  08/15/2021   IR RADIOLOGIST EVAL & MGMT   10/18/2021   IR RADIOLOGIST EVAL & MGMT  01/11/2022   IR RADIOLOGIST EVAL & MGMT  03/02/2022   IR RADIOLOGIST EVAL & MGMT  05/11/2022   IR RADIOLOGIST EVAL & MGMT  08/18/2022   IR THROMBECT PRIM MECH INIT (INCLU) MOD SED  03/21/2022   IR THROMBECT SEC MECH MOD SED  07/12/2021   IR TRANSCATH PLC STENT 1ST ART NOT LE CV CAR VERT CAR  10/29/2020   IR TRANSCATH PLC STENT 1ST ART NOT LE CV CAR VERT CAR  07/12/2021   IR US GUIDE VASC ACCESS RIGHT  10/29/2020   IR US GUIDE VASC ACCESS RIGHT  07/12/2021   IR US GUIDE VASC ACCESS RIGHT  03/21/2022   KNEE ARTHROSCOPY WITH MEDIAL MENISECTOMY Right 01/10/2018   Procedure: RIGHT KNEE ARTHROSCOPY WITH PARTIAL MEDIAL MENISECTOMY;  Surgeon: Vickki Hearing, MD;  Location: [facility];  Service: Orthopedics;  Laterality: Right;   LAPAROSCOPIC APPENDECTOMY N/A 08/03/2022   Procedure: APPENDECTOMY LAPAROSCOPIC;  Surgeon: Axel Filler, MD;  Location: [facility] OR;  Service: General;  Laterality: N/A;   LEFT AND RIGHT HEART CATHETERIZATION WITH CORONARY ANGIOGRAM N/A 07/31/2014   Procedure: LEFT AND RIGHT HEART CATHETERIZATION WITH CORONARY ANGIOGRAM;  Surgeon: Kathleene Hazel, MD;  Location: [facility] CATH LAB;  Service: Cardiovascular;  Laterality: N/A;   MALONEY DILATION N/A 09/27/2016   Procedure: Elease Hashimoto DILATION;  Surgeon: Corbin Ade, MD;  [facility];  Service: Endoscopy;  Laterality: N/A;   POLYPECTOMY  09/27/2016   Procedure: POLYPECTOMY;  Surgeon: Corbin Ade, MD;  [facility];  Service: Endoscopy;;  colon   POLYPECTOMY  10/06/2022   Procedure: POLYPECTOMY;  Surgeon: Dolores Frame, MD;  [facility];  Service: Gastroenterology;;   ROTATOR CUFF REPAIR Bilateral    SUBMUCOSAL LIFTING INJECTION  10/06/2022   Procedure: SUBMUCOSAL LIFTING INJECTION;  Surgeon: Dolores Frame, MD;  [facility];  Service: Gastroenterology;;   TEE WITHOUT CARDIOVERSION N/A 07/07/2014   Procedure:  TRANSESOPHAGEAL ECHOCARDIOGRAM (TEE);  Surgeon: Antoine Poche, MD;  [facility];  Service: Cardiology;  Laterality: N/A;   TEE WITHOUT CARDIOVERSION N/A 07/20/2014   Procedure: TRANSESOPHAGEAL ECHOCARDIOGRAM (TEE) WITH PROPOFOL;  Surgeon: Antoine Poche, MD;  Location: [facility];  Service: Endoscopy;  Laterality: N/A;   TEE WITHOUT CARDIOVERSION N/A 10/07/2014   Procedure: TRANSESOPHAGEAL ECHOCARDIOGRAM (TEE);  Surgeon: Purcell Nails, MD;  Location: [facility] OR;  Service: Open Heart Surgery;  Laterality: N/A;    Allergies: Iodinated contrast media and Sulfa antibiotics  Medications: Prior to Admission medications   Medication Sig Start Date End Date Taking? Authorizing Provider  varenicline (CHANTIX) 0.5 MG tablet Take 0.5 po Qd for three days,then take 0.5 po bid for 4 days Patient not taking: Reported on 10/03/2022 08/28/22   Dolores Frame, MD  acetaminophen (TYLENOL) 325 MG tablet Take 650 mg by mouth every 6 (six) hours as  needed for mild pain, moderate pain, fever or headache.    [provider]  aspirin EC 81 MG tablet Take 1 tablet (81 mg total) by mouth daily. Swallow whole. 12/16/19   Antoine Poche, MD  Cholecalciferol (VITAMIN D-3 PO) Take 1 capsule by mouth in the morning and at bedtime.    [provider]  clopidogrel (PLAVIX) 75 MG tablet Take 1 tablet (75 mg total) by mouth daily. Patient taking differently: Take 75 mg by mouth every evening. 07/17/22   Shade Flood, MD  estradiol (ESTRACE) 2 MG tablet Take 1 tablet (2 mg total) by mouth daily. 01/19/21   Shade Flood, MD  ezetimibe (ZETIA) 10 MG tablet Take 1 tablet (10 mg total) by mouth daily. 01/13/22 01/08/23  Dunn, Tacey Ruiz, PA-C  Flaxseed, Linseed, (FLAX SEED OIL PO) Take 1 capsule by mouth in the morning.    [provider]  hydrocortisone (ANUSOL-HC) 2.5 % rectal cream Place 1 Application rectally 2 (two) times daily. As needed for rectal bleeding. Patient taking  differently: Place 1 Application rectally 2 (two) times daily as needed for hemorrhoids (rectal bleeding). 12/06/21   Gelene Mink, NP  losartan (COZAAR) 25 MG tablet TAKE 1 TABLET BY MOUTH DAILY 04/13/22   Shade Flood, MD  metoprolol tartrate (LOPRESSOR) 25 MG tablet TAKE 1 TABLET TWICE A DAY 02/24/22   Antoine Poche, MD  Multiple Minerals (CALCIUM-MAGNESIUM-ZINC) TABS Take 1 tablet by mouth in the morning.    [provider]  Multiple Vitamin (MULTIVITAMIN WITH MINERALS) TABS tablet Take 1 tablet by mouth in the morning.    [provider]  NP THYROID 120 MG tablet Take 60 mg by mouth daily before breakfast. 01/11/22   [provider]  progesterone (PROMETRIUM) 200 MG capsule Take 2 capsules (400 mg total) by mouth daily. Patient taking differently: Take 400 mg by mouth at bedtime. 01/19/21 10/03/23  Shade Flood, MD  RABEprazole (ACIPHEX) 20 MG tablet Take 1 tablet (20 mg total) by mouth 2 (two) times daily before a meal. 02/27/22   Aida Raider, NP  rosuvastatin (CRESTOR) 40 MG tablet TAKE 1 TABLET BY MOUTH DAILY 10/09/22   Antoine Poche, MD  triamcinolone ointment (KENALOG) 0.1 % Apply 1 Application topically 2 (two) times daily as needed. Patient taking differently: Apply 1 Application topically 2 (two) times daily as needed (irritation). 11/25/21   Alfonse Spruce, MD  varenicline (CHANTIX CONTINUING MONTH PAK) 1 MG tablet Take 1 tablet (1 mg total) by mouth 2 (two) times daily. 08/28/22   Dolores Frame, MD     Family History  Problem Relation Age of Onset   Hypertension Mother    Hypertension Father    Heart disease Father        before age 92   Other Father        varicose veins   COPD Father    Colon cancer Neg Hx    Celiac disease Neg Hx    Inflammatory bowel disease Neg Hx     Social History   Socioeconomic History   Marital status: Married    Spouse name: Raschie   Number of children: 0   Years of education:  some coll   Highest education level: Some college, no degree  Occupational History   Occupation: Financial planner: [employer]    Comment: [employer]  Tobacco Use   Smoking status: Former    Packs/day: 0.50  Years: 40.00    Additional pack years: 0.00    Total pack years: 20.00    Types: Cigarettes    Quit date: 05/2021    Years since quitting: 1.3   Smokeless tobacco: Never  Vaping Use   Vaping Use: Never used  Substance and Sexual Activity   Alcohol use: Yes    Alcohol/week: 2.0 standard drinks of alcohol    Types: 2 Cans of beer per week   Drug use: No   Sexual activity: Yes    Birth control/protection: None  Other Topics Concern   Not on file  Social History Narrative   Patient is married   for 5 years . Patient works at [employer].. 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   Social Determinants of Health   Financial Resource Strain: Low Risk  (07/13/2022)   Overall Financial Resource Strain (CARDIA)    Difficulty of Paying Living Expenses: Not hard at all  Food Insecurity: No Food Insecurity (07/13/2022)   Hunger Vital Sign    Worried About Running Out of Food in the Last Year: Never true    Ran Out of Food in the Last Year: Never true  Transportation Needs: No Transportation Needs (07/13/2022)   PRAPARE - Administrator, Civil Service (Medical): No    Lack of Transportation (Non-Medical): No  Physical Activity: Unknown (07/13/2022)   Exercise Vital Sign    Days of Exercise per Week: Patient declined    Minutes of Exercise per Session: Not on file  Stress: Stress Concern Present (07/13/2022)   [employer] of Occupational Health - Occupational Stress Questionnaire    Feeling of Stress : To some extent  Social Connections: Socially Isolated (07/13/2022)   Social Connection and Isolation Panel [NHANES]    Frequency of Communication with Friends and Family: Once a week    Frequency of Social Gatherings with  Friends and Family: Once a week    Attends Religious Services: Never    Database administrator or Organizations: No    Attends Engineer, structural: Not on file    Marital Status: Married     Vital Signs: There were no vitals taken for this visit.  No physical exam was performed in lieu of virtual visit.   Imaging: ABI (06/03/21--> 11/25/21) R = 1.11 --> 1.07 L = 0.93 --> 1.02     CTA AP (06/09/21)  Interval occlusion of SMA stent, with proximal native reconstitution.  Prominent Arc of Riolan.  Unchanged mild/moderate celiac stenosis, widely patent IMA.     CTA AP (07/15/21)  Recanalized and relined SMA stent, widely patent.   Angiogram (07/12/21)  Mild proximal left EIA stenosis and mild in-stent restenosis.     10/11/21  Patent SMA stent   US Carotid 01/13/22 IMPRESSION: Color duplex indicates minimal heterogeneous plaque, with no hemodynamically significant stenosis by duplex criteria in the extracranial cerebrovascular circulation.    [place] Mesenteric 01/13/22 MPRESSION: Directed duplex of the mesenteric vasculature demonstrates patent stent with questionable mid stent stenosis versus artifact.  Celiac artery not evaluated given the poor sonographic window.   [place] Mesenteric 04/21/22 IMPRESSION: 1. Elevated peak systolic velocity in the proximal and mid aspect of the SMA may reflect turbulence related to the presence of the stent. 2. The stent remains patent. 3. Celiac axis is largely obscured by bowel gas.    [place] Mesenteric 08/15/22 IMPRESSION: Increasing peak systolic velocity throughout the stented segment of the SMA concerning for possible recurrent  in stent stenosis. The vascular channel was not particularly well visualized in stenosis cannot be visually confirmed.   Labs:  CBC: Recent Labs    03/21/22 0452 03/27/22 1126 04/05/22 1205 07/26/22 1600  WBC 10.6* 12.2* 10.7* 9.8  HGB 10.0* 11.2* 11.4* 12.8  HCT 30.2* 33.4* 33.2* 41.2  PLT 471*  622.0* 535.0* 295    COAGS: Recent Labs    03/14/22 2020  INR 1.1    BMP: Recent Labs    03/18/22 0733 03/19/22 0420 03/20/22 0421 03/21/22 0651 03/27/22 1126 07/18/22 0929  NA 135 135 133* 136 138 140  K 3.4* 3.7 3.1* 3.5 3.6 4.5  CL 102 103 101 101 100 102  CO2 21* 23 23 24 29 23   GLUCOSE 93 85 114* 158* 94 104*  BUN <5* <5* <5* <5* 10 10  CALCIUM 8.1* 7.9* 7.9* 8.4* 8.9 9.7  CREATININE 0.82 0.87 0.72 0.70 0.91 1.04*  GFRNONAA >60 >60 >60 >60  --   --     LIVER FUNCTION TESTS: Recent Labs    02/20/22 1626 03/14/22 2020 03/15/22 0605 07/18/22 0929  BILITOT 0.3 0.6 0.4 0.3  AST 16 14* 14* 19  ALT 13 15 14 18   ALKPHOS 70 78 68 70  PROT 7.0 7.8 6.5 7.1  ALBUMIN 4.1 3.4* 2.8* 4.2    Assessment and Plan:  68 year old female with history of atherosclerotic disease including peripheral artery disease and chronic mesenteric ischemia now status post superior mesenteric artery covered stent placement on 10/29/20 and stent recanalization with re-lining on 07/12/21.   She was hospitalized in late November 2023 for acute appendicitis with thrombosed SMA stent.  She underwent mesenteric angiogram (CO2 + IVUS) and SMA thrombectomy with DCB angioplasty of the previously placed stents on 03/21/22.    She is now status post recent appendectomy.  Recent mesenteric duplex shows increased velocities but patency of the SMA stents.  She has some mild left lower quadrant pain of uncertain etiology, but no post-prandial abdominal pain.  Electronically Signed: Mickie Kay 10/13/2022, 1:34 PM   I spent a total of 25 Minutes in virtual clinical consultation, greater than 50% of which was counseling/coordinating care for chronic mesenteric ischemia.

## 2022-10-17 ENCOUNTER — Encounter (INDEPENDENT_AMBULATORY_CARE_PROVIDER_SITE_OTHER): Payer: Self-pay | Admitting: *Deleted

## 2022-10-17 LAB — HM COLONOSCOPY

## 2022-10-18 ENCOUNTER — Ambulatory Visit
Admission: RE | Admit: 2022-10-18 | Discharge: 2022-10-18 | Disposition: A | Discharge status: Home / Self Care | Payer: Medicare Other | Source: Ambulatory Visit | Attending: Interventional Radiology | Admitting: Interventional Radiology

## 2022-10-18 DIAGNOSIS — K551 Chronic vascular disorders of intestine: Secondary | ICD-10-CM

## 2022-10-18 HISTORY — PX: IR RADIOLOGIST EVAL & MGMT: IMG5224

## 2022-10-24 ENCOUNTER — Other Ambulatory Visit (INDEPENDENT_AMBULATORY_CARE_PROVIDER_SITE_OTHER): Payer: Self-pay | Admitting: Gastroenterology

## 2022-10-24 ENCOUNTER — Telehealth (INDEPENDENT_AMBULATORY_CARE_PROVIDER_SITE_OTHER): Payer: Self-pay | Admitting: *Deleted

## 2022-10-24 DIAGNOSIS — F17200 Nicotine dependence, unspecified, uncomplicated: Secondary | ICD-10-CM

## 2022-10-24 MED ORDER — VARENICLINE TARTRATE 1 MG PO TABS: 1.0000 mg | ORAL_TABLET | Freq: Two times a day (BID) | ORAL | 2 refills | Status: DC

## 2022-10-24 NOTE — Telephone Encounter (Signed)
Prescription sent to pharmacy.

## 2022-10-24 NOTE — Telephone Encounter (Signed)
Fax from [facility] on [address] requesting refill on varenicline 1 mg tablets. One bid. I did not see med on medlist but saw in note from 08/28/22 to start chantix. Called pt and she staets she is taking med. She has taken for 8 weeks she said and said she was told to take for 12 weeks. She has stopped smoking but still having cravings. Would like to continue med.   [facility].

## 2022-10-25 NOTE — Telephone Encounter (Signed)
Pt aware rx was sent to pharmacy 

## 2022-12-02 ENCOUNTER — Other Ambulatory Visit: Payer: Self-pay | Admitting: Physician Assistant

## 2022-12-07 ENCOUNTER — Ambulatory Visit
Admission: EM | Admit: 2022-12-07 | Discharge: 2022-12-07 | Disposition: A | Discharge status: Home / Self Care | Payer: Medicare Other | Attending: Family Medicine | Admitting: Family Medicine

## 2022-12-07 ENCOUNTER — Telehealth: Payer: Self-pay | Admitting: Family Medicine

## 2022-12-07 ENCOUNTER — Encounter: Payer: Self-pay | Admitting: Emergency Medicine

## 2022-12-07 ENCOUNTER — Encounter (INDEPENDENT_AMBULATORY_CARE_PROVIDER_SITE_OTHER): Payer: Self-pay

## 2022-12-07 ENCOUNTER — Other Ambulatory Visit: Payer: Self-pay

## 2022-12-07 DIAGNOSIS — U071 COVID-19: Secondary | ICD-10-CM

## 2022-12-07 MED ORDER — MOLNUPIRAVIR EUA 200MG CAPSULE: 4.0000 | ORAL_CAPSULE | Freq: Two times a day (BID) | ORAL | 0 refills | Status: AC

## 2022-12-07 MED ORDER — FLUTICASONE PROPIONATE 50 MCG/ACT NA SUSP: 1.0000 | Freq: Two times a day (BID) | NASAL | 2 refills | Status: AC

## 2022-12-07 NOTE — Telephone Encounter (Signed)
Caller name: Sanja Ossman  On DPR?: Yes  Call back number: [phone] (home)  Provider they see: Shade Flood, MD  Reason for call:   Pt called Covid + took 2 tests. CHF pt can't take Paxlovid sent her to triage.

## 2022-12-07 NOTE — Telephone Encounter (Signed)
Patient Name First: Michelle Hole Last: Patton Gender: Female DOB: November 27, 1955 Age: 67 Y 9 M 20 D Return Phone Number: [phone] (Primary), [phone] (Secondary) [place] [address] Client [facility] [place] Client Site [facility] [place] [facility] [facility] Provider Janeece Agee- NP Contact Type Call Who Is Calling Patient / Member / Family / Caregiver Call Type Triage / Clinical Relationship To Patient Self Return Phone Number [phone] (Primary) Chief Complaint Headache Reason for Call Symptomatic / Request for Health Information Initial Comment Caller states she has CHF and tested positive for COVID. She's up all night and has congestion, headache, and earache. Additional Comment Unable to take Paxlovid GOTO Facility Not Listed [facility] No Nurse Assessment Nurse: Lily Kocher, RN, Adriana Date/Time (Eastern Time): 12/07/2022 9:50:48 AM Confirm and document reason for call. If symptomatic, describe symptoms. ---pt is covid +. headache, sinus and ear pain, sore throat, runny nose and congestion. denies fevers. sx onset tuesday Does the patient have any new or worsening symptoms? ---Yes Will a triage be completed? ---Yes Related visit to physician within the last 2 weeks? ---No Does the PT have any chronic conditions? (i.e. diabetes, asthma, this includes High risk factors for pregnancy, etc.) ---Yes List chronic conditions. ---artificial heat valve mesenteric ischemia htn high cholesterol chf Is this a behavioral health or substance abuse call? ---No Guidelines Guideline Title Affirmed Question Affirmed Notes Nurse Date/Time (Eastern Time) COVID-19 - Diagnosed or Suspected SEVERE or constant chest pain or pressure (Exception: Mild central chest pain, Lily Kocher, RN, Adriana 12/07/2022 9:54:05 AM PLEASE NOTE: All timestamps contained within this report are represented as [place]  Standard Time. CONFIDENTIALTY NOTICE: This fax transmission is intended only for the addressee. It contains information that is legally privileged, confidential or otherwise protected from use or disclosure. If you are not the intended recipient, you are strictly prohibited from reviewing, disclosing, copying using or disseminating any of this information or taking any action in reliance on or regarding this information. If you have received this fax in error, please notify us immediately by telephone so that we can arrange for its return to [place]. Phone: [phone], Toll-Free: [phone], Fax: [phone] Page: 2 of 2 Call Id: [id number] Guidelines Guideline Title Affirmed Question Affirmed Notes Nurse Date/Time Lamount Cohen Time) present only when coughing.) Disp. Time Lamount Cohen Time) Disposition Final User 12/07/2022 9:57:13 AM Go to ED Now Yes Lily Kocher, RN, Adriana Final Disposition 12/07/2022 9:57:13 AM Go to ED Now Yes Lily Kocher, RN, Adriana Caller Disagree/Comply Comply Caller Understands Yes PreDisposition Call Doctor Care Advice Given Per Guideline GO TO ED NOW: CARE ADVICE given per COVID-19 - DIAGNOSED OR SUSPECTED (Adult) guideline. ANOTHER ADULT SHOULD DRIVE: * It is better and safer if another adult drives instead of you. Referrals GO TO FACILITY OTHER - SPECIFY  Call pt tomorrow morning and follow-up with symptoms

## 2022-12-07 NOTE — ED Provider Notes (Signed)
[facility]    CSN: [id number] Arrival date & time: 12/07/22  1105      History   Chief Complaint Chief Complaint  Patient presents with   Headache    HPI Michelle Patton is a 67 y.o. female.   Today with 1 day history of headache, sore throat, sinus drainage, ear pressure, fatigue.  Denies chest pain, shortness of breath, abdominal pain, nausea vomiting or diarrhea.  Home COVID test was positive x 2 this morning.  So far trying over-the-counter pain relievers with no relief.    Past Medical History:  Diagnosis Date   Aortic regurgitation 07/20/2014   Aortic stenosis, severe 07/20/2014   Arthritis    DVT (deep venous thrombosis) (HCC)    Family history of adverse reaction to anesthesia    Reports father deliurm in his 29's with CABG   GERD (gastroesophageal reflux disease)    History of pneumonia    Hx of repair of rotator cuff    R and L rotator cuff repair   Hyperlipidemia    Hypertension    Insomnia, unspecified    patient stated d/t menopause   Mesenteric ischemia (HCC)    Muscle weakness (generalized)    Peripheral artery disease (HCC)    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   Redo sternotomy for 21 mm Medtronic Freestyle porcine aortic root graft w/ reimplantation of left main and right coronary arteries   Sleep apnea    diagnosed multiple years ago at [facility] aortic stenosis, congenital - s/p repair during childhood     Patient Active Problem List   Diagnosis Date Noted   Tobacco use disorder 08/28/2022   Leukocytosis 03/15/2022   Mixed hyperlipidemia 03/15/2022   Acquired hypothyroidism 03/15/2022   Hypoalbuminemia due to protein-calorie malnutrition (HCC) 03/15/2022   Body mass index (BMI) 34.0-34.9, adult 03/15/2022   Acute perforated appendicitis 03/14/2022   Allergy, drug 01/13/2022   Melena 12/06/2021   Prolapsed internal hemorrhoids, grade 3 12/06/2021   Left lower  quadrant abdominal pain 12/06/2021   Acute anaphylaxis 10/11/2021   Mesenteric ischemia (HCC) 07/12/2021   Chronic mesenteric ischemia (HCC) 02/22/2021   Elevated LFTs 10/05/2020   Class 2 severe obesity due to excess calories with serious comorbidity and body mass index (BMI) of 36.0 to 36.9 in adult [facility]) 08/09/2020   PAD (peripheral artery disease) (HCC) 08/09/2020   Aortic valve prosthesis present 08/09/2020   Chronic systolic congestive heart failure (HCC) 08/09/2020   OSA and COPD overlap syndrome (HCC) 08/09/2020   s/p right carpal tunnel release 03/02/20 04/06/2020   Closed displaced fracture of proximal phalanx of lesser toe of right foot 12/31/2019   Carpal tunnel syndrome of right wrist 12/05/2019   Nondisplaced fracture of fifth right metatarsal bone with routine healing 10/21/2019   Skin lesion 04/11/2019   Low back pain without sciatica 01/20/2019   Pain of upper abdomen 01/14/2019   Fatigue 12/09/2018   Breast cancer screening 12/09/2018   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 left wrist 10/23/2018   S/P right knee arthroscopy 01/10/18 01/18/2018   Chondromalacia, patella, right    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    Personal history of colonic polyps 12/28/2016   Abdominal pain, epigastric 08/10/2016   Left sided abdominal pain 08/10/2016   Esophageal dysphagia 08/10/2016   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   Supravalvular aortic stenosis, congenital -  s/p repair during childhood    Essential hypertension    GERD (gastroesophageal reflux disease)    Aortic stenosis, severe 07/20/2014   Aortic regurgitation 07/20/2014   Leg cramps 09/26/2012   Occlusion and stenosis of carotid artery without mention of cerebral infarction 07/10/2012   Peripheral vascular disease, unspecified (HCC) 06/12/2012   Carotid artery bruit 06/12/2012    Past Surgical History:  Procedure Laterality  Date   ABDOMINAL AORTAGRAM  06/24/2012   ABDOMINAL AORTAGRAM N/A 06/24/2012   Procedure: ABDOMINAL Ronny Flurry;  Surgeon: Chuck Hint, MD;  Location: [facility] CATH LAB;  Service: Cardiovascular;  Laterality: N/A;   AORTIC VALVE REPLACEMENT N/A 10/07/2014   Procedure: REDO AORTIC VALVE REPLACEMENT (AVR);  Surgeon: Purcell Nails, MD;  Location: [facility] OR;  Service: Open Heart Surgery;  Laterality: N/A;   ASCENDING AORTIC ROOT REPLACEMENT N/A 10/07/2014   Procedure: ASCENDING AORTIC ROOT REPLACEMENT;  Surgeon: Purcell Nails, MD;  Location: [facility] OR;  Service: Open Heart Surgery;  Laterality: N/A;   BIOPSY  09/27/2016   Procedure: BIOPSY;  Surgeon: Corbin Ade, MD;  Location: [facility];  Service: Endoscopy;;  colon   BIOPSY  10/18/2020   Procedure: BIOPSY;  Surgeon: Lanelle Bal, DO;  Location: [facility];  Service: Endoscopy;;   BREAST REDUCTION SURGERY Bilateral 01/21/2018   Procedure: BREAST REDUCTION WITH LIPOSUCTION;  Surgeon: Louisa Second, MD;  Location: [facility];  Service: Plastics;  Laterality: Bilateral;   CARDIAC VALVE SURGERY  1968   CARPAL TUNNEL RELEASE Right 03/02/2020   Procedure: CARPAL TUNNEL RELEASE;  Surgeon: Vickki Hearing, MD;  Location: [facility] ORS;  Service: Orthopedics;  Laterality: Right;   CATARACT EXTRACTION W/PHACO Left 05/30/2019   Procedure: CATARACT EXTRACTION PHACO AND INTRAOCULAR LENS PLACEMENT (IOC) (CDE: 4.94  );  Surgeon: Fabio Pierce, MD;  Location: [facility] ORS;  Service: Ophthalmology;  Laterality: Left;   CATARACT EXTRACTION W/PHACO Right 06/16/2019   Procedure: CATARACT EXTRACTION PHACO AND INTRAOCULAR LENS PLACEMENT (IOC);  Surgeon: Fabio Pierce, MD;  Location: [facility]S;  Service: Ophthalmology;  Laterality: Right;  CDE: 4.64   CERVICAL FUSION     CHOLECYSTECTOMY     COLONOSCOPY WITH PROPOFOL N/A 09/27/2016   Dr. Jena Gauss: Diverticulosis, several tubular adenomas removed ranging 4 to 7 mm in size, internal grade 1  hemorrhoids, terminal ileum normal, segmental biopsies negative for microscopic colitis.  Next colonoscopy June 2021   COLONOSCOPY WITH PROPOFOL N/A 05/06/2020   internal hemorrhoids, sigmoid diverticulosis. Surveillance colonoscopy due in 2027   COLONOSCOPY WITH PROPOFOL N/A 10/06/2022   Procedure: COLONOSCOPY WITH PROPOFOL;  Surgeon: Dolores Frame, MD;  [facility];  Service: Gastroenterology;  Laterality: N/A;  10:45AM;ASA 1   ESOPHAGOGASTRODUODENOSCOPY (EGD) WITH PROPOFOL N/A 09/27/2016   Dr. Jena Gauss: Small hiatal hernia, mild Schatzki ring status post disruption, LA grade a esophagitis   ESOPHAGOGASTRODUODENOSCOPY (EGD) WITH PROPOFOL N/A 10/18/2020   non-obstructing mild Schatzki ring, gastritis s/ biopsy. Negative H.pylori.   HEMOSTASIS CLIP PLACEMENT  10/06/2022   Procedure: HEMOSTASIS CLIP PLACEMENT;  Surgeon: Dolores Frame, MD;  [facility];  Service: Gastroenterology;;   ILIAC ARTERY STENT Left 12/2007   IR ANGIOGRAM EXTREMITY BILATERAL  07/12/2021   IR ANGIOGRAM VISCERAL SELECTIVE  10/29/2020   IR ANGIOGRAM VISCERAL SELECTIVE  07/12/2021   IR ANGIOGRAM VISCERAL SELECTIVE  03/21/2022   IR AORTAGRAM ABDOMINAL SERIALOGRAM  03/21/2022   IR IVUS EACH ADDITIONAL NON CORONARY VESSEL  07/12/2021   IR IVUS EACH ADDITIONAL NON  CORONARY VESSEL  03/21/2022   IR PTA NON CORO-LOWER EXTREM  03/21/2022   IR RADIOLOGIST EVAL & MGMT  10/26/2020   IR RADIOLOGIST EVAL & MGMT  11/10/2020   IR RADIOLOGIST EVAL & MGMT  02/09/2021   IR RADIOLOGIST EVAL & MGMT  06/16/2021   IR RADIOLOGIST EVAL & MGMT  08/15/2021   IR RADIOLOGIST EVAL & MGMT  10/18/2021   IR RADIOLOGIST EVAL & MGMT  01/11/2022   IR RADIOLOGIST EVAL & MGMT  03/02/2022   IR RADIOLOGIST EVAL & MGMT  05/11/2022   IR RADIOLOGIST EVAL & MGMT  08/18/2022   IR RADIOLOGIST EVAL & MGMT  10/18/2022   IR THROMBECT PRIM MECH INIT (INCLU) MOD SED  03/21/2022   IR THROMBECT SEC MECH MOD SED  07/12/2021   IR  TRANSCATH PLC STENT 1ST ART NOT LE CV CAR VERT CAR  10/29/2020   IR TRANSCATH PLC STENT 1ST ART NOT LE CV CAR VERT CAR  07/12/2021   IR US GUIDE VASC ACCESS RIGHT  10/29/2020   IR US GUIDE VASC ACCESS RIGHT  07/12/2021   IR US GUIDE VASC ACCESS RIGHT  03/21/2022   KNEE ARTHROSCOPY WITH MEDIAL MENISECTOMY Right 01/10/2018   Procedure: RIGHT KNEE ARTHROSCOPY WITH PARTIAL MEDIAL MENISECTOMY;  Surgeon: Vickki Hearing, MD;  Location: [facility];  Service: Orthopedics;  Laterality: Right;   LAPAROSCOPIC APPENDECTOMY N/A 08/03/2022   Procedure: APPENDECTOMY LAPAROSCOPIC;  Surgeon: Axel Filler, MD;  Location: [facility] OR;  Service: General;  Laterality: N/A;   LEFT AND RIGHT HEART CATHETERIZATION WITH CORONARY ANGIOGRAM N/A 07/31/2014   Procedure: LEFT AND RIGHT HEART CATHETERIZATION WITH CORONARY ANGIOGRAM;  Surgeon: Kathleene Hazel, MD;  Location[facility] CATH LAB;  Service: Cardiovascular;  Laterality: N/A;   MALONEY DILATION N/A 09/27/2016   Procedure: Elease Hashimoto DILATION;  Surgeon: Corbin Ade, MD;  [facility];  Service: Endoscopy;  Laterality: N/A;   POLYPECTOMY  09/27/2016   Procedure: POLYPECTOMY;  Surgeon: Corbin Ade, MD;  [facility];  Service: Endoscopy;;  colon   POLYPECTOMY  10/06/2022   Procedure: POLYPECTOMY;  Surgeon: Dolores Frame, MD;  [facility];  Service: Gastroenterology;;   ROTATOR CUFF REPAIR Bilateral    SUBMUCOSAL LIFTING INJECTION  10/06/2022   Procedure: SUBMUCOSAL LIFTING INJECTION;  Surgeon: Dolores Frame, MD;  [facility];  Service: Gastroenterology;;   TEE WITHOUT CARDIOVERSION N/A 07/07/2014   Procedure: TRANSESOPHAGEAL ECHOCARDIOGRAM (TEE);  Surgeon: Antoine Poche, MD;  [facility];  Service: Cardiology;  Laterality: N/A;   TEE WITHOUT CARDIOVERSION N/A 07/20/2014   Procedure: TRANSESOPHAGEAL ECHOCARDIOGRAM (TEE) WITH PROPOFOL;  Surgeon: Antoine Poche, MD;  Location: [facility];  Service: Endoscopy;  Laterality: N/A;   TEE WITHOUT CARDIOVERSION N/A 10/07/2014   Procedure: TRANSESOPHAGEAL ECHOCARDIOGRAM (TEE);  Surgeon: Purcell Nails, MD;  Location: [facility] OR;  Service: Open Heart Surgery;  Laterality: N/A;    OB History   No obstetric history on file.      Home Medications    Prior to Admission medications   Medication Sig Start Date End Date Taking? Authorizing Provider  fluticasone (FLONASE) 50 MCG/ACT nasal spray Place 1 spray into both nostrils 2 (two) times daily. 12/07/22  Yes Particia Nearing, PA-C  molnupiravir EUA (LAGEVRIO) 200 mg CAPS capsule Take 4 capsules (800 mg total) by mouth 2 (two) times daily for 5 days. 12/07/22 12/12/22 Yes Particia Nearing, PA-C  acetaminophen (TYLENOL) 325 MG tablet Take 650  mg by mouth every 6 (six) hours as needed for mild pain, moderate pain, fever or headache.    [provider]  aspirin EC 81 MG tablet Take 1 tablet (81 mg total) by mouth daily. Swallow whole. 12/16/19   Antoine Poche, MD  Cholecalciferol (VITAMIN D-3 PO) Take 1 capsule by mouth in the morning and at bedtime.    [provider]  clopidogrel (PLAVIX) 75 MG tablet Take 1 tablet (75 mg total) by mouth daily. Patient taking differently: Take 75 mg by mouth every evening. 07/17/22   Shade Flood, MD  estradiol (ESTRACE) 2 MG tablet Take 1 tablet (2 mg total) by mouth daily. 01/19/21   Shade Flood, MD  ezetimibe (ZETIA) 10 MG tablet TAKE 1 TABLET BY MOUTH DAILY 12/05/22   Antoine Poche, MD  Flaxseed, Linseed, (FLAX SEED OIL PO) Take 1 capsule by mouth in the morning.    [provider]  hydrocortisone (ANUSOL-HC) 2.5 % rectal cream Place 1 Application rectally 2 (two) times daily. As needed for rectal bleeding. Patient taking differently: Place 1 Application rectally 2 (two) times daily as needed for hemorrhoids (rectal bleeding). 12/06/21   Gelene Mink, NP  losartan (COZAAR) 25 MG tablet TAKE 1 TABLET  BY MOUTH DAILY 04/13/22   Shade Flood, MD  metoprolol tartrate (LOPRESSOR) 25 MG tablet TAKE 1 TABLET TWICE A DAY 02/24/22   Antoine Poche, MD  Multiple Minerals (CALCIUM-MAGNESIUM-ZINC) TABS Take 1 tablet by mouth in the morning.    [provider]  Multiple Vitamin (MULTIVITAMIN WITH MINERALS) TABS tablet Take 1 tablet by mouth in the morning.    [provider]  NP THYROID 120 MG tablet Take 60 mg by mouth daily before breakfast. 01/11/22   [provider]  progesterone (PROMETRIUM) 200 MG capsule Take 2 capsules (400 mg total) by mouth daily. Patient taking differently: Take 400 mg by mouth at bedtime. 01/19/21 10/03/23  Shade Flood, MD  RABEprazole (ACIPHEX) 20 MG tablet Take 1 tablet (20 mg total) by mouth 2 (two) times daily before a meal. 02/27/22   Aida Raider, NP  rosuvastatin (CRESTOR) 40 MG tablet TAKE 1 TABLET BY MOUTH DAILY 10/09/22   Antoine Poche, MD  triamcinolone ointment (KENALOG) 0.1 % Apply 1 Application topically 2 (two) times daily as needed. Patient taking differently: Apply 1 Application topically 2 (two) times daily as needed (irritation). 11/25/21   Alfonse Spruce, MD  varenicline (CHANTIX CONTINUING MONTH PAK) 1 MG tablet Take 1 tablet (1 mg total) by mouth 2 (two) times daily. 10/24/22   Dolores Frame, MD    Family History Family History  Problem Relation Age of Onset   Hypertension Mother    Hypertension Father    Heart disease Father        before age 65   Other Father        varicose veins   COPD Father    Colon cancer Neg Hx    Celiac disease Neg Hx    Inflammatory bowel disease Neg Hx     Social History Social History   Tobacco Use   Smoking status: Former    Current packs/day: 0.00    Average packs/day: 0.5 packs/day for 40.0 years (20.0 ttl pk-yrs)    Types: Cigarettes    Start date: 05/1981    Quit date: 05/2021    Years since quitting: 1.5   Smokeless tobacco: Never  Vaping  Use  Vaping status: Never Used  Substance Use Topics   Alcohol use: Yes    Alcohol/week: 2.0 standard drinks of alcohol    Types: 2 Cans of beer per week   Drug use: No     Allergies   Iodinated contrast media and Sulfa antibiotics   Review of Systems Review of Systems Per HPI  Physical Exam Triage Vital Signs ED Triage Vitals  Encounter Vitals Group     BP 12/07/22 1119 139/84     Systolic BP Percentile --      Diastolic BP Percentile --      Pulse Rate 12/07/22 1119 84     Resp 12/07/22 1119 20     Temp 12/07/22 1119 98.4 F (36.9 C)     Temp Source 12/07/22 1119 Oral     SpO2 12/07/22 1119 95 %     Weight --      Height --      Head Circumference --      Peak Flow --      Pain Score 12/07/22 1117 6     Pain Loc --      Pain Education --      Exclude from Growth Chart --    No data found.  Updated Vital Signs BP 139/84 (BP Location: Right Arm)   Pulse 84   Temp 98.4 F (36.9 C) (Oral)   Resp 20   SpO2 95%   Visual Acuity Right Eye Distance:   Left Eye Distance:   Bilateral Distance:    Right Eye Near:   Left Eye Near:    Bilateral Near:     Physical Exam Vitals and nursing note reviewed.  Constitutional:      Appearance: Normal appearance.  HENT:     Head: Atraumatic.     Right Ear: Tympanic membrane and external ear normal.     Left Ear: Tympanic membrane and external ear normal.     Ears:     Comments: Mild bilateral middle ear effusion    Nose: Rhinorrhea present.     Mouth/Throat:     Mouth: Mucous membranes are moist.     Pharynx: Posterior oropharyngeal erythema present.  Eyes:     Extraocular Movements: Extraocular movements intact.     Conjunctiva/sclera: Conjunctivae normal.  Cardiovascular:     Rate and Rhythm: Normal rate and regular rhythm.     Heart sounds: Normal heart sounds.  Pulmonary:     Effort: Pulmonary effort is normal.     Breath sounds: Normal breath sounds. No wheezing.  Musculoskeletal:        General:  Normal range of motion.     Cervical back: Normal range of motion and neck supple.  Skin:    General: Skin is warm and dry.  Neurological:     Mental Status: She is alert and oriented to person, place, and time.  Psychiatric:        Mood and Affect: Mood normal.        Thought Content: Thought content normal.      UC Treatments / Results  Labs (all labs ordered are listed, but only abnormal results are displayed) Labs Reviewed - No data to display  EKG   Radiology No results found.  Procedures Procedures (including critical care time)  Medications Ordered in UC Medications - No data to display  Initial Impression / Assessment and Plan / UC Course  I have reviewed the triage vital signs and the nursing notes.  Pertinent labs &  imaging results that were available during my care of the patient were reviewed by me and considered in my medical decision making (see chart for details).     Treat with molnupiravir, Flonase, supportive over-the-counter medications and home care.  Return for worsening symptoms. Final Clinical Impressions(s) / UC Diagnoses   Final diagnoses:  COVID-19   Discharge Instructions   None    ED Prescriptions     Medication Sig Dispense Auth. Provider   molnupiravir EUA (LAGEVRIO) 200 mg CAPS capsule Take 4 capsules (800 mg total) by mouth 2 (two) times daily for 5 days. 40 capsule Celsa, [place], PA-C   fluticasone [facility]) 50 MCG/ACT nasal spray Place 1 spray into both nostrils 2 (two) times daily. 16 g Particia Nearing, [place]      PDMP not reviewed this encounter.   [place], [place], [place] 12/07/22 1206

## 2022-12-07 NOTE — ED Triage Notes (Signed)
Pt reports tested positive for covid this am. Pt reports headache, nasal congestion, ear pain x1 week with worsening within last several days. Has tried allergy medication with no change in symptoms.

## 2022-12-08 ENCOUNTER — Telehealth: Payer: Self-pay | Admitting: Family Medicine

## 2022-12-08 NOTE — Telephone Encounter (Signed)
Pt is doing about the same possibly worse, not been able to pick up medication at this time husband is working on picking now, painful cough

## 2022-12-08 NOTE — Telephone Encounter (Signed)
FYI: Due to hospital follow up available times and patients schedule I had to schedule for Monday 12/18/22

## 2022-12-08 NOTE — Telephone Encounter (Signed)
Called patient, no answer left a message to call back.

## 2022-12-08 NOTE — Telephone Encounter (Signed)
Pt called back. °

## 2022-12-08 NOTE — Telephone Encounter (Signed)
Pt went to urgent care/ ED today which is why I was having trouble scheduling Hospital follow up but if you prefer I can see about a virtual to check in

## 2022-12-08 NOTE — Telephone Encounter (Signed)
Noted.  I am hoping she will start to feel better once she starts meds.  If she is having worsening symptoms over the weekend I do recommend that she seek care through urgent care or ER.  I am fine seeing her in acute visit or at minimum virtual visit next week if we can make that work.  Thanks.

## 2022-12-18 ENCOUNTER — Telehealth (INDEPENDENT_AMBULATORY_CARE_PROVIDER_SITE_OTHER): Payer: Medicare Other | Admitting: Family Medicine

## 2022-12-18 ENCOUNTER — Encounter: Payer: Self-pay | Admitting: Family Medicine

## 2022-12-18 DIAGNOSIS — U071 COVID-19: Secondary | ICD-10-CM

## 2022-12-18 NOTE — Telephone Encounter (Signed)
Upon review of the 4:00 appt today with Dr. Neva Seat and reading through the previous phone note, I called patient to see how she was feeling and offer her a virtual visit instead. She stated that she was feeling much better and that a virtual visit would be fine since she would be rushing to get here because she gets off at 330. I have let Dr. Neva Seat and Sheryle Hail know.

## 2022-12-18 NOTE — Progress Notes (Unsigned)
Virtual Visit via Video Note  I connected with Michelle Patton on 12/18/22 at 4:19 PM by a video enabled telemedicine application and verified that I am speaking with the correct person using two identifiers.  Patient location: home, by self.  My location: office - [place].    I discussed the limitations, risks, security and privacy concerns of performing an evaluation and management service by telephone and the availability of in person appointments. I also discussed with the patient that there may be a patient responsible charge related to this service. The patient expressed understanding and agreed to proceed, consent obtained  Chief complaint:  Chief Complaint  Patient presents with   Covid Positive    Pt was seen in Urgent Care on Thursday  Positive for COVID  States she is improving  States she had a  rough 6 days but now she is improving  Had a terrible headache , earache, sore throat , no voice     History of Present Illness: Michelle Patton is a 67 y.o. female  Covid 41 infection: Chart reviewed. Urgent care visit on 12/07/2022.  At that time 1 day history of headache, sore throat, sinus drainage, ear pressure and fatigue.  Positive home COVID test that day, no relief with home pain relievers.  Vitals noted with temp 98.4, O2 sat 95%.  Treated with molnupiravir, Flonase, supportive with over-the-counter medications and home care. Felt worse than prior covid infection. Lost voice for a week, earaches, congestion, sore throat - lasted 6 days. Unable to take molnupiravir until next day 8/16. Fluticasone did help. Stayed hydrated, appetite gone, but better now and starting to eat more now.  Overall improving - still some fatigue. No fever. Tight feeling in chest lying down, but no pain, no dyspnea.  Improving. Day 11.      Patient Active Problem List   Diagnosis Date Noted   Tobacco use disorder 08/28/2022   Leukocytosis 03/15/2022   Mixed hyperlipidemia  03/15/2022   Acquired hypothyroidism 03/15/2022   Hypoalbuminemia due to protein-calorie malnutrition (HCC) 03/15/2022   Body mass index (BMI) 34.0-34.9, adult 03/15/2022   Acute perforated appendicitis 03/14/2022   Allergy, drug 01/13/2022   Melena 12/06/2021   Prolapsed internal hemorrhoids, grade 3 12/06/2021   Left lower quadrant abdominal pain 12/06/2021   Acute anaphylaxis 10/11/2021   Mesenteric ischemia (HCC) 07/12/2021   Chronic mesenteric ischemia (HCC) 02/22/2021   Elevated LFTs 10/05/2020   Class 2 severe obesity due to excess calories with serious comorbidity and body mass index (BMI) of 36.0 to 36.9 in adult [facility]) 08/09/2020   PAD (peripheral artery disease) (HCC) 08/09/2020   Aortic valve prosthesis present 08/09/2020   Chronic systolic congestive heart failure (HCC) 08/09/2020   OSA and COPD overlap syndrome (HCC) 08/09/2020   s/p right carpal tunnel release 03/02/20 04/06/2020   Closed displaced fracture of proximal phalanx of lesser toe of right foot 12/31/2019   Carpal tunnel syndrome of right wrist 12/05/2019   Nondisplaced fracture of fifth right metatarsal bone with routine healing 10/21/2019   Skin lesion 04/11/2019   Low back pain without sciatica 01/20/2019   Pain of upper abdomen 01/14/2019   Fatigue 12/09/2018   Breast cancer screening 12/09/2018   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 left wrist 10/23/2018   S/P right knee arthroscopy 01/10/18 01/18/2018   Chondromalacia, patella, right    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    Personal history of colonic polyps 12/28/2016   Abdominal  pain, epigastric 08/10/2016   Left sided abdominal pain 08/10/2016   Esophageal dysphagia 08/10/2016   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   Supravalvular aortic stenosis, congenital - s/p repair during childhood    Essential hypertension    GERD (gastroesophageal reflux disease)     Aortic stenosis, severe 07/20/2014   Aortic regurgitation 07/20/2014   Leg cramps 09/26/2012   Occlusion and stenosis of carotid artery without mention of cerebral infarction 07/10/2012   Peripheral vascular disease, unspecified (HCC) 06/12/2012   Carotid artery bruit 06/12/2012   Past Medical History:  Diagnosis Date   Aortic regurgitation 07/20/2014   Aortic stenosis, severe 07/20/2014   Arthritis    DVT (deep venous thrombosis) (HCC)    Family history of adverse reaction to anesthesia    Reports father deliurm in his 5's with CABG   GERD (gastroesophageal reflux disease)    History of pneumonia    Hx of repair of rotator cuff    R and L rotator cuff repair   Hyperlipidemia    Hypertension    Insomnia, unspecified    patient stated d/t menopause   Mesenteric ischemia (HCC)    Muscle weakness (generalized)    Peripheral artery disease (HCC)    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 aortic stenosis, congenital - s/p repair during childhood    Past Surgical History:  Procedure Laterality Date   ABDOMINAL AORTAGRAM  06/24/2012   ABDOMINAL AORTAGRAM N/A 06/24/2012   Procedure: ABDOMINAL Ronny Flurry;  Surgeon: Chuck Hint, MD;  Location: [facility] CATH LAB;  Service: Cardiovascular;  Laterality: N/A;   AORTIC VALVE REPLACEMENT N/A 10/07/2014   Procedure: REDO AORTIC VALVE REPLACEMENT (AVR);  Surgeon: Purcell Nails, MD;  Location: [facility] OR;  Service: Open Heart Surgery;  Laterality: N/A;   ASCENDING AORTIC ROOT REPLACEMENT N/A 10/07/2014   Procedure: ASCENDING AORTIC ROOT REPLACEMENT;  Surgeon: Purcell Nails, MD;  Location: [facility] OR;  Service: Open Heart Surgery;  Laterality: N/A;   BIOPSY  09/27/2016   Procedure: BIOPSY;  Surgeon:  Corbin Ade, MD;  Location: [facility] ENDO [facility];  Service: Endoscopy;;  colon   BIOPSY  10/18/2020   Procedure: BIOPSY;  Surgeon: Lanelle Bal, DO;  Location: [facility];  Service: Endoscopy;;   BREAST REDUCTION SURGERY Bilateral 01/21/2018   Procedure: BREAST REDUCTION WITH LIPOSUCTION;  Surgeon: Louisa Second, MD;  Location: [facility];  Service: Plastics;  Laterality: Bilateral;   CARDIAC VALVE SURGERY  1968   CARPAL TUNNEL RELEASE Right 03/02/2020   Procedure: CARPAL TUNNEL RELEASE;  Surgeon: Vickki Hearing, MD;  Location: [facility] ORS;  Service: Orthopedics;  Laterality: Right;   CATARACT EXTRACTION W/PHACO Left 05/30/2019   Procedure: CATARACT EXTRACTION PHACO AND INTRAOCULAR LENS PLACEMENT (IOC) (CDE: 4.94  );  Surgeon: Fabio Pierce, MD;  Location: [facility] ORS;  Service: Ophthalmology;  Laterality: Left;   CATARACT EXTRACTION W/PHACO Right 06/16/2019   Procedure: CATARACT EXTRACTION PHACO AND INTRAOCULAR LENS PLACEMENT (IOC);  Surgeon: Fabio Pierce, MD;  Location: [facility] ORS;  Service: Ophthalmology;  Laterality: Right;  CDE: 4.64   CERVICAL FUSION     CHOLECYSTECTOMY     COLONOSCOPY WITH PROPOFOL N/A 09/27/2016   Dr. Jena Gauss: Diverticulosis, several  tubular adenomas removed ranging 4 to 7 mm in size, internal grade 1 hemorrhoids, terminal ileum normal, segmental biopsies negative for microscopic colitis.  Next colonoscopy June 2021   COLONOSCOPY WITH PROPOFOL N/A 05/06/2020   internal hemorrhoids, sigmoid diverticulosis. Surveillance colonoscopy due in 2027   COLONOSCOPY WITH PROPOFOL N/A 10/06/2022   Procedure: COLONOSCOPY WITH PROPOFOL;  Surgeon: Dolores Frame, MD;  [facility];  Service: Gastroenterology;  Laterality: N/A;  10:45AM;ASA 1   ESOPHAGOGASTRODUODENOSCOPY (EGD) WITH PROPOFOL N/A 09/27/2016   Dr. Jena Gauss: Small hiatal hernia, mild Schatzki ring status post disruption, LA grade a esophagitis   ESOPHAGOGASTRODUODENOSCOPY (EGD) WITH  PROPOFOL N/A 10/18/2020   non-obstructing mild Schatzki ring, gastritis s/ biopsy. Negative H.pylori.   HEMOSTASIS CLIP PLACEMENT  10/06/2022   Procedure: HEMOSTASIS CLIP PLACEMENT;  Surgeon: Dolores Frame, MD;  [facility];  Service: Gastroenterology;;   ILIAC ARTERY STENT Left 12/2007   IR ANGIOGRAM EXTREMITY BILATERAL  07/12/2021   IR ANGIOGRAM VISCERAL SELECTIVE  10/29/2020   IR ANGIOGRAM VISCERAL SELECTIVE  07/12/2021   IR ANGIOGRAM VISCERAL SELECTIVE  03/21/2022   IR AORTAGRAM ABDOMINAL SERIALOGRAM  03/21/2022   IR IVUS EACH ADDITIONAL NON CORONARY VESSEL  07/12/2021   IR IVUS EACH ADDITIONAL NON CORONARY VESSEL  03/21/2022   IR PTA NON CORO-LOWER EXTREM  03/21/2022   IR RADIOLOGIST EVAL & MGMT  10/26/2020   IR RADIOLOGIST EVAL & MGMT  11/10/2020   IR RADIOLOGIST EVAL & MGMT  02/09/2021   IR RADIOLOGIST EVAL & MGMT  06/16/2021   IR RADIOLOGIST EVAL & MGMT  08/15/2021   IR RADIOLOGIST EVAL & MGMT  10/18/2021   IR RADIOLOGIST EVAL & MGMT  01/11/2022   IR RADIOLOGIST EVAL & MGMT  03/02/2022   IR RADIOLOGIST EVAL & MGMT  05/11/2022   IR RADIOLOGIST EVAL & MGMT  08/18/2022   IR RADIOLOGIST EVAL & MGMT  10/18/2022   IR THROMBECT PRIM MECH INIT (INCLU) MOD SED  03/21/2022   IR THROMBECT SEC MECH MOD SED  07/12/2021   IR TRANSCATH PLC STENT 1ST ART NOT LE CV CAR VERT CAR  10/29/2020   IR TRANSCATH PLC STENT 1ST ART NOT LE CV CAR VERT CAR  07/12/2021   IR US GUIDE VASC ACCESS RIGHT  10/29/2020   IR US GUIDE VASC ACCESS RIGHT  07/12/2021   IR US GUIDE VASC ACCESS RIGHT  03/21/2022   KNEE ARTHROSCOPY WITH MEDIAL MENISECTOMY Right 01/10/2018   Procedure: RIGHT KNEE ARTHROSCOPY WITH PARTIAL MEDIAL MENISECTOMY;  Surgeon: Vickki Hearing, MD;  Location: [facility];  Service: Orthopedics;  Laterality: Right;   LAPAROSCOPIC APPENDECTOMY N/A 08/03/2022   Procedure: APPENDECTOMY LAPAROSCOPIC;  Surgeon: Axel Filler, MD;  Location: [facility] OR;  Service: General;  Laterality: N/A;   LEFT  AND RIGHT HEART CATHETERIZATION WITH CORONARY ANGIOGRAM N/A 07/31/2014   Procedure: LEFT AND RIGHT HEART CATHETERIZATION WITH CORONARY ANGIOGRAM;  Surgeon: Kathleene Hazel, MD;  Location: [facility] CATH LAB;  Service: Cardiovascular;  Laterality: N/A;   MALONEY DILATION N/A 09/27/2016   Procedure: Elease Hashimoto DILATION;  Surgeon: Corbin Ade, MD;  [facility];  Service: Endoscopy;  Laterality: N/A;   POLYPECTOMY  09/27/2016   Procedure: POLYPECTOMY;  Surgeon: Corbin Ade, MD;  [facility];  Service: Endoscopy;;  colon   POLYPECTOMY  10/06/2022   Procedure: POLYPECTOMY;  Surgeon: Dolores Frame, MD;  [facility];  Service: Gastroenterology;;   ROTATOR CUFF REPAIR Bilateral    SUBMUCOSAL  LIFTING INJECTION  10/06/2022   Procedure: SUBMUCOSAL LIFTING INJECTION;  Surgeon: Dolores Frame, MD;  [facility];  Service: Gastroenterology;;   TEE WITHOUT CARDIOVERSION N/A 07/07/2014   Procedure: TRANSESOPHAGEAL ECHOCARDIOGRAM (TEE);  Surgeon: Antoine Poche, MD;  [facility];  Service: Cardiology;  Laterality: N/A;   TEE WITHOUT CARDIOVERSION N/A 07/20/2014   Procedure: TRANSESOPHAGEAL ECHOCARDIOGRAM (TEE) WITH PROPOFOL;  Surgeon: Antoine Poche, MD;  Location: [facility];  Service: Endoscopy;  Laterality: N/A;   TEE WITHOUT CARDIOVERSION N/A 10/07/2014   Procedure: TRANSESOPHAGEAL ECHOCARDIOGRAM (TEE);  Surgeon: Purcell Nails, MD;  Location: [facility] OR;  Service: Open Heart Surgery;  Laterality: N/A;   Allergies  Allergen Reactions   Iodinated Contrast Media Anaphylaxis, Shortness Of Breath, Itching, Swelling and Other (See Comments)    Patient was given Omni 350 and suffered from itching, chest pain and shortness of breath. Patient was taken to ED after onset of reaction. 10/11/2021  MD zackowski noted pt had tongue and throat swelling, had to give pt epinephrine    Sulfa Antibiotics Nausea And Vomiting   Prior to  Admission medications   Medication Sig Start Date End Date Taking? Authorizing Provider  acetaminophen (TYLENOL) 325 MG tablet Take 650 mg by mouth every 6 (six) hours as needed for mild pain, moderate pain, fever or headache.   Yes [provider]  aspirin EC 81 MG tablet Take 1 tablet (81 mg total) by mouth daily. Swallow whole. 12/16/19  Yes BranchDorothe Pea, MD  Cholecalciferol (VITAMIN D-3 PO) Take 1 capsule by mouth in the morning and at bedtime.   Yes [provider]  clopidogrel (PLAVIX) 75 MG tablet Take 1 tablet (75 mg total) by mouth daily. Patient taking differently: Take 75 mg by mouth every evening. 07/17/22  Yes Shade Flood, MD  estradiol (ESTRACE) 2 MG tablet Take 1 tablet (2 mg total) by mouth daily. 01/19/21  Yes Shade Flood, MD  ezetimibe (ZETIA) 10 MG tablet TAKE 1 TABLET BY MOUTH DAILY 12/05/22  Yes Branch, Dorothe Pea, MD  Flaxseed, Linseed, (FLAX SEED OIL PO) Take 1 capsule by mouth in the morning.   Yes [provider]  fluticasone (FLONASE) 50 MCG/ACT nasal spray Place 1 spray into both nostrils 2 (two) times daily. 12/07/22  Yes Particia Nearing, PA-C  hydrocortisone (ANUSOL-HC) 2.5 % rectal cream Place 1 Application rectally 2 (two) times daily. As needed for rectal bleeding. Patient taking differently: Place 1 Application rectally 2 (two) times daily as needed for hemorrhoids (rectal bleeding). 12/06/21  Yes Gelene Mink, NP  losartan (COZAAR) 25 MG tablet TAKE 1 TABLET BY MOUTH DAILY 04/13/22  Yes Shade Flood, MD  metoprolol tartrate (LOPRESSOR) 25 MG tablet TAKE 1 TABLET TWICE A DAY 02/24/22  Yes Branch, Dorothe Pea, MD  Multiple Minerals (CALCIUM-MAGNESIUM-ZINC) TABS Take 1 tablet by mouth in the morning.   Yes [provider]  NP THYROID 120 MG tablet Take 60 mg by mouth daily before breakfast. 01/11/22  Yes [provider]  progesterone (PROMETRIUM) 200 MG capsule Take 2 capsules (400 mg total) by mouth  daily. Patient taking differently: Take 400 mg by mouth at bedtime. 01/19/21 10/03/23 Yes Shade Flood, MD  RABEprazole (ACIPHEX) 20 MG tablet Take 1 tablet (20 mg total) by mouth 2 (two) times daily before a meal. 02/27/22  Yes Mahon, Courtney L, NP  rosuvastatin (CRESTOR) 40 MG tablet TAKE 1 TABLET BY MOUTH DAILY 10/09/22  Yes Branch,  Dorothe Pea, MD  triamcinolone ointment (KENALOG) 0.1 % Apply 1 Application topically 2 (two) times daily as needed. Patient taking differently: Apply 1 Application topically 2 (two) times daily as needed (irritation). 11/25/21  Yes Alfonse Spruce, MD  varenicline (CHANTIX CONTINUING MONTH PAK) 1 MG tablet Take 1 tablet (1 mg total) by mouth 2 (two) times daily. 10/24/22  Yes Dolores Frame, MD   Social History   Socioeconomic History   Marital status: Married    Spouse name: Raschie   Number of children: 0   Years of education: some coll   Highest education level: Some college, no degree  Occupational History   Occupation: Financial planner: [employer]    Comment: [employer]  Tobacco Use   Smoking status: Former    Current packs/day: 0.00    Average packs/day: 0.5 packs/day for 40.0 years (20.0 ttl pk-yrs)    Types: Cigarettes    Start date: 05/1981    Quit date: 05/2021    Years since quitting: 1.5   Smokeless tobacco: Never  Vaping Use   Vaping status: Never Used  Substance and Sexual Activity   Alcohol use: Yes    Alcohol/week: 2.0 standard drinks of alcohol    Types: 2 Cans of beer per week   Drug use: No   Sexual activity: Yes    Birth control/protection: None  Other Topics Concern   Not on file  Social History Narrative   Patient is married   for 5 years . Patient works at [employer].. 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   Social Determinants of Health   Financial Resource Strain: Low Risk  (07/13/2022)   Overall Financial Resource Strain (CARDIA)    Difficulty of Paying  Living Expenses: Not hard at all  Food Insecurity: No Food Insecurity (07/13/2022)   Hunger Vital Sign    Worried About Running Out of Food in the Last Year: Never true    Ran Out of Food in the Last Year: Never true  Transportation Needs: No Transportation Needs (07/13/2022)   PRAPARE - Administrator, Civil Service (Medical): No    Lack of Transportation (Non-Medical): No  Physical Activity: Unknown (07/13/2022)   Exercise Vital Sign    Days of Exercise per Week: Patient declined    Minutes of Exercise per Session: Not on file  Stress: Stress Concern Present (07/13/2022)   [employer] of Occupational Health - Occupational Stress Questionnaire    Feeling of Stress : To some extent  Social Connections: Socially Isolated (07/13/2022)   Social Connection and Isolation Panel [NHANES]    Frequency of Communication with Friends and Family: Once a week    Frequency of Social Gatherings with Friends and Family: Once a week    Attends Religious Services: Never    Database administrator or Organizations: No    Attends Engineer, structural: Not on file    Marital Status: Married  Catering manager Violence: Not At Risk (03/22/2022)   Humiliation, Afraid, Rape, and Kick questionnaire    Fear of Current or Ex-Partner: No    Emotionally Abused: No    Physically Abused: No    Sexually Abused: No    Observations/Objective: There were no vitals filed for this visit. Nontoxic appearance on video, speaking in full sentences without respiratory distress.  Appropriate responses, coherent responses, all questions answered with understanding of plan expressed.  Assessment and Plan: COVID-19  -Improving.  Minimal residual  symptoms, home treatment, RTC precautions/ER precautions if acute worsening symptoms.  No med changes or change in plan at this time.  Follow Up Instructions: There are no Patient Instructions on file for this visit.    I discussed the assessment and  treatment plan with the patient. The patient was provided an opportunity to ask questions and all were answered. The patient agreed with the plan and demonstrated an understanding of the instructions.   The patient was advised to call back or seek an in-person evaluation if the symptoms worsen or if the condition fails to improve as anticipated.   Shade Flood, MD

## 2023-01-04 ENCOUNTER — Encounter: Payer: Self-pay | Admitting: Cardiology

## 2023-01-04 ENCOUNTER — Ambulatory Visit: Payer: Medicare Other | Attending: Cardiology | Admitting: Cardiology

## 2023-01-04 VITALS — BP 152/82 | HR 82 | Ht 65.0 in | Wt 228.0 lb

## 2023-01-04 DIAGNOSIS — Z952 Presence of prosthetic heart valve: Secondary | ICD-10-CM | POA: Diagnosis not present

## 2023-01-04 DIAGNOSIS — E782 Mixed hyperlipidemia: Secondary | ICD-10-CM

## 2023-01-04 DIAGNOSIS — I10 Essential (primary) hypertension: Secondary | ICD-10-CM

## 2023-01-04 NOTE — Patient Instructions (Signed)

## 2023-01-04 NOTE — Progress Notes (Signed)
Clinical Summary Michelle Patton is a 67 y.o.female seen today for follow up of the following medicla problems   1. Aortic stenosis -  hx in 1968 at age of 86 of supravavlular aortic stenosis, corrective surgery with patching at that time.   - found to have developed severe aortic valvular stenosis with possible recurrence of supravalvular stenosis as well based on imaging. 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   - Medtronic Freestyle stentless porcine valve/aortic root graft (size 21 mm, model # 995, serial # [id number])       09/2017 echo LVEF 65-70%, no WMAS, grade I diastolic dysfunction, normal AVR.   -12/2021 echo: LVEF 65-70%, 21 mm Medtronic Freestyle stentless porcine valve. Peak and  mean gradients through the valve are 13 and 5 mm Hg respectively.. The  aortic valve has been repaired/replaced. Aortic valve regurgitation is not  visualized.    -  no SOB/DOE, no chest pains.        2. PAD - followed by vascular, history of left external iliac stent.   - no recent smptoms.      3. HTN - she is compliant iwht meds  - cough on ACE-I, discontinued - reported some insomina initialyl with losartan but unclear if truly related     - compliant with meds   4. 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  - she is on ASA/plavix per IR. She reports was told need lifelong plavix due to multiple SMA stents over time.         6. OSA - moderate OSA by 08/2020 - cpap machine was expensive.    7. COVID + 12/07/22     SH: Her husband Raschie Meinecke is also a patient of mine. 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      Trip to [place] to visit her mother. Born and raised in [place],moved to [place]. Father passed in February.  Enjoys riding motorcycles Past Medical History:  Diagnosis Date   Aortic regurgitation 07/20/2014   Aortic stenosis, severe 07/20/2014   Arthritis    DVT (deep venous thrombosis) (HCC)    Family history of adverse reaction to anesthesia    Reports father deliurm in his 35's with CABG   GERD (gastroesophageal reflux disease)    History of pneumonia    Hx of repair of rotator cuff    R and L rotator cuff repair   Hyperlipidemia    Hypertension    Insomnia, unspecified    patient stated d/t menopause   Mesenteric ischemia (HCC)    Muscle weakness (generalized)    Peripheral artery disease (HCC)    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   Redo sternotomy for 21 mm Medtronic Freestyle porcine aortic root graft w/ reimplantation of left main and right coronary arteries   Sleep apnea    diagnosed multiple years ago at [facility] aortic  stenosis, congenital - s/p repair during childhood      Allergies  Allergen Reactions   Iodinated Contrast Media Anaphylaxis, Shortness Of Breath, Itching, Swelling and Other (See Comments)    Patient was given Omni 350 and suffered from itching, chest pain and shortness of breath. Patient was taken to ED after onset of reaction. 10/11/2021  MD zackowski noted pt had tongue and throat swelling, had to give pt epinephrine    Sulfa Antibiotics Nausea And Vomiting     Current Outpatient Medications  Medication Sig Dispense Refill   acetaminophen (TYLENOL) 325 MG tablet Take 650 mg by mouth every 6 (six) hours as needed for mild pain, moderate pain, fever or headache.     aspirin EC 81 MG tablet Take 1 tablet (81 mg total) by mouth daily. Swallow whole. 90 tablet 3    Cholecalciferol (VITAMIN D-3 PO) Take 1 capsule by mouth in the morning and at bedtime.     clopidogrel (PLAVIX) 75 MG tablet Take 1 tablet (75 mg total) by mouth daily. (Patient taking differently: Take 75 mg by mouth every evening.) 90 tablet 5   estradiol (ESTRACE) 2 MG tablet Take 1 tablet (2 mg total) by mouth daily. 30 tablet 3   ezetimibe (ZETIA) 10 MG tablet TAKE 1 TABLET BY MOUTH DAILY 90 tablet 3   Flaxseed, Linseed, (FLAX SEED OIL PO) Take 1 capsule by mouth in the morning.     fluticasone (FLONASE) 50 MCG/ACT nasal spray Place 1 spray into both nostrils 2 (two) times daily. 16 g 2   hydrocortisone (ANUSOL-HC) 2.5 % rectal cream Place 1 Application rectally 2 (two) times daily. As needed for rectal bleeding. (Patient taking differently: Place 1 Application rectally 2 (two) times daily as needed for hemorrhoids (rectal bleeding).) 30 g 1   losartan (COZAAR) 25 MG tablet TAKE 1 TABLET BY MOUTH DAILY 90 tablet 3   metoprolol tartrate (LOPRESSOR) 25 MG tablet TAKE 1 TABLET TWICE A DAY 180 tablet 3   Multiple Minerals (CALCIUM-MAGNESIUM-ZINC) TABS Take 1 tablet by mouth in the morning.     NP THYROID 120 MG tablet Take 60 mg by mouth daily before breakfast.     progesterone (PROMETRIUM) 200 MG capsule Take 2 capsules (400 mg total) by mouth daily. (Patient taking differently: Take 400 mg by mouth at bedtime.) 180 capsule 1   RABEprazole (ACIPHEX) 20 MG tablet Take 1 tablet (20 mg total) by mouth 2 (two) times daily before a meal. 180 tablet 3   rosuvastatin (CRESTOR) 40 MG tablet TAKE 1 TABLET BY MOUTH DAILY 90 tablet 3   triamcinolone ointment (KENALOG) 0.1 % Apply 1 Application topically 2 (two) times daily as needed. (Patient taking differently: Apply 1 Application topically 2 (two) times daily as needed (irritation).) 454 g 0   varenicline (CHANTIX CONTINUING MONTH PAK) 1 MG tablet Take 1 tablet (1 mg total) by mouth 2 (two) times daily. 180 tablet 2   No current facility-administered  medications for this visit.     Past Surgical History:  Procedure Laterality Date   ABDOMINAL AORTAGRAM  06/24/2012   ABDOMINAL AORTAGRAM N/A 06/24/2012   Procedure: ABDOMINAL Ronny Flurry;  Surgeon: Chuck Hint, MD;  Location[facility] C[facility] LAB;  Service: Cardiovascular;  Laterality: N/A;   AORTIC VALVE REPLACEMENT N/A 10/07/2014   Procedure: REDO AORTIC VALVE REPLACEMENT (AVR);  Surgeon: Purcell Nails, MD;  Location: [facility] OR;  Service: Open Heart Surgery;  Laterality: N/A;   ASCENDING AORTIC ROOT REPLACEMENT N/A 10/07/2014  Procedure: ASCENDING AORTIC ROOT REPLACEMENT;  Surgeon: Purcell Nails, MD;  [facility] OR;  Service: Open Heart Surgery;  Laterality: N/A;   BIOPSY  09/27/2016   Procedure: BIOPSY;  Surgeon: Corbin Ade, MD;  [facility];  Service: Endoscopy;;  colon   BIOPSY  10/18/2020   Procedure: BIOPSY;  Surgeon: Lanelle Bal, DO;  [facility];  Service: Endoscopy;;   BREAST REDUCTION SURGERY Bilateral 01/21/2018   Procedure: BREAST REDUCTION WITH LIPOSUCTION;  Surgeon: Louisa Second, MD;  Location: [facility];  Service: Plastics;  Laterality: Bilateral;   CARDIAC VALVE SURGERY  1968   CARPAL TUNNEL RELEASE Right 03/02/2020   Procedure: CARPAL TUNNEL RELEASE;  Surgeon: Vickki Hearing, MD;  Location: [facility]S;  Service: Orthopedics;  Laterality: Right;   CATARACT EXTRACTION W/PHACO Left 05/30/2019   Procedure: CATARACT EXTRACTION PHACO AND INTRAOCULAR LENS PLACEMENT (IOC) (CDE: 4.94  );  Surgeon: Fabio Pierce, MD;  Location: [facility];  Service: Ophthalmology;  Laterality: Left;   CATARACT EXTRACTION W/PHACO Right 06/16/2019   Procedure: CATARACT EXTRACTION PHACO AND INTRAOCULAR LENS PLACEMENT (IOC);  Surgeon: Fabio Pierce, MD;  Location: [facility];  Service: Ophthalmology;  Laterality: Right;  CDE: 4.64   CERVICAL FUSION     CHOLECYSTECTOMY     COLONOSCOPY WITH PROPOFOL N/A 09/27/2016   Dr. Jena Gauss: Diverticulosis,  several tubular adenomas removed ranging 4 to 7 mm in size, internal grade 1 hemorrhoids, terminal ileum normal, segmental biopsies negative for microscopic colitis.  Next colonoscopy June 2021   COLONOSCOPY WITH PROPOFOL N/A 05/06/2020   internal hemorrhoids, sigmoid diverticulosis. Surveillance colonoscopy due in 2027   COLONOSCOPY WITH PROPOFOL N/A 10/06/2022   Procedure: COLONOSCOPY WITH PROPOFOL;  Surgeon: Dolores Frame, MD;  [facility];  Service: Gastroenterology;  Laterality: N/A;  10:45AM;ASA 1   ESOPHAGOGASTRODUODENOSCOPY (EGD) WITH PROPOFOL N/A 09/27/2016   Dr. Jena Gauss: Small hiatal hernia, mild Schatzki ring status post disruption, LA grade a esophagitis   ESOPHAGOGASTRODUODENOSCOPY (EGD) WITH PROPOFOL N/A 10/18/2020   non-obstructing mild Schatzki ring, gastritis s/ biopsy. Negative H.pylori.   HEMOSTASIS CLIP PLACEMENT  10/06/2022   Procedure: HEMOSTASIS CLIP PLACEMENT;  Surgeon: Dolores Frame, MD;  [facility];  Service: Gastroenterology;;   ILIAC ARTERY STENT Left 12/2007   IR ANGIOGRAM EXTREMITY BILATERAL  07/12/2021   IR ANGIOGRAM VISCERAL SELECTIVE  10/29/2020   IR ANGIOGRAM VISCERAL SELECTIVE  07/12/2021   IR ANGIOGRAM VISCERAL SELECTIVE  03/21/2022   IR AORTAGRAM ABDOMINAL SERIALOGRAM  03/21/2022   IR IVUS EACH ADDITIONAL NON CORONARY VESSEL  07/12/2021   IR IVUS EACH ADDITIONAL NON CORONARY VESSEL  03/21/2022   IR PTA NON CORO-LOWER EXTREM  03/21/2022   IR RADIOLOGIST EVAL & MGMT  10/26/2020   IR RADIOLOGIST EVAL & MGMT  11/10/2020   IR RADIOLOGIST EVAL & MGMT  02/09/2021   IR RADIOLOGIST EVAL & MGMT  06/16/2021   IR RADIOLOGIST EVAL & MGMT  08/15/2021   IR RADIOLOGIST EVAL & MGMT  10/18/2021   IR RADIOLOGIST EVAL & MGMT  01/11/2022   IR RADIOLOGIST EVAL & MGMT  03/02/2022   IR RADIOLOGIST EVAL & MGMT  05/11/2022   IR RADIOLOGIST EVAL & MGMT  08/18/2022   IR RADIOLOGIST EVAL & MGMT  10/18/2022   IR THROMBECT PRIM MECH INIT (INCLU)  MOD SED  03/21/2022   IR THROMBECT SEC MECH MOD SED  07/12/2021   IR TRANSCATH PLC STENT 1ST ART NOT LE CV CAR VERT CAR  10/29/2020   IR TRANSCATH PLC STENT 1ST ART NOT LE CV CAR VERT CAR  07/12/2021   IR US GUIDE VASC ACCESS RIGHT  10/29/2020   IR US GUIDE VASC ACCESS RIGHT  07/12/2021   IR US GUIDE VASC ACCESS RIGHT  03/21/2022   KNEE ARTHROSCOPY WITH MEDIAL MENISECTOMY Right 01/10/2018   Procedure: RIGHT KNEE ARTHROSCOPY WITH PARTIAL MEDIAL MENISECTOMY;  Surgeon: Vickki Hearing, MD;  Location: [facility];  Service: Orthopedics;  Laterality: Right;   LAPAROSCOPIC APPENDECTOMY N/A 08/03/2022   Procedure: APPENDECTOMY LAPAROSCOPIC;  Surgeon: Axel Filler, MD;  Location: [facility] OR;  Service: General;  Laterality: N/A;   LEFT AND RIGHT HEART CATHETERIZATION WITH CORONARY ANGIOGRAM N/A 07/31/2014   Procedure: LEFT AND RIGHT HEART CATHETERIZATION WITH CORONARY ANGIOGRAM;  Surgeon: Kathleene Hazel, MD;  Location: [facility] CATH LAB;  Service: Cardiovascular;  Laterality: N/A;   MALONEY DILATION N/A 09/27/2016   Procedure: Elease Hashimoto DILATION;  Surgeon: Corbin Ade, MD;  [facility];  Service: Endoscopy;  Laterality: N/A;   POLYPECTOMY  09/27/2016   Procedure: POLYPECTOMY;  Surgeon: Corbin Ade, MD;  [facility];  Service: Endoscopy;;  colon   POLYPECTOMY  10/06/2022   Procedure: POLYPECTOMY;  Surgeon: Dolores Frame, MD;  [facility];  Service: Gastroenterology;;   ROTATOR CUFF REPAIR Bilateral    SUBMUCOSAL LIFTING INJECTION  10/06/2022   Procedure: SUBMUCOSAL LIFTING INJECTION;  Surgeon: Dolores Frame, MD;  [facility];  Service: Gastroenterology;;   TEE WITHOUT CARDIOVERSION N/A 07/07/2014   Procedure: TRANSESOPHAGEAL ECHOCARDIOGRAM (TEE);  Surgeon: Antoine Poche, MD;  [facility];  Service: Cardiology;  Laterality: N/A;   TEE WITHOUT CARDIOVERSION N/A 07/20/2014   Procedure: TRANSESOPHAGEAL ECHOCARDIOGRAM  (TEE) WITH PROPOFOL;  Surgeon: Antoine Poche, MD;  Location: [facility];  Service: Endoscopy;  Laterality: N/A;   TEE WITHOUT CARDIOVERSION N/A 10/07/2014   Procedure: TRANSESOPHAGEAL ECHOCARDIOGRAM (TEE);  Surgeon: Purcell Nails, MD;  Location: [facility] OR;  Service: Open Heart Surgery;  Laterality: N/A;     Allergies  Allergen Reactions   Iodinated Contrast Media Anaphylaxis, Shortness Of Breath, Itching, Swelling and Other (See Comments)    Patient was given Omni 350 and suffered from itching, chest pain and shortness of breath. Patient was taken to ED after onset of reaction. 10/11/2021  MD zackowski noted pt had tongue and throat swelling, had to give pt epinephrine    Sulfa Antibiotics Nausea And Vomiting      Family History  Problem Relation Age of Onset   Hypertension Mother    Hypertension Father    Heart disease Father        before age 38   Other Father        varicose veins   COPD Father    Colon cancer Neg Hx    Celiac disease Neg Hx    Inflammatory bowel disease Neg Hx      Social History Ms. Kammer reports that she quit smoking about 19 months ago. Her smoking use included cigarettes. She started smoking about 41 years ago. She has a 20 pack-year smoking history. She has never used smokeless tobacco. Ms. Crowl reports current alcohol use of about 2.0 standard drinks of alcohol per week.   Review of Systems CONSTITUTIONAL: No weight loss, fever, chills, weakness or fatigue.  HEENT: Eyes: No visual loss, blurred vision, double vision or yellow sclerae.No hearing loss, sneezing, congestion, runny nose or sore throat.  SKIN: No rash  or itching.  CARDIOVASCULAR: per hpi RESPIRATORY: No shortness of breath, cough or sputum.  GASTROINTESTINAL: No anorexia, nausea, vomiting or diarrhea. No abdominal pain or blood.  GENITOURINARY: No burning on urination, no polyuria NEUROLOGICAL: No headache, dizziness, syncope, paralysis, ataxia, numbness or tingling in the extremities. No  change in bowel or bladder control.  MUSCULOSKELETAL: No muscle, back pain, joint pain or stiffness.  LYMPHATICS: No enlarged nodes. No history of splenectomy.  PSYCHIATRIC: No history of depression or anxiety.  ENDOCRINOLOGIC: No reports of sweating, cold or heat intolerance. No polyuria or polydipsia.  [name]   Physical Examination Today's Vitals   01/04/23 1552 01/04/23 1646  BP: (!) 142/78 (!) 152/82  Pulse: 82   SpO2: 98%   Weight: 228 lb (103.4 kg)   Height: 5\' 5"  (1.651 m)    Body mass index is 37.94 kg/m.  Gen: resting comfortably, no acute distress HEENT: no scleral icterus, pupils equal round and reactive, no palptable cervical adenopathy,  CV: RRR, no m/rg, no jvd Resp: Clear to auscultation bilaterally GI: abdomen is soft, non-tender, non-distended, normal bowel sounds, no hepatosplenomegaly MSK: extremities are warm, no edema.  Skin: warm, no rash Neuro:  no focal deficits Psych: appropriate affect   Diagnostic Studies  11/2015 echo Study Conclusions   - Left ventricle: The cavity size was normal. Wall thickness was   normal. Systolic function was vigorous. The estimated ejection   fraction was in the range of 65% to 70%. Wall motion was normal;   there were no regional wall motion abnormalities. Doppler   parameters are consistent with abnormal left ventricular   relaxation (grade 1 diastolic dysfunction). - Aortic valve: 21 mm Medtronic Freestyle stentless porcine   valve/aortic root graft in place. There was no significant   regurgitation. Peak gradient (S): 19 mm Hg. Valve area (Vmax):   1.83 cm^2. - Mitral valve: Calcified annulus. There was trivial regurgitation. - Right ventricle: The cavity size was mildly dilated. - Right atrium: Central venous pressure (est): 3 mm Hg. - Atrial septum: The septum bowed from right to left, consistent   with increased right atrial pressure. - Tricuspid valve: There was mild regurgitation. - Pulmonic valve: Peak  gradient (S): 19 mm Hg. - Pulmonary arteries: PA peak pressure: 21 mm Hg (S). - Pericardium, extracardiac: There was no pericardial effusion.   Impressions:   - Normal LV wall thickness with LVEF 65-70%. Grade 1 diastolic   dysfunction. MAC with trivial mitral regurgitation. Bioprosthesis   in aortic position as described above with no significant   perivalvular regurgitation and peak gradient 19 mmHg. Mild RV   enlargement. Mild tricuspid regurgitation with PASP estimated 21   mmHg.    07/2014 cath Hemodynamic Findings: Ao:   107/55             LV: 190/12/15 RA:  1         RV:  41/2/6 PA:   26/6 (mean 15)     PCWP:  7 Fick Cardiac Output: 4.76 L/min Fick Cardiac Index: 2.38 L/min/m2 Central Aortic Saturation: 96% Pulmonary Artery Saturation: 66%   Aortic valve data:  Peak to peak gradient 83 mm Hg Mean gradient 56 mmHg AVA 0.52 cm2   Angiographic Findings:   Left main: Normal caliber vessel with no obstructive disease.    Left Anterior Descending Artery: Large caliber vessel that courses to the apex. Moderate caliber diagonal Alaze Garverick. No obstructive disease.    Circumflex Artery: Large caliber vessel with moderate caliber obtuse marginal Reynaldo Rossman.  No obstructive disease.    Right Coronary Artery: Large dominant vessel with no obstructive disease.    Left Ventricular Angiogram: LVEF=60%   Aortic root angiogram: The aortic root is not enlarged. There is supravalvular density.    Aortic valve calcification noted on plain fluoroscopy.    Impression: 1. No angiographic evidence of CAD 2. Normal LV systolic function 3. Severe gradient across the aortic valve into the aorta with density noted in the aortic root. Cannot exclude recurrent supravalvular stenosis along with aortic valve stenosis. 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  4. Normal filling pressures     09/2017 echo Study Conclusions   - Left ventricle: The cavity size  was normal. Wall thickness was   normal. Systolic function was vigorous. The estimated ejection   fraction was in the range of 65% to 70%. Wall motion was normal;   there were no regional wall motion abnormalities. Doppler   parameters are consistent with abnormal left ventricular   relaxation (grade 1 diastolic dysfunction). Doppler parameters   are consistent with indeterminate ventricular filling pressure. - Aortic valve: 21 mm Medtronic Freestyle stentless porcine   valve/aortic root graft in place. There was no significant   regurgitation. There was no stenosis. Peak gradient (S): 11 mm   Hg. - Mitral valve: Mildly calcified annulus. Normal thickness leaflets   . - Tricuspid valve: There was mild regurgitation. - Pulmonary arteries: PA peak pressure: 35 mm Hg (S).   12/2021 carotid US IMPRESSION: Color duplex indicates minimal heterogeneous plaque, with no hemodynamically significant stenosis by duplex criteria in the extracranial cerebrovascular circulation.         12/2021 echo 1. Left ventricular ejection fraction, by estimation, is 65 to 70%. The  left ventricle has normal function. The left ventricle has no regional  wall motion abnormalities. Left ventricular diastolic parameters are  indeterminate.   2. Right ventricular systolic function is normal. The right ventricular  size is normal. There is mildly elevated pulmonary artery systolic  pressure.   3. Right atrial size was mildly dilated.   4. The mitral valve is normal in structure. Mild mitral valve  regurgitation.   5. S/p AVR. (21 mm Medtronic Freestyle stentless porcine valve. Peak and  mean gradients through the valve are 13 and 5 mm Hg respectively.. The  aortic valve has been repaired/replaced. Aortic valve regurgitation is not  visualized.   6. The inferior vena cava is normal in size with greater than 50%  respiratory variability, suggesting right atrial pressure of 3 mmHg.    Assessment and Plan  1.  Aortic stenosis s/p AVR - s/p tissue AVR and aortic root graft placement - 12/2021 echo with normal functioning AVR - no symptoms, continue current meds     2. HTN -elevated here but at goal recent other visits, she will call and update [place] with her bp next week at her pcp visit. Room to titrate losartan if needed   3. Hyperlipidemia - she is at goal, continue current meds   4. SMA stenosis - multiple interventions by IR, she reports to me she was told to be on lifelong DAPT per IR.   F/u 6 months     Antoine Poche, M.D.

## 2023-01-06 ENCOUNTER — Other Ambulatory Visit: Payer: Self-pay | Admitting: Cardiology

## 2023-01-08 ENCOUNTER — Encounter: Payer: Medicare Other | Admitting: Family Medicine

## 2023-01-17 ENCOUNTER — Ambulatory Visit: Payer: Medicare Other | Admitting: Family Medicine

## 2023-01-22 ENCOUNTER — Ambulatory Visit: Payer: Medicare Other | Admitting: Family Medicine

## 2023-02-07 ENCOUNTER — Encounter (INDEPENDENT_AMBULATORY_CARE_PROVIDER_SITE_OTHER): Payer: Self-pay | Admitting: Gastroenterology

## 2023-02-09 ENCOUNTER — Other Ambulatory Visit: Payer: Self-pay | Admitting: Student

## 2023-02-09 DIAGNOSIS — K551 Chronic vascular disorders of intestine: Secondary | ICD-10-CM

## 2023-02-09 DIAGNOSIS — I739 Peripheral vascular disease, unspecified: Secondary | ICD-10-CM

## 2023-02-09 MED ORDER — CLOPIDOGREL BISULFATE 75 MG PO TABS
75.0000 mg | ORAL_TABLET | Freq: Every day | ORAL | 3 refills | Status: AC
Start: 2023-02-09 — End: ?

## 2023-02-21 ENCOUNTER — Other Ambulatory Visit: Payer: Self-pay | Admitting: Gastroenterology

## 2023-02-21 DIAGNOSIS — K219 Gastro-esophageal reflux disease without esophagitis: Secondary | ICD-10-CM

## 2023-02-25 ENCOUNTER — Other Ambulatory Visit: Payer: Self-pay | Admitting: Family Medicine

## 2023-02-25 DIAGNOSIS — I5022 Chronic systolic (congestive) heart failure: Secondary | ICD-10-CM

## 2023-02-25 DIAGNOSIS — I10 Essential (primary) hypertension: Secondary | ICD-10-CM

## 2023-03-05 ENCOUNTER — Ambulatory Visit (INDEPENDENT_AMBULATORY_CARE_PROVIDER_SITE_OTHER): Payer: Medicare Other | Admitting: Gastroenterology

## 2023-03-05 ENCOUNTER — Telehealth (INDEPENDENT_AMBULATORY_CARE_PROVIDER_SITE_OTHER): Payer: Self-pay

## 2023-03-05 NOTE — Telephone Encounter (Signed)
[name] [address] [place], [place] [address] Hours of Operations: Address: 5 a.m. - 10 p.m. PT, Monday-Friday [address] 6 a.m. - 3 p.m. PT, Saturday [place], [place] [address] Date: 03/02/2023 To: [name] From: [facility] Phone: [phone] Phone: [phone] Fax: [phone] Reference #: [id number] RE: Prior Authorization Request Patient Name: Michelle Patton Patient DOB: 07-23-1955 Patient ID: [id number] Status of Request: Approve Medication Name: Rabeprazole Tab 20mg  GPI/NDC: [id number] Decision Notes: RABEPRAZOLE TAB 20MG , use as directed, is approved for non-formulary exception through 12/31/ 2025 under your Medicare Part D benefit. Reviewed by: System. If the treating physician would like to discuss this coverage decision with the physician or health care professional reviewer, please call [facility] Prior Authorization department at [phone].

## 2023-03-09 DIAGNOSIS — K219 Gastro-esophageal reflux disease without esophagitis: Secondary | ICD-10-CM

## 2023-03-12 MED ORDER — RABEPRAZOLE SODIUM 20 MG PO TBEC
20.0000 mg | DELAYED_RELEASE_TABLET | Freq: Two times a day (BID) | ORAL | 3 refills | Status: DC
Start: 2023-03-12 — End: 2023-11-16

## 2023-03-19 ENCOUNTER — Ambulatory Visit (INDEPENDENT_AMBULATORY_CARE_PROVIDER_SITE_OTHER): Payer: Medicare Other | Admitting: Gastroenterology

## 2023-03-19 ENCOUNTER — Encounter (INDEPENDENT_AMBULATORY_CARE_PROVIDER_SITE_OTHER): Payer: Self-pay | Admitting: Gastroenterology

## 2023-03-19 VITALS — BP 123/81 | HR 71 | Temp 96.7°F | Ht 66.0 in | Wt 229.7 lb

## 2023-03-19 DIAGNOSIS — K551 Chronic vascular disorders of intestine: Secondary | ICD-10-CM

## 2023-03-19 DIAGNOSIS — F17210 Nicotine dependence, cigarettes, uncomplicated: Secondary | ICD-10-CM

## 2023-03-19 DIAGNOSIS — F17200 Nicotine dependence, unspecified, uncomplicated: Secondary | ICD-10-CM

## 2023-03-19 NOTE — Progress Notes (Unsigned)
Michelle Patton, M.D. Gastroenterology & Hepatology [facility] G[facility]enter[facility] [address] [place], [place] [address]  Primary Care Physician: Shade Flood, MD [address] [place] [address]  I will communicate my assessment and recommendations to the referring MD via EMR.  Problems: Chronic mesenteric ischemia status post stent placement on 10/29/2020 complicated by recurrent thrombosis status post thrombus aspiration, SMA stent recanalization and balloon angioplasty,   History of Present Illness: Michelle Patton is a 67 y.o. female with past medical history of CAD, history of chronic abdominal pain secondary to chronic mesenteric ischemia status post stent placement on 10/29/2020 complicated by recurrent thrombosis status post SMA stent recanalization and balloon angioplasty, severe aortic stenosis, DVT, GERD, hyperlipidemia, hypertension, peripheral arterial disease, sleep apnea, who presents for follow up of chronic mesenteric ischemia.  The patient was last seen on 08/28/2022. At that time, the patient was started on Chantix for smoking cessation.  States that very occasionally she has pain in her left side for the abdomen, but states it was not even near what she had in the past. Not very concerned about this.  The patient denies having any nausea, vomiting, fever, chills, hematochezia, melena, hematemesis, abdominal distention, abdominal pain, diarrhea, jaundice, pruritus or weight loss.  Last time seen by Dr. Elby Showers on 10/18/2022, showed increased velocities but patency of the SMA stents.  Plan was for repeat duplex in 6 months.  She is still smoking half a pack a day. States Chantix helped but she ran out of the medication. She reports that she is surrounded by smokers so it is hard to stop smoking.  Last EGD:09/2020 Nonobstructing Schatzki's ring, gastritis, some falls had pallor.  Normal duodenum.  Biopsies negative for H. pylori.  Last  Colonoscopy: 10/06/2022 - One 12 mm polyp in the cecum, removed with a cold snare. Resected and retrieved via EMR. Clips were placed. Clip manufacturer: [employer]. - One 3 mm polyp in the ascending colon, removed with a cold snare. Resected and retrieved. - One 3 mm polyp in the transverse colon, removed with a cold snare. Resected and retrieved via EMR. - Diverticulosis in the sigmoid colon and in the descending colon. - Non- bleeding internal hemorrhoids.  Pathology showed 3 SSLs, repeat colonoscopy in 1 year due to piecemeal polypectomy.   Past Medical History: Past Medical History:  Diagnosis Date   Aortic regurgitation 07/20/2014   Aortic stenosis, severe 07/20/2014   Arthritis    DVT (deep venous thrombosis) (HCC)    Family history of adverse reaction to anesthesia    Reports father deliurm in his 47's with CABG   GERD (gastroesophageal reflux disease)    History of pneumonia    Hx of repair of rotator cuff    R and L rotator cuff repair   Hyperlipidemia    Hypertension    Insomnia, unspecified    patient stated d/t menopause   Mesenteric ischemia (HCC)    Muscle weakness (generalized)    Peripheral artery disease (HCC)    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   Redo sternotomy for 21 mm Medtronic Freestyle porcine aortic root graft w/ reimplantation of left main and right coronary arteries   Sleep apnea    diagnosed multiple years ago at [facility] aortic stenosis, congenital - s/p repair during childhood     Past Surgical History: Past Surgical History:  Procedure Laterality Date   ABDOMINAL AORTAGRAM  06/24/2012  ABDOMINAL AORTAGRAM N/A 06/24/2012   Procedure: ABDOMINAL Ronny Flurry;  Surgeon: Chuck Hint, MD;  Location: [facility] CATH LAB;  Service: Cardiovascular;  Laterality: N/A;   AORTIC VALVE REPLACEMENT N/A 10/07/2014   Procedure: REDO AORTIC VALVE REPLACEMENT (AVR);  Surgeon: Purcell Nails,  MD;  Location: [facility] OR;  Service: Open Heart Surgery;  Laterality: N/A;   ASCENDING AORTIC ROOT REPLACEMENT N/A 10/07/2014   Procedure: ASCENDING AORTIC ROOT REPLACEMENT;  Surgeon: Purcell Nails, MD;  Location: [facility] OR;  Service: Open Heart Surgery;  Laterality: N/A;   BIOPSY  09/27/2016   Procedure: BIOPSY;  Surgeon: Corbin Ade, MD;  Location: [facility] ENDO SUITE;  Service: Endoscopy;;  colon   BIOPSY  10/18/2020   Procedure: BIOPSY;  Surgeon: Lanelle Bal, DO;  Location: [facility];  Service: Endoscopy;;   BREAST REDUCTION SURGERY Bilateral 01/21/2018   Procedure: BREAST REDUCTION WITH LIPOSUCTION;  Surgeon: Louisa Second, MD;  Location: [facility];  Service: Plastics;  Laterality: Bilateral;   CARDIAC VALVE SURGERY  1968   CARPAL TUNNEL RELEASE Right 03/02/2020   Procedure: CARPAL TUNNEL RELEASE;  Surgeon: Vickki Hearing, MD;  Location: [facility] ORS;  Service: Orthopedics;  Laterality: Right;   CATARACT EXTRACTION W/PHACO Left 05/30/2019   Procedure: CATARACT EXTRACTION PHACO AND INTRAOCULAR LENS PLACEMENT (IOC) (CDE: 4.94  );  Surgeon: Fabio Pierce, MD;  Location: [facility] ORS;  Service: Ophthalmology;  Laterality: Left;   CATARACT EXTRACTION W/PHACO Right 06/16/2019   Procedure: CATARACT EXTRACTION PHACO AND INTRAOCULAR LENS PLACEMENT (IOC);  Surgeon: Fabio Pierce, MD;  Location: [facility] ORS;  Service: Ophthalmology;  Laterality: Right;  CDE: 4.64   CERVICAL FUSION     CHOLECYSTECTOMY     COLONOSCOPY WITH PROPOFOL N/A 09/27/2016   Dr. Jena Gauss: Diverticulosis, several tubular adenomas removed ranging 4 to 7 mm in size, internal grade 1 hemorrhoids, terminal ileum normal, segmental biopsies negative for microscopic colitis.  Next colonoscopy June 2021   COLONOSCOPY WITH PROPOFOL N/A 05/06/2020   internal hemorrhoids, sigmoid diverticulosis. Surveillance colonoscopy due in 2027   COLONOSCOPY WITH PROPOFOL N/A 10/06/2022   Procedure: COLONOSCOPY WITH PROPOFOL;  Surgeon: Dolores Frame, MD;  Location: [facility];  Service: Gastroenterology;  Laterality: N/A;  10:45AM;ASA 1   ESOPHAGOGASTRODUODENOSCOPY (EGD) WITH PROPOFOL N/A 09/27/2016   Dr. Jena Gauss: Small hiatal hernia, mild Schatzki ring status post disruption, LA grade a esophagitis   ESOPHAGOGASTRODUODENOSCOPY (EGD) WITH PROPOFOL N/A 10/18/2020   non-obstructing mild Schatzki ring, gastritis s/ biopsy. Negative H.pylori.   HEMOSTASIS CLIP PLACEMENT  10/06/2022   Procedure: HEMOSTASIS CLIP PLACEMENT;  Surgeon: Dolores Frame, MD;  [facility]: [facility];  Service: Gastroenterology;;   ILIAC ARTERY STENT Left 12/2007   IR ANGIOGRAM EXTREMITY BILATERAL  07/12/2021   IR ANGIOGRAM VISCERAL SELECTIVE  10/29/2020   IR ANGIOGRAM VISCERAL SELECTIVE  07/12/2021   IR ANGIOGRAM VISCERAL SELECTIVE  03/21/2022   IR AORTAGRAM ABDOMINAL SERIALOGRAM  03/21/2022   IR IVUS EACH ADDITIONAL NON CORONARY VESSEL  07/12/2021   IR IVUS EACH ADDITIONAL NON CORONARY VESSEL  03/21/2022   IR PTA NON CORO-LOWER EXTREM  03/21/2022   IR RADIOLOGIST EVAL & MGMT  10/26/2020   IR RADIOLOGIST EVAL & MGMT  11/10/2020   IR RADIOLOGIST EVAL & MGMT  02/09/2021   IR RADIOLOGIST EVAL & MGMT  06/16/2021   IR RADIOLOGIST EVAL & MGMT  08/15/2021   IR RADIOLOGIST EVAL & MGMT  10/18/2021   IR RADIOLOGIST EVAL & MGMT  01/11/2022   IR  RADIOLOGIST EVAL & MGMT  03/02/2022   IR RADIOLOGIST EVAL & MGMT  05/11/2022   IR RADIOLOGIST EVAL & MGMT  08/18/2022   IR RADIOLOGIST EVAL & MGMT  10/18/2022   IR THROMBECT PRIM MECH INIT (INCLU) MOD SED  03/21/2022   IR THROMBECT SEC MECH MOD SED  07/12/2021   IR TRANSCATH PLC STENT 1ST ART NOT LE CV CAR VERT CAR  10/29/2020   IR TRANSCATH PLC STENT 1ST ART NOT LE CV CAR VERT CAR  07/12/2021   IR US GUIDE VASC ACCESS RIGHT  10/29/2020   IR US GUIDE VASC ACCESS RIGHT  07/12/2021   IR US GUIDE VASC ACCESS RIGHT  03/21/2022   KNEE ARTHROSCOPY WITH MEDIAL MENISECTOMY Right 01/10/2018   Procedure: RIGHT KNEE  ARTHROSCOPY WITH PARTIAL MEDIAL MENISECTOMY;  Surgeon: Vickki Hearing, MD;  Location: [facility];  Service: Orthopedics;  Laterality: Right;   LAPAROSCOPIC APPENDECTOMY N/A 08/03/2022   Procedure: APPENDECTOMY LAPAROSCOPIC;  Surgeon: Axel Filler, MD;  Location[facility] OR;  Service: General;  Laterality: N/A;   LEFT AND RIGHT HEART CATHETERIZATION WITH CORONARY ANGIOGRAM N/A 07/31/2014   Procedure: LEFT AND RIGHT HEART CATHETERIZATION WITH CORONARY ANGIOGRAM;  Surgeon: Kathleene Hazel, MD;  Location: [facility] CATH LAB;  Service: Cardiovascular;  Laterality: N/A;   MALONEY DILATION N/A 09/27/2016   Procedure: Elease Hashimoto DILATION;  Surgeon: Corbin Ade, MD;  [facility];  Service: Endoscopy;  Laterality: N/A;   POLYPECTOMY  09/27/2016   Procedure: POLYPECTOMY;  Surgeon: Corbin Ade, MD;  [facility]: [facility];  Service: Endoscopy;;  colon   POLYPECTOMY  10/06/2022   Procedure: POLYPECTOMY;  Surgeon: Dolores Frame, MD;  [facility]: [facility];  Service: Gastroenterology;;   ROTATOR CUFF REPAIR Bilateral    SUBMUCOSAL LIFTING INJECTION  10/06/2022   Procedure: SUBMUCOSAL LIFTING INJECTION;  Surgeon: Dolores Frame, MD;  [facility]: [facility];  Service: Gastroenterology;;   TEE WITHOUT CARDIOVERSION N/A 07/07/2014   Procedure: TRANSESOPHAGEAL ECHOCARDIOGRAM (TEE);  Surgeon: Antoine Poche, MD;  [facility]: [facility];  Service: Cardiology;  Laterality: N/A;   TEE WITHOUT CARDIOVERSION N/A 07/20/2014   Procedure: TRANSESOPHAGEAL ECHOCARDIOGRAM (TEE) WITH PROPOFOL;  Surgeon: Antoine Poche, MD;  Location: [facility];  Service: Endoscopy;  Laterality: N/A;   TEE WITHOUT CARDIOVERSION N/A 10/07/2014   Procedure: TRANSESOPHAGEAL ECHOCARDIOGRAM (TEE);  Surgeon: Purcell Nails, MD;  Location: [facility] OR;  Service: Open Heart Surgery;  Laterality: N/A;    Family History: Family History  Problem Relation Age of Onset   Hypertension Mother    Hypertension Father     Heart disease Father        before age 60   Other Father        varicose veins   COPD Father    Colon cancer Neg Hx    Celiac disease Neg Hx    Inflammatory bowel disease Neg Hx     Social History: Social History   Tobacco Use  Smoking Status Every Day   Current packs/day: 0.00   Average packs/day: 0.5 packs/day for 40.0 years (20.0 ttl pk-yrs)   Types: Cigarettes   Start date: 05/1981   Last attempt to quit: 05/2021   Years since quitting: 1.8  Smokeless Tobacco Never   Social History   Substance and Sexual Activity  Alcohol Use Yes   Alcohol/week: 2.0 standard drinks of alcohol   Types: 2 Cans of beer per week   Social History   Substance and Sexual Activity  Drug Use No    Allergies: Allergies  Allergen Reactions   Iodinated Contrast Media Anaphylaxis, Shortness Of Breath, Itching, Swelling and Other (See Comments)    Patient was given Omni 350 and suffered from itching, chest pain and shortness of breath. Patient was taken to ED after onset of reaction. 10/11/2021  MD zackowski noted pt had tongue and throat swelling, had to give pt epinephrine    Sulfa Antibiotics Nausea And Vomiting    Medications: Current Outpatient Medications  Medication Sig Dispense Refill   acetaminophen (TYLENOL) 325 MG tablet Take 650 mg by mouth every 6 (six) hours as needed for mild pain, moderate pain, fever or headache.     aspirin EC 81 MG tablet Take 1 tablet (81 mg total) by mouth daily. Swallow whole. 90 tablet 3   Cholecalciferol (VITAMIN D-3 PO) Take 1 capsule by mouth in the morning and at bedtime.     clopidogrel (PLAVIX) 75 MG tablet Take 1 tablet (75 mg total) by mouth daily. 90 tablet 3   estradiol (ESTRACE) 2 MG tablet Take 1 tablet (2 mg total) by mouth daily. 30 tablet 3   ezetimibe (ZETIA) 10 MG tablet TAKE 1 TABLET BY MOUTH DAILY 90 tablet 3   Flaxseed, Linseed, (FLAX SEED OIL PO) Take 1 capsule by mouth in the morning.     fluticasone (FLONASE) 50 MCG/ACT  nasal spray Place 1 spray into both nostrils 2 (two) times daily. 16 g 2   hydrocortisone (ANUSOL-HC) 2.5 % rectal cream Place 1 Application rectally 2 (two) times daily. As needed for rectal bleeding. (Patient taking differently: Place 1 Application rectally 2 (two) times daily as needed for hemorrhoids (rectal bleeding).) 30 g 1   losartan (COZAAR) 25 MG tablet TAKE 1 TABLET BY MOUTH DAILY 90 tablet 3   metoprolol tartrate (LOPRESSOR) 25 MG tablet TAKE 1 TABLET BY MOUTH TWICE  DAILY 180 tablet 3   Multiple Minerals (CALCIUM-MAGNESIUM-ZINC) TABS Take 1 tablet by mouth in the morning.     NP THYROID 120 MG tablet Take 60 mg by mouth daily before breakfast.     progesterone (PROMETRIUM) 200 MG capsule Take 2 capsules (400 mg total) by mouth daily. (Patient taking differently: Take 400 mg by mouth at bedtime.) 180 capsule 1   RABEprazole (ACIPHEX) 20 MG tablet Take 1 tablet (20 mg total) by mouth 2 (two) times daily before a meal. 180 tablet 3   rosuvastatin (CRESTOR) 40 MG tablet TAKE 1 TABLET BY MOUTH DAILY 90 tablet 3   triamcinolone ointment (KENALOG) 0.1 % Apply 1 Application topically 2 (two) times daily as needed. (Patient taking differently: Apply 1 Application topically 2 (two) times daily as needed (irritation).) 454 g 0   No current facility-administered medications for this visit.    Review of Systems: GENERAL: negative for malaise, night sweats HEENT: No changes in hearing or vision, no nose bleeds or other nasal problems. NECK: Negative for lumps, goiter, pain and significant neck swelling RESPIRATORY: Negative for cough, wheezing CARDIOVASCULAR: Negative for chest pain, leg swelling, palpitations, orthopnea GI: SEE HPI MUSCULOSKELETAL: Negative for joint pain or swelling, back pain, and muscle pain. SKIN: Negative for lesions, rash PSYCH: Negative for sleep disturbance, mood disorder and recent psychosocial stressors. HEMATOLOGY Negative for prolonged bleeding, bruising easily,  and swollen nodes. ENDOCRINE: Negative for cold or heat intolerance, polyuria, polydipsia and goiter. NEURO: negative for tremor, gait imbalance, syncope and seizures. The remainder of the review of systems is noncontributory.   Physical Exam: BP 123/81 (  BP Location: Left Arm, Patient Position: Sitting, Cuff Size: Large)   Pulse 71   Temp (!) 96.7 F (35.9 C) (Temporal)   Ht 5\' 6"  (1.676 m)   Wt 229 lb 11.2 oz (104.2 kg)   BMI 37.07 kg/m  GENERAL: The patient is AO x3, in no acute distress. HEENT: Head is normocephalic and atraumatic. EOMI are intact. Mouth is well hydrated and without lesions. NECK: Supple. No masses LUNGS: Clear to auscultation. No presence of rhonchi/wheezing/rales. Adequate chest expansion HEART: RRR, normal s1 and s2. ABDOMEN: Soft, nontender, no guarding, no peritoneal signs, and nondistended. BS +. No masses. RECTAL EXAM: no external lesions, normal tone, no masses, brown stool without blood.*** Chaperone: EXTREMITIES: Without any cyanosis, clubbing, rash, lesions or edema. NEUROLOGIC: AOx3, no focal motor deficit. SKIN: no jaundice, no rashes  Imaging/Labs: as above  I personally reviewed and interpreted the available labs, imaging and endoscopic files.  Impression and Plan: Pacience Lavere is a 67 y.o. female coming for follow up of ***   All questions were answered.      Michelle Blazing, MD Gastroenterology and Hepatology [facility] Gastroenterology

## 2023-03-19 NOTE — Patient Instructions (Signed)
Continue working on smoking cessation Please call back in January to refill your Chantix Follow-up with Dr. Elby Showers with repeat Doppler in December 2024 Repeat colonoscopy in June 2025

## 2023-04-11 ENCOUNTER — Encounter: Payer: Self-pay | Admitting: Interventional Radiology

## 2023-04-19 ENCOUNTER — Encounter: Payer: Self-pay | Admitting: Family Medicine

## 2023-04-19 ENCOUNTER — Ambulatory Visit: Payer: Medicare Other | Admitting: Family Medicine

## 2023-04-19 VITALS — BP 144/80 | HR 78 | Temp 98.4°F | Ht 66.0 in | Wt 235.8 lb

## 2023-04-19 DIAGNOSIS — R079 Chest pain, unspecified: Secondary | ICD-10-CM | POA: Diagnosis not present

## 2023-04-19 DIAGNOSIS — I739 Peripheral vascular disease, unspecified: Secondary | ICD-10-CM

## 2023-04-19 DIAGNOSIS — K551 Chronic vascular disorders of intestine: Secondary | ICD-10-CM | POA: Diagnosis not present

## 2023-04-19 DIAGNOSIS — R5383 Other fatigue: Secondary | ICD-10-CM | POA: Diagnosis not present

## 2023-04-19 DIAGNOSIS — I5022 Chronic systolic (congestive) heart failure: Secondary | ICD-10-CM | POA: Diagnosis not present

## 2023-04-19 NOTE — Progress Notes (Signed)
Subjective:  Patient ID: Michelle Patton, female    DOB: 1955-09-20  Age: 67 y.o. MRN: [id number]  CC:  Chief Complaint  Patient presents with   Fatigue    Pt is here not feeling well, last several months     HPI Michelle Patton presents for   Fatigue: Reports fatigue past few months. Feels tired, no energy.  Feels similar to when she was staring to go into CHF in 2015. Not as bad. No dyspnea. Sporadic chest pain - few times per week. Left lower chest or right lower chest. Pressure on left earlier today. Sharp on lower Right. Lasts few minutes. Notes at rest or with activity. No known triggers.  No associated dyspnea, diaphoresis, nausea. No arm/shoulder radiation. No DOE. Raking leaves without dyspnea/CP, but tired.   No hearburn, abd pain, n/v or change in stools.  No fever/rash.  No alleviating factors. Goes away on it's own.  Some increased stress with work since January. 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 She will be retiring in March.  Nocturia once per night. Wakes up once per night. No orthopnea/PND. Hx of OSA, unable to use CPAP - sleeps on stomach and cost issue with machine. No current treatment for OSA. Feels ok when she wakes up. Fatigue sets in after an hour or two.  No BRBPR, melena, hematochezia.  No new supplements, meds or missed doses of meds.   Video visit for COVID-19 infection August 26.  Urgent care visit initially on August 15, treated with molnupiravir, Flonase, supportive care.  She was improving with minimal residual symptoms at the August 26 visit.  Still some fatigue at that time. Had returned to baseline, fatigue started later - not persistent after covid infection.   Prior history of ruptured appendicitis with delayed appendectomy.  Chronic mesenteric ischemia with stent placement in 2022 complicated by recurrent thrombosis status post SMA stent recanalization and angioplasty, followed  by gastroenterology, Dr. Levon Hedger with last appointment November 25. Treated with NP thyroid, estradiol and progesterone by previous primary provider and then followed up with  [facility] for continued hormonal treatment.  Last visit noted on December 6, on NP thyroid 120 mg 1/2 tablet daily. No recent dosage changes. Unknown if recent tsh.   She does have a history of moderate obstructive sleep apnea but unable to tolerate CPAP. Also with history of CHF, PAD, hypertension hyperlipidemia followed by cardiology with Lasix as needed for pedal edema - no recent use.  Dr. Elby Showers, vascular surgery has followed her for history of left external iliac stent for PAD, and SMA stenosis.  Lifelong Plavix due to multiple SMA stents.  Also with aortic stenosis, aortic valve replacement in 2016.  Cardiology, Dr. Wyline Mood.  Echo September 2023 with EF 65 to 70%.   Lab Results  Component Value Date   NA 140 07/18/2022   K 4.5 07/18/2022   CL 102 07/18/2022   CO2 23 07/18/2022   Lab Results  Component Value Date   CREATININE 1.04 (H) 07/18/2022   Lab Results  Component Value Date   WBC 9.8 07/26/2022   HGB 12.8 07/26/2022   HCT 41.2 07/26/2022   MCV 84.3 07/26/2022   PLT 295 07/26/2022   Lab Results  Component Value Date   TSH <0.01 (L) 11/09/2020        History Patient Active Problem List   Diagnosis Date Noted  Tobacco use disorder 08/28/2022   Leukocytosis 03/15/2022   Mixed hyperlipidemia 03/15/2022   Acquired hypothyroidism 03/15/2022   Hypoalbuminemia due to protein-calorie malnutrition (HCC) 03/15/2022   Body mass index (BMI) 34.0-34.9, adult 03/15/2022   Acute perforated appendicitis 03/14/2022   Allergy, drug 01/13/2022   Prolapsed internal hemorrhoids, grade 3 12/06/2021   Left lower quadrant abdominal pain 12/06/2021   Acute anaphylaxis 10/11/2021   Mesenteric ischemia (HCC) 07/12/2021   Chronic mesenteric ischemia (HCC) 02/22/2021   Elevated LFTs 10/05/2020   Class 2  severe obesity due to excess calories with serious comorbidity and body mass index (BMI) of 36.0 to 36.9 in adult [facility]) 08/09/2020   PAD (peripheral artery disease) (HCC) 08/09/2020   Aortic valve prosthesis present 08/09/2020   Chronic systolic congestive heart failure (HCC) 08/09/2020   OSA and COPD overlap syndrome (HCC) 08/09/2020   s/p right carpal tunnel release 03/02/20 04/06/2020   Closed displaced fracture of proximal phalanx of lesser toe of right foot 12/31/2019   Carpal tunnel syndrome of right wrist 12/05/2019   Nondisplaced fracture of fifth right metatarsal bone with routine healing 10/21/2019   Skin lesion 04/11/2019   Low back pain without sciatica 01/20/2019   Pain of upper abdomen 01/14/2019   Fatigue 12/09/2018   Breast cancer screening 12/09/2018   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 left wrist 10/23/2018   S/P right knee arthroscopy 01/10/18 01/18/2018   Chondromalacia, patella, right    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    History of colonic polyps 12/28/2016   Abdominal pain, epigastric 08/10/2016   Left sided abdominal pain 08/10/2016   Esophageal dysphagia 08/10/2016   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   Supravalvular aortic stenosis, congenital - s/p repair during childhood    Essential hypertension    GERD (gastroesophageal reflux disease)    Aortic stenosis, severe 07/20/2014   Aortic regurgitation 07/20/2014   Leg cramps 09/26/2012   Occlusion and stenosis of carotid artery without mention of cerebral infarction 07/10/2012   Peripheral vascular disease, unspecified (HCC) 06/12/2012   Carotid artery bruit 06/12/2012   Past Medical History:  Diagnosis Date   Aortic regurgitation 07/20/2014   Aortic stenosis, severe 07/20/2014   Arthritis    DVT (deep venous thrombosis) (HCC)    Family history of adverse reaction to anesthesia    Reports father deliurm in his  29's with CABG   GERD (gastroesophageal reflux disease)    History of pneumonia    Hx of repair of rotator cuff    R and L rotator cuff repair   Hyperlipidemia    Hypertension    Insomnia, unspecified    patient stated d/t menopause   Mesenteric ischemia (HCC)    Muscle weakness (generalized)    Peripheral artery disease (HCC)    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   Redo sternotomy for 21 mm Medtronic Freestyle porcine aortic root graft w/ reimplantation of left main and right coronary arteries   Sleep apnea    diagnosed multiple years ago at [facility] aortic stenosis, congenital - s/p repair during childhood    Past Surgical History:  Procedure Laterality Date   ABDOMINAL AORTAGRAM  06/24/2012   ABDOMINAL AORTAGRAM N/A 06/24/2012   Procedure: ABDOMINAL Ronny Flurry;  Surgeon: Chuck Hint, MD;  Location: [facility] CATH LAB;  Service: Cardiovascular;  Laterality: N/A;   AORTIC VALVE REPLACEMENT N/A 10/07/2014   Procedure:  REDO AORTIC VALVE REPLACEMENT (AVR);  Surgeon: Purcell Nails, MD;  Location: [facility] OR;  Service: Open Heart Surgery;  Laterality: N/A;   ASCENDING AORTIC ROOT REPLACEMENT N/A 10/07/2014   Procedure: ASCENDING AORTIC ROOT REPLACEMENT;  Surgeon: Purcell Nails, MD;  Location: [facility] OR;  Service: Open Heart Surgery;  Laterality: N/A;   BIOPSY  09/27/2016   Procedure: BIOPSY;  Surgeon: Corbin Ade, MD;  Location: [facility] ENDO SUITE;  Service: Endoscopy;;  colon   BIOPSY  10/18/2020   Procedure: BIOPSY;  Surgeon: Lanelle Bal, DO;  Location: [facility];  Service: Endoscopy;;   BREAST REDUCTION SURGERY Bilateral 01/21/2018   Procedure: BREAST REDUCTION WITH LIPOSUCTION;  Surgeon: Louisa Second, MD;  Location: [facility];  Service: Plastics;  Laterality: Bilateral;   CARDIAC VALVE SURGERY  1968   CARPAL TUNNEL RELEASE Right 03/02/2020   Procedure: CARPAL TUNNEL RELEASE;  Surgeon:  Vickki Hearing, MD;  Location: [facility] ORS;  Service: Orthopedics;  Laterality: Right;   CATARACT EXTRACTION W/PHACO Left 05/30/2019   Procedure: CATARACT EXTRACTION PHACO AND INTRAOCULAR LENS PLACEMENT (IOC) (CDE: 4.94  );  Surgeon: Fabio Pierce, MD;  Location: [facility] ORS;  Service: Ophthalmology;  Laterality: Left;   CATARACT EXTRACTION W/PHACO Right 06/16/2019   Procedure: CATARACT EXTRACTION PHACO AND INTRAOCULAR LENS PLACEMENT (IOC);  Surgeon: Fabio Pierce, MD;  Location: [facility] ORS;  Service: Ophthalmology;  Laterality: Right;  CDE: 4.64   CERVICAL FUSION     CHOLECYSTECTOMY     COLONOSCOPY WITH PROPOFOL N/A 09/27/2016   Dr. Jena Gauss: Diverticulosis, several tubular adenomas removed ranging 4 to 7 mm in size, internal grade 1 hemorrhoids, terminal ileum normal, segmental biopsies negative for microscopic colitis.  Next colonoscopy June 2021   COLONOSCOPY WITH PROPOFOL N/A 05/06/2020   internal hemorrhoids, sigmoid diverticulosis. Surveillance colonoscopy due in 2027   COLONOSCOPY WITH PROPOFOL N/A 10/06/2022   Procedure: COLONOSCOPY WITH PROPOFOL;  Surgeon: Dolores Frame, MD;  [facility]: [facility];  Service: Gastroenterology;  Laterality: N/A;  10:45AM;ASA 1   ESOPHAGOGASTRODUODENOSCOPY (EGD) WITH PROPOFOL N/A 09/27/2016   Dr. Jena Gauss: Small hiatal hernia, mild Schatzki ring status post disruption, LA grade a esophagitis   ESOPHAGOGASTRODUODENOSCOPY (EGD) WITH PROPOFOL N/A 10/18/2020   non-obstructing mild Schatzki ring, gastritis s/ biopsy. Negative H.pylori.   HEMOSTASIS CLIP PLACEMENT  10/06/2022   Procedure: HEMOSTASIS CLIP PLACEMENT;  Surgeon: Dolores Frame, MD;  [facility];  Service: Gastroenterology;;   ILIAC ARTERY STENT Left 12/2007   IR ANGIOGRAM EXTREMITY BILATERAL  07/12/2021   IR ANGIOGRAM VISCERAL SELECTIVE  10/29/2020   IR ANGIOGRAM VISCERAL SELECTIVE  07/12/2021   IR ANGIOGRAM VISCERAL SELECTIVE  03/21/2022   IR AORTAGRAM ABDOMINAL SERIALOGRAM   03/21/2022   IR IVUS EACH ADDITIONAL NON CORONARY VESSEL  07/12/2021   IR IVUS EACH ADDITIONAL NON CORONARY VESSEL  03/21/2022   IR PTA NON CORO-LOWER EXTREM  03/21/2022   IR RADIOLOGIST EVAL & MGMT  10/26/2020   IR RADIOLOGIST EVAL & MGMT  11/10/2020   IR RADIOLOGIST EVAL & MGMT  02/09/2021   IR RADIOLOGIST EVAL & MGMT  06/16/2021   IR RADIOLOGIST EVAL & MGMT  08/15/2021   IR RADIOLOGIST EVAL & MGMT  10/18/2021   IR RADIOLOGIST EVAL & MGMT  01/11/2022   IR RADIOLOGIST EVAL & MGMT  03/02/2022   IR RADIOLOGIST EVAL & MGMT  05/11/2022   IR RADIOLOGIST EVAL & MGMT  08/18/2022   IR RADIOLOGIST EVAL & MGMT  10/18/2022   IR  THROMBECT PRIM MECH INIT (INCLU) MOD SED  03/21/2022   IR THROMBECT SEC MECH MOD SED  07/12/2021   IR TRANSCATH PLC STENT 1ST ART NOT LE CV CAR VERT CAR  10/29/2020   IR TRANSCATH PLC STENT 1ST ART NOT LE CV CAR VERT CAR  07/12/2021   IR US GUIDE VASC ACCESS RIGHT  10/29/2020   IR US GUIDE VASC ACCESS RIGHT  07/12/2021   IR US GUIDE VASC ACCESS RIGHT  03/21/2022   KNEE ARTHROSCOPY WITH MEDIAL MENISECTOMY Right 01/10/2018   Procedure: RIGHT KNEE ARTHROSCOPY WITH PARTIAL MEDIAL MENISECTOMY;  Surgeon: Vickki Hearing, MD;  Location: [facility];  Service: Orthopedics;  Laterality: Right;   LAPAROSCOPIC APPENDECTOMY N/A 08/03/2022   Procedure: APPENDECTOMY LAPAROSCOPIC;  Surgeon: Axel Filler, MD;  Location: [facility] OR;  Service: General;  Laterality: N/A;   LEFT AND RIGHT HEART CATHETERIZATION WITH CORONARY ANGIOGRAM N/A 07/31/2014   Procedure: LEFT AND RIGHT HEART CATHETERIZATION WITH CORONARY ANGIOGRAM;  Surgeon: Kathleene Hazel, MD;  Location: [facility] CATH LAB;  Service: Cardiovascular;  Laterality: N/A;   MALONEY DILATION N/A 09/27/2016   Procedure: Elease Hashimoto DILATION;  Surgeon: Corbin Ade, MD;  [facility];  Service: Endoscopy;  Laterality: N/A;   POLYPECTOMY  09/27/2016   Procedure: POLYPECTOMY;  Surgeon: Corbin Ade, MD;  [facility];  Service:  Endoscopy;;  colon   POLYPECTOMY  10/06/2022   Procedure: POLYPECTOMY;  Surgeon: Dolores Frame, MD;  [facility];  Service: Gastroenterology;;   ROTATOR CUFF REPAIR Bilateral    SUBMUCOSAL LIFTING INJECTION  10/06/2022   Procedure: SUBMUCOSAL LIFTING INJECTION;  Surgeon: Dolores Frame, MD;  [facility];  Service: Gastroenterology;;   TEE WITHOUT CARDIOVERSION N/A 07/07/2014   Procedure: TRANSESOPHAGEAL ECHOCARDIOGRAM (TEE);  Surgeon: Antoine Poche, MD;  [facility];  Service: Cardiology;  Laterality: N/A;   TEE WITHOUT CARDIOVERSION N/A 07/20/2014   Procedure: TRANSESOPHAGEAL ECHOCARDIOGRAM (TEE) WITH PROPOFOL;  Surgeon: Antoine Poche, MD;  Location: [facility];  Service: Endoscopy;  Laterality: N/A;   TEE WITHOUT CARDIOVERSION N/A 10/07/2014   Procedure: TRANSESOPHAGEAL ECHOCARDIOGRAM (TEE);  Surgeon: Purcell Nails, MD;  Location: [facility] OR;  Service: Open Heart Surgery;  Laterality: N/A;   Allergies  Allergen Reactions   Iodinated Contrast Media Anaphylaxis, Shortness Of Breath, Itching, Swelling and Other (See Comments)    Patient was given Omni 350 and suffered from itching, chest pain and shortness of breath. Patient was taken to ED after onset of reaction. 10/11/2021  MD zackowski noted pt had tongue and throat swelling, had to give pt epinephrine    Sulfa Antibiotics Nausea And Vomiting   Prior to Admission medications   Medication Sig Start Date End Date Taking? Authorizing Provider  acetaminophen (TYLENOL) 325 MG tablet Take 650 mg by mouth every 6 (six) hours as needed for mild pain, moderate pain, fever or headache.   Yes [provider]  aspirin EC 81 MG tablet Take 1 tablet (81 mg total) by mouth daily. Swallow whole. 12/16/19  Yes BranchDorothe Pea, MD  Cholecalciferol (VITAMIN D-3 PO) Take 1 capsule by mouth in the morning and at bedtime.   Yes [provider]  clopidogrel (PLAVIX) 75 MG tablet Take 1  tablet (75 mg total) by mouth daily. 02/09/23  Yes Hoyt Koch, PA  estradiol (ESTRACE) 2 MG tablet Take 1 tablet (2 mg total) by mouth daily. 01/19/21  Yes Shade Flood, MD  ezetimibe (ZETIA) 10  MG tablet TAKE 1 TABLET BY MOUTH DAILY 12/05/22  Yes Branch, Dorothe Pea, MD  Flaxseed, Linseed, (FLAX SEED OIL PO) Take 1 capsule by mouth in the morning.   Yes [provider]  fluticasone (FLONASE) 50 MCG/ACT nasal spray Place 1 spray into both nostrils 2 (two) times daily. 12/07/22  Yes Particia Nearing, PA-C  hydrocortisone (ANUSOL-HC) 2.5 % rectal cream Place 1 Application rectally 2 (two) times daily. As needed for rectal bleeding. Patient taking differently: Place 1 Application rectally 2 (two) times daily as needed for hemorrhoids (rectal bleeding). 12/06/21  Yes Gelene Mink, NP  losartan (COZAAR) 25 MG tablet TAKE 1 TABLET BY MOUTH DAILY 02/26/23  Yes Shade Flood, MD  metoprolol tartrate (LOPRESSOR) 25 MG tablet TAKE 1 TABLET BY MOUTH TWICE  DAILY 01/08/23  Yes Branch, Dorothe Pea, MD  Multiple Minerals (CALCIUM-MAGNESIUM-ZINC) TABS Take 1 tablet by mouth in the morning.   Yes [provider]  NP THYROID 120 MG tablet Take 60 mg by mouth daily before breakfast. 01/11/22  Yes [provider]  progesterone (PROMETRIUM) 200 MG capsule Take 2 capsules (400 mg total) by mouth daily. Patient taking differently: Take 400 mg by mouth at bedtime. 01/19/21 10/03/23 Yes Shade Flood, MD  RABEprazole (ACIPHEX) 20 MG tablet Take 1 tablet (20 mg total) by mouth 2 (two) times daily before a meal. 03/12/23  Yes Gelene Mink, NP  rosuvastatin (CRESTOR) 40 MG tablet TAKE 1 TABLET BY MOUTH DAILY 10/09/22  Yes Branch, Dorothe Pea, MD  triamcinolone ointment (KENALOG) 0.1 % Apply 1 Application topically 2 (two) times daily as needed. Patient taking differently: Apply 1 Application topically 2 (two) times daily as needed (irritation). 11/25/21  Yes Alfonse Spruce, MD   Social History   Socioeconomic History   Marital status: Married    Spouse name: Raschie   Number of children: 0   Years of education: some coll   Highest education level: Some college, no degree  Occupational History   Occupation: Financial planner: [employer]    Comment: [employer]  Tobacco Use   Smoking status: Every Day    Current packs/day: 0.00    Average packs/day: 0.5 packs/day for 40.0 years (20.0 ttl pk-yrs)    Types: Cigarettes    Start date: 05/1981    Last attempt to quit: 05/2021    Years since quitting: 1.9   Smokeless tobacco: Never  Vaping Use   Vaping status: Never Used  Substance and Sexual Activity   Alcohol use: Yes    Alcohol/week: 2.0 standard drinks of alcohol    Types: 2 Cans of beer per week   Drug use: No   Sexual activity: Yes    Birth control/protection: None  Other Topics Concern   Not on file  Social History Narrative   Patient is married   for 5 years . Patient works at [employer].. 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   Social Drivers of Corporate investment banker Strain: Low Risk  (04/16/2023)   Overall Financial Resource Strain (CARDIA)    Difficulty of Paying Living Expenses: Not hard at all  Food Insecurity: No Food Insecurity (04/16/2023)   Hunger Vital Sign    Worried About Running Out of Food in the Last Year: Never true    Ran Out of Food in the Last Year: Never true  Transportation Needs: No Transportation Needs (04/16/2023)   PRAPARE - Transportation  Lack of Transportation (Medical): No    Lack of Transportation (Non-Medical): No  Physical Activity: Insufficiently Active (04/16/2023)   Exercise Vital Sign    Days of Exercise per Week: 2 days    Minutes of Exercise per Session: 30 min  Stress: Stress Concern Present (04/16/2023)   [employer] of Occupational Health - Occupational Stress Questionnaire    Feeling of Stress : To some extent  Social  Connections: Socially Isolated (04/16/2023)   Social Connection and Isolation Panel [NHANES]    Frequency of Communication with Friends and Family: Once a week    Frequency of Social Gatherings with Friends and Family: Once a week    Attends Religious Services: Never    Database administrator or Organizations: No    Attends Engineer, structural: Not on file    Marital Status: Married  Catering manager Violence: Not At Risk (03/22/2022)   Humiliation, Afraid, Rape, and Kick questionnaire    Fear of Current or Ex-Partner: No    Emotionally Abused: No    Physically Abused: No    Sexually Abused: No    Review of Systems Per HPI.   Objective:   Vitals:   04/19/23 1503  BP: (!) 144/80  Pulse: 78  Temp: 98.4 F (36.9 C)  TempSrc: Temporal  SpO2: 98%  Weight: 235 lb 12.8 oz (107 kg)  Height: 5\' 6"  (1.676 m)    Physical Exam Vitals reviewed.  Constitutional:      Appearance: Normal appearance. She is well-developed.  HENT:     Head: Normocephalic and atraumatic.  Eyes:     Conjunctiva/sclera: Conjunctivae normal.     Pupils: Pupils are equal, round, and reactive to light.  Neck:     Vascular: No carotid bruit.  Cardiovascular:     Rate and Rhythm: Normal rate and regular rhythm.     Heart sounds: Normal heart sounds.  Pulmonary:     Effort: Pulmonary effort is normal.     Breath sounds: Normal breath sounds. No wheezing, rhonchi or rales.     Comments: speaking in full sentences.  Abdominal:     Palpations: Abdomen is soft. There is no pulsatile mass.     Tenderness: There is no abdominal tenderness.  Musculoskeletal:     Right lower leg: No edema.     Left lower leg: No edema.  Skin:    General: Skin is warm and dry.  Neurological:     Mental Status: She is alert and oriented to person, place, and time.  Psychiatric:        Mood and Affect: Mood normal.        Behavior: Behavior normal.    60 minutes spent during visit, including chart review,  counseling and assimilation of information regarding her fatigue, chest symptoms above as well as discussing past medical history with the symptoms, exam, discussion of plan, and chart completion.  This time is exclusive of EKG interpretation.  EKG: Sinus rhythm, rate 77.  Nonspecific ST in II, III, appears unchanged from 03/14/2022 comparison EKG.  No apparent significant changes in remainder of EKG from 03/14/2022 comparison   Assessment & Plan:  Shahidah Hanif is a 67 y.o. female . Fatigue, unspecified type - Plan: CBC with Differential/Platelet, Comprehensive metabolic panel, TSH  Chronic systolic congestive heart failure (HCC) - Plan: Brain natriuretic peptide, CBC with Differential/Platelet, Comprehensive metabolic panel, DG Chest 2 View  Chronic mesenteric ischemia (HCC)  PAD (peripheral artery disease) (HCC)  Chest pain, unspecified  type - Plan: Brain natriuretic peptide, CBC with Differential/Platelet, DG Chest 2 View, EKG 12-Lead  Few month history of fatigue as above.  Fleeting lower chest discomfort, atypical chest pain.  Reassuring exam in office.  Nonspecific symptoms.  Possibly multifactorial fatigue with work stressors, untreated sleep apnea, but has had a history of CHF, mesenteric ischemia, other chronic conditions as above.  Denies recent bowel changes or abdominal pain.  Less likely ischemia flare.  Symptoms started after she had been back to her baseline from COVID infection, less likely long COVID syndrome.  She is on hormone replacements followed by [facility], no recent TSH.  Possible over or under treatment.  -EKG as above without acute findings, but recommended she contact her cardiologist this fatigue, intermittent chest symptoms.  Borderline blood pressure at this time, home monitoring recommended for now.  -Check chest x-ray, BNP given her attribution of similar symptoms prior to CHF in 2015, but reassuring exam.  Advised to contact cardiology as above to determine if  other testing needed.  -Check CBC to rule out anemia, CMP to rule out electrolyte abnormality.  -Check TSH given supplementation with NP thyroid, no recent levels noted.  -2-week follow-up to discuss fatigue further with RTC/ER precautions given.  All questions answered.  No orders of the defined types were placed in this encounter.  Patient Instructions  I will check some labs. Please have xray performed at [facility]. I do recommend contacting your cardiologist about your symptoms including the chest pains.  If any acute worsening of your symptoms be seen as we discussed.     Signed,   Meredith Staggers, MD  Primary Care, [facility] 04/19/23 4:05 PM

## 2023-04-19 NOTE — Patient Instructions (Addendum)
I will check some labs. Please have xray performed at [facility]. I do recommend contacting your cardiologist about your symptoms including the chest pains.  Keep a record of your blood pressures at home and bring those to your next visit.  I did not change any medications at this time.  Follow-up in 2 weeks and we can discuss labs and further plans at that time. Return to the clinic or go to the nearest emergency room if any of your symptoms worsen or new symptoms occur.   There is a recommendation for RSV vaccine for patients over age 66. Typically would recommend the RSV vaccine as most important for patients over age 66 that have comorbidities that put them at increased risk for severe disease (heart disease such as congestive heart failure, coronary artery disease, lung disease such as asthma or COPD, kidney disease, liver disease, diabetes, chronic or progressive neurologic or muscular conditions, immunosuppressed, or being frail or of advanced age).  For others that do not have these risk factors, there still is some benefit from vaccination since age is one of the main risk factors for developing severe disease however baseline risk of developing severe disease and requiring hospitalization is likely to be lower compared to those that have comorbidities in addition to age.   CDC does have some information as well: ToyProtection.fi

## 2023-04-20 LAB — COMPREHENSIVE METABOLIC PANEL
ALT: 15 U/L (ref 0–35)
AST: 19 U/L (ref 0–37)
Albumin: 4.2 g/dL (ref 3.5–5.2)
Alkaline Phosphatase: 57 U/L (ref 39–117)
BUN: 14 mg/dL (ref 6–23)
CO2: 25 meq/L (ref 19–32)
Calcium: 9.2 mg/dL (ref 8.4–10.5)
Chloride: 105 meq/L (ref 96–112)
Creatinine, Ser: 0.93 mg/dL (ref 0.40–1.20)
GFR: 64.2 mL/min (ref >60.00)
Glucose, Bld: 88 mg/dL (ref 70–99)
Potassium: 4.3 meq/L (ref 3.5–5.1)
Sodium: 139 meq/L (ref 135–145)
Total Bilirubin: 0.3 mg/dL (ref 0.2–1.2)
Total Protein: 7.2 g/dL (ref 6.0–8.3)

## 2023-04-20 LAB — CBC WITH DIFFERENTIAL/PLATELET
Basophils Absolute: 0.1 10*3/uL (ref 0.0–0.1)
Basophils Relative: 1.3 % (ref 0.0–3.0)
Eosinophils Absolute: 0.2 10*3/uL (ref 0.0–0.7)
Eosinophils Relative: 2.4 % (ref 0.0–5.0)
HCT: 39.6 % (ref 36.0–46.0)
Hemoglobin: 13 g/dL (ref 12.0–15.0)
Lymphocytes Relative: 33.4 % (ref 12.0–46.0)
Lymphs Abs: 2.9 10*3/uL (ref 0.7–4.0)
MCHC: 32.8 g/dL (ref 30.0–36.0)
MCV: 85.2 fL (ref 78.0–100.0)
Monocytes Absolute: 0.8 10*3/uL (ref 0.1–1.0)
Monocytes Relative: 9 % (ref 3.0–12.0)
Neutro Abs: 4.6 10*3/uL (ref 1.4–7.7)
Neutrophils Relative %: 53.9 % (ref 43.0–77.0)
Platelets: 292 10*3/uL (ref 150.0–400.0)
RBC: 4.65 Mil/uL (ref 3.87–5.11)
RDW: 14.8 % (ref 11.5–15.5)
WBC: 8.6 10*3/uL (ref 4.0–10.5)

## 2023-04-20 LAB — BRAIN NATRIURETIC PEPTIDE: Pro B Natriuretic peptide (BNP): 155 pg/mL — ABNORMAL HIGH (ref 0.0–100.0)

## 2023-04-20 LAB — TSH: TSH: 1.23 u[IU]/mL (ref 0.35–5.50)

## 2023-04-27 ENCOUNTER — Ambulatory Visit: Payer: Medicare Other | Attending: Cardiology | Admitting: Cardiology

## 2023-04-27 ENCOUNTER — Encounter: Payer: Self-pay | Admitting: Cardiology

## 2023-04-27 VITALS — BP 128/80 | HR 82 | Ht 66.0 in | Wt 233.2 lb

## 2023-04-27 DIAGNOSIS — R079 Chest pain, unspecified: Secondary | ICD-10-CM

## 2023-04-27 DIAGNOSIS — Z952 Presence of prosthetic heart valve: Secondary | ICD-10-CM | POA: Diagnosis not present

## 2023-04-27 DIAGNOSIS — G4733 Obstructive sleep apnea (adult) (pediatric): Secondary | ICD-10-CM

## 2023-04-27 DIAGNOSIS — R0789 Other chest pain: Secondary | ICD-10-CM

## 2023-04-27 NOTE — Progress Notes (Signed)
 Clinical Summary Michelle Patton is a 68 y.o.female seen today for follow up of the following medicla problems    1.Chest pain/fatigue - feeling exhausted. Low energy, feels drained.Some hypersomnolence. Sleeps 7.5 to 8 hours per night. - sedentary lifestyle, no specific exertional symptoms  - chest pain under right breast, sharp pain. Can take her breath away. Lasts 1-2 minutes. 1-2 times per week, occur at rest or with activity. Not positional. No other associated symptoms.  - recent episode mid to left chest, same type of feeling.  - normal TSH, Hgb    Other medical problems not addressed this visit  1. Aortic stenosis -  hx in 1968 at age of 48 of supravavlular aortic stenosis, corrective surgery with patching at that time.   - found to have developed severe aortic valvular stenosis with possible recurrence of supravalvular stenosis as well based on imaging. 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   - Medtronic Freestyle stentless porcine valve/aortic root graft (size 21 mm, model # 995, serial # [id number])       09/2017 echo LVEF 65-70%, no WMAS, grade I diastolic dysfunction, normal AVR.   -12/2021 echo: LVEF 65-70%, 21 mm Medtronic Freestyle stentless porcine valve. Peak and  mean gradients through the valve are 13 and 5 mm Hg respectively.. The  aortic valve has been repaired/replaced. Aortic valve regurgitation is not  visualized.    -  no SOB/DOE, no chest pains.        2. PAD - followed by vascular, history of left external iliac stent.   - no recent smptoms.      3. HTN - she is compliant iwht meds  - cough on ACE-I, discontinued - reported some insomina initialyl with losartan  but unclear if truly related     - compliant with meds   4. 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  - she is on ASA/plavix  per IR. She reports was told need lifelong plavix  due to multiple SMA stents over time.         6. OSA - moderate OSA by 08/2020. At this time was 201 lbs.  - cpap machine was expensive.      7. COVID + 12/07/22       SH: [name] is also a patient of mine. 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      Trip to [place] to visit her mother. Born and raised in [place],moved to [place]. Father passed in February.  Enjoys riding motorcycles Past Medical History:  Diagnosis Date   Aortic regurgitation 07/20/2014   Aortic stenosis, severe 07/20/2014   Arthritis    DVT (deep venous thrombosis) (HCC)    Family history of adverse reaction to anesthesia    Reports father deliurm in his 49's with CABG   GERD (gastroesophageal reflux disease)    History of pneumonia    Hx of repair of rotator cuff  R and L rotator cuff repair   Hyperlipidemia    Hypertension    Insomnia, unspecified    patient stated d/t menopause   Mesenteric ischemia (HCC)    Muscle weakness (generalized)    Peripheral artery disease (HCC)    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   Redo sternotomy for 21 mm Medtronic Freestyle porcine aortic root graft w/ reimplantation of left main and right coronary arteries   Sleep apnea    diagnosed multiple years ago at [facility] aortic stenosis, congenital - s/p repair during childhood      Allergies  Allergen Reactions   Iodinated Contrast Media Anaphylaxis, Shortness Of Breath, Itching, Swelling and Other (See Comments)    Patient was given Omni 350 and suffered from itching,  chest pain and shortness of breath. Patient was taken to ED after onset of reaction. 10/11/2021  MD zackowski noted pt had tongue and throat swelling, had to give pt epinephrine     Sulfa Antibiotics Nausea And Vomiting     Current Outpatient Medications  Medication Sig Dispense Refill   acetaminophen  (TYLENOL ) 325 MG tablet Take 650 mg by mouth every 6 (six) hours as needed for mild pain, moderate pain, fever or headache.     aspirin  EC 81 MG tablet Take 1 tablet (81 mg total) by mouth daily. Swallow whole. 90 tablet 3   Cholecalciferol (VITAMIN D -3 PO) Take 1 capsule by mouth in the morning and at bedtime.     clopidogrel  (PLAVIX ) 75 MG tablet Take 1 tablet (75 mg total) by mouth daily. 90 tablet 3   estradiol  (ESTRACE ) 2 MG tablet Take 1 tablet (2 mg total) by mouth daily. 30 tablet 3   ezetimibe  (ZETIA ) 10 MG tablet TAKE 1 TABLET BY MOUTH DAILY 90 tablet 3   Flaxseed, Linseed, (FLAX SEED OIL PO) Take 1 capsule by mouth in the morning.     fluticasone  (FLONASE ) 50 MCG/ACT nasal spray Place 1 spray into both nostrils 2 (two) times daily. 16 g 2   hydrocortisone  (ANUSOL -HC) 2.5 % rectal cream Place 1 Application rectally 2 (two) times daily. As needed for rectal bleeding. (Patient taking differently: Place 1 Application rectally 2 (two) times daily as needed for hemorrhoids (rectal bleeding).) 30 g 1   losartan  (COZAAR ) 25 MG tablet TAKE 1 TABLET BY MOUTH DAILY 90 tablet 3   metoprolol  tartrate (LOPRESSOR ) 25 MG tablet TAKE 1 TABLET BY MOUTH TWICE  DAILY 180 tablet 3   Multiple Minerals (CALCIUM -MAGNESIUM -ZINC) TABS Take 1 tablet by mouth in the morning.     NP THYROID  120 MG tablet Take 60 mg by mouth daily before breakfast.     progesterone  (PROMETRIUM ) 200 MG capsule Take 2 capsules (400 mg total) by mouth daily. (Patient taking differently: Take 400 mg by mouth at bedtime.) 180 capsule 1   RABEprazole  (ACIPHEX ) 20 MG tablet Take 1 tablet (20 mg total) by mouth 2 (two) times daily before a  meal. 180 tablet 3   rosuvastatin  (CRESTOR ) 40 MG tablet TAKE 1 TABLET BY MOUTH DAILY 90 tablet 3   triamcinolone  ointment (KENALOG ) 0.1 % Apply 1 Application topically 2 (two) times daily as needed. (Patient taking differently: Apply 1 Application topically 2 (two) times daily as needed (irritation).) 454 g 0   No current facility-administered medications for this visit.     Past Surgical History:  Procedure Laterality Date   ABDOMINAL AORTAGRAM  06/24/2012   ABDOMINAL AORTAGRAM N/A 06/24/2012  Procedure: ABDOMINAL AORTAGRAM;  Surgeon: Lonni GORMAN Blade, MD;  Location: [facility] CATH LAB;  Service: Cardiovascular;  Laterality: N/A;   AORTIC VALVE REPLACEMENT N/A 10/07/2014   Procedure: REDO AORTIC VALVE REPLACEMENT (AVR);  Surgeon: Sudie VEAR Laine, MD;  [facility];  Service: Open Heart Surgery;  Laterality: N/A;   ASCENDING AORTIC ROOT REPLACEMENT N/A 10/07/2014   Procedure: ASCENDING AORTIC ROOT REPLACEMENT;  Surgeon: Sudie VEAR Laine, MD;  Location: [facility] OR;  Service: Open Heart Surgery;  Laterality: N/A;   BIOPSY  09/27/2016   Procedure: BIOPSY;  Surgeon: Shaaron Lamar HERO, MD;  Location: [facility] ENDO SUITE;  Service: Endoscopy;;  colon   BIOPSY  10/18/2020   Procedure: BIOPSY;  Surgeon: Cindie Carlin POUR, DO;  Location: [facility];  Service: Endoscopy;;   BREAST REDUCTION SURGERY Bilateral 01/21/2018   Procedure: BREAST REDUCTION WITH LIPOSUCTION;  Surgeon: Marcus Lung, MD;  Location: [facility];  Service: Plastics;  Laterality: Bilateral;   CARDIAC VALVE SURGERY  1968   CARPAL TUNNEL RELEASE Right 03/02/2020   Procedure: CARPAL TUNNEL RELEASE;  Surgeon: Margrette Taft BRAVO, MD;  Location: [facility] ORS;  Service: Orthopedics;  Laterality: Right;   CATARACT EXTRACTION W/PHACO Left 05/30/2019   Procedure: CATARACT EXTRACTION PHACO AND INTRAOCULAR LENS PLACEMENT (IOC) (CDE: 4.94  );  Surgeon: Harrie Agent, MD;  Location: [facility] ORS;  Service: Ophthalmology;  Laterality: Left;    CATARACT EXTRACTION W/PHACO Right 06/16/2019   Procedure: CATARACT EXTRACTION PHACO AND INTRAOCULAR LENS PLACEMENT (IOC);  Surgeon: Harrie Agent, MD;  Location: [facility] ORS;  Service: Ophthalmology;  Laterality: Right;  CDE: 4.64   CERVICAL FUSION     CHOLECYSTECTOMY     COLONOSCOPY WITH PROPOFOL  N/A 09/27/2016   Dr. Shaaron: Diverticulosis, several tubular adenomas removed ranging 4 to 7 mm in size, internal grade 1 hemorrhoids, terminal ileum normal, segmental biopsies negative for microscopic colitis.  Next colonoscopy June 2021   COLONOSCOPY WITH PROPOFOL  N/A 05/06/2020   internal hemorrhoids, sigmoid diverticulosis. Surveillance colonoscopy due in 2027   COLONOSCOPY WITH PROPOFOL  N/A 10/06/2022   Procedure: COLONOSCOPY WITH PROPOFOL ;  Surgeon: Eartha Angelia Sieving, MD;  [facility];  Service: Gastroenterology;  Laterality: N/A;  10:45AM;ASA 1   ESOPHAGOGASTRODUODENOSCOPY (EGD) WITH PROPOFOL  N/A 09/27/2016   Dr. Shaaron: Small hiatal hernia, mild Schatzki ring status post disruption, LA grade a esophagitis   ESOPHAGOGASTRODUODENOSCOPY (EGD) WITH PROPOFOL  N/A 10/18/2020   non-obstructing mild Schatzki ring, gastritis s/ biopsy. Negative H.pylori.   HEMOSTASIS CLIP PLACEMENT  10/06/2022   Procedure: HEMOSTASIS CLIP PLACEMENT;  Surgeon: Eartha Angelia Sieving, MD;  [facility];  Service: Gastroenterology;;   ILIAC ARTERY STENT Left 12/2007   IR ANGIOGRAM EXTREMITY BILATERAL  07/12/2021   IR ANGIOGRAM VISCERAL SELECTIVE  10/29/2020   IR ANGIOGRAM VISCERAL SELECTIVE  07/12/2021   IR ANGIOGRAM VISCERAL SELECTIVE  03/21/2022   IR AORTAGRAM ABDOMINAL SERIALOGRAM  03/21/2022   IR IVUS EACH ADDITIONAL NON CORONARY VESSEL  07/12/2021   IR IVUS EACH ADDITIONAL NON CORONARY VESSEL  03/21/2022   IR PTA NON CORO-LOWER EXTREM  03/21/2022   IR RADIOLOGIST EVAL & MGMT  10/26/2020   IR RADIOLOGIST EVAL & MGMT  11/10/2020   IR RADIOLOGIST EVAL & MGMT  02/09/2021   IR RADIOLOGIST EVAL  & MGMT  06/16/2021   IR RADIOLOGIST EVAL & MGMT  08/15/2021   IR RADIOLOGIST EVAL & MGMT  10/18/2021   IR RADIOLOGIST EVAL & MGMT  01/11/2022   IR RADIOLOGIST EVAL & MGMT  03/02/2022  IR RADIOLOGIST EVAL & MGMT  05/11/2022   IR RADIOLOGIST EVAL & MGMT  08/18/2022   IR RADIOLOGIST EVAL & MGMT  10/18/2022   IR THROMBECT PRIM MECH INIT (INCLU) MOD SED  03/21/2022   IR THROMBECT SEC MECH MOD SED  07/12/2021   IR TRANSCATH PLC STENT 1ST ART NOT LE CV CAR VERT CAR  10/29/2020   IR TRANSCATH PLC STENT 1ST ART NOT LE CV CAR VERT CAR  07/12/2021   IR US  GUIDE VASC ACCESS RIGHT  10/29/2020   IR US  GUIDE VASC ACCESS RIGHT  07/12/2021   IR US  GUIDE VASC ACCESS RIGHT  03/21/2022   KNEE ARTHROSCOPY WITH MEDIAL MENISECTOMY Right 01/10/2018   Procedure: RIGHT KNEE ARTHROSCOPY WITH PARTIAL MEDIAL MENISECTOMY;  Surgeon: Margrette Taft BRAVO, MD;  Location: [facility];  Service: Orthopedics;  Laterality: Right;   LAPAROSCOPIC APPENDECTOMY N/A 08/03/2022   Procedure: APPENDECTOMY LAPAROSCOPIC;  Surgeon: Rubin Calamity, MD;  Location: [facility] OR;  Service: General;  Laterality: N/A;   LEFT AND RIGHT HEART CATHETERIZATION WITH CORONARY ANGIOGRAM N/A 07/31/2014   Procedure: LEFT AND RIGHT HEART CATHETERIZATION WITH CORONARY ANGIOGRAM;  Surgeon: Lonni JONETTA Cash, MD;  Location: [facility] CATH LAB;  Service: Cardiovascular;  Laterality: N/A;   MALONEY DILATION N/A 09/27/2016   Procedure: AGAPITO DILATION;  Surgeon: Shaaron Lamar HERO, MD;  [facility];  Service: Endoscopy;  Laterality: N/A;   POLYPECTOMY  09/27/2016   Procedure: POLYPECTOMY;  Surgeon: Shaaron Lamar HERO, MD;  [facility];  Service: Endoscopy;;  colon   POLYPECTOMY  10/06/2022   Procedure: POLYPECTOMY;  Surgeon: Eartha Angelia Sieving, MD;  [facility];  Service: Gastroenterology;;   ROTATOR CUFF REPAIR Bilateral    SUBMUCOSAL LIFTING INJECTION  10/06/2022   Procedure: SUBMUCOSAL LIFTING INJECTION;  Surgeon: Eartha Angelia Sieving,  MD;  [facility];  Service: Gastroenterology;;   TEE WITHOUT CARDIOVERSION N/A 07/07/2014   Procedure: TRANSESOPHAGEAL ECHOCARDIOGRAM (TEE);  Surgeon: Dorn JULIANNA Ross, MD;  [facility];  Service: Cardiology;  Laterality: N/A;   TEE WITHOUT CARDIOVERSION N/A 07/20/2014   Procedure: TRANSESOPHAGEAL ECHOCARDIOGRAM (TEE) WITH PROPOFOL ;  Surgeon: Dorn JULIANNA Ross, MD;  Location: [facility];  Service: Endoscopy;  Laterality: N/A;   TEE WITHOUT CARDIOVERSION N/A 10/07/2014   Procedure: TRANSESOPHAGEAL ECHOCARDIOGRAM (TEE);  Surgeon: Sudie VEAR Laine, MD;  Location: [facility] OR;  Service: Open Heart Surgery;  Laterality: N/A;     Allergies  Allergen Reactions   Iodinated Contrast Media Anaphylaxis, Shortness Of Breath, Itching, Swelling and Other (See Comments)    Patient was given Omni 350 and suffered from itching, chest pain and shortness of breath. Patient was taken to ED after onset of reaction. 10/11/2021  MD zackowski noted pt had tongue and throat swelling, had to give pt epinephrine     Sulfa Antibiotics Nausea And Vomiting      Family History  Problem Relation Age of Onset   Hypertension Mother    Hypertension Father    Heart disease Father        before age 32   Other Father        varicose veins   COPD Father    Colon cancer Neg Hx    Celiac disease Neg Hx    Inflammatory bowel disease Neg Hx      Social History Ms. Deitrick reports that she has been smoking cigarettes. She started smoking about 41 years ago. She has a 20 pack-year smoking history. She has never used smokeless tobacco.  Ms. Chesnut reports current alcohol use of about 2.0 standard drinks of alcohol per week.   Review of Systems CONSTITUTIONAL: No weight loss, fever, chills, weakness or fatigue.  HEENT: Eyes: No visual loss, blurred vision, double vision or yellow sclerae.No hearing loss, sneezing, congestion, runny nose or sore throat.  SKIN: No rash or itching.  CARDIOVASCULAR: per  hpi RESPIRATORY: No shortness of breath, cough or sputum.  GASTROINTESTINAL: No anorexia, nausea, vomiting or diarrhea. No abdominal pain or blood.  GENITOURINARY: No burning on urination, no polyuria NEUROLOGICAL: No headache, dizziness, syncope, paralysis, ataxia, numbness or tingling in the extremities. No change in bowel or bladder control.  MUSCULOSKELETAL: No muscle, back pain, joint pain or stiffness.  LYMPHATICS: No enlarged nodes. No history of splenectomy.  PSYCHIATRIC: No history of depression or anxiety.  ENDOCRINOLOGIC: No reports of sweating, cold or heat intolerance. No polyuria or polydipsia.  SABRA   Physical Examination Today's Vitals   04/27/23 1445  BP: 128/80  Pulse: 82  SpO2: 100%  Weight: 233 lb 3.2 oz (105.8 kg)  Height: 5' 6 (1.676 m)   Body mass index is 37.64 kg/m.  Gen: resting comfortably, no acute distress HEENT: no scleral icterus, pupils equal round and reactive, no palptable cervical adenopathy,  CV: RRR, no mrg, no jvd Resp: Clear to auscultation bilaterally GI: abdomen is soft, non-tender, non-distended, normal bowel sounds, no hepatosplenomegaly MSK: extremities are warm, no edema.  Skin: warm, no rash Neuro:  no focal deficits Psych: appropriate affect   Diagnostic Studies  11/2015 echo Study Conclusions   - Left ventricle: The cavity size was normal. Wall thickness was   normal. Systolic function was vigorous. The estimated ejection   fraction was in the range of 65% to 70%. Wall motion was normal;   there were no regional wall motion abnormalities. Doppler   parameters are consistent with abnormal left ventricular   relaxation (grade 1 diastolic dysfunction). - Aortic valve: 21 mm Medtronic Freestyle stentless porcine   valve/aortic root graft in place. There was no significant   regurgitation. Peak gradient (S): 19 mm Hg. Valve area (Vmax):   1.83 cm^2. - Mitral valve: Calcified annulus. There was trivial regurgitation. - Right  ventricle: The cavity size was mildly dilated. - Right atrium: Central venous pressure (est): 3 mm Hg. - Atrial septum: The septum bowed from right to left, consistent   with increased right atrial pressure. - Tricuspid valve: There was mild regurgitation. - Pulmonic valve: Peak gradient (S): 19 mm Hg. - Pulmonary arteries: PA peak pressure: 21 mm Hg (S). - Pericardium, extracardiac: There was no pericardial effusion.   Impressions:   - Normal LV wall thickness with LVEF 65-70%. Grade 1 diastolic   dysfunction. MAC with trivial mitral regurgitation. Bioprosthesis   in aortic position as described above with no significant   perivalvular regurgitation and peak gradient 19 mmHg. Mild RV   enlargement. Mild tricuspid regurgitation with PASP estimated 21   mmHg.    07/2014 cath Hemodynamic Findings: Ao:   107/55             LV: 190/12/15 RA:  1         RV:  41/2/6 PA:   26/6 (mean 15)     PCWP:  7 Fick Cardiac Output: 4.76 L/min Fick Cardiac Index: 2.38 L/min/m2 Central Aortic Saturation: 96% Pulmonary Artery Saturation: 66%   Aortic valve data:  Peak to peak gradient 83 mm Hg Mean gradient 56 mmHg AVA 0.52 cm2   Angiographic  Findings:   Left main: Normal caliber vessel with no obstructive disease.    Left Anterior Descending Artery: Large caliber vessel that courses to the apex. Moderate caliber diagonal Tyrhonda Georgiades. No obstructive disease.    Circumflex Artery: Large caliber vessel with moderate caliber obtuse marginal Tomeko Scoville. No obstructive disease.    Right Coronary Artery: Large dominant vessel with no obstructive disease.    Left Ventricular Angiogram: LVEF=60%   Aortic root angiogram: The aortic root is not enlarged. There is supravalvular density.    Aortic valve calcification noted on plain fluoroscopy.    Impression: 1. No angiographic evidence of CAD 2. Normal LV systolic function 3. Severe gradient across the aortic valve into the aorta with density noted in  the aortic root. Cannot exclude recurrent supravalvular stenosis along with aortic valve stenosis. 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  4. Normal filling pressures     09/2017 echo Study Conclusions   - Left ventricle: The cavity size was normal. Wall thickness was   normal. Systolic function was vigorous. The estimated ejection   fraction was in the range of 65% to 70%. Wall motion was normal;   there were no regional wall motion abnormalities. Doppler   parameters are consistent with abnormal left ventricular   relaxation (grade 1 diastolic dysfunction). Doppler parameters   are consistent with indeterminate ventricular filling pressure. - Aortic valve: 21 mm Medtronic Freestyle stentless porcine   valve/aortic root graft in place. There was no significant   regurgitation. There was no stenosis. Peak gradient (S): 11 mm   Hg. - Mitral valve: Mildly calcified annulus. Normal thickness leaflets   . - Tricuspid valve: There was mild regurgitation. - Pulmonary arteries: PA peak pressure: 35 mm Hg (S).   12/2021 carotid US  IMPRESSION: Color duplex indicates minimal heterogeneous plaque, with no hemodynamically significant stenosis by duplex criteria in the extracranial cerebrovascular circulation.         12/2021 echo 1. Left ventricular ejection fraction, by estimation, is 65 to 70%. The  left ventricle has normal function. The left ventricle has no regional  wall motion abnormalities. Left ventricular diastolic parameters are  indeterminate.   2. Right ventricular systolic function is normal. The right ventricular  size is normal. There is mildly elevated pulmonary artery systolic  pressure.   3. Right atrial size was mildly dilated.   4. The mitral valve is normal in structure. Mild mitral valve  regurgitation.   5. S/p AVR. (21 mm Medtronic Freestyle stentless porcine valve. Peak and  mean gradients through the valve are 13 and 5  mm Hg respectively.. The  aortic valve has been repaired/replaced. Aortic valve regurgitation is not  visualized.   6. The inferior vena cava is normal in size with greater than 50%  respiratory variability, suggesting right atrial pressure of 3 mmHg.      Assessment and Plan   1. Chest pain - not consistent with ischemic chest pain - she does have history of aortic valve/aortic root replacement. Symptoms could be suggestive of aortic pathology.  - severe CT contrast allergy, will order MRA   2. Fatigue - generalized fatigue, hypersomnolence - these symptoms are not cardiac - likely from untreated OSA. She has moderate OSA from 2022 sleep study, has gained 30 lbs since then. - refer to pulmonary Dr Jude Dorn PHEBE Alvan, M.D.

## 2023-04-27 NOTE — Patient Instructions (Addendum)
 Medication Instructions:   Continue all current medications.   Labwork:  none  Testing/Procedures:  MRA of chest  Office will contact with results via phone, letter or mychart.    Follow-Up:  Keep March appointment as planned   Any Other Special Instructions Will Be Listed Below (If Applicable).  You have been referred to:  Pulnonology   If you need a refill on your cardiac medications before your next appointment, please call your pharmacy.

## 2023-04-30 ENCOUNTER — Ambulatory Visit: Payer: Medicare Other | Admitting: Family Medicine

## 2023-04-30 ENCOUNTER — Other Ambulatory Visit: Payer: Self-pay | Admitting: *Deleted

## 2023-04-30 DIAGNOSIS — R079 Chest pain, unspecified: Secondary | ICD-10-CM

## 2023-05-01 ENCOUNTER — Other Ambulatory Visit: Payer: Self-pay | Admitting: Interventional Radiology

## 2023-05-01 ENCOUNTER — Telehealth: Payer: Self-pay | Admitting: Cardiology

## 2023-05-01 DIAGNOSIS — K551 Chronic vascular disorders of intestine: Secondary | ICD-10-CM

## 2023-05-01 NOTE — Telephone Encounter (Signed)
 Checking percert on the following patient for testing scheduled at [facility].    MRA CHEST W/WO CONTRAST  05/14/2023

## 2023-05-07 ENCOUNTER — Ambulatory Visit: Payer: Medicare Other | Admitting: Family Medicine

## 2023-05-10 ENCOUNTER — Encounter (INDEPENDENT_AMBULATORY_CARE_PROVIDER_SITE_OTHER): Payer: Self-pay | Admitting: Gastroenterology

## 2023-05-10 ENCOUNTER — Other Ambulatory Visit (INDEPENDENT_AMBULATORY_CARE_PROVIDER_SITE_OTHER): Payer: Self-pay | Admitting: Gastroenterology

## 2023-05-10 DIAGNOSIS — F17200 Nicotine dependence, unspecified, uncomplicated: Secondary | ICD-10-CM

## 2023-05-10 MED ORDER — VARENICLINE TARTRATE 0.5 MG PO TABS: ORAL_TABLET | ORAL | 0 refills | Status: AC

## 2023-05-10 MED ORDER — VARENICLINE TARTRATE 1 MG PO TABS: 1.0000 mg | ORAL_TABLET | Freq: Two times a day (BID) | ORAL | 2 refills | Status: DC

## 2023-05-14 ENCOUNTER — Ambulatory Visit: Payer: Medicare Other | Admitting: Family Medicine

## 2023-05-14 ENCOUNTER — Encounter: Payer: Self-pay | Admitting: Family Medicine

## 2023-05-14 ENCOUNTER — Ambulatory Visit (HOSPITAL_COMMUNITY)
Admission: RE | Admit: 2023-05-14 | Discharge: 2023-05-14 | Disposition: A | Discharge status: Home / Self Care | Payer: Medicare Other | Source: Ambulatory Visit | Attending: Interventional Radiology | Admitting: Interventional Radiology

## 2023-05-14 ENCOUNTER — Ambulatory Visit (HOSPITAL_COMMUNITY)
Admission: RE | Admit: 2023-05-14 | Discharge: 2023-05-14 | Disposition: A | Discharge status: Home / Self Care | Payer: Medicare Other | Source: Ambulatory Visit | Attending: Cardiology | Admitting: Cardiology

## 2023-05-14 VITALS — BP 138/76 | HR 80 | Temp 98.1°F

## 2023-05-14 DIAGNOSIS — R9389 Abnormal findings on diagnostic imaging of other specified body structures: Secondary | ICD-10-CM | POA: Insufficient documentation

## 2023-05-14 DIAGNOSIS — R5383 Other fatigue: Secondary | ICD-10-CM

## 2023-05-14 DIAGNOSIS — R079 Chest pain, unspecified: Secondary | ICD-10-CM | POA: Insufficient documentation

## 2023-05-14 DIAGNOSIS — F439 Reaction to severe stress, unspecified: Secondary | ICD-10-CM

## 2023-05-14 DIAGNOSIS — F17210 Nicotine dependence, cigarettes, uncomplicated: Secondary | ICD-10-CM | POA: Diagnosis not present

## 2023-05-14 DIAGNOSIS — K551 Chronic vascular disorders of intestine: Secondary | ICD-10-CM | POA: Insufficient documentation

## 2023-05-14 DIAGNOSIS — Z95828 Presence of other vascular implants and grafts: Secondary | ICD-10-CM | POA: Insufficient documentation

## 2023-05-14 DIAGNOSIS — K55059 Acute (reversible) ischemia of intestine, part and extent unspecified: Secondary | ICD-10-CM | POA: Diagnosis not present

## 2023-05-14 DIAGNOSIS — Z952 Presence of prosthetic heart valve: Secondary | ICD-10-CM | POA: Insufficient documentation

## 2023-05-14 MED ORDER — GADOBUTROL 1 MMOL/ML IV SOLN
10.0000 mL | Freq: Once | INTRAVENOUS | Status: AC | PRN
  Administered 2023-05-14: 10 mL via INTRAVENOUS

## 2023-05-14 NOTE — Progress Notes (Unsigned)
Subjective:  Patient ID: Michelle Patton, female    DOB: February 02, 1956  Age: 68 y.o. MRN: [id number]  CC:  Chief Complaint  Patient presents with   Follow-up    2 week f/u    HPI Michelle Patton presents for   Fatigue: Discussed at December 26 visit.  Fatigue for the previous few months, similar to symptoms when she went into CHF in 2015 but not as bad.  No dyspnea at that time but sporadic chest pain few per week.  Denied dyspnea on exertion.[name]  tired with raking leaves but no chest pain or dyspnea prior.  Some increased work stress discussed at that time.  Retirement planned in March.  Was waking up once per night, history of OSA but unable to tolerate CPAP as she sleeps on her stomach and cause issues with the machine.  Feels rested initially in the morning then symptoms start within an hour to awakening.  She does have a history of chronic mesenteric ischemia followed by vascular surgery, on Plavix, and on NP thyroid by [facility] for continued hormonal treatment.  Aortic valve replacement in 2016 followed by cardiology Dr. Wyline Mood with echo EF 65 to 70% in 2023.  Nonspecific symptoms at last visit, EKG without acute findings but recommended she contact cardiology to discuss fatigue and intermittent chest symptoms.  Home monitoring for borderline blood pressure.  TSH, CBC, BNP and CMP obtained last visit. proBNP borderline elevated at 155, CBC, CMP reassuring.  TSH normal.  See labs below.   Office visit with cardiology noted from January 3, Dr. Wyline Mood.  Not thought to be consistent with ischemic chest pain.  Possible aortic pathology, MRI ordered.  Also thought to have some component of generalized fatigue and hypersomnolence from untreated OSA.  Referred to pulmonary.  Moderate OSA in 2022 and some weight gain since that time.  MR angiogram chest and ultrasound mesenteric arteries performed earlier today. Was advised that some plaque in stent.  Reports pending.   Since last visit. Felt  energized when she traveled to [place], may from being home, seeing family. No chest pains when she was there, no return of chest pain.  Some fatigue this weekend. Some persistent stress with work. Appt with pulmonary in March. Finds [place] to be depressing. `denies personal depression or hopelessness. Feels worn out. Feels like [place] is hopeless. Frustrations with politics.  Will be retiring in March. Majority of stress is work.  Other medical provider Rx chantix for smoking cessation - has not yet started - 2 months of Rx - needs me to refill. Has taken in past and tolerated. Aware of se's and risks.  Quit for 10 years prior after chantix prior.      04/19/2023    3:04 PM 12/18/2022    3:41 PM 07/17/2022    3:52 PM 03/27/2022   10:20 AM 01/16/2022    3:39 PM  Depression screen PHQ 2/9  Decreased Interest 0 0 0 0 0  Down, Depressed, Hopeless 0 0 0 0 0  PHQ - 2 Score 0 0 0 0 0  Altered sleeping 0 0 0 0 0  Tired, decreased energy 0 0 0 0 0  Change in appetite 0 0 0 0 0  Feeling bad or failure about yourself  0 0 0 0 0  Trouble concentrating 0 0 0 0 0  Moving slowly or fidgety/restless 0 0 0 0 0  Suicidal thoughts 0 0 0 0 0  PHQ-9 Score 0 0  0 0 0  Difficult doing work/chores  Not difficult at all         04/19/2023    3:05 PM 12/18/2022    3:41 PM  GAD 7 : Generalized Anxiety Score  Nervous, Anxious, on Edge 0 0  Control/stop worrying 0 0  Worry too much - different things 0 0  Trouble relaxing 0 0  Restless 0 0  Easily annoyed or irritable 0 0  Afraid - awful might happen 0 0  Total GAD 7 Score 0 0  Anxiety Difficulty  Not difficult at all      Results for orders placed or performed in visit on 04/19/23  Brain natriuretic peptide   Collection Time: 04/19/23  4:23 PM  Result Value Ref Range   Pro B Natriuretic peptide (BNP) 155.0 (H) 0.0 - 100.0 pg/mL  CBC with Differential/Platelet   Collection Time: 04/19/23  4:23 PM  Result Value Ref Range   WBC 8.6 4.0 - 10.5 K/uL    RBC 4.65 3.87 - 5.11 Mil/uL   Hemoglobin 13.0 12.0 - 15.0 g/dL   HCT 84.6 96.2 - 95.2 %   MCV 85.2 78.0 - 100.0 fl   MCHC 32.8 30.0 - 36.0 g/dL   RDW 84.1 32.4 - 40.1 %   Platelets 292.0 150.0 - 400.0 K/uL   Neutrophils Relative % 53.9 43.0 - 77.0 %   Lymphocytes Relative 33.4 12.0 - 46.0 %   Monocytes Relative 9.0 3.0 - 12.0 %   Eosinophils Relative 2.4 0.0 - 5.0 %   Basophils Relative 1.3 0.0 - 3.0 %   Neutro Abs 4.6 1.4 - 7.7 K/uL   Lymphs Abs 2.9 0.7 - 4.0 K/uL   Monocytes Absolute 0.8 0.1 - 1.0 K/uL   Eosinophils Absolute 0.2 0.0 - 0.7 K/uL   Basophils Absolute 0.1 0.0 - 0.1 K/uL  Comprehensive metabolic panel   Collection Time: 04/19/23  4:23 PM  Result Value Ref Range   Sodium 139 135 - 145 mEq/L   Potassium 4.3 3.5 - 5.1 mEq/L   Chloride 105 96 - 112 mEq/L   CO2 25 19 - 32 mEq/L   Glucose, Bld 88 70 - 99 mg/dL   BUN 14 6 - 23 mg/dL   Creatinine, Ser 0.27 0.40 - 1.20 mg/dL   Total Bilirubin 0.3 0.2 - 1.2 mg/dL   Alkaline Phosphatase 57 39 - 117 U/L   AST 19 0 - 37 U/L   ALT 15 0 - 35 U/L   Total Protein 7.2 6.0 - 8.3 g/dL   Albumin 4.2 3.5 - 5.2 g/dL   GFR 25.36 >64.40 mL/min   Calcium 9.2 8.4 - 10.5 mg/dL  TSH   Collection Time: 04/19/23  4:23 PM  Result Value Ref Range   TSH 1.23 0.35 - 5.50 uIU/mL      History Patient Active Problem List   Diagnosis Date Noted   Tobacco use disorder 08/28/2022   Leukocytosis 03/15/2022   Mixed hyperlipidemia 03/15/2022   Acquired hypothyroidism 03/15/2022   Hypoalbuminemia due to protein-calorie malnutrition (HCC) 03/15/2022   Body mass index (BMI) 34.0-34.9, adult 03/15/2022   Acute perforated appendicitis 03/14/2022   Allergy, drug 01/13/2022   Prolapsed internal hemorrhoids, grade 3 12/06/2021   Left lower quadrant abdominal pain 12/06/2021   Acute anaphylaxis 10/11/2021   Mesenteric ischemia (HCC) 07/12/2021   Chronic mesenteric ischemia (HCC) 02/22/2021   Elevated LFTs 10/05/2020   Class 2 severe obesity due  to excess calories with serious comorbidity and body mass  index (BMI) of 36.0 to 36.9 in adult [facility]) 08/09/2020   PAD (peripheral artery disease) (HCC) 08/09/2020   Aortic valve prosthesis present 08/09/2020   Chronic systolic congestive heart failure (HCC) 08/09/2020   OSA and COPD overlap syndrome (HCC) 08/09/2020   s/p right carpal tunnel release 03/02/20 04/06/2020   Closed displaced fracture of proximal phalanx of lesser toe of right foot 12/31/2019   Carpal tunnel syndrome of right wrist 12/05/2019   Nondisplaced fracture of fifth right metatarsal bone with routine healing 10/21/2019   Skin lesion 04/11/2019   Low back pain without sciatica 01/20/2019   Pain of upper abdomen 01/14/2019   Fatigue 12/09/2018   Breast cancer screening 12/09/2018   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 left wrist 10/23/2018   S/P right knee arthroscopy 01/10/18 01/18/2018   Chondromalacia, patella, right    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    History of colonic polyps 12/28/2016   Abdominal pain, epigastric 08/10/2016   Left sided abdominal pain 08/10/2016   Esophageal dysphagia 08/10/2016   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   Supravalvular aortic stenosis, congenital - s/p repair during childhood    Essential hypertension    GERD (gastroesophageal reflux disease)    Aortic stenosis, severe 07/20/2014   Aortic regurgitation 07/20/2014   Leg cramps 09/26/2012   Occlusion and stenosis of carotid artery without mention of cerebral infarction 07/10/2012   Peripheral vascular disease, unspecified (HCC) 06/12/2012   Carotid artery bruit 06/12/2012   Past Medical History:  Diagnosis Date   Aortic regurgitation 07/20/2014   Aortic stenosis, severe 07/20/2014   Arthritis    DVT (deep venous thrombosis) (HCC)    Family history of adverse reaction to anesthesia    Reports father deliurm in his 21's with CABG    GERD (gastroesophageal reflux disease)    History of pneumonia    Hx of repair of rotator cuff    R and L rotator cuff repair   Hyperlipidemia    Hypertension    Insomnia, unspecified    patient stated d/t menopause   Mesenteric ischemia (HCC)    Muscle weakness (generalized)    Peripheral artery disease (HCC)    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   Redo sternotomy for 21 mm Medtronic Freestyle porcine aortic root graft w/ reimplantation of left main and right coronary arteries   Sleep apnea    diagnosed multiple years ago at [facility] aortic stenosis, congenital - s/p repair during childhood    Past Surgical History:  Procedure Laterality Date   ABDOMINAL AORTAGRAM  06/24/2012   ABDOMINAL AORTAGRAM N/A 06/24/2012   Procedure: ABDOMINAL Ronny Flurry;  Surgeon: Chuck Hint, MD;  Location: [facility] CATH LAB;  Service: Cardiovascular;  Laterality: N/A;   AORTIC VALVE REPLACEMENT N/A 10/07/2014   Procedure: REDO AORTIC VALVE REPLACEMENT (AVR);  Surgeon: Purcell Nails, MD;  Location: [facility] OR;  Service: Open Heart Surgery;  Laterality: N/A;   ASCENDING AORTIC ROOT REPLACEMENT N/A 10/07/2014   Procedure: ASCENDING AORTIC ROOT REPLACEMENT;  Surgeon: Purcell Nails, MD;  Location: [facility] OR;  Service: Open Heart Surgery;  Laterality: N/A;   BIOPSY  09/27/2016   Procedure: BIOPSY;  Surgeon: Corbin Ade, MD;  Location: [facility] ENDO [facility];  Service: Endoscopy;;  colon   BIOPSY  10/18/2020   Procedure: BIOPSY;  Surgeon: Lanelle Bal, DO;  [facility]: [facility];  Service:  Endoscopy;;   BREAST REDUCTION SURGERY Bilateral 01/21/2018   Procedure: BREAST REDUCTION WITH LIPOSUCTION;  Surgeon: Louisa Second, MD;  Location: [facility];  Service: Plastics;  Laterality: Bilateral;   CARDIAC VALVE SURGERY  1968   CARPAL TUNNEL RELEASE Right 03/02/2020   Procedure: CARPAL TUNNEL RELEASE;  Surgeon: Vickki Hearing,  MD;  Location: [facility] ORS;  Service: Orthopedics;  Laterality: Right;   CATARACT EXTRACTION W/PHACO Left 05/30/2019   Procedure: CATARACT EXTRACTION PHACO AND INTRAOCULAR LENS PLACEMENT (IOC) (CDE: 4.94  );  Surgeon: Fabio Pierce, MD;  Location: [facility] ORS;  Service: Ophthalmology;  Laterality: Left;   CATARACT EXTRACTION W/PHACO Right 06/16/2019   Procedure: CATARACT EXTRACTION PHACO AND INTRAOCULAR LENS PLACEMENT (IOC);  Surgeon: Fabio Pierce, MD;  Location: [facility] ORS;  Service: Ophthalmology;  Laterality: Right;  CDE: 4.64   CERVICAL FUSION     CHOLECYSTECTOMY     COLONOSCOPY WITH PROPOFOL N/A 09/27/2016   Dr. Jena Gauss: Diverticulosis, several tubular adenomas removed ranging 4 to 7 mm in size, internal grade 1 hemorrhoids, terminal ileum normal, segmental biopsies negative for microscopic colitis.  Next colonoscopy June 2021   COLONOSCOPY WITH PROPOFOL N/A 05/06/2020   internal hemorrhoids, sigmoid diverticulosis. Surveillance colonoscopy due in 2027   COLONOSCOPY WITH PROPOFOL N/A 10/06/2022   Procedure: COLONOSCOPY WITH PROPOFOL;  Surgeon: Dolores Frame, MD;  [facility];  Service: Gastroenterology;  Laterality: N/A;  10:45AM;ASA 1   ESOPHAGOGASTRODUODENOSCOPY (EGD) WITH PROPOFOL N/A 09/27/2016   Dr. Jena Gauss: Small hiatal hernia, mild Schatzki ring status post disruption, LA grade a esophagitis   ESOPHAGOGASTRODUODENOSCOPY (EGD) WITH PROPOFOL N/A 10/18/2020   non-obstructing mild Schatzki ring, gastritis s/ biopsy. Negative H.pylori.   HEMOSTASIS CLIP PLACEMENT  10/06/2022   Procedure: HEMOSTASIS CLIP PLACEMENT;  Surgeon: Dolores Frame, MD;  [facility];  Service: Gastroenterology;;   ILIAC ARTERY STENT Left 12/2007   IR ANGIOGRAM EXTREMITY BILATERAL  07/12/2021   IR ANGIOGRAM VISCERAL SELECTIVE  10/29/2020   IR ANGIOGRAM VISCERAL SELECTIVE  07/12/2021   IR ANGIOGRAM VISCERAL SELECTIVE  03/21/2022   IR AORTAGRAM ABDOMINAL SERIALOGRAM  03/21/2022   IR  IVUS EACH ADDITIONAL NON CORONARY VESSEL  07/12/2021   IR IVUS EACH ADDITIONAL NON CORONARY VESSEL  03/21/2022   IR PTA NON CORO-LOWER EXTREM  03/21/2022   IR RADIOLOGIST EVAL & MGMT  10/26/2020   IR RADIOLOGIST EVAL & MGMT  11/10/2020   IR RADIOLOGIST EVAL & MGMT  02/09/2021   IR RADIOLOGIST EVAL & MGMT  06/16/2021   IR RADIOLOGIST EVAL & MGMT  08/15/2021   IR RADIOLOGIST EVAL & MGMT  10/18/2021   IR RADIOLOGIST EVAL & MGMT  01/11/2022   IR RADIOLOGIST EVAL & MGMT  03/02/2022   IR RADIOLOGIST EVAL & MGMT  05/11/2022   IR RADIOLOGIST EVAL & MGMT  08/18/2022   IR RADIOLOGIST EVAL & MGMT  10/18/2022   IR THROMBECT PRIM MECH INIT (INCLU) MOD SED  03/21/2022   IR THROMBECT SEC MECH MOD SED  07/12/2021   IR TRANSCATH PLC STENT 1ST ART NOT LE CV CAR VERT CAR  10/29/2020   IR TRANSCATH PLC STENT 1ST ART NOT LE CV CAR VERT CAR  07/12/2021   IR US GUIDE VASC ACCESS RIGHT  10/29/2020   IR US GUIDE VASC ACCESS RIGHT  07/12/2021   IR US GUIDE VASC ACCESS RIGHT  03/21/2022   KNEE ARTHROSCOPY WITH MEDIAL MENISECTOMY Right 01/10/2018   Procedure: RIGHT KNEE ARTHROSCOPY WITH PARTIAL MEDIAL MENISECTOMY;  Surgeon: Romeo Apple,  Fernande Boyden, MD;  Location: [facility];  Service: Orthopedics;  Laterality: Right;   LAPAROSCOPIC APPENDECTOMY N/A 08/03/2022   Procedure: APPENDECTOMY LAPAROSCOPIC;  Surgeon: Axel Filler, MD;  Location[facility] OR;  Service: General;  Laterality: N/A;   LEFT AND RIGHT HEART CATHETERIZATION WITH CORONARY ANGIOGRAM N/A 07/31/2014   Procedure: LEFT AND RIGHT HEART CATHETERIZATION WITH CORONARY ANGIOGRAM;  Surgeon: Kathleene Hazel, MD;  Location: [facility] CATH LAB;  Service: Cardiovascular;  Laterality: N/A;   MALONEY DILATION N/A 09/27/2016   Procedure: Elease Hashimoto DILATION;  Surgeon: Corbin Ade, MD;  [facility];  Service: Endoscopy;  Laterality: N/A;   POLYPECTOMY  09/27/2016   Procedure: POLYPECTOMY;  Surgeon: Corbin Ade, MD;  [facility];  Service: Endoscopy;;  colon    POLYPECTOMY  10/06/2022   Procedure: POLYPECTOMY;  Surgeon: Dolores Frame, MD;  [facility];  Service: Gastroenterology;;   ROTATOR CUFF REPAIR Bilateral    SUBMUCOSAL LIFTING INJECTION  10/06/2022   Procedure: SUBMUCOSAL LIFTING INJECTION;  Surgeon: Dolores Frame, MD;  [facility];  Service: Gastroenterology;;   TEE WITHOUT CARDIOVERSION N/A 07/07/2014   Procedure: TRANSESOPHAGEAL ECHOCARDIOGRAM (TEE);  Surgeon: Antoine Poche, MD;  [facility];  Service: Cardiology;  Laterality: N/A;   TEE WITHOUT CARDIOVERSION N/A 07/20/2014   Procedure: TRANSESOPHAGEAL ECHOCARDIOGRAM (TEE) WITH PROPOFOL;  Surgeon: Antoine Poche, MD;  Location: [facility];  Service: Endoscopy;  Laterality: N/A;   TEE WITHOUT CARDIOVERSION N/A 10/07/2014   Procedure: TRANSESOPHAGEAL ECHOCARDIOGRAM (TEE);  Surgeon: Purcell Nails, MD;  Location: [facility] OR;  Service: Open Heart Surgery;  Laterality: N/A;   Allergies  Allergen Reactions   Iodinated Contrast Media Anaphylaxis, Shortness Of Breath, Itching, Swelling and Other (See Comments)    Patient was given Omni 350 and suffered from itching, chest pain and shortness of breath. Patient was taken to ED after onset of reaction. 10/11/2021  MD zackowski noted pt had tongue and throat swelling, had to give pt epinephrine    Sulfa Antibiotics Nausea And Vomiting   Prior to Admission medications   Medication Sig Start Date End Date Taking? Authorizing Provider  acetaminophen (TYLENOL) 325 MG tablet Take 650 mg by mouth every 6 (six) hours as needed for mild pain, moderate pain, fever or headache.   Yes [provider]  aspirin EC 81 MG tablet Take 1 tablet (81 mg total) by mouth daily. Swallow whole. 12/16/19  Yes BranchDorothe Pea, MD  Cholecalciferol (VITAMIN D-3 PO) Take 1 capsule by mouth in the morning and at bedtime.   Yes [provider]  clopidogrel (PLAVIX) 75 MG tablet Take 1 tablet (75 mg total)  by mouth daily. 02/09/23  Yes Hoyt Koch, PA  estradiol (ESTRACE) 2 MG tablet Take 1 tablet (2 mg total) by mouth daily. 01/19/21  Yes Shade Flood, MD  ezetimibe (ZETIA) 10 MG tablet TAKE 1 TABLET BY MOUTH DAILY 12/05/22  Yes Branch, Dorothe Pea, MD  Flaxseed, Linseed, (FLAX SEED OIL PO) Take 1 capsule by mouth in the morning.   Yes [provider]  fluticasone (FLONASE) 50 MCG/ACT nasal spray Place 1 spray into both nostrils 2 (two) times daily. 12/07/22  Yes Particia Nearing, PA-C  hydrocortisone (ANUSOL-HC) 2.5 % rectal cream Place 1 Application rectally 2 (two) times daily. As needed for rectal bleeding. Patient taking differently: Place 1 Application rectally 2 (two) times daily as needed for hemorrhoids (rectal bleeding). 12/06/21  Yes Gelene Mink,  NP  losartan (COZAAR) 25 MG tablet TAKE 1 TABLET BY MOUTH DAILY 02/26/23  Yes Shade Flood, MD  metoprolol tartrate (LOPRESSOR) 25 MG tablet TAKE 1 TABLET BY MOUTH TWICE  DAILY 01/08/23  Yes Branch, Dorothe Pea, MD  Multiple Minerals (CALCIUM-MAGNESIUM-ZINC) TABS Take 1 tablet by mouth in the morning.   Yes [provider]  NP THYROID 120 MG tablet Take 60 mg by mouth daily before breakfast. 01/11/22  Yes [provider]  progesterone (PROMETRIUM) 200 MG capsule Take 2 capsules (400 mg total) by mouth daily. Patient taking differently: Take 400 mg by mouth at bedtime. 01/19/21 10/03/23 Yes Shade Flood, MD  RABEprazole (ACIPHEX) 20 MG tablet Take 1 tablet (20 mg total) by mouth 2 (two) times daily before a meal. 03/12/23  Yes Gelene Mink, NP  rosuvastatin (CRESTOR) 40 MG tablet TAKE 1 TABLET BY MOUTH DAILY 10/09/22  Yes Branch, Dorothe Pea, MD  triamcinolone ointment (KENALOG) 0.1 % Apply 1 Application topically 2 (two) times daily as needed. Patient taking differently: Apply 1 Application topically 2 (two) times daily as needed (irritation). 11/25/21  Yes Alfonse Spruce, MD  varenicline  (CHANTIX CONTINUING MONTH PAK) 1 MG tablet Take 1 tablet (1 mg total) by mouth 2 (two) times daily. Start this after finishing initial low dose week 05/10/23  Yes Marguerita Merles, Reuel Boom, MD  varenicline (CHANTIX) 0.5 MG tablet Take 1 tablet (0.5 mg total) by mouth daily for 3 days, THEN 1 tablet (0.5 mg total) 2 (two) times daily for 4 days. 05/10/23 05/17/23 Yes Dolores Frame, MD   Social History   Socioeconomic History   Marital status: Married    Spouse name: Raschie   Number of children: 0   Years of education: some coll   Highest education level: Some college, no degree  Occupational History   Occupation: Financial planner: [employer]    Comment: [employer]  Tobacco Use   Smoking status: Every Day    Current packs/day: 0.00    Average packs/day: 0.5 packs/day for 40.0 years (20.0 ttl pk-yrs)    Types: Cigarettes    Start date: 05/1981    Last attempt to quit: 05/2021    Years since quitting: 1.9   Smokeless tobacco: Never  Vaping Use   Vaping status: Never Used  Substance and Sexual Activity   Alcohol use: Yes    Alcohol/week: 2.0 standard drinks of alcohol    Types: 2 Cans of beer per week   Drug use: No   Sexual activity: Yes    Birth control/protection: None  Other Topics Concern   Not on file  Social History Narrative   Patient is married   for 5 years . Patient works at [employer].. 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   Social Drivers of Corporate investment banker Strain: Low Risk  (04/16/2023)   Overall Financial Resource Strain (CARDIA)    Difficulty of Paying Living Expenses: Not hard at all  Food Insecurity: No Food Insecurity (04/16/2023)   Hunger Vital Sign    Worried About Running Out of Food in the Last Year: Never true    Ran Out of Food in the Last Year: Never true  Transportation Needs: No Transportation Needs (04/16/2023)   PRAPARE - Administrator, Civil Service (Medical): No     Lack of Transportation (Non-Medical): No  Physical Activity: Insufficiently Active (04/16/2023)   Exercise Vital Sign    Days  of Exercise per Week: 2 days    Minutes of Exercise per Session: 30 min  Stress: Stress Concern Present (04/16/2023)   [employer] of Occupational Health - Occupational Stress Questionnaire    Feeling of Stress : To some extent  Social Connections: Socially Isolated (04/16/2023)   Social Connection and Isolation Panel [NHANES]    Frequency of Communication with Friends and Family: Once a week    Frequency of Social Gatherings with Friends and Family: Once a week    Attends Religious Services: Never    Database administrator or Organizations: No    Attends Engineer, structural: Not on file    Marital Status: Married  Catering manager Violence: Not At Risk (03/22/2022)   Humiliation, Afraid, Rape, and Kick questionnaire    Fear of Current or Ex-Partner: No    Emotionally Abused: No    Physically Abused: No    Sexually Abused: No    Review of Systems Per HPI.   Objective:   Vitals:   05/14/23 1253  BP: 138/76  Pulse: 80  Temp: 98.1 F (36.7 C)  TempSrc: Temporal  SpO2: 97%     Physical Exam Vitals reviewed.  Constitutional:      Appearance: Normal appearance. She is well-developed.  HENT:     Head: Normocephalic and atraumatic.  Eyes:     Conjunctiva/sclera: Conjunctivae normal.     Pupils: Pupils are equal, round, and reactive to light.  Neck:     Vascular: No carotid bruit.  Cardiovascular:     Rate and Rhythm: Normal rate and regular rhythm.     Heart sounds: Normal heart sounds.  Pulmonary:     Effort: Pulmonary effort is normal.     Breath sounds: Normal breath sounds.  Abdominal:     Palpations: Abdomen is soft. There is no pulsatile mass.     Tenderness: There is no abdominal tenderness.  Musculoskeletal:     Right lower leg: No edema.     Left lower leg: No edema.  Skin:    General: Skin is warm and dry.   Neurological:     Mental Status: She is alert and oriented to person, place, and time.  Psychiatric:        Mood and Affect: Mood normal.        Behavior: Behavior normal.     Comments: Interacting appropriately and does not appear to be responding to internal stimuli.        Assessment & Plan:  Michelle Patton is a 68 y.o. female . Fatigue, unspecified type  Chronic mesenteric ischemia (HCC)  Cigarette nicotine dependence without complication  Situational stress  Previous labs are reassuring from fatigue standpoint.  MRA completed but not yet read at time of visit.  Has seen cardiology, did not think her chest symptoms were cardiac.  BNP only borderline elevated.  Improvement in symptoms when she was out of town, away from work, suspect there may be a component of situational stress or anxiety contributing to her fatigue.  Handout given.  Option of therapy/counseling with numbers provided.  She will be retiring in the next 2 months, and can monitor to see if symptoms improved with retirement, no new meds for now.  Follow-up with vascular for chronic mesenteric ischemia and recent study review.  Tobacco abuse, plans on starting Chantix, has initial prescription and I am okay with sending in refills with 67-month follow-up.   No orders of the defined types were placed in this  encounter.  Patient Instructions  MRA results are not yet available, but cardiology should be contacting you once those are available.Labs last visit were overall reassuring.  See info on managing stress below. It is possible that the stressors are contributing to fatigue, especially since you felt better away from work and when traveling up [place].  I am optimistic that your symptoms were improved after retiring in March.  I have listed some therapists below he would like as that can be helpful in managing these symptoms as well.  No new medications for now.  Send me a message when refill of chantix needed. I  can send that in without a visit since we discussed it today.   Here are a few options for counseling:  [facility]:  [phone]  [facility]:  [phone]  [facility] [phone]    Stress, Adult Stress is a normal reaction to life events. Stress is what you feel when life demands more than you are used to, or more than you think you can handle. Some stress can be useful, such as studying for a test or meeting a deadline at work. Stress that occurs too often or for too long can cause problems. Long-lasting stress is called chronic stress. Chronic stress can affect your emotional health and interfere with relationships and normal daily activities. Too much stress can weaken your body's defense system (immune system) and increase your risk for physical illness. If you already have a medical problem, stress can make it worse. What are the causes? All sorts of life events can cause stress. An event that causes stress for one person may not be stressful for someone else. Major life events, whether positive or negative, commonly cause stress. Examples include: Losing a job or starting a new job. Losing a loved one. Moving to a new town or home. Getting married or divorced. Having a baby. Getting injured or sick. Less obvious life events can also cause stress, especially if they occur day after day or in combination with each other. Examples include: Working long hours. Driving in traffic. Caring for children. Being in debt. Being in a difficult relationship. What are the signs or symptoms? Stress can cause emotional and physical symptoms and can lead to unhealthy behaviors. These include the following: Emotional symptoms Anxiety. This is feeling worried, afraid, on edge, overwhelmed, or out of control. Anger, including irritation or impatience. Depression. This is feeling sad, down, helpless, or guilty. Trouble  focusing, remembering, or making decisions. Physical symptoms Aches and pains. These may affect your head, neck, back, stomach, or other areas of your body. Tight muscles or a clenched jaw. Low energy. Trouble sleeping. Unhealthy behaviors Eating to feel better (overeating) or skipping meals. Working too much or putting off tasks. Smoking, drinking alcohol, or using drugs to feel better. How is this diagnosed? A stress disorder is diagnosed through an assessment by your health care provider. A stress disorder may be diagnosed based on: Your symptoms and any stressful life events. Your medical history. Tests to rule out other causes of your symptoms. Depending on your condition, your health care provider may refer you to a specialist for further evaluation. How is this treated?  Stress management techniques are the recommended treatment for stress. Medicine is not typically recommended for treating stress. Techniques to reduce your reaction to stressful life events include: Identifying stress. Monitor yourself for symptoms of stress and notice what causes stress for you. These skills may help you to avoid  or prepare for stressful events. Managing time. Set your priorities, keep a calendar of events, and learn to say no. These actions can help you avoid taking on too much. Techniques for dealing with stress include: Rethinking the problem. Try to think realistically about stressful events rather than ignoring them or overreacting. Try to find the positives in a stressful situation rather than focusing on the negatives. Exercise. Physical exercise can release both physical and emotional tension. The key is to find a form of exercise that you enjoy and do it regularly. Relaxation techniques. These relax the body and mind. Find one or more that you enjoy and use the techniques regularly. Examples include: Meditation, deep breathing, or progressive relaxation techniques. Yoga or tai  chi. Biofeedback, mindfulness techniques, or journaling. Listening to music, being in nature, or taking part in other hobbies. Practicing a healthy lifestyle. Eat a balanced diet, drink plenty of water, limit or avoid caffeine, and get plenty of sleep. Having a strong support network. Spend time with family, friends, or other people you enjoy being around. Express your feelings and talk things over with someone you trust. Counseling or talk therapy with a mental health provider may help if you are having trouble managing stress by yourself. Follow these instructions at home: Lifestyle  Avoid drugs. Do not use any products that contain nicotine or tobacco. These products include cigarettes, chewing tobacco, and vaping devices, such as e-cigarettes. If you need help quitting, ask your health care provider. If you drink alcohol: Limit how much you have to: 0-1 drink a day for women who are not pregnant. 0-2 drinks a day for men. Know how much alcohol is in a drink. In the [place] one drink equals one 12 oz bottle of beer (355 mL), one 5 oz glass of wine (148 mL), or one 1 oz glass of hard liquor (44 mL). Do not use alcohol or drugs to relax. Eat a balanced diet that includes fresh fruits and vegetables, whole grains, lean meats, fish, eggs, beans, and low-fat dairy. Avoid processed foods and foods high in added fat, sugar, and salt. Exercise at least 30 minutes on 5 or more days each week. Get 7-8 hours of sleep each night. General instructions  Practice stress management techniques as told by your health care provider. Drink enough fluid to keep your urine pale yellow. Take over-the-counter and prescription medicines only as told by your health care provider. Keep all follow-up visits. This is important. Contact a health care provider if: Your symptoms get worse. You have new symptoms. You feel overwhelmed by your problems and can no longer manage them by yourself. Get help right away  if: You have thoughts of hurting yourself or others. Get help right awayif you feel like you may hurt yourself or others, or have thoughts about taking your own life. Go to your nearest emergency room or: Call 911. Call the National Suicide Prevention Lifeline at 4695589852 or 988. This is open 24 hours a day. Text the Crisis Text Line at (947)791-3421. Summary Stress is a normal reaction to life events. It can cause problems if it happens too often or for too long. Practicing stress management techniques is the best way to treat stress. Counseling or talk therapy with a mental health provider may help if you are having trouble managing stress by yourself. This information is not intended to replace advice given to you by your health care provider. Make sure you discuss any questions you have with your health care provider.  Document Revised: 11/18/2020 Document Reviewed: 11/18/2020 Elsevier Patient Education  2024 [employer]     Signed,   Meredith Staggers, MD [facility], [facility] 05/14/23 1:43 PM

## 2023-05-14 NOTE — Patient Instructions (Addendum)
MRA results are not yet available, but cardiology should be contacting you once those are available.Labs last visit were overall reassuring.  See info on managing stress below. It is possible that the stressors are contributing to fatigue, especially since you felt better away from work and when traveling up [place].  I am optimistic that your symptoms were improved after retiring in March.  I have listed some therapists below he would like as that can be helpful in managing these symptoms as well.  No new medications for now.  Send me a message when refill of chantix needed. I can send that in without a visit since we discussed it today.   Here are a few options for counseling:  [facility]:  [phone]  [facility]:  [phone]  [facility] [phone]    Stress, Adult Stress is a normal reaction to life events. Stress is what you feel when life demands more than you are used to, or more than you think you can handle. Some stress can be useful, such as studying for a test or meeting a deadline at work. Stress that occurs too often or for too long can cause problems. Long-lasting stress is called chronic stress. Chronic stress can affect your emotional health and interfere with relationships and normal daily activities. Too much stress can weaken your body's defense system (immune system) and increase your risk for physical illness. If you already have a medical problem, stress can make it worse. What are the causes? All sorts of life events can cause stress. An event that causes stress for one person may not be stressful for someone else. Major life events, whether positive or negative, commonly cause stress. Examples include: Losing a job or starting a new job. Losing a loved one. Moving to a new town or home. Getting married or divorced. Having a baby. Getting injured or sick. Less obvious life events can also cause  stress, especially if they occur day after day or in combination with each other. Examples include: Working long hours. Driving in traffic. Caring for children. Being in debt. Being in a difficult relationship. What are the signs or symptoms? Stress can cause emotional and physical symptoms and can lead to unhealthy behaviors. These include the following: Emotional symptoms Anxiety. This is feeling worried, afraid, on edge, overwhelmed, or out of control. Anger, including irritation or impatience. Depression. This is feeling sad, down, helpless, or guilty. Trouble focusing, remembering, or making decisions. Physical symptoms Aches and pains. These may affect your head, neck, back, stomach, or other areas of your body. Tight muscles or a clenched jaw. Low energy. Trouble sleeping. Unhealthy behaviors Eating to feel better (overeating) or skipping meals. Working too much or putting off tasks. Smoking, drinking alcohol, or using drugs to feel better. How is this diagnosed? A stress disorder is diagnosed through an assessment by your health care provider. A stress disorder may be diagnosed based on: Your symptoms and any stressful life events. Your medical history. Tests to rule out other causes of your symptoms. Depending on your condition, your health care provider may refer you to a specialist for further evaluation. How is this treated?  Stress management techniques are the recommended treatment for stress. Medicine is not typically recommended for treating stress. Techniques to reduce your reaction to stressful life events include: Identifying stress. Monitor yourself for symptoms of stress and notice what causes stress for you. These skills may help you to avoid or prepare for stressful events. Managing  time. Set your priorities, keep a calendar of events, and learn to say no. These actions can help you avoid taking on too much. Techniques for dealing with stress  include: Rethinking the problem. Try to think realistically about stressful events rather than ignoring them or overreacting. Try to find the positives in a stressful situation rather than focusing on the negatives. Exercise. Physical exercise can release both physical and emotional tension. The key is to find a form of exercise that you enjoy and do it regularly. Relaxation techniques. These relax the body and mind. Find one or more that you enjoy and use the techniques regularly. Examples include: Meditation, deep breathing, or progressive relaxation techniques. Yoga or tai chi. Biofeedback, mindfulness techniques, or journaling. Listening to music, being in nature, or taking part in other hobbies. Practicing a healthy lifestyle. Eat a balanced diet, drink plenty of water, limit or avoid caffeine, and get plenty of sleep. Having a strong support network. Spend time with family, friends, or other people you enjoy being around. Express your feelings and talk things over with someone you trust. Counseling or talk therapy with a mental health provider may help if you are having trouble managing stress by yourself. Follow these instructions at home: Lifestyle  Avoid drugs. Do not use any products that contain nicotine or tobacco. These products include cigarettes, chewing tobacco, and vaping devices, such as e-cigarettes. If you need help quitting, ask your health care provider. If you drink alcohol: Limit how much you have to: 0-1 drink a day for women who are not pregnant. 0-2 drinks a day for men. Know how much alcohol is in a drink. In the [place] one drink equals one 12 oz bottle of beer (355 mL), one 5 oz glass of wine (148 mL), or one 1 oz glass of hard liquor (44 mL). Do not use alcohol or drugs to relax. Eat a balanced diet that includes fresh fruits and vegetables, whole grains, lean meats, fish, eggs, beans, and low-fat dairy. Avoid processed foods and foods high in added fat, sugar, and  salt. Exercise at least 30 minutes on 5 or more days each week. Get 7-8 hours of sleep each night. General instructions  Practice stress management techniques as told by your health care provider. Drink enough fluid to keep your urine pale yellow. Take over-the-counter and prescription medicines only as told by your health care provider. Keep all follow-up visits. This is important. Contact a health care provider if: Your symptoms get worse. You have new symptoms. You feel overwhelmed by your problems and can no longer manage them by yourself. Get help right away if: You have thoughts of hurting yourself or others. Get help right awayif you feel like you may hurt yourself or others, or have thoughts about taking your own life. Go to your nearest emergency room or: Call 911. Call the National Suicide Prevention Lifeline at 431-167-1880 or 988. This is open 24 hours a day. Text the Crisis Text Line at (743) 330-2796. Summary Stress is a normal reaction to life events. It can cause problems if it happens too often or for too long. Practicing stress management techniques is the best way to treat stress. Counseling or talk therapy with a mental health provider may help if you are having trouble managing stress by yourself. This information is not intended to replace advice given to you by your health care provider. Make sure you discuss any questions you have with your health care provider. Document Revised: 11/18/2020 Document Reviewed: 11/18/2020  Elsevier Patient Education  2024 [employer].

## 2023-05-18 NOTE — Progress Notes (Signed)
Reason for follow up: Virtual telephone visit approximately 1 year status post mesenteric angiogram, thrombectomy, and drug coated balloon angioplasty of SMA   History of present illness: HPI from last clinic visit 10/18/22 Michelle Patton, 68 year old female, has a medical history significant for HTN, anxiety, DVT, PAD (s/p left external iliac stent 2014) and congenital supraaortic valvular stenosis. As a child she underwent aortic repair and as an adult she underwent root/valve replacement (2016).  In March 2022 she developed abdominal pain that gradually worsened and was most acute after eating. A thorough work up by her PCP and GI teams revealed severe, 3 cm proximal SMA stenosis and focal stenosis of the distal celiac trunk. 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 She was referred to Interventional Radiology for treatment options for chronic mesenteric ischemia and I first met with her October 26, 2020.  She was found to be a candidate for possible stent placement and was scheduled for a mesenteric angiogram with possible intervention. On October 29, 2020  she underwent aortic and superior mesenteric arteriograms with stent placement to the superior mesenteric artery. She tolerated the procedure well and was discharged home the same day. She was started on Aspirin and Plavix post-procedure.    The stent improved her pain dramatically and she did well for many months post-procedure.  Follow up imaging February 2023 unfortunately showed stent occlusion and on 07/12/21 she underwent successful SMA stent recanalization with laser atherectomy and placement of a new SMA stent.  I also performed a diagnostic bilateral lower extremity angiogram for evaluation of foot and calf pain/cramping. This study revealed a mild stenosis just proximal to the indwelling left external iliac stent, but otherwise widely patent inflow, outflow, and runoff vessels.    Multiple follow up visits after this second procedure found  the patient to be doing quite well but at follow up visits that Fall she had complaints of non-specific, vague abdominal pain concerning for re-stenosis of the SMA stent. She unfortunately suffered a true anaphylactic reaction from the iodinated contrast after her 10/11/21 CT. She went into shock which required hospitalization. She is no longer able to receive contrast for CT studies so she underwent stent evaluation with ultrasound imaging with findings suggestive of stent occlusion. During her 03/02/22 visit we discussed a repeat angiogram (with CO2) + IVUS versus watchful waiting. We made plans to follow up in one month but she developed worsening abdominal pain and was sent to the ED by GI and was found to have perforated appendicitis. While inpatient during that hospitalization I performed a repeat mesenteric angiogram, thrombectomy and drug-coated balloon angioplasty of the SMA 03/21/22. She was discharged home the next day and has reported minimal abdominal pain at all of our visits since then. She did have complaints about RLQ pain related to her appendix and she received an appendectomy 08/03/22. A repeat mesenteric duplex 08/15/22 showed increased velocities but patency of the SMA stents. She remains compliant with aspirin and plavix.    She presents today via virtual telephone visit for follow up.  She has established care with Dr. Levon Hedger, and underwent colonoscopy several weeks ago with 3 sampled polyps which are benign.  She started Chantix which has been successful, she is no longer smoking.  She continues to have sporadic right lower quadrant pain which is occasional, sometimes after eating, but not always.  She has gained weight.  No food fear.  Remains compliant with aspirin and plavix.  No major bleeding issues.  At her last follow up visit we discussed the results of the mesenteric duplex study and made plans to follow up with a repeat study in 6 months. This was completed 05/14/23 and she  presents today for follow up via virtual tele-health visit.  She has no change in her symptoms.  Occasional twinge in her left lower abdomen.  No food fear or post-prandial abdominal pain. She is able to eat what she wants.  She has some increased bruising with aspirin and plavix which she remains compliant.    She continues to smoke.  Hoping to start chantix soon and get her husband to stop as well.  He continues to have rest pain at night.  She is using a CBD oil which helps tremendously.  No claudication symptoms.  Past Medical History:  Diagnosis Date   Aortic regurgitation 07/20/2014   Aortic stenosis, severe 07/20/2014   Arthritis    DVT (deep venous thrombosis) (HCC)    Family history of adverse reaction to anesthesia    Reports father deliurm in his 19's with CABG   GERD (gastroesophageal reflux disease)    History of pneumonia    Hx of repair of rotator cuff    R and L rotator cuff repair   Hyperlipidemia    Hypertension    Insomnia, unspecified    patient stated d/t menopause   Mesenteric ischemia (HCC)    Muscle weakness (generalized)    Peripheral artery disease (HCC)    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   Redo sternotomy for 21 mm Medtronic Freestyle porcine aortic root graft w/ reimplantation of left main and right coronary arteries   Sleep apnea    diagnosed multiple years ago at [facility] aortic stenosis, congenital - s/p repair during childhood     Past Surgical History:  Procedure Laterality Date   ABDOMINAL AORTAGRAM  06/24/2012   ABDOMINAL AORTAGRAM N/A 06/24/2012   Procedure: ABDOMINAL Ronny Flurry;  Surgeon: Chuck Hint, MD;  Location: [facility] CATH LAB;  Service: Cardiovascular;  Laterality: N/A;   AORTIC VALVE REPLACEMENT N/A 10/07/2014   Procedure: REDO AORTIC VALVE REPLACEMENT (AVR);  Surgeon: Purcell Nails, MD;  Location: [facility] OR;  Service: Open Heart Surgery;  Laterality: N/A;    ASCENDING AORTIC ROOT REPLACEMENT N/A 10/07/2014   Procedure: ASCENDING AORTIC ROOT REPLACEMENT;  Surgeon: Purcell Nails, MD;  Location: [facility] OR;  Service: Open Heart Surgery;  Laterality: N/A;   BIOPSY  09/27/2016   Procedure: BIOPSY;  Surgeon: Corbin Ade, MD;  Location: [facility] END[facility];  Service: Endoscopy;;  colon   BIOPSY  10/18/2020   Procedure: BIOPSY;  Surgeon: Lanelle Bal, DO;  Location: [facility];  Service: Endoscopy;;   BREAST REDUCTION SURGERY Bilateral 01/21/2018   Procedure: BREAST REDUCTION WITH LIPOSUCTION;  Surgeon: Louisa Second, MD;  Location: [facility];  Service: Plastics;  Laterality: Bilateral;   CARDIAC VALVE SURGERY  1968   CARPAL TUNNEL RELEASE Right 03/02/2020   Procedure: CARPAL TUNNEL RELEASE;  Surgeon: Vickki Hearing, MD;  Location: [facility] ORS;  Service: Orthopedics;  Laterality: Right;   CATARACT EXTRACTION W/PHACO Left 05/30/2019   Procedure: CATARACT EXTRACTION PHACO AND INTRAOCULAR LENS PLACEMENT (IOC) (CDE: 4.94  );  Surgeon: Fabio Pierce, MD;  Location: [facility] ORS;  Service: Ophthalmology;  Laterality: Left;   CATARACT EXTRACTION W/PHACO Right 06/16/2019   Procedure: CATARACT EXTRACTION PHACO AND INTRAOCULAR LENS PLACEMENT (IOC);  Surgeon: Fabio Pierce, MD;  Location: [facility] ORS;  Service: Ophthalmology;  Laterality: Right;  CDE: 4.64   CERVICAL FUSION     CHOLECYSTECTOMY     COLONOSCOPY WITH PROPOFOL N/A 09/27/2016   Dr. Jena Gauss: Diverticulosis, several tubular adenomas removed ranging 4 to 7 mm in size, internal grade 1 hemorrhoids, terminal ileum normal, segmental biopsies negative for microscopic colitis.  Next colonoscopy June 2021   COLONOSCOPY WITH PROPOFOL N/A 05/06/2020   internal hemorrhoids, sigmoid diverticulosis. Surveillance colonoscopy due in 2027   COLONOSCOPY WITH PROPOFOL N/A 10/06/2022   Procedure: COLONOSCOPY WITH PROPOFOL;  Surgeon: Dolores Frame, MD;  [facility];  Service:  Gastroenterology;  Laterality: N/A;  10:45AM;ASA 1   ESOPHAGOGASTRODUODENOSCOPY (EGD) WITH PROPOFOL N/A 09/27/2016   Dr. Jena Gauss: Small hiatal hernia, mild Schatzki ring status post disruption, LA grade a esophagitis   ESOPHAGOGASTRODUODENOSCOPY (EGD) WITH PROPOFOL N/A 10/18/2020   non-obstructing mild Schatzki ring, gastritis s/ biopsy. Negative H.pylori.   HEMOSTASIS CLIP PLACEMENT  10/06/2022   Procedure: HEMOSTASIS CLIP PLACEMENT;  Surgeon: Dolores Frame, MD;  [facility];  Service: Gastroenterology;;   ILIAC ARTERY STENT Left 12/2007   IR ANGIOGRAM EXTREMITY BILATERAL  07/12/2021   IR ANGIOGRAM VISCERAL SELECTIVE  10/29/2020   IR ANGIOGRAM VISCERAL SELECTIVE  07/12/2021   IR ANGIOGRAM VISCERAL SELECTIVE  03/21/2022   IR AORTAGRAM ABDOMINAL SERIALOGRAM  03/21/2022   IR IVUS EACH ADDITIONAL NON CORONARY VESSEL  07/12/2021   IR IVUS EACH ADDITIONAL NON CORONARY VESSEL  03/21/2022   IR PTA NON CORO-LOWER EXTREM  03/21/2022   IR RADIOLOGIST EVAL & MGMT  10/26/2020   IR RADIOLOGIST EVAL & MGMT  11/10/2020   IR RADIOLOGIST EVAL & MGMT  02/09/2021   IR RADIOLOGIST EVAL & MGMT  06/16/2021   IR RADIOLOGIST EVAL & MGMT  08/15/2021   IR RADIOLOGIST EVAL & MGMT  10/18/2021   IR RADIOLOGIST EVAL & MGMT  01/11/2022   IR RADIOLOGIST EVAL & MGMT  03/02/2022   IR RADIOLOGIST EVAL & MGMT  05/11/2022   IR RADIOLOGIST EVAL & MGMT  08/18/2022   IR RADIOLOGIST EVAL & MGMT  10/18/2022   IR THROMBECT PRIM MECH INIT (INCLU) MOD SED  03/21/2022   IR THROMBECT SEC MECH MOD SED  07/12/2021   IR TRANSCATH PLC STENT 1ST ART NOT LE CV CAR VERT CAR  10/29/2020   IR TRANSCATH PLC STENT 1ST ART NOT LE CV CAR VERT CAR  07/12/2021   IR US GUIDE VASC ACCESS RIGHT  10/29/2020   IR US GUIDE VASC ACCESS RIGHT  07/12/2021   IR US GUIDE VASC ACCESS RIGHT  03/21/2022   KNEE ARTHROSCOPY WITH MEDIAL MENISECTOMY Right 01/10/2018   Procedure: RIGHT KNEE ARTHROSCOPY WITH PARTIAL MEDIAL MENISECTOMY;  Surgeon: Vickki Hearing, MD;  Location: [facility];  Service: Orthopedics;  Laterality: Right;   LAPAROSCOPIC APPENDECTOMY N/A 08/03/2022   Procedure: APPENDECTOMY LAPAROSCOPIC;  Surgeon: Axel Filler, MD;  Location[facility] OR;  Service: General;  Laterality: N/A;   LEFT AND RIGHT HEART CATHETERIZATION WITH CORONARY ANGIOGRAM N/A 07/31/2014   Procedure: LEFT AND RIGHT HEART CATHETERIZATION WITH CORONARY ANGIOGRAM;  Surgeon: Kathleene Hazel, MD;  Location: [facility] CATH LAB;  Service: Cardiovascular;  Laterality: N/A;   MALONEY DILATION N/A 09/27/2016   Procedure: Elease Hashimoto DILATION;  Surgeon: Corbin Ade, MD;  [facility];  Service: Endoscopy;  Laterality: N/A;   POLYPECTOMY  09/27/2016   Procedure: POLYPECTOMY;  Surgeon: Corbin Ade, MD;  [facility];  Service:  Endoscopy;;  colon   POLYPECTOMY  10/06/2022   Procedure: POLYPECTOMY;  Surgeon: Dolores Frame, MD;  [facility];  Service: Gastroenterology;;   ROTATOR CUFF REPAIR Bilateral    SUBMUCOSAL LIFTING INJECTION  10/06/2022   Procedure: SUBMUCOSAL LIFTING INJECTION;  Surgeon: Dolores Frame, MD;  [facility];  Service: Gastroenterology;;   TEE WITHOUT CARDIOVERSION N/A 07/07/2014   Procedure: TRANSESOPHAGEAL ECHOCARDIOGRAM (TEE);  Surgeon: Antoine Poche, MD;  [facility];  Service: Cardiology;  Laterality: N/A;   TEE WITHOUT CARDIOVERSION N/A 07/20/2014   Procedure: TRANSESOPHAGEAL ECHOCARDIOGRAM (TEE) WITH PROPOFOL;  Surgeon: Antoine Poche, MD;  Location: [facility];  Service: Endoscopy;  Laterality: N/A;   TEE WITHOUT CARDIOVERSION N/A 10/07/2014   Procedure: TRANSESOPHAGEAL ECHOCARDIOGRAM (TEE);  Surgeon: Purcell Nails, MD;  Location: [facility] OR;  Service: Open Heart Surgery;  Laterality: N/A;    Allergies: Iodinated contrast media and Sulfa antibiotics  Medications: Prior to Admission medications   Medication Sig Start Date End Date Taking? Authorizing Provider   acetaminophen (TYLENOL) 325 MG tablet Take 650 mg by mouth every 6 (six) hours as needed for mild pain, moderate pain, fever or headache.    [provider]  aspirin EC 81 MG tablet Take 1 tablet (81 mg total) by mouth daily. Swallow whole. 12/16/19   Antoine Poche, MD  Cholecalciferol (VITAMIN D-3 PO) Take 1 capsule by mouth in the morning and at bedtime.    [provider]  clopidogrel (PLAVIX) 75 MG tablet Take 1 tablet (75 mg total) by mouth daily. 02/09/23   Hoyt Koch, PA  estradiol (ESTRACE) 2 MG tablet Take 1 tablet (2 mg total) by mouth daily. 01/19/21   Shade Flood, MD  ezetimibe (ZETIA) 10 MG tablet TAKE 1 TABLET BY MOUTH DAILY 12/05/22   Antoine Poche, MD  Flaxseed, Linseed, (FLAX SEED OIL PO) Take 1 capsule by mouth in the morning.    [provider]  fluticasone (FLONASE) 50 MCG/ACT nasal spray Place 1 spray into both nostrils 2 (two) times daily. 12/07/22   Particia Nearing, PA-C  hydrocortisone (ANUSOL-HC) 2.5 % rectal cream Place 1 Application rectally 2 (two) times daily. As needed for rectal bleeding. Patient taking differently: Place 1 Application rectally 2 (two) times daily as needed for hemorrhoids (rectal bleeding). 12/06/21   Gelene Mink, NP  losartan (COZAAR) 25 MG tablet TAKE 1 TABLET BY MOUTH DAILY 02/26/23   Shade Flood, MD  metoprolol tartrate (LOPRESSOR) 25 MG tablet TAKE 1 TABLET BY MOUTH TWICE  DAILY 01/08/23   Antoine Poche, MD  Multiple Minerals (CALCIUM-MAGNESIUM-ZINC) TABS Take 1 tablet by mouth in the morning.    [provider]  NP THYROID 120 MG tablet Take 60 mg by mouth daily before breakfast. 01/11/22   [provider]  progesterone (PROMETRIUM) 200 MG capsule Take 2 capsules (400 mg total) by mouth daily. Patient taking differently: Take 400 mg by mouth at bedtime. 01/19/21 10/03/23  Shade Flood, MD  RABEprazole (ACIPHEX) 20 MG tablet Take 1 tablet (20 mg total) by  mouth 2 (two) times daily before a meal. 03/12/23   Gelene Mink, NP  rosuvastatin (CRESTOR) 40 MG tablet TAKE 1 TABLET BY MOUTH DAILY 10/09/22   Antoine Poche, MD  triamcinolone ointment (KENALOG) 0.1 % Apply 1 Application topically 2 (two) times daily as needed. Patient taking differently: Apply 1 Application topically 2 (two) times daily as needed (irritation). 11/25/21  Alfonse Spruce, MD  varenicline (CHANTIX CONTINUING MONTH PAK) 1 MG tablet Take 1 tablet (1 mg total) by mouth 2 (two) times daily. Start this after finishing initial low dose week 05/10/23   Dolores Frame, MD     Family History  Problem Relation Age of Onset   Hypertension Mother    Hypertension Father    Heart disease Father        before age 58   Other Father        varicose veins   COPD Father    Colon cancer Neg Hx    Celiac disease Neg Hx    Inflammatory bowel disease Neg Hx     Social History   Socioeconomic History   Marital status: Married    Spouse name: Raschie   Number of children: 0   Years of education: some coll   Highest education level: Some college, no degree  Occupational History   Occupation: Financial planner: [employer]    Comment: [employer]  Tobacco Use   Smoking status: Every Day    Current packs/day: 0.00    Average packs/day: 0.5 packs/day for 40.0 years (20.0 ttl pk-yrs)    Types: Cigarettes    Start date: 05/1981    Last attempt to quit: 05/2021    Years since quitting: 1.9   Smokeless tobacco: Never  Vaping Use   Vaping status: Never Used  Substance and Sexual Activity   Alcohol use: Yes    Alcohol/week: 2.0 standard drinks of alcohol    Types: 2 Cans of beer per week   Drug use: No   Sexual activity: Yes    Birth control/protection: None  Other Topics Concern   Not on file  Social History Narrative   Patient is married   for 5 years . Patient works at [employer].. 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   Social Drivers of Corporate investment banker Strain: Low Risk  (04/16/2023)   Overall Financial Resource Strain (CARDIA)    Difficulty of Paying Living Expenses: Not hard at all  Food Insecurity: No Food Insecurity (04/16/2023)   Hunger Vital Sign    Worried About Running Out of Food in the Last Year: Never true    Ran Out of Food in the Last Year: Never true  Transportation Needs: No Transportation Needs (04/16/2023)   PRAPARE - Administrator, Civil Service (Medical): No    Lack of Transportation (Non-Medical): No  Physical Activity: Insufficiently Active (04/16/2023)   Exercise Vital Sign    Days of Exercise per Week: 2 days    Minutes of Exercise per Session: 30 min  Stress: Stress Concern Present (04/16/2023)   [employer] of Occupational Health - Occupational Stress Questionnaire    Feeling of Stress : To some extent  Social Connections: Socially Isolated (04/16/2023)   Social Connection and Isolation Panel [NHANES]    Frequency of Communication with Friends and Family: Once a week    Frequency of Social Gatherings with Friends and Family: Once a week    Attends Religious Services: Never    Database administrator or Organizations: No    Attends Engineer, structural: Not on file    Marital Status: Married     Vital Signs: There were no vitals taken for this visit.  No physical exam was performed in lieu of virtual telephone visit.    Imaging: ABI (06/03/21--> 11/25/21) R = 1.11 -->  1.07 L = 0.93 --> 1.02     CTA AP (06/09/21)  Interval occlusion of SMA stent, with proximal native reconstitution.  Prominent Arc of Riolan.  Unchanged mild/moderate celiac stenosis, widely patent IMA.     CTA AP (07/15/21)  Recanalized and relined SMA stent, widely patent.   Angiogram (07/12/21)  Mild proximal left EIA stenosis and mild in-stent restenosis.     10/11/21  Patent SMA stent   US Carotid 01/13/22 IMPRESSION: Color duplex  indicates minimal heterogeneous plaque, with no hemodynamically significant stenosis by duplex criteria in the extracranial cerebrovascular circulation.    [place] Mesenteric 01/13/22 MPRESSION: Directed duplex of the mesenteric vasculature demonstrates patent stent with questionable mid stent stenosis versus artifact.  Celiac artery not evaluated given the poor sonographic window.   [place] Mesenteric 04/21/22 IMPRESSION: 1. Elevated peak systolic velocity in the proximal and mid aspect of the SMA may reflect turbulence related to the presence of the stent. 2. The stent remains patent. 3. Celiac axis is largely obscured by bowel gas.    [place] Mesenteric 08/15/22 IMPRESSION: Increasing peak systolic velocity throughout the stented segment of the SMA concerning for possible recurrent in stent stenosis. The vascular channel was not particularly well visualized in stenosis cannot be visually confirmed.   [place] Mesenteric 05/14/23 SMA: Proximal 199 cm/sec. Mid 186 cm/sec. Distal 375 cm/sec. Previously 306, 389 and 361 cm/s respectively.  IMPRESSION: Persistently elevated velocities within the SMA stent, and weak and delayed Doppler waveform distal to the stent. Findings are suspicious for in/stent stenosis. If continued clinical concern, consider CTA abdomen for further evaluation.   Labs:  CBC: Recent Labs    07/26/22 1600 04/19/23 1623  WBC 9.8 8.6  HGB 12.8 13.0  HCT 41.2 39.6  PLT 295 292.0    COAGS: No results for input(s): "INR", "APTT" in the last 8760 hours.  BMP: Recent Labs    07/18/22 0929 04/19/23 1623  NA 140 139  K 4.5 4.3  CL 102 105  CO2 23 25  GLUCOSE 104* 88  BUN 10 14  CALCIUM 9.7 9.2  CREATININE 1.04* 0.93    LIVER FUNCTION TESTS: Recent Labs    07/18/22 0929 04/19/23 1623  BILITOT 0.3 0.3  AST 19 19  ALT 18 15  ALKPHOS 70 57  PROT 7.1 7.2  ALBUMIN 4.2 4.2    Assessment and Plan: 69 year old female with history of atherosclerotic disease  including peripheral artery disease and chronic mesenteric ischemia now status post superior mesenteric artery covered stent placement on 10/29/20 and stent recanalization with re-lining on 07/12/21. She was hospitalized November 2023 for acute appendicitis with thrombosed SMA stent. She underwent mesenteric angiogram (CO2 + IVUS) and SMA thrombectomy with DCB angioplasty of the previously placed stents on 03/21/22. Her mesenteric duplex 08/16/22 showed increased velocities but patency of the SMA stents. She continues to remain essentially asymptomatic from a mesenteric ischemia standpoint.  Follow up in 6 months in clinic.  No need for repeat imaging at that time. Again encouraged smoking cessation.  Continue aspirin and plavix indefinitely.  Marliss Coots, MD Pager: [phone]    I spent a total of 25 Minutes in virtual telephone clinical consultation, greater than 50% of which was counseling/coordinating care for chronic mesenteric ischemia.

## 2023-05-21 ENCOUNTER — Ambulatory Visit
Admission: RE | Admit: 2023-05-21 | Discharge: 2023-05-21 | Disposition: A | Discharge status: Home / Self Care | Payer: Medicare Other | Source: Ambulatory Visit | Attending: Interventional Radiology | Admitting: Interventional Radiology

## 2023-05-21 DIAGNOSIS — K551 Chronic vascular disorders of intestine: Secondary | ICD-10-CM

## 2023-05-21 HISTORY — PX: IR RADIOLOGIST EVAL & MGMT: IMG5224

## 2023-05-22 ENCOUNTER — Encounter: Payer: Self-pay | Admitting: *Deleted

## 2023-06-18 ENCOUNTER — Telehealth: Payer: Self-pay | Admitting: *Deleted

## 2023-06-18 NOTE — Telephone Encounter (Signed)
 Spoke with patent regarding new appointment date and time for sleep consutl(T Parrett, NP not in the afternoon of 07/10/23)  Monday 09/03/23 at 10:00 am.  Will mail new information and she voiced her understanding

## 2023-07-10 ENCOUNTER — Institutional Professional Consult (permissible substitution): Payer: Medicare Other | Admitting: Adult Health

## 2023-07-16 ENCOUNTER — Encounter: Payer: Self-pay | Admitting: *Deleted

## 2023-07-18 ENCOUNTER — Encounter: Payer: Self-pay | Admitting: Cardiology

## 2023-07-18 ENCOUNTER — Ambulatory Visit: Payer: Medicare Other | Attending: Cardiology | Admitting: Cardiology

## 2023-07-18 VITALS — BP 130/82 | HR 74 | Ht 66.0 in | Wt 234.8 lb

## 2023-07-18 DIAGNOSIS — I10 Essential (primary) hypertension: Secondary | ICD-10-CM | POA: Diagnosis not present

## 2023-07-18 DIAGNOSIS — Z79899 Other long term (current) drug therapy: Secondary | ICD-10-CM | POA: Diagnosis not present

## 2023-07-18 DIAGNOSIS — E782 Mixed hyperlipidemia: Secondary | ICD-10-CM | POA: Diagnosis not present

## 2023-07-18 DIAGNOSIS — Z952 Presence of prosthetic heart valve: Secondary | ICD-10-CM

## 2023-07-18 NOTE — Progress Notes (Signed)
 Clinical Summary Michelle Patton is a 68 y.o.female seen today for follow up of the following medicla problems   1.Chest pain/fatigue - feeling exhausted. Low energy, feels drained.Some hypersomnolence. Sleeps 7.5 to 8 hours per night. - sedentary lifestyle, no specific exertional symptoms   - chest pain under right breast, sharp pain. Can take her breath away. Lasts 1-2 minutes. 1-2 times per week, occur at rest or with activity. Not positional. No other associated symptoms.  - recent episode mid to left chest, same type of feeling.  - normal TSH, Hgb    Jan 2025 MRA: No evidence of aortic aneurysm or dissection.No pseudoaneurysm visible after prior aortic repair.    - chest pains have resolved.      2. Aortic stenosis -  hx in 1968 at age of 42 of supravavlular aortic stenosis, corrective surgery with patching at that time.   - found to have developed severe aortic valvular stenosis with possible recurrence of supravalvular stenosis as well based on imaging. 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   - Medtronic Freestyle stentless porcine valve/aortic root graft (size 21 mm, model # 995, serial # [id number]) -09/2017 echo LVEF 65-70%, no WMAS, grade I diastolic dysfunction, normal AVR.  -12/2021 echo: LVEF 65-70%, 21 mm Medtronic Freestyle stentless porcine valve. Peak and mean gradients through the valve are 13 and 5 mm Hg respectively.. The aortic valve has been repaired/replaced. Aortic valve regurgitation is not visualized.    Jan 2025 MRA: No evidence of aortic aneurysm or dissection.No pseudoaneurysm visible after prior aortic repair.   - No SOB/DOE   2. PAD - followed by vascular, history of left external iliac stent.   - no recent smptoms.      3. HTN - she is compliant iwht meds  - cough on ACE-I, discontinued - reported some insomina initialyl with losartan but  unclear if truly related     - compliant with meds   4. 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  - she is on ASA/plavix per IR. She reports was told need lifelong plavix due to multiple SMA stents over time.         6. OSA - moderate OSA by 08/2020. At this time was 201 lbs.  - cpap machine was expensive.            SH: [name] is also a patient of mine. 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      Trip to [place] to visit her mother. Born and raised in [place],moved to [place]. Father passed in February.  Enjoys riding motorcycles  Upcoming retirement.  Past Medical History:  Diagnosis Date   Aortic regurgitation 07/20/2014   Aortic stenosis, severe 07/20/2014   Arthritis    DVT (deep venous thrombosis) (HCC)    Family history of adverse reaction to anesthesia    Reports father deliurm in his  70's with CABG   GERD (gastroesophageal reflux disease)    History of pneumonia    Hx of repair of rotator cuff    R and L rotator cuff repair   Hyperlipidemia    Hypertension    Insomnia, unspecified    patient stated d/t menopause   Mesenteric ischemia (HCC)    Muscle weakness (generalized)    Peripheral artery disease (HCC)    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   Redo sternotomy for 21 mm Medtronic Freestyle porcine aortic root graft w/ reimplantation of left main and right coronary arteries   Sleep apnea    diagnosed multiple years ago at [facility] aortic stenosis, congenital - s/p repair during  childhood      Allergies  Allergen Reactions   Iodinated Contrast Media Anaphylaxis, Shortness Of Breath, Itching, Swelling and Other (See Comments)    Patient was given Omni 350 and suffered from itching, chest pain and shortness of breath. Patient was taken to ED after onset of reaction. 10/11/2021  MD zackowski noted pt had tongue and throat swelling, had to give pt epinephrine    Sulfa Antibiotics Nausea And Vomiting     Current Outpatient Medications  Medication Sig Dispense Refill   acetaminophen (TYLENOL) 325 MG tablet Take 650 mg by mouth every 6 (six) hours as needed for mild pain, moderate pain, fever or headache.     aspirin EC 81 MG tablet Take 1 tablet (81 mg total) by mouth daily. Swallow whole. 90 tablet 3   Cholecalciferol (VITAMIN D-3 PO) Take 1 capsule by mouth in the morning and at bedtime.     clopidogrel (PLAVIX) 75 MG tablet Take 1 tablet (75 mg total) by mouth daily. 90 tablet 3   estradiol (ESTRACE) 2 MG tablet Take 1 tablet (2 mg total) by mouth daily. 30 tablet 3   ezetimibe (ZETIA) 10 MG tablet TAKE 1 TABLET BY MOUTH DAILY 90 tablet 3   Flaxseed, Linseed, (FLAX SEED OIL PO) Take 1 capsule by mouth in the morning.     fluticasone (FLONASE) 50 MCG/ACT nasal spray Place 1 spray into both nostrils 2 (two) times daily. 16 g 2   hydrocortisone (ANUSOL-HC) 2.5 % rectal cream Place 1 Application rectally 2 (two) times daily. As needed for rectal bleeding. (Patient taking differently: Place 1 Application rectally 2 (two) times daily as needed for hemorrhoids (rectal bleeding).) 30 g 1   losartan (COZAAR) 25 MG tablet TAKE 1 TABLET BY MOUTH DAILY 90 tablet 3   metoprolol tartrate (LOPRESSOR) 25 MG tablet TAKE 1 TABLET BY MOUTH TWICE  DAILY 180 tablet 3   Multiple Minerals (CALCIUM-MAGNESIUM-ZINC) TABS Take 1 tablet by mouth in the morning.     NP THYROID 120 MG tablet Take 60 mg by mouth daily before breakfast.     progesterone (PROMETRIUM) 200 MG capsule Take 2 capsules  (400 mg total) by mouth daily. (Patient taking differently: Take 400 mg by mouth at bedtime.) 180 capsule 1   RABEprazole (ACIPHEX) 20 MG tablet Take 1 tablet (20 mg total) by mouth 2 (two) times daily before a meal. 180 tablet 3   rosuvastatin (CRESTOR) 40 MG tablet TAKE 1 TABLET BY MOUTH DAILY 90 tablet 3   triamcinolone ointment (KENALOG) 0.1 % Apply 1 Application topically 2 (two) times daily as needed. (Patient taking differently: Apply 1 Application topically 2 (two) times daily as needed (irritation).) 454 g 0   varenicline (CHANTIX CONTINUING MONTH  PAK) 1 MG tablet Take 1 tablet (1 mg total) by mouth 2 (two) times daily. Start this after finishing initial low dose week 60 tablet 2   No current facility-administered medications for this visit.     Past Surgical History:  Procedure Laterality Date   ABDOMINAL AORTAGRAM  06/24/2012   ABDOMINAL AORTAGRAM N/A 06/24/2012   Procedure: ABDOMINAL Ronny Flurry;  Surgeon: Chuck Hint, MD;  Location: [facility] CATH LAB;  Service: Cardiovascular;  Laterality: N/A;   AORTIC VALVE REPLACEMENT N/A 10/07/2014   Procedure: REDO AORTIC VALVE REPLACEMENT (AVR);  Surgeon: Purcell Nails, MD;  Location: [facility] OR;  Service: Open Heart Surgery;  Laterality: N/A;   ASCENDING AORTIC ROOT REPLACEMENT N/A 10/07/2014   Procedure: ASCENDING AORTIC ROOT REPLACEMENT;  Surgeon: Purcell Nails, MD;  Location: [facility] OR;  Service: Open Heart Surgery;  Laterality: N/A;   BIOPSY  09/27/2016   Procedure: BIOPSY;  Surgeon: Corbin Ade, MD;  Location: [facility];  Service: Endoscopy;;  colon   BIOPSY  10/18/2020   Procedure: BIOPSY;  Surgeon: Lanelle Bal, DO;  Location: [facility];  Service: Endoscopy;;   BREAST REDUCTION SURGERY Bilateral 01/21/2018   Procedure: BREAST REDUCTION WITH LIPOSUCTION;  Surgeon: Louisa Second, MD;  Location: [facility];  Service: Plastics;  Laterality: Bilateral;   CARDIAC VALVE SURGERY  1968   CARPAL TUNNEL  RELEASE Right 03/02/2020   Procedure: CARPAL TUNNEL RELEASE;  Surgeon: Vickki Hearing, MD;  Location: [facility];  Service: Orthopedics;  Laterality: Right;   CATARACT EXTRACTION W/PHACO Left 05/30/2019   Procedure: CATARACT EXTRACTION PHACO AND INTRAOCULAR LENS PLACEMENT (IOC) (CDE: 4.94  );  Surgeon: Fabio Pierce, MD;  Location: [facility];  Service: Ophthalmology;  Laterality: Left;   CATARACT EXTRACTION W/PHACO Right 06/16/2019   Procedure: CATARACT EXTRACTION PHACO AND INTRAOCULAR LENS PLACEMENT (IOC);  Surgeon: Fabio Pierce, MD;  Location: [facility];  Service: Ophthalmology;  Laterality: Right;  CDE: 4.64   CERVICAL FUSION     CHOLECYSTECTOMY     COLONOSCOPY WITH PROPOFOL N/A 09/27/2016   Dr. Jena Gauss: Diverticulosis, several tubular adenomas removed ranging 4 to 7 mm in size, internal grade 1 hemorrhoids, terminal ileum normal, segmental biopsies negative for microscopic colitis.  Next colonoscopy June 2021   COLONOSCOPY WITH PROPOFOL N/A 05/06/2020   internal hemorrhoids, sigmoid diverticulosis. Surveillance colonoscopy due in 2027   COLONOSCOPY WITH PROPOFOL N/A 10/06/2022   Procedure: COLONOSCOPY WITH PROPOFOL;  Surgeon: Dolores Frame, MD;  [facility];  Service: Gastroenterology;  Laterality: N/A;  10:45AM;ASA 1   ESOPHAGOGASTRODUODENOSCOPY (EGD) WITH PROPOFOL N/A 09/27/2016   Dr. Jena Gauss: Small hiatal hernia, mild Schatzki ring status post disruption, LA grade a esophagitis   ESOPHAGOGASTRODUODENOSCOPY (EGD) WITH PROPOFOL N/A 10/18/2020   non-obstructing mild Schatzki ring, gastritis s/ biopsy. Negative H.pylori.   HEMOSTASIS CLIP PLACEMENT  10/06/2022   Procedure: HEMOSTASIS CLIP PLACEMENT;  Surgeon: Dolores Frame, MD;  [facility];  Service: Gastroenterology;;   ILIAC ARTERY STENT Left 12/2007   IR ANGIOGRAM EXTREMITY BILATERAL  07/12/2021   IR ANGIOGRAM VISCERAL SELECTIVE  10/29/2020   IR ANGIOGRAM VISCERAL SELECTIVE  07/12/2021   IR  ANGIOGRAM VISCERAL SELECTIVE  03/21/2022   IR AORTAGRAM ABDOMINAL SERIALOGRAM  03/21/2022   IR IVUS EACH ADDITIONAL NON CORONARY VESSEL  07/12/2021   IR IVUS EACH ADDITIONAL NON CORONARY VESSEL  03/21/2022   IR PTA NON CORO-LOWER EXTREM  03/21/2022   IR RADIOLOGIST EVAL & MGMT  10/26/2020  IR RADIOLOGIST EVAL & MGMT  11/10/2020   IR RADIOLOGIST EVAL & MGMT  02/09/2021   IR RADIOLOGIST EVAL & MGMT  06/16/2021   IR RADIOLOGIST EVAL & MGMT  08/15/2021   IR RADIOLOGIST EVAL & MGMT  10/18/2021   IR RADIOLOGIST EVAL & MGMT  01/11/2022   IR RADIOLOGIST EVAL & MGMT  03/02/2022   IR RADIOLOGIST EVAL & MGMT  05/11/2022   IR RADIOLOGIST EVAL & MGMT  08/18/2022   IR RADIOLOGIST EVAL & MGMT  10/18/2022   IR RADIOLOGIST EVAL & MGMT  05/21/2023   IR THROMBECT PRIM MECH INIT (INCLU) MOD SED  03/21/2022   IR THROMBECT SEC MECH MOD SED  07/12/2021   IR TRANSCATH PLC STENT 1ST ART NOT LE CV CAR VERT CAR  10/29/2020   IR TRANSCATH PLC STENT 1ST ART NOT LE CV CAR VERT CAR  07/12/2021   IR US GUIDE VASC ACCESS RIGHT  10/29/2020   IR US GUIDE VASC ACCESS RIGHT  07/12/2021   IR US GUIDE VASC ACCESS RIGHT  03/21/2022   KNEE ARTHROSCOPY WITH MEDIAL MENISECTOMY Right 01/10/2018   Procedure: RIGHT KNEE ARTHROSCOPY WITH PARTIAL MEDIAL MENISECTOMY;  Surgeon: Vickki Hearing, MD;  Location: [facility];  Service: Orthopedics;  Laterality: Right;   LAPAROSCOPIC APPENDECTOMY N/A 08/03/2022   Procedure: APPENDECTOMY LAPAROSCOPIC;  Surgeon: Axel Filler, MD;  Location: [facility] OR;  Service: General;  Laterality: N/A;   LEFT AND RIGHT HEART CATHETERIZATION WITH CORONARY ANGIOGRAM N/A 07/31/2014   Procedure: LEFT AND RIGHT HEART CATHETERIZATION WITH CORONARY ANGIOGRAM;  Surgeon: Kathleene Hazel, MD;  Location: [facility] CATH LAB;  Service: Cardiovascular;  Laterality: N/A;   MALONEY DILATION N/A 09/27/2016   Procedure: Elease Hashimoto DILATION;  Surgeon: Corbin Ade, MD;  [facility];  Service: Endoscopy;  Laterality: N/A;    POLYPECTOMY  09/27/2016   Procedure: POLYPECTOMY;  Surgeon: Corbin Ade, MD;  [facility];  Service: Endoscopy;;  colon   POLYPECTOMY  10/06/2022   Procedure: POLYPECTOMY;  Surgeon: Dolores Frame, MD;  [facility];  Service: Gastroenterology;;   ROTATOR CUFF REPAIR Bilateral    SUBMUCOSAL LIFTING INJECTION  10/06/2022   Procedure: SUBMUCOSAL LIFTING INJECTION;  Surgeon: Dolores Frame, MD;  [facility];  Service: Gastroenterology;;   TEE WITHOUT CARDIOVERSION N/A 07/07/2014   Procedure: TRANSESOPHAGEAL ECHOCARDIOGRAM (TEE);  Surgeon: Antoine Poche, MD;  [facility];  Service: Cardiology;  Laterality: N/A;   TEE WITHOUT CARDIOVERSION N/A 07/20/2014   Procedure: TRANSESOPHAGEAL ECHOCARDIOGRAM (TEE) WITH PROPOFOL;  Surgeon: Antoine Poche, MD;  Location: [facility];  Service: Endoscopy;  Laterality: N/A;   TEE WITHOUT CARDIOVERSION N/A 10/07/2014   Procedure: TRANSESOPHAGEAL ECHOCARDIOGRAM (TEE);  Surgeon: Purcell Nails, MD;  Location: [facility] OR;  Service: Open Heart Surgery;  Laterality: N/A;     Allergies  Allergen Reactions   Iodinated Contrast Media Anaphylaxis, Shortness Of Breath, Itching, Swelling and Other (See Comments)    Patient was given Omni 350 and suffered from itching, chest pain and shortness of breath. Patient was taken to ED after onset of reaction. 10/11/2021  MD zackowski noted pt had tongue and throat swelling, had to give pt epinephrine    Sulfa Antibiotics Nausea And Vomiting      Family History  Problem Relation Age of Onset   Hypertension Mother    Hypertension Father    Heart disease Father        before age 55   Other  Father        varicose veins   COPD Father    Colon cancer Neg Hx    Celiac disease Neg Hx    Inflammatory bowel disease Neg Hx      Social History Ms. Lietzke reports that she has been smoking cigarettes. She started smoking about 42 years ago. She has a 20 pack-year  smoking history. She has never used smokeless tobacco. Ms. Debell reports current alcohol use of about 2.0 standard drinks of alcohol per week.    Physical Examination Today's Vitals   07/18/23 1605  BP: 130/82  Pulse: 74  SpO2: 97%  Weight: 234 lb 12.8 oz (106.5 kg)  Height: 5\' 6"  (1.676 m)   Body mass index is 37.9 kg/m.  Gen: resting comfortably, no acute distress HEENT: no scleral icterus, pupils equal round and reactive, no palptable cervical adenopathy,  CV: RRR, 2/6 systolic murmur rusb, no jvd Resp: Clear to auscultation bilaterally GI: abdomen is soft, non-tender, non-distended, normal bowel sounds, no hepatosplenomegaly MSK: extremities are warm, no edema.  Skin: warm, no rash Neuro:  no focal deficits Psych: appropriate affect   Diagnostic Studies 11/2015 echo Study Conclusions   - Left ventricle: The cavity size was normal. Wall thickness was   normal. Systolic function was vigorous. The estimated ejection   fraction was in the range of 65% to 70%. Wall motion was normal;   there were no regional wall motion abnormalities. Doppler   parameters are consistent with abnormal left ventricular   relaxation (grade 1 diastolic dysfunction). - Aortic valve: 21 mm Medtronic Freestyle stentless porcine   valve/aortic root graft in place. There was no significant   regurgitation. Peak gradient (S): 19 mm Hg. Valve area (Vmax):   1.83 cm^2. - Mitral valve: Calcified annulus. There was trivial regurgitation. - Right ventricle: The cavity size was mildly dilated. - Right atrium: Central venous pressure (est): 3 mm Hg. - Atrial septum: The septum bowed from right to left, consistent   with increased right atrial pressure. - Tricuspid valve: There was mild regurgitation. - Pulmonic valve: Peak gradient (S): 19 mm Hg. - Pulmonary arteries: PA peak pressure: 21 mm Hg (S). - Pericardium, extracardiac: There was no pericardial effusion.   Impressions:   - Normal LV wall  thickness with LVEF 65-70%. Grade 1 diastolic   dysfunction. MAC with trivial mitral regurgitation. Bioprosthesis   in aortic position as described above with no significant   perivalvular regurgitation and peak gradient 19 mmHg. Mild RV   enlargement. Mild tricuspid regurgitation with PASP estimated 21   mmHg.    07/2014 cath Hemodynamic Findings: Ao:   107/55             LV: 190/12/15 RA:  1         RV:  41/2/6 PA:   26/6 (mean 15)     PCWP:  7 Fick Cardiac Output: 4.76 L/min Fick Cardiac Index: 2.38 L/min/m2 Central Aortic Saturation: 96% Pulmonary Artery Saturation: 66%   Aortic valve data:  Peak to peak gradient 83 mm Hg Mean gradient 56 mmHg AVA 0.52 cm2   Angiographic Findings:   Left main: Normal caliber vessel with no obstructive disease.    Left Anterior Descending Artery: Large caliber vessel that courses to the apex. Moderate caliber diagonal Sweet Jarvis. No obstructive disease.    Circumflex Artery: Large caliber vessel with moderate caliber obtuse marginal Dereke Neumann. No obstructive disease.    Right Coronary Artery: Large dominant vessel with no obstructive disease.  Left Ventricular Angiogram: LVEF=60%   Aortic root angiogram: The aortic root is not enlarged. There is supravalvular density.    Aortic valve calcification noted on plain fluoroscopy.    Impression: 1. No angiographic evidence of CAD 2. Normal LV systolic function 3. Severe gradient across the aortic valve into the aorta with density noted in the aortic root. Cannot exclude recurrent supravalvular stenosis along with aortic valve stenosis. 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  4. Normal filling pressures     09/2017 echo Study Conclusions   - Left ventricle: The cavity size was normal. Wall thickness was   normal. Systolic function was vigorous. The estimated ejection   fraction was in the range of 65% to 70%. Wall motion was normal;   there were no  regional wall motion abnormalities. Doppler   parameters are consistent with abnormal left ventricular   relaxation (grade 1 diastolic dysfunction). Doppler parameters   are consistent with indeterminate ventricular filling pressure. - Aortic valve: 21 mm Medtronic Freestyle stentless porcine   valve/aortic root graft in place. There was no significant   regurgitation. There was no stenosis. Peak gradient (S): 11 mm   Hg. - Mitral valve: Mildly calcified annulus. Normal thickness leaflets   . - Tricuspid valve: There was mild regurgitation. - Pulmonary arteries: PA peak pressure: 35 mm Hg (S).   12/2021 carotid US IMPRESSION: Color duplex indicates minimal heterogeneous plaque, with no hemodynamically significant stenosis by duplex criteria in the extracranial cerebrovascular circulation.         12/2021 echo 1. Left ventricular ejection fraction, by estimation, is 65 to 70%. The  left ventricle has normal function. The left ventricle has no regional  wall motion abnormalities. Left ventricular diastolic parameters are  indeterminate.   2. Right ventricular systolic function is normal. The right ventricular  size is normal. There is mildly elevated pulmonary artery systolic  pressure.   3. Right atrial size was mildly dilated.   4. The mitral valve is normal in structure. Mild mitral valve  regurgitation.   5. S/p AVR. (21 mm Medtronic Freestyle stentless porcine valve. Peak and  mean gradients through the valve are 13 and 5 mm Hg respectively.. The  aortic valve has been repaired/replaced. Aortic valve regurgitation is not  visualized.   6. The inferior vena cava is normal in size with greater than 50%  respiratory variability, suggesting right atrial pressure of 3 mmHg.         Assessment and Plan   1.History of AVR/aortic repair - no recent symptoms - most recent imaging of echo and aorta have looked good - continue to monitor   2. HLD - repeat lipid panel - goal  LDL <55 based on prior PAD interverntions  3. HTN - at goal, continue current meds    Antoine Poche, M.D.

## 2023-07-18 NOTE — Patient Instructions (Signed)
 Medication Instructions:   Continue all current medications.   Labwork:  FLP - order given today Reminder:  Nothing to eat or drink after 12 midnight prior to labs. Office will contact with results via phone, letter or mychart.     Testing/Procedures:  none  Follow-Up:  6 months   Any Other Special Instructions Will Be Listed Below (If Applicable).   If you need a refill on your cardiac medications before your next appointment, please call your pharmacy.

## 2023-08-06 ENCOUNTER — Ambulatory Visit (INDEPENDENT_AMBULATORY_CARE_PROVIDER_SITE_OTHER): Admitting: Gastroenterology

## 2023-08-06 ENCOUNTER — Encounter (INDEPENDENT_AMBULATORY_CARE_PROVIDER_SITE_OTHER): Payer: Self-pay | Admitting: Gastroenterology

## 2023-08-06 VITALS — BP 136/80 | HR 86 | Temp 97.1°F | Ht 66.0 in | Wt 234.6 lb

## 2023-08-06 DIAGNOSIS — K551 Chronic vascular disorders of intestine: Secondary | ICD-10-CM

## 2023-08-06 DIAGNOSIS — Z87891 Personal history of nicotine dependence: Secondary | ICD-10-CM

## 2023-08-06 DIAGNOSIS — R109 Unspecified abdominal pain: Secondary | ICD-10-CM | POA: Diagnosis not present

## 2023-08-06 MED ORDER — DICYCLOMINE HCL 10 MG PO CAPS: 10.0000 mg | ORAL_CAPSULE | Freq: Two times a day (BID) | ORAL | 0 refills | Status: AC | PRN

## 2023-08-06 NOTE — Patient Instructions (Signed)
 Schedule MR angio of the abdomen with IV contrast Continue smoking cessation -great job so far! Start Bentyl 1 tablet q12h as needed for abdominal pain control We will discuss repeat colonoscopy in follow-up appointment

## 2023-08-06 NOTE — Progress Notes (Signed)
 Katrinka Blazing, M.D. Gastroenterology & Hepatology [facility] Gastroenterology [address] [place], [place] [address]  Primary Care Physician: Shade Flood, MD [address] [place] [address]  I will communicate my assessment and recommendations to the referring MD via EMR.  Problems: Chronic mesenteric ischemia status post stent placement on 10/29/2020 complicated by recurrent thrombosis status post thrombus aspiration, SMA stent recanalization and balloon angioplasty Recurrent abdominal pain.   History of Present Illness: Lashina Milles is a 68 y.o. female with past medical history of CAD, history of chronic abdominal pain secondary to chronic mesenteric ischemia status post stent placement on 10/29/2020 complicated by recurrent thrombosis status post SMA stent recanalization and balloon angioplasty, severe aortic stenosis, DVT, GERD, hyperlipidemia, hypertension, peripheral arterial disease, sleep apnea, who presents for follow up of chronic mesenteric ischemia and abdominal pain.  The patient was last seen on 03/19/2019 for. At that time, the patient was advised to continue smoking cessation and was refilled with Chantix.  Patient had mesenteric ultrasound performed on 05/14/2023 as part of surveillance of chronic mesenteric ischemia.  She was found to have elevated velocities in the SMA stent and weak/delayed Doppler which was suspicious for in/stent stenosis.  She was seen by Dr. Elby Showers on 05/21/2023.  As she was asymptomatic, no further intervention was warranted at that point but to monitor clinically.  She was continued on aspirin and Plavix.  Patient reports that she has presented abdominal pain in the R flank, which radiates to the hypogastric area and the RUQ. States the pain is constant, 4/10 in intensity. Pain wakes her up in the middle of the night intermittently. She has not identified anything that makes the pain worse, sometimes sleeps  on her left side to decrease pressure on the abdomen.  Notably, the patient reports that she had constant abdominal pain as well when she was diagnosed with chronic mesenteric ischemia (at that time the pain was located on the left side of her abdomen) and this pain resolved after stent placement.  She has never had classical postprandial pain.  The patient denies having any nausea, vomiting, fever, chills, hematochezia, melena, hematemesis, abdominal distention, diarrhea, jaundice, pruritus or weight loss.  Does not take NSAIDs, only occasionally Tylenol.  Has not smoked in 3 months.  Last EGD:09/2020 Nonobstructing Schatzki's ring, gastritis, some falls had pallor.  Normal duodenum.  Biopsies negative for H. pylori.   Last Colonoscopy: 10/06/2022 - One 12 mm polyp in the cecum, removed with a cold snare. Resected and retrieved via EMR. Clips were placed. Clip manufacturer: [employer]. - One 3 mm polyp in the ascending colon, removed with a cold snare. Resected and retrieved. - One 3 mm polyp in the transverse colon, removed with a cold snare. Resected and retrieved via EMR. - Diverticulosis in the sigmoid colon and in the descending colon. - Non- bleeding internal hemorrhoids.   Pathology showed 3 SSLs, repeat colonoscopy in 1 year due to piecemeal polypectomy.   Past Medical History: Past Medical History:  Diagnosis Date   Aortic regurgitation 07/20/2014   Aortic stenosis, severe 07/20/2014   Arthritis    DVT (deep venous thrombosis) (HCC)    Family history of adverse reaction to anesthesia    Reports father deliurm in his 29's with CABG   GERD (gastroesophageal reflux disease)    History of pneumonia    Hx of repair of rotator cuff    R and L rotator cuff repair   Hyperlipidemia  Hypertension    Insomnia, unspecified    patient stated d/t menopause   Mesenteric ischemia (HCC)    Muscle weakness (generalized)    Peripheral artery disease (HCC)    S/P redo aortic root  replacement with stentless porcine aortic root graft 10/07/2014   Redo sternotomy for 21 mm Medtronic Freestyle porcine aortic root graft w/ reimplantation of left main and right coronary arteries   Sleep apnea    diagnosed multiple years ago at [facility] aortic stenosis, congenital - s/p repair during childhood     Past Surgical History: Past Surgical History:  Procedure Laterality Date   ABDOMINAL AORTAGRAM  06/24/2012   ABDOMINAL AORTAGRAM N/A 06/24/2012   Procedure: ABDOMINAL Ronny Flurry;  Surgeon: Chuck Hint, MD;  Location: [facility] CATH LAB;  Service: Cardiovascular;  Laterality: N/A;   AORTIC VALVE REPLACEMENT N/A 10/07/2014   Procedure: REDO AORTIC VALVE REPLACEMENT (AVR);  Surgeon: Purcell Nails, MD;  Location[facility] OR;  Service: Open Heart Surgery;  Laterality: N/A;   ASCENDING AORTIC ROOT REPLACEMENT N/A 10/07/2014   Procedure: ASCENDING AORTIC ROOT REPLACEMENT;  Surgeon: Purcell Nails, MD;  Location: [facility] OR;  Service: Open Heart Surgery;  Laterality: N/A;   BIOPSY  09/27/2016   Procedure: BIOPSY;  Surgeon: Corbin Ade, MD;  Location: [facility] ENDO SUITE;  Service: Endoscopy;;  colon   BIOPSY  10/18/2020   Procedure: BIOPSY;  Surgeon: Lanelle Bal, DO;  Location: [facility];  Service: Endoscopy;;   BREAST REDUCTION SURGERY Bilateral 01/21/2018   Procedure: BREAST REDUCTION WITH LIPOSUCTION;  Surgeon: Louisa Second, MD;  Location: [facility];  Service: Plastics;  Laterality: Bilateral;   CARDIAC VALVE SURGERY  1968   CARPAL TUNNEL RELEASE Right 03/02/2020   Procedure: CARPAL TUNNEL RELEASE;  Surgeon: Vickki Hearing, MD;  Location: [facility] ORS;  Service: Orthopedics;  Laterality: Right;   CATARACT EXTRACTION W/PHACO Left 05/30/2019   Procedure: CATARACT EXTRACTION PHACO AND INTRAOCULAR LENS PLACEMENT (IOC) (CDE: 4.94  );  Surgeon: Fabio Pierce, MD;  Location: [facility] ORS;  Service: Ophthalmology;  Laterality: Left;   CATARACT EXTRACTION  W/PHACO Right 06/16/2019   Procedure: CATARACT EXTRACTION PHACO AND INTRAOCULAR LENS PLACEMENT (IOC);  Surgeon: Fabio Pierce, MD;  Location: [facility] ORS;  Service: Ophthalmology;  Laterality: Right;  CDE: 4.64   CERVICAL FUSION     CHOLECYSTECTOMY     COLONOSCOPY WITH PROPOFOL N/A 09/27/2016   Dr. Jena Gauss: Diverticulosis, several tubular adenomas removed ranging 4 to 7 mm in size, internal grade 1 hemorrhoids, terminal ileum normal, segmental biopsies negative for microscopic colitis.  Next colonoscopy June 2021   COLONOSCOPY WITH PROPOFOL N/A 05/06/2020   internal hemorrhoids, sigmoid diverticulosis. Surveillance colonoscopy due in 2027   COLONOSCOPY WITH PROPOFOL N/A 10/06/2022   Procedure: COLONOSCOPY WITH PROPOFOL;  Surgeon: Dolores Frame, MD;  [facility]: [facility];  Service: Gastroenterology;  Laterality: N/A;  10:45AM;ASA 1   ESOPHAGOGASTRODUODENOSCOPY (EGD) WITH PROPOFOL N/A 09/27/2016   Dr. Jena Gauss: Small hiatal hernia, mild Schatzki ring status post disruption, LA grade a esophagitis   ESOPHAGOGASTRODUODENOSCOPY (EGD) WITH PROPOFOL N/A 10/18/2020   non-obstructing mild Schatzki ring, gastritis s/ biopsy. Negative H.pylori.   HEMOSTASIS CLIP PLACEMENT  10/06/2022   Procedure: HEMOSTASIS CLIP PLACEMENT;  Surgeon: Dolores Frame, MD;  [facility]: [facility];  Service: Gastroenterology;;   ILIAC ARTERY STENT Left 12/2007   IR ANGIOGRAM EXTREMITY BILATERAL  07/12/2021   IR ANGIOGRAM VISCERAL SELECTIVE  10/29/2020   IR ANGIOGRAM VISCERAL SELECTIVE  07/12/2021   IR ANGIOGRAM VISCERAL SELECTIVE  03/21/2022   IR AORTAGRAM ABDOMINAL SERIALOGRAM  03/21/2022   IR IVUS EACH ADDITIONAL NON CORONARY VESSEL  07/12/2021   IR IVUS EACH ADDITIONAL NON CORONARY VESSEL  03/21/2022   IR PTA NON CORO-LOWER EXTREM  03/21/2022   IR RADIOLOGIST EVAL & MGMT  10/26/2020   IR RADIOLOGIST EVAL & MGMT  11/10/2020   IR RADIOLOGIST EVAL & MGMT  02/09/2021   IR RADIOLOGIST EVAL & MGMT  06/16/2021    IR RADIOLOGIST EVAL & MGMT  08/15/2021   IR RADIOLOGIST EVAL & MGMT  10/18/2021   IR RADIOLOGIST EVAL & MGMT  01/11/2022   IR RADIOLOGIST EVAL & MGMT  03/02/2022   IR RADIOLOGIST EVAL & MGMT  05/11/2022   IR RADIOLOGIST EVAL & MGMT  08/18/2022   IR RADIOLOGIST EVAL & MGMT  10/18/2022   IR RADIOLOGIST EVAL & MGMT  05/21/2023   IR THROMBECT PRIM MECH INIT (INCLU) MOD SED  03/21/2022   IR THROMBECT SEC MECH MOD SED  07/12/2021   IR TRANSCATH PLC STENT 1ST ART NOT LE CV CAR VERT CAR  10/29/2020   IR TRANSCATH PLC STENT 1ST ART NOT LE CV CAR VERT CAR  07/12/2021   IR US GUIDE VASC ACCESS RIGHT  10/29/2020   IR US GUIDE VASC ACCESS RIGHT  07/12/2021   IR US GUIDE VASC ACCESS RIGHT  03/21/2022   KNEE ARTHROSCOPY WITH MEDIAL MENISECTOMY Right 01/10/2018   Procedure: RIGHT KNEE ARTHROSCOPY WITH PARTIAL MEDIAL MENISECTOMY;  Surgeon: Vickki Hearing, MD;  Location: [facility];  Service: Orthopedics;  Laterality: Right;   LAPAROSCOPIC APPENDECTOMY N/A 08/03/2022   Procedure: APPENDECTOMY LAPAROSCOPIC;  Surgeon: Axel Filler, MD;  Location: [facility] OR;  Service: General;  Laterality: N/A;   LEFT AND RIGHT HEART CATHETERIZATION WITH CORONARY ANGIOGRAM N/A 07/31/2014   Procedure: LEFT AND RIGHT HEART CATHETERIZATION WITH CORONARY ANGIOGRAM;  Surgeon: Kathleene Hazel, MD;  Location: [facility] CATH LAB;  Service: Cardiovascular;  Laterality: N/A;   MALONEY DILATION N/A 09/27/2016   Procedure: Elease Hashimoto DILATION;  Surgeon: Corbin Ade, MD;  [facility];  Service: Endoscopy;  Laterality: N/A;   POLYPECTOMY  09/27/2016   Procedure: POLYPECTOMY;  Surgeon: Corbin Ade, MD;  [facility];  Service: Endoscopy;;  colon   POLYPECTOMY  10/06/2022   Procedure: POLYPECTOMY;  Surgeon: Dolores Frame, MD;  [facility];  Service: Gastroenterology;;   ROTATOR CUFF REPAIR Bilateral    SUBMUCOSAL LIFTING INJECTION  10/06/2022   Procedure: SUBMUCOSAL LIFTING INJECTION;  Surgeon:  Dolores Frame, MD;  [facility];  Service: Gastroenterology;;   TEE WITHOUT CARDIOVERSION N/A 07/07/2014   Procedure: TRANSESOPHAGEAL ECHOCARDIOGRAM (TEE);  Surgeon: Antoine Poche, MD;  [facility]: [facility];  Service: Cardiology;  Laterality: N/A;   TEE WITHOUT CARDIOVERSION N/A 07/20/2014   Procedure: TRANSESOPHAGEAL ECHOCARDIOGRAM (TEE) WITH PROPOFOL;  Surgeon: Antoine Poche, MD;  Location: [facility];  Service: Endoscopy;  Laterality: N/A;   TEE WITHOUT CARDIOVERSION N/A 10/07/2014   Procedure: TRANSESOPHAGEAL ECHOCARDIOGRAM (TEE);  Surgeon: Purcell Nails, MD;  Location: [facility] OR;  Service: Open Heart Surgery;  Laterality: N/A;    Family History: Family History  Problem Relation Age of Onset   Hypertension Mother    Hypertension Father    Heart disease Father        before age 75   Other Father        varicose veins   COPD Father  Colon cancer Neg Hx    Celiac disease Neg Hx    Inflammatory bowel disease Neg Hx     Social History: Social History   Tobacco Use  Smoking Status Former   Current packs/day: 0.00   Average packs/day: 0.5 packs/day for 40.0 years (20.0 ttl pk-yrs)   Types: Cigarettes   Start date: 05/1981   Quit date: 05/2021   Years since quitting: 2.2  Smokeless Tobacco Never   Social History   Substance and Sexual Activity  Alcohol Use Yes   Alcohol/week: 2.0 standard drinks of alcohol   Types: 2 Cans of beer per week   Comment: weekly   Social History   Substance and Sexual Activity  Drug Use No    Allergies: Allergies  Allergen Reactions   Iodinated Contrast Media Anaphylaxis, Shortness Of Breath, Itching, Swelling and Other (See Comments)    Patient was given Omni 350 and suffered from itching, chest pain and shortness of breath. Patient was taken to ED after onset of reaction. 10/11/2021  MD zackowski noted pt had tongue and throat swelling, had to give pt epinephrine    Sulfa Antibiotics Nausea And Vomiting     Medications: Current Outpatient Medications  Medication Sig Dispense Refill   acetaminophen (TYLENOL) 325 MG tablet Take 650 mg by mouth every 6 (six) hours as needed for mild pain, moderate pain, fever or headache.     aspirin EC 81 MG tablet Take 1 tablet (81 mg total) by mouth daily. Swallow whole. 90 tablet 3   Cholecalciferol (VITAMIN D-3 PO) Take 1 capsule by mouth in the morning and at bedtime.     clopidogrel (PLAVIX) 75 MG tablet Take 1 tablet (75 mg total) by mouth daily. 90 tablet 3   estradiol (ESTRACE) 2 MG tablet Take 1 tablet (2 mg total) by mouth daily. 30 tablet 3   ezetimibe (ZETIA) 10 MG tablet TAKE 1 TABLET BY MOUTH DAILY 90 tablet 3   Flaxseed, Linseed, (FLAX SEED OIL PO) Take 1 capsule by mouth in the morning.     fluticasone (FLONASE) 50 MCG/ACT nasal spray Place 1 spray into both nostrils 2 (two) times daily. 16 g 2   hydrocortisone (ANUSOL-HC) 2.5 % rectal cream Place 1 Application rectally 2 (two) times daily. As needed for rectal bleeding. (Patient taking differently: Place 1 Application rectally 2 (two) times daily as needed for hemorrhoids (rectal bleeding).) 30 g 1   losartan (COZAAR) 25 MG tablet TAKE 1 TABLET BY MOUTH DAILY 90 tablet 3   metoprolol tartrate (LOPRESSOR) 25 MG tablet TAKE 1 TABLET BY MOUTH TWICE  DAILY 180 tablet 3   Multiple Minerals (CALCIUM-MAGNESIUM-ZINC) TABS Take 1 tablet by mouth in the morning.     NP THYROID 120 MG tablet Take 120 mg by mouth daily before breakfast.     progesterone (PROMETRIUM) 200 MG capsule Take 2 capsules (400 mg total) by mouth daily. (Patient taking differently: Take 400 mg by mouth at bedtime.) 180 capsule 1   RABEprazole (ACIPHEX) 20 MG tablet Take 1 tablet (20 mg total) by mouth 2 (two) times daily before a meal. 180 tablet 3   rosuvastatin (CRESTOR) 40 MG tablet TAKE 1 TABLET BY MOUTH DAILY 90 tablet 3   varenicline (CHANTIX CONTINUING MONTH PAK) 1 MG tablet Take 1 tablet (1 mg total) by mouth 2 (two) times  daily. Start this after finishing initial low dose week 60 tablet 2   No current facility-administered medications for this visit.    Review of  Systems: GENERAL: negative for malaise, night sweats HEENT: No changes in hearing or vision, no nose bleeds or other nasal problems. NECK: Negative for lumps, goiter, pain and significant neck swelling RESPIRATORY: Negative for cough, wheezing CARDIOVASCULAR: Negative for chest pain, leg swelling, palpitations, orthopnea GI: SEE HPI MUSCULOSKELETAL: Negative for joint pain or swelling, back pain, and muscle pain. SKIN: Negative for lesions, rash PSYCH: Negative for sleep disturbance, mood disorder and recent psychosocial stressors. HEMATOLOGY Negative for prolonged bleeding, bruising easily, and swollen nodes. ENDOCRINE: Negative for cold or heat intolerance, polyuria, polydipsia and goiter. NEURO: negative for tremor, gait imbalance, syncope and seizures. The remainder of the review of systems is noncontributory.   Physical Exam: BP 136/80 (BP Location: Left Arm, Patient Position: Sitting, Cuff Size: Large)   Pulse 86   Temp (!) 97.1 F (36.2 C) (Temporal)   Ht 5\' 6"  (1.676 m)   Wt 234 lb 9.6 oz (106.4 kg)   BMI 37.87 kg/m  GENERAL: The patient is AO x3, in no acute distress. HEENT: Head is normocephalic and atraumatic. EOMI are intact. Mouth is well hydrated and without lesions. NECK: Supple. No masses LUNGS: Clear to auscultation. No presence of rhonchi/wheezing/rales. Adequate chest expansion HEART: RRR, normal s1 and s2. ABDOMEN: Tender to palpation in the right flank, no guarding, no peritoneal signs, and nondistended. BS +. No masses. EXTREMITIES: Without any cyanosis, clubbing, rash, lesions or edema. NEUROLOGIC: AOx3, no focal motor deficit. SKIN: no jaundice, no rashes  Imaging/Labs: as above  I personally reviewed and interpreted the available labs, imaging and endoscopic files.  Impression and Plan: Michelle Patton  is a 68 y.o. female with past medical history of CAD, history of chronic abdominal pain secondary to chronic mesenteric ischemia status post stent placement on 10/29/2020 complicated by recurrent thrombosis status post SMA stent recanalization and balloon angioplasty, severe aortic stenosis, DVT, GERD, hyperlipidemia, hypertension, peripheral arterial disease, sleep apnea, who presents for follow up of chronic mesenteric ischemia and abdominal pain.  Patient has presented recurrent constant pain in the right side of her abdomen without other associated symptoms.  Even though she is not having typical postprandial abdominal pain, given the history of chronic mesenteric ischemia and prior documentation of possible stent occlusion in previous Doppler, we will proceed with cross-sectional imaging to confirm this but also to rule out other intra-abdominal pathology causing her pain.  Given her history of anaphylaxis with contrast, we will proceed with MRI angio of the abdomen with IV contrast.  For now, we will control her abdominal pain with Bentyl as needed.  I congratulated the patient for quitting smoking as this will help decrease the likelihood of disease progression in terms of her vasculopathy.  -Schedule MR angio of the abdomen with IV contrast -Continue smoking cessation -great job so far! -Start Bentyl 1 tablet q12h as needed for abdominal pain control -We will discuss repeat colonoscopy in follow-up appointment  All questions were answered.      Samantha Cress, MD Gastroenterology and Hepatology [facility] Gastroenterology

## 2023-08-18 ENCOUNTER — Ambulatory Visit (HOSPITAL_COMMUNITY)
Admission: RE | Admit: 2023-08-18 | Discharge: 2023-08-18 | Disposition: A | Discharge status: Home / Self Care | Source: Ambulatory Visit | Attending: Gastroenterology | Admitting: Gastroenterology

## 2023-08-18 DIAGNOSIS — R109 Unspecified abdominal pain: Secondary | ICD-10-CM | POA: Insufficient documentation

## 2023-08-18 DIAGNOSIS — I771 Stricture of artery: Secondary | ICD-10-CM | POA: Insufficient documentation

## 2023-08-18 DIAGNOSIS — K551 Chronic vascular disorders of intestine: Secondary | ICD-10-CM | POA: Diagnosis present

## 2023-08-18 DIAGNOSIS — Z91041 Radiographic dye allergy status: Secondary | ICD-10-CM | POA: Diagnosis not present

## 2023-08-18 MED ORDER — GADOBUTROL 1 MMOL/ML IV SOLN
10.0000 mL | Freq: Once | INTRAVENOUS | Status: AC | PRN
  Administered 2023-08-18: 10 mL via INTRAVENOUS

## 2023-08-19 ENCOUNTER — Other Ambulatory Visit (INDEPENDENT_AMBULATORY_CARE_PROVIDER_SITE_OTHER): Payer: Self-pay | Admitting: Gastroenterology

## 2023-08-19 DIAGNOSIS — F17200 Nicotine dependence, unspecified, uncomplicated: Secondary | ICD-10-CM

## 2023-08-21 ENCOUNTER — Other Ambulatory Visit: Payer: Self-pay | Admitting: Gastroenterology

## 2023-08-21 ENCOUNTER — Encounter: Payer: Self-pay | Admitting: Gastroenterology

## 2023-08-21 DIAGNOSIS — K559 Vascular disorder of intestine, unspecified: Secondary | ICD-10-CM

## 2023-08-27 NOTE — Progress Notes (Shared)
 This encounter was conducted via the [employer] providing interactive audio and visual communication.  The patient provided verbal consent to conduct a virtual appointment.  The patient was located at their primary residence during this encounter.  Referring Physician(s): Urban Garden  Chief Complaint: The patient is seen in virtual video follow up today s/p mesenteric angiogram, thrombectomy, and drug coated balloon angioplasty of SMA 03/11/22  History of present illness: HPI from last clinic visit 05/21/23 Michelle Patton, 68 year old female, has a medical history significant for HTN, anxiety, DVT, PAD (s/p left external iliac stent 2014) and congenital supraaortic valvular stenosis. As a child she underwent aortic repair and as an adult she underwent root/valve replacement (2016).  In March 2022 she developed abdominal pain that gradually worsened and was most acute after eating. A thorough work up by her PCP and GI teams revealed severe, 3 cm proximal SMA stenosis and focal stenosis of the distal celiac trunk. 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 She was referred to Interventional Radiology for treatment options for chronic mesenteric ischemia and I first met with her October 26, 2020.  She was found to be a candidate for possible stent placement and was scheduled for a mesenteric angiogram with possible intervention. On October 29, 2020  she underwent aortic and superior mesenteric arteriograms with stent placement to the superior mesenteric artery. She tolerated the procedure well and was discharged home the same day. She was started on Aspirin  and Plavix  post-procedure.    The stent improved her pain dramatically and she did well for many months post-procedure.  Follow up imaging February 2023 unfortunately showed stent occlusion and on 07/12/21 she underwent successful SMA stent recanalization with laser atherectomy and placement of a new SMA stent.  I also performed a  diagnostic bilateral lower extremity angiogram for evaluation of foot and calf pain/cramping. This study revealed a mild stenosis just proximal to the indwelling left external iliac stent, but otherwise widely patent inflow, outflow, and runoff vessels.    Multiple follow up visits after this second procedure found the patient to be doing quite well but at follow up visits that Fall she had complaints of non-specific, vague abdominal pain concerning for re-stenosis of the SMA stent. She unfortunately suffered a true anaphylactic reaction from the iodinated contrast after her 10/11/21 CT. She went into shock which required hospitalization. She is no longer able to receive contrast for CT studies so she underwent stent evaluation with ultrasound imaging with findings suggestive of stent occlusion. During her 03/02/22 visit we discussed a repeat angiogram (with CO2) + IVUS versus watchful waiting. We made plans to follow up in one month but she developed worsening abdominal pain and was sent to the ED by GI and was found to have perforated appendicitis. While inpatient during that hospitalization I performed a repeat mesenteric angiogram, thrombectomy and drug-coated balloon angioplasty of the SMA 03/21/22. She was discharged home the next day and has reported minimal abdominal pain at all of our visits since then. She did have complaints about RLQ pain related to her appendix and she received an appendectomy 08/03/22. A repeat mesenteric duplex 08/15/22 showed increased velocities but patency of the SMA stents. She remains compliant with aspirin  and plavix .    She presents today via virtual telephone visit for follow up.  She has established care with Dr. Sammi Crick, and underwent colonoscopy several weeks ago with 3 sampled polyps which are benign.  She started Chantix  which has been successful, she is  no longer smoking.  She continues to have sporadic right lower quadrant pain which is occasional, sometimes after  eating, but not always.  She has gained weight.  No food fear.  Remains compliant with aspirin  and plavix .  No major bleeding issues.   She underwent a repeat mesenteric duplex study 05/14/23 and she followed up with me shortly thereafter for a video visit. She reported no change in her symptoms with an occasional twinge in her left lower abdomen. She denied food fear or post-prandial abdominal pain. She also endorsed continued leg pain at night. She reported compliance with aspirin  and plavix  but was still continuing to smoke. She was encouraged to pursue smoking cessation and we discussed a clinic visit in 6 months.   She recently followed up with Dr. Umberto Ganong 08/06/23 and reported a constant 4/10 abdominal pain that was intermittently waking her up at night. He ordered an MRA abdomen for further assessment. She also reported to him that she had not smoked in 3 months.   Her pain is constant and in the RLQ.  The pain is similar in character but not as severe as her prior LLQ pain that prompted SMA recanalization.  She has not noticed a post-prandial occurrence of her pain.  She has no food fear or weight loss. No GI bleeding.  She describes the pain as annoying, and more noticeable now that she is retired as of last week.   Past Medical History:  Diagnosis Date   Aortic regurgitation 07/20/2014   Aortic stenosis, severe 07/20/2014   Arthritis    DVT (deep venous thrombosis) (HCC)    Family history of adverse reaction to anesthesia    Reports father deliurm in his 88's with CABG   GERD (gastroesophageal reflux disease)    History of pneumonia    Hx of repair of rotator cuff    R and L rotator cuff repair   Hyperlipidemia    Hypertension    Insomnia, unspecified    patient stated d/t menopause   Mesenteric ischemia (HCC)    Muscle weakness (generalized)    Peripheral artery disease (HCC)    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   Redo  sternotomy for 21 mm Medtronic Freestyle porcine aortic root graft w/ reimplantation of left main and right coronary arteries   Sleep apnea    diagnosed multiple years ago at [facility] aortic stenosis, congenital - s/p repair during childhood     Past Surgical History:  Procedure Laterality Date   ABDOMINAL AORTAGRAM  06/24/2012   ABDOMINAL AORTAGRAM N/A 06/24/2012   Procedure: ABDOMINAL Tommi Fraise;  Surgeon: Dannis Dy, MD;  Location: [facility] CATH LAB;  Service: Cardiovascular;  Laterality: N/A;   AORTIC VALVE REPLACEMENT N/A 10/07/2014   Procedure: REDO AORTIC VALVE REPLACEMENT (AVR);  Surgeon: Gardenia Jump, MD;  Location: [facility] OR;  Service: Open Heart Surgery;  Laterality: N/A;   ASCENDING AORTIC ROOT REPLACEMENT N/A 10/07/2014   Procedure: ASCENDING AORTIC ROOT REPLACEMENT;  Surgeon: Gardenia Jump, MD;  Location: [facility] OR;  Service: Open Heart Surgery;  Laterality: N/A;   BIOPSY  09/27/2016   Procedure: BIOPSY;  Surgeon: Suzette Espy, MD;  Location: [facility];  Service: Endoscopy;;  colon   BIOPSY  10/18/2020   Procedure: BIOPSY;  Surgeon: Vinetta Greening, DO;  [facility]: [facility];  Service: Endoscopy;;   BREAST REDUCTION SURGERY Bilateral 01/21/2018   Procedure: BREAST REDUCTION WITH LIPOSUCTION;  Surgeon: Phyllis Breeze, MD;  Location: [facility];  Service: Plastics;  Laterality: Bilateral;   CARDIAC VALVE SURGERY  1968   CARPAL TUNNEL RELEASE Right 03/02/2020   Procedure: CARPAL TUNNEL RELEASE;  Surgeon: Darrin Emerald, MD;  Location: [facility] ORS;  Service: Orthopedics;  Laterality: Right;   CATARACT EXTRACTION W/PHACO Left 05/30/2019   Procedure: CATARACT EXTRACTION PHACO AND INTRAOCULAR LENS PLACEMENT (IOC) (CDE: 4.94  );  Surgeon: Tarri Farm, MD;  Location: [facility] ORS;  Service: Ophthalmology;  Laterality: Left;   CATARACT EXTRACTION W/PHACO Right 06/16/2019   Procedure: CATARACT EXTRACTION PHACO AND INTRAOCULAR LENS PLACEMENT  (IOC);  Surgeon: Tarri Farm, MD;  Location: [facility] ORS;  Service: Ophthalmology;  Laterality: Right;  CDE: 4.64   CERVICAL FUSION     CHOLECYSTECTOMY     COLONOSCOPY WITH PROPOFOL  N/A 09/27/2016   Dr. Riley Cheadle: Diverticulosis, several tubular adenomas removed ranging 4 to 7 mm in size, internal grade 1 hemorrhoids, terminal ileum normal, segmental biopsies negative for microscopic colitis.  Next colonoscopy June 2021   COLONOSCOPY WITH PROPOFOL  N/A 05/06/2020   internal hemorrhoids, sigmoid diverticulosis. Surveillance colonoscopy due in 2027   COLONOSCOPY WITH PROPOFOL  N/A 10/06/2022   Procedure: COLONOSCOPY WITH PROPOFOL ;  Surgeon: Urban Garden, MD;  [facility];  Service: Gastroenterology;  Laterality: N/A;  10:45AM;ASA 1   ESOPHAGOGASTRODUODENOSCOPY (EGD) WITH PROPOFOL  N/A 09/27/2016   Dr. Riley Cheadle: Small hiatal hernia, mild Schatzki ring status post disruption, LA grade a esophagitis   ESOPHAGOGASTRODUODENOSCOPY (EGD) WITH PROPOFOL  N/A 10/18/2020   non-obstructing mild Schatzki ring, gastritis s/ biopsy. Negative H.pylori.   HEMOSTASIS CLIP PLACEMENT  10/06/2022   Procedure: HEMOSTASIS CLIP PLACEMENT;  Surgeon: Urban Garden, MD;  [facility];  Service: Gastroenterology;;   ILIAC ARTERY STENT Left 12/2007   IR ANGIOGRAM EXTREMITY BILATERAL  07/12/2021   IR ANGIOGRAM VISCERAL SELECTIVE  10/29/2020   IR ANGIOGRAM VISCERAL SELECTIVE  07/12/2021   IR ANGIOGRAM VISCERAL SELECTIVE  03/21/2022   IR AORTAGRAM ABDOMINAL SERIALOGRAM  03/21/2022   IR IVUS EACH ADDITIONAL NON CORONARY VESSEL  07/12/2021   IR IVUS EACH ADDITIONAL NON CORONARY VESSEL  03/21/2022   IR PTA NON CORO-LOWER EXTREM  03/21/2022   IR RADIOLOGIST EVAL & MGMT  10/26/2020   IR RADIOLOGIST EVAL & MGMT  11/10/2020   IR RADIOLOGIST EVAL & MGMT  02/09/2021   IR RADIOLOGIST EVAL & MGMT  06/16/2021   IR RADIOLOGIST EVAL & MGMT  08/15/2021   IR RADIOLOGIST EVAL & MGMT  10/18/2021   IR  RADIOLOGIST EVAL & MGMT  01/11/2022   IR RADIOLOGIST EVAL & MGMT  03/02/2022   IR RADIOLOGIST EVAL & MGMT  05/11/2022   IR RADIOLOGIST EVAL & MGMT  08/18/2022   IR RADIOLOGIST EVAL & MGMT  10/18/2022   IR RADIOLOGIST EVAL & MGMT  05/21/2023   IR THROMBECT PRIM MECH INIT (INCLU) MOD SED  03/21/2022   IR THROMBECT SEC MECH MOD SED  07/12/2021   IR TRANSCATH PLC STENT 1ST ART NOT LE CV CAR VERT CAR  10/29/2020   IR TRANSCATH PLC STENT 1ST ART NOT LE CV CAR VERT CAR  07/12/2021   IR US  GUIDE VASC ACCESS RIGHT  10/29/2020   IR US  GUIDE VASC ACCESS RIGHT  07/12/2021   IR US  GUIDE VASC ACCESS RIGHT  03/21/2022   KNEE ARTHROSCOPY WITH MEDIAL MENISECTOMY Right 01/10/2018   Procedure: RIGHT KNEE ARTHROSCOPY WITH PARTIAL MEDIAL MENISECTOMY;  Surgeon: Darrin Emerald, MD;  Location: [facility];  Service: Orthopedics;  Laterality: Right;   LAPAROSCOPIC APPENDECTOMY N/A 08/03/2022   Procedure: APPENDECTOMY LAPAROSCOPIC;  Surgeon: Shela Derby, MD;  Location: [facility] OR;  Service: General;  Laterality: N/A;   LEFT AND RIGHT HEART CATHETERIZATION WITH CORONARY ANGIOGRAM N/A 07/31/2014   Procedure: LEFT AND RIGHT HEART CATHETERIZATION WITH CORONARY ANGIOGRAM;  Surgeon: Odie Benne, MD;  Location: [facility] CATH LAB;  Service: Cardiovascular;  Laterality: N/A;   MALONEY DILATION N/A 09/27/2016   Procedure: Londa Rival DILATION;  Surgeon: Suzette Espy, MD;  [facility];  Service: Endoscopy;  Laterality: N/A;   POLYPECTOMY  09/27/2016   Procedure: POLYPECTOMY;  Surgeon: Suzette Espy, MD;  [facility];  Service: Endoscopy;;  colon   POLYPECTOMY  10/06/2022   Procedure: POLYPECTOMY;  Surgeon: Urban Garden, MD;  [facility];  Service: Gastroenterology;;   ROTATOR CUFF REPAIR Bilateral    SUBMUCOSAL LIFTING INJECTION  10/06/2022   Procedure: SUBMUCOSAL LIFTING INJECTION;  Surgeon: Urban Garden, MD;  [facility];  Service: Gastroenterology;;   TEE  WITHOUT CARDIOVERSION N/A 07/07/2014   Procedure: TRANSESOPHAGEAL ECHOCARDIOGRAM (TEE);  Surgeon: Laurann Pollock, MD;  [facility];  Service: Cardiology;  Laterality: N/A;   TEE WITHOUT CARDIOVERSION N/A 07/20/2014   Procedure: TRANSESOPHAGEAL ECHOCARDIOGRAM (TEE) WITH PROPOFOL ;  Surgeon: Laurann Pollock, MD;  Location: [facility];  Service: Endoscopy;  Laterality: N/A;   TEE WITHOUT CARDIOVERSION N/A 10/07/2014   Procedure: TRANSESOPHAGEAL ECHOCARDIOGRAM (TEE);  Surgeon: Gardenia Jump, MD;  Location: [facility] OR;  Service: Open Heart Surgery;  Laterality: N/A;    Allergies: Iodinated contrast media and Sulfa antibiotics  Medications: Prior to Admission medications   Medication Sig Start Date End Date Taking? Authorizing Provider  acetaminophen  (TYLENOL ) 325 MG tablet Take 650 mg by mouth every 6 (six) hours as needed for mild pain, moderate pain, fever or headache.    [provider]  aspirin  EC 81 MG tablet Take 1 tablet (81 mg total) by mouth daily. Swallow whole. 12/16/19   Laurann Pollock, MD  Cholecalciferol (VITAMIN D-3 PO) Take 1 capsule by mouth in the morning and at bedtime.    [provider]  clopidogrel  (PLAVIX ) 75 MG tablet Take 1 tablet (75 mg total) by mouth daily. 02/09/23   Matthews, Kacie Sue-Ellen, PA  dicyclomine  (BENTYL ) 10 MG capsule Take 1 capsule (10 mg total) by mouth every 12 (twelve) hours as needed (abdominal pain). 08/06/23   Urban Garden, MD  estradiol  (ESTRACE ) 2 MG tablet Take 1 tablet (2 mg total) by mouth daily. 01/19/21   Benjiman Bras, MD  ezetimibe  (ZETIA ) 10 MG tablet TAKE 1 TABLET BY MOUTH DAILY 12/05/22   Laurann Pollock, MD  Flaxseed, Linseed, (FLAX SEED OIL PO) Take 1 capsule by mouth in the morning.    [provider]  fluticasone  (FLONASE ) 50 MCG/ACT nasal spray Place 1 spray into both nostrils 2 (two) times daily. 12/07/22   Corbin Dess, PA-C  hydrocortisone  (ANUSOL -HC) 2.5 % rectal  cream Place 1 Application rectally 2 (two) times daily. As needed for rectal bleeding. Patient taking differently: Place 1 Application rectally 2 (two) times daily as needed for hemorrhoids (rectal bleeding). 12/06/21   Delman Ferns, NP  losartan  (COZAAR ) 25 MG tablet TAKE 1 TABLET BY MOUTH DAILY 02/26/23   Greene, Jeffrey R, MD  metoprolol  tartrate (LOPRESSOR ) 25 MG tablet TAKE 1 TABLET BY MOUTH TWICE  DAILY 01/08/23   Laurann Pollock,  MD  Multiple Minerals (CALCIUM -MAGNESIUM -ZINC) TABS Take 1 tablet by mouth in the morning.    [provider]  NP THYROID  120 MG tablet Take 120 mg by mouth daily before breakfast. 01/11/22   [provider]  progesterone  (PROMETRIUM ) 200 MG capsule Take 2 capsules (400 mg total) by mouth daily. Patient taking differently: Take 400 mg by mouth at bedtime. 01/19/21 10/03/23  Benjiman Bras, MD  RABEprazole  (ACIPHEX ) 20 MG tablet Take 1 tablet (20 mg total) by mouth 2 (two) times daily before a meal. 03/12/23   Delman Ferns, NP  rosuvastatin  (CRESTOR ) 40 MG tablet TAKE 1 TABLET BY MOUTH DAILY 10/09/22   Laurann Pollock, MD  varenicline  (CHANTIX ) 1 MG tablet Take 1 tablet (1 mg total) by mouth 2 (two) times daily. 08/20/23   Urban Garden, MD     Family History  Problem Relation Age of Onset   Hypertension Mother    Hypertension Father    Heart disease Father        before age 26   Other Father        varicose veins   COPD Father    Colon cancer Neg Hx    Celiac disease Neg Hx    Inflammatory bowel disease Neg Hx     Social History   Socioeconomic History   Marital status: Married    Spouse name: Raschie   Number of children: 0   Years of education: some coll   Highest education level: Some college, no degree  Occupational History   Occupation: Financial planner: [employer]    Comment: [employer]  Tobacco Use   Smoking status: Former    Current packs/day: 0.00    Average packs/day: 0.5 packs/day for 40.0 years (20.0  ttl pk-yrs)    Types: Cigarettes    Start date: 05/1981    Quit date: 05/2021    Years since quitting: 2.2   Smokeless tobacco: Never  Vaping Use   Vaping status: Never Used  Substance and Sexual Activity   Alcohol use: Yes    Alcohol/week: 2.0 standard drinks of alcohol    Types: 2 Cans of beer per week    Comment: weekly   Drug use: No   Sexual activity: Yes    Birth control/protection: None  Other Topics Concern   Not on file  Social History Narrative   Patient is married   for 5 years . Patient works at [employer].. 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   Social Drivers of Corporate investment banker Strain: Low Risk  (04/16/2023)   Overall Financial Resource Strain (CARDIA)    Difficulty of Paying Living Expenses: Not hard at all  Food Insecurity: No Food Insecurity (04/16/2023)   Hunger Vital Sign    Worried About Running Out of Food in the Last Year: Never true    Ran Out of Food in the Last Year: Never true  Transportation Needs: No Transportation Needs (04/16/2023)   PRAPARE - Administrator, Civil Service (Medical): No    Lack of Transportation (Non-Medical): No  Physical Activity: Insufficiently Active (04/16/2023)   Exercise Vital Sign    Days of Exercise per Week: 2 days    Minutes of Exercise per Session: 30 min  Stress: Stress Concern Present (04/16/2023)   [employer] of Occupational Health - Occupational Stress Questionnaire    Feeling of Stress : To some extent  Social Connections: Socially Isolated (  04/16/2023)   Social Connection and Isolation Panel [NHANES]    Frequency of Communication with Friends and Family: Once a week    Frequency of Social Gatherings with Friends and Family: Once a week    Attends Religious Services: Never    Database administrator or Organizations: No    Attends Engineer, structural: Not on file    Marital Status: Married     Vital Signs: There  were no vitals taken for this visit.  Physical Exam Patient is alert, oriented and able to participate fully in the conversation. No apparent discomfort or distress observed. She appears appropriately dressed.    Imaging: ABI (06/03/21--> 11/25/21) R = 1.11 --> 1.07 L = 0.93 --> 1.02     CTA AP (06/09/21)  Interval occlusion of SMA stent, with proximal native reconstitution.  Prominent Arc of Riolan.  Unchanged mild/moderate celiac stenosis, widely patent IMA.     CTA AP (07/15/21)  Recanalized and relined SMA stent, widely patent.   Angiogram (07/12/21)  Mild proximal left EIA stenosis and mild in-stent restenosis.   10/11/21  Patent SMA stent   US  Carotid 01/13/22 IMPRESSION: Color duplex indicates minimal heterogeneous plaque, with no hemodynamically significant stenosis by duplex criteria in the extracranial cerebrovascular circulation.    US  Mesenteric 01/13/22 MPRESSION: Directed duplex of the mesenteric vasculature demonstrates patent stent with questionable mid stent stenosis versus artifact.  Celiac artery not evaluated given the poor sonographic window.   US  Mesenteric 04/21/22 IMPRESSION: 1. Elevated peak systolic velocity in the proximal and mid aspect of the SMA may reflect turbulence related to the presence of the stent. 2. The stent remains patent. 3. Celiac axis is largely obscured by bowel gas.    US  Mesenteric 08/15/22 IMPRESSION: Increasing peak systolic velocity throughout the stented segment of the SMA concerning for possible recurrent in stent stenosis. The vascular channel was not particularly well visualized in stenosis cannot be visually confirmed.    US  Mesenteric 05/14/23 SMA: Proximal 199 cm/sec. Mid 186 cm/sec. Distal 375 cm/sec. Previously 306, 389 and 361 cm/s respectively.  IMPRESSION: Persistently elevated velocities within the SMA stent, and weak and delayed Doppler waveform distal to the stent. Findings are suspicious for in/stent  stenosis. If continued clinical concern, consider CTA abdomen for further evaluation.  MRA abdomen 08/18/23  IMPRESSION: 1. Roughly 50% proximal stenosis of the celiac axis. 2. Degree of patency of the previously placed proximal SMA stent cannot be evaluated by MRA due to stent associated artifact. There is flow seen beyond the stent into the SMA trunk and branch vessels.    Labs:  CBC: Recent Labs    04/19/23 1623  WBC 8.6  HGB 13.0  HCT 39.6  PLT 292.0    COAGS: No results for input(s): "INR", "APTT" in the last 8760 hours.  BMP: Recent Labs    04/19/23 1623  NA 139  K 4.3  CL 105  CO2 25  GLUCOSE 88  BUN 14  CALCIUM  9.2  CREATININE 0.93    LIVER FUNCTION TESTS: Recent Labs    04/19/23 1623  BILITOT 0.3  AST 19  ALT 15  ALKPHOS 57  PROT 7.2  ALBUMIN  4.2    Assessment and Plan: 68 year old female with history of atherosclerotic disease including peripheral artery disease and chronic mesenteric ischemia now status post superior mesenteric artery covered stent placement on 10/29/20 and stent recanalization with re-lining on 07/12/21. She was hospitalized November 2023 for acute appendicitis with thrombosed SMA stent. She underwent  mesenteric angiogram (CO2 + IVUS) and SMA thrombectomy with DCB angioplasty of the previously placed stents on 03/21/22. Imaging is now difficult to monitor patency of the stent due to severe anaphylaxis with iodinated contrast material.  MRA recently showed distal SMA patency beyond the stent.  As prior, there is no way to truly assess her stent except endovascularly with CO2 and IVUS.  She is not ready to pursue repeat endovascular assessment and possible stent revision at this time.  She will let our office know if her pain worsens to the point that she wants to pursue additional intervention.   Creasie Doctor, MD Pager: [phone]    I spent a total of 25 Minutes in virtual video clinical consultation, greater than 50% of which  was counseling/coordinating care for chronic mesenteric ischemia.

## 2023-08-29 ENCOUNTER — Ambulatory Visit
Admission: RE | Admit: 2023-08-29 | Discharge: 2023-08-29 | Disposition: A | Discharge status: Home / Self Care | Source: Ambulatory Visit | Attending: Gastroenterology | Admitting: Gastroenterology

## 2023-08-29 DIAGNOSIS — K559 Vascular disorder of intestine, unspecified: Secondary | ICD-10-CM

## 2023-08-29 HISTORY — PX: IR RADIOLOGIST EVAL & MGMT: IMG5224

## 2023-08-31 ENCOUNTER — Encounter (INDEPENDENT_AMBULATORY_CARE_PROVIDER_SITE_OTHER): Payer: Self-pay | Admitting: *Deleted

## 2023-09-03 ENCOUNTER — Institutional Professional Consult (permissible substitution): Payer: Medicare Other | Admitting: Primary Care

## 2023-09-03 ENCOUNTER — Ambulatory Visit: Payer: Medicare Other | Admitting: Primary Care

## 2023-09-03 ENCOUNTER — Encounter: Payer: Self-pay | Admitting: Primary Care

## 2023-09-10 ENCOUNTER — Other Ambulatory Visit (HOSPITAL_COMMUNITY)
Admission: RE | Admit: 2023-09-10 | Discharge: 2023-09-10 | Disposition: A | Discharge status: Home / Self Care | Source: Ambulatory Visit | Attending: Cardiology | Admitting: Cardiology

## 2023-09-10 DIAGNOSIS — E782 Mixed hyperlipidemia: Secondary | ICD-10-CM | POA: Insufficient documentation

## 2023-09-10 DIAGNOSIS — Z79899 Other long term (current) drug therapy: Secondary | ICD-10-CM | POA: Insufficient documentation

## 2023-09-10 LAB — LIPID PANEL
Cholesterol: 123 mg/dL (ref 0–200)
HDL: 54 mg/dL (ref >40)
LDL Cholesterol: 38 mg/dL (ref 0–99)
Total CHOL/HDL Ratio: 2.3 ratio
Triglycerides: 153 mg/dL — ABNORMAL HIGH (ref <150)
VLDL: 31 mg/dL (ref 0–40)

## 2023-09-19 ENCOUNTER — Ambulatory Visit (INDEPENDENT_AMBULATORY_CARE_PROVIDER_SITE_OTHER): Admitting: Gastroenterology

## 2023-09-19 NOTE — Progress Notes (Signed)
 MR angio abdomen ordered to rule out recurring chronic mesenteric ischemia and possible in stent thrombosis . Patient has contraindication for CT as she had anaphylaxis to iodinated contrast.

## 2023-09-20 ENCOUNTER — Encounter (INDEPENDENT_AMBULATORY_CARE_PROVIDER_SITE_OTHER): Payer: Self-pay | Admitting: Gastroenterology

## 2023-09-20 ENCOUNTER — Ambulatory Visit (INDEPENDENT_AMBULATORY_CARE_PROVIDER_SITE_OTHER): Admitting: Gastroenterology

## 2023-09-20 ENCOUNTER — Telehealth: Payer: Self-pay | Admitting: *Deleted

## 2023-09-20 VITALS — BP 123/77 | HR 71 | Temp 97.1°F | Ht 66.0 in | Wt 237.1 lb

## 2023-09-20 DIAGNOSIS — K551 Chronic vascular disorders of intestine: Secondary | ICD-10-CM

## 2023-09-20 DIAGNOSIS — R109 Unspecified abdominal pain: Secondary | ICD-10-CM

## 2023-09-20 DIAGNOSIS — Z860100 Personal history of colon polyps, unspecified: Secondary | ICD-10-CM

## 2023-09-20 DIAGNOSIS — G8929 Other chronic pain: Secondary | ICD-10-CM | POA: Diagnosis not present

## 2023-09-20 NOTE — Telephone Encounter (Signed)
  Request for patient to stop medication prior to procedure or is needing cleareance  09/20/23  Michelle Patton Jul 06, 1955  What type of surgery is being performed? Colonoscopy  When is surgery scheduled? TBD  What type of clearance is required (medical or pharmacy to hold medication or both? medication  Are there any medications that need to be held prior to surgery and how long? Plavix  x 5 days  Name of physician performing surgery?  Kayleen [facility] Phone: [phone] Fax: [phone]  Anethesia type (none, local, MAC, general)? MAC

## 2023-09-20 NOTE — Patient Instructions (Signed)
 Schedule  colonoscopy Continue with smoking avoidance Please reach Dr. Jinx Mourning if presenting worsening abdominal pain and decided to proceed with endovascular evaluation of stent patency

## 2023-09-20 NOTE — Progress Notes (Signed)
 Samantha Cress, M.D. Gastroenterology & Hepatology [facility] Gastroenterology [address] [place], [place] [address]  Primary Care Physician: Benjiman Bras, MD [phone] [address] [place] [address]  I will communicate my assessment and recommendations to the referring MD via EMR.  Problems: Chronic mesenteric ischemia status post stent placement on 10/29/2020 complicated by recurrent thrombosis status post thrombus aspiration, SMA stent recanalization and balloon angioplasty Recurrent abdominal pain.  History of Present Illness: Michelle Patton is a 68 y.o. female with past medical history of CAD, history of chronic abdominal pain secondary to chronic mesenteric ischemia status post stent placement on 10/29/2020 complicated by recurrent thrombosis status post SMA stent recanalization and balloon angioplasty, severe aortic stenosis, DVT, GERD, hyperlipidemia, hypertension, peripheral arterial disease, sleep apnea, who presents for follow up of chronic mesenteric ischemia and abdominal pain.   The patient was last seen on 08/06/2023. At that time, the patient was ordered to undergo an MR angio of the abdomen with IV contrast.  She was given a prescription for Bentyl  as needed for abdominal pain.  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  There was significant artifact at the stent site which affected visualization of the patency.  As she had history of anaphylactic reaction to contrast, patient was advised to follow-up with interventional radiology closely.  Patient saw Dr. Jinx Mourning on 08/29/2023.  It was discussed that the best way to evaluate the patency of the stent will be with endovascular examination with CO2 and IVUS .  However, the patient did not feel ready to pursue evaluation and wanted to hold off on this for now.  Patient reports that she has felt the pain gets some  better with Levsin. She has not identified a clear trigger for the pain but has not paid attention to what food or actions may trigger the pain. She is having the pain daily but is not happening all the time. Most of the time she has the pain in the right side but sometimes moves to the left side.  The patient denies having any nausea, vomiting, fever, chills, hematochezia, melena, hematemesis,  diarrhea, jaundice, pruritus or weight loss.  Has not smoked in 5 months.   Last EGD:09/2020 Nonobstructing Schatzki's ring, gastritis, some falls had pallor.  Normal duodenum.  Biopsies negative for H. pylori.   Last Colonoscopy: 10/06/2022 - One 12 mm polyp in the cecum, removed with a cold snare. Resected and retrieved via EMR. Clips were placed. Clip manufacturer: [employer]. - One 3 mm polyp in the ascending colon, removed with a cold snare. Resected and retrieved. - One 3 mm polyp in the transverse colon, removed with a cold snare. Resected and retrieved via EMR. - Diverticulosis in the sigmoid colon and in the descending colon. - Non- bleeding internal hemorrhoids.   Pathology showed 3 SSLs, repeat colonoscopy in 1 year due to piecemeal polypectomy.   Past Medical History: Past Medical History:  Diagnosis Date   Aortic regurgitation 07/20/2014   Aortic stenosis, severe 07/20/2014   Arthritis    DVT (deep venous thrombosis) (HCC)    Family history of adverse reaction to anesthesia    Reports father deliurm in his 51's with CABG   GERD (gastroesophageal reflux disease)    History of pneumonia    Hx of repair of rotator cuff    R and L rotator cuff repair   Hyperlipidemia  Hypertension    Insomnia, unspecified    patient stated d/t menopause   Mesenteric ischemia (HCC)    Muscle weakness (generalized)    Peripheral artery disease (HCC)    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   Redo sternotomy for 21 mm Medtronic Freestyle porcine  aortic root graft w/ reimplantation of left main and right coronary arteries   Sleep apnea    diagnosed multiple years ago at [facility] aortic stenosis, congenital - s/p repair during childhood     Past Surgical History: Past Surgical History:  Procedure Laterality Date   ABDOMINAL AORTAGRAM  06/24/2012   ABDOMINAL AORTAGRAM N/A 06/24/2012   Procedure: ABDOMINAL Tommi Fraise;  Surgeon: Dannis Dy, MD;  Location: [facility] CATH LAB;  Service: Cardiovascular;  Laterality: N/A;   AORTIC VALVE REPLACEMENT N/A 10/07/2014   Procedure: REDO AORTIC VALVE REPLACEMENT (AVR);  Surgeon: Gardenia Jump, MD;  Location: [facility] OR;  Service: Open Heart Surgery;  Laterality: N/A;   ASCENDING AORTIC ROOT REPLACEMENT N/A 10/07/2014   Procedure: ASCENDING AORTIC ROOT REPLACEMENT;  Surgeon: Gardenia Jump, MD;  Location: [facility] OR;  Service: Open Heart Surgery;  Laterality: N/A;   BIOPSY  09/27/2016   Procedure: BIOPSY;  Surgeon: Suzette Espy, MD;  [facility];  Service: Endoscopy;;  colon   BIOPSY  10/18/2020   Procedure: BIOPSY;  Surgeon: Vinetta Greening, DO;  [facility];  Service: Endoscopy;;   BREAST REDUCTION SURGERY Bilateral 01/21/2018   Procedure: BREAST REDUCTION WITH LIPOSUCTION;  Surgeon: Phyllis Breeze, MD;  Location: [facility];  Service: Plastics;  Laterality: Bilateral;   CARDIAC VALVE SURGERY  1968   CARPAL TUNNEL RELEASE Right 03/02/2020   Procedure: CARPAL TUNNEL RELEASE;  Surgeon: Darrin Emerald, MD;  Location: [facility] ORS;  Service: Orthopedics;  Laterality: Right;   CATARACT EXTRACTION W/PHACO Left 05/30/2019   Procedure: CATARACT EXTRACTION PHACO AND INTRAOCULAR LENS PLACEMENT (IOC) (CDE: 4.94  );  Surgeon: Tarri Farm, MD;  Location: [facility];  Service: Ophthalmology;  Laterality: Left;   CATARACT EXTRACTION W/PHACO Right 06/16/2019   Procedure: CATARACT EXTRACTION PHACO AND INTRAOCULAR LENS PLACEMENT (IOC);  Surgeon: Tarri Farm, MD;  Location: [facility];  Service: Ophthalmology;  Laterality: Right;  CDE: 4.64   CERVICAL FUSION     CHOLECYSTECTOMY     COLONOSCOPY WITH PROPOFOL  N/A 09/27/2016   Dr. Riley Cheadle: Diverticulosis, several tubular adenomas removed ranging 4 to 7 mm in size, internal grade 1 hemorrhoids, terminal ileum normal, segmental biopsies negative for microscopic colitis.  Next colonoscopy June 2021   COLONOSCOPY WITH PROPOFOL  N/A 05/06/2020   internal hemorrhoids, sigmoid diverticulosis. Surveillance colonoscopy due in 2027   COLONOSCOPY WITH PROPOFOL  N/A 10/06/2022   Procedure: COLONOSCOPY WITH PROPOFOL ;  Surgeon: Urban Garden, MD;  [facility];  Service: Gastroenterology;  Laterality: N/A;  10:45AM;ASA 1   ESOPHAGOGASTRODUODENOSCOPY (EGD) WITH PROPOFOL  N/A 09/27/2016   Dr. Riley Cheadle: Small hiatal hernia, mild Schatzki ring status post disruption, LA grade a esophagitis   ESOPHAGOGASTRODUODENOSCOPY (EGD) WITH PROPOFOL  N/A 10/18/2020   non-obstructing mild Schatzki ring, gastritis s/ biopsy. Negative H.pylori.   HEMOSTASIS CLIP PLACEMENT  10/06/2022   Procedure: HEMOSTASIS CLIP PLACEMENT;  Surgeon: Urban Garden, MD;  [facility];  Service: Gastroenterology;;   ILIAC ARTERY STENT Left 12/2007   IR ANGIOGRAM EXTREMITY BILATERAL  07/12/2021   IR ANGIOGRAM VISCERAL SELECTIVE  10/29/2020   IR ANGIOGRAM VISCERAL SELECTIVE  07/12/2021   IR ANGIOGRAM VISCERAL SELECTIVE  03/21/2022   IR AORTAGRAM ABDOMINAL SERIALOGRAM  03/21/2022   IR IVUS EACH ADDITIONAL NON CORONARY VESSEL  07/12/2021   IR IVUS EACH ADDITIONAL NON CORONARY VESSEL  03/21/2022   IR PTA NON CORO-LOWER EXTREM  03/21/2022   IR RADIOLOGIST EVAL & MGMT  10/26/2020   IR RADIOLOGIST EVAL & MGMT  11/10/2020   IR RADIOLOGIST EVAL & MGMT  02/09/2021   IR RADIOLOGIST EVAL & MGMT  06/16/2021   IR RADIOLOGIST EVAL & MGMT  08/15/2021   IR RADIOLOGIST EVAL & MGMT  10/18/2021   IR RADIOLOGIST EVAL & MGMT  01/11/2022    IR RADIOLOGIST EVAL & MGMT  03/02/2022   IR RADIOLOGIST EVAL & MGMT  05/11/2022   IR RADIOLOGIST EVAL & MGMT  08/18/2022   IR RADIOLOGIST EVAL & MGMT  10/18/2022   IR RADIOLOGIST EVAL & MGMT  05/21/2023   IR RADIOLOGIST EVAL & MGMT  08/29/2023   IR THROMBECT PRIM MECH INIT (INCLU) MOD SED  03/21/2022   IR THROMBECT SEC MECH MOD SED  07/12/2021   IR TRANSCATH PLC STENT 1ST ART NOT LE CV CAR VERT CAR  10/29/2020   IR TRANSCATH PLC STENT 1ST ART NOT LE CV CAR VERT CAR  07/12/2021   IR US  GUIDE VASC ACCESS RIGHT  10/29/2020   IR US  GUIDE VASC ACCESS RIGHT  07/12/2021   IR US  GUIDE VASC ACCESS RIGHT  03/21/2022   KNEE ARTHROSCOPY WITH MEDIAL MENISECTOMY Right 01/10/2018   Procedure: RIGHT KNEE ARTHROSCOPY WITH PARTIAL MEDIAL MENISECTOMY;  Surgeon: Darrin Emerald, MD;  Location: [facility];  Service: Orthopedics;  Laterality: Right;   LAPAROSCOPIC APPENDECTOMY N/A 08/03/2022   Procedure: APPENDECTOMY LAPAROSCOPIC;  Surgeon: Shela Derby, MD;  Location: [facility] OR;  Service: General;  Laterality: N/A;   LEFT AND RIGHT HEART CATHETERIZATION WITH CORONARY ANGIOGRAM N/A 07/31/2014   Procedure: LEFT AND RIGHT HEART CATHETERIZATION WITH CORONARY ANGIOGRAM;  Surgeon: Odie Benne, MD;  Location: [facility] CATH LAB;  Service: Cardiovascular;  Laterality: N/A;   MALONEY DILATION N/A 09/27/2016   Procedure: Londa Rival DILATION;  Surgeon: Suzette Espy, MD;  [facility];  Service: Endoscopy;  Laterality: N/A;   POLYPECTOMY  09/27/2016   Procedure: POLYPECTOMY;  Surgeon: Suzette Espy, MD;  [facility];  Service: Endoscopy;;  colon   POLYPECTOMY  10/06/2022   Procedure: POLYPECTOMY;  Surgeon: Urban Garden, MD;  [facility];  Service: Gastroenterology;;   ROTATOR CUFF REPAIR Bilateral    SUBMUCOSAL LIFTING INJECTION  10/06/2022   Procedure: SUBMUCOSAL LIFTING INJECTION;  Surgeon: Urban Garden, MD;  [facility];  Service: Gastroenterology;;    TEE WITHOUT CARDIOVERSION N/A 07/07/2014   Procedure: TRANSESOPHAGEAL ECHOCARDIOGRAM (TEE);  Surgeon: Laurann Pollock, MD;  [facility];  Service: Cardiology;  Laterality: N/A;   TEE WITHOUT CARDIOVERSION N/A 07/20/2014   Procedure: TRANSESOPHAGEAL ECHOCARDIOGRAM (TEE) WITH PROPOFOL ;  Surgeon: Laurann Pollock, MD;  Location[facility];  Service: Endoscopy;  Laterality: N/A;   TEE WITHOUT CARDIOVERSION N/A 10/07/2014   Procedure: TRANSESOPHAGEAL ECHOCARDIOGRAM (TEE);  Surgeon: Gardenia Jump, MD;  Location: [facility] OR;  Service: Open Heart Surgery;  Laterality: N/A;    Family History: Family History  Problem Relation Age of Onset   Hypertension Mother    Hypertension Father    Heart disease Father        before age 67   Other Father  varicose veins   COPD Father    Colon cancer Neg Hx    Celiac disease Neg Hx    Inflammatory bowel disease Neg Hx     Social History: Social History   Tobacco Use  Smoking Status Former   Current packs/day: 0.00   Average packs/day: 0.5 packs/day for 40.0 years (20.0 ttl pk-yrs)   Types: Cigarettes   Start date: 05/1981   Quit date: 05/2021   Years since quitting: 2.3  Smokeless Tobacco Never   Social History   Substance and Sexual Activity  Alcohol Use Yes   Alcohol/week: 2.0 standard drinks of alcohol   Types: 2 Cans of beer per week   Comment: weekly   Social History   Substance and Sexual Activity  Drug Use No    Allergies: Allergies  Allergen Reactions   Iodinated Contrast Media Anaphylaxis, Shortness Of Breath, Itching, Swelling and Other (See Comments)    Patient was given Omni 350 and suffered from itching, chest pain and shortness of breath. Patient was taken to ED after onset of reaction. 10/11/2021  MD zackowski noted pt had tongue and throat swelling, had to give pt epinephrine     Sulfa Antibiotics Nausea And Vomiting    Medications: Current Outpatient Medications  Medication Sig Dispense Refill    acetaminophen  (TYLENOL ) 325 MG tablet Take 650 mg by mouth every 6 (six) hours as needed for mild pain, moderate pain, fever or headache.     aspirin  EC 81 MG tablet Take 1 tablet (81 mg total) by mouth daily. Swallow whole. 90 tablet 3   Cholecalciferol (VITAMIN D-3 PO) Take 1 capsule by mouth in the morning and at bedtime.     clopidogrel  (PLAVIX ) 75 MG tablet Take 1 tablet (75 mg total) by mouth daily. 90 tablet 3   dicyclomine  (BENTYL ) 10 MG capsule Take 1 capsule (10 mg total) by mouth every 12 (twelve) hours as needed (abdominal pain). 180 capsule 0   estradiol  (ESTRACE ) 2 MG tablet Take 1 tablet (2 mg total) by mouth daily. 30 tablet 3   ezetimibe  (ZETIA ) 10 MG tablet TAKE 1 TABLET BY MOUTH DAILY 90 tablet 3   Flaxseed, Linseed, (FLAX SEED OIL PO) Take 1 capsule by mouth in the morning.     fluticasone  (FLONASE ) 50 MCG/ACT nasal spray Place 1 spray into both nostrils 2 (two) times daily. 16 g 2   losartan  (COZAAR ) 25 MG tablet TAKE 1 TABLET BY MOUTH DAILY 90 tablet 3   metoprolol  tartrate (LOPRESSOR ) 25 MG tablet TAKE 1 TABLET BY MOUTH TWICE  DAILY 180 tablet 3   Multiple Minerals (CALCIUM -MAGNESIUM -ZINC) TABS Take 1 tablet by mouth in the morning.     NP THYROID  120 MG tablet Take 120 mg by mouth daily before breakfast.     progesterone  (PROMETRIUM ) 200 MG capsule Take 2 capsules (400 mg total) by mouth daily. (Patient taking differently: Take 400 mg by mouth at bedtime.) 180 capsule 1   RABEprazole  (ACIPHEX ) 20 MG tablet Take 1 tablet (20 mg total) by mouth 2 (two) times daily before a meal. 180 tablet 3   rosuvastatin  (CRESTOR ) 40 MG tablet TAKE 1 TABLET BY MOUTH DAILY 90 tablet 3   varenicline  (CHANTIX ) 1 MG tablet Take 1 tablet (1 mg total) by mouth 2 (two) times daily. 180 tablet 3   No current facility-administered medications for this visit.    Review of Systems: GENERAL: negative for malaise, night sweats HEENT: No changes in hearing or vision, no nose bleeds or  other nasal  problems. NECK: Negative for lumps, goiter, pain and significant neck swelling RESPIRATORY: Negative for cough, wheezing CARDIOVASCULAR: Negative for chest pain, leg swelling, palpitations, orthopnea GI: SEE HPI MUSCULOSKELETAL: Negative for joint pain or swelling, back pain, and muscle pain. SKIN: Negative for lesions, rash PSYCH: Negative for sleep disturbance, mood disorder and recent psychosocial stressors. HEMATOLOGY Negative for prolonged bleeding, bruising easily, and swollen nodes. ENDOCRINE: Negative for cold or heat intolerance, polyuria, polydipsia and goiter. NEURO: negative for tremor, gait imbalance, syncope and seizures. The remainder of the review of systems is noncontributory.   Physical Exam: BP 123/77 (BP Location: Left Arm, Patient Position: Sitting, Cuff Size: Large)   Pulse 71   Temp (!) 97.1 F (36.2 C) (Temporal)   Ht 5\' 6"  (1.676 m)   Wt 237 lb 1.6 oz (107.5 kg)   BMI 38.27 kg/m  GENERAL: The patient is AO x3, in no acute distress. HEENT: Head is normocephalic and atraumatic. EOMI are intact. Mouth is well hydrated and without lesions. NECK: Supple. No masses LUNGS: Clear to auscultation. No presence of rhonchi/wheezing/rales. Adequate chest expansion HEART: RRR, normal s1 and s2. ABDOMEN: Mildly tender upon palpation of the right lower quadrant and left lower quadrant, no guarding, no peritoneal signs, and nondistended. BS +. No masses. EXTREMITIES: Without any cyanosis, clubbing, rash, lesions or edema. NEUROLOGIC: AOx3, no focal motor deficit. SKIN: no jaundice, no rashes  Imaging/Labs: as above  I personally reviewed and interpreted the available labs, imaging and endoscopic files.  Impression and Plan: Michelle Patton is a 68 y.o. female with past medical history of CAD, history of chronic abdominal pain secondary to chronic mesenteric ischemia status post stent placement on 10/29/2020 complicated by recurrent thrombosis status post SMA stent  recanalization and balloon angioplasty, severe aortic stenosis, DVT, GERD, hyperlipidemia, hypertension, peripheral arterial disease, sleep apnea, who presents for follow up of chronic mesenteric ischemia and abdominal pain.   Patient has presented persistent abdominal pain, which she has been able to manage with the use of Levsin.  She does not feel ready to have an invasive evaluation of her vasculature with interventional radiology and would like to hold off on this for now.  We discussed that it will be important to discuss directly with Dr. Jinx Mourning if she wants to proceed with this or if she presents worsening abdominal pain, as an intervention to evaluate her stent may be warranted.  She understood and agreed.  She will need to continue working on smoking avoidance.  I congratulated her on this.  Finally, she is due for repeat colonoscopy given piecemeal resection of her polyps in the past.  Will schedule this today.  -Schedule  colonoscopy -Continue Levsin as needed for abdominal pain -Continue with smoking avoidance - The patient should reach Dr. Jinx Mourning if presenting worsening abdominal pain and/or decides to proceed with endovascular evaluation of stent patency  All questions were answered.      Samantha Cress, MD Gastroenterology and Hepatology [facility] Gastroenterology

## 2023-09-26 ENCOUNTER — Other Ambulatory Visit: Payer: Self-pay | Admitting: Cardiology

## 2023-10-03 NOTE — Telephone Encounter (Signed)
Update on clearance.

## 2023-10-09 ENCOUNTER — Other Ambulatory Visit: Payer: Self-pay | Admitting: Student

## 2023-10-09 DIAGNOSIS — K551 Chronic vascular disorders of intestine: Secondary | ICD-10-CM

## 2023-10-09 DIAGNOSIS — I739 Peripheral vascular disease, unspecified: Secondary | ICD-10-CM

## 2023-10-09 MED ORDER — CLOPIDOGREL BISULFATE 75 MG PO TABS: 75.0000 mg | ORAL_TABLET | Freq: Every day | ORAL | 3 refills | Status: DC

## 2023-10-09 NOTE — Telephone Encounter (Signed)
 Plavix  75mg  PO daily sent to [facility] with 3 refills.  Rhoda Waldvogel, MS RD PA-C

## 2023-10-15 ENCOUNTER — Telehealth: Payer: Self-pay | Admitting: Interventional Radiology

## 2023-10-15 NOTE — Telephone Encounter (Signed)
 Thanks, please schedule her colonoscopy, please give directions regarding Plavix  to be held for 5 days.

## 2023-10-15 NOTE — Telephone Encounter (Signed)
 Tammy at Dr Samuel office called needing clearance for Plavix  for a colonoscopy. Dr Jennefer agreed to hold.  (13:26) jsanders: Good Afternoon! Michelle Patton 18-Sep-1955 is having a Colonoscopy and is needing clearance for her Plavix . The request is in EPIC Under Encounters and notes from [facility] Dr Samuel nurse. (13:26) dsuttle: can hold, that's fine

## 2023-10-16 ENCOUNTER — Encounter: Payer: Self-pay | Admitting: *Deleted

## 2023-10-16 ENCOUNTER — Other Ambulatory Visit: Payer: Self-pay | Admitting: *Deleted

## 2023-10-16 MED ORDER — PEG 3350-KCL-NA BICARB-NACL 420 G PO SOLR
4000.0000 mL | Freq: Once | ORAL | 0 refills | Status: AC
Start: 2023-10-16 — End: 2023-10-16

## 2023-10-16 NOTE — Telephone Encounter (Signed)
 See TE on 10/15/23

## 2023-10-16 NOTE — Telephone Encounter (Signed)
 Pt has been scheduled for 11/02/23. Instructions mailed and prep sent to pharmacy.

## 2023-10-17 ENCOUNTER — Encounter: Payer: Self-pay | Admitting: Primary Care

## 2023-10-17 ENCOUNTER — Other Ambulatory Visit: Payer: Self-pay | Admitting: Cardiology

## 2023-10-17 ENCOUNTER — Ambulatory Visit: Admitting: Primary Care

## 2023-10-17 VITALS — BP 127/78 | HR 69 | Ht 66.0 in | Wt 233.6 lb

## 2023-10-17 DIAGNOSIS — G4733 Obstructive sleep apnea (adult) (pediatric): Secondary | ICD-10-CM

## 2023-10-17 DIAGNOSIS — Z8669 Personal history of other diseases of the nervous system and sense organs: Secondary | ICD-10-CM

## 2023-10-17 DIAGNOSIS — F17210 Nicotine dependence, cigarettes, uncomplicated: Secondary | ICD-10-CM

## 2023-10-17 NOTE — Patient Instructions (Addendum)
 - Complete home sleep study, send mychart message or call 2-3 weeks after mailing back for results - Treatment options include weight loss, oral appliance, re-trying CPAP with nasal mask or Inspire  - [employer] and ask about coverage for Zepbound for OSA/Obesity      Sleep Apnea  Sleep apnea is a condition that affects your breathing while you are sleeping. Your tongue or soft tissue in your throat may block the flow of air while you sleep. You may have shallow breathing or stop breathing for short periods of time. People with sleep apnea may snore loudly. There are three kinds of sleep apnea: Obstructive sleep apnea. This kind is caused by a blocked or collapsed airway. This is the most common. Central sleep apnea. This kind happens when the part of the brain that controls breathing does not send the correct signals to the muscles that control breathing. Mixed sleep apnea. This is a combination of obstructive and central sleep apnea. What are the causes? The most common cause of sleep apnea is a collapsed or blocked airway. What increases the risk? Being very overweight. Having family members with sleep apnea. Having a tongue or tonsils that are larger than normal. Having a small airway or jaw problems. Being older. What are the signs or symptoms? Loud snoring. Restless sleep. Trouble staying asleep. Being sleepy or tired during the day. Waking up gasping or choking. Having a headache in the morning. Mood swings. Having a hard time remembering things and concentrating. How is this diagnosed? A medical history. A physical exam. A sleep study. This is also called a polysomnography test. This test is done at a sleep lab or in your home while you are sleeping. How is this treated? Treatment may include: Sleeping on your side. Losing weight if you're overweight. Wearing an oral appliance. This is a mouthpiece that moves your lower jaw forward. Using a positive  airway pressure (PAP) device to keep your airways open while you sleep, such as: A continuous positive airway pressure (CPAP) device. This device gives forced air through a mask when you breathe out. This keeps your airways open. A bilevel positive airway pressure (BIPAP) device. This device gives forced air through a mask when you breathe in and when you breathe out to keep your airways open. Having surgery if other treatments do not work. If your sleep apnea is not treated, you may be at risk for: Heart failure. Heart attack. Stroke. Type 2 diabetes or a problem with your blood sugar called insulin  resistance. Follow these instructions at home: Medicines Take your medicines only as told by your health care provider. Avoid alcohol, medicines to help you relax, and certain pain medicines. These may make sleep apnea worse. General instructions Do not smoke, vape, or use products with nicotine or tobacco in them. If you need help quitting, talk with your provider. If you were given a PAP device to open your airway while you sleep, use it as told by your provider. If you're having surgery, make sure to tell your provider you have sleep apnea. You may need to bring your PAP device with you. Contact a health care provider if: The PAP device that you were given to use during sleep bothers you or does not seem to be working. You do not feel better or you feel worse. Get help right away if: You have trouble breathing. You have chest pain. You have trouble talking. One side of your body feels weak. A part of your  face is hanging down. These symptoms may be an emergency. Call 911 right away. Do not wait to see if the symptoms will go away. Do not drive yourself to the hospital. This information is not intended to replace advice given to you by your health care provider. Make sure you discuss any questions you have with your health care provider. Document Revised: 01/11/2023 Document Reviewed:  06/15/2022 Elsevier Patient Education  2024 [employer].

## 2023-10-17 NOTE — Progress Notes (Signed)
 @Patient  ID: Michelle Patton, female    DOB: [birth year], 68 y.o.   MRN: [id number]  Chief Complaint  Patient presents with   Establish Care    Referring provider: Levora Reyes SAUNDERS, MD  HPI: 68 year old female, current smoker. PMH significant for heart failure, HTN, aortic stenosis, PAD, OSA, COPD, GERD, hypothyroidism, obesity.   10/17/2023 Patient presents today for sleep consult. Referred Dr. Alvan. Dx with sleep apnea several years ago. HST ([facility]) in 2022 showed moderate OSA, AHI 19/hour. She was on CPAP previously but was unable to wear CPAP since she sleeps on her stomach. She had difficulty sleeping with PAP therapy, she did not try nasal mask.  Since she has retired she has no trouble sleeping. Denies significant fatigue  She is minimally symptomatic.  According to family she snores. She sleeps separately form her husband. She shares a bed with her dogs.  She has not woken up gasping or choking Bedtime is between 10-11pm Takes her 5 mins to fall asleep She wakes up on average once  She starts her day between 7-8 or 9-10; varies  She is retired She had sleep study several years ago Not currently wearing CPAP or oxygen She has gained 30-40 lbs in the last 2 years  No concern for narcolepsy or cataplexy    Allergies  Allergen Reactions   Iodinated Contrast Media Anaphylaxis, Shortness Of Breath, Itching, Swelling and Other (See Comments)    Patient was given Omni 350 and suffered from itching, chest pain and shortness of breath. Patient was taken to ED after onset of reaction. 10/11/2021  MD zackowski noted pt had tongue and throat swelling, had to give pt epinephrine     Sulfa Antibiotics Nausea And Vomiting    Immunization History  Administered Date(s) Administered   Influenza, High Dose Seasonal PF 02/26/2023   Influenza,inj,Quad PF,6+ Mos 01/02/2019, 02/17/2020, 01/16/2022   Influenza-Unspecified 02/20/2021   Moderna SARS-COV2 Booster Vaccination 09/02/2020    Moderna Sars-Covid-2 Vaccination 07/12/2019, 08/12/2019, 02/20/2020, 01/07/2021   Pneumococcal Conjugate-13 05/25/2017   Pneumococcal Polysaccharide-23 01/09/2019, 01/09/2019   Respiratory Syncytial Virus Vaccine ,Recomb Aduvanted(Arexvy ) 01/16/2022   Zoster, Unspecified 11/03/2020    Past Medical History:  Diagnosis Date   Aortic regurgitation 07/20/2014   Aortic stenosis, severe 07/20/2014   Arthritis    DVT (deep venous thrombosis) (HCC)    Family history of adverse reaction to anesthesia    Reports father deliurm in his 71's with CABG   GERD (gastroesophageal reflux disease)    History of pneumonia    Hx of repair of rotator cuff    R and L rotator cuff repair   Hyperlipidemia    Hypertension    Insomnia, unspecified    patient stated d/t menopause   Mesenteric ischemia (HCC)    Muscle weakness (generalized)    Peripheral artery disease (HCC)    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   Redo sternotomy for 21 mm Medtronic Freestyle porcine aortic root graft w/ reimplantation of left main and right coronary arteries   Sleep apnea    diagnosed multiple years ago at [facility] aortic stenosis, congenital - s/p repair during childhood     Tobacco History: Social History   Tobacco Use  Smoking Status Some Days   Current packs/day: 0.00   Average packs/day: 0.5 packs/day for 40.0 years (20.0 ttl pk-yrs)   Types: Cigarettes   Start date: 05/1981   Last attempt to quit: 05/2021  Years since quitting: 2.3  Smokeless Tobacco Never   Ready to quit: Not Answered Counseling given: Not Answered   Outpatient Medications Prior to Visit  Medication Sig Dispense Refill   acetaminophen  (TYLENOL ) 325 MG tablet Take 650 mg by mouth every 6 (six) hours as needed for mild pain, moderate pain, fever or headache.     aspirin  EC 81 MG tablet Take 1 tablet (81 mg total) by mouth daily. Swallow whole. 90 tablet 3    Cholecalciferol (VITAMIN D-3 PO) Take 1 capsule by mouth in the morning and at bedtime.     clopidogrel  (PLAVIX ) 75 MG tablet Take 1 tablet (75 mg total) by mouth daily. 90 tablet 3   dicyclomine  (BENTYL ) 10 MG capsule Take 1 capsule (10 mg total) by mouth every 12 (twelve) hours as needed (abdominal pain). 180 capsule 0   estradiol  (ESTRACE ) 2 MG tablet Take 1 tablet (2 mg total) by mouth daily. 30 tablet 3   ezetimibe  (ZETIA ) 10 MG tablet TAKE 1 TABLET BY MOUTH DAILY 90 tablet 3   Flaxseed, Linseed, (FLAX SEED OIL PO) Take 1 capsule by mouth in the morning.     fluticasone  (FLONASE ) 50 MCG/ACT nasal spray Place 1 spray into both nostrils 2 (two) times daily. 16 g 2   losartan  (COZAAR ) 25 MG tablet TAKE 1 TABLET BY MOUTH DAILY 90 tablet 3   metoprolol  tartrate (LOPRESSOR ) 25 MG tablet TAKE 1 TABLET BY MOUTH TWICE  DAILY 180 tablet 3   Multiple Minerals (CALCIUM -MAGNESIUM -ZINC) TABS Take 1 tablet by mouth in the morning.     NP THYROID  120 MG tablet Take 120 mg by mouth daily before breakfast.     progesterone  (PROMETRIUM ) 200 MG capsule Take 2 capsules (400 mg total) by mouth daily. (Patient taking differently: Take 400 mg by mouth at bedtime.) 180 capsule 1   RABEprazole  (ACIPHEX ) 20 MG tablet Take 1 tablet (20 mg total) by mouth 2 (two) times daily before a meal. 180 tablet 3   rosuvastatin  (CRESTOR ) 40 MG tablet TAKE 1 TABLET BY MOUTH DAILY 90 tablet 2   varenicline  (CHANTIX ) 1 MG tablet Take 1 tablet (1 mg total) by mouth 2 (two) times daily. 180 tablet 3   No facility-administered medications prior to visit.   Review of Systems  Review of Systems  Constitutional:  Negative for fatigue.  HENT: Negative.    Respiratory: Negative.    Psychiatric/Behavioral:  Negative for sleep disturbance.     Physical Exam  BP 127/78   Pulse 69   Ht 5' 6 (1.676 m)   Wt 233 lb 9.6 oz (106 kg)   SpO2 97% Comment: RA  BMI 37.70 kg/m  Physical Exam Constitutional:      General: She is not in  acute distress.    Appearance: Normal appearance. She is not ill-appearing.  HENT:     Head: Normocephalic and atraumatic.  Cardiovascular:     Rate and Rhythm: Normal rate and regular rhythm.  Pulmonary:     Effort: Pulmonary effort is normal.     Breath sounds: No wheezing, rhonchi or rales.  Skin:    General: Skin is warm and dry.  Neurological:     General: No focal deficit present.     Mental Status: She is alert and oriented to person, place, and time. Mental status is at baseline.  Psychiatric:        Mood and Affect: Mood normal.        Behavior: Behavior normal.  Thought Content: Thought content normal.        Judgment: Judgment normal.      Lab Results:  CBC    Component Value Date/Time   WBC 8.6 04/19/2023 1623   RBC 4.65 04/19/2023 1623   HGB 13.0 04/19/2023 1623   HGB 12.8 12/29/2021 1557   HCT 39.6 04/19/2023 1623   HCT 38.7 12/29/2021 1557   PLT 292.0 04/19/2023 1623   PLT 222 12/29/2021 1557   MCV 85.2 04/19/2023 1623   MCV 87 12/29/2021 1557   MCH 26.2 07/26/2022 1600   MCHC 32.8 04/19/2023 1623   RDW 14.8 04/19/2023 1623   RDW 13.1 12/29/2021 1557   LYMPHSABS 2.9 04/19/2023 1623   LYMPHSABS 2.8 12/29/2021 1557   MONOABS 0.8 04/19/2023 1623   EOSABS 0.2 04/19/2023 1623   EOSABS 0.3 12/29/2021 1557   BASOSABS 0.1 04/19/2023 1623   BASOSABS 0.1 12/29/2021 1557    BMET    Component Value Date/Time   NA 139 04/19/2023 1623   NA 140 07/18/2022 0929   K 4.3 04/19/2023 1623   CL 105 04/19/2023 1623   CO2 25 04/19/2023 1623   GLUCOSE 88 04/19/2023 1623   BUN 14 04/19/2023 1623   BUN 10 07/18/2022 0929   CREATININE 0.93 04/19/2023 1623   CREATININE 0.89 06/17/2021 0743   CALCIUM  9.2 04/19/2023 1623   GFRNONAA >60 03/21/2022 0651   GFRNONAA 82 09/22/2020 1637   GFRAA 95 09/22/2020 1637    BNP    Component Value Date/Time   BNP 39.0 10/11/2021 1703    ProBNP    Component Value Date/Time   PROBNP 155.0 (H) 04/19/2023 1623     Imaging: No results found.   Assessment & Plan:   No problem-specific Assessment & Plan notes found for this encounter.  Hx sleep apnea Diagnosed with moderate OSA in 2022, intolerant to CPAP. She continues to snore loudly. No significant daytime sleepiness. Reviewed risks of untreated sleep apnea and treatment options including weight loss, oral appliance re-trying CPAP with nasal mask or Inspire. She is unsure about interest in [place], would be open to weight loss medication. Ordering HST to re-assess OSA as current sleep study is > 2 years and would need to be repeated for [facility] candidacy. Advised patient call insurance to check on coverage for Zepbound.    Almarie LELON Ferrari, NP 10/17/2023

## 2023-11-02 ENCOUNTER — Encounter (HOSPITAL_COMMUNITY)
Admission: RE | Disposition: A | Discharge status: Home / Self Care | Payer: Self-pay | Source: Home / Self Care | Attending: Gastroenterology

## 2023-11-02 ENCOUNTER — Ambulatory Visit (HOSPITAL_COMMUNITY): Admitting: Anesthesiology

## 2023-11-02 ENCOUNTER — Ambulatory Visit (HOSPITAL_BASED_OUTPATIENT_CLINIC_OR_DEPARTMENT_OTHER): Admitting: Anesthesiology

## 2023-11-02 ENCOUNTER — Encounter (HOSPITAL_COMMUNITY): Payer: Self-pay | Admitting: Gastroenterology

## 2023-11-02 ENCOUNTER — Other Ambulatory Visit: Payer: Self-pay

## 2023-11-02 ENCOUNTER — Ambulatory Visit (HOSPITAL_COMMUNITY)
Admission: RE | Admit: 2023-11-02 | Discharge: 2023-11-02 | Disposition: A | Discharge status: Home / Self Care | Attending: Gastroenterology | Admitting: Gastroenterology

## 2023-11-02 DIAGNOSIS — D120 Benign neoplasm of cecum: Secondary | ICD-10-CM

## 2023-11-02 DIAGNOSIS — D122 Benign neoplasm of ascending colon: Secondary | ICD-10-CM | POA: Insufficient documentation

## 2023-11-02 DIAGNOSIS — G4730 Sleep apnea, unspecified: Secondary | ICD-10-CM | POA: Insufficient documentation

## 2023-11-02 DIAGNOSIS — K5730 Diverticulosis of large intestine without perforation or abscess without bleeding: Secondary | ICD-10-CM | POA: Diagnosis not present

## 2023-11-02 DIAGNOSIS — Z86718 Personal history of other venous thrombosis and embolism: Secondary | ICD-10-CM | POA: Insufficient documentation

## 2023-11-02 DIAGNOSIS — K648 Other hemorrhoids: Secondary | ICD-10-CM | POA: Insufficient documentation

## 2023-11-02 DIAGNOSIS — F17210 Nicotine dependence, cigarettes, uncomplicated: Secondary | ICD-10-CM | POA: Diagnosis not present

## 2023-11-02 DIAGNOSIS — Z1211 Encounter for screening for malignant neoplasm of colon: Secondary | ICD-10-CM | POA: Insufficient documentation

## 2023-11-02 DIAGNOSIS — Z952 Presence of prosthetic heart valve: Secondary | ICD-10-CM | POA: Insufficient documentation

## 2023-11-02 DIAGNOSIS — D123 Benign neoplasm of transverse colon: Secondary | ICD-10-CM | POA: Diagnosis not present

## 2023-11-02 DIAGNOSIS — J449 Chronic obstructive pulmonary disease, unspecified: Secondary | ICD-10-CM | POA: Diagnosis not present

## 2023-11-02 DIAGNOSIS — I10 Essential (primary) hypertension: Secondary | ICD-10-CM | POA: Diagnosis not present

## 2023-11-02 DIAGNOSIS — K219 Gastro-esophageal reflux disease without esophagitis: Secondary | ICD-10-CM | POA: Insufficient documentation

## 2023-11-02 DIAGNOSIS — I2510 Atherosclerotic heart disease of native coronary artery without angina pectoris: Secondary | ICD-10-CM | POA: Insufficient documentation

## 2023-11-02 DIAGNOSIS — Z7902 Long term (current) use of antithrombotics/antiplatelets: Secondary | ICD-10-CM | POA: Diagnosis not present

## 2023-11-02 DIAGNOSIS — I352 Nonrheumatic aortic (valve) stenosis with insufficiency: Secondary | ICD-10-CM | POA: Insufficient documentation

## 2023-11-02 HISTORY — PX: COLONOSCOPY: SHX5424

## 2023-11-02 LAB — HM COLONOSCOPY

## 2023-11-02 SURGERY — COLONOSCOPY
Anesthesia: General

## 2023-11-02 MED ORDER — PROPOFOL 500 MG/50ML IV EMUL
INTRAVENOUS | Status: DC | PRN
  Administered 2023-11-02: 150 ug/kg/min via INTRAVENOUS
  Administered 2023-11-02 (×2): 50 mg via INTRAVENOUS

## 2023-11-02 MED ORDER — LIDOCAINE 2% (20 MG/ML) 5 ML SYRINGE
INTRAMUSCULAR | Status: DC | PRN
  Administered 2023-11-02: 100 mg via INTRAVENOUS

## 2023-11-02 MED ORDER — LACTATED RINGERS IV SOLN: INTRAVENOUS | Status: DC | PRN

## 2023-11-02 MED ORDER — DEXMEDETOMIDINE HCL IN NACL 80 MCG/20ML IV SOLN
INTRAVENOUS | Status: DC | PRN
Start: 2023-11-02 — End: 2023-11-02
  Administered 2023-11-02 (×2): 10 ug via INTRAVENOUS

## 2023-11-02 MED ORDER — LACTATED RINGERS IV SOLN: INTRAVENOUS | Status: DC

## 2023-11-02 NOTE — Op Note (Signed)
 [facility] Patient Name: Michelle Patton Procedure Date: 11/02/2023 11:44 AM MRN: [id number] Date of Birth: March 22, 1956 Attending MD: Toribio Fortune , , [id number] CSN: [id number] Age: 68 Admit Type: Outpatient Procedure:                Colonoscopy Indications:              High risk colon cancer surveillance: Personal                            history of sessile serrated colon polyp (10 mm or                            greater in size) Providers:                Toribio Fortune, Crystal Page, Alm Dorcas Balm., Technician Referring MD:              Medicines:                Monitored Anesthesia Care Complications:            No immediate complications. Estimated Blood Loss:     Estimated blood loss: none. Procedure:                Pre-Anesthesia Assessment:                           - Prior to the procedure, a History and Physical                            was performed, and patient medications, allergies                            and sensitivities were reviewed. The patient's                            tolerance of previous anesthesia was reviewed.                           - The risks and benefits of the procedure and the                            sedation options and risks were discussed with the                            patient. All questions were answered and informed                            consent was obtained.                           - ASA Grade Assessment: III - A patient with severe                            systemic disease.  After obtaining informed consent, the colonoscope                            was passed under direct vision. Throughout the                            procedure, the patient's blood pressure, pulse, and                            oxygen saturations were monitored continuously. The                            [id number]-HQ190L ([id number]) scope was introduced through                             the anus and advanced to the the cecum, identified                            by appendiceal orifice and ileocecal valve. The                            colonoscopy was performed without difficulty. The                            patient tolerated the procedure well. The quality                            of the bowel preparation was good. Scope In: 11:56:02 AM Scope Out: 12:15:24 PM Scope Withdrawal Time: 0 hours 12 minutes 56 seconds  Total Procedure Duration: 0 hours 19 minutes 22 seconds  Findings:      The perianal and digital rectal examinations were normal.      Five sessile polyps were found in the transverse colon, ascending colon       and cecum. The polyps were 2 to 6 mm in size. These polyps were removed       with a cold snare. Resection and retrieval were complete.      Scattered medium-mouthed diverticula were found in the sigmoid colon and       ascending colon.      Non-bleeding internal hemorrhoids were found during retroflexion. The       hemorrhoids were small. Impression:               - Five 2 to 6 mm polyps in the transverse colon, in                            the ascending colon and in the cecum, removed with                            a cold snare. Resected and retrieved.                           - Diverticulosis in the sigmoid colon and in the  ascending colon.                           - Non-bleeding internal hemorrhoids. Moderate Sedation:      Per Anesthesia Care Recommendation:           - Discharge patient to home (ambulatory).                           - Resume previous diet.                           - Await pathology results.                           - Repeat colonoscopy in 3 years for surveillance. Procedure Code(s):        --- Professional ---                           [phone], Colonoscopy, flexible; with removal of                            tumor(s), polyp(s), or other lesion(s) by snare                             technique Diagnosis Code(s):        --- Professional ---                           Z86.010, Personal history of colonic polyps                           D12.3, Benign neoplasm of transverse colon (hepatic                            flexure or splenic flexure)                           D12.2, Benign neoplasm of ascending colon                           D12.0, Benign neoplasm of cecum                           K64.8, Other hemorrhoids                           K57.30, Diverticulosis of large intestine without                            perforation or abscess without bleeding CPT copyright 2022 American [employer] Association. All rights reserved. The codes documented in this report are preliminary and upon coder review may  be revised to meet current compliance requirements. Toribio Fortune, MD Toribio Fortune,  11/02/2023 12:23:01 PM This report has been signed electronically. Number of Addenda: 0

## 2023-11-02 NOTE — Transfer of Care (Signed)
 Immediate Anesthesia Transfer of Care Note  Patient: Michelle Patton  Procedure(s) Performed: COLONOSCOPY  Patient Location: Endoscopy Unit  Anesthesia Type:General  Level of Consciousness: awake  Airway & Oxygen Therapy: Patient Spontanous Breathing  Post-op Assessment: Report given to RN and Post -op Vital signs reviewed and stable  Post vital signs: Reviewed and stable  Last Vitals:  Vitals Value Taken Time  BP 91/50 11/02/23 12:17  Temp 36.8 C 11/02/23 12:17  Pulse 70 11/02/23 12:17  Resp 14 11/02/23 12:17  SpO2 97 % 11/02/23 12:17    Last Pain:  Vitals:   11/02/23 1217  TempSrc: Oral  PainSc: 0-No pain      Patients Stated Pain Goal: 8 (11/02/23 0923)  Complications: No notable events documented.

## 2023-11-02 NOTE — Anesthesia Postprocedure Evaluation (Signed)
 Anesthesia Post Note  Patient: Michelle Patton  Procedure(s) Performed: COLONOSCOPY  Patient location during evaluation: Phase II Anesthesia Type: General Level of consciousness: awake Pain management: pain level controlled Vital Signs Assessment: post-procedure vital signs reviewed and stable Respiratory status: spontaneous breathing and respiratory function stable Cardiovascular status: blood pressure returned to baseline and stable Postop Assessment: no headache and no apparent nausea or vomiting Anesthetic complications: no Comments: Late entry   No notable events documented.   Last Vitals:  Vitals:   11/02/23 1217 11/02/23 1223  BP: (!) 91/50 (!) 108/46  Pulse: 70 79  Resp: 14 17  Temp: 36.8 C   SpO2: 97% 97%    Last Pain:  Vitals:   11/02/23 1217  TempSrc: Oral  PainSc: 0-No pain                 Yvonna JINNY Bosworth

## 2023-11-02 NOTE — Discharge Instructions (Signed)
You are being discharged to home.  Resume your previous diet.  We are waiting for your pathology results.  Your physician has recommended a repeat colonoscopy in three years for surveillance.  Restart Plavix today.

## 2023-11-02 NOTE — H&P (Signed)
 Michelle Patton is an 68 y.o. female.   Chief Complaint: history of colon polyps and piecemeal polypectomy. HPI: Michelle Patton is a 68 y.o. 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  The patient denies having any nausea, vomiting, fever, chills, hematochezia, melena, hematemesis, abdominal distention,  diarrhea, jaundice, pruritus or weight loss.  Reports presenting persistent pain in her abdomen which she is mild to moderate and related to her chronic mesenteric ischemia.   Past Medical History:  Diagnosis Date   Aortic regurgitation 07/20/2014   Aortic stenosis, severe 07/20/2014   Arthritis    DVT (deep venous thrombosis) (HCC)    Family history of adverse reaction to anesthesia    Reports father deliurm in his 6's with CABG   GERD (gastroesophageal reflux disease)    History of pneumonia    Hx of repair of rotator cuff    R and L rotator cuff repair   Hyperlipidemia    Hypertension    Insomnia, unspecified    patient stated d/t menopause   Mesenteric ischemia (HCC)    Muscle weakness (generalized)    Peripheral artery disease (HCC)    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   Redo sternotomy for 21 mm Medtronic Freestyle porcine aortic root graft w/ reimplantation of left main and right coronary arteries   Sleep apnea    diagnosed multiple years ago at [facility] 2 aortic stenosis, congenital - s/p repair during childhood     Past Surgical History:  Procedure Laterality Date   ABDOMINAL AORTAGRAM  06/24/2012   ABDOMINAL  AORTAGRAM N/A 06/24/2012   Procedure: ABDOMINAL EZELLA;  Surgeon: Lonni GORMAN Blade, MD;  Location: [facility] CATH LAB;  Service: Cardiovascular;  Laterality: N/A;   AORTIC VALVE REPLACEMENT N/A 10/07/2014   Procedure: REDO AORTIC VALVE REPLACEMENT (AVR);  Surgeon: Sudie VEAR Laine, MD;  Location: [facility] OR;  Service: Open Heart Surgery;  Laterality: N/A;   ASCENDING AORTIC ROOT REPLACEMENT N/A 10/07/2014   Procedure: ASCENDING AORTIC ROOT REPLACEMENT;  Surgeon: Sudie VEAR Laine, MD;  Location: [facility] OR;  Service: Open Heart Surgery;  Laterality: N/A;   BIOPSY  09/27/2016   Procedure: BIOPSY;  Surgeon: Shaaron Lamar HERO, MD;  Location: [facility] ENDO SUITE;  Service: Endoscopy;;  colon   BIOPSY  10/18/2020   Procedure: BIOPSY;  Surgeon: Cindie Carlin POUR, DO;  Location: [facility];  Service: Endoscopy;;   BREAST REDUCTION SURGERY Bilateral 01/21/2018   Procedure: BREAST REDUCTION WITH LIPOSUCTION;  Surgeon: Marcus Lung, MD;  Location: [facility];  Service: Plastics;  Laterality: Bilateral;   CARDIAC VALVE SURGERY  1968   CARPAL TUNNEL RELEASE Right 03/02/2020   Procedure: CARPAL TUNNEL RELEASE;  Surgeon: Margrette Taft BRAVO, MD;  Location: [facility] ORS;  Service: Orthopedics;  Laterality: Right;   CATARACT EXTRACTION W/PHACO Left 05/30/2019   Procedure: CATARACT EXTRACTION PHACO AND INTRAOCULAR LENS PLACEMENT (IOC) (CDE: 4.94  );  Surgeon: Harrie Agent, MD;  Location: [facility] ORS;  Service: Ophthalmology;  Laterality: Left;   CATARACT EXTRACTION W/PHACO Right 06/16/2019   Procedure: CATARACT EXTRACTION PHACO AND INTRAOCULAR LENS PLACEMENT (IOC);  Surgeon: Harrie Agent, MD;  Location: [facility] ORS;  Service: Ophthalmology;  Laterality: Right;  CDE: 4.64   CERVICAL FUSION     CHOLECYSTECTOMY     COLONOSCOPY WITH PROPOFOL  N/A 09/27/2016   Dr. Shaaron: Diverticulosis, several tubular adenomas removed ranging 4 to 7 mm in size, internal grade 1 hemorrhoids, terminal ileum normal, segmental biopsies negative  for microscopic colitis.  Next colonoscopy June 2021   COLONOSCOPY WITH PROPOFOL  N/A 05/06/2020   internal hemorrhoids, sigmoid diverticulosis. Surveillance colonoscopy due in 2027   COLONOSCOPY WITH PROPOFOL  N/A 10/06/2022   Procedure: COLONOSCOPY WITH PROPOFOL ;  Surgeon: Eartha Angelia Sieving, MD;  Location: [facility];  Service: Gastroenterology;  Laterality: N/A;  10:45AM;ASA 1   ESOPHAGOGASTRODUODENOSCOPY (EGD) WITH PROPOFOL  N/A 09/27/2016   Dr. Shaaron: Small hiatal hernia, mild Schatzki ring status post disruption, LA grade a esophagitis   ESOPHAGOGASTRODUODENOSCOPY (EGD) WITH PROPOFOL  N/A 10/18/2020   non-obstructing mild Schatzki ring, gastritis s/ biopsy. Negative H.pylori.   HEMOSTASIS CLIP PLACEMENT  10/06/2022   Procedure: HEMOSTASIS CLIP PLACEMENT;  Surgeon: Eartha Angelia Sieving, MD;  [facility];  Service: Gastroenterology;;   ILIAC ARTERY STENT Left 12/2007   IR ANGIOGRAM EXTREMITY BILATERAL  07/12/2021   IR ANGIOGRAM VISCERAL SELECTIVE  10/29/2020   IR ANGIOGRAM VISCERAL SELECTIVE  07/12/2021   IR ANGIOGRAM VISCERAL SELECTIVE  03/21/2022   IR AORTAGRAM ABDOMINAL SERIALOGRAM  03/21/2022   IR IVUS EACH ADDITIONAL NON CORONARY VESSEL  07/12/2021   IR IVUS EACH ADDITIONAL NON CORONARY VESSEL  03/21/2022   IR PTA NON CORO-LOWER EXTREM  03/21/2022   IR RADIOLOGIST EVAL & MGMT  10/26/2020   IR RADIOLOGIST EVAL & MGMT  11/10/2020   IR RADIOLOGIST EVAL & MGMT  02/09/2021   IR RADIOLOGIST EVAL & MGMT  06/16/2021   IR RADIOLOGIST EVAL & MGMT  08/15/2021   IR RADIOLOGIST EVAL & MGMT  10/18/2021   IR RADIOLOGIST EVAL & MGMT  01/11/2022   IR RADIOLOGIST EVAL & MGMT  03/02/2022   IR RADIOLOGIST EVAL & MGMT  05/11/2022   IR RADIOLOGIST EVAL & MGMT  08/18/2022   IR RADIOLOGIST EVAL & MGMT  10/18/2022   IR RADIOLOGIST EVAL & MGMT  05/21/2023   IR RADIOLOGIST EVAL & MGMT  08/29/2023   IR THROMBECT PRIM MECH INIT (INCLU) MOD SED  03/21/2022   IR THROMBECT SEC MECH MOD SED   07/12/2021   IR TRANSCATH PLC STENT 1ST ART NOT LE CV CAR VERT CAR  10/29/2020   IR TRANSCATH PLC STENT 1ST ART NOT LE CV CAR VERT CAR  07/12/2021   IR US  GUIDE VASC ACCESS RIGHT  10/29/2020   IR US  GUIDE VASC ACCESS RIGHT  07/12/2021   IR US  GUIDE VASC ACCESS RIGHT  03/21/2022   KNEE ARTHROSCOPY WITH MEDIAL MENISECTOMY Right 01/10/2018   Procedure: RIGHT KNEE ARTHROSCOPY WITH PARTIAL MEDIAL MENISECTOMY;  Surgeon: Margrette Taft BRAVO, MD;  Location: [facility];  Service: Orthopedics;  Laterality: Right;   LAPAROSCOPIC APPENDECTOMY N/A 08/03/2022   Procedure: APPENDECTOMY LAPAROSCOPIC;  Surgeon: Rubin Calamity, MD;  Location: [facility] OR;  Service: General;  Laterality: N/A;   LEFT AND RIGHT HEART CATHETERIZATION WITH CORONARY ANGIOGRAM N/A 07/31/2014   Procedure: LEFT AND RIGHT HEART CATHETERIZATION WITH CORONARY ANGIOGRAM;  Surgeon: Lonni JONETTA Cash, MD;  Location[facility] CATH LAB;  Service: Cardiovascular;  Laterality: N/A;   MALONEY DILATION N/A 09/27/2016   Procedure: AGAPITO DILATION;  Surgeon: Shaaron Lamar HERO, MD;  [facility]: [facility];  Service: Endoscopy;  Laterality: N/A;   POLYPECTOMY  09/27/2016   Procedure:  POLYPECTOMY;  Surgeon: Shaaron Lamar HERO, MD;  [facility]: [facility];  Service: Endoscopy;;  colon   POLYPECTOMY  10/06/2022   Procedure: POLYPECTOMY;  Surgeon: Eartha Angelia Sieving, MD;  [facility]: [facility];  Service: Gastroenterology;;   ROTATOR CUFF REPAIR Bilateral    SUBMUCOSAL LIFTING INJECTION  10/06/2022   Procedure: SUBMUCOSAL LIFTING INJECTION;  Surgeon: Eartha Angelia Sieving, MD;  [facility];  Service: Gastroenterology;;   TEE WITHOUT CARDIOVERSION N/A 07/07/2014   Procedure: TRANSESOPHAGEAL ECHOCARDIOGRAM (TEE);  Surgeon: Dorn JULIANNA Ross, MD;  [facility];  Service: Cardiology;  Laterality: N/A;   TEE WITHOUT CARDIOVERSION N/A 07/20/2014   Procedure: TRANSESOPHAGEAL ECHOCARDIOGRAM (TEE) WITH PROPOFOL ;  Surgeon: Dorn JULIANNA Ross, MD;   Location: [facility];  Service: Endoscopy;  Laterality: N/A;   TEE WITHOUT CARDIOVERSION N/A 10/07/2014   Procedure: TRANSESOPHAGEAL ECHOCARDIOGRAM (TEE);  Surgeon: Sudie VEAR Laine, MD;  Location: [facility] OR;  Service: Open Heart Surgery;  Laterality: N/A;    Family History  Problem Relation Age of Onset   Hypertension Mother    Hypertension Father    Heart disease Father        before age 73   Other Father        varicose veins   COPD Father    Colon cancer Neg Hx    Celiac disease Neg Hx    Inflammatory bowel disease Neg Hx    Social History:  reports that she has been smoking cigarettes. She started smoking about 42 years ago. She has a 20 pack-year smoking history. She has never used smokeless tobacco. She reports current alcohol use of about 2.0 standard drinks of alcohol per week. She reports that she does not use drugs.  Allergies:  Allergies  Allergen Reactions   Iodinated Contrast Media Anaphylaxis, Shortness Of Breath, Itching, Swelling and Other (See Comments)    Patient was given Omni 350 and suffered from itching, chest pain and shortness of breath. Patient was taken to ED after onset of reaction. 10/11/2021  MD zackowski noted pt had tongue and throat swelling, had to give pt epinephrine     Sulfa Antibiotics Nausea And Vomiting    Medications Prior to Admission  Medication Sig Dispense Refill   acetaminophen  (TYLENOL ) 325 MG tablet Take 650 mg by mouth every 6 (six) hours as needed for mild pain, moderate pain, fever or headache.     aspirin  EC 81 MG tablet Take 1 tablet (81 mg total) by mouth daily. Swallow whole. 90 tablet 3   Cholecalciferol (VITAMIN D-3 PO) Take 1 capsule by mouth in the morning and at bedtime.     dicyclomine  (BENTYL ) 10 MG capsule Take 1 capsule (10 mg total) by mouth every 12 (twelve) hours as needed (abdominal pain). 180 capsule 0   estradiol  (ESTRACE ) 2 MG tablet Take 1 tablet (2 mg total) by mouth daily. 30 tablet 3   ezetimibe  (ZETIA ) 10 MG tablet  TAKE 1 TABLET BY MOUTH DAILY 90 tablet 2   Flaxseed, Linseed, (FLAX SEED OIL PO) Take 1 capsule by mouth in the morning.     fluticasone  (FLONASE ) 50 MCG/ACT nasal spray Place 1 spray into both nostrils 2 (two) times daily. 16 g 2   losartan  (COZAAR ) 25 MG tablet TAKE 1 TABLET BY MOUTH DAILY 90 tablet 3   metoprolol  tartrate (LOPRESSOR ) 25 MG tablet TAKE 1 TABLET BY MOUTH TWICE  DAILY 180 tablet 3   Multiple Minerals (CALCIUM -MAGNESIUM -ZINC) TABS Take 1 tablet by mouth in the morning.  NP THYROID  120 MG tablet Take 120 mg by mouth daily before breakfast.     RABEprazole  (ACIPHEX ) 20 MG tablet Take 1 tablet (20 mg total) by mouth 2 (two) times daily before a meal. 180 tablet 3   rosuvastatin  (CRESTOR ) 40 MG tablet TAKE 1 TABLET BY MOUTH DAILY 90 tablet 2   varenicline  (CHANTIX ) 1 MG tablet Take 1 tablet (1 mg total) by mouth 2 (two) times daily. 180 tablet 3   clopidogrel  (PLAVIX ) 75 MG tablet Take 1 tablet (75 mg total) by mouth daily. 90 tablet 3   progesterone  (PROMETRIUM ) 200 MG capsule Take 2 capsules (400 mg total) by mouth daily. (Patient taking differently: Take 400 mg by mouth at bedtime.) 180 capsule 1    No results found for this or any previous visit (from the past 48 hours). No results found.  Review of Systems  All other systems reviewed and are negative.   Blood pressure (!) 110/49, pulse 76, temperature 98 F (36.7 C), temperature source Oral, resp. rate 19, weight 106.6 kg, SpO2 98%. Physical Exam  GENERAL: The patient is AO x3, in no acute distress. HEENT: Head is normocephalic and atraumatic. EOMI are intact. Mouth is well hydrated and without lesions. NECK: Supple. No masses LUNGS: Clear to auscultation. No presence of rhonchi/wheezing/rales. Adequate chest expansion HEART: RRR, normal s1 and s2. ABDOMEN:mildly tender in the lower abdomen, no guarding, no peritoneal signs, and nondistended. BS +. No masses. EXTREMITIES: Without any cyanosis, clubbing, rash, lesions  or edema. NEUROLOGIC: AOx3, no focal motor deficit. SKIN: no jaundice, no rashes  Assessment/Plan Michelle Patton is a 68 y.o. 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  Will proceed with colonoscopy.  Michelle Eartha Flavors, MD 11/02/2023, 10:58 AM

## 2023-11-02 NOTE — Anesthesia Preprocedure Evaluation (Signed)
 Anesthesia Evaluation  Patient identified by MRN, date of birth, ID band Patient awake    Reviewed: Allergy & Precautions, H&P , NPO status , Patient's Chart, lab work & pertinent test results, reviewed documented beta blocker date and time   History of Anesthesia Complications (+) Family history of anesthesia reaction  Airway Mallampati: II  TM Distance: >3 FB Neck ROM: full    Dental no notable dental hx.    Pulmonary neg pulmonary ROS, sleep apnea , COPD, Current Smoker and Patient abstained from smoking.   Pulmonary exam normal breath sounds clear to auscultation       Cardiovascular Exercise Tolerance: Good hypertension, + Peripheral Vascular Disease and +CHF  negative cardio ROS  Rhythm:regular Rate:Normal     Neuro/Psych  Neuromuscular disease negative neurological ROS  negative psych ROS   GI/Hepatic negative GI ROS, Neg liver ROS,GERD  ,,  Endo/Other  negative endocrine ROSHypothyroidism    Renal/GU negative Renal ROS  negative genitourinary   Musculoskeletal   Abdominal   Peds  Hematology negative hematology ROS (+)   Anesthesia Other Findings   Reproductive/Obstetrics negative OB ROS                              Anesthesia Physical Anesthesia Plan  ASA: 3  Anesthesia Plan: General   Post-op Pain Management:    Induction:   PONV Risk Score and Plan: Propofol  infusion  Airway Management Planned:   Additional Equipment:   Intra-op Plan:   Post-operative Plan:   Informed Consent: I have reviewed the patients History and Physical, chart, labs and discussed the procedure including the risks, benefits and alternatives for the proposed anesthesia with the patient or authorized representative who has indicated his/her understanding and acceptance.     Dental Advisory Given  Plan Discussed with: CRNA  Anesthesia Plan Comments:         Anesthesia Quick  Evaluation

## 2023-11-05 ENCOUNTER — Encounter (INDEPENDENT_AMBULATORY_CARE_PROVIDER_SITE_OTHER): Payer: Self-pay | Admitting: *Deleted

## 2023-11-05 ENCOUNTER — Encounter (HOSPITAL_COMMUNITY): Payer: Self-pay | Admitting: Gastroenterology

## 2023-11-06 ENCOUNTER — Ambulatory Visit (INDEPENDENT_AMBULATORY_CARE_PROVIDER_SITE_OTHER): Payer: Self-pay | Admitting: Gastroenterology

## 2023-11-06 LAB — SURGICAL PATHOLOGY

## 2023-11-07 NOTE — Progress Notes (Signed)
 3 yr TCS noted in recall Patient result letter mailed procedure note and pathology result faxed to PCP

## 2023-11-08 ENCOUNTER — Ambulatory Visit: Payer: Self-pay | Admitting: Cardiology

## 2023-11-09 ENCOUNTER — Other Ambulatory Visit: Payer: Self-pay | Admitting: Cardiology

## 2023-11-16 ENCOUNTER — Encounter (INDEPENDENT_AMBULATORY_CARE_PROVIDER_SITE_OTHER): Payer: Self-pay | Admitting: Gastroenterology

## 2023-11-16 ENCOUNTER — Other Ambulatory Visit (INDEPENDENT_AMBULATORY_CARE_PROVIDER_SITE_OTHER): Payer: Self-pay

## 2023-11-16 DIAGNOSIS — K219 Gastro-esophageal reflux disease without esophagitis: Secondary | ICD-10-CM

## 2023-11-16 MED ORDER — RABEPRAZOLE SODIUM 20 MG PO TBEC: 20.0000 mg | DELAYED_RELEASE_TABLET | Freq: Two times a day (BID) | ORAL | 3 refills | Status: AC

## 2023-11-28 ENCOUNTER — Encounter

## 2023-11-28 DIAGNOSIS — Z8669 Personal history of other diseases of the nervous system and sense organs: Secondary | ICD-10-CM

## 2023-12-07 ENCOUNTER — Other Ambulatory Visit: Payer: Self-pay | Admitting: Family Medicine

## 2023-12-07 DIAGNOSIS — I10 Essential (primary) hypertension: Secondary | ICD-10-CM

## 2023-12-07 DIAGNOSIS — G4733 Obstructive sleep apnea (adult) (pediatric): Secondary | ICD-10-CM | POA: Diagnosis not present

## 2023-12-07 DIAGNOSIS — I5022 Chronic systolic (congestive) heart failure: Secondary | ICD-10-CM

## 2023-12-10 NOTE — Telephone Encounter (Signed)
 LM to call back and make an appointment has not been seen since 05/14/2023 due for follow up in June

## 2023-12-10 NOTE — Telephone Encounter (Signed)
 Pt needs appt. Will call today

## 2023-12-12 ENCOUNTER — Ambulatory Visit: Payer: Self-pay | Admitting: Primary Care

## 2023-12-12 NOTE — Progress Notes (Signed)
 Please let patient know sleep study showed mild sleep apnea with average 10 apneic events per hours. No significant oxygen drops. Please schedule follow up to discuss treatment options

## 2023-12-17 NOTE — Progress Notes (Signed)
 Called the pt and there was no answer and no option to leave vm. Sent her a message with results via mychart.

## 2023-12-21 ENCOUNTER — Ambulatory Visit (INDEPENDENT_AMBULATORY_CARE_PROVIDER_SITE_OTHER): Admitting: Family Medicine

## 2023-12-21 VITALS — BP 144/82 | HR 64 | Temp 98.5°F | Ht 66.0 in | Wt 229.0 lb

## 2023-12-21 DIAGNOSIS — R232 Flushing: Secondary | ICD-10-CM | POA: Insufficient documentation

## 2023-12-21 DIAGNOSIS — I5022 Chronic systolic (congestive) heart failure: Secondary | ICD-10-CM

## 2023-12-21 DIAGNOSIS — G479 Sleep disorder, unspecified: Secondary | ICD-10-CM | POA: Insufficient documentation

## 2023-12-21 DIAGNOSIS — R252 Cramp and spasm: Secondary | ICD-10-CM

## 2023-12-21 DIAGNOSIS — Z23 Encounter for immunization: Secondary | ICD-10-CM

## 2023-12-21 DIAGNOSIS — I252 Old myocardial infarction: Secondary | ICD-10-CM | POA: Insufficient documentation

## 2023-12-21 DIAGNOSIS — K219 Gastro-esophageal reflux disease without esophagitis: Secondary | ICD-10-CM | POA: Diagnosis not present

## 2023-12-21 DIAGNOSIS — Z1231 Encounter for screening mammogram for malignant neoplasm of breast: Secondary | ICD-10-CM

## 2023-12-21 DIAGNOSIS — N951 Menopausal and female climacteric states: Secondary | ICD-10-CM | POA: Insufficient documentation

## 2023-12-21 DIAGNOSIS — F32A Depression, unspecified: Secondary | ICD-10-CM | POA: Insufficient documentation

## 2023-12-21 DIAGNOSIS — E611 Iron deficiency: Secondary | ICD-10-CM | POA: Insufficient documentation

## 2023-12-21 DIAGNOSIS — I10 Essential (primary) hypertension: Secondary | ICD-10-CM | POA: Diagnosis not present

## 2023-12-21 DIAGNOSIS — E782 Mixed hyperlipidemia: Secondary | ICD-10-CM

## 2023-12-21 DIAGNOSIS — E559 Vitamin D deficiency, unspecified: Secondary | ICD-10-CM | POA: Insufficient documentation

## 2023-12-21 DIAGNOSIS — Z780 Asymptomatic menopausal state: Secondary | ICD-10-CM

## 2023-12-21 DIAGNOSIS — E039 Hypothyroidism, unspecified: Secondary | ICD-10-CM | POA: Diagnosis not present

## 2023-12-21 DIAGNOSIS — R351 Nocturia: Secondary | ICD-10-CM | POA: Insufficient documentation

## 2023-12-21 DIAGNOSIS — Z7689 Persons encountering health services in other specified circumstances: Secondary | ICD-10-CM

## 2023-12-21 DIAGNOSIS — Q230 Congenital stenosis of aortic valve: Secondary | ICD-10-CM

## 2023-12-21 DIAGNOSIS — F419 Anxiety disorder, unspecified: Secondary | ICD-10-CM | POA: Insufficient documentation

## 2023-12-21 LAB — COMPREHENSIVE METABOLIC PANEL WITH GFR
AG Ratio: 1.7 (calc) (ref 1.0–2.5)
ALT: 19 U/L (ref 6–29)
AST: 21 U/L (ref 10–35)
Albumin: 4.3 g/dL (ref 3.6–5.1)
Alkaline phosphatase (APISO): 49 U/L (ref 37–153)
BUN: 11 mg/dL (ref 7–25)
CO2: 25 mmol/L (ref 20–32)
Calcium: 9.9 mg/dL (ref 8.6–10.4)
Chloride: 105 mmol/L (ref 98–110)
Creat: 0.83 mg/dL (ref 0.50–1.05)
Globulin: 2.6 g/dL (ref 1.9–3.7)
Glucose, Bld: 100 mg/dL — ABNORMAL HIGH (ref 65–99)
Potassium: 4.9 mmol/L (ref 3.5–5.3)
Sodium: 137 mmol/L (ref 135–146)
Total Bilirubin: 0.4 mg/dL (ref 0.2–1.2)
Total Protein: 6.9 g/dL (ref 6.1–8.1)
eGFR: 78 mL/min/1.73m2 (ref >=60)

## 2023-12-21 LAB — TSH: TSH: 0.03 m[IU]/L — ABNORMAL LOW (ref 0.40–4.50)

## 2023-12-21 LAB — CBC WITH DIFFERENTIAL/PLATELET
Absolute Lymphocytes: 2208 {cells}/uL (ref 850–3900)
Absolute Monocytes: 1089 {cells}/uL — ABNORMAL HIGH (ref 200–950)
Basophils Absolute: 50 {cells}/uL (ref 0–200)
Basophils Relative: 0.5 %
Eosinophils Absolute: 277 {cells}/uL (ref 15–500)
Eosinophils Relative: 2.8 %
HCT: 42.8 % (ref 35.0–45.0)
Hemoglobin: 13.6 g/dL (ref 11.7–15.5)
MCH: 27.5 pg (ref 27.0–33.0)
MCHC: 31.8 g/dL — ABNORMAL LOW (ref 32.0–36.0)
MCV: 86.5 fL (ref 80.0–100.0)
MPV: 9.7 fL (ref 7.5–12.5)
Monocytes Relative: 11 %
Neutro Abs: 6277 {cells}/uL (ref 1500–7800)
Neutrophils Relative %: 63.4 %
Platelets: 267 Thousand/uL (ref 140–400)
RBC: 4.95 Million/uL (ref 3.80–5.10)
RDW: 13.5 % (ref 11.0–15.0)
Total Lymphocyte: 22.3 %
WBC: 9.9 Thousand/uL (ref 3.8–10.8)

## 2023-12-21 LAB — VITAMIN D 25 HYDROXY (VIT D DEFICIENCY, FRACTURES): Vit D, 25-Hydroxy: 92 ng/mL (ref 30–100)

## 2023-12-21 LAB — MAGNESIUM: Magnesium: 2.1 mg/dL (ref 1.5–2.5)

## 2023-12-21 NOTE — Progress Notes (Signed)
 Patient Office Visit  Assessment & Plan:  Essential hypertension -     CBC with Differential/Platelet -     Comprehensive metabolic panel with GFR  Mixed hyperlipidemia  Encounter to establish care -     VITAMIN D  25 Hydroxy (Vit-D Deficiency, Fractures)  Acquired hypothyroidism -     TSH  Gastroesophageal reflux disease without esophagitis  Screening mammogram for breast cancer -     3D Screening Mammogram, Left and Right; Future  Supravalvular aortic stenosis, congenital - s/p repair during childhood  Chronic systolic congestive heart failure (HCC)  Need for Tdap vaccination -     Tdap vaccine greater than or equal to 7yo IM  Postmenopausal -     DG Bone Density; Future  Leg cramps -     Magnesium    Assessment and Plan    Essential hypertension Blood pressure elevated today, likely white coat hypertension. Home readings lower. - Continue losartan  25 mg daily.  Congenital aortic valve stenosis, status post repair Repaired in 1968. - Regular follow-up with cardiologist every six months.  Chronic systolic heart failure Managed by cardiologist, no acute symptoms. - Regular follow-up with cardiologist every six months.  Mesenteric ischemia with stents, ongoing monitoring Morning pain managed by pressure. Recent ultrasound showed 50% proximal stenosis in different artery, no intervention needed. - Regular follow-up with interventional radiologist every six months.  Mixed hyperlipidemia Managed with Crestor  and Zetia .  Hypothyroidism Managed with NP thyroid , dosage adjusted by [facility] clinic.  Gastroesophageal reflux disease (GERD) Symptoms managed with medication twice daily.  Leg cramps Chronic, possibly hereditary, managed with magnesium  and topical CBD balm. No recent severe episodes.  Asymptomatic menopausal state on hormone therapy On Estrogen and Prometrium  for four years. Increased breast cancer risk. - Order mammogram at  [facility].  Obstructive sleep apnea, mild Diagnosed mild obstructive sleep apnea, no treatment initiated.  Nicotine dependence in remission Quit smoking eight months ago with Chantix , experiencing side effects, remains smoke-free.  Low bone mass (osteopenia) Last bone density scan in 2021 showed low bone mass in left hip, no significant bone loss. - Order bone density scan at [facility].  Arthritis of wrist and spine  General Health Maintenance Discussion of vaccinations and screenings. - Administer tetanus vaccine. - Order blood work to check anemia, kidney, liver, glucose, and thyroid  function.  Follow-Up Discussion of follow-up plans. - Schedule a physical exam within the next three months.     Test results were reviewed and analyzed as part of the medical decision making of this visit.  Congratulated her on tobacco cessation. Mammogram and DEXA scan ordered at [facility] Recommend healthy diet i.e mediterranean/DASH diet, consistent exercise - 30 minutes 5 day per week, and gradual weight loss.   Return if symptoms worsen or fail to improve, for physical.   Subjective:    Patient ID: Michelle Patton, female    DOB: 04/11/1956  Age: 68 y.o. MRN: [id number]  No chief complaint on file.   HPI Discussed the use of AI scribe software for clinical note transcription with the patient, who gave verbal consent to proceed.  History of Present Illness        Michelle Patton is a 68 year old female with a history of congenital heart disease, hypertension, hyperlipidemia, and mesenteric ischemia who is here to establish primary care  She has a history of congenital heart disease, which required surgical repair of supravascular cardiovascular aortic stenosis in 1968. In 2014, she experienced congestive heart failure, leading  to valve and aortic replacement in April 2015. She currently sees her cardiologist Dr. Alvan every six months and is on Plavix  due to mesenteric  ischemia  She was diagnosed with mesenteric ischemia/SMA stenosis approximately two to three years ago after experiencing severe abdominal pain. A CT scan confirmed the diagnosis, and she has undergone stenting procedures. She continues to experience pain, particularly in the morning, and manages it by lying on her side and applying pressure. She is monitored by an interventional radiologist every six months.  She takes losartan  25 mg for hypertension and reports home blood pressure readings of 103/64 mmHg. She follows a low-salt diet but occasionally craves salty foods. She does not have a regular exercise routine due to safety concerns in her neighborhood. patient does think she has white coat hypertension  She quit smoking approximately eight months ago with the help of Chantix , which she continues to take despite experiencing nausea.  She has been on hormone therapy with oral Estrogen and Prometrium  for the past four years. She has not had a mammogram since moving to her current location.  She experiences leg cramps, which she attributes to a hereditary condition, and manages them with magnesium  supplements and CBD balm. She reports that her last bone density scan in 2021 was okay, despite a history of fractures.  She has a history of gastroesophageal reflux disease, managed with daily medication, and takes Crestor  and Zetia  for cholesterol management. She reports experiencing leg cramps, which she attributes to a hereditary condition, and does not associate them with her medications.  She has mild sleep apnea but has not yet pursued treatment. She also reports arthritis in her wrist and a history of low bone mass in her left hip. Physical Exam CHEST: Lungs clear to auscultation bilaterally. EXTREMITIES: No edema in extremities. Results RADIOLOGY Ultrasound: 50% proximal stenosis in a different artery (07/2023)-history of SMA stenosis Assessment & Plan Essential hypertension Blood pressure  elevated today, likely white coat hypertension. Home readings lower. - Continue losartan  25 mg daily. May need to increase to 50mg  once per day.   Congenital aortic valve stenosis, status post repair Repaired in 1968. - Regular follow-up with cardiologist every six months.  Chronic systolic heart failure Managed by cardiologist, no acute symptoms. - Regular follow-up with cardiologist every six months.  Mesenteric ischemia with stents, ongoing monitoring Morning pain managed by pressure. Recent ultrasound showed 50% proximal stenosis in different artery, no intervention needed. - Regular follow-up with interventional radiologist every six months.  Mixed hyperlipidemia Managed with Crestor  and Zetia .  Hypothyroidism Managed with NP thyroid , dosage adjusted by [facility] clinic.  Gastroesophageal reflux disease (GERD) Symptoms managed with medication twice daily.  Leg cramps Chronic, possibly hereditary, managed with magnesium  and topical CBD balm. No recent severe episodes.  Asymptomatic menopausal state on hormone therapy On Estrogen and Prometrium  for four years. Increased breast cancer risk. - Order mammogram at [facility].  Obstructive sleep apnea, mild Diagnosed mild obstructive sleep apnea, no treatment initiated.  Nicotine dependence in remission Quit smoking eight months ago with Chantix , experiencing side effects, remains smoke-free.  Low bone mass (osteopenia) Last bone density scan in 2021 showed low bone mass in left hip, no significant bone loss. - Order bone density scan at [facility].  Arthritis of wrist and spine  General Health Maintenance Discussion of vaccinations and screenings. - Administer tetanus vaccine. - Order blood work to check anemia, kidney, liver, glucose, and thyroid  function.  Follow-Up Discussion of follow-up plans. - Schedule a physical  exam within the next three months.    The ASCVD Risk score (Arnett DK, et al., 2019) failed to  calculate for the following reasons:   Risk score cannot be calculated because patient has a medical history suggesting prior/existing ASCVD  Past Medical History:  Diagnosis Date   Allergy    Aortic regurgitation 07/20/2014   Aortic stenosis, severe 07/20/2014   Arthritis    CHF (congestive heart failure) (HCC)    DVT (deep venous thrombosis) (HCC)    Family history of adverse reaction to anesthesia    Reports father deliurm in his 33's with CABG   GERD (gastroesophageal reflux disease)    History of pneumonia    Hx of repair of rotator cuff    R and L rotator cuff repair   Hyperlipidemia    Hypertension    Insomnia, unspecified    patient stated d/t menopause   Mesenteric ischemia (HCC)    Muscle weakness (generalized)    Peripheral artery disease (HCC)    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   Redo sternotomy for 21 mm Medtronic Freestyle porcine aortic root graft w/ reimplantation of left main and right coronary arteries   Sleep apnea    diagnosed multiple years ago at [facility] aortic stenosis, congenital - s/p repair during childhood    Past Surgical History:  Procedure Laterality Date   ABDOMINAL AORTAGRAM  06/24/2012   ABDOMINAL AORTAGRAM N/A 06/24/2012   Procedure: ABDOMINAL EZELLA;  Surgeon: Lonni GORMAN Blade, MD;  Location: [facility] CATH LAB;  Service: Cardiovascular;  Laterality: N/A;   AORTIC VALVE REPLACEMENT N/A 10/07/2014   Procedure: REDO AORTIC VALVE REPLACEMENT (AVR);  Surgeon: Sudie VEAR Laine, MD;  Location: [facility] OR;  Service: Open Heart Surgery;  Laterality: N/A;   ASCENDING AORTIC ROOT REPLACEMENT N/A 10/07/2014   Procedure: ASCENDING AORTIC ROOT REPLACEMENT;  Surgeon: Sudie VEAR Laine, MD;  Location: [facility] OR;  Service: Open Heart Surgery;  Laterality: N/A;   BIOPSY  09/27/2016   Procedure: BIOPSY;  Surgeon: Shaaron Lamar HERO, MD;  Location: [facility] ENDO [facility];  Service: Endoscopy;;  colon   BIOPSY   10/18/2020   Procedure: BIOPSY;  Surgeon: Cindie Carlin POUR, DO;  Location: [facility];  Service: Endoscopy;;   BREAST REDUCTION SURGERY Bilateral 01/21/2018   Procedure: BREAST REDUCTION WITH LIPOSUCTION;  Surgeon: Marcus Lung, MD;  Location: [facility];  Service: Plastics;  Laterality: Bilateral;   CARDIAC VALVE SURGERY  1968   CARPAL TUNNEL RELEASE Right 03/02/2020   Procedure: CARPAL TUNNEL RELEASE;  Surgeon: Margrette Taft BRAVO, MD;  Location: [facility] ORS;  Service: Orthopedics;  Laterality: Right;   CATARACT EXTRACTION W/PHACO Left 05/30/2019   Procedure: CATARACT EXTRACTION PHACO AND INTRAOCULAR LENS PLACEMENT (IOC) (CDE: 4.94  );  Surgeon: Harrie Agent, MD;  Location: [facility] ORS;  Service: Ophthalmology;  Laterality: Left;   CATARACT EXTRACTION W/PHACO Right 06/16/2019   Procedure: CATARACT EXTRACTION PHACO AND INTRAOCULAR LENS PLACEMENT (IOC);  Surgeon: Harrie Agent, MD;  Location: [facility] ORS;  Service: Ophthalmology;  Laterality: Right;  CDE: 4.64   CERVICAL FUSION     CHOLECYSTECTOMY     COLONOSCOPY N/A 11/02/2023   Procedure: COLONOSCOPY;  Surgeon: Eartha Angelia Sieving, MD;  [facility]: [facility];  Service: Gastroenterology;  Laterality: N/A;  11:30 am, asa 1/2   COLONOSCOPY WITH PROPOFOL  N/A 09/27/2016   Dr. Shaaron: Diverticulosis, several tubular adenomas removed ranging 4 to 7 mm in size, internal grade 1 hemorrhoids,  terminal ileum normal, segmental biopsies negative for microscopic colitis.  Next colonoscopy June 2021   COLONOSCOPY WITH PROPOFOL  N/A 05/06/2020   internal hemorrhoids, sigmoid diverticulosis. Surveillance colonoscopy due in 2027   COLONOSCOPY WITH PROPOFOL  N/A 10/06/2022   Procedure: COLONOSCOPY WITH PROPOFOL ;  Surgeon: Eartha Angelia Sieving, MD;  [facility];  Service: Gastroenterology;  Laterality: N/A;  10:45AM;ASA 1   ESOPHAGOGASTRODUODENOSCOPY (EGD) WITH PROPOFOL  N/A 09/27/2016   Dr. Shaaron: Small hiatal hernia, mild  Schatzki ring status post disruption, LA grade a esophagitis   ESOPHAGOGASTRODUODENOSCOPY (EGD) WITH PROPOFOL  N/A 10/18/2020   non-obstructing mild Schatzki ring, gastritis s/ biopsy. Negative H.pylori.   HEMOSTASIS CLIP PLACEMENT  10/06/2022   Procedure: HEMOSTASIS CLIP PLACEMENT;  Surgeon: Eartha Angelia Sieving, MD;  [facility];  Service: Gastroenterology;;   ILIAC ARTERY STENT Left 12/2007   IR ANGIOGRAM EXTREMITY BILATERAL  07/12/2021   IR ANGIOGRAM VISCERAL SELECTIVE  10/29/2020   IR ANGIOGRAM VISCERAL SELECTIVE  07/12/2021   IR ANGIOGRAM VISCERAL SELECTIVE  03/21/2022   IR AORTAGRAM ABDOMINAL SERIALOGRAM  03/21/2022   IR IVUS EACH ADDITIONAL NON CORONARY VESSEL  07/12/2021   IR IVUS EACH ADDITIONAL NON CORONARY VESSEL  03/21/2022   IR PTA NON CORO-LOWER EXTREM  03/21/2022   IR RADIOLOGIST EVAL & MGMT  10/26/2020   IR RADIOLOGIST EVAL & MGMT  11/10/2020   IR RADIOLOGIST EVAL & MGMT  02/09/2021   IR RADIOLOGIST EVAL & MGMT  06/16/2021   IR RADIOLOGIST EVAL & MGMT  08/15/2021   IR RADIOLOGIST EVAL & MGMT  10/18/2021   IR RADIOLOGIST EVAL & MGMT  01/11/2022   IR RADIOLOGIST EVAL & MGMT  03/02/2022   IR RADIOLOGIST EVAL & MGMT  05/11/2022   IR RADIOLOGIST EVAL & MGMT  08/18/2022   IR RADIOLOGIST EVAL & MGMT  10/18/2022   IR RADIOLOGIST EVAL & MGMT  05/21/2023   IR RADIOLOGIST EVAL & MGMT  08/29/2023   IR THROMBECT PRIM MECH INIT (INCLU) MOD SED  03/21/2022   IR THROMBECT SEC MECH MOD SED  07/12/2021   IR TRANSCATH PLC STENT 1ST ART NOT LE CV CAR VERT CAR  10/29/2020   IR TRANSCATH PLC STENT 1ST ART NOT LE CV CAR VERT CAR  07/12/2021   IR US  GUIDE VASC ACCESS RIGHT  10/29/2020   IR US  GUIDE VASC ACCESS RIGHT  07/12/2021   IR US  GUIDE VASC ACCESS RIGHT  03/21/2022   KNEE ARTHROSCOPY WITH MEDIAL MENISECTOMY Right 01/10/2018   Procedure: RIGHT KNEE ARTHROSCOPY WITH PARTIAL MEDIAL MENISECTOMY;  Surgeon: Margrette Taft BRAVO, MD;  Location: [facility];  Service: Orthopedics;  Laterality:  Right;   LAPAROSCOPIC APPENDECTOMY N/A 08/03/2022   Procedure: APPENDECTOMY LAPAROSCOPIC;  Surgeon: Rubin Calamity, MD;  Location: [facility] OR;  Service: General;  Laterality: N/A;   LEFT AND RIGHT HEART CATHETERIZATION WITH CORONARY ANGIOGRAM N/A 07/31/2014   Procedure: LEFT AND RIGHT HEART CATHETERIZATION WITH CORONARY ANGIOGRAM;  Surgeon: Lonni JONETTA Cash, MD;  Location: [facility] CATH LAB;  Service: Cardiovascular;  Laterality: N/A;   MALONEY DILATION N/A 09/27/2016   Procedure: AGAPITO DILATION;  Surgeon: Shaaron Lamar HERO, MD;  [facility];  Service: Endoscopy;  Laterality: N/A;   POLYPECTOMY  09/27/2016   Procedure: POLYPECTOMY;  Surgeon: Shaaron Lamar HERO, MD;  [facility];  Service: Endoscopy;;  colon   POLYPECTOMY  10/06/2022   Procedure: POLYPECTOMY;  Surgeon: Eartha Angelia Sieving, MD;  [facility];  Service: Gastroenterology;;   ROTATOR CUFF REPAIR Bilateral  SUBMUCOSAL LIFTING INJECTION  10/06/2022   Procedure: SUBMUCOSAL LIFTING INJECTION;  Surgeon: Eartha Angelia Sieving, MD;  [facility];  Service: Gastroenterology;;   TEE WITHOUT CARDIOVERSION N/A 07/07/2014   Procedure: TRANSESOPHAGEAL ECHOCARDIOGRAM (TEE);  Surgeon: Dorn JULIANNA Ross, MD;  [facility];  Service: Cardiology;  Laterality: N/A;   TEE WITHOUT CARDIOVERSION N/A 07/20/2014   Procedure: TRANSESOPHAGEAL ECHOCARDIOGRAM (TEE) WITH PROPOFOL ;  Surgeon: Dorn JULIANNA Ross, MD;  Location: [facility];  Service: Endoscopy;  Laterality: N/A;   TEE WITHOUT CARDIOVERSION N/A 10/07/2014   Procedure: TRANSESOPHAGEAL ECHOCARDIOGRAM (TEE);  Surgeon: Sudie VEAR Laine, MD;  Location: [facility] OR;  Service: Open Heart Surgery;  Laterality: N/A;   Social History   Tobacco Use   Smoking status: Former    Current packs/day: 0.00    Average packs/day: 0.5 packs/day for 40.0 years (20.0 ttl pk-yrs)    Types: Cigarettes    Start date: 05/1981    Quit date: 05/2021    Years since  quitting: 2.5   Smokeless tobacco: Never  Vaping Use   Vaping status: Never Used  Substance Use Topics   Alcohol use: Yes    Alcohol/week: 2.0 standard drinks of alcohol    Types: 2 Cans of beer per week    Comment: weekly   Drug use: No   Family History  Problem Relation Age of Onset   Hypertension Mother    Hypertension Father    Heart disease Father        before age 10   Other Father        varicose veins   COPD Father    Colon cancer Neg Hx    Celiac disease Neg Hx    Inflammatory bowel disease Neg Hx    Allergies  Allergen Reactions   Iodinated Contrast Media Anaphylaxis, Shortness Of Breath, Itching, Swelling and Other (See Comments)    Patient was given Omni 350 and suffered from itching, chest pain and shortness of breath. Patient was taken to ED after onset of reaction. 10/11/2021  MD zackowski noted pt had tongue and throat swelling, had to give pt epinephrine     Sulfa Antibiotics Nausea And Vomiting    ROS    Objective:    BP (!) 144/82   Pulse 64   Temp 98.5 F (36.9 C)   Ht 5' 6 (1.676 m)   Wt 229 lb (103.9 kg)   SpO2 99%   BMI 36.96 kg/m  BP Readings from Last 3 Encounters:  12/21/23 (!) 144/82  11/02/23 (!) 108/46  10/17/23 127/78   Wt Readings from Last 3 Encounters:  12/21/23 229 lb (103.9 kg)  11/02/23 235 lb (106.6 kg)  10/17/23 233 lb 9.6 oz (106 kg)    Physical Exam Vitals and nursing note reviewed.  Constitutional:      Appearance: Normal appearance.  HENT:     Head: Normocephalic.     Right Ear: Tympanic membrane, ear canal and external ear normal.     Left Ear: Tympanic membrane, ear canal and external ear normal.  Eyes:     Extraocular Movements: Extraocular movements intact.     Conjunctiva/sclera: Conjunctivae normal.     Pupils: Pupils are equal, round, and reactive to light.  Cardiovascular:     Rate and Rhythm: Normal rate and regular rhythm.     Heart sounds: Normal heart sounds.  Pulmonary:     Effort: Pulmonary  effort is normal.     Breath sounds: Normal breath sounds. No wheezing.  Musculoskeletal:     Right lower leg: No edema.     Left lower leg: No edema.  Neurological:     General: No focal deficit present.     Mental Status: She is alert and oriented to person, place, and time. Mental status is at baseline.  Psychiatric:        Mood and Affect: Mood normal.        Behavior: Behavior normal.        Thought Content: Thought content normal.        Judgment: Judgment normal.      No results found for any visits on 12/21/23.

## 2023-12-25 ENCOUNTER — Ambulatory Visit: Payer: Self-pay | Admitting: Family Medicine

## 2024-01-02 ENCOUNTER — Encounter: Admitting: Family Medicine

## 2024-01-07 ENCOUNTER — Other Ambulatory Visit: Payer: Self-pay | Admitting: Family Medicine

## 2024-01-07 ENCOUNTER — Ambulatory Visit (HOSPITAL_COMMUNITY)
Admission: RE | Admit: 2024-01-07 | Discharge: 2024-01-07 | Disposition: A | Discharge status: Home / Self Care | Source: Ambulatory Visit | Attending: Family Medicine | Admitting: Family Medicine

## 2024-01-07 ENCOUNTER — Other Ambulatory Visit: Payer: Self-pay

## 2024-01-07 ENCOUNTER — Encounter (HOSPITAL_COMMUNITY): Payer: Self-pay

## 2024-01-07 ENCOUNTER — Other Ambulatory Visit: Payer: Self-pay | Admitting: Cardiology

## 2024-01-07 ENCOUNTER — Other Ambulatory Visit: Payer: Self-pay | Admitting: Gastroenterology

## 2024-01-07 DIAGNOSIS — Z1231 Encounter for screening mammogram for malignant neoplasm of breast: Secondary | ICD-10-CM

## 2024-01-07 DIAGNOSIS — I10 Essential (primary) hypertension: Secondary | ICD-10-CM

## 2024-01-07 DIAGNOSIS — I5022 Chronic systolic (congestive) heart failure: Secondary | ICD-10-CM

## 2024-01-07 DIAGNOSIS — Z23 Encounter for immunization: Secondary | ICD-10-CM

## 2024-01-07 DIAGNOSIS — Z780 Asymptomatic menopausal state: Secondary | ICD-10-CM | POA: Diagnosis present

## 2024-01-07 DIAGNOSIS — K219 Gastro-esophageal reflux disease without esophagitis: Secondary | ICD-10-CM

## 2024-01-07 MED ORDER — COVID-19 MRNA VACC (MODERNA) 50 MCG/0.5ML IM SUSY: 0.5000 mL | PREFILLED_SYRINGE | Freq: Once | INTRAMUSCULAR | 0 refills | Status: AC

## 2024-01-08 NOTE — Telephone Encounter (Signed)
 This was sent locally to [facility] per patient request in July 2025.

## 2024-01-09 ENCOUNTER — Other Ambulatory Visit: Payer: Self-pay | Admitting: Student

## 2024-01-09 DIAGNOSIS — I739 Peripheral vascular disease, unspecified: Secondary | ICD-10-CM

## 2024-01-09 DIAGNOSIS — K551 Chronic vascular disorders of intestine: Secondary | ICD-10-CM

## 2024-01-09 MED ORDER — CLOPIDOGREL BISULFATE 75 MG PO TABS: 75.0000 mg | ORAL_TABLET | Freq: Every day | ORAL | 3 refills | Status: AC

## 2024-01-11 ENCOUNTER — Emergency Department (HOSPITAL_COMMUNITY)

## 2024-01-11 ENCOUNTER — Encounter (HOSPITAL_COMMUNITY): Payer: Self-pay

## 2024-01-11 ENCOUNTER — Ambulatory Visit: Admission: EM | Admit: 2024-01-11 | Discharge: 2024-01-11 | Disposition: A | Discharge status: Home / Self Care

## 2024-01-11 ENCOUNTER — Emergency Department (HOSPITAL_COMMUNITY)
Admission: EM | Admit: 2024-01-11 | Discharge: 2024-01-11 | Disposition: A | Discharge status: Home / Self Care | Source: Ambulatory Visit | Attending: Emergency Medicine | Admitting: Emergency Medicine

## 2024-01-11 ENCOUNTER — Other Ambulatory Visit: Payer: Self-pay

## 2024-01-11 DIAGNOSIS — R944 Abnormal results of kidney function studies: Secondary | ICD-10-CM | POA: Diagnosis not present

## 2024-01-11 DIAGNOSIS — Z79899 Other long term (current) drug therapy: Secondary | ICD-10-CM | POA: Diagnosis not present

## 2024-01-11 DIAGNOSIS — M79661 Pain in right lower leg: Secondary | ICD-10-CM

## 2024-01-11 DIAGNOSIS — Z7902 Long term (current) use of antithrombotics/antiplatelets: Secondary | ICD-10-CM | POA: Diagnosis not present

## 2024-01-11 DIAGNOSIS — M79604 Pain in right leg: Secondary | ICD-10-CM | POA: Insufficient documentation

## 2024-01-11 DIAGNOSIS — E860 Dehydration: Secondary | ICD-10-CM | POA: Insufficient documentation

## 2024-01-11 DIAGNOSIS — Z7982 Long term (current) use of aspirin: Secondary | ICD-10-CM | POA: Diagnosis not present

## 2024-01-11 DIAGNOSIS — M7989 Other specified soft tissue disorders: Secondary | ICD-10-CM

## 2024-01-11 LAB — BASIC METABOLIC PANEL WITH GFR
Anion gap: 11 (ref 5–15)
BUN: 14 mg/dL (ref 8–23)
CO2: 22 mmol/L (ref 22–32)
Calcium: 9.4 mg/dL (ref 8.9–10.3)
Chloride: 104 mmol/L (ref 98–111)
Creatinine, Ser: 1.26 mg/dL — ABNORMAL HIGH (ref 0.44–1.00)
GFR, Estimated: 47 mL/min — ABNORMAL LOW (ref >60)
Glucose, Bld: 93 mg/dL (ref 70–99)
Potassium: 3.9 mmol/L (ref 3.5–5.1)
Sodium: 137 mmol/L (ref 135–145)

## 2024-01-11 LAB — CBC WITH DIFFERENTIAL/PLATELET
Abs Immature Granulocytes: 0.01 K/uL (ref 0.00–0.07)
Basophils Absolute: 0.1 K/uL (ref 0.0–0.1)
Basophils Relative: 1 %
Eosinophils Absolute: 0.3 K/uL (ref 0.0–0.5)
Eosinophils Relative: 4 %
HCT: 39 % (ref 36.0–46.0)
Hemoglobin: 12.7 g/dL (ref 12.0–15.0)
Immature Granulocytes: 0 %
Lymphocytes Relative: 28 %
Lymphs Abs: 2.2 K/uL (ref 0.7–4.0)
MCH: 27.8 pg (ref 26.0–34.0)
MCHC: 32.6 g/dL (ref 30.0–36.0)
MCV: 85.3 fL (ref 80.0–100.0)
Monocytes Absolute: 0.9 K/uL (ref 0.1–1.0)
Monocytes Relative: 12 %
Neutro Abs: 4.1 K/uL (ref 1.7–7.7)
Neutrophils Relative %: 55 %
Platelets: 234 K/uL (ref 150–400)
RBC: 4.57 MIL/uL (ref 3.87–5.11)
RDW: 13.3 % (ref 11.5–15.5)
WBC: 7.6 K/uL (ref 4.0–10.5)
nRBC: 0 % (ref 0.0–0.2)

## 2024-01-11 LAB — CK: Total CK: 193 U/L (ref 38–234)

## 2024-01-11 MED ORDER — ACETAMINOPHEN 500 MG PO TABS
1000.0000 mg | ORAL_TABLET | Freq: Once | ORAL | Status: AC
  Administered 2024-01-11: 1000 mg via ORAL

## 2024-01-11 MED ORDER — LIDOCAINE 5 % EX PTCH
1.0000 | MEDICATED_PATCH | CUTANEOUS | Status: DC
  Administered 2024-01-11: 1 via TRANSDERMAL
  Filled 2024-01-11: qty 1

## 2024-01-11 MED ORDER — HYDROCODONE-ACETAMINOPHEN 5-325 MG PO TABS
1.0000 | ORAL_TABLET | Freq: Once | ORAL | Status: AC
  Administered 2024-01-11: 1 via ORAL
  Filled 2024-01-11: qty 1

## 2024-01-11 MED ORDER — SODIUM CHLORIDE 0.9 % IV BOLUS
1000.0000 mL | Freq: Once | INTRAVENOUS | Status: AC
  Administered 2024-01-11: 1000 mL via INTRAVENOUS

## 2024-01-11 NOTE — Discharge Instructions (Signed)
 You were seen in the emergency department for right calf pain and muscle cramps.  You had lab work that showed you to be mildly dehydrated and were given IV fluids and some pain medicine.  Please rest and drink plenty of fluids.  Anti-inflammatories and gentle stretching.  Follow-up with your primary care doctor.  Return to the Emergency Department if any worsening or concerning symptoms.

## 2024-01-11 NOTE — ED Provider Notes (Signed)
 [place] EMERGENCY DEPARTMENT AT [facility] Provider Note   CSN: [id number] Arrival date & time: 01/11/24  1655     Patient presents with: DVT   Michelle Patton is a 68 y.o. female.  She is complaining of acute onset of right calf pain.  She said she gets muscle spasms fairly frequently, runs in her family.  Had about 1 that started this morning and has not resolved with her typical management.  Unable to drive due to the pain and difficulty walking.  Went to urgent care where they referred her here due to concerns for DVT.  Chest pain or shortness of breath   The history is provided by the patient.  Leg Pain Location:  Leg Time since incident:  12 hours Injury: no   Leg location:  R lower leg Pain details:    Quality:  Cramping   Severity:  Severe   Onset quality:  Sudden   Timing:  Constant   Progression:  Unchanged Chronicity:  Recurrent Relieved by:  Nothing Worsened by:  Bearing weight Ineffective treatments:  Rest Associated symptoms: no fever, no numbness and no tingling        Prior to Admission medications   Medication Sig Start Date End Date Taking? Authorizing Provider  acetaminophen  (TYLENOL ) 325 MG tablet Take 650 mg by mouth every 6 (six) hours as needed for mild pain, moderate pain, fever or headache.    [provider]  aspirin  EC 81 MG tablet Take 1 tablet (81 mg total) by mouth daily. Swallow whole. 12/16/19   Alvan Dorn FALCON, MD  calcium  carbonate (OS-CAL - DOSED IN MG OF ELEMENTAL CALCIUM ) 1250 (500 Ca) MG tablet Take 1 tablet by mouth 2 (two) times daily with a meal.    [provider]  Cholecalciferol (VITAMIN D -3 PO) Take 1 capsule by mouth in the morning and at bedtime.    [provider]  clopidogrel  (PLAVIX ) 75 MG tablet Take 1 tablet (75 mg total) by mouth daily. 01/09/24   Matthews, Kacie Sue-Ellen, PA  dicyclomine  (BENTYL ) 10 MG capsule Take 1 capsule (10 mg total) by mouth every 12 (twelve) hours as  needed (abdominal pain). 08/06/23   Eartha Angelia Sieving, MD  estradiol  (ESTRACE ) 2 MG tablet Take 1 tablet (2 mg total) by mouth daily. 01/19/21   Levora Reyes SAUNDERS, MD  ezetimibe  (ZETIA ) 10 MG tablet TAKE 1 TABLET BY MOUTH DAILY 10/22/23   Alvan Dorn FALCON, MD  Flaxseed, Linseed, (FLAX SEED OIL PO) Take 1 capsule by mouth in the morning.    [provider]  fluticasone  (FLONASE ) 50 MCG/ACT nasal spray Place 1 spray into both nostrils 2 (two) times daily. 12/07/22   Hammersmith Vernell Norris, PA-C  losartan  (COZAAR ) 25 MG tablet TAKE 1 TABLET BY MOUTH DAILY 01/07/24   Levora Reyes SAUNDERS, MD  metoprolol  tartrate (LOPRESSOR ) 25 MG tablet TAKE 1 TABLET BY MOUTH TWICE  DAILY 01/08/24   Alvan Dorn FALCON, MD  Multiple Minerals (CALCIUM -MAGNESIUM -ZINC) TABS Take 1 tablet by mouth in the morning.    [provider]  NP THYROID  120 MG tablet Take 120 mg by mouth daily before breakfast. 01/11/22   [provider]  progesterone  (PROMETRIUM ) 200 MG capsule Take 2 capsules (400 mg total) by mouth daily. Patient taking differently: Take 400 mg by mouth at bedtime. 01/19/21 12/21/23  Levora Reyes SAUNDERS, MD  RABEprazole  (ACIPHEX ) 20 MG tablet Take 1 tablet (20 mg total) by mouth 2 (two) times daily before a  meal. 11/16/23   Eartha Flavors, Toribio, MD  rosuvastatin  (CRESTOR ) 40 MG tablet TAKE 1 TABLET BY MOUTH DAILY 09/27/23   Alvan Dorn FALCON, MD  varenicline  (CHANTIX ) 1 MG tablet Take 1 tablet (1 mg total) by mouth 2 (two) times daily. 08/20/23   Eartha Flavors Toribio, MD    Allergies: Iodinated contrast media and Sulfa antibiotics    Review of Systems  Constitutional:  Negative for fever.    Updated Vital Signs BP (!) 146/74 (BP Location: Right Arm)   Pulse 67   Temp 97.6 F (36.4 C) (Oral)   Resp 17   Ht 5' 6 (1.676 m)   Wt 103 kg   SpO2 96%   BMI 36.65 kg/m   Physical Exam Vitals and nursing note reviewed.  Constitutional:      General: She is not in acute  distress.    Appearance: Normal appearance. She is well-developed.  HENT:     Head: Normocephalic and atraumatic.  Eyes:     Conjunctiva/sclera: Conjunctivae normal.  Cardiovascular:     Rate and Rhythm: Normal rate and regular rhythm.     Heart sounds: No murmur heard. Pulmonary:     Effort: Pulmonary effort is normal. No respiratory distress.     Breath sounds: Normal breath sounds. No stridor. No wheezing.  Abdominal:     Palpations: Abdomen is soft.     Tenderness: There is no abdominal tenderness. There is no guarding or rebound.  Musculoskeletal:        General: Tenderness present. No deformity.     Cervical back: Neck supple.     Comments: She has tenderness of her right calf.  There is no palpable cords and no overlying skin findings.  Distal pulses motor and sensation intact  Skin:    General: Skin is warm and dry.  Neurological:     General: No focal deficit present.     Mental Status: She is alert.     GCS: GCS eye subscore is 4. GCS verbal subscore is 5. GCS motor subscore is 6.     (all labs ordered are listed, but only abnormal results are displayed) Labs Reviewed  BASIC METABOLIC PANEL WITH GFR - Abnormal; Notable for the following components:      Result Value   Creatinine, Ser 1.26 (*)    GFR, Estimated 47 (*)    All other components within normal limits  CBC WITH DIFFERENTIAL/PLATELET  CK    EKG: None  Radiology: US  Venous Img Lower Right (DVT Study) Result Date: 01/11/2024 CLINICAL DATA:  edema EXAM: RIGHT LOWER EXTREMITY VENOUS DOPPLER ULTRASOUND TECHNIQUE: Gray-scale sonography with graded compression, as well as color Doppler and duplex ultrasound were performed to evaluate the lower extremity deep venous systems from the level of the common femoral vein and including the common femoral, femoral, profunda femoral, popliteal and calf veins including the posterior tibial, peroneal and gastrocnemius veins when visible. The superficial great saphenous  vein was also interrogated. Spectral Doppler was utilized to evaluate flow at rest and with distal augmentation maneuvers in the common femoral, femoral and popliteal veins. COMPARISON:  None Available. FINDINGS: Contralateral Common Femoral Vein: Respiratory phasicity is normal and symmetric with the symptomatic side. No evidence of thrombus. Normal compressibility. Common Femoral Vein: No evidence of thrombus. Normal compressibility, respiratory phasicity and response to augmentation. Saphenofemoral Junction: No evidence of thrombus. Normal compressibility and flow on color Doppler imaging. Profunda Femoral Vein: No evidence of thrombus. Normal compressibility and flow on color Doppler  imaging. Femoral Vein: No evidence of thrombus. Normal compressibility, respiratory phasicity and response to augmentation. Popliteal Vein: No evidence of thrombus. Normal compressibility, respiratory phasicity and response to augmentation. Calf Veins: No evidence of thrombus. Normal compressibility and flow on color Doppler imaging. Superficial Great Saphenous Vein: No evidence of thrombus. Normal compressibility. Other Findings:  None. IMPRESSION: Negative for deep venous thrombosis in the right leg. Electronically Signed   By: Rogelia Myers M.D.   On: 01/11/2024 17:57     Procedures   Medications Ordered in the ED  lidocaine  (LIDODERM ) 5 % 1 patch (1 patch Transdermal Patch Applied 01/11/24 1930)  HYDROcodone -acetaminophen  (NORCO/VICODIN) 5-325 MG per tablet 1 tablet (1 tablet Oral Given 01/11/24 1930)                                    Medical Decision Making Amount and/or Complexity of Data Reviewed Labs: ordered.  Risk Prescription drug management.   This patient complains of right calf pain; this involves an extensive number of treatment Options and is a complaint that carries with it a high risk of complications and morbidity. The differential includes musculoskeletal pain, Terrier, DVT, cellulitis,  Baker's cyst  I ordered, reviewed and interpreted labs, which included CBC normal, chemistries with mild elevation of creatinine, CK normal I ordered medication oral pain medicine IV fluids and reviewed PMP when indicated. I ordered imaging studies which included duplex and I independently    visualized and interpreted imaging which showed no acute DVT Previous records obtained and reviewed in epic including recent PCP notes  Cardiac monitoring reviewed, sinus rhythm Social determinants considered, no significant barriers Critical Interventions: None  After the interventions stated above, I reevaluated the patient and found patient to be feeling better and able to safely ambulate Admission and further testing considered, no indications for admission at this time.  Will treat symptomatically with over-the-counter medications and recommended close follow-up with PCP.  Return instructions discussed.      Final diagnoses:  Right calf pain  Dehydration    ED Discharge Orders     None          Towana Ozell BROCKS, MD 01/12/24 [id number]

## 2024-01-11 NOTE — ED Notes (Signed)
 Patient is being discharged from the Urgent Care and sent to the Emergency Department via POV . Per Vernell Hammersmith, PA, patient is in need of higher level of care due to possible DVT. Patient is aware and verbalizes understanding of plan of care.  Vitals:   01/11/24 1551  BP: 126/81  Pulse: 75  Resp: 18  Temp: 97.7 F (36.5 C)  SpO2: 97%

## 2024-01-11 NOTE — ED Provider Notes (Signed)
 [facility]    CSN: [id number] Arrival date & time: 01/11/24  1540      History   Chief Complaint Chief Complaint  Patient presents with   Leg Pain    HPI Michelle Patton is a 69 y.o. female.   Patient presenting today with new onset severe right posterior calf cramping and pain, swelling that started in the middle of the night.  She states she has a frequent history of calf cramps in the night and is typically able to walk them out.  She states she has tried heating pads, magnesium , CBD balm, massage, and lots of fluids all with no relief.  States the pain has been severe all day and she can barely flex her foot or walk and to do so because of severe pain.  She denies chest pain, shortness of breath, palpitations, injury to the area, fevers, chills, significant redness to the area.  Past medical history significant for DVT, congestive heart failure, hyperlipidemia, hypertension, peripheral artery disease, history of MI.  Of note, patient is on Plavix  and aspirin  daily.    Past Medical History:  Diagnosis Date   Allergy    Aortic regurgitation 07/20/2014   Aortic stenosis, severe 07/20/2014   Arthritis    CHF (congestive heart failure) (HCC)    DVT (deep venous thrombosis) (HCC)    Family history of adverse reaction to anesthesia    Reports father deliurm in his 60's with CABG   GERD (gastroesophageal reflux disease)    History of pneumonia    Hx of repair of rotator cuff    R and L rotator cuff repair   Hyperlipidemia    Hypertension    Insomnia, unspecified    patient stated d/t menopause   Mesenteric ischemia (HCC)    Muscle weakness (generalized)    Peripheral artery disease (HCC)    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   Redo sternotomy for 21 mm Medtronic Freestyle porcine aortic root graft w/ reimplantation of left main and right coronary arteries   Sleep apnea    diagnosed multiple years ago at  [facility] aortic stenosis, congenital - s/p repair during childhood     Patient Active Problem List   Diagnosis Date Noted   Anxiety 12/21/2023   Depressive disorder 12/21/2023   Difficulty sleeping 12/21/2023   History of myocardial infarction 12/21/2023   Hot flashes 12/21/2023   Iron deficiency 12/21/2023   Menopausal and female climacteric states 12/21/2023   Vitamin D  deficiency 12/21/2023   Leukocytosis 03/15/2022   Mixed hyperlipidemia 03/15/2022   Acquired hypothyroidism 03/15/2022   Hypoalbuminemia due to protein-calorie malnutrition (HCC) 03/15/2022   Body mass index (BMI) 34.0-34.9, adult 03/15/2022   Allergy, drug 01/13/2022   Prolapsed internal hemorrhoids, grade 3 12/06/2021   Left lower quadrant abdominal pain 12/06/2021   Acute anaphylaxis 10/11/2021   Mesenteric ischemia (HCC) 07/12/2021   Chronic mesenteric ischemia (HCC) 02/22/2021   Elevated LFTs 10/05/2020   Class 2 severe obesity due to excess calories with serious comorbidity and body mass index (BMI) of 36.0 to 36.9 in adult [facility]) 08/09/2020   PAD (peripheral artery disease) (HCC) 08/09/2020   Aortic valve prosthesis present 08/09/2020   Chronic systolic congestive heart failure (HCC) 08/09/2020   OSA and COPD overlap syndrome (HCC) 08/09/2020   s/p right carpal tunnel release 03/02/20 04/06/2020   Closed displaced fracture of proximal phalanx of lesser toe of right foot 12/31/2019   Carpal  tunnel syndrome of right wrist 12/05/2019   Nondisplaced fracture of fifth right metatarsal bone with routine healing 10/21/2019   Low back pain without sciatica 01/20/2019   Pain of upper abdomen 01/14/2019   Fatigue 12/09/2018   Breast cancer screening 12/09/2018   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 left wrist 10/23/2018   S/P right knee arthroscopy 01/10/18 01/18/2018   Chondromalacia, patella, right    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    History of  colonic polyps 12/28/2016   Right flank pain 08/10/2016   Esophageal dysphagia 08/10/2016   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   Supravalvular aortic stenosis, congenital - s/p repair during childhood    Essential hypertension    GERD (gastroesophageal reflux disease)    Aortic stenosis, severe 07/20/2014   Aortic regurgitation 07/20/2014   Leg cramps 09/26/2012   Occlusion and stenosis of carotid artery without mention of cerebral infarction 07/10/2012   Peripheral vascular disease, unspecified (HCC) 06/12/2012   Carotid artery bruit 06/12/2012    Past Surgical History:  Procedure Laterality Date   ABDOMINAL AORTAGRAM  06/24/2012   ABDOMINAL AORTAGRAM N/A 06/24/2012   Procedure: ABDOMINAL EZELLA;  Surgeon: Lonni GORMAN Blade, MD;  Location: [facility] CATH [facility];  Service: Cardiovascular;  Laterality: N/A;   AORTIC VALVE REPLACEMENT N/A 10/07/2014   Procedure: REDO AORTIC VALVE REPLACEMENT (AVR);  Surgeon: Sudie VEAR Laine, MD;  Location: [facility] OR;  Service: Open Heart Surgery;  Laterality: N/A;   ASCENDING AORTIC ROOT REPLACEMENT N/A 10/07/2014   Procedure: ASCENDING AORTIC ROOT REPLACEMENT;  Surgeon: Sudie VEAR Laine, MD;  Location: [facility] OR;  Service: Open Heart Surgery;  Laterality: N/A;   BIOPSY  09/27/2016   Procedure: BIOPSY;  Surgeon: Shaaron Lamar HERO, MD;  Location: [facility];  Service: Endoscopy;;  colon   BIOPSY  10/18/2020   Procedure: BIOPSY;  Surgeon: Cindie Carlin POUR, DO;  Location: [facility];  Service: Endoscopy;;   BREAST REDUCTION SURGERY Bilateral 01/21/2018   Procedure: BREAST REDUCTION WITH LIPOSUCTION;  Surgeon: Marcus Lung, MD;  Location: [facility];  Service: Plastics;  Laterality: Bilateral;   CARDIAC VALVE SURGERY  1968   CARPAL TUNNEL RELEASE Right 03/02/2020   Procedure: CARPAL TUNNEL RELEASE;  Surgeon: Margrette Taft BRAVO, MD;  Location: [facility];  Service: Orthopedics;  Laterality: Right;    CATARACT EXTRACTION W/PHACO Left 05/30/2019   Procedure: CATARACT EXTRACTION PHACO AND INTRAOCULAR LENS PLACEMENT (IOC) (CDE: 4.94  );  Surgeon: Harrie Agent, MD;  Location: [facility];  Service: Ophthalmology;  Laterality: Left;   CATARACT EXTRACTION W/PHACO Right 06/16/2019   Procedure: CATARACT EXTRACTION PHACO AND INTRAOCULAR LENS PLACEMENT (IOC);  Surgeon: Harrie Agent, MD;  Location: [facility];  Service: Ophthalmology;  Laterality: Right;  CDE: 4.64   CERVICAL FUSION     CHOLECYSTECTOMY     COLONOSCOPY N/A 11/02/2023   Procedure: COLONOSCOPY;  Surgeon: Eartha Angelia Sieving, MD;  Location: [facility];  Service: Gastroenterology;  Laterality: N/A;  11:30 am, asa 1/2   COLONOSCOPY WITH PROPOFOL  N/A 09/27/2016   Dr. Shaaron: Diverticulosis, several tubular adenomas removed ranging 4 to 7 mm in size, internal grade 1 hemorrhoids, terminal ileum normal, segmental biopsies negative for microscopic colitis.  Next colonoscopy June 2021   COLONOSCOPY WITH PROPOFOL  N/A 05/06/2020   internal hemorrhoids, sigmoid diverticulosis. Surveillance colonoscopy due in 2027   COLONOSCOPY WITH PROPOFOL  N/A 10/06/2022   Procedure: COLONOSCOPY WITH PROPOFOL ;  Surgeon: Eartha Angelia Sieving,  MD;  [facility];  Service: Gastroenterology;  Laterality: N/A;  10:45AM;ASA 1   ESOPHAGOGASTRODUODENOSCOPY (EGD) WITH PROPOFOL  N/A 09/27/2016   Dr. Shaaron: Small hiatal hernia, mild Schatzki ring status post disruption, LA grade a esophagitis   ESOPHAGOGASTRODUODENOSCOPY (EGD) WITH PROPOFOL  N/A 10/18/2020   non-obstructing mild Schatzki ring, gastritis s/ biopsy. Negative H.pylori.   HEMOSTASIS CLIP PLACEMENT  10/06/2022   Procedure: HEMOSTASIS CLIP PLACEMENT;  Surgeon: Eartha Angelia Sieving, MD;  [facility]: [facility];  Service: Gastroenterology;;   ILIAC ARTERY STENT Left 12/2007   IR ANGIOGRAM EXTREMITY BILATERAL  07/12/2021   IR ANGIOGRAM VISCERAL SELECTIVE  10/29/2020   IR ANGIOGRAM VISCERAL  SELECTIVE  07/12/2021   IR ANGIOGRAM VISCERAL SELECTIVE  03/21/2022   IR AORTAGRAM ABDOMINAL SERIALOGRAM  03/21/2022   IR IVUS EACH ADDITIONAL NON CORONARY VESSEL  07/12/2021   IR IVUS EACH ADDITIONAL NON CORONARY VESSEL  03/21/2022   IR PTA NON CORO-LOWER EXTREM  03/21/2022   IR RADIOLOGIST EVAL & MGMT  10/26/2020   IR RADIOLOGIST EVAL & MGMT  11/10/2020   IR RADIOLOGIST EVAL & MGMT  02/09/2021   IR RADIOLOGIST EVAL & MGMT  06/16/2021   IR RADIOLOGIST EVAL & MGMT  08/15/2021   IR RADIOLOGIST EVAL & MGMT  10/18/2021   IR RADIOLOGIST EVAL & MGMT  01/11/2022   IR RADIOLOGIST EVAL & MGMT  03/02/2022   IR RADIOLOGIST EVAL & MGMT  05/11/2022   IR RADIOLOGIST EVAL & MGMT  08/18/2022   IR RADIOLOGIST EVAL & MGMT  10/18/2022   IR RADIOLOGIST EVAL & MGMT  05/21/2023   IR RADIOLOGIST EVAL & MGMT  08/29/2023   IR THROMBECT PRIM MECH INIT (INCLU) MOD SED  03/21/2022   IR THROMBECT SEC MECH MOD SED  07/12/2021   IR TRANSCATH PLC STENT 1ST ART NOT LE CV CAR VERT CAR  10/29/2020   IR TRANSCATH PLC STENT 1ST ART NOT LE CV CAR VERT CAR  07/12/2021   IR US  GUIDE VASC ACCESS RIGHT  10/29/2020   IR US  GUIDE VASC ACCESS RIGHT  07/12/2021   IR US  GUIDE VASC ACCESS RIGHT  03/21/2022   KNEE ARTHROSCOPY WITH MEDIAL MENISECTOMY Right 01/10/2018   Procedure: RIGHT KNEE ARTHROSCOPY WITH PARTIAL MEDIAL MENISECTOMY;  Surgeon: Margrette Taft BRAVO, MD;  Location: [facility];  Service: Orthopedics;  Laterality: Right;   LAPAROSCOPIC APPENDECTOMY N/A 08/03/2022   Procedure: APPENDECTOMY LAPAROSCOPIC;  Surgeon: Rubin Calamity, MD;  Location: [facility] OR;  Service: General;  Laterality: N/A;   LEFT AND RIGHT HEART CATHETERIZATION WITH CORONARY ANGIOGRAM N/A 07/31/2014   Procedure: LEFT AND RIGHT HEART CATHETERIZATION WITH CORONARY ANGIOGRAM;  Surgeon: Lonni JONETTA Cash, MD;  Location: [facility] CATH LAB;  Service: Cardiovascular;  Laterality: N/A;   MALONEY DILATION N/A 09/27/2016   Procedure: AGAPITO DILATION;  Surgeon: Shaaron Lamar HERO, MD;   [facility];  Service: Endoscopy;  Laterality: N/A;   POLYPECTOMY  09/27/2016   Procedure: POLYPECTOMY;  Surgeon: Shaaron Lamar HERO, MD;  [facility];  Service: Endoscopy;;  colon   POLYPECTOMY  10/06/2022   Procedure: POLYPECTOMY;  Surgeon: Eartha Angelia Sieving, MD;  [facility];  Service: Gastroenterology;;   ROTATOR CUFF REPAIR Bilateral    SUBMUCOSAL LIFTING INJECTION  10/06/2022   Procedure: SUBMUCOSAL LIFTING INJECTION;  Surgeon: Eartha Angelia Sieving, MD;  [facility];  Service: Gastroenterology;;   TEE WITHOUT CARDIOVERSION N/A 07/07/2014   Procedure: TRANSESOPHAGEAL ECHOCARDIOGRAM (TEE);  Surgeon: Dorn JULIANNA Ross, MD;  [facility];  Service: Cardiology;  Laterality: N/A;   TEE WITHOUT CARDIOVERSION N/A 07/20/2014   Procedure: TRANSESOPHAGEAL ECHOCARDIOGRAM (TEE) WITH PROPOFOL ;  Surgeon: Dorn JULIANNA Ross, MD;  Location: [facility];  Service: Endoscopy;  Laterality: N/A;   TEE WITHOUT CARDIOVERSION N/A 10/07/2014   Procedure: TRANSESOPHAGEAL ECHOCARDIOGRAM (TEE);  Surgeon: Sudie VEAR Laine, MD;  Location[facility] OR;  Service: Open Heart Surgery;  Laterality: N/A;    OB History   No obstetric history on file.      Home Medications    Prior to Admission medications   Medication Sig Start Date End Date Taking? Authorizing Provider  calcium  carbonate (OS-CAL - DOSED IN MG OF ELEMENTAL CALCIUM ) 1250 (500 Ca) MG tablet Take 1 tablet by mouth 2 (two) times daily with a meal.   Yes [provider]  acetaminophen  (TYLENOL ) 325 MG tablet Take 650 mg by mouth every 6 (six) hours as needed for mild pain, moderate pain, fever or headache.    [provider]  aspirin  EC 81 MG tablet Take 1 tablet (81 mg total) by mouth daily. Swallow whole. 12/16/19   Ross Dorn JULIANNA, MD  Cholecalciferol (VITAMIN D -3 PO) Take 1 capsule by mouth in the morning and at bedtime.    [provider]  clopidogrel  (PLAVIX ) 75 MG  tablet Take 1 tablet (75 mg total) by mouth daily. 01/09/24   Matthews, Kacie Sue-Ellen, PA  dicyclomine  (BENTYL ) 10 MG capsule Take 1 capsule (10 mg total) by mouth every 12 (twelve) hours as needed (abdominal pain). 08/06/23   Eartha Angelia Sieving, MD  estradiol  (ESTRACE ) 2 MG tablet Take 1 tablet (2 mg total) by mouth daily. 01/19/21   Levora Reyes SAUNDERS, MD  ezetimibe  (ZETIA ) 10 MG tablet TAKE 1 TABLET BY MOUTH DAILY 10/22/23   Ross Dorn JULIANNA, MD  Flaxseed, Linseed, (FLAX SEED OIL PO) Take 1 capsule by mouth in the morning.    [provider]  fluticasone  (FLONASE ) 50 MCG/ACT nasal spray Place 1 spray into both nostrils 2 (two) times daily. 12/07/22   Stuart Vernell Norris, PA-C  losartan  (COZAAR ) 25 MG tablet TAKE 1 TABLET BY MOUTH DAILY 01/07/24   Greene, Jeffrey R, MD  metoprolol  tartrate (LOPRESSOR ) 25 MG tablet TAKE 1 TABLET BY MOUTH TWICE  DAILY 01/08/24   Ross Dorn JULIANNA, MD  Multiple Minerals (CALCIUM -MAGNESIUM -ZINC) TABS Take 1 tablet by mouth in the morning.    [provider]  NP THYROID  120 MG tablet Take 120 mg by mouth daily before breakfast. 01/11/22   [provider]  progesterone  (PROMETRIUM ) 200 MG capsule Take 2 capsules (400 mg total) by mouth daily. Patient taking differently: Take 400 mg by mouth at bedtime. 01/19/21 12/21/23  Levora Reyes SAUNDERS, MD  RABEprazole  (ACIPHEX ) 20 MG tablet Take 1 tablet (20 mg total) by mouth 2 (two) times daily before a meal. 11/16/23   Eartha Angelia, Sieving, MD  rosuvastatin  (CRESTOR ) 40 MG tablet TAKE 1 TABLET BY MOUTH DAILY 09/27/23   Ross Dorn JULIANNA, MD  varenicline  (CHANTIX ) 1 MG tablet Take 1 tablet (1 mg total) by mouth 2 (two) times daily. 08/20/23   Eartha Angelia Sieving, MD    Family History Family History  Problem Relation Age of Onset   Hypertension Mother    Hypertension Father    Heart disease Father        before age 51   Other Father        varicose veins   COPD Father    Colon cancer  Neg Hx    Celiac disease Neg Hx    Inflammatory bowel disease Neg Hx     Social History Social History   Tobacco Use   Smoking status: Former    Current packs/day: 0.00    Average packs/day: 0.5 packs/day for 40.0 years (20.0 ttl pk-yrs)    Types: Cigarettes    Start date: 05/1981    Quit date: 05/2021    Years since quitting: 2.6   Smokeless tobacco: Never  Vaping Use   Vaping status: Never Used  Substance Use Topics   Alcohol use: Yes    Alcohol/week: 2.0 standard drinks of alcohol    Types: 2 Cans of beer per week    Comment: weekly   Drug use: No     Allergies   Iodinated contrast media and Sulfa antibiotics   Review of Systems Review of Systems PER HPI  Physical Exam Triage Vital Signs ED Triage Vitals  Encounter Vitals Group     BP 01/11/24 1551 126/81     Girls Systolic BP Percentile --      Girls Diastolic BP Percentile --      Boys Systolic BP Percentile --      Boys Diastolic BP Percentile --      Pulse Rate 01/11/24 1551 75     Resp 01/11/24 1551 18     Temp 01/11/24 1551 97.7 F (36.5 C)     Temp Source 01/11/24 1551 Oral     SpO2 01/11/24 1551 97 %     Weight --      Height --      Head Circumference --      Peak Flow --      Pain Score 01/11/24 1547 9     Pain Loc --      Pain Education --      Exclude from Growth Chart --    No data found.  Updated Vital Signs BP 126/81 (BP Location: Left Arm)   Pulse 75   Temp 97.7 F (36.5 C) (Oral)   Resp 18   SpO2 97%   Visual Acuity Right Eye Distance:   Left Eye Distance:   Bilateral Distance:    Right Eye Near:   Left Eye Near:    Bilateral Near:     Physical Exam Vitals and nursing note reviewed.  Constitutional:      Appearance: Normal appearance. She is not ill-appearing.  HENT:     Head: Atraumatic.  Eyes:     Extraocular Movements: Extraocular movements intact.     Conjunctiva/sclera: Conjunctivae normal.  Cardiovascular:     Rate and Rhythm: Normal rate and regular  rhythm.     Heart sounds: Normal heart sounds.  Pulmonary:     Effort: Pulmonary effort is normal.     Breath sounds: Normal breath sounds.  Musculoskeletal:        General: Swelling and tenderness present. Normal range of motion.     Cervical back: Normal range of motion and neck supple.     Comments: Palpable firm mass to the mid posterior right calf region, severely tender to palpation.  Severe pain to this area with flexion of the right foot  Skin:    General: Skin is warm and dry.     Findings: No erythema.  Neurological:     Mental Status: She is alert and oriented to person, place, and time.     Comments: Right lower extremity neurovascularly intact  Psychiatric:  Mood and Affect: Mood normal.        Thought Content: Thought content normal.        Judgment: Judgment normal.      UC Treatments / Results  Labs (all labs ordered are listed, but only abnormal results are displayed) Labs Reviewed - No data to display  EKG   Radiology No results found.  Procedures Procedures (including critical care time)  Medications Ordered in UC Medications  acetaminophen  (TYLENOL ) tablet 1,000 mg (1,000 mg Oral Given 01/11/24 1641)    Initial Impression / Assessment and Plan / UC Course  I have reviewed the triage vital signs and the nursing notes.  Pertinent labs & imaging results that were available during my care of the patient were reviewed by me and considered in my medical decision making (see chart for details).     Exam and history significantly concerning for DVT despite use of aspirin  and Plavix .  Unable to obtain outpatient ultrasound until at least Monday as it is currently after hours leading into the weekend so recommended patient to go to the emergency department for immediate evaluation and rule out.  She is readily agreeable to this and has a significant other with her to drive her, declines EMS transport and hemodynamically stable for private vehicle  transport.  Final Clinical Impressions(s) / UC Diagnoses   Final diagnoses:  Right calf pain  Calf swelling     Discharge Instructions      I recommend that you go directly to the emergency department for further evaluation of your severe calf pain and swelling to rule out a blood clot or other potentially more emergent causes of your symptoms.  We have given you some Tylenol  to hopefully help ease your pain is much as possible in the meantime    ED Prescriptions   None    PDMP not reviewed this encounter.   Angeliki, Mates, PA-C 01/11/24 1735

## 2024-01-11 NOTE — ED Triage Notes (Signed)
 Pt presents to UC for c/o right calf cramp since this morning. Pt states she regularly gets leg cramps but this is the worst she's experienced. She tried elevating the extremity, a cbd bomb, drinking more water , and taking magnesium  w/o relief. Pt reports she recently started taking calcium .

## 2024-01-11 NOTE — ED Triage Notes (Signed)
 Pt stated that she was sent by UC to rule out DVT of her right lower leg. Pt complaining of pain and limited ROM

## 2024-01-11 NOTE — Discharge Instructions (Signed)
 I recommend that you go directly to the emergency department for further evaluation of your severe calf pain and swelling to rule out a blood clot or other potentially more emergent causes of your symptoms.  We have given you some Tylenol  to hopefully help ease your pain is much as possible in the meantime

## 2024-01-13 ENCOUNTER — Other Ambulatory Visit: Payer: Self-pay | Admitting: Gastroenterology

## 2024-01-13 DIAGNOSIS — K219 Gastro-esophageal reflux disease without esophagitis: Secondary | ICD-10-CM

## 2024-01-14 NOTE — Telephone Encounter (Signed)
 Patient no longer gets trough [facility]. She gets this locally.

## 2024-02-06 ENCOUNTER — Ambulatory Visit

## 2024-02-06 ENCOUNTER — Encounter (INDEPENDENT_AMBULATORY_CARE_PROVIDER_SITE_OTHER): Payer: Self-pay | Admitting: Gastroenterology

## 2024-02-06 VITALS — Ht 66.0 in | Wt 227.0 lb

## 2024-02-06 DIAGNOSIS — Z0000 Encounter for general adult medical examination without abnormal findings: Secondary | ICD-10-CM

## 2024-02-06 NOTE — Progress Notes (Cosign Needed Addendum)
 Subjective:   Michelle Patton is a 68 y.o. who presents for a Medicare Wellness preventive visit.  As a reminder, Annual Wellness Visits don't include a physical exam, and some assessments may be limited, especially if this visit is performed virtually. We may recommend an in-person follow-up visit with your provider if needed.  Visit Complete: Virtual I connected with  Michelle Patton on 02/06/24 by a audio enabled telemedicine application and verified that I am speaking with the correct person using two identifiers.  Patient Location: Home  Provider Location: Home Office  I discussed the limitations of evaluation and management by telemedicine. The patient expressed understanding and agreed to proceed.  Vital Signs: Because this visit was a virtual/telehealth visit, some criteria may be missing or patient reported. Any vitals not documented were not able to be obtained and vitals that have been documented are patient reported.  VideoError- Librarian, academic were attempted between this provider and patient, however failed, due to patient having technical difficulties OR patient did not have access to video capability.  We continued and completed visit with audio only.   Persons Participating in Visit: Patient.  AWV Questionnaire: No: Patient Medicare AWV questionnaire was not completed prior to this visit.  Cardiac Risk Factors include: advanced age (>66men, >34 women);hypertension;smoking/ tobacco exposure     Objective:    Today's Vitals   02/06/24 0815  Weight: 227 lb (103 kg)  Height: 5' 6 (1.676 m)   Body mass index is 36.64 kg/m.     02/06/2024    8:18 AM 01/11/2024    5:09 PM 11/02/2023    9:16 AM 10/06/2022    9:40 AM 07/26/2022    3:25 PM 03/14/2022    4:09 PM 10/12/2021    5:18 AM  Advanced Directives  Does Patient Have a Medical Advance Directive? No No Yes Yes Yes No   Type of Advance Directive   Living will Healthcare Power of  [place];Living will Healthcare Power of [place];Living will    Copy of Healthcare Power of Attorney in Chart?     No - copy requested    Would patient like information on creating a medical advance directive? Yes (MAU/Ambulatory/Procedural Areas - Information given)     No - Patient declined No - Patient declined    Current Medications (verified) Outpatient Encounter Medications as of 02/06/2024  Medication Sig   acetaminophen  (TYLENOL ) 325 MG tablet Take 650 mg by mouth every 6 (six) hours as needed for mild pain, moderate pain, fever or headache.   aspirin  EC 81 MG tablet Take 1 tablet (81 mg total) by mouth daily. Swallow whole.   calcium  carbonate (OS-CAL - DOSED IN MG OF ELEMENTAL CALCIUM ) 1250 (500 Ca) MG tablet Take 1 tablet by mouth 2 (two) times daily with a meal.   Cholecalciferol (VITAMIN D -3 PO) Take 1 capsule by mouth in the morning and at bedtime.   clopidogrel  (PLAVIX ) 75 MG tablet Take 1 tablet (75 mg total) by mouth daily.   dicyclomine  (BENTYL ) 10 MG capsule Take 1 capsule (10 mg total) by mouth every 12 (twelve) hours as needed (abdominal pain).   ezetimibe  (ZETIA ) 10 MG tablet TAKE 1 TABLET BY MOUTH DAILY   Flaxseed, Linseed, (FLAX SEED OIL PO) Take 1 capsule by mouth in the morning.   fluticasone  (FLONASE ) 50 MCG/ACT nasal spray Place 1 spray into both nostrils 2 (two) times daily.   losartan  (COZAAR ) 25 MG tablet TAKE 1 TABLET BY MOUTH DAILY  metoprolol  tartrate (LOPRESSOR ) 25 MG tablet TAKE 1 TABLET BY MOUTH TWICE  DAILY   Multiple Minerals (CALCIUM -MAGNESIUM -ZINC) TABS Take 1 tablet by mouth in the morning.   progesterone  (PROMETRIUM ) 200 MG capsule Take 2 capsules (400 mg total) by mouth daily. (Patient taking differently: Take 400 mg by mouth at bedtime.)   RABEprazole  (ACIPHEX ) 20 MG tablet Take 1 tablet (20 mg total) by mouth 2 (two) times daily before a meal.   rosuvastatin  (CRESTOR ) 40 MG tablet TAKE 1 TABLET BY MOUTH DAILY   varenicline  (CHANTIX ) 1 MG tablet  Take 1 tablet (1 mg total) by mouth 2 (two) times daily.   estradiol  (ESTRACE ) 2 MG tablet Take 1 tablet (2 mg total) by mouth daily. (Patient not taking: Reported on 02/06/2024)   NP THYROID  120 MG tablet Take 120 mg by mouth daily before breakfast. (Patient not taking: Reported on 02/06/2024)   No facility-administered encounter medications on file as of 02/06/2024.    Allergies (verified) Iodinated contrast media and Sulfa antibiotics   History: Past Medical History:  Diagnosis Date   Allergy    Aortic regurgitation 07/20/2014   Aortic stenosis, severe 07/20/2014   Arthritis    CHF (congestive heart failure) (HCC)    DVT (deep venous thrombosis) (HCC)    Family history of adverse reaction to anesthesia    Reports father deliurm in his 17's with CABG   GERD (gastroesophageal reflux disease)    History of pneumonia    Hx of repair of rotator cuff    R and L rotator cuff repair   Hyperlipidemia    Hypertension    Insomnia, unspecified    patient stated d/t menopause   Mesenteric ischemia    Muscle weakness (generalized)    Peripheral artery disease    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   Redo sternotomy for 21 mm Medtronic Freestyle porcine aortic root graft w/ reimplantation of left main and right coronary arteries   Sleep apnea    diagnosed multiple years ago at [facility] aortic stenosis, congenital - s/p repair during childhood    Past Surgical History:  Procedure Laterality Date   ABDOMINAL AORTAGRAM  06/24/2012   ABDOMINAL AORTAGRAM N/A 06/24/2012   Procedure: ABDOMINAL EZELLA;  Surgeon: Lonni GORMAN Blade, MD;  Location: [facility] CATH LAB;  Service: Cardiovascular;  Laterality: N/A;   AORTIC VALVE REPLACEMENT N/A 10/07/2014   Procedure: REDO AORTIC VALVE REPLACEMENT (AVR);  Surgeon: Sudie VEAR Laine, MD;  Location: [facility] OR;  Service: Open Heart Surgery;  Laterality: N/A;   ASCENDING AORTIC ROOT  REPLACEMENT N/A 10/07/2014   Procedure: ASCENDING AORTIC ROOT REPLACEMENT;  Surgeon: Sudie VEAR Laine, MD;  Location: [facility] OR;  Service: Open Heart Surgery;  Laterality: N/A;   BIOPSY  09/27/2016   Procedure: BIOPSY;  Surgeon: Shaaron Lamar HERO, MD;  [facility];  Service: Endoscopy;;  colon   BIOPSY  10/18/2020   Procedure: BIOPSY;  Surgeon: Cindie Carlin POUR, DO;  [facility];  Service: Endoscopy;;   BREAST REDUCTION SURGERY Bilateral 01/21/2018   Procedure: BREAST REDUCTION WITH LIPOSUCTION;  Surgeon: Marcus Lung, MD;  Location: [facility];  Service: Plastics;  Laterality: Bilateral;   CARDIAC VALVE SURGERY  1968   CARPAL TUNNEL RELEASE Right 03/02/2020   Procedure: CARPAL TUNNEL RELEASE;  Surgeon: Margrette Taft BRAVO, MD;  Location: [facility] ORS;  Service: Orthopedics;  Laterality: Right;   CATARACT EXTRACTION W/PHACO Left 05/30/2019   Procedure:  CATARACT EXTRACTION PHACO AND INTRAOCULAR LENS PLACEMENT (IOC) (CDE: 4.94  );  Surgeon: Harrie Agent, MD;  Location: [facility];  Service: Ophthalmology;  Laterality: Left;   CATARACT EXTRACTION W/PHACO Right 06/16/2019   Procedure: CATARACT EXTRACTION PHACO AND INTRAOCULAR LENS PLACEMENT (IOC);  Surgeon: Harrie Agent, MD;  Location: [facility];  Service: Ophthalmology;  Laterality: Right;  CDE: 4.64   CERVICAL FUSION     CHOLECYSTECTOMY     COLONOSCOPY N/A 11/02/2023   Procedure: COLONOSCOPY;  Surgeon: Eartha Angelia Sieving, MD;  [facility];  Service: Gastroenterology;  Laterality: N/A;  11:30 am, asa 1/2   COLONOSCOPY WITH PROPOFOL  N/A 09/27/2016   Dr. Shaaron: Diverticulosis, several tubular adenomas removed ranging 4 to 7 mm in size, internal grade 1 hemorrhoids, terminal ileum normal, segmental biopsies negative for microscopic colitis.  Next colonoscopy June 2021   COLONOSCOPY WITH PROPOFOL  N/A 05/06/2020   internal hemorrhoids, sigmoid diverticulosis. Surveillance colonoscopy due in 2027    COLONOSCOPY WITH PROPOFOL  N/A 10/06/2022   Procedure: COLONOSCOPY WITH PROPOFOL ;  Surgeon: Eartha Angelia Sieving, MD;  [facility];  Service: Gastroenterology;  Laterality: N/A;  10:45AM;ASA 1   ESOPHAGOGASTRODUODENOSCOPY (EGD) WITH PROPOFOL  N/A 09/27/2016   Dr. Shaaron: Small hiatal hernia, mild Schatzki ring status post disruption, LA grade a esophagitis   ESOPHAGOGASTRODUODENOSCOPY (EGD) WITH PROPOFOL  N/A 10/18/2020   non-obstructing mild Schatzki ring, gastritis s/ biopsy. Negative H.pylori.   HEMOSTASIS CLIP PLACEMENT  10/06/2022   Procedure: HEMOSTASIS CLIP PLACEMENT;  Surgeon: Eartha Angelia Sieving, MD;  [facility];  Service: Gastroenterology;;   ILIAC ARTERY STENT Left 12/2007   IR ANGIOGRAM EXTREMITY BILATERAL  07/12/2021   IR ANGIOGRAM VISCERAL SELECTIVE  10/29/2020   IR ANGIOGRAM VISCERAL SELECTIVE  07/12/2021   IR ANGIOGRAM VISCERAL SELECTIVE  03/21/2022   IR AORTAGRAM ABDOMINAL SERIALOGRAM  03/21/2022   IR IVUS EACH ADDITIONAL NON CORONARY VESSEL  07/12/2021   IR IVUS EACH ADDITIONAL NON CORONARY VESSEL  03/21/2022   IR PTA NON CORO-LOWER EXTREM  03/21/2022   IR RADIOLOGIST EVAL & MGMT  10/26/2020   IR RADIOLOGIST EVAL & MGMT  11/10/2020   IR RADIOLOGIST EVAL & MGMT  02/09/2021   IR RADIOLOGIST EVAL & MGMT  06/16/2021   IR RADIOLOGIST EVAL & MGMT  08/15/2021   IR RADIOLOGIST EVAL & MGMT  10/18/2021   IR RADIOLOGIST EVAL & MGMT  01/11/2022   IR RADIOLOGIST EVAL & MGMT  03/02/2022   IR RADIOLOGIST EVAL & MGMT  05/11/2022   IR RADIOLOGIST EVAL & MGMT  08/18/2022   IR RADIOLOGIST EVAL & MGMT  10/18/2022   IR RADIOLOGIST EVAL & MGMT  05/21/2023   IR RADIOLOGIST EVAL & MGMT  08/29/2023   IR THROMBECT PRIM MECH INIT (INCLU) MOD SED  03/21/2022   IR THROMBECT SEC MECH MOD SED  07/12/2021   IR TRANSCATH PLC STENT 1ST ART NOT LE CV CAR VERT CAR  10/29/2020   IR TRANSCATH PLC STENT 1ST ART NOT LE CV CAR VERT CAR  07/12/2021   IR US  GUIDE VASC ACCESS RIGHT  10/29/2020    IR US  GUIDE VASC ACCESS RIGHT  07/12/2021   IR US  GUIDE VASC ACCESS RIGHT  03/21/2022   KNEE ARTHROSCOPY WITH MEDIAL MENISECTOMY Right 01/10/2018   Procedure: RIGHT KNEE ARTHROSCOPY WITH PARTIAL MEDIAL MENISECTOMY;  Surgeon: Margrette Taft BRAVO, MD;  Location: [facility];  Service: Orthopedics;  Laterality: Right;   LAPAROSCOPIC APPENDECTOMY N/A 08/03/2022   Procedure: APPENDECTOMY LAPAROSCOPIC;  Surgeon: Rubin Calamity, MD;  Location: [facility] OR;  Service: General;  Laterality: N/A;   LEFT AND RIGHT HEART CATHETERIZATION WITH CORONARY ANGIOGRAM N/A 07/31/2014   Procedure: LEFT AND RIGHT HEART CATHETERIZATION WITH CORONARY ANGIOGRAM;  Surgeon: Lonni JONETTA Cash, MD;  Location: [facility] CATH LAB;  Service: Cardiovascular;  Laterality: N/A;   MALONEY DILATION N/A 09/27/2016   Procedure: AGAPITO DILATION;  Surgeon: Shaaron Lamar HERO, MD;  [facility];  Service: Endoscopy;  Laterality: N/A;   POLYPECTOMY  09/27/2016   Procedure: POLYPECTOMY;  Surgeon: Shaaron Lamar HERO, MD;  [facility];  Service: Endoscopy;;  colon   POLYPECTOMY  10/06/2022   Procedure: POLYPECTOMY;  Surgeon: Eartha Angelia Sieving, MD;  [facility];  Service: Gastroenterology;;   ROTATOR CUFF REPAIR Bilateral    SUBMUCOSAL LIFTING INJECTION  10/06/2022   Procedure: SUBMUCOSAL LIFTING INJECTION;  Surgeon: Eartha Angelia Sieving, MD;  [facility];  Service: Gastroenterology;;   TEE WITHOUT CARDIOVERSION N/A 07/07/2014   Procedure: TRANSESOPHAGEAL ECHOCARDIOGRAM (TEE);  Surgeon: Dorn JULIANNA Ross, MD;  [facility];  Service: Cardiology;  Laterality: N/A;   TEE WITHOUT CARDIOVERSION N/A 07/20/2014   Procedure: TRANSESOPHAGEAL ECHOCARDIOGRAM (TEE) WITH PROPOFOL ;  Surgeon: Dorn JULIANNA Ross, MD;  [facility]: [facility];  Service: Endoscopy;  Laterality: N/A;   TEE WITHOUT CARDIOVERSION N/A 10/07/2014   Procedure: TRANSESOPHAGEAL ECHOCARDIOGRAM (TEE);  Surgeon: Sudie VEAR Laine, MD;   Location[facility] OR;  Service: Open Heart Surgery;  Laterality: N/A;   Family History  Problem Relation Age of Onset   Hypertension Mother    Hypertension Father    Heart disease Father        before age 79   Other Father        varicose veins   COPD Father    Colon cancer Neg Hx    Celiac disease Neg Hx    Inflammatory bowel disease Neg Hx    Social History   Socioeconomic History   Marital status: Married    Spouse name: Raschie   Number of children: 0   Years of education: some coll   Highest education level: Some college, no degree  Occupational History   Occupation: Financial planner: [employer]    Comment: [employer]  Tobacco Use   Smoking status: Former    Current packs/day: 0.00    Average packs/day: 0.5 packs/day for 40.0 years (20.0 ttl pk-yrs)    Types: Cigarettes    Start date: 05/1981    Quit date: 05/2021    Years since quitting: 2.7   Smokeless tobacco: Never  Vaping Use   Vaping status: Never Used  Substance and Sexual Activity   Alcohol use: Yes    Alcohol/week: 2.0 standard drinks of alcohol    Types: 2 Cans of beer per week    Comment: weekly   Drug use: No   Sexual activity: Yes    Birth control/protection: None  Other Topics Concern   Not on file  Social History Narrative   Patient is married   for 5 years . Patient works at [employer].. 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   Social Drivers of Corporate investment banker Strain: Low Risk  (02/06/2024)   Overall Financial Resource Strain (CARDIA)    Difficulty of Paying Living Expenses: Not very hard  Food Insecurity: No Food Insecurity (02/06/2024)   Hunger Vital Sign    Worried About Running Out of Food in the Last Year: Never true  Ran Out of Food in the Last Year: Never true  Transportation Needs: No Transportation Needs (02/06/2024)   PRAPARE - Administrator, Civil Service (Medical): No    Lack of Transportation (Non-Medical):  No  Physical Activity: Insufficiently Active (02/06/2024)   Exercise Vital Sign    Days of Exercise per Week: 1 day    Minutes of Exercise per Session: 20 min  Stress: No Stress Concern Present (02/06/2024)   [employer] of Occupational Health - Occupational Stress Questionnaire    Feeling of Stress: Only a little  Social Connections: Moderately Isolated (02/06/2024)   Social Connection and Isolation Panel    Frequency of Communication with Friends and Family: Once a week    Frequency of Social Gatherings with Friends and Family: Twice a week    Attends Religious Services: Patient declined    Database administrator or Organizations: No    Attends Engineer, structural: Not on file    Marital Status: Married    Tobacco Counseling Counseling given: Not Answered    Clinical Intake:  Pre-visit preparation completed: Yes  Pain : No/denies pain  Diabetes: No  Lab Results  Component Value Date   HGBA1C 5.5 09/24/2019   HGBA1C 5.5 10/05/2014     How often do you need to have someone help you when you read instructions, pamphlets, or other written materials from your doctor or pharmacy?: 1 - Never  Interpreter Needed?: No  Information entered by :: Charmaine Bloodgood LPN   Activities of Daily Living     02/06/2024    8:18 AM  In your present state of health, do you have any difficulty performing the following activities:  Hearing? 0  Vision? 0  Difficulty concentrating or making decisions? 0  Walking or climbing stairs? 0  Dressing or bathing? 0  Doing errands, shopping? 0  Preparing Food and eating ? N  Using the Toilet? N  In the past six months, have you accidently leaked urine? N  Do you have problems with loss of bowel control? N  Managing your Medications? N  Managing your Finances? N  Housekeeping or managing your Housekeeping? N    Patient Care Team: Aletha Bene, MD as PCP - General (Family Medicine) Alvan Dorn FALCON, MD as PCP -  Cardiology (Cardiology) Hope Almarie ORN, NP as Nurse Practitioner (Pulmonary Disease) Darroll Anes, DO (Optometry) Dannielle Baltazar HERO, NP as Nurse Practitioner (Nurse Practitioner)  I have updated your Care Teams any recent Medical Services you may have received from other providers in the past year.     Assessment:   This is a routine wellness examination for [place].  Hearing/Vision screen Hearing Screening - Comments:: Patient is able to hear conversational tones without difficulty. No issues reported.   Vision Screening - Comments:: Wears rx glasses - up to date with routine eye exams with Dr. Darroll    Goals Addressed             This Visit's Progress    Maintain health and independence   On track      Depression Screen     02/06/2024    9:22 AM 12/21/2023   12:03 PM 12/21/2023   12:02 PM 04/19/2023    3:04 PM 12/18/2022    3:41 PM 07/17/2022    3:52 PM 04/05/2022   10:42 AM  PHQ 2/9 Scores  PHQ - 2 Score 0 0 0 0 0 0   PHQ- 9 Score 0 0  0  0 0   Exception Documentation       Other- indicate reason in comment box  Not completed       Pt was here last week    Fall Risk     02/06/2024    8:17 AM 12/21/2023   12:02 PM 04/19/2023    3:04 PM 01/04/2023    9:17 AM 12/18/2022    3:41 PM  Fall Risk   Falls in the past year? 0 0 0 0 0  Number falls in past yr: 0  0 0 0  Injury with Fall? 0  0 0 0  Risk for fall due to : No Fall Risks  No Fall Risks  No Fall Risks  Follow up Falls prevention discussed;Education provided;Falls evaluation completed  Falls evaluation completed  Falls evaluation completed    MEDICARE RISK AT HOME:  Medicare Risk at Home Any stairs in or around the home?: No If so, are there any without handrails?: No Home free of loose throw rugs in walkways, pet beds, electrical cords, etc?: Yes Adequate lighting in your home to reduce risk of falls?: Yes Life alert?: No Use of a cane, walker or w/c?: No Grab bars in the bathroom?: Yes Shower chair or  bench in shower?: No Elevated toilet seat or a handicapped toilet?: Yes  TIMED UP AND GO:  Was the test performed?  No  Cognitive Function: 6CIT completed        02/06/2024    8:18 AM  6CIT Screen  What Year? 0 points  What month? 0 points  What time? 0 points  Count back from 20 0 points  Months in reverse 0 points  Repeat phrase 0 points  Total Score 0 points    Immunizations Immunization History  Administered Date(s) Administered   INFLUENZA, HIGH DOSE SEASONAL PF 02/26/2023, 01/08/2024   Influenza,inj,Quad PF,6+ Mos 01/02/2019, 02/17/2020, 01/16/2022   Influenza-Unspecified 02/20/2021   Moderna SARS-COV2 Booster Vaccination 09/02/2020   Moderna Sars-Covid-2 Vaccination 07/12/2019, 08/12/2019, 02/20/2020, 01/07/2021   Pfizer(Comirnaty)Fall Seasonal Vaccine 12 years and older 01/08/2024   Pneumococcal Conjugate-13 05/25/2017   Pneumococcal Polysaccharide-23 01/09/2019, 01/09/2019   Respiratory Syncytial Virus Vaccine ,Recomb Aduvanted(Arexvy ) 01/16/2022   Tdap 12/21/2023   Zoster Recombinant(Shingrix) 09/09/2020, 12/17/2020   Zoster, Unspecified 11/03/2020    Screening Tests Health Maintenance  Topic Date Due   Pneumococcal Vaccine: 50+ Years (3 of 3 - PCV20 or PCV21) 01/09/2024   Lung Cancer Screening  05/13/2024   Medicare Annual Wellness (AWV)  02/05/2025   Mammogram  01/06/2026   Colonoscopy  11/02/2026   Influenza Vaccine  Completed   DEXA SCAN  Completed   Meningococcal B Vaccine  Aged Out   DTaP/Tdap/Td  Discontinued   COVID-19 Vaccine  Discontinued   Hepatitis C Screening  Discontinued   Zoster Vaccines- Shingrix  Discontinued    Health Maintenance Items Addressed: Vaccines Due: Pneumonia   Additional Screening:  Vision Screening: Recommended annual ophthalmology exams for early detection of glaucoma and other disorders of the eye. Is the patient up to date with their annual eye exam?  Yes  Who is the provider or what is the name of the office  in which the patient attends annual eye exams? [facility]  Dental Screening: Recommended annual dental exams for proper oral hygiene  Community Resource Referral / Chronic Care Management: CRR required this visit?  No   CCM required this visit?  No   Plan:    I have personally reviewed and noted the following in  the patient's chart:   Medical and social history Use of alcohol, tobacco or illicit drugs  Current medications and supplements including opioid prescriptions. Patient is not currently taking opioid prescriptions. Functional ability and status Nutritional status Physical activity Advanced directives List of other physicians Hospitalizations, surgeries, and ER visits in previous 12 months Vitals Screenings to include cognitive, depression, and falls Referrals and appointments  In addition, I have reviewed and discussed with patient certain preventive protocols, quality metrics, and best practice recommendations. A written personalized care plan for preventive services as well as general preventive health recommendations were provided to patient.   Lavelle Pfeiffer [place], [place]   [id number]   After Visit Summary: (MyChart) Due to this being a telephonic visit, the after visit summary with patients personalized plan was offered to patient via MyChart   Notes: Nothing significant to report at this time.

## 2024-02-06 NOTE — Patient Instructions (Signed)
 [name],  Thank you for taking the time for your Medicare Wellness Visit. I appreciate your continued commitment to your health goals. Please review the care plan we discussed, and feel free to reach out if I can assist you further.  Medicare recommends these wellness visits once per year to help you and your care team stay ahead of potential health issues. These visits are designed to focus on prevention, allowing your provider to concentrate on managing your acute and chronic conditions during your regular appointments.  Please note that Annual Wellness Visits do not include a physical exam. Some assessments may be limited, especially if the visit was conducted virtually. If needed, we may recommend a separate in-person follow-up with your provider.  Ongoing Care Seeing your primary care provider every 3 to 6 months helps us  monitor your health and provide consistent, personalized care.   Referrals If a referral was made during today's visit and you haven't received any updates within two weeks, please contact the referred provider directly to check on the status.  Recommended Screenings:  Health Maintenance  Topic Date Due   Pneumococcal Vaccine for age over 45 (3 of 3 - PCV20 or PCV21) 01/09/2024   Screening for Lung Cancer  05/13/2024   Medicare Annual Wellness Visit  02/05/2025   Breast Cancer Screening  01/06/2026   Colon Cancer Screening  11/02/2026   Flu Shot  Completed   DEXA scan (bone density measurement)  Completed   Meningitis B Vaccine  Aged Out   DTaP/Tdap/Td vaccine  Discontinued   COVID-19 Vaccine  Discontinued   Hepatitis C Screening  Discontinued   Zoster (Shingles) Vaccine  Discontinued       02/06/2024    8:18 AM  Advanced Directives  Does Patient Have a Medical Advance Directive? No  Would patient like information on creating a medical advance directive? Yes (MAU/Ambulatory/Procedural Areas - Information given)   Advance Care Planning is important  because it: Ensures you receive medical care that aligns with your values, goals, and preferences. Provides guidance to your family and loved ones, reducing the emotional burden of decision-making during critical moments.  Information on Advanced Care Planning can be found at [place]  Secretary of [facility] Advance Health Care Directives Advance Health Care Directives ([email or web address]   Vision: Annual vision screenings are recommended for early detection of glaucoma, cataracts, and diabetic retinopathy. These exams can also reveal signs of chronic conditions such as diabetes and high blood pressure.  Dental: Annual dental screenings help detect early signs of oral cancer, gum disease, and other conditions linked to overall health, including heart disease and diabetes.  Please see the attached documents for additional preventive care recommendations.

## 2024-02-07 ENCOUNTER — Ambulatory Visit: Admitting: Orthopedic Surgery

## 2024-02-07 ENCOUNTER — Encounter: Payer: Self-pay | Admitting: Orthopedic Surgery

## 2024-02-07 VITALS — BP 168/109 | HR 64 | Ht 66.0 in | Wt 227.0 lb

## 2024-02-07 DIAGNOSIS — S86811A Strain of other muscle(s) and tendon(s) at lower leg level, right leg, initial encounter: Secondary | ICD-10-CM

## 2024-02-07 NOTE — Progress Notes (Signed)
  Intake history:  Chief Complaint  Patient presents with   Leg Pain    Right calf /leg cramp 730 am on Sept 19th, took Flexeril no better      Ht 5' 6 (1.676 m)   Wt 227 lb (103 kg)   BMI 36.64 kg/m  Body mass index is 36.64 kg/m.  Pharmacy? _____[facility]________________________________  WHAT ARE WE SEEING YOU FOR TODAY?   Right calf   How long has this bothered you? (DOI?DOS?WS?)  Sept 19th woke up with pain cramping   Was there an injury? No  Anticoag.  Yes  Diabetes No  Heart disease Yes  Hypertension Yes  SMOKING HX No  Kidney disease No     Latest Ref Rng & Units 01/11/2024    7:41 PM 12/21/2023   12:27 PM 04/19/2023    4:23 PM  CMP  Glucose 70 - 99 mg/dL 93  899  88   BUN 8 - 23 mg/dL 14  11  14    Creatinine 0.44 - 1.00 mg/dL 8.73  9.16  9.06   Sodium 135 - 145 mmol/L 137  137  139   Potassium 3.5 - 5.1 mmol/L 3.9  4.9  4.3   Chloride 98 - 111 mmol/L 104  105  105   CO2 22 - 32 mmol/L 22  25  25    Calcium  8.9 - 10.3 mg/dL 9.4  9.9  9.2   Total Protein 6.1 - 8.1 g/dL  6.9  7.2   Total Bilirubin 0.2 - 1.2 mg/dL  0.4  0.3   Alkaline Phos 39 - 117 U/L   57   AST 10 - 35 U/L  21  19   ALT 6 - 29 U/L  19  15      Any ALLERGIES ______________________________________________   Treatment:  Have you taken:  Tylenol  Yes  Advil  No  Had PT No  Had injection No  Other  _________________________

## 2024-02-07 NOTE — Progress Notes (Signed)
 Patient: Michelle Patton           Date of Birth: 29-Jul-1955           MRN: [id number] Visit Date: 02/07/2024 Requested by: Aletha Bene, MD [address] [place],  [place] [address] PCP: Aletha Bene, MD   Chief Complaint  Patient presents with   Leg Pain    Right calf /leg cramp 730 am on Sept 19th, took Flexeril no better    Encounter Diagnosis  Name Primary?   Strain of calf muscle, right, initial encounter Yes    Plan:  Compression activity modification  4 to 6-week resolution  Return as needed  Chief Complaint  Patient presents with   Leg Pain    Right calf /leg cramp 730 am on Sept 19th, took Flexeril no better     Leg Pain    68 year old female says she woke up with a right leg cramp she went to the ER after urgent care visit.  She was ruled out for DVT she comes in complaining of severe cramp in the leg on 19 September  Still having pain on the medial side of the calf  She did wear some compression stockings for a little bit of time  Body mass index is 36.64 kg/m.   Problem list, medical hx, medications and allergies reviewed   ROS no weakness no history of trauma does not remember doing anything the day before day of the injury/cramping   Allergies  Allergen Reactions   Iodinated Contrast Media Anaphylaxis, Shortness Of Breath, Itching, Swelling and Other (See Comments)    Patient was given Omni 350 and suffered from itching, chest pain and shortness of breath. Patient was taken to ED after onset of reaction. 10/11/2021  MD zackowski noted pt had tongue and throat swelling, had to give pt epinephrine     Sulfa Antibiotics Nausea And Vomiting    BP (!) 168/109   Pulse 64   Ht 5' 6 (1.676 m)   Wt 227 lb (103 kg)   BMI 36.64 kg/m    Physical exam: Physical Exam Vitals and nursing note reviewed.  Constitutional:      Appearance: Normal appearance.  HENT:     Head: Normocephalic and atraumatic.  Eyes:     General: No scleral  icterus.       Right eye: No discharge.        Left eye: No discharge.     Extraocular Movements: Extraocular movements intact.     Conjunctiva/sclera: Conjunctivae normal.     Pupils: Pupils are equal, round, and reactive to light.  Cardiovascular:     Rate and Rhythm: Normal rate.     Pulses: Normal pulses.  Skin:    General: Skin is warm and dry.     Capillary Refill: Capillary refill takes less than 2 seconds.  Neurological:     General: No focal deficit present.     Mental Status: She is alert and oriented to person, place, and time.     Motor: No weakness.     Gait: Gait normal.  Psychiatric:        Mood and Affect: Mood normal.        Behavior: Behavior normal.        Thought Content: Thought content normal.        Judgment: Judgment normal.    Right Ankle Exam    Comments:  Tenderness on the medial side of the right calf with normal plantarflexion dorsiflexion  good strength no bruising Achilles tendon intact ankle stable     Data reviewed:   Image(s) reviewed with personal interpretation:  Ultrasound report negative for DVT  Assessment and plan:  Encounter Diagnosis  Name Primary?   Strain of calf muscle, right, initial encounter Yes    Compression sleeve and activity modification   No orders of the defined types were placed in this encounter.   Procedures:   No procedures today

## 2024-02-19 ENCOUNTER — Encounter (INDEPENDENT_AMBULATORY_CARE_PROVIDER_SITE_OTHER): Payer: Self-pay | Admitting: Gastroenterology

## 2024-02-25 ENCOUNTER — Ambulatory Visit: Attending: Cardiology | Admitting: Cardiology

## 2024-02-25 ENCOUNTER — Encounter: Payer: Self-pay | Admitting: Cardiology

## 2024-02-25 VITALS — BP 158/78 | HR 63 | Ht 65.0 in | Wt 230.0 lb

## 2024-02-25 DIAGNOSIS — E782 Mixed hyperlipidemia: Secondary | ICD-10-CM

## 2024-02-25 DIAGNOSIS — Z952 Presence of prosthetic heart valve: Secondary | ICD-10-CM | POA: Diagnosis not present

## 2024-02-25 DIAGNOSIS — I10 Essential (primary) hypertension: Secondary | ICD-10-CM

## 2024-02-25 DIAGNOSIS — Z79899 Other long term (current) drug therapy: Secondary | ICD-10-CM | POA: Diagnosis not present

## 2024-02-25 MED ORDER — LOSARTAN POTASSIUM 50 MG PO TABS: 50.0000 mg | ORAL_TABLET | Freq: Every day | ORAL | 3 refills | Status: DC

## 2024-02-25 NOTE — Progress Notes (Addendum)
 Clinical Summary Michelle Patton is a 68 y.o.female seen today for follow up of the following medicla problems    1. Aortic stenosis -  hx in 1968 at age of 76 of supravavlular aortic stenosis, corrective surgery with patching at that time.   - found to have developed severe aortic valvular stenosis with possible recurrence of supravalvular stenosis as well based on imaging. 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   - Medtronic Freestyle stentless porcine valve/aortic root graft (size 21 mm, model # 995, serial # [id number]) -09/2017 echo LVEF 65-70%, no WMAS, grade I diastolic dysfunction, normal AVR.  -12/2021 echo: LVEF 65-70%, 21 mm Medtronic Freestyle stentless porcine valve. Peak and mean gradients through the valve are 13 and 5 mm Hg respectively.. The aortic valve has been repaired/replaced. Aortic valve regurgitation is not visualized.    Jan 2025 MRA: No evidence of aortic aneurysm or dissection.No pseudoaneurysm visible after prior aortic repair.       2. PAD - followed by vascular, history of left external iliac stent.   -no recent symptoms.      3. HTN - she is compliant iwht meds  - cough on ACE-I, discontinued - reported some insomina initialyl with losartan  but unclear if truly related     -she is compliant with meds   4. 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  - she is on ASA/plavix  per IR. She reports was told need lifelong plavix  due to multiple SMA stents over time.         6. OSA - moderate OSA by  08/2020. At this time was 201 lbs.  - cpap machine was expensive.            SH: [name] is also a patient of mine. 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      Trip to [place] to visit her mother. Born and raised in [place],moved to [place]. Father passed in February.  Enjoys riding motorcycles   Official retired, not liking so far.  Past Medical History:  Diagnosis Date   Allergy    Aortic regurgitation 07/20/2014   Aortic stenosis, severe 07/20/2014   Arthritis    CHF (congestive heart failure) (HCC)    DVT (deep venous thrombosis) (HCC)    Family history of adverse reaction to anesthesia    Reports father deliurm in his 58's with CABG   GERD (gastroesophageal reflux disease)    History of pneumonia    Hx of repair of rotator cuff    R and L rotator cuff repair   Hyperlipidemia    Hypertension    Insomnia, unspecified    patient stated d/t menopause   Mesenteric ischemia    Muscle weakness (generalized)    Peripheral artery disease    S/P redo aortic root replacement with stentless porcine aortic  root graft 10/07/2014   Redo sternotomy for 21 mm Medtronic Freestyle porcine aortic root graft w/ reimplantation of left main and right coronary arteries   Sleep apnea    diagnosed multiple years ago at [facility] aortic stenosis, congenital - s/p repair during childhood      Allergies  Allergen Reactions   Iodinated Contrast Media Anaphylaxis, Shortness Of Breath, Itching, Swelling and Other (See Comments)    Patient was given Omni 350 and suffered from itching, chest pain and shortness of breath. Patient was taken to ED after onset of reaction. 10/11/2021  MD zackowski noted pt had tongue and throat swelling, had to give pt epinephrine     Sulfa Antibiotics Nausea And Vomiting     Current Outpatient Medications  Medication Sig Dispense Refill    progesterone  (PROMETRIUM ) 200 MG capsule Take 2 capsules (400 mg total) by mouth daily. 180 capsule 1   acetaminophen  (TYLENOL ) 325 MG tablet Take 650 mg by mouth every 6 (six) hours as needed for mild pain, moderate pain, fever or headache.     aspirin  EC 81 MG tablet Take 1 tablet (81 mg total) by mouth daily. Swallow whole. 90 tablet 3   calcium  carbonate (OS-CAL - DOSED IN MG OF ELEMENTAL CALCIUM ) 1250 (500 Ca) MG tablet Take 1 tablet by mouth 2 (two) times daily with a meal.     Cholecalciferol (VITAMIN D -3 PO) Take 1 capsule by mouth in the morning and at bedtime.     clopidogrel  (PLAVIX ) 75 MG tablet Take 1 tablet (75 mg total) by mouth daily. 90 tablet 3   dicyclomine  (BENTYL ) 10 MG capsule Take 1 capsule (10 mg total) by mouth every 12 (twelve) hours as needed (abdominal pain). 180 capsule 0   estradiol  (ESTRACE ) 2 MG tablet Take 1 tablet (2 mg total) by mouth daily. (Patient not taking: Reported on 02/25/2024) 30 tablet 3   ezetimibe  (ZETIA ) 10 MG tablet TAKE 1 TABLET BY MOUTH DAILY 90 tablet 2   Flaxseed, Linseed, (FLAX SEED OIL PO) Take 1 capsule by mouth in the morning.     fluticasone  (FLONASE ) 50 MCG/ACT nasal spray Place 1 spray into both nostrils 2 (two) times daily. 16 g 2   metoprolol  tartrate (LOPRESSOR ) 25 MG tablet TAKE 1 TABLET BY MOUTH TWICE  DAILY 180 tablet 1   Multiple Minerals (CALCIUM -MAGNESIUM -ZINC) TABS Take 1 tablet by mouth in the morning.     NP THYROID  120 MG tablet Take 120 mg by mouth daily before breakfast. (Patient not taking: Reported on 02/25/2024)     RABEprazole  (ACIPHEX ) 20 MG tablet Take 1 tablet (20 mg total) by mouth 2 (two) times daily before a meal. 180 tablet 3   rosuvastatin  (CRESTOR ) 40 MG tablet TAKE 1 TABLET BY MOUTH DAILY 90 tablet 2   varenicline  (CHANTIX ) 1 MG tablet Take 1 tablet (1 mg total) by mouth 2 (two) times daily. 180 tablet 3   No current facility-administered medications for this visit.     Past Surgical History:  Procedure  Laterality Date   ABDOMINAL AORTAGRAM  06/24/2012   ABDOMINAL AORTAGRAM N/A 06/24/2012   Procedure: ABDOMINAL EZELLA;  Surgeon: Lonni GORMAN Blade, MD;  Location: [facility] CATH LAB;  Service: Cardiovascular;  Laterality: N/A;   AORTIC VALVE REPLACEMENT N/A 10/07/2014   Procedure: REDO AORTIC VALVE REPLACEMENT (AVR);  Surgeon: Sudie VEAR Laine, MD;  Location: [facility] OR;  Service: Open Heart Surgery;  Laterality: N/A;   ASCENDING AORTIC ROOT REPLACEMENT N/A 10/07/2014   Procedure:  ASCENDING AORTIC ROOT REPLACEMENT;  Surgeon: Sudie VEAR Laine, MD;  Location: [facility] OR;  Service: Open Heart Surgery;  Laterality: N/A;   BIOPSY  09/27/2016   Procedure: BIOPSY;  Surgeon: Shaaron Lamar HERO, MD;  [facility];  Service: Endoscopy;;  colon   BIOPSY  10/18/2020   Procedure: BIOPSY;  Surgeon: Cindie Carlin POUR, DO;  [facility];  Service: Endoscopy;;   BREAST REDUCTION SURGERY Bilateral 01/21/2018   Procedure: BREAST REDUCTION WITH LIPOSUCTION;  Surgeon: Marcus Lung, MD;  Location: [facility];  Service: Plastics;  Laterality: Bilateral;   CARDIAC VALVE SURGERY  1968   CARPAL TUNNEL RELEASE Right 03/02/2020   Procedure: CARPAL TUNNEL RELEASE;  Surgeon: Margrette Taft BRAVO, MD;  Location: [facility];  Service: Orthopedics;  Laterality: Right;   CATARACT EXTRACTION W/PHACO Left 05/30/2019   Procedure: CATARACT EXTRACTION PHACO AND INTRAOCULAR LENS PLACEMENT (IOC) (CDE: 4.94  );  Surgeon: Harrie Agent, MD;  Location: [facility];  Service: Ophthalmology;  Laterality: Left;   CATARACT EXTRACTION W/PHACO Right 06/16/2019   Procedure: CATARACT EXTRACTION PHACO AND INTRAOCULAR LENS PLACEMENT (IOC);  Surgeon: Harrie Agent, MD;  Location: [facility];  Service: Ophthalmology;  Laterality: Right;  CDE: 4.64   CERVICAL FUSION     CHOLECYSTECTOMY     COLONOSCOPY N/A 11/02/2023   Procedure: COLONOSCOPY;  Surgeon: Eartha Angelia Sieving, MD;  [facility];  Service: Gastroenterology;   Laterality: N/A;  11:30 am, asa 1/2   COLONOSCOPY WITH PROPOFOL  N/A 09/27/2016   Dr. Shaaron: Diverticulosis, several tubular adenomas removed ranging 4 to 7 mm in size, internal grade 1 hemorrhoids, terminal ileum normal, segmental biopsies negative for microscopic colitis.  Next colonoscopy June 2021   COLONOSCOPY WITH PROPOFOL  N/A 05/06/2020   internal hemorrhoids, sigmoid diverticulosis. Surveillance colonoscopy due in 2027   COLONOSCOPY WITH PROPOFOL  N/A 10/06/2022   Procedure: COLONOSCOPY WITH PROPOFOL ;  Surgeon: Eartha Angelia Sieving, MD;  [facility];  Service: Gastroenterology;  Laterality: N/A;  10:45AM;ASA 1   ESOPHAGOGASTRODUODENOSCOPY (EGD) WITH PROPOFOL  N/A 09/27/2016   Dr. Shaaron: Small hiatal hernia, mild Schatzki ring status post disruption, LA grade a esophagitis   ESOPHAGOGASTRODUODENOSCOPY (EGD) WITH PROPOFOL  N/A 10/18/2020   non-obstructing mild Schatzki ring, gastritis s/ biopsy. Negative H.pylori.   HEMOSTASIS CLIP PLACEMENT  10/06/2022   Procedure: HEMOSTASIS CLIP PLACEMENT;  Surgeon: Eartha Angelia Sieving, MD;  [facility];  Service: Gastroenterology;;   ILIAC ARTERY STENT Left 12/2007   IR ANGIOGRAM EXTREMITY BILATERAL  07/12/2021   IR ANGIOGRAM VISCERAL SELECTIVE  10/29/2020   IR ANGIOGRAM VISCERAL SELECTIVE  07/12/2021   IR ANGIOGRAM VISCERAL SELECTIVE  03/21/2022   IR AORTAGRAM ABDOMINAL SERIALOGRAM  03/21/2022   IR IVUS EACH ADDITIONAL NON CORONARY VESSEL  07/12/2021   IR IVUS EACH ADDITIONAL NON CORONARY VESSEL  03/21/2022   IR PTA NON CORO-LOWER EXTREM  03/21/2022   IR RADIOLOGIST EVAL & MGMT  10/26/2020   IR RADIOLOGIST EVAL & MGMT  11/10/2020   IR RADIOLOGIST EVAL & MGMT  02/09/2021   IR RADIOLOGIST EVAL & MGMT  06/16/2021   IR RADIOLOGIST EVAL & MGMT  08/15/2021   IR RADIOLOGIST EVAL & MGMT  10/18/2021   IR RADIOLOGIST EVAL & MGMT  01/11/2022   IR RADIOLOGIST EVAL & MGMT  03/02/2022   IR RADIOLOGIST EVAL & MGMT  05/11/2022   IR  RADIOLOGIST EVAL & MGMT  08/18/2022   IR RADIOLOGIST EVAL & MGMT  10/18/2022   IR RADIOLOGIST EVAL &  MGMT  05/21/2023   IR RADIOLOGIST EVAL & MGMT  08/29/2023   IR THROMBECT PRIM MECH INIT (INCLU) MOD SED  03/21/2022   IR THROMBECT SEC MECH MOD SED  07/12/2021   IR TRANSCATH PLC STENT 1ST ART NOT LE CV CAR VERT CAR  10/29/2020   IR TRANSCATH PLC STENT 1ST ART NOT LE CV CAR VERT CAR  07/12/2021   IR US  GUIDE VASC ACCESS RIGHT  10/29/2020   IR US  GUIDE VASC ACCESS RIGHT  07/12/2021   IR US  GUIDE VASC ACCESS RIGHT  03/21/2022   KNEE ARTHROSCOPY WITH MEDIAL MENISECTOMY Right 01/10/2018   Procedure: RIGHT KNEE ARTHROSCOPY WITH PARTIAL MEDIAL MENISECTOMY;  Surgeon: Margrette Taft BRAVO, MD;  Location: [facility];  Service: Orthopedics;  Laterality: Right;   LAPAROSCOPIC APPENDECTOMY N/A 08/03/2022   Procedure: APPENDECTOMY LAPAROSCOPIC;  Surgeon: Rubin Calamity, MD;  [facility] OR;  Service: General;  Laterality: N/A;   LEFT AND RIGHT HEART CATHETERIZATION WITH CORONARY ANGIOGRAM N/A 07/31/2014   Procedure: LEFT AND RIGHT HEART CATHETERIZATION WITH CORONARY ANGIOGRAM;  Surgeon: Lonni JONETTA Cash, MD;  Location: [facility] CATH LAB;  Service: Cardiovascular;  Laterality: N/A;   MALONEY DILATION N/A 09/27/2016   Procedure: AGAPITO DILATION;  Surgeon: Shaaron Lamar HERO, MD;  [facility];  Service: Endoscopy;  Laterality: N/A;   POLYPECTOMY  09/27/2016   Procedure: POLYPECTOMY;  Surgeon: Shaaron Lamar HERO, MD;  [facility];  Service: Endoscopy;;  colon   POLYPECTOMY  10/06/2022   Procedure: POLYPECTOMY;  Surgeon: Eartha Angelia Sieving, MD;  [facility];  Service: Gastroenterology;;   ROTATOR CUFF REPAIR Bilateral    SUBMUCOSAL LIFTING INJECTION  10/06/2022   Procedure: SUBMUCOSAL LIFTING INJECTION;  Surgeon: Eartha Angelia Sieving, MD;  [facility];  Service: Gastroenterology;;   TEE WITHOUT CARDIOVERSION N/A 07/07/2014   Procedure: TRANSESOPHAGEAL ECHOCARDIOGRAM  (TEE);  Surgeon: Dorn JULIANNA Ross, MD;  [facility];  Service: Cardiology;  Laterality: N/A;   TEE WITHOUT CARDIOVERSION N/A 07/20/2014   Procedure: TRANSESOPHAGEAL ECHOCARDIOGRAM (TEE) WITH PROPOFOL ;  Surgeon: Dorn JULIANNA Ross, MD;  Location: [facility];  Service: Endoscopy;  Laterality: N/A;   TEE WITHOUT CARDIOVERSION N/A 10/07/2014   Procedure: TRANSESOPHAGEAL ECHOCARDIOGRAM (TEE);  Surgeon: Sudie VEAR Laine, MD;  Location: [facility] OR;  Service: Open Heart Surgery;  Laterality: N/A;     Allergies  Allergen Reactions   Iodinated Contrast Media Anaphylaxis, Shortness Of Breath, Itching, Swelling and Other (See Comments)    Patient was given Omni 350 and suffered from itching, chest pain and shortness of breath. Patient was taken to ED after onset of reaction. 10/11/2021  MD zackowski noted pt had tongue and throat swelling, had to give pt epinephrine     Sulfa Antibiotics Nausea And Vomiting      Family History  Problem Relation Age of Onset   Hypertension Mother    Hypertension Father    Heart disease Father        before age 74   Other Father        varicose veins   COPD Father    Colon cancer Neg Hx    Celiac disease Neg Hx    Inflammatory bowel disease Neg Hx      Social History Ms. Lambson reports that she quit smoking about 2 years ago. Her smoking use included cigarettes. She started smoking about 42 years ago. She has a 20 pack-year smoking history. She has never used smokeless tobacco. Ms. Bain reports current alcohol use  of about 2.0 standard drinks of alcohol per week.    Physical Examination Today's Vitals   02/25/24 1257 02/25/24 1334  BP: (!) 177/87 (!) 158/78  Pulse: 63   SpO2: 100%   Weight: 230 lb (104.3 kg)   Height: 5' 5 (1.651 m)    Body mass index is 38.27 kg/m.  Gen: resting comfortably, no acute distress HEENT: no scleral icterus, pupils equal round and reactive, no palptable cervical adenopathy,  CV: RRR, 2/6 systolic murmur rusb, no  jvd Resp: Clear to auscultation bilaterally GI: abdomen is soft, non-tender, non-distended, normal bowel sounds, no hepatosplenomegaly MSK: extremities are warm, no edema.  Skin: warm, no rash Neuro:  no focal deficits Psych: appropriate affect   Diagnostic Studies  11/2015 echo Study Conclusions   - Left ventricle: The cavity size was normal. Wall thickness was   normal. Systolic function was vigorous. The estimated ejection   fraction was in the range of 65% to 70%. Wall motion was normal;   there were no regional wall motion abnormalities. Doppler   parameters are consistent with abnormal left ventricular   relaxation (grade 1 diastolic dysfunction). - Aortic valve: 21 mm Medtronic Freestyle stentless porcine   valve/aortic root graft in place. There was no significant   regurgitation. Peak gradient (S): 19 mm Hg. Valve area (Vmax):   1.83 cm^2. - Mitral valve: Calcified annulus. There was trivial regurgitation. - Right ventricle: The cavity size was mildly dilated. - Right atrium: Central venous pressure (est): 3 mm Hg. - Atrial septum: The septum bowed from right to left, consistent   with increased right atrial pressure. - Tricuspid valve: There was mild regurgitation. - Pulmonic valve: Peak gradient (S): 19 mm Hg. - Pulmonary arteries: PA peak pressure: 21 mm Hg (S). - Pericardium, extracardiac: There was no pericardial effusion.   Impressions:   - Normal LV wall thickness with LVEF 65-70%. Grade 1 diastolic   dysfunction. MAC with trivial mitral regurgitation. Bioprosthesis   in aortic position as described above with no significant   perivalvular regurgitation and peak gradient 19 mmHg. Mild RV   enlargement. Mild tricuspid regurgitation with PASP estimated 21   mmHg.    07/2014 cath Hemodynamic Findings: Ao:   107/55             LV: 190/12/15 RA:  1         RV:  41/2/6 PA:   26/6 (mean 15)     PCWP:  7 Fick Cardiac Output: 4.76 L/min Fick Cardiac Index:  2.38 L/min/m2 Central Aortic Saturation: 96% Pulmonary Artery Saturation: 66%   Aortic valve data:  Peak to peak gradient 83 mm Hg Mean gradient 56 mmHg AVA 0.52 cm2   Angiographic Findings:   Left main: Normal caliber vessel with no obstructive disease.    Left Anterior Descending Artery: Large caliber vessel that courses to the apex. Moderate caliber diagonal Secilia Apps. No obstructive disease.    Circumflex Artery: Large caliber vessel with moderate caliber obtuse marginal Oneida Mckamey. No obstructive disease.    Right Coronary Artery: Large dominant vessel with no obstructive disease.    Left Ventricular Angiogram: LVEF=60%   Aortic root angiogram: The aortic root is not enlarged. There is supravalvular density.    Aortic valve calcification noted on plain fluoroscopy.    Impression: 1. No angiographic evidence of CAD 2. Normal LV systolic function 3. Severe gradient across the aortic valve into the aorta with density noted in the aortic root. Cannot exclude recurrent supravalvular stenosis along  with aortic valve stenosis. 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  4. Normal filling pressures     09/2017 echo Study Conclusions   - Left ventricle: The cavity size was normal. Wall thickness was   normal. Systolic function was vigorous. The estimated ejection   fraction was in the range of 65% to 70%. Wall motion was normal;   there were no regional wall motion abnormalities. Doppler   parameters are consistent with abnormal left ventricular   relaxation (grade 1 diastolic dysfunction). Doppler parameters   are consistent with indeterminate ventricular filling pressure. - Aortic valve: 21 mm Medtronic Freestyle stentless porcine   valve/aortic root graft in place. There was no significant   regurgitation. There was no stenosis. Peak gradient (S): 11 mm   Hg. - Mitral valve: Mildly calcified annulus. Normal thickness leaflets   . -  Tricuspid valve: There was mild regurgitation. - Pulmonary arteries: PA peak pressure: 35 mm Hg (S).   12/2021 carotid US  IMPRESSION: Color duplex indicates minimal heterogeneous plaque, with no hemodynamically significant stenosis by duplex criteria in the extracranial cerebrovascular circulation.         12/2021 echo 1. Left ventricular ejection fraction, by estimation, is 65 to 70%. The  left ventricle has normal function. The left ventricle has no regional  wall motion abnormalities. Left ventricular diastolic parameters are  indeterminate.   2. Right ventricular systolic function is normal. The right ventricular  size is normal. There is mildly elevated pulmonary artery systolic  pressure.   3. Right atrial size was mildly dilated.   4. The mitral valve is normal in structure. Mild mitral valve  regurgitation.   5. S/p AVR. (21 mm Medtronic Freestyle stentless porcine valve. Peak and  mean gradients through the valve are 13 and 5 mm Hg respectively.. The  aortic valve has been repaired/replaced. Aortic valve regurgitation is not  visualized.   6. The inferior vena cava is normal in size with greater than 50%  respiratory variability, suggesting right atrial pressure of 3 mmHg.        Assessment and Plan  1.History of AVR/aortic repair - no symptoms, continue to monitor.    2. HLD - goal LDL <55 based on prior PAD interverntions - lipids at goal, continue current therapy    3. HTN - bp elevated, increase losartan  to 50mg  daily.   EKG today shows NSR    Dorn PHEBE Ross, M.D.

## 2024-02-25 NOTE — Patient Instructions (Signed)
 Medication Instructions:  Your physician has recommended you make the following change in your medication:   -Increase Losartan  to 50 mg once daily   *If you need a refill on your cardiac medications before your next appointment, please call your pharmacy*  Lab Work: In 2 weeks- BMET  If you have labs (blood work) drawn today and your tests are completely normal, you will receive your results only by: MyChart Message (if you have MyChart) OR A paper copy in the mail If you have any lab test that is abnormal or we need to change your treatment, we will call you to review the results.  Testing/Procedures: None  Follow-Up: At [facility], you and your health needs are our priority.  As part of our continuing mission to provide you with exceptional heart care, our providers are all part of one team.  This team includes your primary Cardiologist (physician) and Advanced Practice Providers or APPs (Physician Assistants and Nurse Practitioners) who all work together to provide you with the care you need, when you need it.  Your next appointment:   6 month(s)  Provider:   You may see Alvan Carrier, MD or one of the following Advanced Practice Providers on your designated Care Team:   Laymon Qua, PA-C  [place], [place] Olivia Pavy, [place]     We recommend signing up for the patient portal called MyChart.  Sign up information is provided on this After Visit Summary.  MyChart is used to connect with patients for Virtual Visits (Telemedicine).  Patients are able to view lab/test results, encounter notes, upcoming appointments, etc.  Non-urgent messages can be sent to your provider as well.   To learn more about what you can do with MyChart, go to [email or web address].   Other Instructions Nurse Visit- 2 weeks- BP check

## 2024-02-27 ENCOUNTER — Encounter (INDEPENDENT_AMBULATORY_CARE_PROVIDER_SITE_OTHER): Payer: Self-pay | Admitting: Gastroenterology

## 2024-03-11 ENCOUNTER — Ambulatory Visit: Attending: Cardiology

## 2024-03-11 ENCOUNTER — Other Ambulatory Visit (HOSPITAL_COMMUNITY)
Admission: RE | Admit: 2024-03-11 | Discharge: 2024-03-11 | Disposition: A | Discharge status: Home / Self Care | Source: Ambulatory Visit | Attending: Cardiology | Admitting: Cardiology

## 2024-03-11 VITALS — BP 158/96 | HR 68

## 2024-03-11 DIAGNOSIS — I10 Essential (primary) hypertension: Secondary | ICD-10-CM

## 2024-03-11 DIAGNOSIS — Z79899 Other long term (current) drug therapy: Secondary | ICD-10-CM | POA: Insufficient documentation

## 2024-03-11 LAB — BASIC METABOLIC PANEL WITH GFR
Anion gap: 12 (ref 5–15)
BUN: 14 mg/dL (ref 8–23)
CO2: 24 mmol/L (ref 22–32)
Calcium: 9.7 mg/dL (ref 8.9–10.3)
Chloride: 102 mmol/L (ref 98–111)
Creatinine, Ser: 0.95 mg/dL (ref 0.44–1.00)
GFR, Estimated: >60 mL/min (ref >60)
Glucose, Bld: 149 mg/dL — ABNORMAL HIGH (ref 70–99)
Potassium: 3.9 mmol/L (ref 3.5–5.1)
Sodium: 138 mmol/L (ref 135–145)

## 2024-03-11 NOTE — Progress Notes (Signed)
 Labs look fine from today. BP running high, would increase losartan  further from 50mg  daily to 100mg  daily. Repeat bp check 2 weeks   JBranch MD

## 2024-03-11 NOTE — Progress Notes (Signed)
 Patient presents today for nurse visit- blood pressure check.  Pt stated that she does not check her blood pressure at home.  At last OV, Losartan  was increased to 50 mg once daily. Patient compliant with medications.   Pt stated she has felt no different/okay since increasing medications.   Pt has no other complaints or questions today.   Pts bp in office today was: 158/96, hr- 68

## 2024-03-12 ENCOUNTER — Other Ambulatory Visit: Payer: Self-pay

## 2024-03-12 MED ORDER — LOSARTAN POTASSIUM 100 MG PO TABS: 100.0000 mg | ORAL_TABLET | Freq: Every day | ORAL | 3 refills | Status: AC

## 2024-03-12 NOTE — Progress Notes (Signed)
 Patient notified and verbalized understanding.

## 2024-03-25 ENCOUNTER — Ambulatory Visit

## 2024-03-26 ENCOUNTER — Ambulatory Visit: Admitting: Primary Care

## 2024-03-26 ENCOUNTER — Encounter: Payer: Self-pay | Admitting: Primary Care

## 2024-03-26 VITALS — BP 130/72 | HR 78 | Temp 97.3°F | Ht 65.0 in | Wt 229.8 lb

## 2024-03-26 DIAGNOSIS — G4733 Obstructive sleep apnea (adult) (pediatric): Secondary | ICD-10-CM

## 2024-03-26 DIAGNOSIS — G4730 Sleep apnea, unspecified: Secondary | ICD-10-CM | POA: Diagnosis not present

## 2024-03-26 NOTE — Patient Instructions (Addendum)
  VISIT SUMMARY: You came in today for a follow-up on your sleep study results. Your sleep apnea has improved from moderate to mild, and you are experiencing minimal symptoms. We discussed conservative management options and strategies to further improve your condition.  YOUR PLAN: -MILD OBSTRUCTIVE SLEEP APNEA: Mild obstructive sleep apnea is a condition where your breathing is briefly and repeatedly interrupted during sleep, but it is less severe than moderate or severe forms. Your recent sleep study showed improvement, and you are experiencing minimal symptoms. We recommend continuing with conservative management, focusing on weight loss efforts. Discuss weight management options with your primary care physician in the spring, including potential medications if diet and exercise are not enough. Avoid sleeping on your back and try to sleep on your side to reduce apneic events and snoring. Also, moderate your alcohol consumption, especially before bed, to prevent worsening of sleep apnea.  INSTRUCTIONS: Please follow up with your primary care physician in the spring to discuss weight management options. Continue with the recommended lifestyle changes and monitor your symptoms. If you experience any worsening of symptoms, please contact us .

## 2024-03-26 NOTE — Progress Notes (Signed)
 @Patient  ID: Michelle Patton, female    DOB: 1955-09-21, 68 y.o.   MRN: [id number]  Chief Complaint  Patient presents with   Obstructive Sleep Apnea    Discuss treatment options    Referring provider: Aletha Bene, MD  HPI: 68 year old female, current smoker. PMH significant for heart failure, HTN, aortic stenosis, PAD, OSA, COPD, GERD, hypothyroidism, obesity.   Previous [facility] pulmonary encounter:  10/17/2023 Patient presents today for sleep consult. Referred Dr. Alvan. Dx with sleep apnea several years ago. HST (WatchPat) in 2022 showed moderate OSA, AHI 19/hour. She was on CPAP previously but was unable to wear CPAP since she sleeps on her stomach. She had difficulty sleeping with PAP therapy, she did not try nasal mask.  Since she has retired she has no trouble sleeping. Denies significant fatigue  She is minimally symptomatic.  According to family she snores. She sleeps separately form her husband. She shares a bed with her dogs.  She has not woken up gasping or choking Bedtime is between 10-11pm Takes her 5 mins to fall asleep She wakes up on average once  She starts her day between 7-8 or 9-10; varies  She is retired She had sleep study several years ago Not currently wearing CPAP or oxygen She has gained 30-40 lbs in the last 2 years  No concern for narcolepsy or cataplexy   03/26/2024- Interim hx  Discussed the use of AI scribe software for clinical note transcription with the patient, who gave verbal consent to proceed.  History of Present Illness Stephan Draughn is a 68 year old female who presents for follow-up on her sleep study results. She was referred by Dr. Alvan for evaluation of her sleep apnea.  She was diagnosed with sleep apnea several years ago and underwent a home sleep study in 2022, which showed moderate sleep apnea with 19 apneic events per hour. She was previously on CPAP therapy but was unable to tolerate it due to sleeping on her stomach and  has since stopped using it. She did not try a nasal mask, which might be more suitable for stomach sleepers.  No significant trouble sleeping, minimal fatigue, and no episodes of waking up gasping or choking. Her family notes that she snores. Since retiring, she feels she sleeps better. A repeat home sleep test showed mild sleep apnea with 10 apneic events per hour and no significant oxygen desaturation.  She has gained approximately 60 to 80 pounds over the last few years. She recently changed primary care providers and is now seeing Dr. Therisa. Previously, she was losing weight under the care of Dr. Caswell, who has since passed away.  She consumes alcohol moderately, having two beers on Thursday nights at a local brewery. She is originally from [address] and has lived in various places including [place], [place] , and currently resides in [place]. She is considering moving to [place]  for better living conditions and lower crime rates when able.   Allergies  Allergen Reactions   Iodinated Contrast Media Anaphylaxis, Shortness Of Breath, Itching, Swelling and Other (See Comments)    Patient was given Omni 350 and suffered from itching, chest pain and shortness of breath. Patient was taken to ED after onset of reaction. 10/11/2021  MD zackowski noted pt had tongue and throat swelling, had to give pt epinephrine     Sulfa Antibiotics Nausea And Vomiting    Immunization History  Administered Date(s) Administered   INFLUENZA, HIGH DOSE SEASONAL PF 02/26/2023, 01/08/2024  Influenza,inj,Quad PF,6+ Mos 01/02/2019, 02/17/2020, 01/16/2022   Influenza-Unspecified 02/20/2021   Moderna SARS-COV2 Booster Vaccination 09/02/2020   Moderna Sars-Covid-2 Vaccination 07/12/2019, 08/12/2019, 02/20/2020, 01/07/2021   PNEUMOCOCCAL CONJUGATE-20 01/23/2024   Pfizer(Comirnaty)Fall Seasonal Vaccine 12 years and older 01/08/2024   Pneumococcal Conjugate-13 05/25/2017   Pneumococcal  Polysaccharide-23 01/09/2019, 01/09/2019   Respiratory Syncytial Virus Vaccine ,Recomb Aduvanted(Arexvy ) 01/16/2022   Tdap 12/21/2023   Zoster Recombinant(Shingrix) 09/09/2020, 12/17/2020   Zoster, Unspecified 11/03/2020    Past Medical History:  Diagnosis Date   Allergy    Aortic regurgitation 07/20/2014   Aortic stenosis, severe 07/20/2014   Arthritis    CHF (congestive heart failure) (HCC)    DVT (deep venous thrombosis) (HCC)    Family history of adverse reaction to anesthesia    Reports father deliurm in his 12's with CABG   GERD (gastroesophageal reflux disease)    History of pneumonia    Hx of repair of rotator cuff    R and L rotator cuff repair   Hyperlipidemia    Hypertension    Insomnia, unspecified    patient stated d/t menopause   Mesenteric ischemia    Muscle weakness (generalized)    Peripheral artery disease    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   Redo sternotomy for 21 mm Medtronic Freestyle porcine aortic root graft w/ reimplantation of left main and right coronary arteries   Sleep apnea    diagnosed multiple years ago at [facility] aortic stenosis, congenital - s/p repair during childhood     Tobacco History: Social History   Tobacco Use  Smoking Status Former   Current packs/day: 0.00   Average packs/day: 0.5 packs/day for 40.0 years (20.0 ttl pk-yrs)   Types: Cigarettes   Start date: 05/1981   Quit date: 05/2021   Years since quitting: 2.8  Smokeless Tobacco Never   Counseling given: Not Answered   Outpatient Medications Prior to Visit  Medication Sig Dispense Refill   acetaminophen  (TYLENOL ) 325 MG tablet Take 650 mg by mouth every 6 (six) hours as needed for mild pain, moderate pain, fever or headache.     aspirin  EC 81 MG tablet Take 1 tablet (81 mg total) by mouth daily. Swallow whole. 90 tablet 3   calcium  carbonate (OS-CAL - DOSED IN MG OF ELEMENTAL CALCIUM ) 1250 (500 Ca)  MG tablet Take 1 tablet by mouth 2 (two) times daily with a meal.     Cholecalciferol (VITAMIN D -3 PO) Take 1 capsule by mouth in the morning and at bedtime.     clopidogrel  (PLAVIX ) 75 MG tablet Take 1 tablet (75 mg total) by mouth daily. 90 tablet 3   dicyclomine  (BENTYL ) 10 MG capsule Take 1 capsule (10 mg total) by mouth every 12 (twelve) hours as needed (abdominal pain). 180 capsule 0   estradiol  (ESTRACE ) 2 MG tablet Take 1 tablet (2 mg total) by mouth daily. 30 tablet 3   ezetimibe  (ZETIA ) 10 MG tablet TAKE 1 TABLET BY MOUTH DAILY 90 tablet 2   Flaxseed, Linseed, (FLAX SEED OIL PO) Take 1 capsule by mouth in the morning.     fluticasone  (FLONASE ) 50 MCG/ACT nasal spray Place 1 spray into both nostrils 2 (two) times daily. 16 g 2   levothyroxine (SYNTHROID) 50 MCG tablet Take 50 mcg by mouth every morning.     liothyronine  (CYTOMEL ) 5 MCG tablet Take by mouth.     losartan  (COZAAR ) 100 MG tablet Take 1 tablet (100 mg  total) by mouth daily. 90 tablet 3   metoprolol  tartrate (LOPRESSOR ) 25 MG tablet TAKE 1 TABLET BY MOUTH TWICE  DAILY 180 tablet 1   Multiple Minerals (CALCIUM -MAGNESIUM -ZINC) TABS Take 1 tablet by mouth in the morning.     progesterone  (PROMETRIUM ) 200 MG capsule Take 2 capsules (400 mg total) by mouth daily. 180 capsule 1   RABEprazole  (ACIPHEX ) 20 MG tablet Take 1 tablet (20 mg total) by mouth 2 (two) times daily before a meal. 180 tablet 3   rosuvastatin  (CRESTOR ) 40 MG tablet TAKE 1 TABLET BY MOUTH DAILY 90 tablet 2   varenicline  (CHANTIX ) 1 MG tablet Take 1 tablet (1 mg total) by mouth 2 (two) times daily. 180 tablet 3   NP THYROID  120 MG tablet Take 120 mg by mouth daily before breakfast. (Patient not taking: Reported on 02/25/2024)     No facility-administered medications prior to visit.   Review of Systems  Review of Systems  Constitutional: Negative.  Negative for fatigue.  Eyes: Negative.   Respiratory: Negative.    Psychiatric/Behavioral:  Negative for sleep  disturbance.    Physical Exam  BP 130/72   Pulse 78   Temp (!) 97.3 F (36.3 C)   Ht 5' 5 (1.651 m) Comment: pt stated  Wt 229 lb 12.8 oz (104.2 kg)   SpO2 97% Comment: ra  BMI 38.24 kg/m  Physical Exam Constitutional:      Appearance: Normal appearance. She is well-developed.  HENT:     Head: Normocephalic and atraumatic.     Mouth/Throat:     Mouth: Mucous membranes are moist.     Pharynx: Oropharynx is clear.  Eyes:     Pupils: Pupils are equal, round, and reactive to light.  Cardiovascular:     Rate and Rhythm: Normal rate and regular rhythm.     Heart sounds: Normal heart sounds. No murmur heard. Pulmonary:     Effort: Pulmonary effort is normal. No respiratory distress.     Breath sounds: Normal breath sounds. No wheezing or rhonchi.  Musculoskeletal:        General: Normal range of motion.     Cervical back: Normal range of motion and neck supple.  Skin:    General: Skin is warm and dry.     Findings: No erythema or rash.  Neurological:     General: No focal deficit present.     Mental Status: She is alert and oriented to person, place, and time. Mental status is at baseline.  Psychiatric:        Mood and Affect: Mood normal.        Behavior: Behavior normal.        Thought Content: Thought content normal.        Judgment: Judgment normal.     Lab Results:  CBC    Component Value Date/Time   WBC 7.6 01/11/2024 1941   RBC 4.57 01/11/2024 1941   HGB 12.7 01/11/2024 1941   HGB 12.8 12/29/2021 1557   HCT 39.0 01/11/2024 1941   HCT 38.7 12/29/2021 1557   PLT 234 01/11/2024 1941   PLT 222 12/29/2021 1557   MCV 85.3 01/11/2024 1941   MCV 87 12/29/2021 1557   MCH 27.8 01/11/2024 1941   MCHC 32.6 01/11/2024 1941   RDW 13.3 01/11/2024 1941   RDW 13.1 12/29/2021 1557   LYMPHSABS 2.2 01/11/2024 1941   LYMPHSABS 2.8 12/29/2021 1557   MONOABS 0.9 01/11/2024 1941   EOSABS 0.3 01/11/2024 1941   EOSABS  0.3 12/29/2021 1557   BASOSABS 0.1 01/11/2024 1941    BASOSABS 0.1 12/29/2021 1557    BMET    Component Value Date/Time   NA 138 03/11/2024 1433   NA 140 07/18/2022 0929   K 3.9 03/11/2024 1433   CL 102 03/11/2024 1433   CO2 24 03/11/2024 1433   GLUCOSE 149 (H) 03/11/2024 1433   BUN 14 03/11/2024 1433   BUN 10 07/18/2022 0929   CREATININE 0.95 03/11/2024 1433   CREATININE 0.83 12/21/2023 1227   CALCIUM  9.7 03/11/2024 1433   GFRNONAA >60 03/11/2024 1433   GFRNONAA 82 09/22/2020 1637   GFRAA 95 09/22/2020 1637    BNP    Component Value Date/Time   BNP 39.0 10/11/2021 1703    ProBNP    Component Value Date/Time   PROBNP 155.0 (H) 04/19/2023 1623    Imaging: No results found.   Assessment & Plan:   1. Mild sleep apnea (Primary)  Assessment and Plan Assessment & Plan Mild obstructive sleep apnea Improved from moderate sleep apnea with 19 apneic events per hour to mild with 10 events per hour. No significant oxygen desaturation. Minimally symptomatic with no significant fatigue, gasping, or choking. Snoring present. Improved sleep quality since retirement. Conservative management is appropriate due to lack of significant symptoms and improved sleep study results. - Continue conservative management with weight loss efforts. - Discuss weight management options with primary care physician in spring, including potential use of medications like Mounjaro or Wegovy if traditional diet and exercise are insufficient. - Avoid sleeping flat on back; side sleeping position recommended to reduce apneic events and snoring. - Moderate alcohol consumption, especially before bed, to prevent worsening of sleep apnea.  Recording duration: 12 minutes   Almarie LELON Ferrari, NP 03/26/2024

## 2024-03-27 ENCOUNTER — Ambulatory Visit: Attending: Internal Medicine

## 2024-03-27 ENCOUNTER — Encounter: Payer: Self-pay | Admitting: Cardiology

## 2024-03-27 VITALS — BP 136/68 | HR 76

## 2024-03-27 DIAGNOSIS — Z0130 Encounter for examination of blood pressure without abnormal findings: Secondary | ICD-10-CM

## 2024-03-27 NOTE — Patient Instructions (Addendum)
 Omron blood pressure machine    I will share your reading with Dr.Branch and I will call you if he makes any changes

## 2024-03-31 NOTE — Progress Notes (Signed)
 BP looks ok, no further med changes at this time  JINNY Ross MD

## 2024-04-03 ENCOUNTER — Encounter (INDEPENDENT_AMBULATORY_CARE_PROVIDER_SITE_OTHER): Payer: Self-pay | Admitting: Gastroenterology

## 2024-04-03 ENCOUNTER — Ambulatory Visit (INDEPENDENT_AMBULATORY_CARE_PROVIDER_SITE_OTHER): Admitting: Gastroenterology

## 2024-04-03 VITALS — BP 138/83 | HR 71 | Temp 98.6°F | Ht 65.0 in | Wt 229.9 lb

## 2024-04-03 DIAGNOSIS — R109 Unspecified abdominal pain: Secondary | ICD-10-CM

## 2024-04-03 DIAGNOSIS — K551 Chronic vascular disorders of intestine: Secondary | ICD-10-CM

## 2024-04-03 NOTE — Patient Instructions (Signed)
 Continue Levsin as needed for abdominal pain Continue with smoking avoidance The patient should reach Dr. Jennefer if presenting recurrent abdominal pain

## 2024-04-03 NOTE — Progress Notes (Signed)
 Michelle Patton, M.D. Gastroenterology & Hepatology [facility] Gastroenterology [address] [place], [place] [address]  Primary Care Physician: Aletha Bene, MD [address] [place] [address]  I will communicate my assessment and recommendations to the referring MD via EMR.  Problems: Chronic mesenteric ischemia status post stent placement on 10/29/2020 complicated by recurrent thrombosis status post thrombus aspiration, SMA stent recanalization and balloon angioplasty Recurrent abdominal pain.   History of Present Illness: Michelle Patton is a 68 y.o. female with past medical history of CAD, history of chronic abdominal pain secondary to chronic mesenteric ischemia status post stent placement on 10/29/2020 complicated by recurrent thrombosis status post SMA stent recanalization and balloon angioplasty, severe aortic stenosis, DVT, GERD, hyperlipidemia, hypertension, peripheral arterial disease, sleep apnea, who presents for follow up of chronic mesenteric ischemia and abdominal pain.   The patient was last seen on 09/20/2023. At that time, the patient was scheduled for colonoscopy given history of piecemeal polypectomy, with finding described below.  Advised to take Levsin as needed for abdominal pain and smoking avoidance.  Patient reports that reports that he reports that she feels well. Denies having any more abdominal pain. Has not smoked in a long time. She is using Chantix  BID, which sometimes causes some nausea. The patient denies having any nausea, vomiting, fever, chills, hematochezia, melena, hematemesis, abdominal distention, abdominal pain, diarrhea, jaundice, pruritus or weight loss.  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  There was significant artifact at the stent site which affected visualization of the patency.  As she had history of anaphylactic  reaction to contrast, patient was advised to follow-up with interventional radiology closely.   Patient saw Dr. Jennefer on 08/29/2023.  It was discussed that the best way to evaluate the patency of the stent will be with endovascular examination with CO2 and IVUS .  However, the patient did not feel ready to pursue evaluation and wanted to hold off on this for now.  Last EGD:09/2020 Nonobstructing Schatzki's ring, gastritis, some falls had pallor.  Normal duodenum.  Biopsies negative for H. pylori.  Last Colonoscopy: 11/02/2023 - Five 2 to 6 mm polyps in the transverse colon, in the ascending colon and in the cecum, removed with a cold snare. Resected and retrieved. - Diverticulosis in the sigmoid colon and in the ascending colon. - Non- bleeding internal hemorrhoids.  Recommend a repeat colonoscopy in 3 years  Past Medical History: Past Medical History:  Diagnosis Date   Allergy    Aortic regurgitation 07/20/2014   Aortic stenosis, severe 07/20/2014   Arthritis    CHF (congestive heart failure) (HCC)    DVT (deep venous thrombosis) (HCC)    Family history of adverse reaction to anesthesia    Reports father deliurm in his 75's with CABG   GERD (gastroesophageal reflux disease)    History of pneumonia    Hx of repair of rotator cuff    R and L rotator cuff repair   Hyperlipidemia    Hypertension    Insomnia, unspecified    patient stated d/t menopause   Mesenteric ischemia    Muscle weakness (generalized)    Peripheral artery disease    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   Redo sternotomy for 21 mm Medtronic Freestyle porcine aortic root graft w/ reimplantation of left main and right coronary arteries  Sleep apnea    diagnosed multiple years ago at [facility] aortic stenosis, congenital - s/p repair during childhood     Past Surgical History: Past Surgical History:  Procedure Laterality Date   ABDOMINAL AORTAGRAM   06/24/2012   ABDOMINAL AORTAGRAM N/A 06/24/2012   Procedure: ABDOMINAL EZELLA;  Surgeon: Lonni GORMAN Blade, MD;  Location: [facility] CATH LAB;  Service: Cardiovascular;  Laterality: N/A;   AORTIC VALVE REPLACEMENT N/A 10/07/2014   Procedure: REDO AORTIC VALVE REPLACEMENT (AVR);  Surgeon: Sudie VEAR Laine, MD;  Location: [facility] OR;  Service: Open Heart Surgery;  Laterality: N/A;   ASCENDING AORTIC ROOT REPLACEMENT N/A 10/07/2014   Procedure: ASCENDING AORTIC ROOT REPLACEMENT;  Surgeon: Sudie VEAR Laine, MD;  Location: [facility] OR;  Service: Open Heart Surgery;  Laterality: N/A;   BIOPSY  09/27/2016   Procedure: BIOPSY;  Surgeon: Shaaron Lamar HERO, MD;  Location: [facility];  Service: Endoscopy;;  colon   BIOPSY  10/18/2020   Procedure: BIOPSY;  Surgeon: Cindie Carlin POUR, DO;  [facility]: [facility];  Service: Endoscopy;;   BREAST REDUCTION SURGERY Bilateral 01/21/2018   Procedure: BREAST REDUCTION WITH LIPOSUCTION;  Surgeon: Marcus Lung, MD;  Location: [facility];  Service: Plastics;  Laterality: Bilateral;   CARDIAC VALVE SURGERY  1968   CARPAL TUNNEL RELEASE Right 03/02/2020   Procedure: CARPAL TUNNEL RELEASE;  Surgeon: Margrette Taft BRAVO, MD;  Location: [facility] ORS;  Service: Orthopedics;  Laterality: Right;   CATARACT EXTRACTION W/PHACO Left 05/30/2019   Procedure: CATARACT EXTRACTION PHACO AND INTRAOCULAR LENS PLACEMENT (IOC) (CDE: 4.94  );  Surgeon: Harrie Agent, MD;  Location: [facility] ORS;  Service: Ophthalmology;  Laterality: Left;   CATARACT EXTRACTION W/PHACO Right 06/16/2019   Procedure: CATARACT EXTRACTION PHACO AND INTRAOCULAR LENS PLACEMENT (IOC);  Surgeon: Harrie Agent, MD;  Location: [facility] ORS;  Service: Ophthalmology;  Laterality: Right;  CDE: 4.64   CERVICAL FUSION     CHOLECYSTECTOMY     COLONOSCOPY N/A 11/02/2023   Procedure: COLONOSCOPY;  Surgeon: Eartha Angelia Sieving, MD;  [facility]: [facility];  Service: Gastroenterology;  Laterality: N/A;  11:30 am, asa 1/2    COLONOSCOPY WITH PROPOFOL  N/A 09/27/2016   Dr. Shaaron: Diverticulosis, several tubular adenomas removed ranging 4 to 7 mm in size, internal grade 1 hemorrhoids, terminal ileum normal, segmental biopsies negative for microscopic colitis.  Next colonoscopy June 2021   COLONOSCOPY WITH PROPOFOL  N/A 05/06/2020   internal hemorrhoids, sigmoid diverticulosis. Surveillance colonoscopy due in 2027   COLONOSCOPY WITH PROPOFOL  N/A 10/06/2022   Procedure: COLONOSCOPY WITH PROPOFOL ;  Surgeon: Eartha Angelia Sieving, MD;  [facility];  Service: Gastroenterology;  Laterality: N/A;  10:45AM;ASA 1   ESOPHAGOGASTRODUODENOSCOPY (EGD) WITH PROPOFOL  N/A 09/27/2016   Dr. Shaaron: Small hiatal hernia, mild Schatzki ring status post disruption, LA grade a esophagitis   ESOPHAGOGASTRODUODENOSCOPY (EGD) WITH PROPOFOL  N/A 10/18/2020   non-obstructing mild Schatzki ring, gastritis s/ biopsy. Negative H.pylori.   HEMOSTASIS CLIP PLACEMENT  10/06/2022   Procedure: HEMOSTASIS CLIP PLACEMENT;  Surgeon: Eartha Angelia Sieving, MD;  [facility];  Service: Gastroenterology;;   ILIAC ARTERY STENT Left 12/2007   IR ANGIOGRAM EXTREMITY BILATERAL  07/12/2021   IR ANGIOGRAM VISCERAL SELECTIVE  10/29/2020   IR ANGIOGRAM VISCERAL SELECTIVE  07/12/2021   IR ANGIOGRAM VISCERAL SELECTIVE  03/21/2022   IR AORTAGRAM ABDOMINAL SERIALOGRAM  03/21/2022   IR IVUS EACH ADDITIONAL NON CORONARY VESSEL  07/12/2021   IR IVUS EACH ADDITIONAL NON CORONARY VESSEL  03/21/2022  IR PTA NON CORO-LOWER EXTREM  03/21/2022   IR RADIOLOGIST EVAL & MGMT  10/26/2020   IR RADIOLOGIST EVAL & MGMT  11/10/2020   IR RADIOLOGIST EVAL & MGMT  02/09/2021   IR RADIOLOGIST EVAL & MGMT  06/16/2021   IR RADIOLOGIST EVAL & MGMT  08/15/2021   IR RADIOLOGIST EVAL & MGMT  10/18/2021   IR RADIOLOGIST EVAL & MGMT  01/11/2022   IR RADIOLOGIST EVAL & MGMT  03/02/2022   IR RADIOLOGIST EVAL & MGMT  05/11/2022   IR RADIOLOGIST EVAL & MGMT  08/18/2022   IR  RADIOLOGIST EVAL & MGMT  10/18/2022   IR RADIOLOGIST EVAL & MGMT  05/21/2023   IR RADIOLOGIST EVAL & MGMT  08/29/2023   IR THROMBECT PRIM MECH INIT (INCLU) MOD SED  03/21/2022   IR THROMBECT SEC MECH MOD SED  07/12/2021   IR TRANSCATH PLC STENT 1ST ART NOT LE CV CAR VERT CAR  10/29/2020   IR TRANSCATH PLC STENT 1ST ART NOT LE CV CAR VERT CAR  07/12/2021   IR US  GUIDE VASC ACCESS RIGHT  10/29/2020   IR US  GUIDE VASC ACCESS RIGHT  07/12/2021   IR US  GUIDE VASC ACCESS RIGHT  03/21/2022   KNEE ARTHROSCOPY WITH MEDIAL MENISECTOMY Right 01/10/2018   Procedure: RIGHT KNEE ARTHROSCOPY WITH PARTIAL MEDIAL MENISECTOMY;  Surgeon: Margrette Taft BRAVO, MD;  Location: [facility];  Service: Orthopedics;  Laterality: Right;   LAPAROSCOPIC APPENDECTOMY N/A 08/03/2022   Procedure: APPENDECTOMY LAPAROSCOPIC;  Surgeon: Rubin Calamity, MD;  Location: [facility] OR;  Service: General;  Laterality: N/A;   LEFT AND RIGHT HEART CATHETERIZATION WITH CORONARY ANGIOGRAM N/A 07/31/2014   Procedure: LEFT AND RIGHT HEART CATHETERIZATION WITH CORONARY ANGIOGRAM;  Surgeon: Lonni JONETTA Cash, MD;  Location: [facility] CATH LAB;  Service: Cardiovascular;  Laterality: N/A;   MALONEY DILATION N/A 09/27/2016   Procedure: AGAPITO DILATION;  Surgeon: Shaaron Lamar HERO, MD;  [facility];  Service: Endoscopy;  Laterality: N/A;   POLYPECTOMY  09/27/2016   Procedure: POLYPECTOMY;  Surgeon: Shaaron Lamar HERO, MD;  [facility];  Service: Endoscopy;;  colon   POLYPECTOMY  10/06/2022   Procedure: POLYPECTOMY;  Surgeon: Eartha Angelia Sieving, MD;  [facility];  Service: Gastroenterology;;   ROTATOR CUFF REPAIR Bilateral    SUBMUCOSAL LIFTING INJECTION  10/06/2022   Procedure: SUBMUCOSAL LIFTING INJECTION;  Surgeon: Eartha Angelia Sieving, MD;  [facility];  Service: Gastroenterology;;   TEE WITHOUT CARDIOVERSION N/A 07/07/2014   Procedure: TRANSESOPHAGEAL ECHOCARDIOGRAM (TEE);  Surgeon: Dorn JULIANNA Ross, MD;   [facility];  Service: Cardiology;  Laterality: N/A;   TEE WITHOUT CARDIOVERSION N/A 07/20/2014   Procedure: TRANSESOPHAGEAL ECHOCARDIOGRAM (TEE) WITH PROPOFOL ;  Surgeon: Dorn JULIANNA Ross, MD;  Location: [facility];  Service: Endoscopy;  Laterality: N/A;   TEE WITHOUT CARDIOVERSION N/A 10/07/2014   Procedure: TRANSESOPHAGEAL ECHOCARDIOGRAM (TEE);  Surgeon: Sudie VEAR Laine, MD;  Location: [facility] OR;  Service: Open Heart Surgery;  Laterality: N/A;    Family History: Family History  Problem Relation Age of Onset   Hypertension Mother    Hypertension Father    Heart disease Father        before age 29   Other Father        varicose veins   COPD Father    Colon cancer Neg Hx    Celiac disease Neg Hx    Inflammatory bowel disease Neg Hx     Social History:Tobacco Use History[1] Social History  Substance and Sexual Activity  Alcohol Use Yes   Alcohol/week: 2.0 standard drinks of alcohol   Types: 2 Cans of beer per week   Comment: weekly   Social History   Substance and Sexual Activity  Drug Use No    Allergies: Allergies[2]  Medications: Current Outpatient Medications  Medication Sig Dispense Refill   acetaminophen  (TYLENOL ) 325 MG tablet Take 650 mg by mouth every 6 (six) hours as needed for mild pain, moderate pain, fever or headache.     aspirin  EC 81 MG tablet Take 1 tablet (81 mg total) by mouth daily. Swallow whole. 90 tablet 3   calcium  carbonate (OS-CAL - DOSED IN MG OF ELEMENTAL CALCIUM ) 1250 (500 Ca) MG tablet Take 1 tablet by mouth 2 (two) times daily with a meal.     Cholecalciferol (VITAMIN D -3 PO) Take 1 capsule by mouth in the morning and at bedtime.     clopidogrel  (PLAVIX ) 75 MG tablet Take 1 tablet (75 mg total) by mouth daily. 90 tablet 3   dicyclomine  (BENTYL ) 10 MG capsule Take 1 capsule (10 mg total) by mouth every 12 (twelve) hours as needed (abdominal pain). 180 capsule 0   estradiol  (ESTRACE ) 2 MG tablet Take 1 tablet (2 mg total) by mouth  daily. 30 tablet 3   ezetimibe  (ZETIA ) 10 MG tablet TAKE 1 TABLET BY MOUTH DAILY 90 tablet 2   Flaxseed, Linseed, (FLAX SEED OIL PO) Take 1 capsule by mouth in the morning.     fluticasone  (FLONASE ) 50 MCG/ACT nasal spray Place 1 spray into both nostrils 2 (two) times daily. 16 g 2   levothyroxine (SYNTHROID) 50 MCG tablet Take 50 mcg by mouth every morning.     liothyronine  (CYTOMEL ) 5 MCG tablet Take by mouth. (Patient taking differently: Take 5 mcg by mouth daily.)     losartan  (COZAAR ) 100 MG tablet Take 1 tablet (100 mg total) by mouth daily. 90 tablet 3   metoprolol  tartrate (LOPRESSOR ) 25 MG tablet TAKE 1 TABLET BY MOUTH TWICE  DAILY 180 tablet 1   Multiple Minerals (CALCIUM -MAGNESIUM -ZINC) TABS Take 1 tablet by mouth in the morning.     progesterone  (PROMETRIUM ) 200 MG capsule Take 2 capsules (400 mg total) by mouth daily. 180 capsule 1   RABEprazole  (ACIPHEX ) 20 MG tablet Take 1 tablet (20 mg total) by mouth 2 (two) times daily before a meal. 180 tablet 3   rosuvastatin  (CRESTOR ) 40 MG tablet TAKE 1 TABLET BY MOUTH DAILY 90 tablet 2   varenicline  (CHANTIX ) 1 MG tablet Take 1 tablet (1 mg total) by mouth 2 (two) times daily. 180 tablet 3   No current facility-administered medications for this visit.    Review of Systems: GENERAL: negative for malaise, night sweats HEENT: No changes in hearing or vision, no nose bleeds or other nasal problems. NECK: Negative for lumps, goiter, pain and significant neck swelling RESPIRATORY: Negative for cough, wheezing CARDIOVASCULAR: Negative for chest pain, leg swelling, palpitations, orthopnea GI: SEE HPI MUSCULOSKELETAL: Negative for joint pain or swelling, back pain, and muscle pain. SKIN: Negative for lesions, rash PSYCH: Negative for sleep disturbance, mood disorder and recent psychosocial stressors. HEMATOLOGY Negative for prolonged bleeding, bruising easily, and swollen nodes. ENDOCRINE: Negative for cold or heat intolerance, polyuria,  polydipsia and goiter. NEURO: negative for tremor, gait imbalance, syncope and seizures. The remainder of the review of systems is noncontributory.   Physical Exam: BP 138/83 (BP Location: Left Arm, Patient Position: Sitting, Cuff Size: Large)   Pulse 71  Temp 98.6 F (37 C) (Temporal)   Ht 5' 5 (1.651 m)   Wt 229 lb 14.4 oz (104.3 kg)   BMI 38.26 kg/m  GENERAL: The patient is AO x3, in no acute distress. Obese. HEENT: Head is normocephalic and atraumatic. EOMI are intact. Mouth is well hydrated and without lesions. NECK: Supple. No masses LUNGS: Clear to auscultation. No presence of rhonchi/wheezing/rales. Adequate chest expansion HEART: RRR, normal s1 and s2. ABDOMEN: Soft, nontender, no guarding, no peritoneal signs, and nondistended. BS +. No masses. EXTREMITIES: Without any cyanosis, clubbing, rash, lesions or edema. NEUROLOGIC: AOx3, no focal motor deficit. SKIN: no jaundice, no rashes  Imaging/Labs: as above  I personally reviewed and interpreted the available labs, imaging and endoscopic files.  Impression and Plan: Michelle Patton is a 68 y.o. female with past medical history of CAD, history of chronic abdominal pain secondary to chronic mesenteric ischemia status post stent placement on 10/29/2020 complicated by recurrent thrombosis status post SMA stent recanalization and balloon angioplasty, severe aortic stenosis, DVT, GERD, hyperlipidemia, hypertension, peripheral arterial disease, sleep apnea, who presents for follow up of chronic mesenteric ischemia and abdominal pain.  Patient has known very well since she quit smoking and has not presented any more abdominal pain issues.  Has not required to take Levsin and denies any complaints at the moment.  She should continue smoking avoidance.  I congratulated her for this.  -Continue Levsin as needed for abdominal pain -Continue with smoking avoidance -The patient should reach Dr. Jennefer if presenting recurrent abdominal  pain   All questions were answered.      Michelle Fortune, MD Gastroenterology and Hepatology [facility] Gastroenterology     [1]  Social History Tobacco Use  Smoking Status Former   Current packs/day: 0.00   Average packs/day: 0.5 packs/day for 40.0 years (20.0 ttl pk-yrs)   Types: Cigarettes   Start date: 05/1981   Quit date: 05/2021   Years since quitting: 2.8  Smokeless Tobacco Never  [2]  Allergies Allergen Reactions   Iodinated Contrast Media Anaphylaxis, Shortness Of Breath, Itching, Swelling and Other (See Comments)    Patient was given Omni 350 and suffered from itching, chest pain and shortness of breath. Patient was taken to ED after onset of reaction. 10/11/2021  MD zackowski noted pt had tongue and throat swelling, had to give pt epinephrine     Sulfa Antibiotics Nausea And Vomiting

## 2024-04-07 ENCOUNTER — Encounter: Payer: Self-pay | Admitting: Family Medicine

## 2024-04-07 ENCOUNTER — Ambulatory Visit: Admitting: Family Medicine

## 2024-04-07 VITALS — BP 136/84 | HR 83 | Temp 98.7°F | Ht 65.0 in | Wt 228.4 lb

## 2024-04-07 DIAGNOSIS — E039 Hypothyroidism, unspecified: Secondary | ICD-10-CM

## 2024-04-07 DIAGNOSIS — Z87891 Personal history of nicotine dependence: Secondary | ICD-10-CM

## 2024-04-07 DIAGNOSIS — K551 Chronic vascular disorders of intestine: Secondary | ICD-10-CM

## 2024-04-07 DIAGNOSIS — E785 Hyperlipidemia, unspecified: Secondary | ICD-10-CM

## 2024-04-07 DIAGNOSIS — Z0000 Encounter for general adult medical examination without abnormal findings: Secondary | ICD-10-CM

## 2024-04-07 DIAGNOSIS — I10 Essential (primary) hypertension: Secondary | ICD-10-CM

## 2024-04-07 DIAGNOSIS — N289 Disorder of kidney and ureter, unspecified: Secondary | ICD-10-CM

## 2024-04-07 DIAGNOSIS — R7309 Other abnormal glucose: Secondary | ICD-10-CM

## 2024-04-07 NOTE — Progress Notes (Signed)
 Complete physical exam  Assessment & Plan:    Routine Health Maintenance and Physical Exam Discussed health benefits of physical activity, and encouraged her to engage in regular exercise appropriate for her age and condition.  Preventative health care  Elevated glucose -     Hemoglobin A1c  Acquired hypothyroidism -     TSH  Essential hypertension -     CBC with Differential/Platelet -     Comprehensive metabolic panel with GFR  Hyperlipidemia, unspecified hyperlipidemia type -     Lipid panel  Abnormal kidney function -     Comprehensive metabolic panel with GFR  Former smoker -     CT CHEST LUNG CANCER SCREENING LOW DOSE WO CONTRAST; Future  Chronic mesenteric ischemia   Return in about 3 months (around 07/06/2024), or if symptoms worsen or fail to improve. Recommend healthy diet i.e mediterranean/DASH diet, consistent exercise - 30 minutes 5 day per week, and gradual weight loss. Encouraged her to start going to [facility] close to her home since she has Silver Sneakers       Subjective:  Patient ID: Michelle Patton, female    DOB: 04/29/1955  Age: 68 y.o. MRN: [id number] Chief Complaint  Patient presents with   Annual Exam    Here for CPE. No new concerns.     Michelle Patton is a 68 y.o. female who presents today for a complete physical exam. Colonoscopy-UTD, polyps, July 2028 DEXA scan-normal Sandor Shingrix-UTD Former smoker- quit 2024, never had CT scan lung cancer screening.  Pap smear - after endometrial ablation- 20 years ago. Patient is UTD mammogram History of Present Illness Michelle Patton is a 68 year old female who presents for a routine physical exam.  She has not had a physical exam in many years, possibly since college. She recently completed a mammogram and bone density test. She notes a loss of an inch and a half in height. Her cholesterol levels were last checked by Dr. Alvan, but she is unsure of the results. She has a history of  colonoscopy with polyps and is on a three-year surveillance schedule, with the next colonoscopy due in July 2028. Patient has history of mesenteric ischemia  She quit smoking in 2024 and has a significant smoking history, which may qualify her for lung cancer screening. She is allergic to contrast dye, having experienced a severe reaction during a previous CT scan with contrast.  She underwent a sleep study on December 3rd, which showed mild sleep apnea. Her sleep apnea has improved and is now very mild. She is not currently using a CPAP machine due to being a stomach sleeper and has not tried a nasal mask. She was told she does not need to use anything for it.   She has concerns about her blood sugar levels, reporting a phase of excessive thirst about a month ago, with blood sugar readings of 140 to 200 mg/dL. The excessive thirst has since resolved, and she has stopped checking her sugar levels regularly.  She has a history of thyroid  issues and was previously on NP thyroid , which was stopped due to hyperthyroidism. She is now on levothyroxine 50 mg and another thyroid  medication, possibly Cytomel . She sees provider at [facility].   Her family history includes a sister who recently had a pulmonary embolism after a procedure on varicose veins, and her mother, who is 69 years old, has a complex psychiatric history. Her mother lives independently in [place], and her  sister frequently visits to assist her.  Socially, she has concerns about safety in her current living environment, which affects her ability to engage in physical activity. She has a history of being active, walking frequently when living in more urban areas. She is considering joining a local gym to improve her physical activity levels.  No falls, regular bowel movements without blood, and notes hip and back pain with certain movements.  Physical Exam ABDOMEN: Mild abdominal tenderness.  Results Labs 25(OH)  Vitamin D : Within normal limits Thyroid  function tests: Previously hyperthyroid, now within normal limits after medication adjustment  Radiology Screening mammography: Normal Bone densitometry: Normal  Diagnostic Polysomnography (home sleep study) (03/26/2024): Very mild obstructive sleep apnea, no significant oxygen desaturation  Assessment and Plan Adult Wellness Visit Routine wellness visit with normal recent mammogram and bone density scan. Colonoscopy up to date. Vaccinations current. - Ordered blood work. - Scheduled Pap smear in three months. - Encouraged regular exercise and safe walking environments.  Evaluation of abnormal glucose Reports of excessive thirst and borderline high blood glucose levels. Further evaluation warranted. Patient concerned about type 2 diabetes/prediabetes Ordered A1C blood glucose test.  Acquired hypothyroidism Thyroid  levels stabilized on levothyroxine and Cytomel . - Continue current thyroid  medication regimen.  Hyperlipidemia Cholesterol levels not recently checked.  Chronic mesenteric ischemia Managed with regular colonoscopy surveillance. - Continue regular colonoscopy surveillance every three years.  History of tobacco use Quit smoking in 2024. Eligible for lung cancer screening due to 20 pack-year history. - Ordered low-dose CT scan for lung cancer screening without contrast. Health Maintenance  Topic Date Due   Screening for Lung Cancer  05/13/2024   Medicare Annual Wellness Visit  02/05/2025   Breast Cancer Screening  01/06/2026   Colon Cancer Screening  11/02/2026   Pneumococcal Vaccine for age over 63  Completed   Flu Shot  Completed   Osteoporosis screening with Bone Density Scan  Completed   Meningitis B Vaccine  Aged Out   DTaP/Tdap/Td vaccine  Discontinued   COVID-19 Vaccine  Discontinued   Hepatitis C Screening  Discontinued   Zoster (Shingles) Vaccine  Discontinued    Most recent fall risk assessment:     02/06/2024    8:17 AM  Fall Risk   Falls in the past year? 0  Number falls in past yr: 0  Injury with Fall? 0   Risk for fall due to : No Fall Risks  Follow up Falls prevention discussed;Education provided;Falls evaluation completed     Data saved with a previous flowsheet row definition     Most recent depression screenings:    02/06/2024    9:22 AM 12/21/2023   12:03 PM  PHQ 2/9 Scores  PHQ - 2 Score 0 0  PHQ- 9 Score 0  0      Data saved with a previous flowsheet row definition      The ASCVD Risk score (Arnett DK, et al., 2019) failed to calculate for the following reasons:   Risk score cannot be calculated because patient has a medical history suggesting prior/existing ASCVD   * - Cholesterol units were assumed  Past Surgical History:  Procedure Laterality Date   ABDOMINAL AORTAGRAM  06/24/2012   ABDOMINAL AORTAGRAM N/A 06/24/2012   Procedure: ABDOMINAL EZELLA;  Surgeon: Lonni GORMAN Blade, MD;  Location: [facility] CATH LAB;  Service: Cardiovascular;  Laterality: N/A;   AORTIC VALVE REPLACEMENT N/A 10/07/2014   Procedure: REDO AORTIC VALVE REPLACEMENT (AVR);  Surgeon: Sudie VEAR Laine, MD;  Location: [facility]  OR;  Service: Open Heart Surgery;  Laterality: N/A;   ASCENDING AORTIC ROOT REPLACEMENT N/A 10/07/2014   Procedure: ASCENDING AORTIC ROOT REPLACEMENT;  Surgeon: Sudie VEAR Laine, MD;  Location: [facility] OR;  Service: Open Heart Surgery;  Laterality: N/A;   BIOPSY  09/27/2016   Procedure: BIOPSY;  Surgeon: Shaaron Lamar HERO, MD;  [facility];  Service: Endoscopy;;  colon   BIOPSY  10/18/2020   Procedure: BIOPSY;  Surgeon: Cindie Carlin POUR, DO;  [facility]: [facility];  Service: Endoscopy;;   BREAST REDUCTION SURGERY Bilateral 01/21/2018   Procedure: BREAST REDUCTION WITH LIPOSUCTION;  Surgeon: Marcus Lung, MD;  Location: [facility];  Service: Plastics;  Laterality: Bilateral;   CARDIAC VALVE SURGERY  1968   CARPAL TUNNEL RELEASE Right 03/02/2020    Procedure: CARPAL TUNNEL RELEASE;  Surgeon: Margrette Taft BRAVO, MD;  Location: [facility] ORS;  Service: Orthopedics;  Laterality: Right;   CATARACT EXTRACTION W/PHACO Left 05/30/2019   Procedure: CATARACT EXTRACTION PHACO AND INTRAOCULAR LENS PLACEMENT (IOC) (CDE: 4.94  );  Surgeon: Harrie Agent, MD;  Location: [facility];  Service: Ophthalmology;  Laterality: Left;   CATARACT EXTRACTION W/PHACO Right 06/16/2019   Procedure: CATARACT EXTRACTION PHACO AND INTRAOCULAR LENS PLACEMENT (IOC);  Surgeon: Harrie Agent, MD;  Location: [facility];  Service: Ophthalmology;  Laterality: Right;  CDE: 4.64   CERVICAL FUSION     CHOLECYSTECTOMY     COLONOSCOPY N/A 11/02/2023   Procedure: COLONOSCOPY;  Surgeon: Eartha Angelia Sieving, MD;  [facility];  Service: Gastroenterology;  Laterality: N/A;  11:30 am, asa 1/2   COLONOSCOPY WITH PROPOFOL  N/A 09/27/2016   Dr. Shaaron: Diverticulosis, several tubular adenomas removed ranging 4 to 7 mm in size, internal grade 1 hemorrhoids, terminal ileum normal, segmental biopsies negative for microscopic colitis.  Next colonoscopy June 2021   COLONOSCOPY WITH PROPOFOL  N/A 05/06/2020   internal hemorrhoids, sigmoid diverticulosis. Surveillance colonoscopy due in 2027   COLONOSCOPY WITH PROPOFOL  N/A 10/06/2022   Procedure: COLONOSCOPY WITH PROPOFOL ;  Surgeon: Eartha Angelia Sieving, MD;  [facility];  Service: Gastroenterology;  Laterality: N/A;  10:45AM;ASA 1   ESOPHAGOGASTRODUODENOSCOPY (EGD) WITH PROPOFOL  N/A 09/27/2016   Dr. Shaaron: Small hiatal hernia, mild Schatzki ring status post disruption, LA grade a esophagitis   ESOPHAGOGASTRODUODENOSCOPY (EGD) WITH PROPOFOL  N/A 10/18/2020   non-obstructing mild Schatzki ring, gastritis s/ biopsy. Negative H.pylori.   HEMOSTASIS CLIP PLACEMENT  10/06/2022   Procedure: HEMOSTASIS CLIP PLACEMENT;  Surgeon: Eartha Angelia Sieving, MD;  [facility];  Service: Gastroenterology;;   ILIAC ARTERY STENT  Left 12/2007   IR ANGIOGRAM EXTREMITY BILATERAL  07/12/2021   IR ANGIOGRAM VISCERAL SELECTIVE  10/29/2020   IR ANGIOGRAM VISCERAL SELECTIVE  07/12/2021   IR ANGIOGRAM VISCERAL SELECTIVE  03/21/2022   IR AORTAGRAM ABDOMINAL SERIALOGRAM  03/21/2022   IR IVUS EACH ADDITIONAL NON CORONARY VESSEL  07/12/2021   IR IVUS EACH ADDITIONAL NON CORONARY VESSEL  03/21/2022   IR PTA NON CORO-LOWER EXTREM  03/21/2022   IR RADIOLOGIST EVAL & MGMT  10/26/2020   IR RADIOLOGIST EVAL & MGMT  11/10/2020   IR RADIOLOGIST EVAL & MGMT  02/09/2021   IR RADIOLOGIST EVAL & MGMT  06/16/2021   IR RADIOLOGIST EVAL & MGMT  08/15/2021   IR RADIOLOGIST EVAL & MGMT  10/18/2021   IR RADIOLOGIST EVAL & MGMT  01/11/2022   IR RADIOLOGIST EVAL & MGMT  03/02/2022   IR RADIOLOGIST EVAL & MGMT  05/11/2022   IR RADIOLOGIST  EVAL & MGMT  08/18/2022   IR RADIOLOGIST EVAL & MGMT  10/18/2022   IR RADIOLOGIST EVAL & MGMT  05/21/2023   IR RADIOLOGIST EVAL & MGMT  08/29/2023   IR THROMBECT PRIM MECH INIT (INCLU) MOD SED  03/21/2022   IR THROMBECT SEC MECH MOD SED  07/12/2021   IR TRANSCATH PLC STENT 1ST ART NOT LE CV CAR VERT CAR  10/29/2020   IR TRANSCATH PLC STENT 1ST ART NOT LE CV CAR VERT CAR  07/12/2021   IR US  GUIDE VASC ACCESS RIGHT  10/29/2020   IR US  GUIDE VASC ACCESS RIGHT  07/12/2021   IR US  GUIDE VASC ACCESS RIGHT  03/21/2022   KNEE ARTHROSCOPY WITH MEDIAL MENISECTOMY Right 01/10/2018   Procedure: RIGHT KNEE ARTHROSCOPY WITH PARTIAL MEDIAL MENISECTOMY;  Surgeon: Margrette Taft BRAVO, MD;  Location: [facility];  Service: Orthopedics;  Laterality: Right;   LAPAROSCOPIC APPENDECTOMY N/A 08/03/2022   Procedure: APPENDECTOMY LAPAROSCOPIC;  Surgeon: Rubin Calamity, MD;  Location: [facility] OR;  Service: General;  Laterality: N/A;   LEFT AND RIGHT HEART CATHETERIZATION WITH CORONARY ANGIOGRAM N/A 07/31/2014   Procedure: LEFT AND RIGHT HEART CATHETERIZATION WITH CORONARY ANGIOGRAM;  Surgeon: Lonni JONETTA Cash, MD;  Location: [facility] CATH LAB;  Service:  Cardiovascular;  Laterality: N/A;   MALONEY DILATION N/A 09/27/2016   Procedure: AGAPITO DILATION;  Surgeon: Shaaron Lamar HERO, MD;  [facility];  Service: Endoscopy;  Laterality: N/A;   POLYPECTOMY  09/27/2016   Procedure: POLYPECTOMY;  Surgeon: Shaaron Lamar HERO, MD;  [facility];  Service: Endoscopy;;  colon   POLYPECTOMY  10/06/2022   Procedure: POLYPECTOMY;  Surgeon: Eartha Angelia Sieving, MD;  [facility];  Service: Gastroenterology;;   ROTATOR CUFF REPAIR Bilateral    SUBMUCOSAL LIFTING INJECTION  10/06/2022   Procedure: SUBMUCOSAL LIFTING INJECTION;  Surgeon: Eartha Angelia Sieving, MD;  [facility];  Service: Gastroenterology;;   TEE WITHOUT CARDIOVERSION N/A 07/07/2014   Procedure: TRANSESOPHAGEAL ECHOCARDIOGRAM (TEE);  Surgeon: Dorn JULIANNA Ross, MD;  [facility];  Service: Cardiology;  Laterality: N/A;   TEE WITHOUT CARDIOVERSION N/A 07/20/2014   Procedure: TRANSESOPHAGEAL ECHOCARDIOGRAM (TEE) WITH PROPOFOL ;  Surgeon: Dorn JULIANNA Ross, MD;  Location: [facility];  Service: Endoscopy;  Laterality: N/A;   TEE WITHOUT CARDIOVERSION N/A 10/07/2014   Procedure: TRANSESOPHAGEAL ECHOCARDIOGRAM (TEE);  Surgeon: Sudie VEAR Laine, MD;  Location: [facility] OR;  Service: Open Heart Surgery;  Laterality: N/A;   Social History[1] Social History   Socioeconomic History   Marital status: Married    Spouse name: Raschie   Number of children: 0   Years of education: some coll   Highest education level: Some college, no degree  Occupational History   Occupation: Financial Planner: [employer]    Comment: [employer]  Tobacco Use   Smoking status: Former    Current packs/day: 0.00    Average packs/day: 0.5 packs/day for 40.0 years (20.0 ttl pk-yrs)    Types: Cigarettes    Start date: 05/1981    Quit date: 05/2021    Years since quitting: 2.8   Smokeless tobacco: Never  Vaping Use   Vaping status: Never Used  Substance and Sexual Activity    Alcohol use: Yes    Alcohol/week: 2.0 standard drinks of alcohol    Types: 2 Cans of beer per week    Comment: weekly   Drug use: No   Sexual activity: Yes    Birth control/protection: None  Other Topics  Concern   Not on file  Social History Narrative   Patient is married   for 5 years . Patient works at [employer].. 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   Social Drivers of Health   Tobacco Use: Medium Risk (04/07/2024)   Patient History    Smoking Tobacco Use: Former    Smokeless Tobacco Use: Never    Passive Exposure: Not on file  Financial Resource Strain: Low Risk (04/06/2024)   Overall Financial Resource Strain (CARDIA)    Difficulty of Paying Living Expenses: Not hard at all  Food Insecurity: No Food Insecurity (04/06/2024)   Epic    Worried About Programme Researcher, Broadcasting/film/video in the Last Year: Never true    Ran Out of Food in the Last Year: Never true  Transportation Needs: No Transportation Needs (04/06/2024)   Epic    Lack of Transportation (Medical): No    Lack of Transportation (Non-Medical): No  Physical Activity: Inactive (04/06/2024)   Exercise Vital Sign    Days of Exercise per Week: 0 days    Minutes of Exercise per Session: Not on file  Stress: No Stress Concern Present (04/06/2024)   [employer] of Occupational Health - Occupational Stress Questionnaire    Feeling of Stress: Not at all  Social Connections: Moderately Isolated (04/06/2024)   Social Connection and Isolation Panel    Frequency of Communication with Friends and Family: More than three times a week    Frequency of Social Gatherings with Friends and Family: Twice a week    Attends Religious Services: Never    Database Administrator or Organizations: No    Attends Engineer, Structural: Not on file    Marital Status: Married  Intimate Partner Violence: Not At Risk (02/06/2024)   Epic    Fear of Current or Ex-Partner: No    Emotionally  Abused: No    Physically Abused: No    Sexually Abused: No  Depression (PHQ2-9): Low Risk (02/06/2024)   Depression (PHQ2-9)    PHQ-2 Score: 0  Alcohol Screen: Low Risk (04/06/2024)   Alcohol Screen    Last Alcohol Screening Score (AUDIT): 3  Housing: Low Risk (04/06/2024)   Epic    Unable to Pay for Housing in the Last Year: No    Number of Times Moved in the Last Year: 0    Homeless in the Last Year: No  Utilities: Not At Risk (02/06/2024)   Epic    Threatened with loss of utilities: No  Health Literacy: Adequate Health Literacy (02/06/2024)   B1300 Health Literacy    Frequency of need for help with medical instructions: Never   Family History  Problem Relation Age of Onset   Hypertension Mother    Hypertension Father    Heart disease Father        before age 27   Other Father        varicose veins   COPD Father    Pulmonary embolism Sister    Colon cancer Neg Hx    Celiac disease Neg Hx    Inflammatory bowel disease Neg Hx    Allergies[2]   Patient Care Team: Aletha Bene, MD as PCP - General (Family Medicine) Alvan, Dorn FALCON, MD as PCP - Cardiology (Cardiology) Hope Almarie ORN, NP as Nurse Practitioner (Pulmonary Disease) Darroll Anes, DO (Optometry) Dannielle Baltazar HERO, NP as Nurse Practitioner (Nurse Practitioner)   Show/hide medication list[3]  ROS     Objective:  BP 136/84   Pulse 83   Temp 98.7 F (37.1 C)   Ht 5' 5 (1.651 m)   Wt 228 lb 6 oz (103.6 kg)   SpO2 99%   BMI 38.00 kg/m  BP Readings from Last 3 Encounters:  04/07/24 136/84  04/03/24 138/83  03/27/24 136/68   Wt Readings from Last 3 Encounters:  04/07/24 228 lb 6 oz (103.6 kg)  04/03/24 229 lb 14.4 oz (104.3 kg)  03/26/24 229 lb 12.8 oz (104.2 kg)    Physical Exam Vitals and nursing note reviewed.  Constitutional:      General: She is not in acute distress.    Appearance: Normal appearance.  HENT:     Head: Normocephalic.     Right Ear: Tympanic membrane, ear  canal and external ear normal.     Left Ear: Tympanic membrane, ear canal and external ear normal.  Eyes:     Extraocular Movements: Extraocular movements intact.     Pupils: Pupils are equal, round, and reactive to light.  Cardiovascular:     Rate and Rhythm: Normal rate and regular rhythm.     Heart sounds: Normal heart sounds.  Pulmonary:     Effort: Pulmonary effort is normal.     Breath sounds: Normal breath sounds. No wheezing or rhonchi.  Chest:  Breasts:    Right: Normal. No bleeding, inverted nipple, mass, nipple discharge or skin change.     Left: No bleeding, inverted nipple, mass, nipple discharge or skin change.     Comments: Surgical scars from breast reduction surgery  Abdominal:     General: There is no distension.     Tenderness: There is no abdominal tenderness. There is no rebound.     Hernia: No hernia is present.  Musculoskeletal:     Right lower leg: No edema.     Left lower leg: No edema.  Neurological:     General: No focal deficit present.     Mental Status: She is alert and oriented to person, place, and time. Mental status is at baseline.     Cranial Nerves: Cranial nerves 2-12 are intact.     Motor: No weakness.     Coordination: Coordination is intact. Romberg sign negative. Coordination normal. Finger-Nose-Finger Test normal.     Gait: Gait is intact. Gait and tandem walk normal.     Deep Tendon Reflexes: Reflexes are normal and symmetric. Reflexes normal.  Psychiatric:        Mood and Affect: Mood normal.        Behavior: Behavior normal.        Thought Content: Thought content normal.        Judgment: Judgment normal.      No results found for any visits on 04/07/24.      Michelle Emperor, MD      [1]  Social History Tobacco Use   Smoking status: Former    Current packs/day: 0.00    Average packs/day: 0.5 packs/day for 40.0 years (20.0 ttl pk-yrs)    Types: Cigarettes    Start date: 05/1981    Quit date: 05/2021    Years since  quitting: 2.8   Smokeless tobacco: Never  Vaping Use   Vaping status: Never Used  Substance Use Topics   Alcohol use: Yes    Alcohol/week: 2.0 standard drinks of alcohol    Types: 2 Cans of beer per week    Comment: weekly   Drug use: No  [2]  Allergies Allergen  Reactions   Iodinated Contrast Media Anaphylaxis, Shortness Of Breath, Itching, Swelling and Other (See Comments)    Patient was given Omni 350 and suffered from itching, chest pain and shortness of breath. Patient was taken to ED after onset of reaction. 10/11/2021  MD zackowski noted pt had tongue and throat swelling, had to give pt epinephrine     Sulfa Antibiotics Nausea And Vomiting  [3]  Outpatient Medications Prior to Visit  Medication Sig   acetaminophen  (TYLENOL ) 325 MG tablet Take 650 mg by mouth every 6 (six) hours as needed for mild pain, moderate pain, fever or headache.   aspirin  EC 81 MG tablet Take 1 tablet (81 mg total) by mouth daily. Swallow whole.   calcium  carbonate (OS-CAL - DOSED IN MG OF ELEMENTAL CALCIUM ) 1250 (500 Ca) MG tablet Take 1 tablet by mouth 2 (two) times daily with a meal.   Cholecalciferol (VITAMIN D -3 PO) Take 1 capsule by mouth in the morning and at bedtime.   clopidogrel  (PLAVIX ) 75 MG tablet Take 1 tablet (75 mg total) by mouth daily.   dicyclomine  (BENTYL ) 10 MG capsule Take 1 capsule (10 mg total) by mouth every 12 (twelve) hours as needed (abdominal pain).   estradiol  (ESTRACE ) 2 MG tablet Take 1 tablet (2 mg total) by mouth daily.   ezetimibe  (ZETIA ) 10 MG tablet TAKE 1 TABLET BY MOUTH DAILY   Flaxseed, Linseed, (FLAX SEED OIL PO) Take 1 capsule by mouth in the morning.   fluticasone  (FLONASE ) 50 MCG/ACT nasal spray Place 1 spray into both nostrils 2 (two) times daily.   levothyroxine (SYNTHROID) 50 MCG tablet Take 50 mcg by mouth every morning.   liothyronine  (CYTOMEL ) 5 MCG tablet Take by mouth. (Patient taking differently: Take 5 mcg by mouth daily.)   losartan  (COZAAR ) 100 MG  tablet Take 1 tablet (100 mg total) by mouth daily.   metoprolol  tartrate (LOPRESSOR ) 25 MG tablet TAKE 1 TABLET BY MOUTH TWICE  DAILY   Multiple Minerals (CALCIUM -MAGNESIUM -ZINC) TABS Take 1 tablet by mouth in the morning.   progesterone  (PROMETRIUM ) 200 MG capsule Take 2 capsules (400 mg total) by mouth daily.   RABEprazole  (ACIPHEX ) 20 MG tablet Take 1 tablet (20 mg total) by mouth 2 (two) times daily before a meal.   rosuvastatin  (CRESTOR ) 40 MG tablet TAKE 1 TABLET BY MOUTH DAILY   varenicline  (CHANTIX ) 1 MG tablet Take 1 tablet (1 mg total) by mouth 2 (two) times daily.   No facility-administered medications prior to visit.

## 2024-04-08 LAB — CBC WITH DIFFERENTIAL/PLATELET
Absolute Lymphocytes: 2730 {cells}/uL (ref 850–3900)
Absolute Monocytes: 713 {cells}/uL (ref 200–950)
Basophils Absolute: 83 {cells}/uL (ref 0–200)
Basophils Relative: 1.1 %
Eosinophils Absolute: 353 {cells}/uL (ref 15–500)
Eosinophils Relative: 4.7 %
HCT: 42.5 % (ref 35.9–46.0)
Hemoglobin: 13.7 g/dL (ref 11.7–15.5)
MCH: 28.4 pg (ref 27.0–33.0)
MCHC: 32.2 g/dL (ref 31.6–35.4)
MCV: 88 fL (ref 81.4–101.7)
MPV: 10 fL (ref 7.5–12.5)
Monocytes Relative: 9.5 %
Neutro Abs: 3623 {cells}/uL (ref 1500–7800)
Neutrophils Relative %: 48.3 %
Platelets: 328 Thousand/uL (ref 140–400)
RBC: 4.83 Million/uL (ref 3.80–5.10)
RDW: 13.1 % (ref 11.0–15.0)
Total Lymphocyte: 36.4 %
WBC: 7.5 Thousand/uL (ref 3.8–10.8)

## 2024-04-08 LAB — COMPREHENSIVE METABOLIC PANEL WITH GFR
AG Ratio: 1.9 (calc) (ref 1.0–2.5)
ALT: 16 U/L (ref 6–29)
AST: 20 U/L (ref 10–35)
Albumin: 4.7 g/dL (ref 3.6–5.1)
Alkaline phosphatase (APISO): 54 U/L (ref 37–153)
BUN: 12 mg/dL (ref 7–25)
CO2: 25 mmol/L (ref 20–32)
Calcium: 9.9 mg/dL (ref 8.6–10.4)
Chloride: 105 mmol/L (ref 98–110)
Creat: 0.96 mg/dL (ref 0.50–1.05)
Globulin: 2.5 g/dL (ref 1.9–3.7)
Glucose, Bld: 105 mg/dL — ABNORMAL HIGH (ref 65–99)
Potassium: 4.4 mmol/L (ref 3.5–5.3)
Sodium: 139 mmol/L (ref 135–146)
Total Bilirubin: 0.5 mg/dL (ref 0.2–1.2)
Total Protein: 7.2 g/dL (ref 6.1–8.1)
eGFR: 65 mL/min/1.73m2 (ref >=60)

## 2024-04-08 LAB — HEMOGLOBIN A1C
Hgb A1c MFr Bld: 5.9 % — ABNORMAL HIGH (ref <5.7)
Mean Plasma Glucose: 123 mg/dL
eAG (mmol/L): 6.8 mmol/L

## 2024-04-08 LAB — LIPID PANEL
Cholesterol: 148 mg/dL (ref <200)
HDL: 72 mg/dL (ref >=50)
LDL Cholesterol (Calc): 53 mg/dL
Non-HDL Cholesterol (Calc): 76 mg/dL (ref <130)
Total CHOL/HDL Ratio: 2.1 (calc) (ref <5.0)
Triglycerides: 156 mg/dL — ABNORMAL HIGH (ref <150)

## 2024-04-08 LAB — TSH: TSH: 1.68 m[IU]/L (ref 0.40–4.50)

## 2024-04-09 ENCOUNTER — Ambulatory Visit: Payer: Self-pay | Admitting: Family Medicine

## 2024-04-29 ENCOUNTER — Telehealth: Payer: Self-pay | Admitting: Acute Care

## 2024-04-29 DIAGNOSIS — Z87891 Personal history of nicotine dependence: Secondary | ICD-10-CM

## 2024-04-29 DIAGNOSIS — Z122 Encounter for screening for malignant neoplasm of respiratory organs: Secondary | ICD-10-CM

## 2024-04-29 NOTE — Telephone Encounter (Signed)
 Lung Cancer Screening Narrative/Criteria Questionnaire (Cigarette Smokers Only- No Cigars/Pipes/vapes)   Michelle Patton   SDMV:05/02/2024 10:45 Michelle Patton     Michelle Patton, Michelle Patton   LDCT: 05/09/2024 6:00pm [facility]    68 y.o.   Phone: [phone]  Lung Screening Narrative (confirm age 60-77 yrs Medicare / 50-80 yrs Private pay insurance)   Insurance information:[employer] mcr   Referring Provider:Dr. Aletha   This screening involves an initial phone call with a team member from our program. It is called a shared decision making visit. The initial meeting is required by  insurance and Medicare to make sure you understand the program. This appointment takes about 15-20 minutes to complete. You will complete the screening scan at your scheduled date/time.  This scan takes about 5-10 minutes to complete. You can eat and drink normally before and after the scan.  Criteria questions for Lung Cancer Screening:   Are you a current or former smoker? Former Age began smoking: 69yo   If you are a former smoker, what year did you quit smoking? Quit smoking in 2024, patient quit prior to 2024 quit date for about 6 years then started back again (within 15 yrs)   To calculate your smoking history, I need an accurate estimate of how many packs of cigarettes you smoked per day and for how many years. (Not just the number of PPD you are now smoking)   Years smoking 49 x Packs per day 1 = Pack years 49   (at least 20 pack yrs)   (Make sure they understand that we need to know how much they have smoked in the past, not just the number of PPD they are smoking now)  Do you have a personal history of cancer?  No    Do you have a family history of cancer? No  Are you coughing up blood?  No  Have you had unexplained weight loss of 15 lbs or more in the last 6 months? No  It looks like you meet all criteria.  When would be a good time for us  to schedule you for this screening?   Additional information: N/A

## 2024-05-01 ENCOUNTER — Ambulatory Visit: Admitting: Orthopedic Surgery

## 2024-05-02 ENCOUNTER — Encounter: Payer: Self-pay | Admitting: Adult Health

## 2024-05-02 ENCOUNTER — Ambulatory Visit: Admitting: Adult Health

## 2024-05-02 DIAGNOSIS — Z87891 Personal history of nicotine dependence: Secondary | ICD-10-CM | POA: Diagnosis not present

## 2024-05-02 NOTE — Progress Notes (Signed)
" °  Virtual Visit via Telephone Note  I connected with Michelle Patton , 05/02/2024 10:45 AM by a telemedicine application and verified that I am speaking with the correct person using two identifiers.  Location: Patient: home Provider: home   I discussed the limitations of evaluation and management by telemedicine and the availability of in person appointments. The patient expressed understanding and agreed to proceed.   Shared Decision Making Visit Lung Cancer Screening Program [phone])   Eligibility: 69 y.o. Pack Years Smoking History Calculation = 49 pack years (# packs/per year x # years smoked) Recent History of coughing up blood  no Unexplained weight loss? no ( >Than 15 pounds within the last 6 months ) Prior History Lung / other cancer no (Diagnosis within the last 5 years already requiring surveillance chest CT Scans). Smoking Status Former Smoker Former Smokers: Years since quit: 1 year  Quit Date: 2024  Visit Components: Discussion included one or more decision making aids. YES Discussion included risk/benefits of screening. YES Discussion included potential follow up diagnostic testing for abnormal scans. YES Discussion included meaning and risk of over diagnosis. YES Discussion included meaning and risk of False Positives. YES Discussion included meaning of total radiation exposure. YES  Counseling Included: Importance of adherence to annual lung cancer LDCT screening. YES Impact of comorbidities on ability to participate in the program. YES Ability and willingness to under diagnostic treatment. YES  Smoking Cessation Counseling: Former Smokers:  Discussed the importance of maintaining cigarette abstinence. yes Diagnosis Code: Personal History of Nicotine Dependence. S12.108 Information about tobacco cessation classes and interventions provided to patient. Yes Patient provided with ticket for LDCT Scan. yes Written Order for Lung Cancer Screening with LDCT  placed in Epic. Yes (CT Chest Lung Cancer Screening Low Dose W/O CM) [id number]   Z12.2-Screening of respiratory organs Z87.891-Personal history of nicotine dependence   Michelle Patton 05/02/2024    "

## 2024-05-02 NOTE — Patient Instructions (Signed)

## 2024-05-03 ENCOUNTER — Other Ambulatory Visit: Payer: Self-pay | Admitting: Cardiology

## 2024-05-05 ENCOUNTER — Ambulatory Visit: Admitting: Orthopedic Surgery

## 2024-05-05 ENCOUNTER — Encounter: Payer: Self-pay | Admitting: Orthopedic Surgery

## 2024-05-05 ENCOUNTER — Other Ambulatory Visit (INDEPENDENT_AMBULATORY_CARE_PROVIDER_SITE_OTHER)

## 2024-05-05 VITALS — BP 136/84 | Ht 65.0 in | Wt 228.0 lb

## 2024-05-05 DIAGNOSIS — G8929 Other chronic pain: Secondary | ICD-10-CM | POA: Diagnosis not present

## 2024-05-05 DIAGNOSIS — M1711 Unilateral primary osteoarthritis, right knee: Secondary | ICD-10-CM

## 2024-05-05 DIAGNOSIS — M7051 Other bursitis of knee, right knee: Secondary | ICD-10-CM | POA: Diagnosis not present

## 2024-05-05 DIAGNOSIS — M25561 Pain in right knee: Secondary | ICD-10-CM

## 2024-05-05 MED ORDER — METHYLPREDNISOLONE ACETATE 40 MG/ML IJ SUSP
40.0000 mg | Freq: Once | INTRAMUSCULAR | Status: AC
  Administered 2024-05-05: 40 mg via INTRA_ARTICULAR

## 2024-05-05 NOTE — Progress Notes (Signed)
 "  Office Visit Note new problem   Patient: Michelle Patton           Date of Birth: 06/16/1955           MRN: [id number] Visit Date: 05/05/2024 Requested by: Aletha Bene, MD [address] [place],  [place] [address] PCP: Aletha Bene, MD   Assessment & Plan:   Encounter Diagnoses  Name Primary?   Chronic pain of right knee Yes   Primary osteoarthritis of right knee    Pes anserinus bursitis of right knee     Meds ordered this encounter  Medications   methylPREDNISolone  acetate (DEPO-MEDROL ) injection 40 mg    The arthritis that was seen on x-ray does not seem to be symptomatic but the bursitis does therefore we recommended   Injection Topical medication Ice therapy   Procedure note for injection   Chief Complaint  Patient presents with   Knee Pain    Right      Encounter Diagnoses  Name Primary?   Chronic pain of right knee Yes   Primary osteoarthritis of right knee    Pes anserinus bursitis of right knee         The patient has consented for injection of the right  Pes anserine bursa  Medication: Depo-Medrol  40 mg and lidocaine  1%  Time out completed: Yes  The site of injection was cleaned with alcohol and ethyl chloride.  The injection was given without any complications appropriate precautions were given.    Subjective: Chief Complaint  Patient presents with   Knee Pain    Right     HPI: 69 year old female presents with right knee pain for several months.  She denies any injury to the right knee.  She is anticoagulated on Plavix   She complains of pain but no the knee joint  The pain is located over the Pez anserine tendons              ROS: No effusion no swelling   Images personally read and my interpretation :  DG Knee AP/LAT W/Sunrise Right Result Date: 05/05/2024 X-rays of the right knee Chief complaint knee pain atraumatic Medial side below the joint line We do see narrowing of the medial compartment of the right knee  There is subtle bone spurs noted more visible and easily visible on the axial view of the patella Sclerosis of the subchondral bone medially This classifies as type III or grade 3 arthritis     Visit Diagnoses:  1. Chronic pain of right knee   2. Primary osteoarthritis of right knee   3. Pes anserinus bursitis of right knee      Follow-Up Instructions: Return if symptoms worsen or fail to improve.    Objective: Vital Signs: BP 136/84 Comment: 04/07/24  Ht 5' 5 (1.651 m)   Wt 228 lb (103.4 kg)   BMI 37.94 kg/m   Physical Exam Musculoskeletal:     Right knee: No effusion.      Right Knee Exam   Muscle Strength  The patient has normal right knee strength.  Tenderness  The patient is experiencing tenderness in the medial joint line and pes anserinus.  Range of Motion  Extension:  normal  Flexion:  normal   Tests  Drawer:  Anterior - negative    Posterior - negative  Other  Erythema: absent Scars: absent Sensation: normal Pulse: present Effusion: no effusion present       Specialty Comments:  No specialty  comments available.  Imaging: DG Knee AP/LAT W/Sunrise Right Result Date: 05/05/2024 X-rays of the right knee Chief complaint knee pain atraumatic Medial side below the joint line We do see narrowing of the medial compartment of the right knee There is subtle bone spurs noted more visible and easily visible on the axial view of the patella Sclerosis of the subchondral bone medially This classifies as type III or grade 3 arthritis     PMFS History: Patient Active Problem List   Diagnosis Date Noted   Anxiety 12/21/2023   Depressive disorder 12/21/2023   Difficulty sleeping 12/21/2023   History of myocardial infarction 12/21/2023   Hot flashes 12/21/2023   Iron deficiency 12/21/2023   Menopausal and female climacteric states 12/21/2023   Vitamin D  deficiency 12/21/2023   Leukocytosis 03/15/2022   Mixed hyperlipidemia 03/15/2022   Acquired  hypothyroidism 03/15/2022   Hypoalbuminemia due to protein-calorie malnutrition 03/15/2022   Body mass index (BMI) 34.0-34.9, adult 03/15/2022   Allergy, drug 01/13/2022   Prolapsed internal hemorrhoids, grade 3 12/06/2021   Left lower quadrant abdominal pain 12/06/2021   Acute anaphylaxis 10/11/2021   Mesenteric ischemia 07/12/2021   Chronic mesenteric ischemia 02/22/2021   Elevated LFTs 10/05/2020   Class 2 severe obesity due to excess calories with serious comorbidity and body mass index (BMI) of 36.0 to 36.9 in adult 08/09/2020   PAD (peripheral artery disease) 08/09/2020   Aortic valve prosthesis present 08/09/2020   Chronic systolic congestive heart failure (HCC) 08/09/2020   OSA and COPD overlap syndrome (HCC) 08/09/2020   s/p right carpal tunnel release 03/02/20 04/06/2020   Closed displaced fracture of proximal phalanx of lesser toe of right foot 12/31/2019   Carpal tunnel syndrome of right wrist 12/05/2019   Nondisplaced fracture of fifth right metatarsal bone with routine healing 10/21/2019   Low back pain without sciatica 01/20/2019   Pain of upper abdomen 01/14/2019   Fatigue 12/09/2018   Breast cancer screening 12/09/2018   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 left wrist 10/23/2018   S/P right knee arthroscopy 01/10/18 01/18/2018   Chondromalacia, patella, right    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    History of colonic polyps 12/28/2016   Right flank pain 08/10/2016   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   Supravalvular aortic stenosis, congenital - s/p repair during childhood    Essential hypertension    Aortic stenosis, severe 07/20/2014   Aortic regurgitation 07/20/2014   Leg cramps 09/26/2012   Occlusion and stenosis of carotid artery without mention of cerebral infarction 07/10/2012   Peripheral vascular disease, unspecified 06/12/2012   Carotid artery bruit 06/12/2012    Past Medical History:  Diagnosis Date   Allergy    Aortic regurgitation 07/20/2014   Aortic stenosis, severe 07/20/2014   Arthritis    CHF (congestive heart failure) (HCC)    DVT (deep venous thrombosis) (HCC)    Family history of adverse reaction to anesthesia    Reports father deliurm in his 61's with CABG   GERD (gastroesophageal reflux disease)    History of pneumonia    Hx of repair of rotator cuff    R and L rotator cuff repair   Hyperlipidemia    Hypertension    Insomnia, unspecified    patient stated d/t menopause   Mesenteric ischemia    Muscle weakness (generalized)    Peripheral artery disease    S/P redo aortic root replacement with stentless porcine aortic  root graft 10/07/2014   Redo sternotomy for 21 mm Medtronic Freestyle porcine aortic root graft w/ reimplantation of left main and right coronary arteries   Sleep apnea    diagnosed multiple years ago at [facility] aortic stenosis, congenital - s/p repair during childhood     Family History  Problem Relation Age of Onset   Hypertension Mother    Hypertension Father    Heart disease Father        before age 58   Other Father        varicose veins   COPD Father    Pulmonary embolism Sister    Colon cancer Neg Hx    Celiac disease Neg Hx    Inflammatory bowel disease Neg Hx     Past Surgical History:  Procedure Laterality Date   ABDOMINAL AORTAGRAM  06/24/2012   ABDOMINAL AORTAGRAM N/A 06/24/2012   Procedure: ABDOMINAL EZELLA;  Surgeon: Lonni GORMAN Blade, MD;  Location: [facility] CATH LAB;  Service: Cardiovascular;  Laterality: N/A;   AORTIC VALVE REPLACEMENT N/A 10/07/2014   Procedure: REDO AORTIC VALVE REPLACEMENT (AVR);  Surgeon: Sudie VEAR Laine, MD;  Location: [facility] OR;  Service: Open Heart Surgery;  Laterality: N/A;   ASCENDING AORTIC ROOT REPLACEMENT N/A 10/07/2014   Procedure: ASCENDING AORTIC ROOT REPLACEMENT;  Surgeon: Sudie VEAR Laine, MD;  Location: [facility] OR;  Service: Open Heart  Surgery;  Laterality: N/A;   BIOPSY  09/27/2016   Procedure: BIOPSY;  Surgeon: Shaaron Lamar HERO, MD;  Location: [facility] ENDO SUITE;  Service: Endoscopy;;  colon   BIOPSY  10/18/2020   Procedure: BIOPSY;  Surgeon: Cindie Carlin POUR, DO;  Location: [facility];  Service: Endoscopy;;   BREAST REDUCTION SURGERY Bilateral 01/21/2018   Procedure: BREAST REDUCTION WITH LIPOSUCTION;  Surgeon: Marcus Lung, MD;  Location:  SURGERY CENTER;  Service: Plastics;  Laterality: Bilateral;   CARDIAC VALVE SURGERY  1968   CARPAL TUNNEL RELEASE Right 03/02/2020   Procedure: CARPAL TUNNEL RELEASE;  Surgeon: Margrette Taft BRAVO, MD;  Location: [facility] ORS;  Service: Orthopedics;  Laterality: Right;   CATARACT EXTRACTION W/PHACO Left 05/30/2019   Procedure: CATARACT EXTRACTION PHACO AND INTRAOCULAR LENS PLACEMENT (IOC) (CDE: 4.94  );  Surgeon: Harrie Agent, MD;  Location: [facility] ORS;  Service: Ophthalmology;  Laterality: Left;   CATARACT EXTRACTION W/PHACO Right 06/16/2019   Procedure: CATARACT EXTRACTION PHACO AND INTRAOCULAR LENS PLACEMENT (IOC);  Surgeon: Harrie Agent, MD;  Location: [facility] ORS;  Service: Ophthalmology;  Laterality: Right;  CDE: 4.64   CERVICAL FUSION     CHOLECYSTECTOMY     COLONOSCOPY N/A 11/02/2023   Procedure: COLONOSCOPY;  Surgeon: Eartha Angelia Sieving, MD;  Location: [facility];  Service: Gastroenterology;  Laterality: N/A;  11:30 am, asa 1/2   COLONOSCOPY WITH PROPOFOL  N/A 09/27/2016   Dr. Shaaron: Diverticulosis, several tubular adenomas removed ranging 4 to 7 mm in size, internal grade 1 hemorrhoids, terminal ileum normal, segmental biopsies negative for microscopic colitis.  Next colonoscopy June 2021   COLONOSCOPY WITH PROPOFOL  N/A 05/06/2020   internal hemorrhoids, sigmoid diverticulosis. Surveillance colonoscopy due in 2027   COLONOSCOPY WITH PROPOFOL  N/A 10/06/2022   Procedure: COLONOSCOPY WITH PROPOFOL ;  Surgeon: Eartha Angelia Sieving, MD;  [facility]: [facility];   Service: Gastroenterology;  Laterality: N/A;  10:45AM;ASA 1   ESOPHAGOGASTRODUODENOSCOPY (EGD) WITH PROPOFOL  N/A 09/27/2016   Dr. Shaaron: Small hiatal hernia, mild Schatzki ring status post disruption, LA grade a esophagitis   ESOPHAGOGASTRODUODENOSCOPY (EGD) WITH PROPOFOL  N/A 10/18/2020  non-obstructing mild Schatzki ring, gastritis s/ biopsy. Negative H.pylori.   HEMOSTASIS CLIP PLACEMENT  10/06/2022   Procedure: HEMOSTASIS CLIP PLACEMENT;  Surgeon: Eartha Angelia Sieving, MD;  [facility]: [facility];  Service: Gastroenterology;;   ILIAC ARTERY STENT Left 12/2007   IR ANGIOGRAM EXTREMITY BILATERAL  07/12/2021   IR ANGIOGRAM VISCERAL SELECTIVE  10/29/2020   IR ANGIOGRAM VISCERAL SELECTIVE  07/12/2021   IR ANGIOGRAM VISCERAL SELECTIVE  03/21/2022   IR AORTAGRAM ABDOMINAL SERIALOGRAM  03/21/2022   IR IVUS EACH ADDITIONAL NON CORONARY VESSEL  07/12/2021   IR IVUS EACH ADDITIONAL NON CORONARY VESSEL  03/21/2022   IR PTA NON CORO-LOWER EXTREM  03/21/2022   IR RADIOLOGIST EVAL & MGMT  10/26/2020   IR RADIOLOGIST EVAL & MGMT  11/10/2020   IR RADIOLOGIST EVAL & MGMT  02/09/2021   IR RADIOLOGIST EVAL & MGMT  06/16/2021   IR RADIOLOGIST EVAL & MGMT  08/15/2021   IR RADIOLOGIST EVAL & MGMT  10/18/2021   IR RADIOLOGIST EVAL & MGMT  01/11/2022   IR RADIOLOGIST EVAL & MGMT  03/02/2022   IR RADIOLOGIST EVAL & MGMT  05/11/2022   IR RADIOLOGIST EVAL & MGMT  08/18/2022   IR RADIOLOGIST EVAL & MGMT  10/18/2022   IR RADIOLOGIST EVAL & MGMT  05/21/2023   IR RADIOLOGIST EVAL & MGMT  08/29/2023   IR THROMBECT PRIM MECH INIT (INCLU) MOD SED  03/21/2022   IR THROMBECT SEC MECH MOD SED  07/12/2021   IR TRANSCATH PLC STENT 1ST ART NOT LE CV CAR VERT CAR  10/29/2020   IR TRANSCATH PLC STENT 1ST ART NOT LE CV CAR VERT CAR  07/12/2021   IR US  GUIDE VASC ACCESS RIGHT  10/29/2020   IR US  GUIDE VASC ACCESS RIGHT  07/12/2021   IR US  GUIDE VASC ACCESS RIGHT  03/21/2022   KNEE ARTHROSCOPY WITH MEDIAL MENISECTOMY Right 01/10/2018    Procedure: RIGHT KNEE ARTHROSCOPY WITH PARTIAL MEDIAL MENISECTOMY;  Surgeon: Margrette Taft BRAVO, MD;  Location: [facility];  Service: Orthopedics;  Laterality: Right;   LAPAROSCOPIC APPENDECTOMY N/A 08/03/2022   Procedure: APPENDECTOMY LAPAROSCOPIC;  Surgeon: Rubin Calamity, MD;  Location: [facility] OR;  Service: General;  Laterality: N/A;   LEFT AND RIGHT HEART CATHETERIZATION WITH CORONARY ANGIOGRAM N/A 07/31/2014   Procedure: LEFT AND RIGHT HEART CATHETERIZATION WITH CORONARY ANGIOGRAM;  Surgeon: Lonni JONETTA Cash, MD;  Location: [facility] CATH LAB;  Service: Cardiovascular;  Laterality: N/A;   MALONEY DILATION N/A 09/27/2016   Procedure: AGAPITO DILATION;  Surgeon: Shaaron Lamar HERO, MD;  [facility];  Service: Endoscopy;  Laterality: N/A;   POLYPECTOMY  09/27/2016   Procedure: POLYPECTOMY;  Surgeon: Shaaron Lamar HERO, MD;  [facility];  Service: Endoscopy;;  colon   POLYPECTOMY  10/06/2022   Procedure: POLYPECTOMY;  Surgeon: Eartha Angelia Sieving, MD;  [facility];  Service: Gastroenterology;;   ROTATOR CUFF REPAIR Bilateral    SUBMUCOSAL LIFTING INJECTION  10/06/2022   Procedure: SUBMUCOSAL LIFTING INJECTION;  Surgeon: Eartha Angelia Sieving, MD;  [facility];  Service: Gastroenterology;;   TEE WITHOUT CARDIOVERSION N/A 07/07/2014   Procedure: TRANSESOPHAGEAL ECHOCARDIOGRAM (TEE);  Surgeon: Dorn JULIANNA Ross, MD;  [facility];  Service: Cardiology;  Laterality: N/A;   TEE WITHOUT CARDIOVERSION N/A 07/20/2014   Procedure: TRANSESOPHAGEAL ECHOCARDIOGRAM (TEE) WITH PROPOFOL ;  Surgeon: Dorn JULIANNA Ross, MD;  Location: [facility];  Service: Endoscopy;  Laterality: N/A;   TEE WITHOUT CARDIOVERSION N/A 10/07/2014   Procedure: TRANSESOPHAGEAL ECHOCARDIOGRAM (  TEE);  Surgeon: Sudie VEAR Laine, MD;  Location[facility] OR;  Service: Open Heart Surgery;  Laterality: N/A;   Social History   Occupational History   Occupation: call center    Employer: [employer]     Comment: [employer]  Tobacco Use   Smoking status: Former    Current packs/day: 0.00    Average packs/day: 0.5 packs/day for 40.0 years (20.0 ttl pk-yrs)    Types: Cigarettes    Start date: 05/1981    Quit date: 05/2021    Years since quitting: 2.9   Smokeless tobacco: Never  Vaping Use   Vaping status: Never Used  Substance and Sexual Activity   Alcohol use: Yes    Alcohol/week: 2.0 standard drinks of alcohol    Types: 2 Cans of beer per week    Comment: weekly   Drug use: No   Sexual activity: Yes    Birth control/protection: None       "

## 2024-05-05 NOTE — Patient Instructions (Addendum)
 Instructions for pes bursitis  Ice to the area for 20 minutes 3 times a day  Apply topical medication of choice, our recommendations are:   You can use topical medication such as Bengay, Aspercreme, Biofreeze, Voltaren  gel  You have received an injection of steroids; 15% of patients will have increased pain within the 24 hours postinjection.   This is transient and will go away.   We recommend that you use ice packs on the injection site for 20 minutes every 2 hours and extra strength Tylenol  2 tablets every 8 as needed until the pain resolves.  If you continue to have pain after taking the Tylenol  and using the ice please call the office for further instructions.

## 2024-05-05 NOTE — Progress Notes (Signed)
" °  Intake history:  Chief Complaint  Patient presents with   Knee Pain    Right      BP 136/84 Comment: 04/07/24  Ht 5' 5 (1.651 m)   Wt 228 lb (103.4 kg)   BMI 37.94 kg/m  Body mass index is 37.94 kg/m.  Pharmacy? _______[facility] Scales _______________________________  WHAT ARE WE SEEING YOU FOR TODAY?   Right knee several months   How long has this bothered you? (DOI?DOS?WS?)  Several months   Was there an injury? No  Anticoag.  Yes   Any ALLERGIES _______Allergies[1] _______________________________________   Treatment:  Have you taken:  Tylenol  Yes  Advil  No  Had PT No  Had injection No         [1]  Allergies Allergen Reactions   Iodinated Contrast Media Anaphylaxis, Shortness Of Breath, Itching, Swelling and Other (See Comments)    Patient was given Omni 350 and suffered from itching, chest pain and shortness of breath. Patient was taken to ED after onset of reaction. 10/11/2021  MD zackowski noted pt had tongue and throat swelling, had to give pt epinephrine     Sulfa Antibiotics Nausea And Vomiting   "

## 2024-05-09 ENCOUNTER — Encounter (HOSPITAL_COMMUNITY): Payer: Self-pay

## 2024-05-09 ENCOUNTER — Ambulatory Visit (HOSPITAL_COMMUNITY): Admission: RE | Admit: 2024-05-09 | Source: Ambulatory Visit

## 2024-05-13 ENCOUNTER — Ambulatory Visit: Payer: Self-pay | Admitting: Cardiology

## 2024-05-13 ENCOUNTER — Telehealth: Payer: Self-pay

## 2024-05-13 NOTE — Telephone Encounter (Signed)
 Cancelled CT on 1/16 rescheduled for 05/27/2024

## 2024-05-27 ENCOUNTER — Ambulatory Visit (HOSPITAL_COMMUNITY)
Admission: RE | Admit: 2024-05-27 | Discharge: 2024-05-27 | Disposition: A | Source: Ambulatory Visit | Attending: Acute Care | Admitting: Acute Care

## 2024-05-27 DIAGNOSIS — Z87891 Personal history of nicotine dependence: Secondary | ICD-10-CM

## 2024-05-27 DIAGNOSIS — Z122 Encounter for screening for malignant neoplasm of respiratory organs: Secondary | ICD-10-CM

## 2024-05-28 ENCOUNTER — Other Ambulatory Visit: Payer: Self-pay

## 2024-05-28 ENCOUNTER — Telehealth: Payer: Self-pay

## 2024-05-28 DIAGNOSIS — Z87891 Personal history of nicotine dependence: Secondary | ICD-10-CM

## 2024-05-28 DIAGNOSIS — Z122 Encounter for screening for malignant neoplasm of respiratory organs: Secondary | ICD-10-CM

## 2024-05-28 DIAGNOSIS — R911 Solitary pulmonary nodule: Secondary | ICD-10-CM

## 2024-05-28 NOTE — Telephone Encounter (Signed)
 Call Report Michelle Patton  IMPRESSION: 1. 7.8 mm posterior right upper lobe nodule is only questionably present on 12/27/2018 and is therefore considered new. Lung-RADS 4A, suspicious. Follow up low-dose chest CT without contrast in 3 months (please use the following order, CT CHEST LCS NODULE FOLLOW-UP W/O CM) is recommended. Alternatively, PET may be considered when there is a solid component 8mm or larger. These results will be called to the ordering clinician or representative by the Radiologist Assistant, and communication documented in the PACS or [employer]. 2. Aortic atherosclerosis (ICD10-I70.0). Coronary artery calcification. 3.  Emphysema (ICD10-J43.9).

## 2024-05-28 NOTE — Telephone Encounter (Signed)
 Three month follow up on this nodule is fine . Scan will be due after 08/23/2024.  Please fax results to PCP and let them know plan of care. CAD, on statin, followed by cardiology.  Thanks so much

## 2024-05-28 NOTE — Telephone Encounter (Signed)
 Spoke with patient and reviewed recent Lung CT results. She will complete a 3 month follow up on 08/27/2024 to evaluate a new 7.8 mm nodule for stability, order placed. Results and plan to PCP.

## 2024-07-07 ENCOUNTER — Ambulatory Visit: Admitting: Family Medicine

## 2024-08-27 ENCOUNTER — Ambulatory Visit (HOSPITAL_COMMUNITY)

## 2025-02-11 ENCOUNTER — Ambulatory Visit
